# Patient Record
Sex: Female | Born: 1935 | Race: White | Hispanic: No | Smoking: Never smoker
Health system: Southern US, Community
[De-identification: ages and names within clinical notes are randomized; demographics above are authoritative.]

## PROBLEM LIST (undated history)

## (undated) DIAGNOSIS — N183 Chronic kidney disease, stage 3 unspecified: Secondary | ICD-10-CM

## (undated) DIAGNOSIS — I10 Essential (primary) hypertension: Secondary | ICD-10-CM

## (undated) DIAGNOSIS — F0390 Unspecified dementia without behavioral disturbance: Secondary | ICD-10-CM

## (undated) DIAGNOSIS — N3281 Overactive bladder: Secondary | ICD-10-CM

## (undated) DIAGNOSIS — I5022 Chronic systolic (congestive) heart failure: Secondary | ICD-10-CM

## (undated) DIAGNOSIS — M1990 Unspecified osteoarthritis, unspecified site: Secondary | ICD-10-CM

## (undated) DIAGNOSIS — E039 Hypothyroidism, unspecified: Secondary | ICD-10-CM

---

## 2006-01-24 ENCOUNTER — Ambulatory Visit: Payer: Self-pay | Admitting: Ophthalmology

## 2006-03-16 ENCOUNTER — Ambulatory Visit: Payer: Self-pay | Admitting: Unknown Physician Specialty

## 2006-08-11 ENCOUNTER — Ambulatory Visit: Payer: Self-pay | Admitting: Unknown Physician Specialty

## 2007-08-17 ENCOUNTER — Ambulatory Visit: Payer: Self-pay | Admitting: Unknown Physician Specialty

## 2007-11-24 ENCOUNTER — Ambulatory Visit: Payer: Self-pay | Admitting: Unknown Physician Specialty

## 2008-03-05 ENCOUNTER — Ambulatory Visit: Payer: Self-pay | Admitting: Psychology

## 2008-03-05 ENCOUNTER — Encounter: Admission: RE | Admit: 2008-03-05 | Discharge: 2008-03-05 | Payer: Self-pay | Admitting: Neurology

## 2008-11-26 ENCOUNTER — Ambulatory Visit: Payer: Self-pay | Admitting: Unknown Physician Specialty

## 2009-10-07 ENCOUNTER — Encounter: Payer: Self-pay | Admitting: Otolaryngology

## 2009-10-11 ENCOUNTER — Encounter: Payer: Self-pay | Admitting: Otolaryngology

## 2009-11-11 ENCOUNTER — Encounter: Payer: Self-pay | Admitting: Otolaryngology

## 2009-11-28 ENCOUNTER — Ambulatory Visit: Payer: Self-pay | Admitting: Unknown Physician Specialty

## 2009-12-09 ENCOUNTER — Encounter: Payer: Self-pay | Admitting: Otolaryngology

## 2010-01-09 ENCOUNTER — Encounter: Payer: Self-pay | Admitting: Otolaryngology

## 2010-08-06 ENCOUNTER — Ambulatory Visit: Payer: Self-pay | Admitting: Unknown Physician Specialty

## 2010-12-01 ENCOUNTER — Ambulatory Visit: Payer: Self-pay | Admitting: Unknown Physician Specialty

## 2011-12-07 ENCOUNTER — Ambulatory Visit: Payer: Self-pay | Admitting: Unknown Physician Specialty

## 2011-12-08 ENCOUNTER — Ambulatory Visit: Payer: Self-pay | Admitting: Unknown Physician Specialty

## 2012-01-30 ENCOUNTER — Observation Stay: Payer: Self-pay | Admitting: Internal Medicine

## 2012-01-30 LAB — BASIC METABOLIC PANEL
BUN: 12 mg/dL (ref 7–18)
Calcium, Total: 9 mg/dL (ref 8.5–10.1)
Co2: 30 mmol/L (ref 21–32)
EGFR (Non-African Amer.): 56 — ABNORMAL LOW
Osmolality: 289 (ref 275–301)
Potassium: 3.9 mmol/L (ref 3.5–5.1)
Sodium: 145 mmol/L (ref 136–145)

## 2012-01-30 LAB — CBC WITH DIFFERENTIAL/PLATELET
Basophil #: 0.1 10*3/uL (ref 0.0–0.1)
Basophil %: 0.4 %
Eosinophil #: 0.1 10*3/uL (ref 0.0–0.7)
Eosinophil %: 1 %
MCHC: 32.2 g/dL (ref 32.0–36.0)
Monocyte %: 2.4 %
Neutrophil %: 85.5 %
RDW: 13.8 % (ref 11.5–14.5)
WBC: 11.8 10*3/uL — ABNORMAL HIGH (ref 3.6–11.0)

## 2012-01-31 LAB — BASIC METABOLIC PANEL
BUN: 14 mg/dL (ref 7–18)
Calcium, Total: 8.5 mg/dL (ref 8.5–10.1)
Chloride: 106 mmol/L (ref 98–107)
Co2: 27 mmol/L (ref 21–32)
Creatinine: 1.09 mg/dL (ref 0.60–1.30)
EGFR (Non-African Amer.): 49 — ABNORMAL LOW
Potassium: 4.1 mmol/L (ref 3.5–5.1)
Sodium: 141 mmol/L (ref 136–145)

## 2012-01-31 LAB — URINALYSIS, COMPLETE
Bilirubin,UR: NEGATIVE
Nitrite: NEGATIVE
Ph: 5 (ref 4.5–8.0)
Protein: NEGATIVE
RBC,UR: 21 /HPF (ref 0–5)
Squamous Epithelial: 2

## 2012-01-31 LAB — CBC WITH DIFFERENTIAL/PLATELET
Basophil #: 0 10*3/uL (ref 0.0–0.1)
Basophil %: 0.3 %
HGB: 13.4 g/dL (ref 12.0–16.0)
Lymphocyte #: 1.3 10*3/uL (ref 1.0–3.6)
MCHC: 32.5 g/dL (ref 32.0–36.0)
MCV: 85 fL (ref 80–100)
Monocyte #: 0.5 x10 3/mm (ref 0.2–0.9)
Monocyte %: 3.9 %
Neutrophil #: 11.3 10*3/uL — ABNORMAL HIGH (ref 1.4–6.5)
Neutrophil %: 84.5 %
RBC: 4.87 10*6/uL (ref 3.80–5.20)
RDW: 14.1 % (ref 11.5–14.5)
WBC: 13.3 10*3/uL — ABNORMAL HIGH (ref 3.6–11.0)

## 2012-02-01 ENCOUNTER — Encounter: Payer: Self-pay | Admitting: Internal Medicine

## 2012-02-01 ENCOUNTER — Inpatient Hospital Stay: Payer: Self-pay | Admitting: *Deleted

## 2012-02-01 LAB — BASIC METABOLIC PANEL
Anion Gap: 9 (ref 7–16)
BUN: 9 mg/dL (ref 7–18)
Calcium, Total: 8.4 mg/dL — ABNORMAL LOW (ref 8.5–10.1)
Creatinine: 0.89 mg/dL (ref 0.60–1.30)
EGFR (African American): >60
EGFR (Non-African Amer.): >60
Glucose: 121 mg/dL — ABNORMAL HIGH (ref 65–99)
Osmolality: 277 (ref 275–301)

## 2012-02-01 LAB — PRO B NATRIURETIC PEPTIDE: B-Type Natriuretic Peptide: 609 pg/mL — ABNORMAL HIGH (ref 0–450)

## 2012-02-01 LAB — CBC
MCH: 27.7 pg (ref 26.0–34.0)
MCV: 86 fL (ref 80–100)
RBC: 4.68 10*6/uL (ref 3.80–5.20)
WBC: 11.2 10*3/uL — ABNORMAL HIGH (ref 3.6–11.0)

## 2012-02-02 DIAGNOSIS — I369 Nonrheumatic tricuspid valve disorder, unspecified: Secondary | ICD-10-CM

## 2012-02-02 LAB — BASIC METABOLIC PANEL
BUN: 9 mg/dL (ref 7–18)
Creatinine: 0.8 mg/dL (ref 0.60–1.30)
EGFR (African American): >60
EGFR (Non-African Amer.): >60
Osmolality: 277 (ref 275–301)
Sodium: 139 mmol/L (ref 136–145)

## 2012-02-02 LAB — CBC WITH DIFFERENTIAL/PLATELET
Basophil #: 0.1 10*3/uL (ref 0.0–0.1)
Basophil %: 0.6 %
Eosinophil #: 0.1 10*3/uL (ref 0.0–0.7)
HCT: 37.5 % (ref 35.0–47.0)
Lymphocyte #: 1 10*3/uL (ref 1.0–3.6)
MCH: 28 pg (ref 26.0–34.0)
MCHC: 33 g/dL (ref 32.0–36.0)
Monocyte #: 0.7 x10 3/mm (ref 0.2–0.9)
Neutrophil #: 8.6 10*3/uL — ABNORMAL HIGH (ref 1.4–6.5)
Platelet: 113 10*3/uL — ABNORMAL LOW (ref 150–440)
RBC: 4.42 10*6/uL (ref 3.80–5.20)
RDW: 14.1 % (ref 11.5–14.5)
WBC: 10.4 10*3/uL (ref 3.6–11.0)

## 2012-02-02 LAB — URINE CULTURE

## 2012-02-02 LAB — TROPONIN I: Troponin-I: <0.02 ng/mL

## 2012-02-06 LAB — BASIC METABOLIC PANEL
BUN: 16 mg/dL (ref 7–18)
Co2: 29 mmol/L (ref 21–32)
Creatinine: 0.95 mg/dL (ref 0.60–1.30)
EGFR (African American): >60
EGFR (Non-African Amer.): 57 — ABNORMAL LOW
Glucose: 103 mg/dL — ABNORMAL HIGH (ref 65–99)
Osmolality: 277 (ref 275–301)
Potassium: 3.7 mmol/L (ref 3.5–5.1)

## 2012-02-07 LAB — CULTURE, BLOOD (SINGLE)

## 2012-02-09 ENCOUNTER — Encounter: Payer: Self-pay | Admitting: Internal Medicine

## 2012-03-11 ENCOUNTER — Encounter: Payer: Self-pay | Admitting: Internal Medicine

## 2012-03-24 ENCOUNTER — Ambulatory Visit: Payer: Self-pay | Admitting: Internal Medicine

## 2012-06-09 ENCOUNTER — Telehealth: Payer: Self-pay | Admitting: Internal Medicine

## 2012-06-09 NOTE — Telephone Encounter (Signed)
Pt wants to know status of her sister Toney Reil at [facility]. Has called 4 times this morning. Would like a call from Dr. Alphonsus Sias at [phone].

## 2012-06-09 NOTE — Telephone Encounter (Signed)
Thanks

## 2012-06-09 NOTE — Telephone Encounter (Signed)
Reached her on cell number given by her husband [phone] Discussed sister Scarlette Calico' condition Improved but still confused Likely not safe for her to return home without supervision Asked that she check with social worker [employer]

## 2012-07-07 ENCOUNTER — Ambulatory Visit: Payer: Self-pay | Admitting: Internal Medicine

## 2012-08-10 ENCOUNTER — Ambulatory Visit: Payer: Self-pay | Admitting: Internal Medicine

## 2013-01-26 ENCOUNTER — Ambulatory Visit: Payer: Self-pay | Admitting: Internal Medicine

## 2013-11-20 ENCOUNTER — Emergency Department: Payer: Self-pay | Admitting: Emergency Medicine

## 2013-11-20 LAB — URINALYSIS, COMPLETE
BILIRUBIN, UR: NEGATIVE
Glucose,UR: NEGATIVE mg/dL (ref 0–75)
Hyaline Cast: 18
LEUKOCYTE ESTERASE: NEGATIVE
Nitrite: NEGATIVE
PH: 5 (ref 4.5–8.0)
RBC,UR: 20 /HPF (ref 0–5)
Specific Gravity: 1.019 (ref 1.003–1.030)
WBC UR: 6 /HPF (ref 0–5)

## 2013-11-20 LAB — COMPREHENSIVE METABOLIC PANEL
ALBUMIN: 3.9 g/dL (ref 3.4–5.0)
ALK PHOS: 84 U/L
Anion Gap: 8 (ref 7–16)
BILIRUBIN TOTAL: 0.5 mg/dL (ref 0.2–1.0)
BUN: 12 mg/dL (ref 7–18)
CHLORIDE: 103 mmol/L (ref 98–107)
CREATININE: 1.28 mg/dL (ref 0.60–1.30)
Calcium, Total: 9.3 mg/dL (ref 8.5–10.1)
Co2: 25 mmol/L (ref 21–32)
EGFR (African American): 46 — ABNORMAL LOW
GFR CALC NON AF AMER: 39 — AB
GLUCOSE: 140 mg/dL — AB (ref 65–99)
Osmolality: 274 (ref 275–301)
Potassium: 3.8 mmol/L (ref 3.5–5.1)
SGOT(AST): 34 U/L (ref 15–37)
SGPT (ALT): 27 U/L (ref 12–78)
SODIUM: 136 mmol/L (ref 136–145)
TOTAL PROTEIN: 7.9 g/dL (ref 6.4–8.2)

## 2013-11-20 LAB — CBC
HCT: 44.8 % (ref 35.0–47.0)
HGB: 15 g/dL (ref 12.0–16.0)
MCH: 28.9 pg (ref 26.0–34.0)
MCHC: 33.6 g/dL (ref 32.0–36.0)
MCV: 86 fL (ref 80–100)
PLATELETS: 183 10*3/uL (ref 150–440)
RBC: 5.21 10*6/uL — ABNORMAL HIGH (ref 3.80–5.20)
RDW: 13.5 % (ref 11.5–14.5)
WBC: 8.2 10*3/uL (ref 3.6–11.0)

## 2013-11-20 LAB — TROPONIN I: Troponin-I: <0.02 ng/mL

## 2013-11-25 ENCOUNTER — Emergency Department: Payer: Self-pay | Admitting: Emergency Medicine

## 2013-12-03 ENCOUNTER — Ambulatory Visit: Payer: Self-pay | Admitting: Internal Medicine

## 2014-01-03 ENCOUNTER — Ambulatory Visit: Payer: Self-pay | Admitting: Neurology

## 2014-01-07 ENCOUNTER — Other Ambulatory Visit (HOSPITAL_COMMUNITY): Payer: Self-pay | Admitting: Neurosurgery

## 2014-01-07 DIAGNOSIS — G912 (Idiopathic) normal pressure hydrocephalus: Secondary | ICD-10-CM

## 2014-01-08 ENCOUNTER — Other Ambulatory Visit: Payer: Self-pay | Admitting: Radiology

## 2014-01-09 ENCOUNTER — Ambulatory Visit (HOSPITAL_COMMUNITY)
Admission: RE | Admit: 2014-01-09 | Discharge: 2014-01-09 | Disposition: A | Discharge status: Home / Self Care | Payer: Medicare Other | Source: Ambulatory Visit | Attending: Neurosurgery | Admitting: Neurosurgery

## 2014-01-09 ENCOUNTER — Encounter (HOSPITAL_COMMUNITY): Payer: Self-pay

## 2014-01-09 DIAGNOSIS — G912 (Idiopathic) normal pressure hydrocephalus: Secondary | ICD-10-CM

## 2014-01-09 DIAGNOSIS — R262 Difficulty in walking, not elsewhere classified: Secondary | ICD-10-CM | POA: Insufficient documentation

## 2014-01-09 MED ORDER — ACETAMINOPHEN 325 MG PO TABS
650.0000 mg | ORAL_TABLET | ORAL | Status: DC | PRN
Start: 2014-01-09 — End: 2014-01-10

## 2014-01-09 NOTE — Discharge Instructions (Signed)

## 2014-01-09 NOTE — Procedures (Signed)
Lumbar puncture performed at L3-4 without complication. Please detailed description in Radiology reporting

## 2014-01-11 ENCOUNTER — Inpatient Hospital Stay: Payer: Self-pay | Admitting: Internal Medicine

## 2014-01-11 LAB — HEPATIC FUNCTION PANEL A (ARMC)
ALBUMIN: 2.8 g/dL — AB (ref 3.4–5.0)
ALK PHOS: 132 U/L — AB
BILIRUBIN TOTAL: 0.4 mg/dL (ref 0.2–1.0)
Bilirubin, Direct: <0.1 mg/dL (ref 0.00–0.20)
SGOT(AST): 19 U/L (ref 15–37)
SGPT (ALT): 13 U/L (ref 12–78)
Total Protein: 7.1 g/dL (ref 6.4–8.2)

## 2014-01-11 LAB — CBC WITH DIFFERENTIAL/PLATELET
BASOS ABS: 0 10*3/uL (ref 0.0–0.1)
BASOS PCT: 0.3 %
EOS PCT: 0.3 %
Eosinophil #: 0 10*3/uL (ref 0.0–0.7)
HCT: 35.6 % (ref 35.0–47.0)
HGB: 11.3 g/dL — ABNORMAL LOW (ref 12.0–16.0)
LYMPHS ABS: 0.3 10*3/uL — AB (ref 1.0–3.6)
Lymphocyte %: 2.4 %
MCH: 26.1 pg (ref 26.0–34.0)
MCHC: 31.7 g/dL — ABNORMAL LOW (ref 32.0–36.0)
MCV: 82 fL (ref 80–100)
MONO ABS: 0.2 x10 3/mm (ref 0.2–0.9)
MONOS PCT: 1.3 %
Neutrophil #: 12.8 10*3/uL — ABNORMAL HIGH (ref 1.4–6.5)
Neutrophil %: 95.7 %
Platelet: 389 10*3/uL (ref 150–440)
RBC: 4.32 10*6/uL (ref 3.80–5.20)
RDW: 14.3 % (ref 11.5–14.5)
WBC: 13.4 10*3/uL — ABNORMAL HIGH (ref 3.6–11.0)

## 2014-01-11 LAB — BASIC METABOLIC PANEL
ANION GAP: 10 (ref 7–16)
BUN: 13 mg/dL (ref 7–18)
CREATININE: 1.42 mg/dL — AB (ref 0.60–1.30)
Calcium, Total: 8.6 mg/dL (ref 8.5–10.1)
Chloride: 106 mmol/L (ref 98–107)
Co2: 24 mmol/L (ref 21–32)
EGFR (African American): 40 — ABNORMAL LOW
GFR CALC NON AF AMER: 35 — AB
Glucose: 116 mg/dL — ABNORMAL HIGH (ref 65–99)
Osmolality: 280 (ref 275–301)
Potassium: 3.8 mmol/L (ref 3.5–5.1)
Sodium: 140 mmol/L (ref 136–145)

## 2014-01-11 LAB — URINALYSIS, COMPLETE
BILIRUBIN, UR: NEGATIVE
Bacteria: NONE SEEN
Glucose,UR: NEGATIVE mg/dL (ref 0–75)
KETONE: NEGATIVE
Nitrite: POSITIVE
Ph: 5 (ref 4.5–8.0)
Protein: 30
RBC,UR: 29 /HPF (ref 0–5)
Specific Gravity: 1.012 (ref 1.003–1.030)
Squamous Epithelial: NONE SEEN

## 2014-01-11 LAB — LIPASE, BLOOD: LIPASE: 101 U/L (ref 73–393)

## 2014-01-11 LAB — TROPONIN I

## 2014-01-12 ENCOUNTER — Ambulatory Visit: Payer: Self-pay | Admitting: Urology

## 2014-01-12 LAB — CBC WITH DIFFERENTIAL/PLATELET
BASOS ABS: 0.1 10*3/uL (ref 0.0–0.1)
Basophil %: 0.4 %
EOS PCT: 0.8 %
Eosinophil #: 0.1 10*3/uL (ref 0.0–0.7)
HCT: 31.3 % — ABNORMAL LOW (ref 35.0–47.0)
HGB: 10 g/dL — ABNORMAL LOW (ref 12.0–16.0)
LYMPHS ABS: 1.3 10*3/uL (ref 1.0–3.6)
LYMPHS PCT: 6.8 %
MCH: 26.4 pg (ref 26.0–34.0)
MCHC: 31.9 g/dL — ABNORMAL LOW (ref 32.0–36.0)
MCV: 83 fL (ref 80–100)
MONO ABS: 0.7 x10 3/mm (ref 0.2–0.9)
Monocyte %: 3.9 %
Neutrophil #: 16.7 10*3/uL — ABNORMAL HIGH (ref 1.4–6.5)
Neutrophil %: 88.1 %
Platelet: 378 10*3/uL (ref 150–440)
RBC: 3.78 10*6/uL — AB (ref 3.80–5.20)
RDW: 14.5 % (ref 11.5–14.5)
WBC: 19 10*3/uL — ABNORMAL HIGH (ref 3.6–11.0)

## 2014-01-13 LAB — BASIC METABOLIC PANEL
Anion Gap: 7 (ref 7–16)
BUN: 14 mg/dL (ref 7–18)
CALCIUM: 7.6 mg/dL — AB (ref 8.5–10.1)
Chloride: 108 mmol/L — ABNORMAL HIGH (ref 98–107)
Co2: 26 mmol/L (ref 21–32)
Creatinine: 1.55 mg/dL — ABNORMAL HIGH (ref 0.60–1.30)
EGFR (African American): 36 — ABNORMAL LOW
GFR CALC NON AF AMER: 31 — AB
Glucose: 81 mg/dL (ref 65–99)
Osmolality: 281 (ref 275–301)
POTASSIUM: 3 mmol/L — AB (ref 3.5–5.1)
Sodium: 141 mmol/L (ref 136–145)

## 2014-01-13 LAB — CBC WITH DIFFERENTIAL/PLATELET
BASOS ABS: 0.1 10*3/uL (ref 0.0–0.1)
Basophil %: 0.3 %
EOS PCT: 2.1 %
Eosinophil #: 0.3 10*3/uL (ref 0.0–0.7)
HCT: 28.3 % — ABNORMAL LOW (ref 35.0–47.0)
HGB: 9.2 g/dL — AB (ref 12.0–16.0)
LYMPHS ABS: 1.6 10*3/uL (ref 1.0–3.6)
Lymphocyte %: 10.2 %
MCH: 26.4 pg (ref 26.0–34.0)
MCHC: 32.5 g/dL (ref 32.0–36.0)
MCV: 81 fL (ref 80–100)
MONO ABS: 0.8 x10 3/mm (ref 0.2–0.9)
Monocyte %: 5.3 %
NEUTROS ABS: 12.8 10*3/uL — AB (ref 1.4–6.5)
NEUTROS PCT: 82.1 %
PLATELETS: 317 10*3/uL (ref 150–440)
RBC: 3.48 10*6/uL — ABNORMAL LOW (ref 3.80–5.20)
RDW: 14.7 % — ABNORMAL HIGH (ref 11.5–14.5)
WBC: 15.6 10*3/uL — AB (ref 3.6–11.0)

## 2014-01-13 LAB — CLOSTRIDIUM DIFFICILE(ARMC)

## 2014-01-13 LAB — URINE CULTURE

## 2014-01-14 LAB — CBC WITH DIFFERENTIAL/PLATELET
BASOS PCT: 1.1 %
Basophil #: 0.1 10*3/uL (ref 0.0–0.1)
EOS PCT: 4.2 %
Eosinophil #: 0.5 10*3/uL (ref 0.0–0.7)
HCT: 31.1 % — ABNORMAL LOW (ref 35.0–47.0)
HGB: 10.1 g/dL — AB (ref 12.0–16.0)
LYMPHS PCT: 11.2 %
Lymphocyte #: 1.5 10*3/uL (ref 1.0–3.6)
MCH: 26.7 pg (ref 26.0–34.0)
MCHC: 32.6 g/dL (ref 32.0–36.0)
MCV: 82 fL (ref 80–100)
MONO ABS: 0.8 x10 3/mm (ref 0.2–0.9)
Monocyte %: 6.2 %
NEUTROS PCT: 77.3 %
Neutrophil #: 10.1 10*3/uL — ABNORMAL HIGH (ref 1.4–6.5)
PLATELETS: 349 10*3/uL (ref 150–440)
RBC: 3.8 10*6/uL (ref 3.80–5.20)
RDW: 14.8 % — AB (ref 11.5–14.5)
WBC: 13.1 10*3/uL — ABNORMAL HIGH (ref 3.6–11.0)

## 2014-01-14 LAB — BASIC METABOLIC PANEL
Anion Gap: 5 — ABNORMAL LOW (ref 7–16)
BUN: 7 mg/dL (ref 7–18)
CREATININE: 1.07 mg/dL (ref 0.60–1.30)
Calcium, Total: 7.6 mg/dL — ABNORMAL LOW (ref 8.5–10.1)
Chloride: 108 mmol/L — ABNORMAL HIGH (ref 98–107)
Co2: 26 mmol/L (ref 21–32)
EGFR (African American): 57 — ABNORMAL LOW
GFR CALC NON AF AMER: 49 — AB
GLUCOSE: 98 mg/dL (ref 65–99)
OSMOLALITY: 275 (ref 275–301)
POTASSIUM: 2.9 mmol/L — AB (ref 3.5–5.1)
SODIUM: 139 mmol/L (ref 136–145)

## 2014-01-15 LAB — BASIC METABOLIC PANEL
ANION GAP: 6 — AB (ref 7–16)
BUN: 4 mg/dL — ABNORMAL LOW (ref 7–18)
CHLORIDE: 108 mmol/L — AB (ref 98–107)
CREATININE: 0.96 mg/dL (ref 0.60–1.30)
Calcium, Total: 7.9 mg/dL — ABNORMAL LOW (ref 8.5–10.1)
Co2: 25 mmol/L (ref 21–32)
EGFR (African American): >60
GFR CALC NON AF AMER: 56 — AB
Glucose: 101 mg/dL — ABNORMAL HIGH (ref 65–99)
Osmolality: 275 (ref 275–301)
Potassium: 3.5 mmol/L (ref 3.5–5.1)
SODIUM: 139 mmol/L (ref 136–145)

## 2014-01-16 LAB — CULTURE, BLOOD (SINGLE)

## 2014-01-17 ENCOUNTER — Encounter: Payer: Self-pay | Admitting: Internal Medicine

## 2014-01-17 LAB — CBC WITH DIFFERENTIAL/PLATELET
BASOS ABS: 0.2 10*3/uL — AB (ref 0.0–0.1)
BASOS PCT: 0.8 %
EOS ABS: 0.5 10*3/uL (ref 0.0–0.7)
Eosinophil %: 2.7 %
HCT: 36.8 % (ref 35.0–47.0)
HGB: 11.8 g/dL — AB (ref 12.0–16.0)
LYMPHS ABS: 2.2 10*3/uL (ref 1.0–3.6)
Lymphocyte %: 11.6 %
MCH: 25.9 pg — AB (ref 26.0–34.0)
MCHC: 32.2 g/dL (ref 32.0–36.0)
MCV: 81 fL (ref 80–100)
Monocyte #: 1.1 x10 3/mm — ABNORMAL HIGH (ref 0.2–0.9)
Monocyte %: 5.7 %
Neutrophil #: 15.2 10*3/uL — ABNORMAL HIGH (ref 1.4–6.5)
Neutrophil %: 79.2 %
PLATELETS: 438 10*3/uL (ref 150–440)
RBC: 4.57 10*6/uL (ref 3.80–5.20)
RDW: 14.7 % — ABNORMAL HIGH (ref 11.5–14.5)
WBC: 19.2 10*3/uL — ABNORMAL HIGH (ref 3.6–11.0)

## 2014-01-17 LAB — BASIC METABOLIC PANEL
Anion Gap: 5 — ABNORMAL LOW (ref 7–16)
BUN: 7 mg/dL (ref 7–18)
Calcium, Total: 8.7 mg/dL (ref 8.5–10.1)
Chloride: 106 mmol/L (ref 98–107)
Co2: 26 mmol/L (ref 21–32)
Creatinine: 1.23 mg/dL (ref 0.60–1.30)
EGFR (African American): 48 — ABNORMAL LOW
GFR CALC NON AF AMER: 41 — AB
GLUCOSE: 132 mg/dL — AB (ref 65–99)
Osmolality: 274 (ref 275–301)
POTASSIUM: 3.5 mmol/L (ref 3.5–5.1)
Sodium: 137 mmol/L (ref 136–145)

## 2014-01-28 LAB — URINALYSIS, COMPLETE
Bacteria: NONE SEEN
Bilirubin,UR: NEGATIVE
Glucose,UR: NEGATIVE mg/dL (ref 0–75)
KETONE: NEGATIVE
Nitrite: NEGATIVE
Ph: 5 (ref 4.5–8.0)
Protein: 100
RBC,UR: 1215 /HPF (ref 0–5)
SPECIFIC GRAVITY: 1.018 (ref 1.003–1.030)
Squamous Epithelial: NONE SEEN

## 2014-01-30 LAB — URINE CULTURE

## 2014-03-14 ENCOUNTER — Encounter: Payer: Self-pay | Admitting: Internal Medicine

## 2014-04-10 ENCOUNTER — Encounter: Payer: Self-pay | Admitting: Internal Medicine

## 2014-05-11 ENCOUNTER — Encounter: Payer: Self-pay | Admitting: Internal Medicine

## 2014-05-16 ENCOUNTER — Inpatient Hospital Stay: Payer: Self-pay | Admitting: Internal Medicine

## 2014-05-16 LAB — COMPREHENSIVE METABOLIC PANEL
ALK PHOS: 101 U/L
Albumin: 3.4 g/dL (ref 3.4–5.0)
Anion Gap: 4 — ABNORMAL LOW (ref 7–16)
BUN: 16 mg/dL (ref 7–18)
Bilirubin,Total: 0.6 mg/dL (ref 0.2–1.0)
CALCIUM: 8.8 mg/dL (ref 8.5–10.1)
Chloride: 107 mmol/L (ref 98–107)
Co2: 29 mmol/L (ref 21–32)
Creatinine: 1.07 mg/dL (ref 0.60–1.30)
EGFR (African American): 57 — ABNORMAL LOW
EGFR (Non-African Amer.): 49 — ABNORMAL LOW
Glucose: 92 mg/dL (ref 65–99)
Osmolality: 280 (ref 275–301)
POTASSIUM: 3.9 mmol/L (ref 3.5–5.1)
SGOT(AST): 21 U/L (ref 15–37)
SGPT (ALT): 18 U/L
SODIUM: 140 mmol/L (ref 136–145)
Total Protein: 7.2 g/dL (ref 6.4–8.2)

## 2014-05-16 LAB — URINALYSIS, COMPLETE
Bilirubin,UR: NEGATIVE
GLUCOSE, UR: NEGATIVE mg/dL (ref 0–75)
Ketone: NEGATIVE
Nitrite: POSITIVE
PROTEIN: NEGATIVE
Ph: 6 (ref 4.5–8.0)
RBC,UR: 4 /HPF (ref 0–5)
Specific Gravity: 1.01 (ref 1.003–1.030)
Squamous Epithelial: <1
WBC UR: 21 /HPF (ref 0–5)

## 2014-05-16 LAB — CBC
HCT: 42.3 % (ref 35.0–47.0)
HGB: 13.7 g/dL (ref 12.0–16.0)
MCH: 26.6 pg (ref 26.0–34.0)
MCHC: 32.4 g/dL (ref 32.0–36.0)
MCV: 82 fL (ref 80–100)
Platelet: 215 10*3/uL (ref 150–440)
RBC: 5.14 10*6/uL (ref 3.80–5.20)
RDW: 14.7 % — ABNORMAL HIGH (ref 11.5–14.5)
WBC: 8.1 10*3/uL (ref 3.6–11.0)

## 2014-05-16 LAB — APTT: ACTIVATED PTT: 30.8 s (ref 23.6–35.9)

## 2014-05-16 LAB — PROTIME-INR
INR: 1.1
Prothrombin Time: 13.6 secs (ref 11.5–14.7)

## 2014-05-16 LAB — TROPONIN I: Troponin-I: <0.02 ng/mL

## 2014-05-17 LAB — CBC WITH DIFFERENTIAL/PLATELET
BASOS ABS: 0.1 10*3/uL (ref 0.0–0.1)
Basophil %: 0.7 %
Eosinophil #: 0.4 10*3/uL (ref 0.0–0.7)
Eosinophil %: 3.7 %
HCT: 40.8 % (ref 35.0–47.0)
HGB: 13.3 g/dL (ref 12.0–16.0)
Lymphocyte #: 2.7 10*3/uL (ref 1.0–3.6)
Lymphocyte %: 24.3 %
MCH: 26.7 pg (ref 26.0–34.0)
MCHC: 32.7 g/dL (ref 32.0–36.0)
MCV: 82 fL (ref 80–100)
Monocyte #: 0.7 x10 3/mm (ref 0.2–0.9)
Monocyte %: 6.4 %
Neutrophil #: 7.3 10*3/uL — ABNORMAL HIGH (ref 1.4–6.5)
Neutrophil %: 64.9 %
PLATELETS: 201 10*3/uL (ref 150–440)
RBC: 4.99 10*6/uL (ref 3.80–5.20)
RDW: 14.7 % — ABNORMAL HIGH (ref 11.5–14.5)
WBC: 11.3 10*3/uL — ABNORMAL HIGH (ref 3.6–11.0)

## 2014-05-17 LAB — BASIC METABOLIC PANEL
Anion Gap: 11 (ref 7–16)
BUN: 15 mg/dL (ref 7–18)
CHLORIDE: 105 mmol/L (ref 98–107)
CO2: 26 mmol/L (ref 21–32)
Calcium, Total: 8.6 mg/dL (ref 8.5–10.1)
Creatinine: 1.16 mg/dL (ref 0.60–1.30)
EGFR (African American): 51 — ABNORMAL LOW
EGFR (Non-African Amer.): 44 — ABNORMAL LOW
Glucose: 100 mg/dL — ABNORMAL HIGH (ref 65–99)
Osmolality: 284 (ref 275–301)
POTASSIUM: 3.5 mmol/L (ref 3.5–5.1)
Sodium: 142 mmol/L (ref 136–145)

## 2014-05-17 LAB — TROPONIN I

## 2014-05-18 LAB — COMPREHENSIVE METABOLIC PANEL
ALBUMIN: 3 g/dL — AB (ref 3.4–5.0)
ALK PHOS: 91 U/L
ALT: 16 U/L
ANION GAP: 7 (ref 7–16)
AST: 17 U/L (ref 15–37)
BUN: 16 mg/dL (ref 7–18)
Bilirubin,Total: 0.6 mg/dL (ref 0.2–1.0)
CHLORIDE: 106 mmol/L (ref 98–107)
CREATININE: 1.16 mg/dL (ref 0.60–1.30)
Calcium, Total: 8.2 mg/dL — ABNORMAL LOW (ref 8.5–10.1)
Co2: 26 mmol/L (ref 21–32)
EGFR (African American): 51 — ABNORMAL LOW
GFR CALC NON AF AMER: 44 — AB
Glucose: 112 mg/dL — ABNORMAL HIGH (ref 65–99)
OSMOLALITY: 279 (ref 275–301)
Potassium: 3.4 mmol/L — ABNORMAL LOW (ref 3.5–5.1)
Sodium: 139 mmol/L (ref 136–145)
Total Protein: 6.6 g/dL (ref 6.4–8.2)

## 2014-05-18 LAB — CBC WITH DIFFERENTIAL/PLATELET
BASOS ABS: 0.1 10*3/uL (ref 0.0–0.1)
Basophil %: 0.5 %
Eosinophil #: 0.2 10*3/uL (ref 0.0–0.7)
Eosinophil %: 2.4 %
HCT: 40.8 % (ref 35.0–47.0)
HGB: 13.4 g/dL (ref 12.0–16.0)
Lymphocyte #: 2.3 10*3/uL (ref 1.0–3.6)
Lymphocyte %: 23.4 %
MCH: 27.2 pg (ref 26.0–34.0)
MCHC: 32.9 g/dL (ref 32.0–36.0)
MCV: 83 fL (ref 80–100)
Monocyte #: 0.8 x10 3/mm (ref 0.2–0.9)
Monocyte %: 7.9 %
Neutrophil #: 6.5 10*3/uL (ref 1.4–6.5)
Neutrophil %: 65.8 %
Platelet: 185 10*3/uL (ref 150–440)
RBC: 4.93 10*6/uL (ref 3.80–5.20)
RDW: 14.5 % (ref 11.5–14.5)
WBC: 9.8 10*3/uL (ref 3.6–11.0)

## 2014-05-21 ENCOUNTER — Observation Stay: Payer: Self-pay | Admitting: Internal Medicine

## 2014-05-21 LAB — CBC WITH DIFFERENTIAL/PLATELET
Basophil #: 0.1 10*3/uL (ref 0.0–0.1)
Basophil %: 0.7 %
Eosinophil #: 0.4 10*3/uL (ref 0.0–0.7)
Eosinophil %: 3.6 %
HCT: 40.3 % (ref 35.0–47.0)
HGB: 12.8 g/dL (ref 12.0–16.0)
Lymphocyte #: 1.9 10*3/uL (ref 1.0–3.6)
Lymphocyte %: 18.6 %
MCH: 26.6 pg (ref 26.0–34.0)
MCHC: 31.8 g/dL — ABNORMAL LOW (ref 32.0–36.0)
MCV: 84 fL (ref 80–100)
Monocyte #: 0.9 x10 3/mm (ref 0.2–0.9)
Monocyte %: 8.5 %
Neutrophil #: 6.9 10*3/uL — ABNORMAL HIGH (ref 1.4–6.5)
Neutrophil %: 68.6 %
Platelet: 176 10*3/uL (ref 150–440)
RBC: 4.82 10*6/uL (ref 3.80–5.20)
RDW: 14.1 % (ref 11.5–14.5)
WBC: 10 10*3/uL (ref 3.6–11.0)

## 2014-05-21 LAB — BASIC METABOLIC PANEL
Anion Gap: 6 — ABNORMAL LOW (ref 7–16)
BUN: 23 mg/dL — ABNORMAL HIGH (ref 7–18)
CHLORIDE: 110 mmol/L — AB (ref 98–107)
Calcium, Total: 8.1 mg/dL — ABNORMAL LOW (ref 8.5–10.1)
Co2: 27 mmol/L (ref 21–32)
Creatinine: 1.17 mg/dL (ref 0.60–1.30)
EGFR (Non-African Amer.): 44 — ABNORMAL LOW
GFR CALC AF AMER: 51 — AB
Glucose: 82 mg/dL (ref 65–99)
Osmolality: 288 (ref 275–301)
Potassium: 4 mmol/L (ref 3.5–5.1)
SODIUM: 143 mmol/L (ref 136–145)

## 2014-05-21 LAB — TROPONIN I: Troponin-I: <0.02 ng/mL

## 2014-05-22 LAB — LIPID PANEL
Cholesterol: 123 mg/dL (ref 0–200)
HDL: 35 mg/dL — AB (ref 40–60)
LDL CHOLESTEROL, CALC: 65 mg/dL (ref 0–100)
Triglycerides: 116 mg/dL (ref 0–200)
VLDL Cholesterol, Calc: 23 mg/dL (ref 5–40)

## 2014-05-22 LAB — CBC WITH DIFFERENTIAL/PLATELET
BASOS ABS: 0.1 10*3/uL (ref 0.0–0.1)
BASOS PCT: 1.2 %
EOS PCT: 6.2 %
Eosinophil #: 0.5 10*3/uL (ref 0.0–0.7)
HCT: 36.6 % (ref 35.0–47.0)
HGB: 11.7 g/dL — AB (ref 12.0–16.0)
LYMPHS PCT: 31 %
Lymphocyte #: 2.5 10*3/uL (ref 1.0–3.6)
MCH: 26.6 pg (ref 26.0–34.0)
MCHC: 32 g/dL (ref 32.0–36.0)
MCV: 83 fL (ref 80–100)
Monocyte #: 0.8 x10 3/mm (ref 0.2–0.9)
Monocyte %: 9.6 %
Neutrophil #: 4.1 10*3/uL (ref 1.4–6.5)
Neutrophil %: 52 %
Platelet: 171 10*3/uL (ref 150–440)
RBC: 4.41 10*6/uL (ref 3.80–5.20)
RDW: 14.6 % — ABNORMAL HIGH (ref 11.5–14.5)
WBC: 8 10*3/uL (ref 3.6–11.0)

## 2014-05-22 LAB — BASIC METABOLIC PANEL
ANION GAP: 6 — AB (ref 7–16)
BUN: 21 mg/dL — AB (ref 7–18)
CALCIUM: 8 mg/dL — AB (ref 8.5–10.1)
CHLORIDE: 110 mmol/L — AB (ref 98–107)
CREATININE: 1.1 mg/dL (ref 0.60–1.30)
Co2: 28 mmol/L (ref 21–32)
EGFR (African American): 55 — ABNORMAL LOW
EGFR (Non-African Amer.): 47 — ABNORMAL LOW
Glucose: 94 mg/dL (ref 65–99)
OSMOLALITY: 290 (ref 275–301)
POTASSIUM: 3.9 mmol/L (ref 3.5–5.1)
Sodium: 144 mmol/L (ref 136–145)

## 2014-05-22 LAB — URINALYSIS, COMPLETE
BILIRUBIN, UR: NEGATIVE
Bacteria: NONE SEEN
Glucose,UR: NEGATIVE mg/dL (ref 0–75)
Hyaline Cast: 2
Ketone: NEGATIVE
Nitrite: NEGATIVE
PH: 5 (ref 4.5–8.0)
PROTEIN: NEGATIVE
Specific Gravity: 1.021 (ref 1.003–1.030)
WBC UR: NONE SEEN /HPF (ref 0–5)

## 2014-05-23 ENCOUNTER — Encounter: Payer: Self-pay | Admitting: Internal Medicine

## 2014-05-23 ENCOUNTER — Ambulatory Visit: Payer: Self-pay | Admitting: Neurology

## 2014-06-11 ENCOUNTER — Encounter: Payer: Self-pay | Admitting: Internal Medicine

## 2014-06-13 DIAGNOSIS — F0150 Vascular dementia without behavioral disturbance: Secondary | ICD-10-CM | POA: Insufficient documentation

## 2014-06-13 DIAGNOSIS — F0280 Dementia in other diseases classified elsewhere without behavioral disturbance: Secondary | ICD-10-CM | POA: Insufficient documentation

## 2015-02-01 NOTE — Discharge Summary (Signed)
PATIENT NAME:  Kerri [name], Kerri [name] MR#:  [id number] DATE OF BIRTH:  [birth year]  DATE OF ADMISSION:  05/21/2014 DATE OF DISCHARGE:  05/23/2014  DISCHARGE DIAGNOSES:  1.  Normal pressure hydrocephalus with recurrent syncope.  2.  Mild dementia.  3.  Urinary incontinence.  4.  Hyperlipidemia.   DISCHARGE MEDICATIONS: Pravastatin 20 mg at bedtime, citalopram 40 mg daily, calcium with vitamin D b.i.d., folate 1 mg daily, Aricept 5 mg at bedtime, Robaxin 500 mg t.i.d., Aleve 2 daily. Oxybutynin 5 mg daily, topical Voltaren daily.   REASON FOR ADMISSION: The patient is an 81 year old female who presents with recurrent syncope. Please see H and P for history of present illness, past medical history, and physical exam.   HOSPITAL COURSE: The patient was admitted. EEG was negative for epileptiform discharges and her pacemaker interrogation was normal. Neurology evaluation is pending. She will be transferred to skilled nursing with her instability with plans [facility] next [facility].    ____________________________ Danella PentonMark F. Apurva Reily, MD mfm:lt D: 05/23/2014 07:30:58 ET T: 05/23/2014 08:03:59 ET JOB#: [id number]  cc: Danella PentonMark F. Iolanda Folson, MD, <Dictator> Breigh Annett Sherlene ShamsF Demetry Bendickson MD ELECTRONICALLY SIGNED 05/23/2014 8:30

## 2015-02-01 NOTE — Consult Note (Signed)
PATIENT NAME:  Kerri [name], Kerri [name] MR#:  [id number] DATE OF BIRTH:  [birth year]  DATE OF CONSULTATION:  05/21/2014  REFERRING PHYSICIAN:   CONSULTING PHYSICIAN:  Lamar BlinksBruce J. Jonavan Vanhorn, MD  CONSULTING PHYSICIAN: Dr. Renae GlossWieting.   REASON FOR CONSULTATION: Syncope, hyperlipidemia, and normal pressure hydrocephalus.    CHIEF COMPLAINT: The patient passed out.   HISTORY OF PRESENT ILLNESS: This is an 81 year old female with normal pressure hydrocephalus with weakness and fatigue off and on over the last many years with classic symptoms. The patient recently has been admitted for episodes of syncopal episodes occurring with some clonic type movements. The patient has not hurt herself with these syncopal episodes, and there was some apparent bradycardia. The patient had a dual-chamber pacemaker placement which has worked fairly well and has not had these issues, although she had another syncopal episode for which she was walking into the bathroom and did go to the ground. She did not hurt herself at that time and she did have clonic type symptoms, but did retain her composure fairly quickly. The patient then was brought here with an EKG showing a pacemaker being normal. EKG with normal troponin and no evidence of hypotension. She is feeling well at this time.   REVIEW OF SYSTEMS: The remainder of the review of systems not easily obtainable due to patient's difficulty in conversation.   PAST MEDICAL HISTORY:  1.  Normal pressure hydrocephalus. 2.  Hyperlipidemia.  3.  Bradycardia.   FAMILY HISTORY: No family members with early onset of cardiovascular disease or hypertension.   SOCIAL HISTORY: The patient currently denies alcohol or tobacco use.   ALLERGIES: As listed.   MEDICATIONS: As listed.   PHYSICAL EXAMINATION: VITAL SIGNS: Blood pressure is 126/58 bilaterally. Heart rate is 70, upright, reclining, and regular.  GENERAL: She is a well-appearing female in no acute distress.  HEAD, EYES, EARS, NOSE,  AND THROAT: No icterus, thyromegaly, ulcers, hemorrhage, or xanthelasma.  CARDIOVASCULAR: Regular rate and rhythm. Normal S1 and S2 without murmur, gallop, or rub. PMI is normal size and placement. Carotid upstroke normal without bruit. Jugular venous pressure is normal.  LUNGS: Clear to auscultation with normal respirations.  ABDOMEN: Soft, nontender, without hepatosplenomegaly or masses. Abdominal aorta is normal size without bruit.  EXTREMITIES: Show 2+ bilateral pulses in dorsal, pedal, radial and femoral arteries without lower extremity edema, cyanosis, clubbing, or ulcers.  NEUROLOGIC: She is oriented at this time.   ASSESSMENT: An 81 year old female with normal pressure hydrocephalus, hyperlipidemia, bradycardia status post pacemaker placement with recurrent syncope of unknown etiology.   RECOMMENDATIONS: 1.  Telemetry. Watching for telemetry unit changes and/or bradycardia.  2.  Possible pacemaker interrogation.  3.  Neurologic consultation for possible normal pressure hydrocephalus symptoms.  4.  Orthostatic assessment for orthostatic hypotension.  5.  Ambulate and follow for other improvement.   ____________________________ Lamar BlinksBruce J. Yadier Bramhall, MD bjk:at D: 05/21/2014 17:48:12 ET T: 05/21/2014 19:04:03 ET JOB#: [id number]  cc: Lamar BlinksBruce J. Rowyn Spilde, MD, <Dictator> Lamar BlinksBRUCE J Rodolfo Gaster MD ELECTRONICALLY SIGNED 05/22/2014 10:19

## 2015-02-01 NOTE — H&P (Signed)
PATIENT NAME:  Kerri [name], Kerri [name] MR#:  [id number] DATE OF BIRTH:  1931/05/12  DATE OF ADMISSION:  01/11/2014  PRIMARY CARE PHYSICIAN: Dr. Leotis ShamesJasmine Singh.   CHIEF COMPLAINT: Nausea, vomiting, and chills.   HISTORY OF PRESENT ILLNESS: This is an 81 year old female who presents to the Emergency Room due to nausea, vomiting, and chills started overnight. The patient has had maybe 5 or 6 episodes of bilious, nonbloody vomiting. She has pretty much vomited up her food and pills she ate this morning. She started to have some chills, but no documented fever. Because her symptoms were not improving, she came to the ER for further evaluation. The patient did not have any documented fever. She denies any diarrhea. She does complain of some lower abdominal pain. On blood work, the patient was noted to have a mild leukocytosis. Also noted to have a urinary tract infection. Hospitalist services were contacted for further treatment and evaluation.   REVIEW OF SYSTEMS:    CONSTITUTIONAL: No documented fever. No weight gain. No weight loss. Positive weakness.  EYES: No blurred or double vision.  EARS, NOSE, THROAT: No tinnitus. No postnasal drip. No redness of the oropharynx.  RESPIRATORY: No cough, no wheeze, no hemoptysis, no dyspnea.  CARDIOVASCULAR: No chest pain, no orthopnea, no palpitations, no syncope.  GASTROINTESTINAL: Positive nausea. Positive vomiting. No diarrhea. No abdominal pain. No melena or hematochezia.  GENITOURINARY: No dysuria or hematuria.  ENDOCRINE: No polyuria, nocturia, heat or cold intolerance.  HEMATOLOGIC: No anemia, no bruising, no bleeding.  INTEGUMENTARY: No rashes. No lesions.  MUSCULOSKELETAL: No arthritis. No swelling. No gout.  NEUROLOGIC: No numbness or tingling. No ataxia. No seizure-type activity.  PSYCHIATRIC: No anxiety. No insomnia. No ADD.   PAST MEDICAL HISTORY: Consistent with hyperlipidemia, depression, a recent diagnosis of normal-pressure hydrocephalus, history  of pelvic fracture, osteoporosis, hypertension.   ALLERGIES: No known drug allergies.   SOCIAL HISTORY: No smoking. No alcohol abuse. No illicit drug abuse. Lives at home with her husband.   FAMILY HISTORY: Both mother and father are deceased. Mother died from bone cancer. Father died as he was killed in an accident.   CURRENT MEDICATIONS: As follows: Aleve 240 mg 2 tabs daily, calcium with vitamin D 1 tab b.i.d., Celexa 40 mg daily, Aricept 5 mg at bedtime, folic acid 1 mg daily, methocarbamol 500 mg t.i.d., Pravachol 20 mg daily, Tylenol with codeine 1 tab q.6 hours as needed, Voltaren gel to be applied to the affected area.   PHYSICAL EXAMINATION: Presently is as follows:  VITAL SIGNS: Temperature 98.3, pulse 82, respirations 18, blood pressure 107/62, sats 94% on room air.  GENERAL: She is a pleasant-appearing female in no apparent distress.  HEAD, EYES, EARS, NOSE, AND THROAT: Atraumatic, normocephalic. Extraocular muscles are intact. Pupils are equal and reactive to light. Sclerae anicteric. No conjunctival injection. No pharyngeal erythema.  NECK: Supple. There is jugular venous distention. No bruits, no lymphadenopathy, no thyromegaly.  HEART: Regular rate and rhythm. No murmurs, no rubs, no clicks.  LUNGS: Clear to auscultation bilaterally. No rales. No rhonchi. No wheezes.  ABDOMEN: Soft, flat, nontender, nondistended. Has good bowel sounds. No hepatosplenomegaly appreciated.  EXTREMITIES: No evidence of any cyanosis, clubbing, or peripheral edema. Has +2 pedal and radial pulses bilaterally.  NEUROLOGIC: The patient is alert, awake, oriented x 3 with no focal motor or sensory deficits appreciated bilaterally.  SKIN: Moist and warm with no rashes appreciated.  LYMPHATIC: There is no cervical or axillary lymphadenopathy.   LABORATORY AND RADIOLOGICAL DATA:  Showed a serum glucose of 116, BUN 13, creatinine 1.42, sodium 140, potassium 3.8, chloride 106, bicarbonate 24. The patient's LFTs  are within normal limits. Troponin less than 0.02. White cell count 13.4, hemoglobin 11.3, hematocrit 35.6, platelet count 389. Urinalysis shows positive nitrites, 3+ leukocyte esterase with 168 white cells.   The patient did have an abdomen 3-way done, which showed no acute cardiopulmonary disease and negative abdominal radiographs.   ASSESSMENT AND PLAN: This is an 82 year old female with a history of a normal-pressure hydrocephalus, hyperlipidemia, depression, history of pelvic fracture, osteoporosis, who presents to the hospital due to nausea, vomiting, and chills and noted to have abnormal urinalysis consistent with genitourinary/gastrointestinal. 1.  Urinary tract infection: This is likely the cause of the patient's nausea, vomiting, and chills. I will treat her with IV ceftriaxone, follow urine and blood cultures.  2.  Nausea, vomiting: Likely related to the underlying urinary tract infection. Treat her with IV antibiotics for the urinary tract infection and continue supportive care with IV fluids and antiemetics.  3.  Leukocytosis: Likely secondary to the urinary tract infection. Will treat with IV antibiotics and follow her white cell count.  4.  History of normal-pressure hydrocephalus: This was recently diagnosed. The patient had a lumbar tap just done only a few days ago. She follows with Dr. Cristopher Peru and has been referred to a neurosurgeon for possible drain placement. No acute issue related to this at this time.  5.  Depression: Continue Celexa.  6.  Hyperlipidemia: Continue Pravachol.  7.  Osteoporosis: Continue with calcium and vitamin D supplements.   CODE STATUS: The patient is a full code.   TIME SPENT: 45 minutes.    ____________________________ Rolly Pancake. Cherlynn Kaiser, MD vjs:jcm D: 01/11/2014 15:16:05 ET T: 01/11/2014 15:56:18 ET JOB#: [id number]  cc: Rolly Pancake. Cherlynn Kaiser, MD, <Dictator> Houston Siren MD ELECTRONICALLY SIGNED 01/20/2014 18:48

## 2015-02-01 NOTE — H&P (Signed)
PATIENT NAME:  Kerri [name], Kerri [name] MR#:  [id number] DATE OF BIRTH:  11-Aug-1931  DATE OF ADMISSION:  05/16/2014  PRIMARY CARE PHYSICIAN: Dr. Leotis ShamesJasmine Singh   EMERGENCY ROOM PHYSICIAN: Dr. Cyril LoosenKinner  CHIEF COMPLAINT: Dizziness.   HISTORY OF PRESENT ILLNESS: This is an 81 year old female patient who started to feel dizzy since yesterday associated with multiple episodes of syncope. The patient was getting physical therapy for recent history of shoulder pain and hip pain. Then at physical therapy she noticed she was feeling dizzy. The patient has multiple episodes of dizziness and syncope since yesterday. Denies any chest pain. The patient did not have any loss of consciousness, did not have any confusion or incontinence after that. The patient does not have any trouble breathing. No abdominal pain. No nausea. No vomiting. The patient's heart rate found to be as low as 27. The patient has 2nd degree type 2 AV block pattern. EKG rhythm strips are obtained in the ER. The patient is alert and oriented and blood pressure is within normal limits, but the problem is only with heart rate dropping as low as 27 and the patient is feeling dizzy with those episodes. The will be admitted to the ICU for symptomatic bradycardia with Mobitz type 2 AV block spoke. I spoke with Dr. Darrold JunkerParaschos regarding possible pacemaker.   PAST MEDICAL HISTORY: Significant for: 1.  Normal pressure hydrocephalus. 2.  Previous UTIs. 3.  Depression. 4.  Hydronephrosis and ureteral stent placement before. 5.  Recent left humeral fracture, getting physical therapy for that. 6.  Unsteady gait due to normal-pressure hydrocephalus.   ALLERGIES: No known drug allergies.  SOCIAL HISTORY: No smoking. No drinking. Lives with husband.  FAMILY HISTORY: Mother and father are deceased. Mother died of bone cancer. Father was killed in an accident.  PAST SURGICAL HISTORY: Significant for pelvic fracture repair.   MEDICATIONS: 1.  Pravastatin 20 mg  p.o. daily. 2.  Celexa 40 mg p.o. daily. 3.  Folic acid 1 mg p.o. daily. 4.  Aricept 5 mg p.o. at bedtime. 5.  Methocarbamol 500 p.o. t.i.d.  6.  Aleve 220 mg 2 tablets once a day.  7.  Voltaren 1% topical gel to effected area.  8.  Oxybutynin 5 mg 1 tablet daily.   REVIEW OF SYSTEMS:  CONSTITUTIONAL: Has no fever. Feels fatigue and dizzy.  EYES: No blurred vision.  ENT: No tinnitus. No epistaxis. No difficulty swallowing.  RESPIRATORY: No cough. No wheezing.  CARDIOVASCULAR: No chest pain. No orthopnea. No pedal edema. Does have syncopal episodes since yesterday.  GASTROINTESTINAL: No nausea. No vomiting. No abdominal pain.  GENITOURINARY: No dysuria.  ENDOCRINE: No polyuria. No polydipsia. INTEGUMENTARY: No skin rashes.  MUSCULOSKELETAL: Getting rehab for hip fracture and also unstable gait.  NEUROLOGIC: The patient was diagnosed with normal-pressure hydrocephalus recently. According to the husband, [facility] are going [facility]. The patient right now not on any medicines or any treatment for that. PSYCHIATRIC: The patient has history of depression and dementia.   PHYSICAL EXAMINATION: VITAL SIGNS: Temperature 98 f> blood pressure 137/70, sats 98% on room air. The patient's heart rate dropped as low as 27. The patient was feeling dizzy at that time.  GENERAL: Well-developed, well-nourished female not in distress.  HEENT: Head normocephalic, atraumatic.  EYES: Pupils equal and reacting to light. No conjunctival pallor. No scleral icterus.  NOSE: No nasal lesions. No drainage.  EARS: No drainage or external lesions.  MOUTH: No lesions. No exudates.  NECK:  Supple. No JVD. No carotid bruits.  LUNGS: Clear to auscultation. No wheeze. No rales. The patient not using accessory muscles. HEART: S1 and S2 regular. Episodes of bradycardia and heart rate at the time of my exam was in the 50s.  ABDOMEN: Soft, nontender, nondistended. Bowel sounds present.   EXTREMITIES: No extremity edema. No cyanosis. No clubbing.  LYMPHATICS: No lymphadenopathy in cervical or axillary region.  NEUROLOGIC: Cranial nerves II through XII intact. DTRs 2+ bilaterally. Sensation is intact. Power 5/5 in upper and lower extremities.  PSYCHIATRIC: Mood and affect are within normal limits.   DIAGNOSTIC DATA: EKG: Initial EKG showed sinus rhythm with sinus arrhythmia, 62 beats per minute, but the patient has been having episodes of bradycardia with heart rate rated around 30s. At that time, the patient was having missed QRS complexes and showing heart block with Mobitz type 2 pattern. The patient also was feeling dizzy at that time.   Other labs include UA with yellow-colored urine with 1+ bacteria, nitrites positive and trace leukocyte esterase.  Head CT shows normal with no acute fracture. Head CT did not show any hydrocephalus at this time.   The patient's cervical spine x-ray showed no fractures.   Sodium is 140, potassium 3.9, chloride 107, bicarb 29, BUN 16, creatinine 1.07, glucose 92. LFTs within normal limits. WBC 8.1, hemoglobin 13.7, hematocrit 42.3, platelets 215,000. Troponin less than 0.02.   Chest x-ray shows bilateral chronic bronchitis changes with superimposed interstitial edema, infection not excluded.   ASSESSMENT AND PLAN: 1.  The patient is an 81 year old female with symptomatic bradycardia with episodes of dizziness and syncopal spells. Admit her to ICU secondary to symptomatic bradycardia with 2nd degree Mobitz type 2 AV block. The patient needs to be closely monitored. Will have cardiology to see the patient. I spoke with Dr. Darrold Junker who will see the patient for assessing the patient for permanent pacemaker evaluation. Until then will have atropine at bedside. Check echo for underlying heart disease.  2.  History of dementia and depression. Continue home medications.  3.  History of incontinence of urine. Uses oxybutynin. Will continue that.    Will sign out to Dr. Leotis Shames.  TIME SPENT: About 60 minutes.   ____________________________ Katha Hamming, MD sk:sb D: 05/16/2014 13:20:46 ET T: 05/16/2014 13:46:48 ET JOB#: [id number]  cc: Katha Hamming, MD, <Dictator> Katha Hamming MD ELECTRONICALLY SIGNED 06/19/2014 10:45

## 2015-02-01 NOTE — Consult Note (Signed)
PATIENT NAME:  Kerri [name], Kerri W MR#:  [name] DATE OF BIRTH:  [birth year]  DATE OF CONSULTATION:  05/16/2014  REFERRING PHYSICIAN:   CONSULTING PHYSICIAN:  Marcina MillardAlexander Jaidyn Usery, MD  PRIMARY CARE PHYSICIAN: Thedore MinsSingh.    CHIEF COMPLAINT: Dizziness.   HISTORY OF PRESENT ILLNESS: The patient is an 81 year old female referred for evaluation of presyncope. The patient reports that she has had a 1-2 week history of intermittent episodes of dizziness. Yesterday, the patient was sitting on a stool and apparently had an episode of presyncope. She was at physical therapy today, and again felt somewhat dizzy and just had a very brief episode of syncope. [facility]. In the Emergency Room, the patient was predominantly in sinus rhythm with episodes of bradycardia with type 2 second degree AV block. The patient remained hemodynamically stable during heart block. The patient is now admitted to the CCU. She denies chest pain, shortness of breath.   PAST MEDICAL HISTORY: 1.  Normal pressure hydrocephalus.  2.  Depression.  3.  History of hydronephrosis, status post urethral stent.   MEDICATIONS: Pravastatin 20 mg daily, Celexa 40 mg daily, folic acid 1 daily, Aricept 5 mg at bedtime, methocarbamol 500 mg t.i.d., Aleve 220 mg b.i.d., Voltaren 1% topical gel, oxybutynin 5 mg daily.   SOCIAL HISTORY: The patient is married. She lives with her husband.   FAMILY HISTORY: No immediate family history of coronary artery disease or myocardial infarction.   REVIEW OF SYSTEMS: CONSTITUTIONAL: No fever or chills.  EYES: No blurry vision.  EARS: No hearing loss.  RESPIRATORY: No shortness of breath.  CARDIOVASCULAR: No chest pain. The patient has had some intermittent dizziness as described above with presyncope.  GASTROINTESTINAL: No nausea, vomiting, or diarrhea.  GENITOURINARY: No dysuria or hematuria.  ENDOCRINE: No polyuria or polydipsia.  MUSCULOSKELETAL: No arthralgias or myalgias.   NEUROLOGICAL: The patient has a history of normal pressure hydrocephalus, not requiring a shunt.  PSYCHIATRIC: No depression or anxiety.   PHYSICAL EXAMINATION: VITAL SIGNS: Blood pressure 136/98, pulse 33 intermittently with heart rates in the 60s and 70s, respirations 20, temperature 97.9, pulse oximetry 98%.  HEENT: Pupils equal, reactive to light and accommodation.  NECK: Supple without thyromegaly.  LUNGS: Clear.  CARDIOVASCULAR: Normal JVP. Normal PMI. Regular rate and rhythm. Normal S1, S2. No appreciable gallop, murmur, or rub.  ABDOMEN: Soft and nontender. Pulses were intact bilaterally.  MUSCULOSKELETAL: Normal muscle tone.  NEUROLOGIC: The patient is alert and oriented x 3. Motor and sensory both grossly intact.   IMPRESSION: An 81 year old female who presents with presyncope, intermittent episodes of type 2 second degree AV block, currently appears hemodynamically stable.   RECOMMENDATIONS: 1.  Admit to CCU.  2.  Apply pacer pads with Zoll pacemaker at bedside.  3.  Would defer temporary transvenous pacemaker at this time.  4.  Proceed with dual-chamber pacemaker implantation scheduled for 05/17/2014. The risks, benefits, and alternatives were explained and informed written consent obtained.    ____________________________ Marcina MillardAlexander Maleik Vanderzee, MD ap:at D: 05/16/2014 16:30:01 ET T: 05/16/2014 17:04:41 ET JOB#: [id number]  cc: Marcina MillardAlexander Mahlia Fernando, MD, <Dictator> Marcina MillardALEXANDER Camden Mazzaferro MD ELECTRONICALLY SIGNED 05/21/2014 13:57

## 2015-02-01 NOTE — Op Note (Signed)
PATIENT NAME:  Kerri [name], Nastacia W MR#:  [id number] DATE OF BIRTH:  [birth year]  DATE OF PROCEDURE:  01/12/2014  PREPROCEDURE DIAGNOSES:  1.  Right hydronephrosis.  2.  Possible mass at right ureteropelvic junction.  POSTOPERATIVE DIAGNOSES:  1.  Right hydronephrosis. 2.  Bladder stone.  PROCEDURES PERFORMED:  1.  Cystoscopy with right 6 French x 24 cm ureteral stent placement. 2.  Right retrograde pyelogram.  3.  Intraoperative fluoroscopy with total time less than one hour. 4.  Interpretation of urography.    COMPLICATIONS:  None.  ANESTHESIA:  General endotracheal anesthesia.  SPECIMENS:  Bladder stones sent to the lab for analysis.  ESTIMATED BLOOD LOSS:  None.  FINDINGS:  There was no mass noted at the right ureterovesical junction, at least intravesically.  There was a small stone in the bladder and I suspect she passed this small stone and that was the source of her hydronephrosis.  There was no filling defects noted on retrograde pyelogram other than a questionable filling defect at the distal ureter.  The ureteral stent was placed without difficulty.  INDICATIONS FOR SURGERY:  Kerri [name] is a pleasant 81 year old female with a history of leukocytosis, right hydronephrosis and right flank pain.  She also had a dirty urinalysis.  After discussing risks and benefits of the operation she elected to proceed to the OR for cystoscopy and right ureteral stent placement.    PROCEDURE:  After the patient was correctly identified in preoperative holding, she was brought to the operating room and placed supine on the operating room table.  A preprocedure timeout was called, she was receiving antibiotics on the floor so no extra were given, SCDs were applied and general endotracheal anesthesia was induced.  The patient was placed in the dorsal lithotomy position taking care to pad all pressure points.  She was then sterilely prepped and draped in the usual fashion followed by pre-incision  timeout.  With ample lubrication with saline running, a 21 French rigid cystoscope was advanced per urethra and into the bladder.  I meticulously inspected the bladder and noted no masses anywhere.  Interestingly, there was a small stone present in the bladder and this was removed and sent to the lab for analysis.  I wonder if this wasn't what caused her primary pathology which was now passed ureteral stone.  I shot a retrograde pyelogram on the right side after cannulating the right ureteral orifice with a wire and then an open-ended catheter.  There was a questionable filling defect in the distal right ureter, but I think this was just a bubble from the retrograde.  There was mild hydronephrosis noted on the right side.  I then placed a 6 French x 24 cm ureteral stent over the wire and up into the kidney.  A good curl was noted in the kidney on fluoroscopy under direct visualization of the bladder.  The bladder was then drained and the patient was woke up from anesthesia without complication, transferred to the recovery room for postoperative care.    ____________________________ Lisabeth PickJohn P. Jillianna Stanek, MD jps:ea D: 01/12/2014 19:50:31 ET T: 01/13/2014 00:12:39 ET JOB#: [id number]  cc: Lisabeth PickJohn P. Rucker Pridgeon, MD, <Dictator> Aloha GellJOHN P [facility] LPELPH MD ELECTRONICALLY SIGNED 01/13/2014 8:41

## 2015-02-01 NOTE — Consult Note (Signed)
Chief Complaint:  Subjective/Chief Complaint feeling well this am. tolerating clears. pain controlled.   VITAL SIGNS/ANCILLARY NOTES: **Vital Signs.:   05-Apr-15 04:05  Vital Signs Type Routine  Temperature Temperature (F) 99  Celsius 37.2  Temperature Source oral  Pulse Pulse 72  Respirations Respirations 20  Systolic BP Systolic BP 212  Diastolic BP (mmHg) Diastolic BP (mmHg) 68  Mean BP 83  Pulse Ox % Pulse Ox % 95  Pulse Ox Activity Level  At rest  Oxygen Delivery 2L  *Intake and Output.:   Daily 05-Apr-15 07:00  Grand Totals Intake:  1700 Output:      Net:  1700 24 Hr.:  1700  IV (Primary)      In:  1600  IV (Secondary)      In:  100  Length of Stay Totals Intake:  1700 Output:      Net:  1700   Brief Assessment:  GEN no acute distress, eating breakfast sitting up in chair   Cardiac Regular   Respiratory normal resp effort  no use of accessory muscles   Gastrointestinal details normal Soft  Nontender   Lab Results: Routine Chem:  05-Apr-15 04:37   Glucose, Serum 81  BUN 14  Creatinine (comp)  1.55  Sodium, Serum 141  Potassium, Serum  3.0  Chloride, Serum  108  CO2, Serum 26  Calcium (Total), Serum  7.6  Anion Gap 7  Osmolality (calc) 281  eGFR (African American)  36  eGFR (Non-African American)  31 (eGFR values <47m/min/1.73 m2 may be an indication of chronic kidney disease (CKD). Calculated eGFR is useful in patients with stable renal function. The eGFR calculation will not be reliable in acutely ill patients when serum creatinine is changing rapidly. It is not useful in  patients on dialysis. The eGFR calculation may not be applicable to patients at the low and high extremes of body sizes, pregnant women, and vegetarians.)  Routine Hem:  05-Apr-15 04:37   WBC (CBC)  15.6  RBC (CBC)  3.48  Hemoglobin (CBC)  9.2  Hematocrit (CBC)  28.3  Platelet Count (CBC) 317  MCV 81  MCH 26.4  MCHC 32.5  RDW  14.7  Neutrophil % 82.1  Lymphocyte %  10.2  Monocyte % 5.3  Eosinophil % 2.1  Basophil % 0.3  Neutrophil #  12.8  Lymphocyte # 1.6  Monocyte # 0.8  Eosinophil # 0.3  Basophil # 0.1 (Result(s) reported on 13 Jan 2014 at 05:26AM.)   Assessment/Plan:  Assessment/Plan:  Assessment 81y/o F with sepsis from UTI with right hydro s/p stent, likely from passed ureteral stone   Plan no new recs at this time. WBC improving, Cr slightly up today but not significantly. f/u culture and treat per sensitivities. patient has f/u tomorrow with Dr. SBernardo Heater If she is unable to make this appt, I would recommend rescheduling as she will need stent removal in the future.   Electronic Signatures: SCharna Archer(MD)  (Signed 05-Apr-15 09:14)  Authored: Chief Complaint, VITAL SIGNS/ANCILLARY NOTES, Brief Assessment, Lab Results, Assessment/Plan   Last Updated: 05-Apr-15 09:14 by SCharna Archer(MD)

## 2015-02-01 NOTE — Consult Note (Signed)
PATIENT NAME:  Kerri [name], Kerri [name] MR#:  [id number] DATE OF BIRTH:  10-16-1930  DATE OF CONSULTATION:  05/23/2014  REFERRING PHYSICIAN:   CONSULTING PHYSICIAN:  Pauletta BrownsYuriy Danyelle Brookover, MD  REASON FOR CONSULTATION: Normal pressure hydrocephalus, follow up on syncope.  HISTORY OF PRESENT ILLNESS: An 81 year old female status post previous admission for similar episodes. 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 The patient was found down. There was no tongue biting, no urinary incontinence and no seizure-like activity. Throughout the hospital course, the patient is status post EEG. EEG showed normal awake electroencephalogram, no epileptiform activity.   PAST MEDICAL HISTORY: Normal pressure hydrocephalus, previous history of urinary tract infection, depression, dementia, unsteady gait, urinary incontinence, hyperlipidemia.   PAST MEDICAL HISTORY: None.   ALLERGIES: No known drug allergies.   REVIEW OF SYSTEMS:  CONSTITUTIONAL: No fever. No chills.  EYES: No blurry vision.  CARDIOVASCULAR: Status post pacemaker.  RESPIRATORY: No shortness of breath. No burning on urination.  NEUROLOGICAL: Positive history of syncope.  HEMATOLOGIC: Position for easy bruising.   RADIOGRAPHIC IMAGING: CAT scan the head showed ventricular dilation with questionable history of normal pressure hydrocephalus. No acute intracranial abnormality was found.   PHYSICAL EXAMINATION:  VITAL SIGNS: Include a temperature of 98.3, pulse 68, respirations 18, blood pressure 159/54, pulse oximetry 93%.  NEUROLOGICAL: The patient was awake and alert to her name, had difficulty telling me the time or the date. Extraocular movements are intact. Facial sensation intact. Facial motor is intact. Generalized weakness throughout upper and lower extremities. Coordination: Finger-to-nose intact. Reflexes diminished. Sensation intact to light touch and temperature.  IMPRESSION: An  81 year old female with multiple syncopal episodes, status post PPM presents status post fall. The patient is close to baseline. EEG was performed, normal awake EEG. CAT scan showed consistent ventricular dilation, suspected normal pressure hydrocephalus.   PLAN: At this point, I do not think any further workup from a neurological standpoint. Appreciate cardiology workup. The patient's weakness has improved, but in general NPH does not usually cause weakness, it does cause mental confusion, gait abnormalities and urinary incontinence with a magnetic type of gait that is prone to falls. The patient is being discharged to skilled nursing facility. I agree with [facility] followup. No further input from a neurological standpoint at this point while in the hospital.   Thank you, it was a pleasure seeing this patient.    ____________________________ Pauletta BrownsYuriy Maryclare Nydam, MD yz:TT D: 05/23/2014 13:03:42 ET T: 05/23/2014 14:00:50 ET JOB#: [id number]  cc: Pauletta BrownsYuriy Amandalynn Pitz, MD, <Dictator> Pauletta BrownsYURIY Tierre Gerard MD ELECTRONICALLY SIGNED 06/14/2014 16:06

## 2015-02-01 NOTE — Discharge Summary (Signed)
Dates of Admission and Diagnosis:  Date of Admission 13-Jan-2014   Date of Discharge 17-Jan-2014   Admitting Diagnosis UTI   Final Diagnosis Right hydronephrosis, urinary calculus, UTI   Discharge Diagnosis 1 Right hydronephrosis   2 Urinary calculus   3 UTI   4 Hyperlipidemia   5 Normal pressure hydrocephalus    Chief Complaint/History of Present Illness Patient presented to the ED with c/o right flank pain, nausea and vomiting. Initial work up revealed leukocytosis, UTI, right hydronephrosis and mild right hydroureter.   Allergies:  No Known Allergies:     Routine Micro:  03-Apr-15 14:05   Micro Text Report URINE CULTURE   ORGANISM 1                >100,000 CFU/ML ESCHERICHIA COLI   ORGANISM 2                >100,000 CFU/ML CITROBACTER KOSERI   ANTIBIOTIC                    ORG#1    ORG#2     AMPICILLIN                    S                  CEFAZOLIN                     S        S         CEFOXITIN                     S        S         CEFTRIAXONE                   S        S         CIPROFLOXACIN                 S        S         GENTAMICIN                    S        S         IMIPENEM                      S        S         LEVOFLOXACIN                  S        S         NITROFURANTOIN                S        S         TRIMETHOPRIM/SULFAMETHOXAZOLE S        S  Culture Comment ID TO FOLLOW SENSITIVITIES TO FOLLOW  Result(s) reported on 12 Jan 2014 at 12:45PM.  Organism Name CITROBACTER KOSERI  Organism Name ESCHERICHIA COLI    16:14   Micro Text Report BLOOD CULTURE   COMMENT                   NO GROWTH IN 48 HOURS   ANTIBIOTIC  Micro Text Report BLOOD CULTURE   COMMENT                   NO GROWTH IN 48 HOURS   ANTIBIOTIC                       Culture Comment NO GROWTH IN 48 HOURS  Result(s) reported on 13 Jan 2014 at 04:00PM.  Culture Comment NO GROWTH IN 48 HOURS  Result(s) reported on 13 Jan 2014 at 04:00PM.  05-Apr-15  07:41   Micro Text Report CLOSTRIDIUM DIFFICILE   C.DIFFICILE ANTIGEN       C.DIFFICILE GDH ANTIGEN : NEGATIVE   C.DIFFICILE TOXIN A/B     C.DIFFICILE TOXINS A AND B : NEGATIVE   INTERPRETATION            Negative for C. difficile.    ANTIBIOTIC                        Routine Chem:  03-Apr-15 08:59   Creatinine (comp)  1.42  05-Apr-15 04:37   Creatinine (comp)  1.55  06-Apr-15 05:04   Creatinine (comp) 1.07  07-Apr-15 06:09   Creatinine (comp) 0.96  Routine Hem:  03-Apr-15 08:59   WBC (CBC) -    10:29   WBC (CBC)  13.4  04-Apr-15 06:02   WBC (CBC)  19.0  05-Apr-15 04:37   WBC (CBC)  15.6  06-Apr-15 05:04   WBC (CBC)  13.1   PERTINENT RADIOLOGY STUDIES: XRay:    15-Feb-15 09:24, Humerus Left  Humerus Left   REASON FOR EXAM:    fall injury  COMMENTS:       PROCEDURE: DXR - DXR HUMERUS LEFT  - Nov 25 2013  9:24AM     CLINICAL DATA:  Fall with left shoulder and upper arm pain.    EXAM:  LEFT HUMERUS - 2+ VIEW    COMPARISON:  None.    FINDINGS:  Acute fracture of the surgical neck of the proximal humerus  identified with impaction and displacement. No other injuries are  identified. Soft tissues show fullness and swelling surrounding the  fracture site.     IMPRESSION:  Impacted and displaced fracture involving the surgical neck of the  proximal left humerus.      Electronically Signed    By: Irish Lack M.D.    On: 11/25/2013 09:32         Verified By: Reola Calkins, M.D.,    03-Apr-15 10:02, Abdomen 3 Way Includes PA Chest  Abdomen 3 Way Includes PA Chest   REASON FOR EXAM:    abd pain, constipation  COMMENTS:       PROCEDURE: DXR - DXR ABDOMEN 3-WAY (INCL PA CXR)  - Jan 11 2014 10:02AM     CLINICAL DATA:  Constipation, pain    EXAM:  ABDOMEN SERIES    COMPARISON:  11/20/2013 and earlier studies    FINDINGS:  Heart size and mediastinal contours are within normal limits. Lungs  are clear. No effusion.  No free air. Normal bowel gas  pattern.    There are no abnormal calcifications. Stable mild thoracic  dextroscoliosis. Subacute fracture, surgical neck left humerus, with  stable displacement. .     IMPRESSION:  No acute cardiopulmonary disease.  Negative abdominal radiographs.    Subacute  fracture, surgical neck left humerus      Electronically Signed    By: Oley Balm  M.D.    On: 04/03/201510:05         Verified By: Philis Fendt, M.D.,  MRI:    26-Mar-15 13:01, MRI Brain Without Contrast  MRI Brain Without Contrast   REASON FOR EXAM:    gait impairment memory loss  eval stroke  COMMENTS:       PROCEDURE: MR  - MR BRAIN WO CONTRAST  - Jan 03 2014  1:01PM     CLINICAL DATA:  81 year old female with fall 6 weeks ago. Balance  issues. Gait impairment. Initial encounter.    EXAM:  MRI HEAD WITHOUT CONTRAST    TECHNIQUE:  Multiplanar, multiecho pulse sequences of the brain and surrounding  structures were obtained without intravenous contrast.  COMPARISON:  Head CT without contrast 12/03/2013. Brain MRI  08/17/2007.    FINDINGS:  Since 2008, mild further generalized cerebral volume loss has  occurred. Cerebellar volume loss appears chronically  disproportionate to that of the hemispheres. Chronic prominence of  the ventricles. No cortical encephalomalacia identified.    Major intracranial vascular flow voids are stable.    There is moderate Patchy and confluent T2 hyperintensity in the pons  which appears increased (series 9, image 8). Similar left greater  than right chronic globus pallidus T2 hyperintensity also appears  increased. Patchy mostly periventricular white matter T2 and FLAIR  hyperintensity also mildly increased. No chronic blood products  identified in the brain.    No restricted diffusion or evidence of acute infarction. Visualized  scalp soft tissues are within normal limits. No midline shift, mass  effect, or evidence of intracranial mass lesion. No  acute  intracranial hemorrhage identified. Negative pituitary,  cervicomedullary junction and visualized cervical spine. Normal bone  marrow signal. Stable orbits soft tissues. Trace left mastoid fluid.  Mild mucosal thickening in the paranasal sinuses. Negative  nasopharynx.     IMPRESSION:  1. Progressed volume loss in the brain since 2008, with chronic  disproportionate but nonspecific cerebellar volume loss.  2. Chronic ventricular prominence, favor ex vacuo in nature, but in  the appropriate clinical setting normal pressure hydrocephalus could  not be excluded.  3. Progressed and moderate for age nonspecific signal changes in the  brain, most commonly due to chronic small vessel disease.      Electronically Signed    By: Augusto Gamble M.D.    On: 01/03/2014 15:05         Verified By: Kevan Ny. HALL, M.D.,  CT:    04-Apr-15 14:30, CT Abdomen and Pelvis With Contrast  CT Abdomen and Pelvis With Contrast   REASON FOR EXAM:    (1) rlq pain; (2) rlq pain  COMMENTS:       PROCEDURE: CT  - CT ABDOMEN / PELVIS  W  - Jan 12 2014  2:30PM     CLINICAL DATA:  History right lower quadrant abdominal pain    EXAM:  CT ABDOMEN AND PELVIS WITH CONTRAST    TECHNIQUE:  Multidetector CT imaging of the abdomen and pelvis was performed  using the standard protocol following bolus administration of  intravenous contrast.  CONTRAST:  100 mL Isovue-300    COMPARISON:  DG ACUTE ABDOMEN SERIES dated 01/11/2014; CT ABDOMEN W/O  CM dated 07/07/2012    FINDINGS:  Poorly defined areas of mild increased density within the posterior  lung bases. There are areas of interstitial thickening. There is  diffuse thickening of the distal esophageal wall.    Stable cyst appreciated within the posterior  dome of the right lobe  of the liver, and stable areas of focal fatty infiltration adjacent  to the falciform ligament on the left. Liver is otherwise  unremarkable. Gallbladder and gallbladder fossa are  unremarkable.  The spleen, adrenals, and pancreas are unremarkable.  There is diffuse mild inflammatory change,mild to moderate  hydronephrosis, and mild hydroureter on the right. The mild ureteral  dilation extends to the urinary bladder. There is increased soft  tissue prominence at the level of the vesicoureteral junction on the  right. This finding is poorly visualized with differential  considerations of inflammatory change versus a mass. Further  evaluation with direct visualization is recommended. There is no  evidence of nephro or ureterolithiasis. No definite renal mass  appreciated on the right.    Mild pelviectasis with an extrarenal component appreciated in the  left kidney otherwise unremarkable.    There is no evidence of bowel obstruction, enteritis, colitis, or  diverticulitis. Diffuse diverticulosis is appreciated within the  distal descending and sigmoid colon. No secondary signs of  appendicitis.    No abdominal or pelvic free fluid, loculated fluid collections,  further evidence of masses, nor adenopathy.    Remote healed bilateral pubic rami fractures. No aggressive  appearing osseous lesions. Degenerative changes within the lower  lumbar spine. Are also degenerative changes within the right  sacroiliac joint.     IMPRESSION:  1. Mild to moderate obstructive uropathy involving the right kidney  with findings concerning for a mass at the level of the  vesicoureteral junction. There is no evidence of renal calculus  disease. Further evaluation with a urologic consultation  recommended.  2. Colonic diverticulosis  3. Diffuse thickening of the distal esophagus clinically warranted  further evaluation with direct visualization.  4. Chronic and degenerative changes within the skeleton  5. Atelectasis versus mild infiltrates within the lung bases.      Electronically Signed    By: Salome HolmesHector  Cooper M.D.    On: 01/12/2014 16:00         Verified By: Jani FilesHECTOR W.  COOPER, M.D., MD   Pertinent Past History:  Pertinent Past History Hyperlipidemia Normal pressure hydrocephalus Urinary incontinence Left humeral fracture   Hospital Course:  Hospital Course Patient was admitted and started on i.v Ceftriaxone for UTI. She underwent cystoscopy and right ureteral stent placement on 01/12/14. Calculus was found in the urinary bladder during the procedure. Leukocytosis improved gradually. Nausea and vomiting resolved. Creatinine improved from 1.55 on 01/13/14 to 0.96 on 01/15/14. Patient's condition improved gradually. She was started on Ditropan XL for urinary urgency and incontinence. Urine c/s revealed E.coli and Citerobacter Koseri sensitive to Ceftriaxone and Bactrim. Blood c/s was negative.Exam: Alert Oriented x3, NAD. HENT: No JVD, Chest: clear to auscultation. CVS: RRR, S1S2, Abdomen: soft, nontender, +BS. Ext: No edema. Discharge to SNF.  Patient will continue Bactrim for ten days after discharge for UTI.   Condition on Discharge Stable   DISCHARGE INSTRUCTIONS HOME MEDS:  Medication Reconciliation: Patient's Home Medications at Discharge:     Medication Instructions  pravastatin 20 mg oral tablet  1 tab(s) orally once a day (at bedtime)   citalopram 40 mg oral tablet  1 tab(s) orally once a day   calcium 600+d 600 mg-200 units oral tablet  1 tab(s) orally 2 times a day   folic acid 1 mg oral tablet  1 tab(s) orally once a day   donepezil 5 mg oral tablet  1 tab(s) orally once a day (at bedtime)  methocarbamol 500 mg oral tablet  1 tab(s) orally 3 times a day   aleve sodium 220 mg oral tablet  2 tab(s) orally once a day   voltaren topical 1% topical gel  Apply topically to affected area , As Needed - for Pain   oxybutynin 5 mg/24 hours oral tablet, extended release  1 tab(s) orally once a day   sulfamethoxazole-trimethoprim  1 tab(s) orally every 12 hours    PRESCRIPTIONS: ELECTRONICALLY SUBMITTED  STOP TAKING THE FOLLOWING MEDICATION(S):     tylenol with codeine #3 300 mg-30 mg oral tablet: 1 tab(s) orally every 6 hours, As Needed  Physician's Instructions:  Diet Low Fat, Low Cholesterol   Activity Limitations As tolerated   Return to Work Not Applicable   Time frame for Follow Up Appointment 1-2 weeks  Urology   Time frame for Follow Up Appointment 1-2 weeks  Dr. Leotis Shames   Time frame for Follow Up Appointment 2-4 weeks  [facility] neurology   Electronic Signatures: Leotis Shames (MD)  (Signed 09-Apr-15 15:37)  Authored: ADMISSION DATE AND DIAGNOSIS, CHIEF COMPLAINT/HPI, Allergies, PERTINENT LABS, PERTINENT RADIOLOGY STUDIES, PERTINENT PAST HISTORY, HOSPITAL COURSE, DISCHARGE INSTRUCTIONS HOME MEDS, PATIENT INSTRUCTIONS   Last Updated: 09-Apr-15 15:37 by Leotis Shames (MD)

## 2015-02-01 NOTE — Consult Note (Signed)
pt seen and examined. history taken, labs and CT reviewed. full dictated note to follow but given positive urinalysis and Ucx, leukocytosis, right hydro, and concern for mass on CT, will take to OR for cysto right RPG and stent placement. patient consented with her family present  Electronic Signatures: Lisabeth PickSelph, Dhanvi Boesen P (MD)  (Signed on 04-Apr-15 18:48)  Authored  Last Updated: 04-Apr-15 18:48 by Lisabeth PickSelph, Courtnay Petrilla P (MD)

## 2015-02-01 NOTE — H&P (Signed)
PATIENT NAME:  Kerri [name], Kerri [name] MR#:  [id number] DATE OF BIRTH:  07-18-1931  DATE OF ADMISSION:  05/21/2014  PRIMARY CARE PHYSICIAN: Dr. Leotis Shames.  CHIEF COMPLAINT: Syncope.   HISTORY OF PRESENT ILLNESS: This is an 81 year old female who was recently in the hospital for a similar complaint and had a pacemaker placed for a Mobitz type 2 AV block with a heart rate in the 20s. She was discharged on Sunday the 9th and had a pass out episode today at 10:15 a.m. It lasted around until 10:20 when she went from the bedroom to the bathroom and passed out there. She was holding a glass of water, which broke, lost consciousness for a period of time and then came through, was slightly confused afterwards. No loss of urine. No tongue biting. The physical therapist came, worked with her and she passed out while she was in the chair working with physical therapy. She shook for a few seconds less than 15 seconds. No loss of urine or no tongue biting at that time. For a few seconds she was confused and came through. She was brought to the ER for further evaluation.   PAST MEDICAL HISTORY: Normal pressure hydrocephalus, previous UTIs, depression, dementia, unsteady gait, urinary incontinence, hyperlipidemia.   PAST SURGICAL HISTORY: None.   ALLERGIES: No known drug allergies.   MEDICATIONS: Include Aleve 220 mg 2 tablets once a day, calcium and vitamin D 600 mg/200 international units 1 tablet twice a day, Celexa 40 mg daily, donepezil 5 mg at bedtime, folic acid 1 mg daily, Levaquin 250 mg every 24 hours for 3 days from previous discharge, methocarbamol 500 mg 1 tablet 3 times a day, oxybutynin 5 mg per 24 hours extended-release 1 tablet daily, pravastatin 20 mg at bedtime, Voltaren topical 1% apply to area affected once a day.   SOCIAL HISTORY: No smoking. No alcohol. No drug use. 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   FAMILY HISTORY: Mother died of bone cancer. Father killed in  an accident.   REVIEW OF SYSTEMS:   CONSTITUTIONAL: Positive for cold feeling. No fever or chills. Positive for weight loss.  HEENT: Eyes: She does wear glasses. Ears, nose, mouth and throat: Positive for runny nose. No sore throat. No difficulty swallowing.  CARDIOVASCULAR: Positive for soreness in the chest with pacemaker.  RESPIRATORY: No shortness of breath. No cough. No sputum. No hemoptysis.  GASTROINTESTINAL: No nausea. No vomiting. No abdominal pain. No diarrhea. No constipation. No bright red blood per rectum. No melena.  GENITOURINARY: No burning on urination. No hematuria.  MUSCULOSKELETAL: Positive for joint pain in the pelvis and arm.  INTEGUMENT: No rashes or eruptions.  NEUROLOGIC: Positive for syncope.  PSYCHIATRIC: On medication for depression and dementia,  ENDOCRINE: No thyroid problems.  HEMATOLOGIC AND LYMPHATIC: Positive for easy bruising.   PHYSICAL EXAMINATION:  VITAL SIGNS: On presentation to the ER included a pulse of 83, respiratory rate 18, blood pressure of 108/92, pulse oximetry 93% on room air.  GENERAL: No respiratory distress.  HEENT: Eyes: Conjunctivae and lids normal. Pupils equal, round, and reactive to light. Extraocular muscles intact. No nystagmus. Ears, nose, and throat: Tympanic membranes: No erythema. Nasal mucosa: No erythema. Throat: No erythema, no exudate seen. Lips and gums: No lesions.  NECK: No JVD. No bruits. No lymphadenopathy. No thyromegaly. No thyroid nodules palpated.  RESPIRATORY: Lungs clear to auscultation. No use of accessory muscles to breathe. No rhonchi, rales, or wheeze heard.  CARDIOVASCULAR:  S1, S2 normal. No gallops, rubs, or murmurs heard. Carotid upstroke 2+ bilaterally. No bruits. Dorsalis pedis pulses 2+ bilaterally. No edema of the lower extremity.  ABDOMEN: Soft, nontender. No organomegaly/splenomegaly. Normoactive bowel sounds. No masses felt.  LYMPHATIC: No lymph nodes in the neck.  MUSCULOSKELETAL: No clubbing, edema,  or cyanosis.  SKIN: No ulcers or lesions.  NEUROLOGIC: Cranial nerves II through 12 grossly intact. Deep tendon reflexes 1+ bilateral lower extremities.  PSYCHIATRIC: The patient is alert, oriented to person and place.   LABORATORY AND RADIOLOGICAL DATA: Chest x-ray: Mild left basilar atelectasis. White blood cell count 10.0, H and H 12.8 and 40.3, platelet count of 176,000. Glucose 82, BUN 23, creatinine 1.17. Sodium 143, potassium 4.0, chloride 110, CO2 of 27, calcium 8.1. Troponin negative.   ASSESSMENT AND PLAN:  1. Repeated syncope despite a pacemaker just placed for a heart block. Will get a cardiology consultation to interrogate the pacer. We will admit to telemetry as an observation. We will get physical therapy evaluation, will check orthostatic vital signs. I will get a neurology consultation and an EEG just in case other etiology for syncope. The patient does have a strange affect so I am wondering if there could be a psychiatric component to this but will rule out medical causes first.  2. Hyperlipidemia. Continue pravastatin.  3. Depression, dementia, on Celexa and Aricept.  4. History of normal pressure hydrocephalus. I will get physical therapy evaluation. Check out gait.  5. I will repeat a CT scan of the head.  TIME SPENT ON ADMISSION: 55 minutes.   CODE STATUS: The patient is a full code.    ____________________________ Herschell Dimesichard J. Renae GlossWieting, MD rjw:lt D: 05/21/2014 15:39:05 ET T: 05/21/2014 16:52:43 ET JOB#: [id number]  cc: Herschell Dimesichard J. Renae GlossWieting, MD, <Dictator> Leotis ShamesJasmine Singh, MD Salley ScarletICHARD J Finnbar Cedillos MD ELECTRONICALLY SIGNED 05/21/2014 20:00

## 2015-02-01 NOTE — Consult Note (Signed)
PATIENT NAME:  Kerri, [name] MR#:  [id number] DATE OF BIRTH:  [birth year]  DATE OF CONSULTATION:  01/12/2014  REFERRING PHYSICIAN:  Dr. Blase Mess PHYSICIAN:  Lisabeth Pick, MD  REASON FOR CONSULTATION:  Right hydronephrosis.  HISTORY OF PRESENT ILLNESS:  Kerri [name] is a pleasant 81 year old female with a history of right hydronephrosis who is seen in consultation at the request of Dr. Thedore Mins for evaluation and recommendations regarding this concern. Kerri [name] was admitted to the hospitalist service with acute onset nausea, vomiting, and chills. On Friday, the patient reported that she was having significant nausea and vomiting, so much so that she came to the Emergency Room. She also stated that she was having chills, but did not check her temperature. Over the last month, she states she has been having some urgency and urinary frequency. She denies any dysuria or hematuria. During her admission, she was noted to have a white blood cell count of 13 and a urinalysis concerning for a UTI. Today, she had acute worsening of her abdominal pain and concerns for a possible surgical abdomen, and she underwent a CT scan which revealed moderate right hydronephrosis and hydroureter with concerns for a possible mass at the ureterovesical junction. Kerri [name] reports no history of smoking. She has never had kidney stones before. She has never had any procedures done on her bladder or kidneys. She is feeling better after some morphine, but does continue to complain of right lower quadrant pain. She does not note any exacerbating factors. It has been relieved with her pain medicine.   PAST MEDICAL HISTORY:  1.  Normal-pressure hydrocephalus.  2.  Hyperlipidemia. 3.  Depression. 4.  Pelvic fracture. 5.  Osteoporosis.  6.  Hypertension. 7.  Shoulder fracture.  PAST SURGICAL HISTORY:  Hysterectomy.  SOCIAL HISTORY:  The patient does not smoke cigarettes and never has. She does not drink alcohol.  She does not use illegal drugs. She lives at home with her husband, but she is here with her son. She was not exposed to any dyes or tobacco smoke.  FAMILY HISTORY:  Her mother had bone cancer. She also had a relative with another unknown cancer, although they think it was breast cancer. No history of stones or GU malignancy in the family.  ALLERGIES:  No known drug allergies.    MEDICATIONS:  Home medications include: 1.  Aleve. 2.  Calcium with vitamin D. 3.  Celexa. 4.  Aricept. 5.  Folic acid. 6.  Methocarbamol. 7.  Pravachol. 8.  Tylenol with codeine. 9.  Voltaren.  Inpatient medications include Zosyn.  REVIEW OF SYSTEMS:  Ten-point review of systems is negative except for pertinent positives noted in HPI.  PHYSICAL EXAMINATION:  VITAL SIGNS:  Temperature 98.3, pulse 83 (down from 112), respirations 20, blood pressure 109/66, oxygen saturation 93% on room air. GENERAL:  Pleasant female, lying down in bed in no acute distress. HEENT:  Normocephalic, atraumatic. Extraocular movements intact. Oropharynx mucosa pink and moist. PSYCHIATRIC:  Appropriate mood and affect and cooperative. NECK:  Supple. Trachea midline. CHEST:  Breathing comfortably on room air with nonlabored respirations. HEART:  Regular rate and rhythm. ABDOMEN:  Mildly distended, soft. No masses. She has very mild right CVA tenderness, no left CVA tenderness. EXTREMITIES:  Warm and well-perfused.  NEUROLOGIC:  Cranial nerves II through XII grossly intact.   MUSCULOSKELETAL:  Normal range of motion in her lower extremities.  LABORATORY DATA:  Reveals creatinine of 1.4. Her white blood cell count is 19,  which has risen from 13 yesterday. Platelet count is 378 and her hemoglobin is 10, down from 11.3. Her urine culture is growing greater than 100,000 gram-negative rods, and she has negative blood cultures to date. Her urinalysis shows positive nitrites, 3+ leukocyte esterase, 29 red cells per high-power field, and 168  white cells per high-power field.   IMAGING:  Her CT scan was personally reviewed by me. It shows moderate right hydronephrosis and hydroureter. The radiology comment was that there is a possible mass at the UVJ. There is some hyperdensity here, but I do not see any obvious mass present at her UVJ. I do not see any pelvic lymphadenopathy.   ASSESSMENT:  An 81 year old female with a urinary tract infection and right-sided hydronephrosis and possible concern for a mass at her ureterovesical junction with a rising white blood cell count and tachycardia.  PLAN:  I discussed my concerns with Kerri [name] and her family, including the possibility that she has a mass at her UVJ that is obstructing her right kidney and leading to her current state. Based on her rising white count and the fact that she has clinically not gotten any better, I would like to take her back to the OR for cystoscope and right retrograde pyelogram and possible stent placement. I told them I would evaluate for any masses in the bladder. It is also possible she has passed a stone, but given no history of stones and none seen on CT that would be lower on the differential. If there is something obvious, I might do a small biopsy, but I would not be aggressive given concerns for making her septic. Her risk of TCC is low given her lack of exposure to tobacco and other bladder carcinogens. I discussed risks and benefits of the operation including infection, bleeding, bladder perforation, ureteral perforation, sepsis, and malposition of the stent. The patient and her family expressed agreement and understand and would like to proceed to the operating room. She has been n.p.o. essentially all day. We will plan to take her to the operating room this evening for said procedure.    ____________________________ Lisabeth PickJohn P. Kaylen Nghiem, MD jps:jcm D: 01/12/2014 18:17:32 ET T: 01/12/2014 20:39:46 ET JOB#: [id number]  cc: Lisabeth PickJohn P. Carolyne Whitsel, MD, <Dictator> Aloha GellJOHN P  [facility] HospitalELPH MD ELECTRONICALLY SIGNED 01/13/2014 8:41

## 2015-02-01 NOTE — Op Note (Signed)
PATIENT NAME:  Kerri [name], Kerri [name] MR#:  [id number] DATE OF BIRTH:  [birth year]  DATE OF PROCEDURE:  05/17/2014  ATTENDING OF RECORD:  Sharion Settler, MD   TITLE OF PROCEDURE NOTE:  Implantation of a dual-chamber pacemaker.   INDICATION:  1.  Syncope.  2.  Intermittent high degree atrioventricular block.   DETAILS OF PROCEDURE:  Written informed consent was obtained and placed in the patient's permanent medical record. The risks and benefits of the procedure were explained in detail at the time consent process. The patient was brought to the operating room in a fasting, nonsedated state. The appropriate resuscitative equipment was attached to the patient. Visual inspection of the patient's left infraclavicular fossa showed superficial dilated veins and thus a venogram was performed at the onset of the procedure which showed a patent axillary subclavian system on the left. The patient was then prepped and draped in the usual sterile manner. Using local anesthetic, the area of the left infraclavicular fossa was anesthetized.   Using a scalpel, a 2 to 3 cm incision was made in the left infraclavicular fossa. Using a combination of blunt dissection and electrocautery the pacemaker pocket was fashioned in the usual manner just above the pectoralis muscle. Access to the central circulation was performed x 2, using the modified Seldinger technique and fluoroscopic guidance. Two guidewires were passed into the area of the inferior vena cava. Over the first guidewire, a 7 French sheath was advanced the dilator and guidewire were subsequently removed. The right ventricular lead was then taken to the right ventricular outflow tract and then withdrawn into the low interventricular septum. Sensed R waves there were 10.0 mV with an impedance of 982 ohms and a threshold of 0.6 v at 0.5 ms; the lead was then secured in place. Adequate redundancy was confirmed and was sewn to the prepectoralis fascia using an 0 silk suture.    Attention was then turned to the second guidewire over which a sheath was advanced and the dilator and guidewire were subsequently removed. The right atrial lead was taken to the area of the right atrial appendage and secured in place. The sensed P waves there were 1.7 mV, the threshold was 1.1 v at 0.5 ms, and impedance of 561 ohms. Appropriate redundancy was confirmed and the lead was secured to the prepectoralis fascia using an 0 silk suture.   Adequate hemostasis was obtained. The pocket was then irrigated with copious amounts of gentamicin solution. The device then was attached to the right atrial and right ventricular leads and then placed in the pocket. The pocket was closed with running layers of 2-0 and 3-0 Vicryl and the subcuticular layer with 4-0 Vicryl.   Steri-Strips were applied over the incision site as well as sterile gauze and OpSite. There were no complications noted at the conclusion of the procedure.   ESTIMATED BLOOD LOSS:  Was 10 mL.   SUMMARY OF IMPLANTED HARDWARE:  The patient received a Medtronic dual-chamber pacemaker, model Advisa DR MRI SureScan serial number [id number]. The right atrial lead was a Medtronic [id number], serial number [id number], and the right ventricular lead was a Medtronic [id number], serial number [id number], all implanted on 05/17/2014.   FINAL PROGRAM PARAMETERS:  Mode DDDR with a lower rate limit of 60, upper tracking rate 130, upper sensory rate 130, paced AV delay 180, and sensed AV delay is 150.    ____________________________ Anna Genre. Maisie Fus, MD klt:nt D: 05/17/2014 13:43:01 ET T: 05/17/2014 19:49:54 ET JOB#: [id number]  cc: Caryn Bee  Bernadene BellL. Tandra Rosado, MD, <Dictator> Sharion SettlerKEVIN L Diala Waxman MD ELECTRONICALLY SIGNED 06/18/2014 14:27

## 2015-02-01 NOTE — Discharge Summary (Signed)
Dates of Admission and Diagnosis:  Date of Admission 16-May-2014   Date of Discharge 19-May-2014   Admitting Diagnosis Dizziness, Bradycardia, Mobitz type 2 AV block   Final Diagnosis Dizziness, Bradycardia, Mobitz type 2 AV block   Discharge Diagnosis 1 Dizziness, Bradycardia, Mobitz type 2 AV block   2 Hyperlipidemia    Chief Complaint/History of Present Illness Elderly lady admitted with symptomatic bradycardia, dizziness and heart rate of 27, noted to have AV block, Mobitz type 2, in the ED.   Allergies:  No Known Allergies:     Hepatic:  08-Aug-15 04:25   Bilirubin, Total 0.6  Alkaline Phosphatase 91 (46-116 NOTE: New Reference Range 04/30/14)  SGPT (ALT) 16 (14-63 NOTE: New Reference Range 04/30/14)  SGOT (AST) 17  Total Protein, Serum 6.6  Albumin, Serum  3.0  Cardiology:  06-Aug-15 17:06   Echo Doppler REASON FOR EXAM:     COMMENTS:     PROCEDURE: [facility] - ECHO DOPPLER COMPLETE(TRANSTHOR)  - May 16 2014  5:06PM   RESULT: Echocardiogram Report  Patient Name:   Kerri [name] Date of Exam: 05/16/2014 Medical Rec #:  [id number]           Custom1: Date of Birth:  08/13/1931        Height:       68.0 in Patient Age:    81 years         Weight:       160.0 lb Patient Gender: F                BSA:          1.86 m??  Indications: Syncope Sonographer:    Arville Go RDCS Referring Phys: Glendon Axe  Sonographer Comments: Suboptimal parasternal window and suboptimal  subcostal window.  Summary:  1. Left ventricular ejection fraction, by visual estimation, is 55 to  60%.  2. Normal global left ventricular systolic function.  3. Mild mitral valve regurgitation.  4. Mild tricuspid regurgitation. 2D AND M-MODE MEASUREMENTS (normal ranges within parentheses): Left Ventricle:          Normal IVSd (2D):      1.15 cm (0.7-1.1) LVPWd (2D):     1.09 cm (0.7-1.1) Aorta/LA:         Normal LVIDd (2D):     3.71 cm (3.4-5.7) Aortic Root (2D): 2.40 cm (2.4-3.7) LVIDs  (2D):     2.59 cm           Left Atrium (2D): 2.60 cm (1.9-4.0) LV FS (2D):     30.2 %   (>25%) LV EF (2D):     58.3 %   (>50%)         Right Ventricle:                                   RVd (2D): LV DIASTOLIC FUNCTION: MV Peak E: 0.32 m/s Decel Time: 280 msec MV Peak A: 0.46 m/s E/A Ratio: 0.68 SPECTRAL DOPPLER ANALYSIS (where applicable): Mitral Valve: MV P1/2 Time:81.20 msec MV Area, PHT: 2.71 cm?? Aortic Valve: AoV Max Vel: 1.33 m/s AoV Peak PG: 7.1 mmHg AoV Mean PG: LVOT Vmax: 1.25 m/s LVOT VTI:  LVOT Diameter: 1.90 cm AoV Area, Vmax: 2.66 cm?? AoV Area, VTI:  AoV Area, Vmn: Tricuspid Valve and PA/RV Systolic Pressure: TR Max Velocity: 2.29 m/s RA  Pressure: 5 mmHg RVSP/PASP: 26.0 mmHg Pulmonic Valve: PV Max Velocity: 0.94 m/s  PV Max PG: 3.5 mmHg PV Mean PG:  PHYSICIAN INTERPRETATION: Left Ventricle: The left ventricular internal cavity size wasnormal. LV  septal wall thickness was normal. LV posterior wall thickness was normal.  Global LV systolic function was normal. Left ventricular ejection  fraction, by visual estimation, is 55 to 60%. Right Ventricle: The right ventricular size isnormal. Global RV systolic  function is normal. Left Atrium: The left atrium is normal in size. Right Atrium: The right atrium is normal in size. Pericardium: There is no evidence of pericardial effusion. Mitral Valve: The mitral valve is normalin structure. Mild mitral valve  regurgitation is seen. Tricuspid Valve: The tricuspid valve is normal. Mild tricuspid  regurgitation is visualized. The tricuspid regurgitant velocity is 2.29  m/s, and with an assumed right atrial pressure of 5 mmHg, the estimated  right ventricular systolic pressure is normal at 26.0 mmHg. Aortic Valve: The aortic valve is normal. Pulmonic Valve: The pulmonic valve is normal.  [place] MD Electronically signed by [place] MD Signature Date/Time: 05/18/2014/9:26:51 AM *** Final  ***  IMPRESSION: .    Verified By: Yolonda Kida, M.D., MD  Routine Chem:  08-Aug-15 04:25   Glucose, Serum  112  BUN 16  Creatinine (comp) 1.16  Sodium, Serum 139  Potassium, Serum  3.4  Chloride, Serum 106  CO2, Serum 26  Calcium (Total), Serum  8.2  Osmolality (calc) 279  eGFR (African American)  51  eGFR (Non-African American)  44 (eGFR values <7m/min/1.73 m2 may be an indication of chronic kidney disease (CKD). Calculated eGFR is useful in patients with stable renal function. The eGFR calculation will not be reliable in acutely ill patients when serum creatinine is changing rapidly. It is not useful in  patients on dialysis. The eGFR calculation may not be applicable to patients at the low and high extremes of body sizes, pregnant women, and vegetarians.)  Anion Gap 7  Cardiac:  06-Aug-15 10:57   Troponin I < 0.02 (0.00-0.05 0.05 ng/mL or less: NEGATIVE  Repeat testing in 3-6 hrs  if clinically indicated. >0.05 ng/mL: POTENTIAL  MYOCARDIAL INJURY. Repeat  testing in 3-6 hrs if  clinically indicated. NOTE: An increase or decrease  of 30% or more on serial  testing suggests a  clinically important change)    21:30   Troponin I < 0.02 (0.00-0.05 0.05 ng/mL or less: NEGATIVE  Repeat testing in 3-6 hrs  if clinically indicated. >0.05 ng/mL: POTENTIAL  MYOCARDIAL INJURY. Repeat  testing in 3-6 hrs if  clinically indicated. NOTE: An increase or decrease  of 30% or more on serial  testing suggests a  clinically important change)    23:50   Troponin I < 0.02 (0.00-0.05 0.05 ng/mL or less: NEGATIVE  Repeat testing in 3-6 hrs  if clinically indicated. >0.05 ng/mL: POTENTIAL  MYOCARDIAL INJURY. Repeat  testing in 3-6 hrs if  clinically indicated. NOTE: An increase or decrease  of 30% or more on serial  testing suggests a  clinically important change)  Routine UA:  06-Aug-15 12:06   Color (UA) Yellow  Clarity (UA) Clear  Glucose (UA) Negative   Bilirubin (UA) Negative  Ketones (UA) Negative  Specific Gravity (UA) 1.010  Blood (UA) 1+  pH (UA) 6.0  Protein (UA) Negative  Nitrite (UA) Positive  Leukocyte Esterase (UA) Trace (Result(s) reported on 16 May 2014 at 12:26PM.)  RBC (UA) 4 /HPF  WBC (UA) 21 /HPF  Bacteria (UA) 1+  Epithelial Cells (UA) <1 /HPF (Result(s)  reported on 16 May 2014 at 12:26PM.)  Routine Hem:  08-Aug-15 04:25   WBC (CBC) 9.8  RBC (CBC) 4.93  Hemoglobin (CBC) 13.4  Hematocrit (CBC) 40.8  Platelet Count (CBC) 185  MCV 83  MCH 27.2  MCHC 32.9  RDW 14.5  Neutrophil % 65.8  Lymphocyte % 23.4  Monocyte % 7.9  Eosinophil % 2.4  Basophil % 0.5  Neutrophil # 6.5  Lymphocyte # 2.3  Monocyte # 0.8  Eosinophil # 0.2  Basophil # 0.1 (Result(s) reported on 18 May 2014 at 05:11AM.)   PERTINENT RADIOLOGY STUDIES: XRay:    06-Aug-15 11:16, Chest Portable Single View  Chest Portable Single View   REASON FOR EXAM:    Chest pain  COMMENTS:       PROCEDURE: DXR - DXR PORTABLE CHEST SINGLE VIEW  - May 16 2014 11:16AM     CLINICAL DATA:  Dizziness, chest pain    EXAM:  PORTABLE CHEST - 1 VIEW    COMPARISON:  11/20/2013    FINDINGS:  There are mildbilateral chronic bronchitic changes. There is no  focal parenchymal opacity, pleural effusion, or pneumothorax. The  heart and mediastinal contours are unremarkable.    The osseous structures are unremarkable.     IMPRESSION:  Mild bilateral chronicbronchitic changes. Mild superimposed  interstitial edema or infection not excluded.      Electronically Signed    By: Kathreen Devoid    On: 05/16/2014 11:24         Verified By: Jennette Banker, M.D., MD    08-Aug-15 09:55, Chest PA and Lateral  Chest PA and Lateral   REASON FOR EXAM:    post pacemaker  COMMENTS:       PROCEDURE: DXR - DXR CHEST PA (OR AP) AND LATERAL  - May 18 2014  9:55AM     CLINICAL DATA:  Pacemaker placement.    EXAM:  CHEST  2 VIEW    COMPARISON:  One-view chest  05/16/2014    FINDINGS:  The heart size is normal. Lungs are clear. Emphysematous changes are  noted.  A dual-chamber pacemaker has been placed via a left subclavian  approach. The leads are in the right atrium and right ventricle.  There is no pneumothorax. Scratch the there is no pneumothorax or  pneumomediastinum. Rightward curvature of the thoracic spine is  noted. There is some straightening of the normal cervical kyphosis.     IMPRESSION:  1. Interval placement of dual-chamber pacemaker without radiographic  evidence for complication.  2. Emphysema.      Electronically Signed    By: Lawrence Santiago M.D.    On: 05/18/2014 11:01     Verified By: Resa Miner. MATTERN, M.D.,  CT:    06-Aug-15 11:55, CT Cervical Spine Without Contrast  CT Cervical Spine Without Contrast   REASON FOR EXAM:    fall  COMMENTS:       PROCEDURE: CT  - CT CERVICAL SPINE WO  - May 16 2014 11:55AM     CLINICAL DATA:  Syncopal episodes for 2 days.    EXAM:  CT HEAD WITHOUT CONTRAST    CT CERVICAL SPINE WITHOUT CONTRAST    TECHNIQUE:  Multidetector CT imaging of the head and cervical spine was  performed following the standard protocol without intravenous  contrast. Multiplanar CT image reconstructions of the cervical spine  were also generated.    COMPARISON:  12/03/2013    FINDINGS:  CT HEAD FINDINGS  Ventricles are normal in overall configuration. There is ventricular  enlargement greater than sulcal enlargement, which is stable from  the prior exam this is consistent with moderate atrophy. No  convincing hydrocephalus.    No parenchymal masses or mass effect. No evidence of a recent  cortical infarct. Patchy white matter hypoattenuation is noted  consistent with moderate chronic microvascular ischemic change.    No extra-axial masses or abnormal fluid collections.    No intracranial hemorrhage.    Visualized sinuses and mastoid air cells are clear.    CT CERVICAL SPINE  FINDINGS    No fracture. No spondylolisthesis. There are degenerative changes  from C3-C4 through C6-C7 with loss of disc height and endplate  spurring. Bones are demineralized. Soft tissues are unremarkable.  Lung apices are clear.   IMPRESSION:  HEAD CT: No acute intracranial abnormalities. Atrophy and chronic  microvascular ischemic change, stable.    CERVICAL CT:  No fracture or acute finding.      Electronically Signed    By: Lajean Manes M.D.    On: 05/16/2014 12:16         Verified By: Lasandra Beech, M.D.,    06-Aug-15 11:55, CT Head Without Contrast  CT Head Without Contrast   REASON FOR EXAM:    fall, syncope  COMMENTS:       PROCEDURE: CT  - CT HEAD WITHOUT CONTRAST  - May 16 2014 11:55AM     CLINICAL DATA:  Syncopal episodes for 2 days.    EXAM:  CT HEAD WITHOUT CONTRAST    CT CERVICAL SPINE WITHOUT CONTRAST    TECHNIQUE:  Multidetector CT imaging of the head and cervical spine was  performed following the standard protocol without intravenous  contrast. Multiplanar CT image reconstructions of the cervical spine  were also generated.    COMPARISON:  12/03/2013    FINDINGS:  CT HEAD FINDINGS    Ventricles are normal in overall configuration. There is ventricular  enlargement greater than sulcal enlargement, which is stable from  the prior exam this is consistent with moderate atrophy. No  convincing hydrocephalus.    No parenchymal masses or mass effect. No evidence of a recent  cortical infarct. Patchy white matter hypoattenuation is noted  consistent with moderate chronic microvascular ischemic change.    No extra-axial masses or abnormal fluid collections.    No intracranial hemorrhage.    Visualized sinuses and mastoid air cells are clear.    CT CERVICAL SPINE FINDINGS    No fracture. No spondylolisthesis. There are degenerative changes  from C3-C4 through C6-C7 with loss of disc height and endplate  spurring. Bones are demineralized.  Soft tissues are unremarkable.  Lung apices are clear.   IMPRESSION:  HEAD CT: No acute intracranial abnormalities. Atrophy and chronic  microvascular ischemic change, stable.    CERVICAL CT:  No fracture or acute finding.      Electronically Signed    By: Lajean Manes M.D.    On: 05/16/2014 12:16         Verified By: Lasandra Beech, M.D.,   Pertinent Past History:  Pertinent Past History Hyperlipidemia   Hospital Course:  Hospital Course Patient underwent pacemaker placement. She was monitored in CCU overnight. Subsequently tranferred to the floor. Pravastatin was continued for hyperlipidemia. She received Pantoprazole for GI prophylaxis. Patient was able to walk to the bathroom and in her room without any complaints of dizziness today. She is eager to go home. Exam: Alert, oriented,  NAD, Chest: clear, CVS: RRR, Abdo: benign, Ext: no edema. Plan is to discharge home today.   Condition on Discharge Stable   DISCHARGE INSTRUCTIONS HOME MEDS:  Medication Reconciliation: Patient's Home Medications at Discharge:     Medication Instructions  pravastatin 20 mg oral tablet  1 tab(s) orally once a day (at bedtime)   citalopram 40 mg oral tablet  1 tab(s) orally once a day   calcium 600+d 600 mg-200 units oral tablet  1 tab(s) orally 2 times a day   folic acid 1 mg oral tablet  1 tab(s) orally once a day   donepezil 5 mg oral tablet  1 tab(s) orally once a day (at bedtime)   methocarbamol 500 mg oral tablet  1 tab(s) orally 3 times a day   aleve sodium 220 mg oral tablet  2 tab(s) orally once a day   voltaren topical 1% topical gel  Apply topically to affected area , As Needed - for Pain   oxybutynin 5 mg/24 hours oral tablet, extended release  1 tab(s) orally once a day   levaquin 250 mg oral tablet  1 tab(s) orally every 24 hours     Physician's Instructions:  Diet Low Fat, Low Cholesterol   Activity Limitations As tolerated   Return to Work Not Applicable   Time frame  for Follow Up Appointment 1-2 weeks  [facility] cardiology   Time frame for Follow Up Appointment 1-2 weeks  Dr. Glendon Axe   Other Comments Take Levoquin 250 mg daily for three days for UTI     Candiss Norse, Jaimya Feliciano(Attending Physician): [facility], [address], [place], [place] [address], [place] [phone]   Paraschos, Alexander(Consultant): [facility], [address] [place], [place] [address], [place] [phone]  Electronic Signatures: Glendon Axe (MD)  (Signed 09-Aug-15 09:39)  Authored: ADMISSION DATE AND DIAGNOSIS, CHIEF COMPLAINT/HPI, Allergies, PERTINENT LABS, PERTINENT RADIOLOGY STUDIES, PERTINENT PAST HISTORY, HOSPITAL COURSE, DISCHARGE INSTRUCTIONS HOME MEDS, PATIENT INSTRUCTIONS, Follow Up Physician   Last Updated: 09-Aug-15 09:39 by Glendon Axe (MD)

## 2015-02-01 NOTE — Consult Note (Signed)
Chief Complaint:  Subjective/Chief Complaint Patient is status post permanent pacemaker placement yesterday. she complains of mi soreness in her left shoulder but feels fine. she has ambulated  he had and is eating breakfast right now. she will walk with her door or the nurse soon. no fever no bleeding   VITAL SIGNS/ANCILLARY NOTES: **Vital Signs.:   08-Aug-15 06:00  Vital Signs Type Routine  Temperature Temperature (F) 98  Celsius 36.6  Temperature Source oral  Pulse Pulse 60  Respirations Respirations 16  Systolic BP Systolic BP 448  Diastolic BP (mmHg) Diastolic BP (mmHg) 63  Mean BP 83  Pulse Ox % Pulse Ox % 94  Pulse Ox Activity Level  At rest  Oxygen Delivery Room Air/ 21 %  *Intake and Output.:   Daily 08-Aug-15 07:00  Grand Totals Intake:  840 Output:      Net:  840 24 Hr.:  840  Oral Intake      In:  840  Length of Stay Totals Intake:  1880 Output:  275    Net:  1856   Brief Assessment:  GEN well developed, well nourished, no acute distress, thin   Cardiac Regular  -- LE edema  -- JVD   Respiratory normal resp effort  clear BS   Gastrointestinal Normal   Gastrointestinal details normal Soft  Nontender  Nondistended   EXTR negative cyanosis/clubbing, negative edema   Lab Results: LabObservation:  06-Aug-15 17:06   OBSERVATION Reason for Test  Hepatic:  06-Aug-15 10:57   Bilirubin, Total 0.6  Alkaline Phosphatase 101 (46-116 NOTE: New Reference Range 04/30/14)  SGPT (ALT) 18 (14-63 NOTE: New Reference Range 04/30/14)  SGOT (AST) 21  Total Protein, Serum 7.2  Albumin, Serum 3.4  08-Aug-15 04:25   Bilirubin, Total 0.6  Alkaline Phosphatase 91 (46-116 NOTE: New Reference Range 04/30/14)  SGPT (ALT) 16 (14-63 NOTE: New Reference Range 04/30/14)  SGOT (AST) 17  Total Protein, Serum 6.6  Albumin, Serum  3.0  Cardiology:  06-Aug-15 10:20   Ventricular Rate 62  Atrial Rate 62  P-R Interval 202  QRS Duration 140  QT 496  QTc 503  P Axis 42   R Axis 26  T Axis 52  ECG interpretation Normal sinus rhythm with sinus arrhythmia Right bundle branch block Abnormal ECG When compared with ECG of 11-Jan-2014 09:27, Vent. rate has decreased BY  37 BPM (unconfirmed) Confirmed by OVERREAD, NOT ([id number]), editor BAILEY, TAMMY ([id number]) on 05/17/2014 8:39:21 AM    17:06   Echo Doppler REASON FOR EXAM:     COMMENTS:     PROCEDURE: [place] - ECHO DOPPLER COMPLETE(TRANSTHOR)  - May 16 2014  5:06PM   RESULT: Echocardiogram Report  Patient Name:   Kerri [name] Date of Exam: 05/16/2014 Medical Rec #:  [id number]           Custom1: Date of Birth:  June 25, 1931        Height:       68.0 in Patient Age:    81 years         Weight:       160.0 lb Patient Gender: F                BSA:          1.86 m??  Indications: Syncope Sonographer:    Arville Go RDCS Referring Phys: Glendon Axe  Sonographer Comments: Suboptimal parasternal window and suboptimal  subcostal window.  Summary:  1. Left ventricular ejection fraction, by visual estimation,  is 55 to  60%.  2. Normal global left ventricular systolic function.  3. Mild mitral valve regurgitation.  4. Mild tricuspid regurgitation. 2D AND M-MODE MEASUREMENTS (normal ranges within parentheses): Left Ventricle:          Normal IVSd (2D):      1.15 cm (0.7-1.1) LVPWd (2D):     1.09 cm (0.7-1.1) Aorta/LA:         Normal LVIDd (2D):     3.71 cm (3.4-5.7) Aortic Root (2D): 2.40 cm (2.4-3.7) LVIDs (2D):     2.59 cm           Left Atrium (2D): 2.60 cm (1.9-4.0) LV FS (2D):     30.2 %   (>25%) LV EF (2D):     58.3 %   (>50%)         Right Ventricle:                                   RVd (2D): LV DIASTOLIC FUNCTION: MV Peak E: 0.32 m/s Decel Time: 280 msec MV Peak A: 0.46 m/s E/A Ratio: 0.68 SPECTRAL DOPPLER ANALYSIS (where applicable): Mitral Valve: MV P1/2 Time:81.20 msec MV Area, PHT: 2.71 cm?? Aortic Valve: AoV Max Vel: 1.33 m/s AoV Peak PG: 7.1 mmHg AoV Mean PG: LVOT Vmax: 1.25 m/s LVOT VTI:   LVOT Diameter: 1.90 cm AoV Area, Vmax: 2.66 cm?? AoV Area, VTI:  AoV Area, Vmn: Tricuspid Valve and PA/RV Systolic Pressure: TR Max Velocity: 2.29 m/s RA  Pressure: 5 mmHg RVSP/PASP: 26.0 mmHg Pulmonic Valve: PV Max Velocity: 0.94 m/s PV Max PG: 3.5 mmHg PV Mean PG:  PHYSICIAN INTERPRETATION: Left Ventricle: The left ventricular internal cavity size wasnormal. LV  septal wall thickness was normal. LV posterior wall thickness was normal.  Global LV systolic function was normal. Left ventricular ejection  fraction, by visual estimation, is 55 to 60%. Right Ventricle: The right ventricular size isnormal. Global RV systolic  function is normal. Left Atrium: The left atrium is normal in size. Right Atrium: The right atrium is normal in size. Pericardium: There is no evidence of pericardial effusion. Mitral Valve: The mitral valve is normalin structure. Mild mitral valve  regurgitation is seen. Tricuspid Valve: The tricuspid valve is normal. Mild tricuspid  regurgitation is visualized. The tricuspid regurgitant velocity is 2.29  m/s, and with an assumed right atrial pressure of 5 mmHg, the estimated  right ventricular systolic pressure is normal at 26.0 mmHg. Aortic Valve: The aortic valve is normal. Pulmonic Valve: The pulmonic valve is normal.  [place] MD Electronically signed by [place] MD Signature Date/Time: 05/18/2014/9:26:51 AM *** Final ***  IMPRESSION: .    Verified By: Yolonda Kida, M.D., MD  Routine Chem:  06-Aug-15 10:57   Glucose, Serum 92  BUN 16  Creatinine (comp) 1.07  Sodium, Serum 140  Potassium, Serum 3.9  Chloride, Serum 107  CO2, Serum 29  Calcium (Total), Serum 8.8  Osmolality (calc) 280  eGFR (African American)  57  eGFR (Non-African American)  49 (eGFR values <77m/min/1.73 m2 may be an indication of chronic kidney disease (CKD). Calculated eGFR is useful in patients with stable renal function. The eGFR calculation  will not be reliable in acutely ill patients when serum creatinine is changing rapidly. It is not useful in  patients on dialysis. The eGFR calculation may not be applicable to patients at the low and high extremes of  body sizes, pregnant women, and vegetarians.)  Anion Gap  4    23:50   Glucose, Serum  100  BUN 15  Creatinine (comp) 1.16  Sodium, Serum 142  Potassium, Serum 3.5  Chloride, Serum 105  CO2, Serum 26  Calcium (Total), Serum 8.6  Osmolality (calc) 284  eGFR (African American)  51  eGFR (Non-African American)  44 (eGFR values <59m/min/1.73 m2 may be an indication of chronic kidney disease (CKD). Calculated eGFR is useful in patients with stable renal function. The eGFR calculation will not be reliable in acutely ill patients when serum creatinine is changing rapidly. It is not useful in  patients on dialysis. The eGFR calculation may not be applicable to patients at the low and high extremes of body sizes, pregnant women, and vegetarians.)  Anion Gap 11  08-Aug-15 04:25   Glucose, Serum  112  BUN 16  Creatinine (comp) 1.16  Sodium, Serum 139  Potassium, Serum  3.4  Chloride, Serum 106  CO2, Serum 26  Calcium (Total), Serum  8.2  Osmolality (calc) 279  eGFR (African American)  51  eGFR (Non-African American)  44 (eGFR values <667mmin/1.73 m2 may be an indication of chronic kidney disease (CKD). Calculated eGFR is useful in patients with stable renal function. The eGFR calculation will not be reliable in acutely ill patients when serum creatinine is changing rapidly. It is not useful in  patients on dialysis. The eGFR calculation may not be applicable to patients at the low and high extremes of body sizes, pregnant women, and vegetarians.)  Anion Gap 7  Cardiac:  06-Aug-15 10:57   Troponin I < 0.02 (0.00-0.05 0.05 ng/mL or less: NEGATIVE  Repeat testing in 3-6 hrs  if clinically indicated. >0.05 ng/mL: POTENTIAL  MYOCARDIAL INJURY. Repeat  testing in  3-6 hrs if  clinically indicated. NOTE: An increase or decrease  of 30% or more on serial  testing suggests a  clinically important change)    21:30   Troponin I < 0.02 (0.00-0.05 0.05 ng/mL or less: NEGATIVE  Repeat testing in 3-6 hrs  if clinically indicated. >0.05 ng/mL: POTENTIAL  MYOCARDIAL INJURY. Repeat  testing in 3-6 hrs if  clinically indicated. NOTE: An increase or decrease  of 30% or more on serial  testing suggests a  clinically important change)    23:50   Troponin I < 0.02 (0.00-0.05 0.05 ng/mL or less: NEGATIVE  Repeat testing in 3-6 hrs  if clinically indicated. >0.05 ng/mL: POTENTIAL  MYOCARDIAL INJURY. Repeat  testing in 3-6 hrs if  clinically indicated. NOTE: An increase or decrease  of 30% or more on serial  testing suggests a  clinically important change)  Routine UA:  06-Aug-15 12:06   Color (UA) Yellow  Clarity (UA) Clear  Glucose (UA) Negative  Bilirubin (UA) Negative  Ketones (UA) Negative  Specific Gravity (UA) 1.010  Blood (UA) 1+  pH (UA) 6.0  Protein (UA) Negative  Nitrite (UA) Positive  Leukocyte Esterase (UA) Trace (Result(s) reported on 16 May 2014 at 12:26PM.)  RBC (UA) 4 /HPF  WBC (UA) 21 /HPF  Bacteria (UA) 1+  Epithelial Cells (UA) <1 /HPF (Result(s) reported on 16 May 2014 at 12:26PM.)  Routine Coag:  06-Aug-15 21:30   Prothrombin 13.6  INR 1.1 (INR reference interval applies to patients on anticoagulant therapy. A single INR therapeutic range for coumarins is not optimal for all indications; however, the suggested range for most indications is 2.0 - 3.0. Exceptions to the INR Reference Range may  include: Prosthetic heart valves, acutemyocardial infarction, prevention of myocardial infarction, and combinations of aspirin and anticoagulant. The need for a higher or lower target INR must be assessed individually. Reference: The Pharmacology and Management of the Vitamin K  antagonists: the seventh ACCP Conference on  Antithrombotic and Thrombolytic Therapy. XLKGM.0102 Sept:126 (3suppl): [id number]. A HCT value >55% may artifactually increase the PT.  In one study,  the increase was an average of 25%. Reference:  "Effect on Routine and Special Coagulation Testing Values of Citrate Anticoagulant Adjustment in Patients with High HCT Values." American Journal of Clinical Pathology 2006;126:400-405.)  Activated PTT (APTT) 30.8 (A HCT value >55% may artifactually increase the APTT. In one study, the increase was an average of 19%. Reference: "Effect on Routine and Special Coagulation Testing Values of Citrate Anticoagulant Adjustment in Patients with High HCT Values." American Journal of Clinical Pathology 2006;126:400-405.)  Routine Hem:  06-Aug-15 10:57   WBC (CBC) 8.1  RBC (CBC) 5.14  Hemoglobin (CBC) 13.7  Hematocrit (CBC) 42.3  Platelet Count (CBC) 215 (Result(s) reported on 16 May 2014 at 11:17AM.)  MCV 82  MCH 26.6  MCHC 32.4  RDW  14.7    23:50   WBC (CBC)  11.3  RBC (CBC) 4.99  Hemoglobin (CBC) 13.3  Hematocrit (CBC) 40.8  Platelet Count (CBC) 201  MCV 82  MCH 26.7  MCHC 32.7  RDW  14.7  Neutrophil % 64.9  Lymphocyte % 24.3  Monocyte % 6.4  Eosinophil % 3.7  Basophil % 0.7  Neutrophil #  7.3  Lymphocyte # 2.7  Monocyte # 0.7  Eosinophil # 0.4  Basophil # 0.1 (Result(s) reported on 17 May 2014 at 12:05AM.)  08-Aug-15 04:25   WBC (CBC) 9.8  RBC (CBC) 4.93  Hemoglobin (CBC) 13.4  Hematocrit (CBC) 40.8  Platelet Count (CBC) 185  MCV 83  MCH 27.2  MCHC 32.9  RDW 14.5  Neutrophil % 65.8  Lymphocyte % 23.4  Monocyte % 7.9  Eosinophil % 2.4  Basophil % 0.5  Neutrophil # 6.5  Lymphocyte # 2.3  Monocyte # 0.8  Eosinophil # 0.2  Basophil # 0.1 (Result(s) reported on 18 May 2014 at 05:11AM.)   Radiology Results: XRay:    06-Aug-15 11:16, Chest Portable Single View  Chest Portable Single View   REASON FOR EXAM:    Chest pain  COMMENTS:       PROCEDURE: DXR - DXR  PORTABLE CHEST SINGLE VIEW  - May 16 2014 11:16AM     CLINICAL DATA:  Dizziness, chest pain    EXAM:  PORTABLE CHEST - 1 VIEW    COMPARISON:  11/20/2013    FINDINGS:  There are mildbilateral chronic bronchitic changes. There is no  focal parenchymal opacity, pleural effusion, or pneumothorax. The  heart and mediastinal contours are unremarkable.    The osseous structures are unremarkable.     IMPRESSION:  Mild bilateral chronicbronchitic changes. Mild superimposed  interstitial edema or infection not excluded.      Electronically Signed    By: Kathreen Devoid    On: 05/16/2014 11:24         Verified By: Jennette Banker, M.D., MD  Cardiology:    06-Aug-15 10:20, ED ECG  Ventricular Rate 62  Atrial Rate 62  P-R Interval 202  QRS Duration 140  QT 496  QTc 503  P Axis 42  R Axis 26  T Axis 52  ECG interpretation   Normal sinus rhythm with sinus arrhythmia  Right  bundle branch block  Abnormal ECG  When compared with ECG of 11-Jan-2014 09:27,  Vent. rate has decreased BY  37 BPM  (unconfirmed)  Confirmed by OVERREAD, NOT ([id number]), editor BAILEY, TAMMY ([id number]) on 05/17/2014 8:39:21 AM  ED ECG     06-Aug-15 17:06, Echo Doppler  Echo Doppler   REASON FOR EXAM:      COMMENTS:       PROCEDURE: [facility] - ECHO DOPPLER COMPLETE(TRANSTHOR)  - May 16 2014  5:06PM     RESULT: Echocardiogram Report    Patient Name:   Kerri [name] Date of Exam: 05/16/2014  Medical Rec #:  [id number]           Custom1:  Date of Birth:  1931/04/28        Height:       68.0 in  Patient Age:    35 years         Weight:       160.0 lb  Patient Gender: F                BSA:          1.86 m??    Indications: Syncope  Sonographer:    Arville Go RDCS  Referring Phys: Glendon Axe    Sonographer Comments: Suboptimal parasternal window and suboptimal   subcostal window.    Summary:   1. Left ventricular ejection fraction, by visual estimation, is 55 to   60%.   2. Normal global left ventricular systolic  function.   3. Mild mitral valve regurgitation.   4. Mild tricuspid regurgitation.  2D AND M-MODE MEASUREMENTS (normal ranges within parentheses):  Left Ventricle:          Normal  IVSd (2D):      1.15 cm (0.7-1.1)  LVPWd (2D):     1.09 cm (0.7-1.1) Aorta/LA:         Normal  LVIDd (2D):     3.71 cm (3.4-5.7) Aortic Root (2D): 2.40 cm (2.4-3.7)  LVIDs (2D):     2.59 cm           Left Atrium (2D): 2.60 cm (1.9-4.0)  LV FS (2D):     30.2 %   (>25%)  LV EF (2D):     58.3 %   (>50%)          Right Ventricle:                                    RVd (2D):  LV DIASTOLIC FUNCTION:  MV Peak E: 0.32 m/s Decel Time: 280 msec  MV Peak A: 0.46 m/s  E/A Ratio: 0.68  SPECTRAL DOPPLER ANALYSIS (where applicable):  Mitral Valve:  MV P1/2 Time:81.20 msec  MV Area, PHT: 2.71 cm??  Aortic Valve: AoV Max Vel: 1.33 m/s AoV Peak PG: 7.1 mmHg AoV Mean PG:  LVOT Vmax: 1.25 m/s LVOT VTI:  LVOT Diameter: 1.90 cm  AoV Area, Vmax: 2.66 cm?? AoV Area, VTI:  AoV Area, Vmn:  Tricuspid Valve and PA/RV Systolic Pressure: TR Max Velocity: 2.29 m/s RA   Pressure: 5 mmHg RVSP/PASP: 26.0 mmHg  Pulmonic Valve:  PV Max Velocity: 0.94 m/s PV Max PG: 3.5 mmHg PV Mean PG:    PHYSICIAN INTERPRETATION:  Left Ventricle: The left ventricular internal cavity size wasnormal. LV   septal wall thickness was normal. LV posterior wall thickness was normal.   Global LV systolic function  was normal. Left ventricular ejection   fraction, by visual estimation, is 55 to 60%.  Right Ventricle: The right ventricular size isnormal. Global RV systolic   function is normal.  Left Atrium: The left atrium is normal in size.  Right Atrium: The right atrium is normal in size.  Pericardium: There is no evidence of pericardial effusion.  Mitral Valve: The mitral valve is normalin structure. Mild mitral valve   regurgitation is seen.  Tricuspid Valve: The tricuspid valve is normal. Mild tricuspid   regurgitation is visualized. The tricuspid  regurgitant velocity is 2.29   m/s, and with an assumed right atrial pressure of 5 mmHg, the estimated   right ventricular systolic pressure is normal at 26.0 mmHg.  Aortic Valve: The aortic valve is normal.  Pulmonic Valve: The pulmonic valve is normal.    [place] MD  Electronically signed by [place] MD  Signature Date/Time: 05/18/2014/9:26:51 AM  *** Final ***    IMPRESSION: .        Verified By: Yolonda Kida, M.D., MD  CT:    06-Aug-15 11:55, CT Cervical Spine Without Contrast  CT Cervical Spine Without Contrast   REASON FOR EXAM:    fall  COMMENTS:       PROCEDURE: CT  - CT CERVICAL SPINE WO  - May 16 2014 11:55AM     CLINICAL DATA:  Syncopal episodes for 2 days.    EXAM:  CT HEAD WITHOUT CONTRAST    CT CERVICAL SPINE WITHOUT CONTRAST    TECHNIQUE:  Multidetector CT imaging of the head and cervical spine was  performed following the standard protocol without intravenous  contrast. Multiplanar CT image reconstructions of the cervical spine  were also generated.    COMPARISON:  12/03/2013    FINDINGS:  CT HEAD FINDINGS    Ventricles are normal in overall configuration. There is ventricular  enlargement greater than sulcal enlargement, which is stable from  the prior exam this is consistent with moderate atrophy. No  convincing hydrocephalus.    No parenchymal masses or mass effect. No evidence of a recent  cortical infarct. Patchy white matter hypoattenuation is noted  consistent with moderate chronic microvascular ischemic change.    No extra-axial masses or abnormal fluid collections.    No intracranial hemorrhage.    Visualized sinuses and mastoid air cells are clear.    CT CERVICAL SPINE FINDINGS    No fracture. No spondylolisthesis. There are degenerative changes  from C3-C4 through C6-C7 with loss of disc height and endplate  spurring. Bones are demineralized. Soft tissues are unremarkable.  Lung apices are clear.    IMPRESSION:  HEAD CT: No acute intracranial abnormalities. Atrophy and chronic  microvascular ischemic change, stable.    CERVICAL CT:  No fracture or acute finding.      Electronically Signed    By: Lajean Manes M.D.    On: 05/16/2014 12:16         Verified By: Lasandra Beech, M.D.,    06-Aug-15 11:55, CT Head Without Contrast  CT Head Without Contrast   REASON FOR EXAM:    fall, syncope  COMMENTS:       PROCEDURE: CT  - CT HEAD WITHOUT CONTRAST  - May 16 2014 11:55AM     CLINICAL DATA:  Syncopal episodes for 2 days.    EXAM:  CT HEAD WITHOUT CONTRAST    CT CERVICAL SPINE WITHOUT CONTRAST    TECHNIQUE:  Multidetector CT  imaging of the head and cervical spine was  performed following the standard protocol without intravenous  contrast. Multiplanar CT image reconstructions of the cervical spine  were also generated.    COMPARISON:  12/03/2013    FINDINGS:  CT HEAD FINDINGS    Ventricles are normal in overall configuration. There is ventricular  enlargement greater than sulcal enlargement, which is stable from  the prior exam this is consistent with moderate atrophy. No  convincing hydrocephalus.    No parenchymal masses or mass effect. No evidence of a recent  cortical infarct. Patchy white matter hypoattenuation is noted  consistent with moderate chronic microvascular ischemic change.    No extra-axial masses or abnormal fluid collections.    No intracranial hemorrhage.    Visualized sinuses and mastoid air cells are clear.    CT CERVICAL SPINE FINDINGS    No fracture. No spondylolisthesis. There are degenerative changes  from C3-C4 through C6-C7 with loss of disc height and endplate  spurring. Bones are demineralized. Soft tissues are unremarkable.  Lung apices are clear.   IMPRESSION:  HEAD CT: No acute intracranial abnormalities. Atrophy and chronic  microvascular ischemic change, stable.    CERVICAL CT:  No fracture or acute  finding.      Electronically Signed    By: Lajean Manes M.D.    On: 05/16/2014 12:16         Verified By: Lasandra Beech, M.D.,   Assessment/Plan:  Assessment/Plan:  Assessment IMP  sick sinus syndrome  postop day 1 permanent pacemaker  hyperlipidemia  GERD  DJD .   Plan PLAN  chest x-ray today for  ambulate in hall today  if patient does well will discharge  today  follow-up with cardiologist in 1-2 weeks   Electronic Signatures: Yolonda Kida (MD)  (Signed 08-Aug-15 09:51)  Authored: Chief Complaint, VITAL SIGNS/ANCILLARY NOTES, Brief Assessment, Lab Results, Radiology Results, Assessment/Plan   Last Updated: 08-Aug-15 09:51 by Lujean Amel D (MD)

## 2015-02-02 NOTE — Discharge Summary (Signed)
PATIENT NAME:  Kerri [name], Kerri [name] MR#:  [id number] DATE OF BIRTH:  06/20/1931  DATE OF ADMCassie FreerSSION:  01/30/2012 DATE OF DISCHARGE:  02/01/2012   ADMITTING DIAGNOSIS: Pelvic fracture.   DISCHARGE DIAGNOSES:  1. Pelvic fracture right upper as well as pelvic rami fracture, inability to ambulate due to pain.  2. Acute respiratory failure due to atelectasis versus questionable bacterial pneumonia, improving.  3. Leukocytosis.  4. Hyperglycemia. 5. History of depression. 6. Hyperlipidemia. 7. Osteopenia.   DISCHARGE CONDITION: Stable.   DISCHARGE MEDICATIONS: The patient is to resume her outpatient medications which are:  1. Calcium carbonate 1 gram once daily. 2. Folic acid 1 mg p.o. daily. 3. Pravastatin 20 mg p.o. at bedtime. 4. Citalopram 40 mg p.o. daily.   ADDITIONAL MEDICATIONS:  1. Levaquin 500 mg p.o. daily for seven days. 2. Percocet 5/325 mg 1 to 2 tablets every four hours as needed. 3. Tylenol 650 mg p.o. every four hours as needed.  4. Calcium carbonate 500 mg with Vitamin D 2000 units 1 tablet p.o. twice daily. 5. Norflex 100 mg p.o. twice daily.   HOME OXYGEN: 1 liter of oxygen through nasal cannula. The patient is to have oxygen weaned down to room air as tolerated. She was also advised to use incentive spirometry every one hour.   DIET: Mechanical soft, low fat, low cholesterol.   ACTIVITY LIMITATIONS: As tolerated. The patient was advised to have partial weightbearing on the right with a walker by Dr. Martha ClanKrasinski.  FOLLOW-UP: The patient is to follow-up with Dr. Martha ClanKrasinski in two days after discharge as well as Dr. Alison MurrayAndreassi, primary care physician, in two days after discharge.  CONSULTANT: Dr. Martha ClanKrasinski    RADIOLOGIC STUDIES: Left hip complete 01/30/2012 showed right pubic ramus fracture. Correlate with images of the pubic bones and possibly right hip according to the radiologist.   Pelvic AP only 01/30/2012 superior and inferior pubic rami fracture on the  right.  Chest, portable, single view, 04/22/213, minimal left lung base atelectasis with chronic fibrotic changes. No definite acute cardiopulmonary disease otherwise.   Right hip complete 01/31/2012 inferior and superior pubic rami fractures on the right. These appear to be acute.   CT of chest for pulmonary embolism with IV contrast on 01/31/2012 no CT evidence of pulmonary arterial embolic disease. Mild atelectasis versus mild infiltrate within the lung bases. Findings which may represent a component of pulmonary vascular congestion. Indeterminate low attenuating nodule within the dome of the liver.  REASON FOR ADMISSION: The patient is a 81 year old Caucasian female with history of osteopenia, hyperlipidemia, as well as depression who presented to the hospital after tripping over laundry basket and landing on the left side of her body. She injured her hips as well as legs and was brought to the hospital for further evaluation. She was complaining of severe pain and inability to ambulate. In the Emergency Room she was noted to have pelvic fractures and was admitted for evaluation and treatment. She was seen by Dr. Martha ClanKrasinski on 01/31/2012 who looked at radiographs of hip as well as pelvis and revealed comminuted but minimally displaced fracture of superior ramus and nondisplaced fracture of inferior ramus on the right side. There was no evidence of hip fractures or left-sided pelvic fractures according to Dr. Martha ClanKrasinski. He recommended to continue patient evaluation by physical therapy and start partial weightbearing on the right with a walker and get placement to rehab. He also recommended to follow-up with him in one month for re-evaluation after discharge. The patient was  followed by physical therapy while in the hospital. She was continued on pain medications and she did satisfactory. She was also advised to continue Cardizem as well as Vitamin D supplements. She is to follow-up with her primary care  physician in the next few days after discharge for further recommendations. Her pain was satisfactorily controlled today on 02/01/2012. She didn't require too much pain medications. She is continue Percocet as well as Tylenol as needed as well as Norflex which seems to be working for her quite well.   In regards to respiratory issues, the patient does not have history of coronary artery disease or CHF or ever had problems with lungs or COPD or smoking. However, on arrival to the Emergency Room she was placed on 3 liters of oxygen through nasal cannula. Later on probably because of morphine use her oxygen requirement went up to 4 liters a minute. Chest x-ray failed to show any significant abnormalities. The patient proceeded to CT scan of her chest which was negative for pulmonary embolism, however, showed atelectasis versus pneumonia at the bases. Because her white blood cell count was slightly elevated, the patient was started on antibiotic therapy in the hospital. She is to continue antibiotics for seven days to complete course. She was also started on incentive spirometry and her oxygen requirement slowly decreased. By day of discharge she is just on 1 liter of oxygen through nasal cannula down from 4.5 liters just a day ago signifying that very likely the patient's hypoxia was related to poor inspiratory effort, possibly morphine use, as well as possibly atelectasis more likely than pneumonia.   VITAL SIGNS ON THE DAY OF DISCHARGE: Temperature 98.3, pulse 75, respiration rate 18, blood pressure 118/71, saturation 91% on 1 liter of oxygen through nasal cannula. The patient may need to be on higher oxygen flow whenever she is ambulating. She is recommended to be weaned down to room air as tolerated.   In regards to leukocytosis, the patient was noted to have mild leukocytosis initially on arrival to the hospital. White blood cell count was elevated to 11.8. The patient had urinalysis done which showed 21 red  blood cells with 9 white blood cell. Cultures were taken, however, the patient's urine culture was too small to read. As her white blood cell count increased to 13.3 even with no fevers and because of her chest x-ray showing some changes as well as hypoxia, decision was made to start her on antibiotic therapy. It is recommended to follow the patient's white blood cell count as outpatient and make sure that the patient's white blood cell count normalizes.    For history of depression, hyperlipidemia, as well as osteopenia, the patient is to continue her outpatient medications.  The patient was noted to be hyperglycemic in the hospital. On arrival to the hospital in the Emergency Room, she was noted to be hyperglycemic to 108. The patient's glucose level was high at 123 on 01/31/2012. It is recommended to get outpatient formal glucose tolerance testing to ensure that she does not have diabetes as outpatient. Unfortunately, it was not done here in the hospital.   As mentioned above, the patient is being discharged in stable condition with the above-mentioned medications and follow-up.   Her vital signs are stable. Temperature is 98.3, pulse 75, respiration rate 18, blood pressure 100/63, saturation 91% on room air as well as on 1 liter, however, goes up to 96 on four liter oxygen saturation oxygen through nasal cannula.   TIME SPENT:  40 minutes.   ____________________________ Katharina Caper, MD rv:drc D: 02/01/2012 15:11:18 ET T: 02/01/2012 15:49:40 ET JOB#: [id number]  cc: Katharina Caper, MD, <Dictator> Kathreen Devoid, MD Cecille Po. Alison Murray, MD Katharina Caper MD ELECTRONICALLY SIGNED 02/01/2012 20:07

## 2015-02-02 NOTE — H&P (Signed)
PATIENT NAME:  Kerri [name], Kerri [name] MR#:  [id number] DATE OF BIRTH:  Oct 20, 1930  DATE OF ADMISSION:  01/30/2012  REFERRING PHYSICIAN: Dr. Daphine Deutscher FAMILY PHYSICIAN: Dr. Alison Murray  REASON FOR ADMISSION: Pelvic fracture.   HISTORY OF PRESENT ILLNESS: The patient is a 81 year old female with a history of depression and hyperlipidemia followed but Dr. Alison Murray who was in her usual state of health until this morning when she tripped over her laundry basket injuring her hips and legs. She was brought to the Emergency Room because of severe pain and inability to ambulate. In the Emergency Room, the patient was noted to have a pelvic fracture and is now admitted for further evaluation.   PAST MEDICAL HISTORY:  1. Osteopenia.  2. Depression.  3. Hyperlipidemia.   MEDICATIONS:  1. Pravastatin, dose unknown.  2. Celexa, dose unknown.  3. Os-Cal D 1 p.o. b.i.d.   ALLERGIES: No known drug allergies.   SOCIAL HISTORY: Patient is married. No history of alcohol or tobacco abuse.   FAMILY HISTORY: Positive for hypertension and stroke.    REVIEW OF SYSTEMS: CONSTITUTIONAL: No fever or change in weight. EYES: No blurred or double vision. No glaucoma. ENT: No tinnitus or hearing loss. No nasal discharge or bleeding. No difficulty swallowing. RESPIRATORY: No cough or wheezing. Denies hemoptysis. No painful respiration. CARDIOVASCULAR: No chest pain or orthopnea. No palpitations or syncope. GASTROINTESTINAL: No nausea, vomiting, or diarrhea. No abdominal pain. No change in bowel habits. GENITOURINARY: No dysuria or hematuria. No incontinence. ENDOCRINE: No polyuria or polydipsia. No heat or cold intolerance. HEMATOLOGIC: Patient denies anemia, easy bruising, or bleeding. LYMPHATIC: No swollen glands. MUSCULOSKELETAL: Patient denies pain in her neck, shoulders, or knees. Does have back and hip pain. No gout. NEUROLOGIC: No numbness although she does feel weak. Denies migraines, stroke or seizures. PSYCH: Patient  denies anxiety, insomnia, or depression.   PHYSICAL EXAMINATION:  GENERAL: Patient is in no acute distress.   VITAL SIGNS: Vital signs remarkable for a blood pressure of 143/59 with a heart rate of 82 and a respiratory rate of 20. She is afebrile.   HEENT: Normocephalic, atraumatic. Pupils equally round and reactive to light and accommodation. Extraocular movements are intact. Sclerae are not icteric. Conjunctivae are clear. Oropharynx is clear.   NECK: Supple without jugular venous distention or bruits. No adenopathy or thyromegaly is noted.   LUNGS: Clear to auscultation and percussion without wheezes, rales, or rhonchi. No dullness.   CARDIAC: Regular rate and rhythm with normal S1 and S2. No significant rubs, murmurs, or gallops. PMI is nondisplaced. Chest wall is nontender.   ABDOMEN: Soft, nontender with normoactive bowel sounds. No organomegaly or masses were appreciated. No hernias or bruits were noted.   EXTREMITIES: Without clubbing, cyanosis, edema. Pulses were 2+ bilaterally.   SKIN: Warm and dry without rash or lesions.   NEUROLOGIC: Cranial nerves II through XII grossly intact. Deep tendon reflexes were symmetric. Motor and sensory examination is nonfocal.   PSYCH: Exam revealed a patient who was alert and oriented to person, place, and time. She was cooperative and used good judgment.   LABORATORY, DIAGNOSTIC AND RADIOLOGICAL DATA: CBC revealed a white count of 11.8 with a hemoglobin of 14.2. Glucose was 108 with a BUN of 12 and a creatinine of 0.97 with a sodium of 145 and a potassium of 3.9 with a GFR of 56. Pelvic films revealed superior and inferior pubic rami fracture on the right.   ASSESSMENT:  1. Pelvic fracture.  2. Inability to ambulate.  3. Severe pelvic pain.  4. Hyperlipidemia.  5. History of depression.   PLAN: Patient will be observed on orthopedics with morphine and Zofran as needed for pain and nausea. Will consult orthopedics and physical therapy.  Will also consult case manager for discharge planning. Continue her outpatient medications at this time. Follow up routine labs in the morning. Further treatment and evaluation will depend upon the patient's progress.   TOTAL TIME SPENT ON THIS PATIENT: 45 minutes.   ____________________________ Duane LopeJeffrey D. Judithann SheenSparks, MD jds:cms D: 01/30/2012 17:09:30 ET T: 01/31/2012 06:36:31 ET JOB#: [id number]  cc: Duane LopeJeffrey D. Judithann SheenSparks, MD, <Dictator> Maureen P. Alison MurrayAndreassi, MD JEFFREY Rodena Medin SPARKS MD ELECTRONICALLY SIGNED 01/31/2012 11:49

## 2015-02-02 NOTE — Discharge Summary (Signed)
PATIENT NAME:  Kerri [name], BALLESTER MR#:  [id number] DATE OF BIRTH:  12-26-1930  DATE OF ADMISSION:  02/01/2012 DATE OF DISCHARGE:  02/07/2012   DISCHARGE DIAGNOSES:  1. Acute respiratory failure most likely secondary to atelectasis, possible pneumonia, now improved.  2. Pelvic fracture. 3. Depression.  4. Osteopenia. 5. Hyperlipidemia. 6. Leukocytosis, resolved.   HOSPITAL COURSE: The patient is a 81 year old female who has history of osteopenia, hyperlipidemia, and depression. She was initially admitted on 01/30/2012 after a fall. She was found to have a pelvic fracture. Her x-ray of the pelvis from 01/30/2012 showed superior and inferior pubic rami fracture on the right. On April 22nd because of hypoxia, she had a CT of the chest done which showed the patient did not have any PE, mild atelectasis versus mild infiltrate at the lung bases may be a component of pulmonary vascular congestion. She was discharged on April 23rd but she came right back because of respiratory distress, hypoxia. She was admitted as acute respiratory failure maybe secondary to atelectasis versus infiltrate. Her chest x-ray when she was admitted again showed some mild pulmonary vascular congestion, maybe atelectasis in the region of the left lower lobe. She was started on Levaquin. She had completed about eight days of antibiotics already. Her chest is clear. Her FiO2 requirements have improved significantly. She is saturating 94% on room air. With exertion she drops to 88%. I am going to discharge her on 1 liter nasal cannula. This can be weaned as outpatient. She had an echocardiogram done which showed that the patient had EF of greater than 55%, some impaired LV relaxation, mild concentric left ventricular hypertrophy, RV function normal, trace MR, and right ventricular pressure is elevated in the range of 40 to 50 mmHg. She may have a component of mild pulmonary hypertension. Currently her chest is clear. She got one dose of IV  Lasix during the hospital stay but she does not appear to be in any congestive heart failure at this time. She ruled out for MI. When she presented she had a BNP minimally elevated at 609 which doesn't correspond with congestive heart failure. Her creatinine remained stable around 0.8. Her white count was minimally elevated at 11.2 when she came in but that normalized to 10.4 the next day. She was given Percocet for pain management and physical therapy was restarted on the patient. She is still having soreness but she is tolerating physical therapy. She does not have any shortness of breath. She is also complaining of some constipation because of narcotic medications and I am going to give her Senokot and MiraLAX at the nursing home.   MEDICATIONS AT DISCHARGE:  1. Pravastatin 20 mg at bedtime. 2. Citalopram 40 mg daily.  3. Tylenol Arthritis 650 mg q.4 to 6 hours as needed.  4. Percocet 5/325 1 to 2 tabs p.o. q.4 to 6 hours as needed for pain.  5. Calcium carbonate with Vitamin D 500 mg/200 units 1 tab p.o. b.i.d.  6. Folic acid 1 mg p.o. once daily. 7. Senokot 1 tab p.o. b.i.d.  8. MiraLAX 17 grams p.o. once a day as needed for constipation. 9. Incentive spirometry q.3 hours. 10. She already completed her Levaquin.   CONDITION AT DISCHARGE: She is comfortable. T-max 98.3, heart rate 79, blood pressure 115/62, saturating 95% on room air and 88% with exertion. She is 96% on one liter. We will discharge her on one liter nasal cannula and this can be weaned off as outpatient.   ACTIVITY: PT  at rehab.  DIET: Regular diet.   FOLLOW-UP:  1. The patient should follow-up with Dr. Alison MurrayAndreassi, MD at SNF, in one week.  2. Follow-up with Dr. Martha ClanKrasinski in three weeks. 3. Possibly because her echo showed some pulmonary hypertension, she can be referred to Pulmonology from Dr. Virl SonAndreassi's office if needed.   I updated the family at the time of discharge.  TIME SPENT WITH THE DISCHARGE: 50 minutes.    ____________________________ Fredia SorrowAbhinav Ronie Barnhart, MD ag:drc D: 02/07/2012 14:58:00 ET T: 02/07/2012 15:24:57 ET JOB#: [id number] cc: Fredia SorrowAbhinav Ailey Wessling, MD, <Dictator>, Cecille PoMaureen P. Alison MurrayAndreassi, MD, Kathreen DevoidKevin L. Krasinski, MD Fredia SorrowABHINAV Dajai Wahlert MD ELECTRONICALLY SIGNED 02/16/2012 11:51

## 2015-02-02 NOTE — H&P (Signed)
PATIENT NAME:  Kerri [name], Kerri [name] MR#:  [id number] DATE OF BIRTH:  [birth year]  DATE OF ADMISSION:  02/01/2012  PRIMARY CARE PHYSICIAN: Dr. Veneda MelterMaureen [name]   CHIEF COMPLAINT: Hypoxia and increased shortness of breath.   HISTORY OF PRESENT ILLNESS: Ms. Kerri [name] is a 81 year old Caucasian female who was just admitted 04/21, discharge today, 04/23, to [facility] for [facility] secondary to her right pelvic fracture. [facility] and they were trying to settle her in, however, she started having some trouble breathing. They did her vitals. Her temperature was 100.2, sats were down in the 80s, put on 5 liters and she was sent back immediately to the Emergency Room. In the Emergency Room patient currently during my evaluation has sats 96% on 2.5 liters. She is currently hemodynamically stable. Has temperature of 98.8, her pulse is 80 and her respirations are 19. She is being admitted for further evaluation on her hypoxemia. Patient underwent CAT scan yesterday which is 04/22 for shortness of breath which was negative for PE. There is a questionable left lower lobe atelectasis versus developing infiltrate. Since yesterday she has been on Levaquin. Patient currently denies any complaints other than pain over the right hip secondary to her pelvic fracture. She denies any productive cough. Denies any chest pain or trouble breathing. She is being admitted for further evaluation and management.   PAST MEDICAL HISTORY:  1. Right-sided pelvic fracture inferior pubic rami on 04/21.  2. Suspected atelectasis versus questionable pneumonia left lower lobe on Levaquin started 01/31/2012.  3. Leukocytosis.  4. History of depression.  5. Hyperlipidemia.  6. Osteopenia.   DISCHARGE MEDICATIONS:  1. Calcium carbonate with vitamin D 1 p.o. b.i.d.  2. Levaquin 500 mg p.o. daily, started on 04/22.  3. Percocet 5/325, 1 to 2 every four hours as needed.  4. Tylenol 650 mg p.o. q.4 as needed.  5. Citalopram 40 mg  p.o. daily.  6. Pravastatin 20 mg at bedtime.  7. Folic acid 1 mg daily.   SOCIAL HISTORY: Patient comes from [facility] today. Nonsmoker, nonalcoholic.   ALLERGIES: No known drug allergies.   FAMILY HISTORY: Positive for hypertension and stroke.   REVIEW OF SYSTEMS: CONSTITUTIONAL: Positive for fatigue, weakness, and right hip pain. EYES: No blurred or double vision. ENT: No tinnitus, ear pain, hearing loss. RESPIRATORY: No cough. Positive for shortness of breath. No wheezing. CARDIOVASCULAR: No chest pain, orthopnea, edema. GASTROINTESTINAL: No nausea, vomiting, diarrhea or abdominal pain. GENITOURINARY: No dysuria, hematuria. ENDOCRINE: No polyuria, nocturia, or thyroid problems. HEMATOLOGY: No anemia or easy bruising. SKIN: No acne, rash. MUSCULOSKELETAL: Positive for arthritis. NEUROLOGIC: No cerebrovascular accident, transient ischemic attack. PSYCH: No anxiety. Positive for depression. All other systems reviewed and negative.   PHYSICAL EXAMINATION:  GENERAL: Patient is awake, alert, oriented x3, not in acute distress.   VITAL SIGNS: Afebrile, pulse 80, blood pressure 144/70, sats 93% to 94% on 2.5 liters, respiratory rate 19.   HEENT: Atraumatic, normocephalic. Pupils are equal, round, and reactive to light and accommodation. Extraocular movements intact. Oral mucosa is moist.   NECK: Supple. No JVD. No carotid bruit.   RESPIRATORY: Decreased breath sounds in the bases. No wheezing or respiratory distress.   CARDIOVASCULAR: Both the heart sounds are normal. No murmur heard. PMI not lateralized.   EXTREMITIES: Good pedal pulses, good femoral pulses. No lower extremity edema.   ABDOMEN: Soft, benign, nontender. No organomegaly. Positive bowel sounds.  NEUROLOGICAL: Grossly intact cranial nerves II through XII. No motor or sensory deficits.  PSYCH: Patient is awake, alert, oriented x3.   LABORATORY, DIAGNOSTIC AND RADIOLOGICAL DATA: Chest x-ray shows left lower lobe atelectasis,  possible left lower lobe infiltrate. Official radiology reading pending. pH 7.41, pCO2 44, pO2 43 with 80% sats on room air. CBC: Hemoglobin and hematocrit 13 and 40.2, white count 11.2, platelet count 117, glucose 121. Rest of the chemistry is within normal limits. B-type natriuretic peptide 609. Urine culture from 04/22 colonies too small to be read.   ASSESSMENT: 81 year old Kerri [name] with:  1. Acute hypoxic respiratory failure, likely due to early infiltrate/pneumonia/left lower lobe versus atelectasis, precipitated likely from poor inspiratory effort due to pelvic pain from the fracture.  2. Right-sided pelvic fracture.  3. Systemic inflammatory response syndrome due to left lower lobe and atelectasis/infiltrate. CT chest 04/22 negative for PE.  4. Mild leukocytosis.  5. Osteopenia.   PLAN:  1. Admit patient to medical floor with off unit telemetry.  2. FULL CODE.  3. Will continue IV Levaquin, give incentive spirometry and DuoNebs around the clock. Continue oxygen, wean as tolerated.  4. Continue all home medications.  5. DVT prophylaxis with TEDs. Patient's platelet count is 117. I am going to hold off on any antiplatelet agent, that is heparin or Lovenox for now.  6. Physical therapy for gait ambulation.  7. Care management for discharge planning.  8. Activity as tolerated. Ambulate with assist.  9. Further work-up according to patient's clinical course. Hospital admission plan was discussed with patient and patient's family members. Patient is a FULL CODE.        TIME SPENT: 50 minutes.   ____________________________ Wylie Hail Allena Katz, MD sap:cms D: 02/01/2012 21:46:40 ET T: 02/02/2012 07:07:11 ET JOB#: [id number]  cc: Kerri [name] A. Allena Katz, MD, <Dictator> Kerri P. Alison Murray, MD Kerri Ora MD ELECTRONICALLY SIGNED 02/07/2012 23:14

## 2015-02-02 NOTE — Consult Note (Signed)
Brief Consult Note: Diagnosis: Right superior and inferior rami fractures.   Patient was seen by consultant.   Recommend further assessment or treatment.   Orders entered.   Comments: Patient seen and examined at the bedside tonight.  She states that she sustained a fall at home, but did not lose consciousness or have syncopal symptoms.  She states she landed on the left side but her pain is over the anterior pelvis, particularly the right side.  I reviewed her medical history from the admission H&P.  On exam, the patient has tenderness over the right pubis extending laterally across the pelvis on the right.  She had to pain with log-rolling of either hip.  She has intact muscle function in all muscle groups of the bilateral lower extremities, intact sensation to light touch and palpable pedal pulses.  The radiographs of the hip and pelvis were reviewed and reveal a comminuted, but minimally displaced, fracture of the superior ramus and a non-displaced fracture of the inferior ramus on the right side.  There is no evidence of hip fractures or left sided pelvic fractures.  I recommend the patient be evaluated by physical therapy in the morning.  She may be partial weight bearing on the right with a walker.  She would benefit from rehab placement.  She may follow-up with me in 4 weeks in the office for re-evaluation.  Electronic Signatures: Juanell FairlyKrasinski, Karla Pavone (MD)  (Signed 22-Apr-13 21:58)  Authored: Brief Consult Note   Last Updated: 22-Apr-13 21:58 by Juanell FairlyKrasinski, Allura Doepke (MD)

## 2016-05-11 ENCOUNTER — Other Ambulatory Visit: Payer: Self-pay | Admitting: Physician Assistant

## 2016-05-11 DIAGNOSIS — S32000A Wedge compression fracture of unspecified lumbar vertebra, initial encounter for closed fracture: Secondary | ICD-10-CM

## 2016-05-17 ENCOUNTER — Ambulatory Visit (HOSPITAL_COMMUNITY)
Admission: RE | Admit: 2016-05-17 | Discharge: 2016-05-17 | Disposition: A | Discharge status: Home / Self Care | Payer: Medicare Other | Source: Ambulatory Visit | Attending: Physician Assistant | Admitting: Physician Assistant

## 2016-05-17 DIAGNOSIS — M4856XA Collapsed vertebra, not elsewhere classified, lumbar region, initial encounter for fracture: Secondary | ICD-10-CM | POA: Diagnosis not present

## 2016-05-17 DIAGNOSIS — S32000A Wedge compression fracture of unspecified lumbar vertebra, initial encounter for closed fracture: Secondary | ICD-10-CM

## 2016-05-17 DIAGNOSIS — M47896 Other spondylosis, lumbar region: Secondary | ICD-10-CM | POA: Insufficient documentation

## 2016-05-25 ENCOUNTER — Ambulatory Visit (HOSPITAL_COMMUNITY)
Admission: RE | Admit: 2016-05-25 | Discharge: 2016-05-25 | Disposition: A | Discharge status: Home / Self Care | Payer: Self-pay | Source: Ambulatory Visit | Attending: Physician Assistant | Admitting: Physician Assistant

## 2016-08-26 DIAGNOSIS — M810 Age-related osteoporosis without current pathological fracture: Secondary | ICD-10-CM | POA: Insufficient documentation

## 2019-04-30 ENCOUNTER — Other Ambulatory Visit: Payer: Self-pay

## 2019-04-30 ENCOUNTER — Emergency Department: Payer: Medicare Other

## 2019-04-30 ENCOUNTER — Inpatient Hospital Stay
Admission: EM | Admit: 2019-04-30 | Discharge: 2019-05-03 | DRG: 872 | Disposition: A | Discharge status: Home Health | Payer: Medicare Other | Attending: Internal Medicine | Admitting: Internal Medicine

## 2019-04-30 DIAGNOSIS — Z20828 Contact with and (suspected) exposure to other viral communicable diseases: Secondary | ICD-10-CM | POA: Diagnosis present

## 2019-04-30 DIAGNOSIS — I129 Hypertensive chronic kidney disease with stage 1 through stage 4 chronic kidney disease, or unspecified chronic kidney disease: Secondary | ICD-10-CM | POA: Diagnosis present

## 2019-04-30 DIAGNOSIS — Z66 Do not resuscitate: Secondary | ICD-10-CM | POA: Diagnosis not present

## 2019-04-30 DIAGNOSIS — K5900 Constipation, unspecified: Secondary | ICD-10-CM | POA: Diagnosis not present

## 2019-04-30 DIAGNOSIS — N183 Chronic kidney disease, stage 3 (moderate): Secondary | ICD-10-CM | POA: Diagnosis present

## 2019-04-30 DIAGNOSIS — F0390 Unspecified dementia without behavioral disturbance: Secondary | ICD-10-CM | POA: Diagnosis present

## 2019-04-30 DIAGNOSIS — Z886 Allergy status to analgesic agent status: Secondary | ICD-10-CM

## 2019-04-30 DIAGNOSIS — Z7951 Long term (current) use of inhaled steroids: Secondary | ICD-10-CM

## 2019-04-30 DIAGNOSIS — Z79899 Other long term (current) drug therapy: Secondary | ICD-10-CM

## 2019-04-30 DIAGNOSIS — A419 Sepsis, unspecified organism: Principal | ICD-10-CM | POA: Diagnosis present

## 2019-04-30 DIAGNOSIS — E039 Hypothyroidism, unspecified: Secondary | ICD-10-CM | POA: Diagnosis present

## 2019-04-30 DIAGNOSIS — R4182 Altered mental status, unspecified: Secondary | ICD-10-CM

## 2019-04-30 DIAGNOSIS — N390 Urinary tract infection, site not specified: Secondary | ICD-10-CM | POA: Diagnosis present

## 2019-04-30 DIAGNOSIS — E785 Hyperlipidemia, unspecified: Secondary | ICD-10-CM | POA: Diagnosis present

## 2019-04-30 DIAGNOSIS — I447 Left bundle-branch block, unspecified: Secondary | ICD-10-CM | POA: Diagnosis present

## 2019-04-30 DIAGNOSIS — Z888 Allergy status to other drugs, medicaments and biological substances status: Secondary | ICD-10-CM

## 2019-04-30 DIAGNOSIS — Z79890 Hormone replacement therapy (postmenopausal): Secondary | ICD-10-CM

## 2019-04-30 DIAGNOSIS — F329 Major depressive disorder, single episode, unspecified: Secondary | ICD-10-CM | POA: Diagnosis present

## 2019-04-30 HISTORY — DX: Unspecified osteoarthritis, unspecified site: M19.90

## 2019-04-30 LAB — COMPREHENSIVE METABOLIC PANEL
ALT: 22 U/L (ref 0–44)
AST: 29 U/L (ref 15–41)
Albumin: 4 g/dL (ref 3.5–5.0)
Alkaline Phosphatase: 60 U/L (ref 38–126)
Anion gap: 11 (ref 5–15)
BUN: 16 mg/dL (ref 8–23)
CO2: 25 mmol/L (ref 22–32)
Calcium: 8.8 mg/dL — ABNORMAL LOW (ref 8.9–10.3)
Chloride: 108 mmol/L (ref 98–111)
Creatinine, Ser: 1.08 mg/dL — ABNORMAL HIGH (ref 0.44–1.00)
GFR calc Af Amer: 54 mL/min — ABNORMAL LOW (ref >60)
GFR calc non Af Amer: 47 mL/min — ABNORMAL LOW (ref >60)
Glucose, Bld: 124 mg/dL — ABNORMAL HIGH (ref 70–99)
Potassium: 3.7 mmol/L (ref 3.5–5.1)
Sodium: 144 mmol/L (ref 135–145)
Total Bilirubin: 1.2 mg/dL (ref 0.3–1.2)
Total Protein: 7.8 g/dL (ref 6.5–8.1)

## 2019-04-30 LAB — CBC WITH DIFFERENTIAL/PLATELET
Abs Immature Granulocytes: 0.06 10*3/uL (ref 0.00–0.07)
Basophils Absolute: 0.1 10*3/uL (ref 0.0–0.1)
Basophils Relative: 0 %
Eosinophils Absolute: 0 10*3/uL (ref 0.0–0.5)
Eosinophils Relative: 0 %
HCT: 49 % — ABNORMAL HIGH (ref 36.0–46.0)
Hemoglobin: 15.8 g/dL — ABNORMAL HIGH (ref 12.0–15.0)
Immature Granulocytes: 0 %
Lymphocytes Relative: 18 %
Lymphs Abs: 2.5 10*3/uL (ref 0.7–4.0)
MCH: 28.8 pg (ref 26.0–34.0)
MCHC: 32.2 g/dL (ref 30.0–36.0)
MCV: 89.4 fL (ref 80.0–100.0)
Monocytes Absolute: 1.1 10*3/uL — ABNORMAL HIGH (ref 0.1–1.0)
Monocytes Relative: 8 %
Neutro Abs: 10.5 10*3/uL — ABNORMAL HIGH (ref 1.7–7.7)
Neutrophils Relative %: 74 %
Platelets: 174 10*3/uL (ref 150–400)
RBC: 5.48 MIL/uL — ABNORMAL HIGH (ref 3.87–5.11)
RDW: 13.4 % (ref 11.5–15.5)
WBC: 14.1 10*3/uL — ABNORMAL HIGH (ref 4.0–10.5)
nRBC: 0 % (ref 0.0–0.2)

## 2019-04-30 LAB — PROTIME-INR
INR: 1.1 (ref 0.8–1.2)
Prothrombin Time: 13.8 seconds (ref 11.4–15.2)

## 2019-04-30 LAB — LACTIC ACID, PLASMA: Lactic Acid, Venous: 2.3 mmol/L (ref 0.5–1.9)

## 2019-04-30 LAB — APTT: aPTT: 31 seconds (ref 24–36)

## 2019-04-30 LAB — SARS CORONAVIRUS 2 BY RT PCR (HOSPITAL ORDER, PERFORMED IN ~~LOC~~ HOSPITAL LAB): SARS Coronavirus 2: NEGATIVE

## 2019-04-30 MED ORDER — VANCOMYCIN HCL 1.5 G IV SOLR
1500.0000 mg | Freq: Once | INTRAVENOUS | Status: AC
  Administered 2019-05-01: 01:00:00 1500 mg via INTRAVENOUS
  Filled 2019-04-30: qty 1500

## 2019-04-30 MED ORDER — SODIUM CHLORIDE 0.9 % IV SOLN
2.0000 g | Freq: Once | INTRAVENOUS | Status: AC
  Administered 2019-04-30: 2 g via INTRAVENOUS
  Filled 2019-04-30: qty 2

## 2019-04-30 MED ORDER — VANCOMYCIN HCL IN DEXTROSE 1-5 GM/200ML-% IV SOLN: 1000.0000 mg | Freq: Once | INTRAVENOUS | Status: DC

## 2019-04-30 MED ORDER — METRONIDAZOLE IN NACL 5-0.79 MG/ML-% IV SOLN
500.0000 mg | Freq: Once | INTRAVENOUS | Status: AC
  Administered 2019-04-30: 500 mg via INTRAVENOUS
  Filled 2019-04-30: qty 100

## 2019-04-30 MED ORDER — SODIUM CHLORIDE 0.9 % IV BOLUS
1000.0000 mL | Freq: Once | INTRAVENOUS | Status: AC
  Administered 2019-04-30: 1000 mL via INTRAVENOUS

## 2019-04-30 NOTE — Progress Notes (Signed)
CODE SEPSIS - PHARMACY COMMUNICATION  **Broad Spectrum Antibiotics should be administered within 1 hour of Sepsis diagnosis**  Time Code Sepsis Called/Page Received: @ 2139  Antibiotics Ordered: Cefepime and Vancomycin   Time of 1st antibiotic administration: @ 2234  Additional action taken by pharmacy:  Messaged MD @ 2200 about patient needing antibiotics orders.  Spoke with RN  @ 2214 about time left to start antibiotic.  If necessary, Name of Provider/Nurse Contacted:  Dr. Clancy Gourd, RN  Pernell Dupre, PharmD, BCPS Clinical Pharmacist 04/30/2019 10:39 PM

## 2019-04-30 NOTE — Consult Note (Signed)
PHARMACY -  BRIEF ANTIBIOTIC NOTE   Pharmacy has received consult(s) for Vancomycin and Cefepime from an ED provider.  The patient's profile has been reviewed for ht/wt/allergies/indication/available labs.    One time order(s) placed for Vancomycin 1500mg  and Cefepime 2g   Pharmacist spoke with RN @ 2213 about vancomycin dose and time left to start antibiotics for Code Sepsis,   Further antibiotics/pharmacy consults should be ordered by admitting physician if indicated.                       Thank you, Pernell Dupre, PharmD, BCPS Clinical Pharmacist 04/30/2019 10:15 PM

## 2019-04-30 NOTE — ED Triage Notes (Signed)
Patient to ER via [facility] from home for c/o AMS. Patient also reported to have fever of 101 at home.

## 2019-04-30 NOTE — ED Provider Notes (Signed)
[facility] Emergency Department Provider Note  ____________________________________________   First MD Initiated Contact with Patient 04/30/19 2113     (approximate)  I have reviewed the triage vital signs and the nursing notes.   HISTORY  Chief Complaint Altered Mental Status    HPI Kerri [name] is a 85 y.o. female otherwise healthy who presents with fevers and weakness.  Patient lives by herself.  She uses a walker at home.  Patient said that she has been feeling weak for the past few days, this is been constant, nothing makes it better, nothing makes it worse..  She been unable to get up.  Therefore she presented today.  There is reported having a fever 101 at home.  She does endorse increased urinary frequency.  Denies abdominal pain, chest pain, shortness of breath.  Patient denies any falls.       Medical: Hypothyroidism.  There are no active problems to display for this patient.    Prior to Admission medications   Medication Sig Start Date End Date Taking? Authorizing Provider  diclofenac sodium (VOLTAREN) 1 % GEL Apply 2 g topically 4 (four) times daily.   Yes [provider]  donepezil (ARICEPT) 5 MG tablet Take 5 mg by mouth at bedtime.   Yes [provider]  fluticasone (FLONASE) 50 MCG/ACT nasal spray Place 2 sprays into both nostrils daily.  02/06/13  Yes [provider]  folic acid (FOLVITE) 1 MG tablet Take 1 mg by mouth daily.   Yes [provider]  levothyroxine (SYNTHROID) 25 MCG tablet TAKE 1 TABLET EVERY DAY EXCEPT Wednesday AND Sunday TAKE 2 TABLETS  TAKE ON EMPTY STOMACH 11/17/18  Yes [provider]  memantine (NAMENDA) 10 MG tablet TAKE ONE TABLET TWICE DAILY 02/19/19  Yes [provider]  naproxen sodium (ANAPROX) 220 MG tablet Take 220 mg by mouth 2 (two) times daily with a meal.   Yes [provider]  omega-3 acid ethyl esters (LOVAZA) 1 g capsule Take 1 g by mouth  daily.  04/30/19  Yes [provider]  raloxifene (EVISTA) 60 MG tablet TAKE ONE TABLET EVERY DAY 06/28/18  Yes [provider]  tolterodine (DETROL LA) 4 MG 24 hr capsule Take 4 mg by mouth daily.  11/20/15 04/25/20 Yes [provider]  venlafaxine XR (EFFEXOR-XR) 75 MG 24 hr capsule Take 75 mg by mouth daily with breakfast.  03/09/19  Yes [provider]  acetaminophen (TYLENOL) 325 MG tablet Take 650 mg by mouth 3 (three) times daily as needed for moderate pain.    [provider]    Allergies Bupropion, Rofecoxib, and Alendronate  No family history on file.  Social History Denies alcohol or drug use.  Review of Systems Constitutional:  positive for fever and weakness. Eyes: No visual changes. ENT: No sore throat. Cardiovascular: Denies chest pain. Respiratory: Denies shortness of breath. Gastrointestinal: No abdominal pain.  No nausea, no vomiting.  No diarrhea.  No constipation. Genitourinary: Positive for increased urination Musculoskeletal: Negative for back pain. Skin: Negative for rash. Neurological: Negative for headaches, focal weakness or numbness. All other ROS negative ____________________________________________   PHYSICAL EXAM:  VITAL SIGNS: Blood pressure (!) 145/66, pulse 91, temperature 99.8 F (37.7 C), temperature source Oral, resp. rate (!) 21, height 5\' 8"  (1.727 m), weight 77 kg, SpO2 94 %.  Constitutional: Well appearing and in no acute distress. Eyes: Conjunctivae are normal. EOMI. Head: Atraumatic. Nose: No congestion/rhinnorhea. Mouth/Throat: Mucous membranes are moist.  Neck: No stridor. Trachea Midline. FROM Cardiovascular: Normal rate, regular rhythm. Grossly normal heart sounds.  Good peripheral circulation. Respiratory: Normal respiratory effort.  No retractions. Lungs CTAB. Gastrointestinal: Soft and nontender. No distention. No abdominal bruits.  Musculoskeletal: No lower extremity tenderness nor  edema.  No joint effusions. Neurologic:  Normal speech and language. No gross focal neurologic deficits are appreciated.  Does not know the year. Skin:  Skin is warm, dry and intact. No rash noted. Psychiatric: Mood and affect are normal. Speech and behavior are normal. GU: Deferred   ____________________________________________   LABS (all labs ordered are listed, but only abnormal results are displayed)  Labs Reviewed  COMPREHENSIVE METABOLIC PANEL - Abnormal; Notable for the following components:      Result Value   Glucose, Bld 124 (*)    Creatinine, Ser 1.08 (*)    Calcium 8.8 (*)    GFR calc non Af Amer 47 (*)    GFR calc Af Amer 54 (*)    All other components within normal limits  CBC WITH DIFFERENTIAL/PLATELET - Abnormal; Notable for the following components:   WBC 14.1 (*)    RBC 5.48 (*)    Hemoglobin 15.8 (*)    HCT 49.0 (*)    Neutro Abs 10.5 (*)    Monocytes Absolute 1.1 (*)    All other components within normal limits  LACTIC ACID, PLASMA - Abnormal; Notable for the following components:   Lactic Acid, Venous 2.3 (*)    All other components within normal limits  CULTURE, BLOOD (ROUTINE X 2)  CULTURE, BLOOD (ROUTINE X 2)  URINE CULTURE  SARS CORONAVIRUS 2 (HOSPITAL ORDER, PERFORMED IN [facility])  APTT  PROTIME-INR  LACTIC ACID, PLASMA  URINALYSIS, ROUTINE W REFLEX MICROSCOPIC   ____________________________________________   ED ECG REPORT I, Concha SeMary E Gladine Plude, the attending physician, personally viewed and interpreted this ECG.  EKG left bundle branch block, negative for scar Bosa criteria, sinus rate of 96 ____________________________________________  RADIOLOGY Vela ProseI, Retha Bither E Tadan Shill, personally viewed and evaluated these images (plain radiographs) as part of my medical decision making, as well as reviewing the written report by the radiologist.  ED MD interpretation: Chest x-ray with no evidence of pneumonia.  Official radiology report(s): Dg  Chest Port 1 View  Result Date: 04/30/2019 CLINICAL DATA:  Fever EXAM: PORTABLE CHEST 1 VIEW COMPARISON:  None. FINDINGS: There is a leftchest wall pacemakerwith leads projecting within the right atrium and right ventricle. The heart size and mediastinal contours are within normal limits. Both lungs are clear. The visualized skeletal structures are unremarkable. IMPRESSION: No active disease. Electronically Signed   By: Deatra RobinsonKevin  Herman M.D.   On: 04/30/2019 22:25    ____________________________________________   PROCEDURES  Procedure(s) performed (including Critical Care):  .Critical Care Performed by: Concha SeFunke, Dezyrae Kensinger E, MD Authorized by: Concha SeFunke, Shanequia Kendrick E, MD   Critical care provider statement:    Critical care time (minutes):  30   Critical care was necessary to treat or prevent imminent or life-threatening deterioration of the following conditions:  Sepsis   Critical care was time spent personally by me on the following activities:  Discussions with consultants, evaluation of patient's response to treatment, examination of patient, ordering and performing treatments and interventions, ordering and review of laboratory studies, ordering and review of radiographic studies, pulse oximetry, re-evaluation of patient's condition, obtaining history from patient or surrogate and review of old charts     ____________________________________________   INITIAL IMPRESSION / ASSESSMENT AND PLAN /  ED COURSE  Kerri [name] was evaluated in Emergency Department on 04/30/2019 for the symptoms described in the history of present illness. She was evaluated in the context of the global COVID-19 pandemic, which necessitated consideration that the patient might be at risk for infection with the SARS-CoV-2 virus that causes COVID-19. Institutional protocols and algorithms that pertain to the evaluation of patients at risk for COVID-19 are in a state of rapid change based on information released by regulatory bodies  including the CDC and federal and state organizations. These policies and algorithms were followed during the patient's care in the ED.     Given patient's tachycardia, fever and I suspect patient will have high white count sepsis alert called.  Patient started broad-spectrum antibiotics.  Will get chest x-ray to evaluate for pneumonia.  Urine evaluate for UTI and coronavirus testing.  Patient has no abdominal pain distress abdominal infection.  Lactate slightly low elevated.  White count elevated 4281 consistent with my concern for infection.  Patient handed off to oncoming team pending urinalysis.  I suspect patient will need to have been admitted for her weakness and infection.       ____________________________________________   FINAL CLINICAL IMPRESSION(S) / ED DIAGNOSES   Final diagnoses:  Sepsis, due to unspecified organism, unspecified whether acute organ dysfunction present ([place])      MEDICATIONS GIVEN DURING THIS VISIT:  Medications  ceFEPIme (MAXIPIME) 2 g in sodium chloride 0.9 % 100 mL IVPB (2 g Intravenous New Bag/Given 04/30/19 2234)  metroNIDAZOLE (FLAGYL) IVPB 500 mg (has no administration in time range)  vancomycin (VANCOCIN) 1,500 mg in sodium chloride 0.9 % 500 mL IVPB (has no administration in time range)  sodium chloride 0.9 % bolus 1,000 mL (1,000 mLs Intravenous New Bag/Given 04/30/19 2235)     ED Discharge Orders    None       Note:  This document was prepared using Dragon voice recognition software and may include unintentional dictation errors.   Vanessa Seward, MD 04/30/19 [phone]

## 2019-05-01 ENCOUNTER — Other Ambulatory Visit: Payer: Self-pay

## 2019-05-01 DIAGNOSIS — Z20828 Contact with and (suspected) exposure to other viral communicable diseases: Secondary | ICD-10-CM | POA: Diagnosis present

## 2019-05-01 DIAGNOSIS — K5900 Constipation, unspecified: Secondary | ICD-10-CM | POA: Diagnosis not present

## 2019-05-01 DIAGNOSIS — Z886 Allergy status to analgesic agent status: Secondary | ICD-10-CM | POA: Diagnosis not present

## 2019-05-01 DIAGNOSIS — I129 Hypertensive chronic kidney disease with stage 1 through stage 4 chronic kidney disease, or unspecified chronic kidney disease: Secondary | ICD-10-CM | POA: Diagnosis present

## 2019-05-01 DIAGNOSIS — F0390 Unspecified dementia without behavioral disturbance: Secondary | ICD-10-CM | POA: Diagnosis present

## 2019-05-01 DIAGNOSIS — Z888 Allergy status to other drugs, medicaments and biological substances status: Secondary | ICD-10-CM | POA: Diagnosis not present

## 2019-05-01 DIAGNOSIS — N390 Urinary tract infection, site not specified: Secondary | ICD-10-CM | POA: Diagnosis present

## 2019-05-01 DIAGNOSIS — E785 Hyperlipidemia, unspecified: Secondary | ICD-10-CM | POA: Diagnosis present

## 2019-05-01 DIAGNOSIS — Z66 Do not resuscitate: Secondary | ICD-10-CM | POA: Diagnosis not present

## 2019-05-01 DIAGNOSIS — Z79899 Other long term (current) drug therapy: Secondary | ICD-10-CM | POA: Diagnosis not present

## 2019-05-01 DIAGNOSIS — A419 Sepsis, unspecified organism: Secondary | ICD-10-CM | POA: Diagnosis present

## 2019-05-01 DIAGNOSIS — Z79890 Hormone replacement therapy (postmenopausal): Secondary | ICD-10-CM | POA: Diagnosis not present

## 2019-05-01 DIAGNOSIS — N183 Chronic kidney disease, stage 3 (moderate): Secondary | ICD-10-CM | POA: Diagnosis present

## 2019-05-01 DIAGNOSIS — R4182 Altered mental status, unspecified: Secondary | ICD-10-CM | POA: Diagnosis present

## 2019-05-01 DIAGNOSIS — F329 Major depressive disorder, single episode, unspecified: Secondary | ICD-10-CM | POA: Diagnosis present

## 2019-05-01 DIAGNOSIS — I447 Left bundle-branch block, unspecified: Secondary | ICD-10-CM | POA: Diagnosis present

## 2019-05-01 DIAGNOSIS — Z7951 Long term (current) use of inhaled steroids: Secondary | ICD-10-CM | POA: Diagnosis not present

## 2019-05-01 DIAGNOSIS — E039 Hypothyroidism, unspecified: Secondary | ICD-10-CM | POA: Diagnosis present

## 2019-05-01 LAB — CBC
HCT: 44.6 % (ref 36.0–46.0)
Hemoglobin: 14.4 g/dL (ref 12.0–15.0)
MCH: 28.9 pg (ref 26.0–34.0)
MCHC: 32.3 g/dL (ref 30.0–36.0)
MCV: 89.6 fL (ref 80.0–100.0)
Platelets: 163 10*3/uL (ref 150–400)
RBC: 4.98 MIL/uL (ref 3.87–5.11)
RDW: 13.4 % (ref 11.5–15.5)
WBC: 11.1 10*3/uL — ABNORMAL HIGH (ref 4.0–10.5)
nRBC: 0 % (ref 0.0–0.2)

## 2019-05-01 LAB — URINALYSIS, ROUTINE W REFLEX MICROSCOPIC
Bilirubin Urine: NEGATIVE
Glucose, UA: NEGATIVE mg/dL
Ketones, ur: 5 mg/dL — AB
Nitrite: NEGATIVE
Protein, ur: 30 mg/dL — AB
Specific Gravity, Urine: 1.015 (ref 1.005–1.030)
WBC, UA: >50 WBC/hpf — ABNORMAL HIGH (ref 0–5)
pH: 6 (ref 5.0–8.0)

## 2019-05-01 LAB — BASIC METABOLIC PANEL
Anion gap: 9 (ref 5–15)
BUN: 16 mg/dL (ref 8–23)
CO2: 24 mmol/L (ref 22–32)
Calcium: 7.8 mg/dL — ABNORMAL LOW (ref 8.9–10.3)
Chloride: 112 mmol/L — ABNORMAL HIGH (ref 98–111)
Creatinine, Ser: 1.11 mg/dL — ABNORMAL HIGH (ref 0.44–1.00)
GFR calc Af Amer: 52 mL/min — ABNORMAL LOW (ref >60)
GFR calc non Af Amer: 45 mL/min — ABNORMAL LOW (ref >60)
Glucose, Bld: 124 mg/dL — ABNORMAL HIGH (ref 70–99)
Potassium: 3.9 mmol/L (ref 3.5–5.1)
Sodium: 145 mmol/L (ref 135–145)

## 2019-05-01 LAB — LACTIC ACID, PLASMA
Lactic Acid, Venous: 2.1 mmol/L (ref 0.5–1.9)
Lactic Acid, Venous: 1.4 mmol/L (ref 0.5–1.9)

## 2019-05-01 LAB — PROTIME-INR
INR: 1.1 (ref 0.8–1.2)
Prothrombin Time: 14.4 seconds (ref 11.4–15.2)

## 2019-05-01 LAB — TSH: TSH: 2.163 u[IU]/mL (ref 0.350–4.500)

## 2019-05-01 LAB — CORTISOL-AM, BLOOD: Cortisol - AM: 19.7 ug/dL (ref 6.7–22.6)

## 2019-05-01 LAB — PROCALCITONIN: Procalcitonin: <0.1 ng/mL

## 2019-05-01 MED ORDER — FLUTICASONE PROPIONATE 50 MCG/ACT NA SUSP
2.0000 | Freq: Every day | NASAL | Status: DC
  Administered 2019-05-01 – 2019-05-03 (×3): 2 via NASAL
  Filled 2019-05-01: qty 16

## 2019-05-01 MED ORDER — ENOXAPARIN SODIUM 40 MG/0.4ML ~~LOC~~ SOLN
40.0000 mg | SUBCUTANEOUS | Status: DC
  Administered 2019-05-01 – 2019-05-03 (×3): 40 mg via SUBCUTANEOUS
  Filled 2019-05-01 (×3): qty 0.4

## 2019-05-01 MED ORDER — SODIUM CHLORIDE 0.9 % IV SOLN
INTRAVENOUS | Status: DC
  Administered 2019-05-01 – 2019-05-03 (×5): via INTRAVENOUS

## 2019-05-01 MED ORDER — FESOTERODINE FUMARATE ER 4 MG PO TB24
4.0000 mg | ORAL_TABLET | Freq: Every day | ORAL | Status: DC
  Administered 2019-05-01 – 2019-05-03 (×3): 4 mg via ORAL
  Filled 2019-05-01 (×3): qty 1

## 2019-05-01 MED ORDER — DONEPEZIL HCL 5 MG PO TABS
5.0000 mg | ORAL_TABLET | Freq: Every day | ORAL | Status: DC
  Administered 2019-05-01 – 2019-05-02 (×2): 5 mg via ORAL
  Filled 2019-05-01 (×2): qty 1

## 2019-05-01 MED ORDER — LEVOTHYROXINE SODIUM 50 MCG PO TABS
25.0000 ug | ORAL_TABLET | Freq: Every day | ORAL | Status: DC
  Administered 2019-05-01 – 2019-05-03 (×3): 25 ug via ORAL
  Filled 2019-05-01 (×3): qty 1

## 2019-05-01 MED ORDER — SODIUM CHLORIDE 0.9 % IV SOLN
INTRAVENOUS | Status: DC | PRN
  Administered 2019-05-01: 21:00:00 250 mL via INTRAVENOUS

## 2019-05-01 MED ORDER — ACETAMINOPHEN 650 MG RE SUPP: 650.0000 mg | Freq: Four times a day (QID) | RECTAL | Status: DC | PRN

## 2019-05-01 MED ORDER — SODIUM CHLORIDE 0.9 % IV SOLN
2.0000 g | Freq: Two times a day (BID) | INTRAVENOUS | Status: DC
  Administered 2019-05-01 – 2019-05-03 (×5): 2 g via INTRAVENOUS
  Filled 2019-05-01 (×7): qty 2

## 2019-05-01 MED ORDER — ONDANSETRON HCL 4 MG PO TABS: 4.0000 mg | ORAL_TABLET | Freq: Four times a day (QID) | ORAL | Status: DC | PRN

## 2019-05-01 MED ORDER — SODIUM CHLORIDE 0.9 % IV SOLN: 1.0000 g | INTRAVENOUS | Status: DC

## 2019-05-01 MED ORDER — ONDANSETRON HCL 4 MG/2ML IJ SOLN: 4.0000 mg | Freq: Four times a day (QID) | INTRAMUSCULAR | Status: DC | PRN

## 2019-05-01 MED ORDER — VANCOMYCIN HCL IN DEXTROSE 1-5 GM/200ML-% IV SOLN
1000.0000 mg | INTRAVENOUS | Status: DC
  Administered 2019-05-01: 21:00:00 1000 mg via INTRAVENOUS
  Filled 2019-05-01 (×2): qty 200

## 2019-05-01 MED ORDER — MEMANTINE HCL 5 MG PO TABS
10.0000 mg | ORAL_TABLET | Freq: Two times a day (BID) | ORAL | Status: DC
  Administered 2019-05-01 – 2019-05-03 (×5): 10 mg via ORAL
  Filled 2019-05-01 (×5): qty 2

## 2019-05-01 MED ORDER — VENLAFAXINE HCL ER 75 MG PO CP24
75.0000 mg | ORAL_CAPSULE | Freq: Every day | ORAL | Status: DC
  Administered 2019-05-01 – 2019-05-03 (×3): 75 mg via ORAL
  Filled 2019-05-01 (×3): qty 1

## 2019-05-01 MED ORDER — FOLIC ACID 1 MG PO TABS
1.0000 mg | ORAL_TABLET | Freq: Every day | ORAL | Status: DC
  Administered 2019-05-01 – 2019-05-03 (×3): 1 mg via ORAL
  Filled 2019-05-01 (×3): qty 1

## 2019-05-01 MED ORDER — ACETAMINOPHEN 325 MG PO TABS
650.0000 mg | ORAL_TABLET | Freq: Four times a day (QID) | ORAL | Status: DC | PRN
  Administered 2019-05-01: 20:00:00 650 mg via ORAL
  Filled 2019-05-01: qty 2

## 2019-05-01 MED ORDER — RALOXIFENE HCL 60 MG PO TABS
60.0000 mg | ORAL_TABLET | Freq: Every day | ORAL | Status: DC
  Administered 2019-05-01 – 2019-05-03 (×3): 60 mg via ORAL
  Filled 2019-05-01 (×3): qty 1

## 2019-05-01 NOTE — ED Notes (Signed)
Date and time results received: 05/01/19 0220   Test: Lactic Critical Value: 2.1  Name of Provider Notified:  Gardiner Barefoot  Orders Received? Or Actions Taken?: Acknowledged.

## 2019-05-01 NOTE — Progress Notes (Signed)
[employer] - [place] [facility]   PATIENT NAME: Kerri [name]    MR#:  [id number]  DATE OF BIRTH:  June 12, 1931  SUBJECTIVE:  CHIEF COMPLAINT:   Chief Complaint  Patient presents with  . Altered Mental Status   No new complaint this morning.  Patient awake and alert and oriented this morning.  No fevers. REVIEW OF SYSTEMS:  Review of Systems  Constitutional: Negative for chills and fever.  HENT: Negative for hearing loss and tinnitus.   Eyes: Negative for blurred vision and double vision.  Respiratory: Negative for cough and hemoptysis.   Cardiovascular: Negative for chest pain and palpitations.  Gastrointestinal: Negative for heartburn and nausea.  Genitourinary: Negative for dysuria and urgency.  Musculoskeletal: Negative for myalgias and neck pain.  Skin: Negative for itching and rash.  Neurological: Negative for dizziness and headaches.  Psychiatric/Behavioral: Negative for depression and hallucinations.    DRUG ALLERGIES:   Allergies  Allergen Reactions  . Bupropion Other (See Comments)    Tremors  Tremors    . Rofecoxib Diarrhea  . Alendronate Rash   VITALS:  Blood pressure (!) 141/69, pulse 75, temperature 98.3 F (36.8 C), temperature source Oral, resp. rate 20, height 5\' 8"  (1.727 m), weight 77 kg, SpO2 94 %. PHYSICAL EXAMINATION:  Physical Exam  GENERAL:  85 y.o.-year-old patient lying in the bed with no acute distress.  EYES: Pupils equal, round, reactive to light and accommodation. No scleral icterus. Extraocular muscles intact.  HEENT: Head atraumatic, normocephalic. Oropharynx and nasopharynx clear.  NECK:  Supple, no jugular venous distention. No thyroid enlargement, no tenderness.  LUNGS: Normal breath sounds bilaterally, no wheezing, rales,rhonchi or crepitation. No use of accessory muscles of respiration.  CARDIOVASCULAR: Regular rate and rhythm, S1, S2 normal. No murmurs, rubs, or gallops.  ABDOMEN: Soft, nondistended, Bowel  sounds present. No organomegaly or mass.  EXTREMITIES: No pedal edema, cyanosis, or clubbing.  NEUROLOGIC: Cranial nerves II through XII are intact. Muscle strength 5/5 in all extremities. Sensation intact. Gait not checked.  PSYCHIATRIC: The patient is alert and oriented x person and place only.  Normal affect and good eye contact. SKIN: No obvious rash, lesion, or ulcer.   LABORATORY PANEL:  Female CBC Recent Labs  Lab 05/01/19 0505  WBC 11.1*  HGB 14.4  HCT 44.6  PLT 163   ------------------------------------------------------------------------------------------------------------------ Chemistries  Recent Labs  Lab 04/30/19 2144 05/01/19 0505  NA 144 145  K 3.7 3.9  CL 108 112*  CO2 25 24  GLUCOSE 124* 124*  BUN 16 16  CREATININE 1.08* 1.11*  CALCIUM 8.8* 7.8*  AST 29  --   ALT 22  --   ALKPHOS 60  --   BILITOT 1.2  --    RADIOLOGY:  Dg Chest Port 1 View  Result Date: 04/30/2019 CLINICAL DATA:  Fever EXAM: PORTABLE CHEST 1 VIEW COMPARISON:  None. FINDINGS: There is a leftchest wall pacemakerwith leads projecting within the right atrium and right ventricle. The heart size and mediastinal contours are within normal limits. Both lungs are clear. The visualized skeletal structures are unremarkable. IMPRESSION: No active disease. Electronically Signed   By: Deatra RobinsonKevin  Herman M.D.   On: 04/30/2019 22:25   ASSESSMENT AND PLAN:    1.  Sepsis secondary to UTI - Patient received 2 L normal saline bolus currently with normal saline infusing to a peripheral IV at 100 cc/h - Broad-spectrum antibiotic therapy has been initiated in the emergency room and continued with cefepime and vancomycin.  Follow-up on culture and sensitivities.  Lactic acid level down to normal.  2.  Urinary tract infection -Urine culture pending - Cefepime and IV vancomycin initiated.  We will initiate de-escalation of antibiotics in a.m. if patient continues to improve clinically in a.m.  3. hypothyroidism  - Continue Synthroid  4.  Dementia -Social service consulted for evaluation of patient's living arrangement for safety.  Case manager is already following. -Patient awake and alert and oriented this morning. - Aricept continued  DVT prophylaxis ; Lovenox    All the records are reviewed and case discussed with Care Management/Social Worker. Management plans discussed with the patient, family and they are in agreement. I called and updated patient's son Mr. Juanda Crumble.  He is in agreement with the plans of care as outlined.  He agrees with patient's decision to be DNR/DNI going forward.  Also called patient's daughter with no response.  CODE STATUS: DNR/DNI  TOTAL TIME TAKING CARE OF THIS PATIENT: 36 minutes.   More than 50% of the time was spent in counseling/coordination of care: YES  POSSIBLE D/C IN 2 DAYS, DEPENDING ON CLINICAL CONDITION.   Caprisha Bridgett M.D on 05/01/2019 at 1:24 PM  Between 7am to 6pm - Pager - [phone]  After 6pm go to [email or web address] - [employer] [facility]  Office  [phone]  CC: Primary care physician; Whitaker, Rolanda Jay, PA-C  Note: This dictation was prepared with Dragon dictation along with smaller phrase technology. Any transcriptional errors that result from this process are unintentional.

## 2019-05-01 NOTE — H&P (Signed)
[facility] [place] [facility]   PATIENT NAME: Kerri KnudsenBetty Zeisler    MR#:  [id number]  DATE OF BIRTH:  24-Feb-1931  DATE OF ADMISSION:  04/30/2019  PRIMARY CARE PHYSICIAN: Wilford CornerWhitaker, Jason Hestle, PA-C   REQUESTING/REFERRING PHYSICIAN:Mary Fuller PlanFunke, MD  CHIEF COMPLAINT:   Chief Complaint  Patient presents with  . Altered Mental Status    HISTORY OF PRESENT ILLNESS:  Kerri [name]  is a 85 y.o. female with a known history of cardiac arrhythmia, dementia, depression, UTI, hyperlipidemia, hypertension, hypothyroidism, CKD stage III.  According to patient and her daughter, patient lives at home by herself.  She usually uses a walker for ambulatory assistance.  Patient reports she has been weak over the last 2 to 3 days unable to perform her ADLs as usual.  She has noted fevers and chills as well as mild dysuria and lower abdominal pain with a foul urine odor.  She also notes urinary frequency.  She denies recent falls.  She denies chest pain or shortness of breath.  She denies nausea, vomiting, or diarrhea.  Labs on arrival to the emergency room with elevated lactic acid of 2.1 and WBC of 14.1.  Rapid COVID-19 testing is negative.  Urinalysis demonstrates large amount of leukocytes, positive bacteria, and greater than 50 WBCs.  Urine and blood cultures are pending.  We have admitted her to the hospital service for further management.  PAST MEDICAL HISTORY:   Past Medical History:  Diagnosis Date  . Arthritis   Cardiac arrhythmia status post pacemaker insertion and removal Dementia Depression UTI Hyperlipidemia Hypertension Hypothyroidism CKD stage III  PAST SURGICAL HISTORY:  Pacemaker placement Appendectomy Cataract extraction Skin cancer removal Bladder tack Colonoscopy Pacemaker movement Hysterectomy Colonoscopy  SOCIAL HISTORY:   Social History   Tobacco Use  . Smoking status: Not on file  Substance Use Topics  . Alcohol use: Never   Frequency: Never    FAMILY HISTORY:  No family history on file.  DRUG ALLERGIES:   Allergies  Allergen Reactions  . Bupropion Other (See Comments)    Tremors  Tremors    . Rofecoxib Diarrhea  . Alendronate Rash    REVIEW OF SYSTEMS:   Review of Systems  Constitutional: Positive for chills, fever and malaise/fatigue.  HENT: Negative for congestion and sore throat.   Eyes: Negative for blurred vision and double vision.  Respiratory: Negative for cough, sputum production, shortness of breath and wheezing.   Cardiovascular: Negative for chest pain and palpitations.  Gastrointestinal: Positive for abdominal pain and nausea. Negative for constipation, diarrhea, heartburn and vomiting.  Genitourinary: Positive for dysuria and frequency. Negative for flank pain and hematuria.  Musculoskeletal: Negative for falls, joint pain and myalgias.  Skin: Negative for itching and rash.  Neurological: Positive for weakness. Negative for dizziness and headaches.  Psychiatric/Behavioral: Negative.  Negative for depression.     MEDICATIONS AT HOME:   Prior to Admission medications   Medication Sig Start Date End Date Taking? Authorizing Provider  diclofenac sodium (VOLTAREN) 1 % GEL Apply 2 g topically 4 (four) times daily.   Yes [provider]  donepezil (ARICEPT) 5 MG tablet Take 5 mg by mouth at bedtime.   Yes [provider]  fluticasone (FLONASE) 50 MCG/ACT nasal spray Place 2 sprays into both nostrils daily.  02/06/13  Yes [provider]  folic acid (FOLVITE) 1 MG tablet Take 1 mg by mouth daily.   Yes [provider]  levothyroxine (SYNTHROID) 25 MCG tablet TAKE  1 TABLET EVERY DAY EXCEPT Wednesday AND Sunday TAKE 2 TABLETS  TAKE ON EMPTY STOMACH 11/17/18  Yes [provider]  memantine (NAMENDA) 10 MG tablet TAKE ONE TABLET TWICE DAILY 02/19/19  Yes [provider]  naproxen sodium (ANAPROX) 220 MG tablet Take 220 mg by mouth 2 (two)  times daily with a meal.   Yes [provider]  omega-3 acid ethyl esters (LOVAZA) 1 g capsule Take 1 g by mouth daily.  04/30/19  Yes [provider]  raloxifene (EVISTA) 60 MG tablet TAKE ONE TABLET EVERY DAY 06/28/18  Yes [provider]  tolterodine (DETROL LA) 4 MG 24 hr capsule Take 4 mg by mouth daily.  11/20/15 04/25/20 Yes [provider]  venlafaxine XR (EFFEXOR-XR) 75 MG 24 hr capsule Take 75 mg by mouth daily with breakfast.  03/09/19  Yes [provider]  acetaminophen (TYLENOL) 325 MG tablet Take 650 mg by mouth 3 (three) times daily as needed for moderate pain.    [provider]      VITAL SIGNS:  Blood pressure (!) 141/69, pulse 75, temperature 98.3 F (36.8 C), temperature source Oral, resp. rate 20, height 5\' 8"  (1.727 m), weight 77 kg, SpO2 94 %.  PHYSICAL EXAMINATION:  Physical Exam  GENERAL:  85 y.o.-year-old patient lying in the bed with no acute distress.  EYES: Pupils equal, round, reactive to light and accommodation. No scleral icterus. Extraocular muscles intact.  HEENT: Head atraumatic, normocephalic. Oropharynx and nasopharynx clear.  NECK:  Supple, no jugular venous distention. No thyroid enlargement, no tenderness.  LUNGS: Normal breath sounds bilaterally, no wheezing, rales,rhonchi or crepitation. No use of accessory muscles of respiration.  CARDIOVASCULAR: Regular rate and rhythm, S1, S2 normal. No murmurs, rubs, or gallops.  ABDOMEN: Soft, nondistended, Low abdominal tenderness. Bowel sounds present. No organomegaly or mass.  EXTREMITIES: No pedal edema, cyanosis, or clubbing.  NEUROLOGIC: Cranial nerves II through XII are intact. Muscle strength 5/5 in all extremities. Sensation intact. Gait not checked.  PSYCHIATRIC: The patient is alert and oriented x person and place only.  Normal affect and good eye contact. SKIN: No obvious rash, lesion, or ulcer.   LABORATORY PANEL:   CBC Recent Labs  Lab 04/30/19  2144  WBC 14.1*  HGB 15.8*  HCT 49.0*  PLT 174   ------------------------------------------------------------------------------------------------------------------  Chemistries  Recent Labs  Lab 04/30/19 2144  NA 144  K 3.7  CL 108  CO2 25  GLUCOSE 124*  BUN 16  CREATININE 1.08*  CALCIUM 8.8*  AST 29  ALT 22  ALKPHOS 60  BILITOT 1.2   ------------------------------------------------------------------------------------------------------------------  Cardiac Enzymes No results for input(s): TROPONINI in the last 168 hours. ------------------------------------------------------------------------------------------------------------------  RADIOLOGY:  Dg Chest Port 1 View  Result Date: 04/30/2019 CLINICAL DATA:  Fever EXAM: PORTABLE CHEST 1 VIEW COMPARISON:  None. FINDINGS: There is a leftchest wall pacemakerwith leads projecting within the right atrium and right ventricle. The heart size and mediastinal contours are within normal limits. Both lungs are clear. The visualized skeletal structures are unremarkable. IMPRESSION: No active disease. Electronically Signed   By: Ulyses Jarred M.D.   On: 04/30/2019 22:25      IMPRESSION AND PLAN:   1.  Sepsis - Patient received 2 L normal saline bolus currently with normal saline infusing to a peripheral IV at 100 cc/h - Broad-spectrum antibiotic therapy has been initiated in the emergency room and continued with cefepime and vancomycin - Will repeat lactic acid per protocol - We will  treat fever with Tylenol -Blood and urine cultures are pending  2.  Urinary tract infection -Urine culture pending - Cefepime and IV vancomycin initiated -Will adjust treatment based on culture results  3. hypothyroidism - Continue Synthroid  4.  Dementia -Social service consulted for evaluation of patient's living arrangement for safety - Aricept continued  DVT and PPI prophylaxis initiated    All the records are reviewed and case  discussed with ED provider. The plan of care was discussed in details with the patient (and family). I answered all questions. The patient agreed to proceed with the above mentioned plan. Further management will depend upon hospital course.   CODE STATUS: Full code  TOTAL TIME TAKING CARE OF THIS PATIENT:45 minutes.    Milas Kocherngela H Mir Fullilove CRNP on 05/01/2019 at 4:44 AM  Pager - [phone]  After 6pm go to [email or web address] - Scientist, research (life sciences)password EPAS [email or web address]  [facility]  Office  [phone]  CC: Primary care physician; Whitaker, Jonnie FinnerJason Hestle, PA-C   Note: This dictation was prepared with Dragon dictation along with smaller phrase technology. Any transcriptional errors that result from this process are unintentional.

## 2019-05-01 NOTE — Progress Notes (Signed)
Code status changed to DNR by Dr. Stark Jock.  Purple DNR bracelet placed on patient.  Clarise Cruz, BSN

## 2019-05-01 NOTE — Progress Notes (Addendum)
   05/01/19 1000  Clinical Encounter Type  Visited With Patient  Visit Type Initial  Referral From Nurse   Chaplain received an OR to complete or update an AD. After talking with the patient's nurse it was confirmed that it is not appropriate to attempt complete the AD at this time due to the patient's status.   Upon arrival, the patient was sitting up in bed eating breakfast. She was alert and oriented. The patient stated that she is feeling better than when she was admitted. She inquired about her eyeglasses and asked if this writer would be willing to call her son to inquire about them. This Probation officer will follow-up with the patient's son, Kerri [name] as listed in her contacts. Kerri [name] will go to the patient's (his mother's) home to look for her eyeglasses and be in touch with his mother via phone.

## 2019-05-01 NOTE — Progress Notes (Addendum)
After talking with the patient's son, Kerri [name], the chaplain relayed the message the message about locating her eyeglasses. She expressed gratitude for the follow-up and remarked that, "I bet my children are tired of me." Chaplain provided compassionate presence and active and reflective listening as the patient talked about her emotional state and shared some of her familial history. The patient also shared that she feels she "is just old and needs to die." Chaplain provided encouraging words and empathy for the patient.   Follow-up: Continue listening and engaging the patient in sharing her feelings about life, what is next, and her desire to die.

## 2019-05-01 NOTE — Consult Note (Signed)
Pharmacy Antibiotic Note  Kerri [name] is a 85 y.o. female admitted on 04/30/2019 with sepsis.  Pharmacy has been consulted for Cefepime and vancomycin dosing.  Plan: Vancomycin 1500mg  IV x 1 dose. Start Vancomycin 1000 mg IV Q 24 hrs. Goal AUC 400-550. Expected AUC: 497 SCr used: 1.08  Start Cefepime 2g IV every 12 hours    Height: 5\' 8"  (172.7 cm) Weight: 169 lb 12.8 oz (77 kg) IBW/kg (Calculated) : 63.9  Temp (24hrs), Avg:99.3 F (37.4 C), Min:98.7 F (37.1 C), Max:99.8 F (37.7 C)  Recent Labs  Lab 04/30/19 2144 04/30/19 2219 05/01/19 0134  WBC 14.1*  --   --   CREATININE 1.08*  --   --   LATICACIDVEN  --  2.3* 2.1*    Estimated Creatinine Clearance: 41.5 mL/min (A) (by C-G formula based on SCr of 1.08 mg/dL (H)).    Allergies  Allergen Reactions  . Bupropion Other (See Comments)    Tremors  Tremors    . Rofecoxib Diarrhea  . Alendronate Rash    Antimicrobials this admission: 7/20 vancomycin  >>  7/20 Cefepime >>   Microbiology results: 7/20 BCx: pending 7/20 UCx: pending  Thank you for allowing pharmacy to be a part of this patient's care.  Pernell Dupre, PharmD, BCPS Clinical Pharmacist 05/01/2019 3:42 AM

## 2019-05-01 NOTE — ED Notes (Signed)
ED TO INPATIENT HANDOFF REPORT  ED Nurse Name and Phone #: Harolyn RutherfordJennifer I, [phone]  S Name/Age/Gender Kerri [name] 85 y.o. female Room/Bed: ED02A/ED02A  Code Status   Code Status: Full Code  Home/SNF/Other Home Patient oriented to: self, place and situation Is this baseline? Yes   Triage Complete: Triage complete  Chief Complaint alt mental status ems  Triage Note Patient to ER via [facility] from home for c/o AMS. Patient also reported to have fever of 101 at home.    Allergies Allergies  Allergen Reactions  . Bupropion Other (See Comments)    Tremors  Tremors    . Rofecoxib Diarrhea  . Alendronate Rash    Level of Care/Admitting Diagnosis ED Disposition    ED Disposition Condition Comment   Admit  [facility] [[id number]]  Level of Care: Med-Surg [16]  Covid Evaluation: Asymptomatic Screening Protocol (No Symptoms)  Diagnosis: Sepsis [facility](HCC) [[id number]]  Admitting Physician: Pearletha AlfredSEALS, ANGELA H [[id number]]  Attending Physician: Pearletha AlfredSEALS, ANGELA H [[id number]]  Estimated length of stay: past midnight tomorrow  Certification:: I certify this patient will need inpatient services for at least 2 midnights  PT Class (Do Not Modify): Inpatient [101]  PT Acc Code (Do Not Modify): Private [1]       B Medical/Surgery History [facility] Pacemaker  A IV Location/Drains/Wounds Patient Lines/Drains/Airways Status   Active Line/Drains/Airways    Name:   Placement date:   Placement time:   Site:   Days:   Peripheral IV 04/30/19 Right Forearm   04/30/19    2204    Forearm   1   Peripheral IV 04/30/19 Left Wrist   04/30/19    2200    Wrist   1          Intake/Output Last 24 hours  Intake/Output Summary (Last 24 hours) at 05/01/2019 0304 Last data filed at 05/01/2019 0246 Gross per 24 hour  Intake 1700 ml  Output -  Net 1700 ml    Labs/Imaging Results for orders placed or performed during the hospital encounter of 04/30/19 (from the past 48 hour(s))   Comprehensive metabolic panel     Status: Abnormal   Collection Time: 04/30/19  9:44 PM  Result Value Ref Range   Sodium 144 135 - 145 mmol/L   Potassium 3.7 3.5 - 5.1 mmol/L   Chloride 108 98 - 111 mmol/L   CO2 25 22 - 32 mmol/L   Glucose, Bld 124 (H) 70 - 99 mg/dL   BUN 16 8 - 23 mg/dL   Creatinine, Ser 3.661.08 (H) 0.44 - 1.00 mg/dL   Calcium 8.8 (L) 8.9 - 10.3 mg/dL   Total Protein 7.8 6.5 - 8.1 g/dL   Albumin 4.0 3.5 - 5.0 g/dL   AST 29 15 - 41 U/L   ALT 22 0 - 44 U/L   Alkaline Phosphatase 60 38 - 126 U/L   Total Bilirubin 1.2 0.3 - 1.2 mg/dL   GFR calc non Af Amer 47 (L) >60 mL/min   GFR calc Af Amer 54 (L) >60 mL/min   Anion gap 11 5 - 15    Comment: Performed at [facility], [address] [place], [place] [address]  CBC WITH DIFFERENTIAL     Status: Abnormal   Collection Time: 04/30/19  9:44 PM  Result Value Ref Range   WBC 14.1 (H) 4.0 - 10.5 K/uL   RBC 5.48 (H) 3.87 - 5.11 MIL/uL   Hemoglobin 15.8 (H) 12.0 - 15.0 g/dL  HCT 49.0 (H) 36.0 - 46.0 %   MCV 89.4 80.0 - 100.0 fL   MCH 28.8 26.0 - 34.0 pg   MCHC 32.2 30.0 - 36.0 g/dL   RDW 16.113.4 09.611.5 - 04.515.5 %   Platelets 174 150 - 400 K/uL   nRBC 0.0 0.0 - 0.2 %   Neutrophils Relative % 74 %   Neutro Abs 10.5 (H) 1.7 - 7.7 K/uL   Lymphocytes Relative 18 %   Lymphs Abs 2.5 0.7 - 4.0 K/uL   Monocytes Relative 8 %   Monocytes Absolute 1.1 (H) 0.1 - 1.0 K/uL   Eosinophils Relative 0 %   Eosinophils Absolute 0.0 0.0 - 0.5 K/uL   Basophils Relative 0 %   Basophils Absolute 0.1 0.0 - 0.1 K/uL   Immature Granulocytes 0 %   Abs Immature Granulocytes 0.06 0.00 - 0.07 K/uL    Comment: Performed at [facility], [address] [place], [place] [address]  APTT     Status: None   Collection Time: 04/30/19  9:44 PM  Result Value Ref Range   aPTT 31 24 - 36 seconds    Comment: Performed at [facility], [address] [place], [place] [address]  Protime-INR     Status: None   Collection Time:  04/30/19  9:44 PM  Result Value Ref Range   Prothrombin Time 13.8 11.4 - 15.2 seconds   INR 1.1 0.8 - 1.2    Comment: (NOTE) INR goal varies based on device and disease states. Performed at [facility], [address] [place], [place] [address]   Lactic acid, plasma     Status: Abnormal   Collection Time: 04/30/19 10:19 PM  Result Value Ref Range   Lactic Acid, Venous 2.3 (HH) 0.5 - 1.9 mmol/L    Comment: CRITICAL RESULT CALLED TO, READ BACK BY AND VERIFIED WITH Casy Brunetto Ramos @2248  04/30/19 MJU Performed at [facility], [address] [place], [place] [address]   SARS Coronavirus 2 (CEPHEID- Performed in [facility] hospital lab), Hosp Order     Status: None   Collection Time: 04/30/19 10:53 PM   Specimen: Nasopharyngeal Swab  Result Value Ref Range   SARS Coronavirus 2 NEGATIVE NEGATIVE    Comment: (NOTE) If result is NEGATIVE SARS-CoV-2 target nucleic acids are NOT DETECTED. The SARS-CoV-2 RNA is generally detectable in upper and lower  respiratory specimens during the acute phase of infection. The lowest  concentration of SARS-CoV-2 viral copies this assay can detect is 250  copies / mL. A negative result does not preclude SARS-CoV-2 infection  and should not be used as the sole basis for treatment or other  patient management decisions.  A negative result may occur with  improper specimen collection / handling, submission of specimen other  than nasopharyngeal swab, presence of viral mutation(s) within the  areas targeted by this assay, and inadequate number of viral copies  (<250 copies / mL). A negative result must be combined with clinical  observations, patient history, and epidemiological information. If result is POSITIVE SARS-CoV-2 target nucleic acids are DETECTED. The SARS-CoV-2 RNA is generally detectable in upper and lower  respiratory specimens dur ing the acute phase of infection.  Positive  results are indicative of active  infection with SARS-CoV-2.  Clinical  correlation with patient history and other diagnostic information is  necessary to determine patient infection status.  Positive results do  not rule out bacterial infection or co-infection with other viruses. If result is  PRESUMPTIVE POSTIVE SARS-CoV-2 nucleic acids MAY BE PRESENT.   A presumptive positive result was obtained on the submitted specimen  and confirmed on repeat testing.  While 2019 novel coronavirus  (SARS-CoV-2) nucleic acids may be present in the submitted sample  additional confirmatory testing may be necessary for epidemiological  and / or clinical management purposes  to differentiate between  SARS-CoV-2 and other Sarbecovirus currently known to infect humans.  If clinically indicated additional testing with an alternate test  methodology [phone]) is advised. The SARS-CoV-2 RNA is generally  detectable in upper and lower respiratory sp ecimens during the acute  phase of infection. The expected result is Negative. Fact Sheet for Patients:  StrictlyIdeas.no Fact Sheet for Healthcare Providers: BankingDealers.co.za This test is not yet approved or cleared by the [place] FDA and has been authorized for detection and/or diagnosis of SARS-CoV-2 by FDA under an Emergency Use Authorization (EUA).  This EUA will remain in effect (meaning this test can be used) for the duration of the COVID-19 declaration under Section 564(b)(1) of the Act, 21 U.S.C. section 360bbb-3(b)(1), unless the authorization is terminated or revoked sooner. Performed at [facility], [address] [place], [place] [address]   Urinalysis, Routine w reflex microscopic     Status: Abnormal   Collection Time: 04/30/19 11:37 PM  Result Value Ref Range   Color, Urine YELLOW (A) YELLOW   APPearance CLEAR (A) CLEAR   Specific Gravity, Urine 1.015 1.005 - 1.030   pH 6.0 5.0 - 8.0   Glucose, UA NEGATIVE  NEGATIVE mg/dL   Hgb urine dipstick MODERATE (A) NEGATIVE   Bilirubin Urine NEGATIVE NEGATIVE   Ketones, ur 5 (A) NEGATIVE mg/dL   Protein, ur 30 (A) NEGATIVE mg/dL   Nitrite NEGATIVE NEGATIVE   Leukocytes,Ua LARGE (A) NEGATIVE   RBC / HPF 0-5 0 - 5 RBC/hpf   WBC, UA >50 (H) 0 - 5 WBC/hpf   Bacteria, UA RARE (A) NONE SEEN   Squamous Epithelial / LPF 0-5 0 - 5   Mucus PRESENT     Comment: Performed at [facility], [address] [place], [place] [address]  Lactic acid, plasma     Status: Abnormal   Collection Time: 05/01/19  1:34 AM  Result Value Ref Range   Lactic Acid, Venous 2.1 (HH) 0.5 - 1.9 mmol/L    Comment: CRITICAL RESULT CALLED TO, READ BACK BY AND VERIFIED WITH Hester Mates RN AT 0155 ON 05/01/2019 [facility] Performed at [facility], [address] [place], [place] [address]    Dg Chest Port 1 View  Result Date: 04/30/2019 CLINICAL DATA:  Fever EXAM: PORTABLE CHEST 1 VIEW COMPARISON:  None. FINDINGS: There is a leftchest wall pacemakerwith leads projecting within the right atrium and right ventricle. The heart size and mediastinal contours are within normal limits. Both lungs are clear. The visualized skeletal structures are unremarkable. IMPRESSION: No active disease. Electronically Signed   By: Ulyses Jarred M.D.   On: 04/30/2019 22:25    Pending Labs Unresulted Labs (From admission, onward)    Start     Ordered   05/08/19 0500  Creatinine, serum  (enoxaparin (LOVENOX)    CrCl >/= 30 ml/min)  Weekly,   STAT    Comments: while on enoxaparin therapy    05/01/19 0230   05/01/19 0500  Protime-INR  Tomorrow morning,   STAT     05/01/19 0230   05/01/19 0500  Cortisol-am, blood  Tomorrow morning,   STAT  05/01/19 0230   05/01/19 0500  Procalcitonin  Tomorrow morning,   STAT     05/01/19 0230   05/01/19 0500  Basic metabolic panel  Tomorrow morning,   STAT     05/01/19 0230   05/01/19 0500  CBC  Tomorrow morning,   STAT     05/01/19 0230    05/01/19 0231  Urine culture  Once,   STAT    Question:  Patient immune status  Answer:  Normal   05/01/19 0230   05/01/19 0231  TSH  Once,   STAT     05/01/19 0230   05/01/19 0133  Lactic acid, plasma  ONCE - STAT,   STAT     05/01/19 0132   04/30/19 2133  Blood Culture (routine x 2)  BLOOD CULTURE X 2,   STAT     04/30/19 2132   04/30/19 2133  Urine culture  ONCE - STAT,   STAT     04/30/19 2132          Vitals/Pain Today's Vitals   05/01/19 0100 05/01/19 0131 05/01/19 0200 05/01/19 0230  BP: (!) 157/75 (!) 135/56 (!) 140/100 (!) 137/53  Pulse: 81 80 90 78  Resp: 20 (!) 24 20 (!) 21  Temp:      TempSrc:      SpO2: 95% 94% 96% 96%  Weight:      Height:      PainSc:        Isolation Precautions No active isolations  Medications Medications  donepezil (ARICEPT) tablet 5 mg (has no administration in time range)  fluticasone (FLONASE) 50 MCG/ACT nasal spray 2 spray (has no administration in time range)  folic acid (FOLVITE) tablet 1 mg (has no administration in time range)  levothyroxine (SYNTHROID) tablet 25 mcg (has no administration in time range)  memantine (NAMENDA) tablet 10 mg (has no administration in time range)  raloxifene (EVISTA) tablet 60 mg (has no administration in time range)  fesoterodine (TOVIAZ) tablet 4 mg (has no administration in time range)  venlafaxine XR (EFFEXOR-XR) 24 hr capsule 75 mg (has no administration in time range)  enoxaparin (LOVENOX) injection 40 mg (has no administration in time range)  0.9 %  sodium chloride infusion ( Intravenous New Bag/Given 05/01/19 0300)  acetaminophen (TYLENOL) tablet 650 mg (has no administration in time range)    Or  acetaminophen (TYLENOL) suppository 650 mg (has no administration in time range)  ondansetron (ZOFRAN) tablet 4 mg (has no administration in time range)    Or  ondansetron (ZOFRAN) injection 4 mg (has no administration in time range)  sodium chloride 0.9 % bolus 1,000 mL (0 mLs Intravenous  Stopped 04/30/19 2340)  ceFEPIme (MAXIPIME) 2 g in sodium chloride 0.9 % 100 mL IVPB (0 g Intravenous Stopped 04/30/19 2325)  metroNIDAZOLE (FLAGYL) IVPB 500 mg (0 mg Intravenous Stopped 05/01/19 0041)  vancomycin (VANCOCIN) 1,500 mg in sodium chloride 0.9 % 500 mL IVPB (0 mg Intravenous Stopped 05/01/19 0246)    Mobility walks with device Moderate fall risk   Focused Assessments   R Recommendations: See Admitting Provider Note  Report given to:   Additional Notes: Has MPH, causes some confusion at baseline, but daughter states patient is a little more confused than normal.

## 2019-05-01 NOTE — TOC Initial Note (Signed)
Transition of Care [facility]) - Initial/Assessment Note    Patient Details  Name: Kerri [name] MRN: [id number] Date of Birth: 1931-06-01  Transition [facility] Care [facility]) CM/SW Contact:    Shade Flood, LCSW Phone Number: 05/01/2019, 2:05 PM  Clinical Narrative:                  Pt admitted from home. Received CSW consult for pt with dementia and lives alone. Pt more alert and oriented today. She plans to return home at dc. Also spoke with pt's son by phone. He states that pt's daughter lives next door to patient and he lives about 5 minutes away. He states pt is independent in ADLs at home. She uses a cane for ambulation but she does also have a walker. Pt does not cook big meals. Son states he wishes she would eat better stating that she mostly eats sandwiches and "junk food". Pt's son takes pt to appointments (she does not drive) and he states that they do not have any difficulty obtaining her medications. Pt has private pay help once a week for cleaning, shopping, etc.   Discussed pt's level of dementia with pt's son. He states that she is forgetful but that in her own home, she functions well. He states that she does not wander and that she can use the stove to make eggs or something simple. He states that they have not been concerned about her safety.  Son states that pt has had SNF rehab in the past but that if she needs rehab after this stay, they would like her to go home with [facility] due to Covid. She has had [place] before and son thinks it was with [facility] but he is not sure. Son states that if pt needs someone to stay with her at dc, that family will make sure that happens.  Son also states that pt has normal pressure hydrocephalus and is scheduled to see Dr. Sunday Corn by televisit on Thursday 7/23. He asked if pt could be seen here for that. Explained that LCSW would relay that question to attending. Son aware that if neurology does not see pt here and pt remains hospitalized on 7/23, this virtual  visit can be re-scheduled.   Anticipating pt may have HH needs at dc. TOC will follow and assist as needed.  Expected Discharge Plan: [place] Barriers to Discharge: Continued Medical Work up   Patient Goals and CMS Choice Patient states their goals for this hospitalization and ongoing recovery are:: Return home      Expected Discharge Plan and Services Expected Discharge Plan: [place] In-house Referral: Clinical Social Work     Living arrangements for the past 2 months: [place]                                      Prior Living Arrangements/Services Living arrangements for the past 2 months: Single Family Home Lives with:: Self Patient language and need for interpreter reviewed:: Yes Do you feel safe going back to the place where you live?: Yes      Need for Family Participation in Patient Care: Yes (Comment) Care giver support system in place?: Yes (comment) Current home services: DME, Housekeeping Criminal Activity/Legal Involvement Pertinent to Current Situation/Hospitalization: No - Comment as needed  Activities of Daily Living Home Assistive Devices/Equipment: Cane (specify quad or straight), Environmental consultant (  specify type) ADL Screening (condition at time of admission) Patient's cognitive ability adequate to safely complete daily activities?: No Is the patient deaf or have difficulty hearing?: No Does the patient have difficulty seeing, even when wearing glasses/contacts?: No Does the patient have difficulty concentrating, remembering, or making decisions?: Yes Patient able to express need for assistance with ADLs?: No Does the patient have difficulty dressing or bathing?: No Independently performs ADLs?: Yes (appropriate for developmental age) Does the patient have difficulty walking or climbing stairs?: Yes Weakness of Legs: Both Weakness of Arms/Hands: Both  Permission Sought/Granted                   Emotional Assessment Appearance:: Appears stated age Attitude/Demeanor/Rapport: Engaged Affect (typically observed): Pleasant Orientation: : Oriented to Self, Oriented to Place, Oriented to Situation Alcohol / Substance Use: Not Applicable Psych Involvement: No (comment)  Admission diagnosis:  Altered mental status, unspecified altered mental status type [R41.82] Sepsis, due to unspecified organism, unspecified whether acute organ dysfunction present [facility](HCC) [A41.9] Patient Active Problem List   Diagnosis Date Noted  . Sepsis (HCC) 05/01/2019   PCP:  Wilford CornerWhitaker, Jason Hestle, PA-C Pharmacy:   [facility] [phone][facility][id number] [place]- [place], [place] - [address] [place] [address] Phone: [phone] Fax: [phone]  [facility] [place] [address]enee [address]er[address] [place] [address] Phone: [phone] Fax: [phone]     Social Determinants of Health (SDOH) Interventions    Readmission Risk Interventions Readmission Risk Prevention Plan 05/01/2019  Medication Screening Complete  Transportation Screening Complete  Some recent data might be hidden

## 2019-05-02 LAB — CBC
HCT: 39.3 % (ref 36.0–46.0)
Hemoglobin: 12.8 g/dL (ref 12.0–15.0)
MCH: 28.4 pg (ref 26.0–34.0)
MCHC: 32.6 g/dL (ref 30.0–36.0)
MCV: 87.3 fL (ref 80.0–100.0)
Platelets: 144 10*3/uL — ABNORMAL LOW (ref 150–400)
RBC: 4.5 MIL/uL (ref 3.87–5.11)
RDW: 13.5 % (ref 11.5–15.5)
WBC: 9.6 10*3/uL (ref 4.0–10.5)
nRBC: 0 % (ref 0.0–0.2)

## 2019-05-02 LAB — URINE CULTURE

## 2019-05-02 LAB — BASIC METABOLIC PANEL
Anion gap: 6 (ref 5–15)
BUN: 19 mg/dL (ref 8–23)
CO2: 21 mmol/L — ABNORMAL LOW (ref 22–32)
Calcium: 7.4 mg/dL — ABNORMAL LOW (ref 8.9–10.3)
Chloride: 113 mmol/L — ABNORMAL HIGH (ref 98–111)
Creatinine, Ser: 0.97 mg/dL (ref 0.44–1.00)
GFR calc Af Amer: >60 mL/min (ref >60)
GFR calc non Af Amer: 53 mL/min — ABNORMAL LOW (ref >60)
Glucose, Bld: 101 mg/dL — ABNORMAL HIGH (ref 70–99)
Potassium: 3.5 mmol/L (ref 3.5–5.1)
Sodium: 140 mmol/L (ref 135–145)

## 2019-05-02 LAB — MAGNESIUM: Magnesium: 2 mg/dL (ref 1.7–2.4)

## 2019-05-02 NOTE — Evaluation (Signed)
Physical Therapy Evaluation Patient Details Name: MATALYNN GRAFF MRN: [id number] DOB: 1931-04-10 Today's Date: 05/02/2019   History of Present Illness  85 y.o. female presents with fevers, weakness mild dysuria and lower abdominal pain. Admitted on 04/30/2019. PMH: cardiac arrhythmia, dementia, depression, UTI, hyperlipidemia, hypertension, hypothyroidism, CKD stage III. Dx: sepsis sencondary to UTI.  Clinical Impression  Prior to hospital admission, pt was independent and ambulate with cane and occasionally RW. Pt lives alone in a 2-story house and 3 steps to entry with B sides of railing. CGA throughout the session unless otherwise noted. Currently pt is ambulating with RW for 80 feet. Able transfer from supine to sitting on EOB. Light headed noted. Vital monitored and stable(see detailed below). Pt presents with right and posterior leaning when sitting and requires RUE support from bed to maintain balance. Pt relys on heavy verbal cueing to maintain ambulation inside of the RW and hand placements. Unable to maintain body positioning without cueing.  Pt would benefit from skilled PT to address noted impairments and functional limitations (see below for any additional details).  Upon hospital discharge, recommend pt discharge to [facility] for strengthening, muscle endurance, ambulation tolerance to improve functional mobility and ADLs. Also recommended 24 hour supervision/assistance for a safe functional mobility.    Follow Up Recommendations Home health PT;Supervision/Assistance - 24 hour    Equipment Recommendations  Rolling walker with 5" wheels;3in1 (PT)(Pt states has RW at home)    Recommendations for Other Services OT consult     Precautions / Restrictions Precautions Precautions: Fall Precaution Comments: Weaker on R LE Restrictions Weight Bearing Restrictions: No      Mobility  Bed Mobility Overal bed mobility: Needs Assistance Bed Mobility: Supine to Sit     Supine to sit: Min  guard     General bed mobility comments: pt able to initiate supine to sitting on OOB with help with bed railing. Pt presents with tendences to stand up without commands , and requires CGA to maintain sitting done on EOB. Pt reports light-headed and BP monitored with 128/69. Symptom resolved after sitting for 6 min.  Transfers Overall transfer level: Needs assistance Equipment used: Rolling walker (2 wheeled) Transfers: Sit to/from Stand Sit to Stand: Min guard         General transfer comment: CGA for transfer standing from sitting on EOB. Vc's requires for hand placements and RW management.  Ambulation/Gait Ambulation/Gait assistance: Min guard Gait Distance (Feet): 80 Feet Assistive device: Rolling walker (2 wheeled) Gait Pattern/deviations: Narrow base of support;Decreased step length - right;Decreased stance time - right Gait velocity: Decreased   General Gait Details: CGA ambulating with RW. Partial step through and heavy verbal and tacticle cues provided for RW management with hand placements and body positioning. Pt able to correct with cueing but unable to maintain staying inside of the RW. Significant forward posture presents and cannot maintain forward gazing.  Stairs            Wheelchair Mobility    Modified Rankin (Stroke Patients Only)       Balance Overall balance assessment: Needs assistance Sitting-balance support: Single extremity supported Sitting balance-Leahy Scale: Fair Sitting balance - Comments: Requires at least one UE to maintain sitting balance Postural control: Right lateral lean;Posterior lean Standing balance support: Bilateral upper extremity supported Standing balance-Leahy Scale: Poor Standing balance comment: Requires BUE support for standing balance.  Pertinent Vitals/Pain Pain Assessment: Faces Faces Pain Scale: Hurts a little bit Pain Descriptors / Indicators: Discomfort Pain  Intervention(s): Limited activity within patient's tolerance;Monitored during session;Repositioned    Home Living Family/patient expects to be discharged to:: Private residence Living Arrangements: Alone Available Help at Discharge: Family;Available 24 hours/day Type of Home: House Home Access: Stairs to enter Entrance Stairs-Rails: Can reach both Entrance Stairs-Number of Steps: 3 Home Layout: Two level;Able to live on main level with bedroom/bathroom Home Equipment: Dan HumphreysWalker - 2 wheels;Cane - single point      Prior Function Level of Independence: Independent with assistive device(s)         Comments: Pt reports baseline ambulating with SPC and occationally use RW.     Hand Dominance        Extremity/Trunk Assessment   Upper Extremity Assessment Upper Extremity Assessment: Overall WFL for tasks assessed(At least 3/5 with BUE shoulder flexion, elbow flexion and strong grip strength.)    Lower Extremity Assessment Lower Extremity Assessment: Overall WFL for tasks assessed(At least 4/5 with BLE hip flexion, knee extension/flexion, and 5/5 B feet dorsiflexion and plantar flexion.)    Cervical / Trunk Assessment Cervical / Trunk Assessment: Kyphotic  Communication   Communication: No difficulties  Cognition Arousal/Alertness: Awake/alert Behavior During Therapy: WFL for tasks assessed/performed;Impulsive Overall Cognitive Status: History of cognitive impairments - at baseline Area of Impairment: Orientation                 Orientation Level: Person;Place;Situation             General Comments: Able to follow simple command.      General Comments General comments (skin integrity, edema, etc.): Swelling noted on R UE.    Exercises General Exercises - Lower Extremity Straight Leg Raises: AROM;Strengthening;Both;10 reps;Seated Hip Flexion/Marching: AROM;Strengthening;Both;10 reps;Seated   Assessment/Plan    PT Assessment Patient needs continued PT  services  PT Problem List Decreased strength;Decreased range of motion;Decreased activity tolerance;Decreased balance;Decreased knowledge of use of DME;Decreased mobility;Decreased safety awareness;Decreased knowledge of precautions       PT Treatment Interventions DME instruction;Gait training;Functional mobility training;Stair training;Balance training;Therapeutic exercise;Therapeutic activities    PT Goals (Current goals can be found in the Care Plan section)  Acute Rehab PT Goals Patient Stated Goal: to go home PT Goal Formulation: With patient Time For Goal Achievement: 05/16/19 Potential to Achieve Goals: Good    Frequency Min 2X/week   Barriers to discharge        Co-evaluation               AM-PAC PT "6 Clicks" Mobility  Outcome Measure Help needed turning from your back to your side while in a flat bed without using bedrails?: A Little Help needed moving from lying on your back to sitting on the side of a flat bed without using bedrails?: A Little Help needed moving to and from a bed to a chair (including a wheelchair)?: A Little Help needed standing up from a chair using your arms (e.g., wheelchair or bedside chair)?: A Little Help needed to walk in hospital room?: A Little Help needed climbing 3-5 steps with a railing? : A Little 6 Click Score: 18    End of Session Equipment Utilized During Treatment: Gait belt Activity Tolerance: Patient tolerated treatment well Patient left: in chair;with call bell/phone within reach;with chair alarm set;with nursing/sitter in room(SCD was not applied d/t urine incontinence and BLE pressure cuff satuated by urine. Nurse notified and new pressure cuff  brought to the  room by nurse before PT session ends.) Nurse Communication: Mobility status;Weight bearing status;Precautions;Other (comment)(Purewick was removed by nurse at the beginning of the session and nurse presents room with new replacement purewick when PT session end.) PT  Visit Diagnosis: Unsteadiness on feet (R26.81);Other abnormalities of gait and mobility (R26.89);Muscle weakness (generalized) (M62.81);Difficulty in walking, not elsewhere classified (R26.2)    Time: [id number] PT Time Calculation (min) (ACUTE ONLY): 57 min   Charges:              Nelly RoutNan Kourtney Montesinos, SPT 05/02/2019, 3:54 PM

## 2019-05-02 NOTE — Progress Notes (Signed)
[place] [facility] [place] NAME: Kerri [name]    MR#:  [id number]  DATE OF BIRTH:  03/10/1931  SUBJECTIVE:  CHIEF COMPLAINT:   Chief Complaint  Patient presents with  . Altered Mental Status   No new complaint this morning.  Patient awake and alert and oriented this morning.  No fevers. REVIEW OF SYSTEMS:  Review of Systems  Constitutional: Negative for chills and fever.  HENT: Negative for hearing loss and tinnitus.   Eyes: Negative for blurred vision and double vision.  Respiratory: Negative for cough and hemoptysis.   Cardiovascular: Negative for chest pain and palpitations.  Gastrointestinal: Negative for heartburn and nausea.  Genitourinary: Negative for dysuria and urgency.  Musculoskeletal: Negative for myalgias and neck pain.  Skin: Negative for itching and rash.  Neurological: Negative for dizziness and headaches.  Psychiatric/Behavioral: Negative for depression and hallucinations.    DRUG ALLERGIES:   Allergies  Allergen Reactions  . Bupropion Other (See Comments)    Tremors  Tremors    . Rofecoxib Diarrhea  . Alendronate Rash   VITALS:  Blood pressure 112/72, pulse 72, temperature 98.5 F (36.9 C), temperature source Oral, resp. rate 16, height 5\' 8"  (1.727 m), weight 77 kg, SpO2 92 %. PHYSICAL EXAMINATION:  Physical Exam  GENERAL:  85 y.o.-year-old patient lying in the bed with no acute distress.  EYES: Pupils equal, round, reactive to light and accommodation. No scleral icterus. Extraocular muscles intact.  HEENT: Head atraumatic, normocephalic. Oropharynx and nasopharynx clear.  NECK:  Supple, no jugular venous distention. No thyroid enlargement, no tenderness.  LUNGS: Normal breath sounds bilaterally, no wheezing, rales,rhonchi or crepitation. No use of accessory muscles of respiration.  CARDIOVASCULAR: Regular rate and rhythm, S1, S2 normal. No murmurs, rubs, or gallops.  ABDOMEN: Soft, nondistended, Bowel  sounds present. No organomegaly or mass.  EXTREMITIES: No pedal edema, cyanosis, or clubbing.  NEUROLOGIC: Cranial nerves II through XII are intact. Muscle strength 5/5 in all extremities. Sensation intact. Gait not checked.  PSYCHIATRIC: The patient is alert and oriented x person and place only.  Normal affect and good eye contact. SKIN: No obvious rash, lesion, or ulcer.   LABORATORY PANEL:  Female CBC Recent Labs  Lab 05/02/19 0521  WBC 9.6  HGB 12.8  HCT 39.3  PLT 144*   ------------------------------------------------------------------------------------------------------------------ Chemistries  Recent Labs  Lab 04/30/19 2144  05/02/19 0521  NA 144   < > 140  K 3.7   < > 3.5  CL 108   < > 113*  CO2 25   < > 21*  GLUCOSE 124*   < > 101*  BUN 16   < > 19  CREATININE 1.08*   < > 0.97  CALCIUM 8.8*   < > 7.4*  MG  --   --  2.0  AST 29  --   --   ALT 22  --   --   ALKPHOS 60  --   --   BILITOT 1.2  --   --    < > = values in this interval not displayed.   RADIOLOGY:  No results found. ASSESSMENT AND PLAN:    1.  Sepsis secondary to UTI - Patient received 2 L normal saline bolus currently with normal saline infusing to a peripheral IV at 100 cc/h - Broad-spectrum antibiotic therapy has been initiated in the emergency room and continued with cefepime and vancomycin.  Urine culture concerning for contaminant.  Repeat urinalysis requested.  Lactic acid level down to normal. Initiated de-escalation of antibiotics by discontinuing vancomycin. Patient had fevers with temperature of 101.3 last night.  Patient needs to be febrile for at least 24 hours before discharge.  2.  Urinary tract infection -Follow-up on repeat urinalysis and urine culture. - Cefepime and IV vancomycin initiated.  Discontinued vancomycin  3. hypothyroidism - Continue Synthroid  4.  Dementia -Social service consulted for evaluation of patient's living arrangement for safety.  Case manager is  already following. -Patient awake and alert and oriented this morning. - Aricept continued  DVT prophylaxis ; Lovenox   All the records are reviewed and case discussed with Care Management/Social Worker. Management plans discussed with the patient, family and they are in agreement. I called and updated patient's son Mr. Leonette MostCharles.  He is in agreement with the plans of care as outlined.  He agrees with patient's decision to be DNR/DNI going forward.  CODE STATUS: DNR/DNI  TOTAL TIME TAKING CARE OF THIS PATIENT: 35 minutes.   More than 50% of the time was spent in counseling/coordination of care: YES  POSSIBLE D/C IN 2 DAYS, DEPENDING ON CLINICAL CONDITION.   Kianni Lheureux M.D on 05/02/2019 at 12:53 PM  Between 7am to 6pm - Pager - [phone]  After 6pm go to [email or web address] - Scientist, research (life sciences)password [email or web address] [email or web address]  [employer] [place] [facility]  Office  [phone]  CC: Primary care physician; Whitaker, Jonnie FinnerJason Hestle, PA-C  Note: This dictation was prepared with Dragon dictation along with smaller phrase technology. Any transcriptional errors that result from this process are unintentional.

## 2019-05-03 LAB — URINALYSIS, ROUTINE W REFLEX MICROSCOPIC
Bacteria, UA: NONE SEEN
Bilirubin Urine: NEGATIVE
Glucose, UA: NEGATIVE mg/dL
Hgb urine dipstick: NEGATIVE
Ketones, ur: NEGATIVE mg/dL
Nitrite: NEGATIVE
Protein, ur: NEGATIVE mg/dL
Specific Gravity, Urine: 1.006 (ref 1.005–1.030)
pH: 7 (ref 5.0–8.0)

## 2019-05-03 MED ORDER — BISACODYL 10 MG RE SUPP
10.0000 mg | Freq: Every day | RECTAL | Status: DC | PRN
  Administered 2019-05-03: 10:00:00 10 mg via RECTAL
  Filled 2019-05-03: qty 1

## 2019-05-03 MED ORDER — FLUTICASONE PROPIONATE 50 MCG/ACT NA SUSP: 2.0000 | Freq: Every day | NASAL | 0 refills | Status: DC

## 2019-05-03 MED ORDER — CEPHALEXIN 250 MG PO CAPS: 250.0000 mg | ORAL_CAPSULE | Freq: Two times a day (BID) | ORAL | 0 refills | Status: AC

## 2019-05-03 MED ORDER — DOCUSATE SODIUM 100 MG PO CAPS
100.0000 mg | ORAL_CAPSULE | Freq: Two times a day (BID) | ORAL | Status: DC
  Administered 2019-05-03: 10:00:00 100 mg via ORAL
  Filled 2019-05-03: qty 1

## 2019-05-03 NOTE — Discharge Summary (Signed)
[employer] [facility] [place] at [facility]   PATIENT NAME: Kerri [name]    MR#:  [id number]  DATE OF BIRTH:  July 29, 1931  DATE OF ADMISSION:  04/30/2019   ADMITTING PHYSICIAN: Hannah BeatJan A Mansy, MD  DATE OF DISCHARGE: 05/03/2019  PRIMARY CARE PHYSICIAN: Wilford CornerWhitaker, Jason Hestle, PA-C   ADMISSION DIAGNOSIS:  Altered mental status, unspecified altered mental status type [R41.82] Sepsis, due to unspecified organism, unspecified whether acute organ dysfunction present (HCC) [A41.9] DISCHARGE DIAGNOSIS:  Active Problems:   Sepsis (HCC)  SECONDARY DIAGNOSIS:   Past Medical History:  Diagnosis Date   Arthritis    HOSPITAL COURSE:  Chief complaint; altered mental status  History of presenting complaint; Kerri [name]  is a 85 y.o. female with a known history of cardiac arrhythmia, dementia, depression, UTI, hyperlipidemia, hypertension, hypothyroidism, CKD stage III.  According to patient and her daughter, patient lives at home by herself.  She usually uses a walker for ambulatory assistance.  Patient reports she has been weak over the last 2 to 3 days unable to perform her ADLs as usual.  Patient evaluated in the emergency room and diagnosed with urinary tract infection.  Admitted to medical service for further evaluation.   Hospital course; 1.Sepsis secondary to UTI Patient received 2 L normal saline bolus on admission and has been adequately hydrated with IV fluids.  Patient was initially treated with broad-spectrum IV antibiotics with IV vancomycin and cefepime.  Clinically improved significantly.  Vancomycin previously discontinued.  Initial urine culture concerning for contaminant.  Repeat urinalysis shows improvement in UTI.  Being discharged empirically on p.o. Keflex for a few more days to complete treatment duration.  Lactic acid level down to normal.  Patient remains afebrile for more than 24 hours.  Clinically and hemodynamically stable and wishes to be discharged  home.    2. Urinary tract infection Treated with IV antibiotics as mentioned above.  Being discharged on p.o. antibiotics  3.hypothyroidism -Continue Synthroid 4. Dementia -Social service consulted for evaluation of patient's living arrangement for safety.  Case manager evaluated patient.  Also seen by physical therapist.  Plan is for discharge home with home health services.  I called and updated son prior to discharge.  They have already made arrangements for patient to have 24-hour supervision at home as well. 5.  Constipation Patient had a bowel movement this morning with Dulcolax.   DISCHARGE CONDITIONS:  Stable CONSULTS OBTAINED:   DRUG ALLERGIES:   Allergies  Allergen Reactions   Bupropion Other (See Comments)    Tremors  Tremors     Rofecoxib Diarrhea   Alendronate Rash   DISCHARGE MEDICATIONS:   Allergies as of 05/03/2019      Reactions   Bupropion Other (See Comments)   Tremors  Tremors    Rofecoxib Diarrhea   Alendronate Rash      Medication List    TAKE these medications   acetaminophen 325 MG tablet Commonly known as: TYLENOL Take 650 mg by mouth 3 (three) times daily as needed for moderate pain.   cephALEXin 250 MG capsule Commonly known as: KEFLEX Take 1 capsule (250 mg total) by mouth 2 (two) times daily for 3 days.   diclofenac sodium 1 % Gel Commonly known as: VOLTAREN Apply 2 g topically 4 (four) times daily.   donepezil 5 MG tablet Commonly known as: ARICEPT Take 5 mg by mouth at bedtime.   fluticasone 50 MCG/ACT nasal spray Commonly known as: FLONASE Place 2 sprays into both nostrils daily.  folic acid 1 MG tablet Commonly known as: FOLVITE Take 1 mg by mouth daily.   levothyroxine 25 MCG tablet Commonly known as: SYNTHROID TAKE 1 TABLET EVERY DAY EXCEPT Wednesday AND Sunday TAKE 2 TABLETS  TAKE ON EMPTY STOMACH   memantine 10 MG tablet Commonly known as: NAMENDA TAKE ONE TABLET TWICE DAILY   naproxen sodium 220 MG  tablet Commonly known as: ALEVE Take 220 mg by mouth 2 (two) times daily with a meal.   omega-3 acid ethyl esters 1 g capsule Commonly known as: LOVAZA Take 1 g by mouth daily.   raloxifene 60 MG tablet Commonly known as: EVISTA TAKE ONE TABLET EVERY DAY   tolterodine 4 MG 24 hr capsule Commonly known as: DETROL LA Take 4 mg by mouth daily.   venlafaxine XR 75 MG 24 hr capsule Commonly known as: EFFEXOR-XR Take 75 mg by mouth daily with breakfast.            Durable Medical Equipment  (From admission, onward)         Start     Ordered   05/03/19 1128  For home use only DME Bedside commode  Once    Question:  Patient needs a bedside commode to treat with the following condition  Answer:  Unsteady gait   05/03/19 1127           DISCHARGE INSTRUCTIONS:   DIET:  Cardiac diet DISCHARGE CONDITION:  Stable ACTIVITY:  Activity as tolerated OXYGEN:  Home Oxygen: No.  Oxygen Delivery: room air DISCHARGE LOCATION:  home   If you experience worsening of your admission symptoms, develop shortness of breath, life threatening emergency, suicidal or homicidal thoughts you must seek medical attention immediately by calling 911 or calling your MD immediately  if symptoms less severe.  You Must read complete instructions/literature along with all the possible adverse reactions/side effects for all the Medicines you take and that have been prescribed to you. Take any new Medicines after you have completely understood and accpet all the possible adverse reactions/side effects.   Please note  You were cared for by a hospitalist during your hospital stay. If you have any questions about your discharge medications or the care you received while you were in the hospital after you are discharged, you can call the unit and asked to speak with the hospitalist on call if the hospitalist that took care of you is not available. Once you are discharged, your primary care physician will  handle any further medical issues. Please note that NO REFILLS for any discharge medications will be authorized once you are discharged, as it is imperative that you return to your primary care physician (or establish a relationship with a primary care physician if you do not have one) for your aftercare needs so that they can reassess your need for medications and monitor your lab values.    On the day of Discharge:  VITAL SIGNS:  Blood pressure 139/79, pulse 73, temperature 97.7 F (36.5 C), temperature source Oral, resp. rate 16, height 5\' 8"  (1.727 m), weight 77 kg, SpO2 93 %. PHYSICAL EXAMINATION:  GENERAL:  85 y.o.-year-old patient lying in the bed with no acute distress.  EYES: Pupils equal, round, reactive to light and accommodation. No scleral icterus. Extraocular muscles intact.  HEENT: Head atraumatic, normocephalic. Oropharynx and nasopharynx clear.  NECK:  Supple, no jugular venous distention. No thyroid enlargement, no tenderness.  LUNGS: Normal breath sounds bilaterally, no wheezing, rales,rhonchi or crepitation. No use of accessory  muscles of respiration.  CARDIOVASCULAR: S1, S2 normal. No murmurs, rubs, or gallops.  ABDOMEN: Soft, non-tender, non-distended. Bowel sounds present. No organomegaly or mass.  EXTREMITIES: No pedal edema, cyanosis, or clubbing.  NEUROLOGIC: Cranial nerves II through XII are intact. Muscle strength 5/5 in all extremities. Sensation intact. Gait not checked.  PSYCHIATRIC: The patient is alert and oriented x 3.  SKIN: No obvious rash, lesion, or ulcer.  DATA REVIEW:   CBC Recent Labs  Lab 05/02/19 0521  WBC 9.6  HGB 12.8  HCT 39.3  PLT 144*    Chemistries  Recent Labs  Lab 04/30/19 2144  05/02/19 0521  NA 144   < > 140  K 3.7   < > 3.5  CL 108   < > 113*  CO2 25   < > 21*  GLUCOSE 124*   < > 101*  BUN 16   < > 19  CREATININE 1.08*   < > 0.97  CALCIUM 8.8*   < > 7.4*  MG  --   --  2.0  AST 29  --   --   ALT 22  --   --   ALKPHOS  60  --   --   BILITOT 1.2  --   --    < > = values in this interval not displayed.     Microbiology Results  Results for orders placed or performed during the hospital encounter of 04/30/19  Blood Culture (routine x 2)     Status: None (Preliminary result)   Collection Time: 04/30/19  9:45 PM   Specimen: BLOOD  Result Value Ref Range Status   Specimen Description BLOOD LEFT WRIST  Final   Special Requests   Final    BOTTLES DRAWN AEROBIC AND ANAEROBIC Blood Culture results may not be optimal due to an excessive volume of blood received in culture bottles   Culture   Final    NO GROWTH 2 DAYS Performed at [facility], [address] [place], [place] [address]    Report Status PENDING  Incomplete  Blood Culture (routine x 2)     Status: None (Preliminary result)   Collection Time: 04/30/19  9:45 PM   Specimen: BLOOD  Result Value Ref Range Status   Specimen Description BLOOD RIGHT WRIST  Final   Special Requests   Final    BOTTLES DRAWN AEROBIC AND ANAEROBIC Blood Culture results may not be optimal due to an inadequate volume of blood received in culture bottles   Culture   Final    NO GROWTH 2 DAYS Performed at [facility], [address] [place], [place] [address]    Report Status PENDING  Incomplete  SARS Coronavirus 2 (CEPHEID- Performed in [place] hospital lab), Hosp Order     Status: None   Collection Time: 04/30/19 10:53 PM   Specimen: Nasopharyngeal Swab  Result Value Ref Range Status   SARS Coronavirus 2 NEGATIVE NEGATIVE Final    Comment: (NOTE) If result is NEGATIVE SARS-CoV-2 target nucleic acids are NOT DETECTED. The SARS-CoV-2 RNA is generally detectable in upper and lower  respiratory specimens during the acute phase of infection. The lowest  concentration of SARS-CoV-2 viral copies this assay can detect is 250  copies / mL. A negative result does not preclude SARS-CoV-2 infection  and should not be used as the sole basis for  treatment or other  patient management decisions.  A negative result may occur with  improper specimen collection /  handling, submission of specimen other  than nasopharyngeal swab, presence of viral mutation(s) within the  areas targeted by this assay, and inadequate number of viral copies  (<250 copies / mL). A negative result must be combined with clinical  observations, patient history, and epidemiological information. If result is POSITIVE SARS-CoV-2 target nucleic acids are DETECTED. The SARS-CoV-2 RNA is generally detectable in upper and lower  respiratory specimens dur ing the acute phase of infection.  Positive  results are indicative of active infection with SARS-CoV-2.  Clinical  correlation with patient history and other diagnostic information is  necessary to determine patient infection status.  Positive results do  not rule out bacterial infection or co-infection with other viruses. If result is PRESUMPTIVE POSTIVE SARS-CoV-2 nucleic acids MAY BE PRESENT.   A presumptive positive result was obtained on the submitted specimen  and confirmed on repeat testing.  While 2019 novel coronavirus  (SARS-CoV-2) nucleic acids may be present in the submitted sample  additional confirmatory testing may be necessary for epidemiological  and / or clinical management purposes  to differentiate between  SARS-CoV-2 and other Sarbecovirus currently known to infect humans.  If clinically indicated additional testing with an alternate test  methodology [phone]([id number]) is advised. The SARS-CoV-2 RNA is generally  detectable in upper and lower respiratory sp ecimens during the acute  phase of infection. The expected result is Negative. Fact Sheet for Patients:  BoilerBrush.com.cyhttps://www.fda.gov/media/136312/download Fact Sheet for Healthcare Providers: https://pope.com/https://www.fda.gov/media/136313/download This test is not yet approved or cleared by the [place] FDA and has been authorized for detection and/or  diagnosis of SARS-CoV-2 by FDA under an Emergency Use Authorization (EUA).  This EUA will remain in effect (meaning this test can be used) for the duration of the COVID-19 declaration under Section 564(b)(1) of the Act, 21 U.S.C. section 360bbb-3(b)(1), unless the authorization is terminated or revoked sooner. Performed at [facility], [address] [place], [place] [address]   Urine culture     Status: Abnormal   Collection Time: 04/30/19 11:37 PM   Specimen: Urine, Random  Result Value Ref Range Status   Specimen Description   Final    URINE, RANDOM Performed at [facility], [address] [place], [place] [address]    Special Requests   Final    NONE Performed at [facility], [address] [place], [place] [address]    Culture MULTIPLE SPECIES PRESENT, SUGGEST RECOLLECTION (A)  Final   Report Status 05/02/2019 FINAL  Final    RADIOLOGY:  No results found.   Management plans discussed with the patient, family and they are in agreement.  CODE STATUS: DNR   TOTAL TIME TAKING CARE OF THIS PATIENT: 39 minutes.    Tavon Corriher M.D on 05/03/2019 at 1:34 PM  Between 7am to 6pm - Pager - [phone]  After 6pm go to [email or web address] - Scientist, research (life sciences)password [email or web address]  [employer] [facility] [place] [facility]  Office  [phone]  CC: Primary care physician; Whitaker, Jonnie FinnerJason Hestle, PA-C   Note: This dictation was prepared with Dragon dictation along with smaller phrase technology. Any transcriptional errors that result from this process are unintentional.

## 2019-05-03 NOTE — TOC Initial Note (Addendum)
Transition of Care [facility]) - Initial/Assessment Note    Patient Details  Name: Kerri [name] MRN: [id number] Date of Birth: 02-Oct-1931  [facility] CM/SW Contact:    Candie Chroman, LCSW Phone Number: 05/03/2019, 10:03 AM  Clinical Narrative: CSW met with patient, introduced role, and explained that PT recommendations would be discussed. Patient agreeable to home health PT and she also thinks an Therapist, sports and aide would be beneficial. She worked with [facility] several months ago. Patient will review CMS scores for local home health agencies. CSW asked her to pick top 3 preferences. Patient has a rolling walker and shower chair at home. PT recommending a 3-in-1 so she is agreeable to a bedside commode. CSW will follow up later today for agency preferences. No further concerns. CSW encouraged patient to contact CSW as needed. CSW will continue to follow patient for support and facilitate return home once medically stable.       11:31 am: [facility] chose [facility] as first preference home health agency because they have 4 1/2 out of 5 stars. Referral made and accepted.            Expected Discharge Plan: [place] Barriers to Discharge: Continued Medical Work up   Patient Goals and CMS Choice Patient states their goals for this hospitalization and ongoing recovery are:: Return home CMS Medicare.gov Compare Post Acute Care list provided to:: Patient    Expected Discharge Plan and Services Expected Discharge Plan: [place] In-house Referral: Clinical Social Work   Post Acute Care Choice: Durable Medical Equipment, Home Health Living arrangements for the past 2 months: Single Family Home                 DME Arranged: Bedside commode DME Agency: [employer]       HH Arranged: RN, PT, Nurse's Aide          Prior Living Arrangements/Services Living arrangements for the past 2 months: Single Family Home Lives with:: Self Patient  language and need for interpreter reviewed:: Yes Do you feel safe going back to the place where you live?: Yes      Need for Family Participation in Patient Care: Yes (Comment) Care giver support system in place?: Yes (comment) Current home services: DME Criminal Activity/Legal Involvement Pertinent to Current Situation/Hospitalization: No - Comment as needed  Activities of Daily Living Home Assistive Devices/Equipment: Cane (specify quad or straight), Walker (specify type) ADL Screening (condition at time of admission) Patient's cognitive ability adequate to safely complete daily activities?: No Is the patient deaf or have difficulty hearing?: No Does the patient have difficulty seeing, even when wearing glasses/contacts?: No Does the patient have difficulty concentrating, remembering, or making decisions?: Yes Patient able to express need for assistance with ADLs?: No Does the patient have difficulty dressing or bathing?: No Independently performs ADLs?: Yes (appropriate for developmental age) Does the patient have difficulty walking or climbing stairs?: Yes Weakness of Legs: Both Weakness of Arms/Hands: Both  Permission Sought/Granted Permission sought to share information with : Facility Art therapist granted to share information with : Yes, Verbal Permission Granted     Permission granted to share info w AGENCY: Home Health Agencies        Emotional Assessment Appearance:: Appears stated age Attitude/Demeanor/Rapport: Engaged, Gracious Affect (typically observed): Accepting, Appropriate, Calm, Pleasant Orientation: : Oriented to Self, Oriented to Place, Oriented to  Time, Oriented to Situation Alcohol / Substance Use: Never Used Psych  Involvement: No (comment)  Admission diagnosis:  Altered mental status, unspecified altered mental status type [R41.82] Sepsis, due to unspecified organism, unspecified whether acute organ dysfunction present [facility])  [A41.9] Patient Active Problem List   Diagnosis Date Noted  . Sepsis ([place]) 05/01/2019   PCP:  Donnamarie Rossetti, PA-C Pharmacy:   [id number] [id number]: [phone]: [phone] [place] [address]: [phone]: [phone]    Social Determinants of Health (SDOH) Interventions    Readmission Risk Interventions Readmission Risk Prevention Plan 05/01/2019  Medication Screening Complete  Transportation Screening Complete  Some recent data might be hidden

## 2019-05-03 NOTE — TOC Transition Note (Signed)
Transition of Care [facility]) - CM/SW Discharge Note   Patient Details  Name: Kerri [name] MRN: [id number] Date of Birth: [birth year]  [facility]) CM/SW Contact:  Candie Chroman, LCSW Phone Number: 05/03/2019, 2:05 PM   Clinical Narrative:  Patient has orders to discharge home today. [facility] health is set up with [facility] for PT, RN, and aide. Bedside commode has been ordered. Son is aware and will pick her up today. No further concerns. CSW signing off.   Final next level of care: [place] Barriers to Discharge: Barriers Resolved   Patient Goals and CMS Choice Patient states their goals for this hospitalization and ongoing recovery are:: Return home CMS Medicare.gov Compare Post Acute Care list provided to:: Patient Choice offered to / list presented to : Patient, Adult Children  Discharge Placement                Patient to be transferred to facility by: Son will take her home. Name of family member notified: [name] Patient and family notified of of transfer: 05/03/19  Discharge Plan and Services In-house Referral: Clinical Social Work   Post Acute Care Choice: Museum/gallery conservator, Home Health          DME Arranged: Bedside commode DME Agency: [employer] Date DME Agency Contacted: 05/03/19   Representative spoke with at DME Agency: [facility] HH Arranged: RN, PT, Nurse's Aide [place] Agency: [facility] Date [place]: 05/03/19   Representative spoke with at [place]: Adela Lank  Social Determinants of Health ([place]) Interventions     Readmission Risk Interventions Readmission Risk Prevention Plan 05/01/2019  Medication Screening Complete  Transportation Screening Complete  Some recent data might be hidden

## 2019-05-05 LAB — CULTURE, BLOOD (ROUTINE X 2)
Culture: NO GROWTH
Culture: NO GROWTH

## 2020-12-17 DIAGNOSIS — I5022 Chronic systolic (congestive) heart failure: Secondary | ICD-10-CM | POA: Insufficient documentation

## 2021-01-05 ENCOUNTER — Other Ambulatory Visit
Admission: RE | Admit: 2021-01-05 | Discharge: 2021-01-05 | Disposition: A | Discharge status: Home / Self Care | Payer: Medicare Other | Source: Ambulatory Visit | Attending: Cardiology | Admitting: Cardiology

## 2021-01-05 ENCOUNTER — Other Ambulatory Visit: Payer: Self-pay

## 2021-01-05 DIAGNOSIS — Z20822 Contact with and (suspected) exposure to covid-19: Secondary | ICD-10-CM | POA: Diagnosis not present

## 2021-01-05 DIAGNOSIS — Z01812 Encounter for preprocedural laboratory examination: Secondary | ICD-10-CM | POA: Insufficient documentation

## 2021-01-06 LAB — SARS CORONAVIRUS 2 (TAT 6-24 HRS): SARS Coronavirus 2: NEGATIVE

## 2021-01-07 ENCOUNTER — Encounter: Payer: Self-pay | Admitting: Cardiology

## 2021-01-07 ENCOUNTER — Other Ambulatory Visit: Payer: Self-pay

## 2021-01-07 ENCOUNTER — Observation Stay
Admission: RE | Admit: 2021-01-07 | Discharge: 2021-01-08 | Disposition: A | Discharge status: Home / Self Care | Payer: Medicare Other | Source: Ambulatory Visit | Attending: Cardiology | Admitting: Cardiology

## 2021-01-07 ENCOUNTER — Encounter
Admission: RE | Disposition: A | Discharge status: Home / Self Care | Payer: Self-pay | Source: Ambulatory Visit | Attending: Cardiology

## 2021-01-07 DIAGNOSIS — Z006 Encounter for examination for normal comparison and control in clinical research program: Secondary | ICD-10-CM | POA: Insufficient documentation

## 2021-01-07 DIAGNOSIS — I442 Atrioventricular block, complete: Principal | ICD-10-CM | POA: Diagnosis present

## 2021-01-07 DIAGNOSIS — Z45018 Encounter for adjustment and management of other part of cardiac pacemaker: Secondary | ICD-10-CM | POA: Diagnosis not present

## 2021-01-07 HISTORY — PX: PACEMAKER LEADLESS INSERTION: EP1219

## 2021-01-07 HISTORY — PX: PPM GENERATOR REMOVAL: EP1234

## 2021-01-07 SURGERY — PACEMAKER LEADLESS INSERTION
Anesthesia: Moderate Sedation

## 2021-01-07 MED ORDER — METOPROLOL SUCCINATE ER 50 MG PO TB24
50.0000 mg | ORAL_TABLET | Freq: Every day | ORAL | Status: DC
  Administered 2021-01-07 – 2021-01-08 (×2): 50 mg via ORAL
  Filled 2021-01-07 (×2): qty 1

## 2021-01-07 MED ORDER — FENTANYL CITRATE (PF) 100 MCG/2ML IJ SOLN
INTRAMUSCULAR | Status: DC | PRN
  Administered 2021-01-07: 25 ug via INTRAVENOUS
  Administered 2021-01-07: 50 ug via INTRAVENOUS

## 2021-01-07 MED ORDER — FENTANYL CITRATE (PF) 100 MCG/2ML IJ SOLN
INTRAMUSCULAR | Status: AC
  Filled 2021-01-07: qty 2

## 2021-01-07 MED ORDER — HEPARIN (PORCINE) IN NACL 1000-0.9 UT/500ML-% IV SOLN
INTRAVENOUS | Status: AC
  Filled 2021-01-07: qty 1000

## 2021-01-07 MED ORDER — LIDOCAINE HCL (PF) 1 % IJ SOLN
INTRAMUSCULAR | Status: AC
  Filled 2021-01-07: qty 30

## 2021-01-07 MED ORDER — SODIUM CHLORIDE 0.9 % IV SOLN: INTRAVENOUS | Status: DC

## 2021-01-07 MED ORDER — SODIUM CHLORIDE 0.9% FLUSH: 3.0000 mL | INTRAVENOUS | Status: DC | PRN

## 2021-01-07 MED ORDER — VENLAFAXINE HCL ER 75 MG PO CP24
75.0000 mg | ORAL_CAPSULE | Freq: Every day | ORAL | Status: DC
  Administered 2021-01-08: 75 mg via ORAL
  Filled 2021-01-07: qty 1

## 2021-01-07 MED ORDER — HEPARIN (PORCINE) IN NACL 1000-0.9 UT/500ML-% IV SOLN
INTRAVENOUS | Status: DC | PRN
  Administered 2021-01-07: 500 mL

## 2021-01-07 MED ORDER — MEMANTINE HCL 10 MG PO TABS
10.0000 mg | ORAL_TABLET | Freq: Two times a day (BID) | ORAL | Status: DC
  Administered 2021-01-07 – 2021-01-08 (×2): 10 mg via ORAL
  Filled 2021-01-07 (×3): qty 1

## 2021-01-07 MED ORDER — MIDAZOLAM HCL 2 MG/2ML IJ SOLN
INTRAMUSCULAR | Status: AC
  Filled 2021-01-07: qty 2

## 2021-01-07 MED ORDER — CALCIUM CARBONATE-VITAMIN D 500-200 MG-UNIT PO TABS
1.0000 | ORAL_TABLET | Freq: Every day | ORAL | Status: DC
  Administered 2021-01-08: 1 via ORAL
  Filled 2021-01-07: qty 1

## 2021-01-07 MED ORDER — SODIUM CHLORIDE 0.9 % IV SOLN
80.0000 mg | INTRAVENOUS | Status: DC
  Filled 2021-01-07: qty 2

## 2021-01-07 MED ORDER — FOLIC ACID 1 MG PO TABS
1.0000 mg | ORAL_TABLET | Freq: Every day | ORAL | Status: DC
  Administered 2021-01-08: 1 mg via ORAL
  Filled 2021-01-07: qty 1

## 2021-01-07 MED ORDER — MIDAZOLAM HCL 2 MG/2ML IJ SOLN
INTRAMUSCULAR | Status: DC | PRN
  Administered 2021-01-07: 1 mg via INTRAVENOUS

## 2021-01-07 MED ORDER — CEFAZOLIN SODIUM-DEXTROSE 2-4 GM/100ML-% IV SOLN: 2.0000 g | INTRAVENOUS | Status: DC

## 2021-01-07 MED ORDER — METOPROLOL TARTRATE 5 MG/5ML IV SOLN: 2.5000 mg | Freq: Once | INTRAVENOUS | Status: DC

## 2021-01-07 MED ORDER — HEPARIN SODIUM (PORCINE) 1000 UNIT/ML IJ SOLN
INTRAMUSCULAR | Status: AC
  Filled 2021-01-07: qty 1

## 2021-01-07 MED ORDER — LEVOTHYROXINE SODIUM 50 MCG PO TABS
50.0000 ug | ORAL_TABLET | Freq: Every day | ORAL | Status: DC
  Administered 2021-01-08: 50 ug via ORAL
  Filled 2021-01-07: qty 1

## 2021-01-07 MED ORDER — SODIUM CHLORIDE 0.9 % IV SOLN: INTRAVENOUS | Status: DC | PRN

## 2021-01-07 MED ORDER — SODIUM CHLORIDE 0.9 % IV SOLN: 250.0000 mL | INTRAVENOUS | Status: DC | PRN

## 2021-01-07 MED ORDER — HEPARIN (PORCINE) IN NACL 1000-0.9 UT/500ML-% IV SOLN
INTRAVENOUS | Status: DC | PRN
  Administered 2021-01-07: 1000 mL

## 2021-01-07 MED ORDER — HEPARIN SODIUM (PORCINE) 1000 UNIT/ML IJ SOLN
INTRAMUSCULAR | Status: DC | PRN
  Administered 2021-01-07: 3500 [IU] via INTRAVENOUS

## 2021-01-07 MED ORDER — ACETAMINOPHEN 325 MG PO TABS: 650.0000 mg | ORAL_TABLET | ORAL | Status: DC | PRN

## 2021-01-07 MED ORDER — LIDOCAINE HCL (PF) 1 % IJ SOLN
INTRAMUSCULAR | Status: DC | PRN
  Administered 2021-01-07: 15 mL

## 2021-01-07 MED ORDER — IOHEXOL 300 MG/ML  SOLN
INTRAMUSCULAR | Status: DC | PRN
  Administered 2021-01-07: 30 mL

## 2021-01-07 MED ORDER — ONDANSETRON HCL 4 MG/2ML IJ SOLN: 4.0000 mg | Freq: Four times a day (QID) | INTRAMUSCULAR | Status: DC | PRN

## 2021-01-07 MED ORDER — CEFAZOLIN SODIUM-DEXTROSE 2-4 GM/100ML-% IV SOLN
INTRAVENOUS | Status: AC
  Filled 2021-01-07: qty 100

## 2021-01-07 MED ORDER — SODIUM CHLORIDE 0.9% FLUSH
3.0000 mL | Freq: Two times a day (BID) | INTRAVENOUS | Status: DC
  Administered 2021-01-07: 3 mL via INTRAVENOUS

## 2021-01-07 MED ORDER — DONEPEZIL HCL 5 MG PO TABS
10.0000 mg | ORAL_TABLET | Freq: Every day | ORAL | Status: DC
  Administered 2021-01-07: 10 mg via ORAL
  Filled 2021-01-07 (×2): qty 2

## 2021-01-07 MED ORDER — CEPHALEXIN 500 MG PO CAPS
500.0000 mg | ORAL_CAPSULE | Freq: Two times a day (BID) | ORAL | Status: DC
  Administered 2021-01-07 – 2021-01-08 (×3): 500 mg via ORAL
  Filled 2021-01-07 (×3): qty 1

## 2021-01-07 SURGICAL SUPPLY — 32 items
CATH INFINITI JR4 5F (CATHETERS) ×2 IMPLANT
COVER SURGICAL LIGHT HANDLE (MISCELLANEOUS) IMPLANT
DEVICE DSSCT PLSMBLD 3.0S LGHT (MISCELLANEOUS) ×1 IMPLANT
DILATOR VESSEL 38 20CM 12FR (INTRODUCER) ×2 IMPLANT
DILATOR VESSEL 38 20CM 14FR (INTRODUCER) ×2 IMPLANT
DILATOR VESSEL 38 20CM 18FR (INTRODUCER) ×2 IMPLANT
DILATOR VESSEL 38 20CM 8FR (INTRODUCER) ×2 IMPLANT
GUIDEWIRE .025 260CM (WIRE) ×2 IMPLANT
INTRO PACEMAKR LEAD 7FR 23CM (INTRODUCER)
INTRODUCER PACEMKR LD 7FR 23CM (INTRODUCER) IMPLANT
KIT LEAD END CAP (Cap) ×2 IMPLANT
KIT WRENCH (KITS) ×2 IMPLANT
LEAD END CAP (Cap) ×2 IMPLANT
MICRA AV TRANSCATH PACING SYS (Pacemaker) ×2 IMPLANT
MICRA INTRODUCER SHEATH (SHEATH) ×2
NEEDLE PERC 18GX7CM (NEEDLE) ×2 IMPLANT
PACK CARDIAC CATH (CUSTOM PROCEDURE TRAY) ×2 IMPLANT
PAD ELECT DEFIB RADIOL ZOLL (MISCELLANEOUS) ×2 IMPLANT
PLASMABLADE 3.0S W/LIGHT (MISCELLANEOUS) ×2
SHEATH AVANTI 7FRX11 (SHEATH) ×2 IMPLANT
SHEATH INTRODUCER MICRA (SHEATH) ×1 IMPLANT
SUT SILK 0 FSL (SUTURE) ×2 IMPLANT
SUT SILK 0 SH 30 (SUTURE) ×4 IMPLANT
SUT VIC AB 2-0 CT1 27 (SUTURE) ×1
SUT VIC AB 2-0 CT1 TAPERPNT 27 (SUTURE) ×1 IMPLANT
SUT VICRYL 4-0  27 PS-2 BARIAT (SUTURE) ×1
SUT VICRYL 4-0 27 PS-2 BARIAT (SUTURE) ×1
SUTURE VICRYL 4-0 27 PS-2 BART (SUTURE) ×1 IMPLANT
SYSTEM PACING TRNSCTH AV MICRA (Pacemaker) ×1 IMPLANT
TRAY PACEMAKER INSERTION (PACKS) ×2 IMPLANT
WIRE AMPLATZ SS-J .035X180CM (WIRE) ×4 IMPLANT
WIRE GUIDERIGHT .035X150 (WIRE) ×2 IMPLANT

## 2021-01-08 DIAGNOSIS — I442 Atrioventricular block, complete: Secondary | ICD-10-CM | POA: Diagnosis not present

## 2021-01-08 LAB — BASIC METABOLIC PANEL
Anion gap: 8 (ref 5–15)
BUN: 20 mg/dL (ref 8–23)
CO2: 26 mmol/L (ref 22–32)
Calcium: 8.8 mg/dL — ABNORMAL LOW (ref 8.9–10.3)
Chloride: 106 mmol/L (ref 98–111)
Creatinine, Ser: 1.28 mg/dL — ABNORMAL HIGH (ref 0.44–1.00)
GFR, Estimated: 41 mL/min — ABNORMAL LOW (ref >60)
Glucose, Bld: 144 mg/dL — ABNORMAL HIGH (ref 70–99)
Potassium: 4.1 mmol/L (ref 3.5–5.1)
Sodium: 140 mmol/L (ref 135–145)

## 2021-01-08 MED ORDER — CEPHALEXIN 500 MG PO CAPS: 500.0000 mg | ORAL_CAPSULE | Freq: Two times a day (BID) | ORAL | 0 refills | Status: DC

## 2021-01-08 MED ORDER — METOPROLOL SUCCINATE ER 50 MG PO TB24: 50.0000 mg | ORAL_TABLET | Freq: Every day | ORAL | 5 refills | Status: DC

## 2021-01-08 NOTE — Progress Notes (Signed)
Discharge instructions given to patient and son. Verbalized understanding. No acute distress at this time. Son to transport patient home.

## 2021-01-08 NOTE — Discharge Instructions (Signed)
Patient may shower 01/10/2021.  Leave Steri-Strips on.

## 2021-01-08 NOTE — Discharge Summary (Signed)
Physician Discharge Summary  Patient ID: Kerri [name] MRN: [id number] DOB/AGE: Dec 20, 1930 87 y.o.  Admit date: 01/07/2021 Discharge date: 01/08/2021  Primary Discharge Diagnosis complete heart block Secondary Discharge Diagnosis elective replacement indication  Significant Diagnostic Studies: yes  Consults: None  Hospital Course: Patient was brought to the cardiac catheterization laboratory on 01/07/2021 for elective Micra AV leadless pacemaker implantation and explant of existing permanent pacemaker.  Micra AV leadless pacemaker was implanted successfully via left femoral vein approach.  The existing dual-chamber pacemaker left pectoral region was explanted.  The patient was observed overnight on telemetry without incident.  On the morning of 01/08/2021 the patient was ambulating without difficulty and was discharged home.  Discharge Exam: Blood pressure (!) 111/54, pulse (!) 50, temperature 98.5 F (36.9 C), temperature source Oral, resp. rate 18, height 5\' 8"  (1.727 m), weight 77.4 kg, SpO2 96 %.   General appearance: alert Head: Normocephalic, without obvious abnormality, atraumatic Eyes: conjunctivae/corneas clear. PERRL, EOM's intact. Fundi benign. Ears: normal TM's and external ear canals both ears Nose: Nares normal. Septum midline. Mucosa normal. No drainage or sinus tenderness. Throat: lips, mucosa, and tongue normal; teeth and gums normal Neck: no adenopathy, no carotid bruit, no JVD, supple, symmetrical, trachea midline and thyroid not enlarged, symmetric, no tenderness/mass/nodules Back: symmetric, no curvature. ROM normal. No CVA tenderness. Resp: clear to auscultation bilaterally Chest wall: no tenderness Cardio: regular rate and rhythm, S1, S2 normal, no murmur, click, rub or gallop GI: soft, non-tender; bowel sounds normal; no masses,  no organomegaly Extremities: extremities normal, atraumatic, no cyanosis or edema Pulses: 2+ and symmetric Skin: Skin color,  texture, turgor normal. No rashes or lesions Lymph nodes: Cervical, supraclavicular, and axillary nodes normal. Neurologic: Grossly normal Incision/Wound: Well-healing Labs:   Lab Results  Component Value Date   WBC 9.6 05/02/2019   HGB 12.8 05/02/2019   HCT 39.3 05/02/2019   MCV 87.3 05/02/2019   PLT 144 (L) 05/02/2019    Recent Labs  Lab 01/08/21 0449  NA 140  K 4.1  CL 106  CO2 26  BUN 20  CREATININE 1.28*  CALCIUM 8.8*  GLUCOSE 144*      Radiology:  EKG: A sensed V pace  FOLLOW UP PLANS AND APPOINTMENTS  Allergies as of 01/08/2021      Reactions   Bupropion Other (See Comments)   Tremors    Rofecoxib Diarrhea   Alendronate Rash      Medication List    STOP taking these medications   acetaminophen 325 MG tablet Commonly known as: TYLENOL   diclofenac sodium 1 % Gel Commonly known as: VOLTAREN   naproxen sodium 220 MG tablet Commonly known as: ALEVE     TAKE these medications   cephALEXin 500 MG capsule Commonly known as: KEFLEX Take 1 capsule (500 mg total) by mouth 2 (two) times daily.   donepezil 10 MG tablet Commonly known as: ARICEPT Take 10 mg by mouth at bedtime.   fluticasone 50 MCG/ACT nasal spray Commonly known as: FLONASE Place 2 sprays into both nostrils daily.   folic acid 1 MG tablet Commonly known as: FOLVITE Take 1 mg by mouth daily.   levothyroxine 50 MCG tablet Commonly known as: SYNTHROID Take 50 mcg by mouth daily before breakfast.   memantine 10 MG tablet Commonly known as: NAMENDA Take 10 mg by mouth 2 (two) times daily.   metoprolol succinate 50 MG 24 hr tablet Commonly known as: TOPROL-XL Take 1 tablet (50 mg total) by mouth daily.  Take with or immediately following a meal.   omega-3 acid ethyl esters 1 g capsule Commonly known as: LOVAZA Take 1 g by mouth daily.   raloxifene 60 MG tablet Commonly known as: EVISTA TAKE ONE TABLET EVERY DAY   tolterodine 4 MG 24 hr capsule Commonly known as: DETROL  LA Take 4 mg by mouth daily.   venlafaxine XR 75 MG 24 hr capsule Commonly known as: EFFEXOR-XR Take 75 mg by mouth daily with breakfast.   Vitamin D (Ergocalciferol) 1.25 MG (50000 UNIT) Caps capsule Commonly known as: DRISDOL Take 50,000 Units by mouth every 7 (seven) days.       Follow-up Information    Lamar Blinks, MD Follow up in 1 week(s).   Specialty: Cardiology Contact information: [address] [facility] [place] [address] [phone]               BRING ALL MEDICATIONS WITH YOU TO FOLLOW UP APPOINTMENTS  Time spent with patient to include physician time: 25 minutes Signed:  Marcina Millard MD, PhD, [facility] 01/08/2021, 7:58 AM

## 2021-01-13 DIAGNOSIS — Z950 Presence of cardiac pacemaker: Secondary | ICD-10-CM | POA: Insufficient documentation

## 2021-01-13 DIAGNOSIS — I340 Nonrheumatic mitral (valve) insufficiency: Secondary | ICD-10-CM | POA: Insufficient documentation

## 2021-08-15 ENCOUNTER — Other Ambulatory Visit: Payer: Self-pay

## 2021-08-15 ENCOUNTER — Emergency Department: Payer: Medicare Other

## 2021-08-15 ENCOUNTER — Emergency Department
Admission: EM | Admit: 2021-08-15 | Discharge: 2021-08-16 | Disposition: A | Discharge status: Home / Self Care | Payer: Medicare Other | Attending: Emergency Medicine | Admitting: Emergency Medicine

## 2021-08-15 DIAGNOSIS — M6281 Muscle weakness (generalized): Secondary | ICD-10-CM | POA: Diagnosis not present

## 2021-08-15 DIAGNOSIS — S022XXA Fracture of nasal bones, initial encounter for closed fracture: Secondary | ICD-10-CM | POA: Diagnosis not present

## 2021-08-15 DIAGNOSIS — F0390 Unspecified dementia without behavioral disturbance: Secondary | ICD-10-CM | POA: Insufficient documentation

## 2021-08-15 DIAGNOSIS — Z79899 Other long term (current) drug therapy: Secondary | ICD-10-CM | POA: Diagnosis not present

## 2021-08-15 DIAGNOSIS — I5022 Chronic systolic (congestive) heart failure: Secondary | ICD-10-CM | POA: Diagnosis not present

## 2021-08-15 DIAGNOSIS — Z20822 Contact with and (suspected) exposure to covid-19: Secondary | ICD-10-CM | POA: Insufficient documentation

## 2021-08-15 DIAGNOSIS — I130 Hypertensive heart and chronic kidney disease with heart failure and stage 1 through stage 4 chronic kidney disease, or unspecified chronic kidney disease: Secondary | ICD-10-CM | POA: Insufficient documentation

## 2021-08-15 DIAGNOSIS — E039 Hypothyroidism, unspecified: Secondary | ICD-10-CM | POA: Diagnosis not present

## 2021-08-15 DIAGNOSIS — F39 Unspecified mood [affective] disorder: Secondary | ICD-10-CM | POA: Diagnosis not present

## 2021-08-15 DIAGNOSIS — R2681 Unsteadiness on feet: Secondary | ICD-10-CM | POA: Insufficient documentation

## 2021-08-15 DIAGNOSIS — W06XXXA Fall from bed, initial encounter: Secondary | ICD-10-CM | POA: Insufficient documentation

## 2021-08-15 DIAGNOSIS — Z950 Presence of cardiac pacemaker: Secondary | ICD-10-CM | POA: Diagnosis not present

## 2021-08-15 DIAGNOSIS — N189 Chronic kidney disease, unspecified: Secondary | ICD-10-CM | POA: Insufficient documentation

## 2021-08-15 DIAGNOSIS — W19XXXA Unspecified fall, initial encounter: Secondary | ICD-10-CM

## 2021-08-15 DIAGNOSIS — S0992XA Unspecified injury of nose, initial encounter: Secondary | ICD-10-CM | POA: Diagnosis present

## 2021-08-15 LAB — CBC WITH DIFFERENTIAL/PLATELET
Abs Immature Granulocytes: 0.04 10*3/uL (ref 0.00–0.07)
Basophils Absolute: 0.1 10*3/uL (ref 0.0–0.1)
Basophils Relative: 0 %
Eosinophils Absolute: 0.1 10*3/uL (ref 0.0–0.5)
Eosinophils Relative: 1 %
HCT: 48.4 % — ABNORMAL HIGH (ref 36.0–46.0)
Hemoglobin: 15.5 g/dL — ABNORMAL HIGH (ref 12.0–15.0)
Immature Granulocytes: 0 %
Lymphocytes Relative: 26 %
Lymphs Abs: 3 10*3/uL (ref 0.7–4.0)
MCH: 27.9 pg (ref 26.0–34.0)
MCHC: 32 g/dL (ref 30.0–36.0)
MCV: 87.2 fL (ref 80.0–100.0)
Monocytes Absolute: 0.7 10*3/uL (ref 0.1–1.0)
Monocytes Relative: 6 %
Neutro Abs: 7.8 10*3/uL — ABNORMAL HIGH (ref 1.7–7.7)
Neutrophils Relative %: 67 %
Platelets: 330 10*3/uL (ref 150–400)
RBC: 5.55 MIL/uL — ABNORMAL HIGH (ref 3.87–5.11)
RDW: 13.4 % (ref 11.5–15.5)
WBC: 11.7 10*3/uL — ABNORMAL HIGH (ref 4.0–10.5)
nRBC: 0 % (ref 0.0–0.2)

## 2021-08-15 LAB — BASIC METABOLIC PANEL
Anion gap: 8 (ref 5–15)
BUN: 13 mg/dL (ref 8–23)
CO2: 26 mmol/L (ref 22–32)
Calcium: 8.9 mg/dL (ref 8.9–10.3)
Chloride: 106 mmol/L (ref 98–111)
Creatinine, Ser: 1.15 mg/dL — ABNORMAL HIGH (ref 0.44–1.00)
GFR, Estimated: 46 mL/min — ABNORMAL LOW (ref >60)
Glucose, Bld: 101 mg/dL — ABNORMAL HIGH (ref 70–99)
Potassium: 4.8 mmol/L (ref 3.5–5.1)
Sodium: 140 mmol/L (ref 135–145)

## 2021-08-15 LAB — URINALYSIS, COMPLETE (UACMP) WITH MICROSCOPIC
Bacteria, UA: NONE SEEN
Bilirubin Urine: NEGATIVE
Glucose, UA: NEGATIVE mg/dL
Ketones, ur: NEGATIVE mg/dL
Leukocytes,Ua: NEGATIVE
Nitrite: NEGATIVE
Protein, ur: NEGATIVE mg/dL
Specific Gravity, Urine: 1.009 (ref 1.005–1.030)
pH: 6 (ref 5.0–8.0)

## 2021-08-15 LAB — RESP PANEL BY RT-PCR (FLU A&B, COVID) ARPGX2
Influenza A by PCR: NEGATIVE
Influenza B by PCR: NEGATIVE
SARS Coronavirus 2 by RT PCR: NEGATIVE

## 2021-08-15 LAB — TROPONIN I (HIGH SENSITIVITY)
Troponin I (High Sensitivity): 9 ng/L (ref <18)
Troponin I (High Sensitivity): 9 ng/L (ref <18)

## 2021-08-15 LAB — MAGNESIUM: Magnesium: 2.3 mg/dL (ref 1.7–2.4)

## 2021-08-15 MED ORDER — LEVOTHYROXINE SODIUM 50 MCG PO TABS: 50.0000 ug | ORAL_TABLET | Freq: Every day | ORAL | Status: DC

## 2021-08-15 MED ORDER — DONEPEZIL HCL 5 MG PO TABS
10.0000 mg | ORAL_TABLET | Freq: Every day | ORAL | Status: DC
  Administered 2021-08-15: 10 mg via ORAL
  Filled 2021-08-15: qty 2

## 2021-08-15 MED ORDER — FESOTERODINE FUMARATE ER 4 MG PO TB24
4.0000 mg | ORAL_TABLET | Freq: Every day | ORAL | Status: DC
  Administered 2021-08-15: 4 mg via ORAL
  Filled 2021-08-15 (×3): qty 1

## 2021-08-15 MED ORDER — VITAMIN D (ERGOCALCIFEROL) 1.25 MG (50000 UNIT) PO CAPS: 50000.0000 [IU] | ORAL_CAPSULE | ORAL | Status: DC

## 2021-08-15 MED ORDER — FOLIC ACID 1 MG PO TABS
1.0000 mg | ORAL_TABLET | Freq: Every day | ORAL | Status: DC
  Administered 2021-08-15: 1 mg via ORAL
  Filled 2021-08-15: qty 1

## 2021-08-15 MED ORDER — RALOXIFENE HCL 60 MG PO TABS
60.0000 mg | ORAL_TABLET | Freq: Every day | ORAL | Status: DC
  Filled 2021-08-15 (×3): qty 1

## 2021-08-15 MED ORDER — METOPROLOL SUCCINATE ER 50 MG PO TB24
50.0000 mg | ORAL_TABLET | Freq: Every day | ORAL | Status: DC
  Administered 2021-08-15: 50 mg via ORAL
  Filled 2021-08-15: qty 1

## 2021-08-15 MED ORDER — LEVOTHYROXINE SODIUM 50 MCG PO TABS
50.0000 ug | ORAL_TABLET | Freq: Every day | ORAL | Status: DC
Start: 2021-08-16 — End: 2021-08-17
  Administered 2021-08-16: 50 ug via ORAL
  Filled 2021-08-15: qty 1

## 2021-08-15 MED ORDER — MEMANTINE HCL 5 MG PO TABS: 10.0000 mg | ORAL_TABLET | Freq: Two times a day (BID) | ORAL | Status: DC

## 2021-08-15 MED ORDER — OMEGA-3-ACID ETHYL ESTERS 1 G PO CAPS
1.0000 g | ORAL_CAPSULE | Freq: Every day | ORAL | Status: DC
Start: 2021-08-15 — End: 2021-08-17
  Filled 2021-08-15 (×2): qty 1

## 2021-08-15 MED ORDER — VENLAFAXINE HCL ER 75 MG PO CP24
75.0000 mg | ORAL_CAPSULE | Freq: Every day | ORAL | Status: DC
  Filled 2021-08-15 (×2): qty 1

## 2021-08-15 NOTE — ED Triage Notes (Signed)
Pt in via EMS from home with c/o fall. Pt was getting out of bed and fell. Pt family found her this am with dried blood on her nose. 146/78, HR 78, 98% RA, hx of alzheimers and pacemaker  Pt son Bich Mchaney [phone], please call when she gets in a room. She has also not taken her meds. If information is needed please call

## 2021-08-15 NOTE — ED Notes (Signed)
Report given to Katie RN.

## 2021-08-15 NOTE — ED Notes (Signed)
Patient was given the phone to speak with daughter.

## 2021-08-15 NOTE — ED Triage Notes (Signed)
Bruising noted on bridge of nose and nose appears swollen. Dried blood noted on face around nose. No active bleeding at this time. Pt alert and oriented to self, hx of alzheimers. Pt denies taking any blood thinners

## 2021-08-15 NOTE — Evaluation (Signed)
Occupational Therapy Evaluation Patient Details Name: Kerri [name] MRN: [id number] DOB: 1931/05/14 Today's Date: 08/15/2021   History of Present Illness Pt is a 87 y/o F with PMH: HLD, HTN, hypothyroidism, CKD, dementia, and osteoporosis who presented via EMS from home with c/o fall. 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   Clinical Impression   Pt seen for OT evaluation this date in setting of acute hospitalization d/t fall from bed (pt is unsure of details, woke up next to her bed with bruises per her reports). Pt reports living alone at baseline and using cane for fxl mobility. She states she is able to do all basic self care, but sometimes has assist for bathing from her daughter or DIL. She is able to perform light meal prep and reports she is able to clean her home. Pt present this date with decreased balance and functional activity tolerance. She requires CGA/SBA for transfers and mobility with RW for balance (she benefits from bilateral support, her son states she refuses walker at home and only wants to use cane). Pt with slightly decreased dynamic sitting balance requiring MIN A to don socks, but is otherwise able to perform LB ADLs with SBA for balance/safety. She is able to walk ~40' in hallway to simulate [facility] distances with RW, but requires 2 standing rest breaks, urged by OT d/t appearing to fatigue (aeb flexing knees). Pt returned to stretcher end of session with all needs met and in reach. Will continue to follow acutely. Anticipate pt could benefit from [facility] f/u to improve safety and INDEP with self care and IADLs.      Recommendations for follow up therapy are one component of a multi-disciplinary discharge planning process, led by the attending physician.   Recommendations may be updated based on patient status, additional functional criteria and insurance authorization.   Follow Up Recommendations  Home health OT    Assistance Recommended at Discharge Intermittent Supervision/Assistance  Functional Status Assessment  Patient has had a recent decline in their functional status and demonstrates the ability to make significant improvements in function in a reasonable and predictable amount of time.  Equipment Recommendations  None recommended by OT (pt with all necearry equipment, would potentially benefit from some aide help on weekends)    Recommendations for Other Services       Precautions / Restrictions Precautions Precautions: Fall Restrictions Weight Bearing Restrictions: No      Mobility Bed Mobility Overal bed mobility: Modified Independent             General bed mobility comments: HOB slightly elevated, increased time    Transfers Overall transfer level: Needs assistance Equipment used: Rolling walker (2 wheels) Transfers: Sit to/from Stand Sit to Stand: Supervision;Min guard           General transfer comment: CGA with progress to SUPV.      Balance Overall balance assessment: Needs assistance   Sitting balance-Leahy Scale: Good       Standing balance-Leahy Scale: Fair Standing balance comment: requires B UE support for safety (only uses unilateral support with cane at home)                           ADL either performed or assessed with clinical judgement   ADL  General ADL Comments: SETUP for seated UB ADLs. SUPV for most LB ADLs, MIN A to put socks on. CGA/SBA for fxl mobility with RW.     Vision Patient Visual Report: No change from baseline       Perception     Praxis      Pertinent Vitals/Pain Pain Assessment: No/denies pain     Hand Dominance Right   Extremity/Trunk Assessment Upper Extremity  Assessment Upper Extremity Assessment: Overall WFL for tasks assessed;Generalized weakness (ROM WFL, MMT grossly 4-/5, unable to tolerate much hands on resistance.)   Lower Extremity Assessment Lower Extremity Assessment: Overall WFL for tasks assessed;Generalized weakness       Communication Communication Communication: No difficulties   Cognition Arousal/Alertness: Awake/alert Behavior During Therapy: WFL for tasks assessed/performed Overall Cognitive Status: Within Functional Limits for tasks assessed                                 General Comments: Pt oriented to self, place, situation, but not time, states the month is June/July. Pt is able to follow all simple commands.     General Comments       Exercises Other Exercises Other Exercises: OT engages pt in ed re: role of OT in acute setting, importance of OOB activity, safe sequence/use of RW, fall prevention considerations. Pt with moderate reception, will require ongoing education.   Shoulder Instructions      Home Living Family/patient expects to be discharged to:: Private residence Living Arrangements: Alone Available Help at Discharge: Family;Available PRN/intermittently (son/DIL) Type of Home: House Home Access: Stairs to enter [employer] of Steps: 3 to front, garage is flat entrance Entrance Stairs-Rails: Can reach both Home Layout: One level;Laundry or work area in basement;Able to live on main level with bedroom/bathroom Alternate Therapist, sports of Steps: 12 Alternate Level Stairs-Rails: Can reach both Bathroom Shower/Tub: Chief Strategy Officer: Shower seat - built Medical sales representative (2 wheels);Cane - single point;Grab bars - tub/shower;BSC   Additional Comments: son, via telephone, reports that pt's DIL is with her on and off from 9-7 during week days, Pt's daughter assisted on weekends but was just recently released from the hospital herself.      Prior  Functioning/Environment Prior Level of Function : Independent/Modified Independent             Mobility Comments: MOD I with cane for fxl mobility, denies fall history outside of this hospitalization but is poor historian. Her son reports they encourage her to use RW instead. ADLs Comments: perfoms basic self care mostly I'ly, DIL or daughter have been helping some with bathing. Pt does her own light meal prep and cleaning. Son/DIL/DTR alteranate driving/getting groceries for pt.        OT Problem List: Decreased strength;Decreased activity tolerance;Impaired balance (sitting and/or standing);Decreased knowledge of use of DME or AE      OT Treatment/Interventions: Self-care/ADL training;Therapeutic exercise;DME and/or AE instruction;Therapeutic activities;Balance training;Patient/family education    OT Goals(Current goals can be found in the care plan section) Acute Rehab OT Goals Patient Stated Goal: to go home OT Goal Formulation: With patient Time For Goal Achievement: 08/29/21 Potential to Achieve Goals: Good ADL Goals Pt Will Transfer to Toilet: with modified independence;ambulating Pt Will Perform Toileting - Clothing Manipulation and hygiene: with modified independence;sit to/from stand  OT Frequency: Min 1X/week   Barriers to D/C: Decreased caregiver support  pt's  daughter who was assisting, has recently been hospitalized, her son and DIL are still able to help, but this puts a strain on them       Co-evaluation              AM-PAC OT "6 Clicks" Daily Activity     Outcome Measure Help from another person eating meals?: None Help from another person taking care of personal grooming?: A Little Help from another person toileting, which includes using toliet, bedpan, or urinal?: A Little Help from another person bathing (including washing, rinsing, drying)?: A Little Help from another person to put on and taking off regular upper body clothing?: None Help from  another person to put on and taking off regular lower body clothing?: A Little 6 Click Score: 20   End of Session Equipment Utilized During Treatment: Gait belt;Rolling walker (2 wheels) Nurse Communication: Mobility status  Activity Tolerance: Patient tolerated treatment well Patient left: in bed;with call bell/phone within reach  OT Visit Diagnosis: Unsteadiness on feet (R26.81);Muscle weakness (generalized) (M62.81)                Time: 8280-0349 OT Time Calculation (min): 47 min Charges:  OT General Charges $OT Visit: 1 Visit OT Evaluation $OT Eval Moderate Complexity: 1 Mod OT Treatments $Self Care/Home Management : 8-22 mins $Therapeutic Activity: 8-22 mins  Gerrianne Scale, MS, OTR/L ascom [phone] 08/15/21, 5:43 PM

## 2021-08-15 NOTE — ED Provider Notes (Addendum)
[facility] Emergency Department Provider Note  ____________________________________________   Event Date/Time   First MD Initiated Contact with Patient 08/15/21 1304     (approximate)  I have reviewed the triage vital signs and the nursing notes.   HISTORY  Chief Complaint Fall   HPI Kerri [name] is a 87 y.o. female with a past medical history of HDL, HTN, osteoporosis, anxiety, dementia, CKD, hypothyroidism, complete heart block status post pacemaker placement, chronic systolic CHF and moderate mitral regurgitation who presents via EMS from home where she lives alone after a fall.  Patient states she does not remember exactly what happened other than waking up next to her bed on the floor.  She thinks she may have rolled out of bed although is not sure.  She is complaining of mild headache and notes she had some bleeding from her nose.  She is not oriented to year and unable provide any additional history secondary to some baseline dementia which I confirmed with patient's son over the phone and also shortly brought to bedside.  It seems that while patient does live alone and is not typically oriented she has family checking on her daily to help her with her medications.  No other recent falls although they note she recently finished a course of Bactrim for UTI.  No other sick symptoms per family.  No other additional history is available on patient presentation.           Past Medical History:  Diagnosis Date   Arthritis     Patient Active Problem List   Diagnosis Date Noted   CHB (complete heart block) (HCC) 01/07/2021   Sepsis (HCC) 05/01/2019    Past Surgical History:  Procedure Laterality Date   PACEMAKER LEADLESS INSERTION N/A 01/07/2021   Procedure: PACEMAKER LEADLESS INSERTION;  Surgeon: Marcina Millard, MD;  Location: [facility] INVASIVE CV LAB;  Service: Cardiovascular;  Laterality: N/A;   PPM GENERATOR REMOVAL N/A 01/07/2021   Procedure:  PPM GENERATOR REMOVAL;  Surgeon: Marcina Millard, MD;  Location: [facility] INVASIVE CV LAB;  Service: Cardiovascular;  Laterality: N/A;    Prior to Admission medications   Medication Sig Start Date End Date Taking? Authorizing Provider  donepezil (ARICEPT) 10 MG tablet Take 10 mg by mouth at bedtime.    [provider]  fluticasone (FLONASE) 50 MCG/ACT nasal spray Place 2 sprays into both nostrils daily. 05/03/19 06/02/19  Jama Flavors, MD  folic acid (FOLVITE) 1 MG tablet Take 1 mg by mouth daily.    [provider]  levothyroxine (SYNTHROID) 50 MCG tablet Take 50 mcg by mouth daily before breakfast. 11/17/18   [provider]  memantine (NAMENDA) 10 MG tablet Take 10 mg by mouth 2 (two) times daily. 02/19/19   [provider]  metoprolol succinate (TOPROL-XL) 50 MG 24 hr tablet Take 1 tablet (50 mg total) by mouth daily. Take with or immediately following a meal. 01/08/21   Paraschos, Alexander, MD  omega-3 acid ethyl esters (LOVAZA) 1 g capsule Take 1 g by mouth daily.  04/30/19   [provider]  raloxifene (EVISTA) 60 MG tablet TAKE ONE TABLET EVERY DAY 06/28/18   [provider]  tolterodine (DETROL LA) 4 MG 24 hr capsule Take 4 mg by mouth daily.    [provider]  venlafaxine XR (EFFEXOR-XR) 75 MG 24 hr capsule Take 75 mg by mouth daily with breakfast.  03/09/19   [provider]  Vitamin D, Ergocalciferol, (DRISDOL) 1.25 MG (  50000 UNIT) CAPS capsule Take 50,000 Units by mouth every 7 (seven) days.    [provider]    Allergies Bupropion, Rofecoxib, and Alendronate  History reviewed. No pertinent family history.  Social History Social History   Tobacco Use   Smoking status: Never   Smokeless tobacco: Never  Substance Use Topics   Alcohol use: Never   Drug use: Never    Review of Systems  Review of Systems  Unable to perform ROS: Dementia  Neurological:  Positive for headaches.      ____________________________________________   PHYSICAL EXAM:  VITAL SIGNS: ED Triage Vitals  Enc Vitals Group     BP 08/15/21 0958 135/70     Pulse Rate 08/15/21 0958 61     Resp 08/15/21 0958 18     Temp 08/15/21 0958 97.6 F (36.4 C)     Temp Source 08/15/21 0958 Oral     SpO2 08/15/21 0958 96 %     Weight 08/15/21 0958 150 lb (68 kg)     Height 08/15/21 0958 5\' 8"  (1.727 m)     Head Circumference --      Peak Flow --      Pain Score 08/15/21 1005 5     Pain Loc --      Pain Edu? --      Excl. in GC? --    Vitals:   08/15/21 0958  BP: 135/70  Pulse: 61  Resp: 18  Temp: 97.6 F (36.4 C)  SpO2: 96%   Physical Exam Vitals and nursing note reviewed.  Constitutional:      General: She is not in acute distress.    Appearance: She is well-developed.  HENT:     Head: Normocephalic.     Right Ear: External ear normal.     Left Ear: External ear normal.  Eyes:     Conjunctiva/sclera: Conjunctivae normal.  Cardiovascular:     Rate and Rhythm: Normal rate and regular rhythm.     Heart sounds: No murmur heard. Pulmonary:     Effort: Pulmonary effort is normal. No respiratory distress.     Breath sounds: Normal breath sounds.  Abdominal:     Palpations: Abdomen is soft.     Tenderness: There is no abdominal tenderness.  Musculoskeletal:     Cervical back: Neck supple.  Skin:    General: Skin is warm and dry.  Neurological:     Mental Status: She is alert. Mental status is at baseline. She is disoriented.  Psychiatric:        Mood and Affect: Mood normal.    No tenderness step-offs or deformities over the C/T/L-spine.  Patient has full strength and range of motion throughout her bilateral upper and lower extremities.  2+ radial pulses.  Sensation intact light touch all extremities.  There is no tenderness effusion deformity at the bilateral shoulders, elbows, wrists, hips, knees or ankles.  Abdomen is soft nontender throughout.  Chest is  unremarkable.  Patient has some bruising over her nose and dried blood in both nares.  Cranial nerves II through XII are otherwise grossly intact and there is no evidence of oropharyngeal trauma.  Remainder of scalp is unremarkable. ____________________________________________   LABS (all labs ordered are listed, but only abnormal results are displayed)  Labs Reviewed  CBC WITH DIFFERENTIAL/PLATELET - Abnormal; Notable for the following components:      Result Value   WBC 11.7 (*)    RBC 5.55 (*)    Hemoglobin 15.5 (*)  HCT 48.4 (*)    Neutro Abs 7.8 (*)    All other components within normal limits  BASIC METABOLIC PANEL - Abnormal; Notable for the following components:   Glucose, Bld 101 (*)    Creatinine, Ser 1.15 (*)    GFR, Estimated 46 (*)    All other components within normal limits  MAGNESIUM  URINALYSIS, COMPLETE (UACMP) WITH MICROSCOPIC  TROPONIN I (HIGH SENSITIVITY)   ____________________________________________  EKG  ECG remarkable for complete heart block with a paced rhythm at 60 bpm with otherwise QRS slightly wide at 162.  Some nonspecific ST changes in anterior leads without other clearance of acute ischemia or significant arrhythmia. ____________________________________________  RADIOLOGY  ED MD interpretation: CT head shows advanced atrophy with progressive small vessel ischemic disease and progressive hydrocephalus likely due to NPH.  There is a mildly displaced nasal bone fracture and some blood in the right maxillary sinus without other facial fractures noted.  No evidence of acute C-spine fracture or traumatic listhesis.  Chest x-ray has no evidence of effusion, edema, pneumothorax, rib fracture or any other acute thoracic process.  Official radiology report(s): DG Chest 2 View  Result Date: 08/15/2021 CLINICAL DATA:  Fall EXAM: CHEST - 2 VIEW COMPARISON:  04/30/2019 FINDINGS: Cardiac enlargement without heart failure. Interval removal of pacemaker  generator. Leads remain in the right atrium and right ventricle. Cardiac loop recorder has been placed since the prior study Negative for heart failure. Lungs are clear without infiltrate or effusion. IMPRESSION: No active cardiopulmonary disease. Electronically Signed   By: Marlan Palau M.D.   On: 08/15/2021 15:04   CT HEAD WO CONTRAST ( )  Result Date: 08/15/2021 CLINICAL DATA:  Fall EXAM: CT HEAD WITHOUT CONTRAST CT MAXILLOFACIAL WITHOUT CONTRAST CT CERVICAL SPINE WITHOUT CONTRAST TECHNIQUE: Multidetector CT imaging of the head, cervical spine, and maxillofacial structures were performed using the standard protocol without intravenous contrast. Multiplanar CT image reconstructions of the cervical spine and maxillofacial structures were also generated. COMPARISON:  05/21/2014 FINDINGS: CT HEAD FINDINGS Brain: Advanced atrophy which has progressed. Progressive ventricular enlargement. Moderate white matter hypodensity bilaterally has progressed. Negative for acute infarct, hemorrhage, mass Vascular: Negative for hyperdense vessel Skull: Negative Other: None CT MAXILLOFACIAL FINDINGS Osseous: Mildly displaced nasal bone fracture. No other facial fracture. Orbits: Negative for orbital fracture. Bilateral cataract extraction. Sinuses: Air-fluid level right maxillary sinus. High density blood is present in the sinus presumably from the nasal bone fracture. Soft tissues: Negative CT CERVICAL SPINE FINDINGS Alignment: Mild anterolisthesis C5-6.  Cervical kyphosis. Skull base and vertebrae: Negative for fracture Soft tissues and spinal canal: Negative for soft tissue injury or mass. Disc levels: Multilevel disc degeneration and spurring most notably C3 through C6. Upper chest: Lung apices clear bilaterally. Other: None IMPRESSION: 1. Advanced atrophy which has progressed since 2015. Progressive small vessel ischemia. 2. Progressive hydrocephalus likely due to atrophy however NPH is possible. 3. Mildly displaced  nasal bone fracture. Blood in the right maxillary sinus. No other facial fracture 4. Negative for cervical spine fracture Electronically Signed   By: Marlan Palau M.D.   On: 08/15/2021 15:14   CT Cervical Spine Wo Contrast  Result Date: 08/15/2021 CLINICAL DATA:  Fall EXAM: CT HEAD WITHOUT CONTRAST CT MAXILLOFACIAL WITHOUT CONTRAST CT CERVICAL SPINE WITHOUT CONTRAST TECHNIQUE: Multidetector CT imaging of the head, cervical spine, and maxillofacial structures were performed using the standard protocol without intravenous contrast. Multiplanar CT image reconstructions of the cervical spine and maxillofacial structures were also generated. COMPARISON:  05/21/2014 FINDINGS:  CT HEAD FINDINGS Brain: Advanced atrophy which has progressed. Progressive ventricular enlargement. Moderate white matter hypodensity bilaterally has progressed. Negative for acute infarct, hemorrhage, mass Vascular: Negative for hyperdense vessel Skull: Negative Other: None CT MAXILLOFACIAL FINDINGS Osseous: Mildly displaced nasal bone fracture. No other facial fracture. Orbits: Negative for orbital fracture. Bilateral cataract extraction. Sinuses: Air-fluid level right maxillary sinus. High density blood is present in the sinus presumably from the nasal bone fracture. Soft tissues: Negative CT CERVICAL SPINE FINDINGS Alignment: Mild anterolisthesis C5-6.  Cervical kyphosis. Skull base and vertebrae: Negative for fracture Soft tissues and spinal canal: Negative for soft tissue injury or mass. Disc levels: Multilevel disc degeneration and spurring most notably C3 through C6. Upper chest: Lung apices clear bilaterally. Other: None IMPRESSION: 1. Advanced atrophy which has progressed since 2015. Progressive small vessel ischemia. 2. Progressive hydrocephalus likely due to atrophy however NPH is possible. 3. Mildly displaced nasal bone fracture. Blood in the right maxillary sinus. No other facial fracture 4. Negative for cervical spine fracture  Electronically Signed   By: Marlan Palau M.D.   On: 08/15/2021 15:14   CT Maxillofacial Wo Contrast  Result Date: 08/15/2021 CLINICAL DATA:  Fall EXAM: CT HEAD WITHOUT CONTRAST CT MAXILLOFACIAL WITHOUT CONTRAST CT CERVICAL SPINE WITHOUT CONTRAST TECHNIQUE: Multidetector CT imaging of the head, cervical spine, and maxillofacial structures were performed using the standard protocol without intravenous contrast. Multiplanar CT image reconstructions of the cervical spine and maxillofacial structures were also generated. COMPARISON:  05/21/2014 FINDINGS: CT HEAD FINDINGS Brain: Advanced atrophy which has progressed. Progressive ventricular enlargement. Moderate white matter hypodensity bilaterally has progressed. Negative for acute infarct, hemorrhage, mass Vascular: Negative for hyperdense vessel Skull: Negative Other: None CT MAXILLOFACIAL FINDINGS Osseous: Mildly displaced nasal bone fracture. No other facial fracture. Orbits: Negative for orbital fracture. Bilateral cataract extraction. Sinuses: Air-fluid level right maxillary sinus. High density blood is present in the sinus presumably from the nasal bone fracture. Soft tissues: Negative CT CERVICAL SPINE FINDINGS Alignment: Mild anterolisthesis C5-6.  Cervical kyphosis. Skull base and vertebrae: Negative for fracture Soft tissues and spinal canal: Negative for soft tissue injury or mass. Disc levels: Multilevel disc degeneration and spurring most notably C3 through C6. Upper chest: Lung apices clear bilaterally. Other: None IMPRESSION: 1. Advanced atrophy which has progressed since 2015. Progressive small vessel ischemia. 2. Progressive hydrocephalus likely due to atrophy however NPH is possible. 3. Mildly displaced nasal bone fracture. Blood in the right maxillary sinus. No other facial fracture 4. Negative for cervical spine fracture Electronically Signed   By: Marlan Palau M.D.   On: 08/15/2021 15:14     ____________________________________________   PROCEDURES  Procedure(s) performed (including Critical Care):  Procedures   ____________________________________________   INITIAL IMPRESSION / ASSESSMENT AND PLAN / ED COURSE      Patient presents with above-stated history exam for assessment after a fall that occurred earlier today.  Patient is complaining of some very mild headache but is declining any analgesia on arrival.  She is afebrile and hemodynamically stable and is not oriented but otherwise with a nonfocal neurological exam.  She is some bruising and edema on her nose which is little tender but no other significant trauma to the face scalp head or neck and is otherwise neurovascularly intact in her extremities.  Unclear if patient had a mechanical fall about rolling out of bed or got a bed and syncopized that she does not recall the details only waking up out of bed with some blood on her face.  Given her age and concern for possible syncope or presyncope I did obtain a CT head and C-spine as well as a chest x-ray, EKG, troponin basic labs and a urinalysis.  CT head shows advanced atrophy with progressive small vessel ischemic disease and progressive hydrocephalus likely due to NPH.  There is a mildly displaced nasal bone fracture and some blood in the right maxillary sinus without other facial fractures noted.  No evidence of acute C-spine fracture or traumatic listhesis.  Chest x-ray has no evidence of effusion, edema, pneumothorax, rib fracture or any other acute thoracic process.  ECG shows paced rhythm without clear evidence of ischemia.   CBC shows mild leukocytosis with WBC count of 11.7 suspect reactive in setting of trauma with hemoglobin of 15.5 and normal platelets.  BMP shows no significant electrolyte or metabolic derangements and kidney function at baseline.  Magnesium is within normal limits.  Discussed patient's initial work-up with her son at bedside and  he notes the patient has known history of NPH.  He states he feels that patient would be much safer for further evaluation emergency room PT OT to see if she needs to be placed in SNF nursing facility versus home health is he again notes that she does not have 24-hour supervision and is fairly disoriented at baseline.  Care patient signed over to assuming father approximately 3:45 PM.  Plan is to follow-up troponin and UA.  If these are unremarkable plan is to place [facility] order and border order set as patient will need PT OT evaluations due to some progressive underlying dementia and cognitive issues likely exacerbated by NPH resulting in a fall.  If troponin and UA are unremarkable patient does not necessarily need to be admitted for this.     ____________________________________________   FINAL CLINICAL IMPRESSION(S) / ED DIAGNOSES  Final diagnoses:  Fall, initial encounter  Dementia, unspecified dementia severity, unspecified dementia type, unspecified whether behavioral, psychotic, or mood disturbance or anxiety (HCC)  Closed fracture of nasal bone, initial encounter    Medications  venlafaxine XR (EFFEXOR-XR) 24 hr capsule 75 mg (has no administration in time range)  donepezil (ARICEPT) tablet 10 mg (has no administration in time range)  folic acid (FOLVITE) tablet 1 mg (has no administration in time range)  levothyroxine (SYNTHROID) tablet 50 mcg (has no administration in time range)  memantine (NAMENDA) tablet 10 mg (has no administration in time range)  metoprolol succinate (TOPROL-XL) 24 hr tablet 50 mg (has no administration in time range)  raloxifene (EVISTA) tablet 60 mg (has no administration in time range)  omega-3 acid ethyl esters (LOVAZA) capsule 1 g (has no administration in time range)  Vitamin D (Ergocalciferol) (DRISDOL) capsule 50,000 Units (has no administration in time range)     ED Discharge Orders     None        Note:  This document was prepared using  Dragon voice recognition software and may include unintentional dictation errors.    Gilles Chiquito, MD 08/15/21 1545    Gilles Chiquito, MD 08/15/21 [phone]

## 2021-08-15 NOTE — ED Notes (Signed)
Patient finished supper tray. Room darkened per patient's request. Patient remains calm and pleasant.

## 2021-08-16 NOTE — Progress Notes (Signed)
Occupational Therapy Treatment Patient Details Name: Kerri [name] MRN: [id number] DOB: 03/18/1931 Today's Date: 08/16/2021   History of present illness Pt is a 87 y/o F with PMH: HLD, HTN, hypothyroidism, CKD, dementia, and osteoporosis who presented via EMS from home with c/o fall. 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   OT comments  Pt seen for OT tx this date to f/u re: safety with ADLs/ADL mobility. Pt requires CGA/SBA to stand with AD and close SBA for fxl mobility. She's still noted to have decreased standing tolerance and balance. OT engages pt in dynamic sitting balance tasks to simulate LB ADLs such as LB dressing. Pt left seated in chair to eat breakfast with all needs met and call light in reach. Will continue to follow.   Recommendations for follow up therapy are one component of a multi-disciplinary discharge planning process, led by the attending physician.  Recommendations may be updated based on patient status, additional functional criteria and insurance authorization.    Follow Up Recommendations  Home health OT    Assistance Recommended at Discharge Frequent or constant Supervision/Assistance  Equipment Recommendations  None recommended by OT (pt with all necearry equipment, would potentially benefit from some aide help on weekends)    Recommendations for Other Services      Precautions / Restrictions Precautions Precautions: Fall Restrictions Weight Bearing Restrictions: No       Mobility Bed Mobility Overal bed mobility: Needs Assistance Bed Mobility: Supine to Sit     Supine to sit: Min assist;HOB elevated     General bed mobility comments: up to chair when OT presents    Transfers Overall transfer level: Needs  assistance Equipment used: Rolling walker (2 wheels) Transfers: Sit to/from Stand Sit to Stand: Min guard;Supervision Stand pivot transfers: Min assist         General transfer comment: CGA/SUPV for safety     Balance Overall balance assessment: Needs assistance Sitting-balance support: Feet supported Sitting balance-Leahy Scale: Good Sitting balance - Comments: supervision static sitting   Standing balance support: Bilateral upper extremity supported;During functional activity Standing balance-Leahy Scale: Fair Standing balance comment: requires BUE support & min assist for static standing; only able to stand for ~1 minute before requiring seated rest break 2/2 fatigue                           ADL either performed or assessed with clinical judgement   ADL Overall ADL's : Needs assistance/impaired                     Lower Body Dressing: Min guard;Sitting/lateral leans Lower Body Dressing Details (indicate cue type and reason): CGA for stafety with dynamic balance to don socks             Functional mobility during ADLs: Supervision/safety;Rolling walker (2 wheels) (close SBA for safety)       Vision       Perception     Praxis      Cognition Arousal/Alertness: Awake/alert Behavior During Therapy: WFL for tasks assessed/performed Overall Cognitive Status: History of cognitive impairments - at baseline  General Comments: oriented x3 (not to time), follows commands, pleasant          Exercises Other Exercises Other Exercises: OT ed with pt re: d/c recommendations, need to increase family supervision, not to take steps at her front entrance by herself, other safety/fall prevention considerations   Shoulder Instructions       General Comments Pt found to be in bed with bed saturated with urine (of note, pt reports she wears pull ups at home). PT provides total assist with changing into clean  gown & brief & changing bed linens.    Pertinent Vitals/ Pain       Pain Assessment: No/denies pain  Home Living Family/patient expects to be discharged to:: Private residence Living Arrangements: Alone Available Help at Discharge: Family;Available PRN/intermittently (children) Type of Home: House Home Access: Stairs to enter [employer] of Steps: 3 to front, garage is flat entrance Entrance Stairs-Rails: Can reach both Home Layout: One level;Laundry or work area in basement;Able to live on main level with bedroom/bathroom Alternate Therapist, sports of Steps: 12 Alternate Level Stairs-Rails: Can reach both Bathroom Shower/Tub: Chief Strategy Officer: Shower seat - built Medical sales representative (2 wheels);Cane - single point;Grab bars - tub/shower;BSC   Additional Comments: son, via telephone, reports that pt's DIL is with her on and off from 9-7 during week days, Pt's daughter assisted on weekends but was just recently released from the hospital herself.      Prior Functioning/Environment              Frequency  Min 1X/week        Progress Toward Goals  OT Goals(current goals can now be found in the care plan section)  Progress towards OT goals: Progressing toward goals  Acute Rehab OT Goals Patient Stated Goal: to go home OT Goal Formulation: With patient Time For Goal Achievement: 08/29/21 Potential to Achieve Goals: Good  Plan Discharge plan remains appropriate    Co-evaluation                 AM-PAC OT "6 Clicks" Daily Activity     Outcome Measure   Help from another person eating meals?: None Help from another person taking care of personal grooming?: A Little Help from another person toileting, which includes using toliet, bedpan, or urinal?: A Little Help from another person bathing (including washing, rinsing, drying)?: A Little Help from another person to put on and taking off regular upper body clothing?: None Help  from another person to put on and taking off regular lower body clothing?: A Little 6 Click Score: 20    End of Session Equipment Utilized During Treatment: Gait belt;Rolling walker (2 wheels)  OT Visit Diagnosis: Unsteadiness on feet (R26.81);Muscle weakness (generalized) (M62.81)   Activity Tolerance Patient tolerated treatment well   Patient Left with call bell/phone within reach;in chair   Nurse Communication Mobility status        Time: 6387-5643 OT Time Calculation (min): 9 min  Charges: OT General Charges $OT Visit: 1 Visit OT Treatments $Self Care/Home Management : 8-22 mins  Gerrianne Scale, MS, OTR/L ascom [phone] 08/16/21, 12:41 PM

## 2021-08-16 NOTE — ED Provider Notes (Signed)
Today's Vitals   08/16/21 0244 08/16/21 0300 08/16/21 0400 08/16/21 0500  BP: 116/67 124/85 127/84 111/87  Pulse: 99 81 81 86  Resp: 16 18 16 16   Temp:      TempSrc:      SpO2: 94% 97% 93% 96%  Weight:      Height:      PainSc:       Body mass index is 22.81 kg/m.   Patient currently resting comfortably.  No acute events overnight.  Awaiting social work disposition.   Kamrie Fanton, , DO 08/16/21 [phone]

## 2021-08-16 NOTE — Progress Notes (Signed)
CSW spoke with patients son to see if they were interested in home health services. Family agreed and stated patient already has two wheelchairs and a walker at home. CSW will not need to order any more equipment. Family will be coming to pick up patient when discharged.

## 2021-08-16 NOTE — ED Notes (Signed)
This RN spoke to son who requests we do not give morning meds and they will administer tem at home

## 2021-08-16 NOTE — Progress Notes (Signed)
CSW notified the family that [facility] has accepted patient for PT/OT. Patients son stated he will be on the way to the hospital.

## 2021-08-16 NOTE — ED Provider Notes (Signed)
I think it is patient approximately 0 700.  Patient often criticized for full details regarding patient's initial evaluation assessment.  In brief patient is pending PT and OT evaluations after a fall yesterday initially being seen by this examiner.  PT and OT did evaluate patient and recommend discharge home with rolling walker and home health.  Home health will be arranged by Child psychotherapist.  Rolling walker ordered.  Patient discharged to family in stable condition.    Gilles Chiquito, MD 08/16/21 1032

## 2021-08-16 NOTE — Evaluation (Signed)
Physical Therapy Evaluation Patient Details Name: Kerri [name] MRN: [id number] DOB: 1931/02/07 Today's Date: 08/16/2021  History of Present Illness  Pt is a 87 y/o F with PMH: HLD, HTN, hypothyroidism, CKD, dementia, and osteoporosis who presented via EMS from home with c/o fall. 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  Clinical Impression  Pt seen for PT evaluation with pt received in bed & when asked why she is here she states "I guess I've come here to die" and later during session pt states "next time I'll just stay home & die" with PT providing encouragement. Pt is AxOx3 with poor recall as she is unable to recall current year despite PT educating her multiple times throughout session. On this date, pt requires min assist for bed mobility with HOB elevated, min assist for transfers, & min assist for gait with RW. Pt demonstrates a decline in functional mobility as prior to admission pt was using a cane for mobility. At this time, pt would benefit from HHPT services upon d/c but this PT recommends 24 hr supervision as pt is unsafe to be home alone & is a high fall risk.      Recommendations for follow up therapy are one component of a multi-disciplinary discharge planning process, led by the attending physician.  Recommendations may be updated based on patient status, additional functional criteria and insurance authorization.  Follow Up Recommendations Home health PT    Assistance Recommended at Discharge Frequent or constant Supervision/Assistance  Functional Status Assessment Patient has had a recent decline in their functional status and demonstrates the ability to make significant improvements in function in a reasonable and predictable amount of time.  Equipment  Recommendations  Rolling walker (2 wheels)    Recommendations for Other Services       Precautions / Restrictions Precautions Precautions: Fall Restrictions Weight Bearing Restrictions: No      Mobility  Bed Mobility Overal bed mobility: Needs Assistance Bed Mobility: Supine to Sit     Supine to sit: Min assist;HOB elevated     General bed mobility comments: assistance to upright trunk    Transfers Overall transfer level: Needs assistance Equipment used: Rolling walker (2 wheels) Transfers: Sit to/from [employer] Transfers Sit to Stand: Min assist Stand pivot transfers: Min assist         General transfer comment: extra time & assistance to power up to standing    Ambulation/Gait Ambulation/Gait assistance: Min assist;Min guard Gait Distance (Feet): 75 Feet Assistive device: Rolling walker (2 wheels) Gait Pattern/deviations: Decreased step length - right;Decreased step length - left;Decreased dorsiflexion - right;Decreased dorsiflexion - left;Decreased stride length Gait velocity: decreased   General Gait Details: decreased heel strike BLE, difficulty with turning & squaring up to sitting surface requiring max multimodal cuing, pt also has feet outside of base of RW when turning  Stairs            Wheelchair Mobility    Modified Rankin (Stroke Patients Only)       Balance Overall balance assessment: Needs assistance Sitting-balance support: Feet supported Sitting balance-Leahy Scale: Good Sitting balance - Comments: supervision static sitting   Standing balance support: Bilateral upper extremity supported;During functional activity Standing balance-Leahy Scale: Fair Standing balance comment: requires BUE support & min assist for static standing; only able to stand for ~  1 minute before requiring seated rest break 2/2 fatigue                             Pertinent Vitals/Pain Pain Assessment: No/denies pain    Home Living  Family/patient expects to be discharged to:: Private residence Living Arrangements: Alone Available Help at Discharge: Family;Available PRN/intermittently (children) Type of Home: House Home Access: Stairs to enter Entrance Stairs-Rails: Can reach both Entrance Stairs-Number of Steps: 3 to front, garage is flat entrance Alternate Level Stairs-Number of Steps: 12 Home Layout: One level;Laundry or work area in basement;Able to live on main level with bedroom/bathroom Home Equipment: Shower seat - built Charity fundraiser (2 wheels);Cane - single point;Grab bars - tub/shower;BSC Additional Comments: son, via telephone, reports that pt's DIL is with her on and off from 9-7 during week days, Pt's daughter assisted on weekends but was just recently released from the hospital herself.    Prior Function Prior Level of Function : Independent/Modified Independent             Mobility Comments: MOD I with cane for fxl mobility, denies fall history outside of this hospitalization but is poor historian. Her son reports they encourage her to use RW instead. ADLs Comments: perfoms basic self care mostly I'ly, DIL or daughter have been helping some with bathing. Pt does her own light meal prep and cleaning. Son/DIL/DTR alteranate driving/getting groceries for pt.     Hand Dominance        Extremity/Trunk Assessment   Upper Extremity Assessment Upper Extremity Assessment: Generalized weakness    Lower Extremity Assessment Lower Extremity Assessment: Generalized weakness    Cervical / Trunk Assessment Cervical / Trunk Assessment: Kyphotic  Communication   Communication: No difficulties  Cognition Arousal/Alertness: Awake/alert Behavior During Therapy: WFL for tasks assessed/performed Overall Cognitive Status: History of cognitive impairments - at baseline                                 General Comments: Pt oriented to self, place, situation, but not time (PT orients pt to  year but pt unable to recall at any point during session). Pt is able to follow all simple commands.        General Comments General comments (skin integrity, edema, etc.): Pt found to be in bed with bed saturated with urine (of note, pt reports she wears pull ups at home). PT provides total assist with changing into clean gown & brief & changing bed linens.    Exercises     Assessment/Plan    PT Assessment Patient needs continued PT services  PT Problem List Decreased strength;Decreased coordination;Decreased activity tolerance;Decreased balance;Decreased mobility;Decreased knowledge of use of DME       PT Treatment Interventions DME instruction;Therapeutic exercise;Gait training;Balance training;Stair training;Modalities;Neuromuscular re-education;Functional mobility training;Therapeutic activities;Patient/family education    PT Goals (Current goals can be found in the Care Plan section)  Acute Rehab PT Goals Patient Stated Goal: to eat breakfast PT Goal Formulation: With patient Time For Goal Achievement: 08/30/21 Potential to Achieve Goals: Good    Frequency Min 2X/week   Barriers to discharge Decreased caregiver support      Co-evaluation               AM-PAC PT "6 Clicks" Mobility  Outcome Measure Help needed turning from your back to your side while in a flat bed without using bedrails?: A  Little Help needed moving from lying on your back to sitting on the side of a flat bed without using bedrails?: A Little Help needed moving to and from a bed to a chair (including a wheelchair)?: A Little Help needed standing up from a chair using your arms (e.g., wheelchair or bedside chair)?: A Little Help needed to walk in hospital room?: A Little Help needed climbing 3-5 steps with a railing? : A Lot 6 Click Score: 17    End of Session Equipment Utilized During Treatment: Gait belt Activity Tolerance: Patient tolerated treatment well;Patient limited by fatigue Patient  left: with call bell/phone within reach (sitting in chair in handoff to OT (reviewed use of call bell with pt verbalizing back to therapist))   PT Visit Diagnosis: Unsteadiness on feet (R26.81);Muscle weakness (generalized) (M62.81)    Time: 1638-4665 PT Time Calculation (min) (ACUTE ONLY): 21 min   Charges:   PT Evaluation $PT Eval Low Complexity: 1 Low PT Treatments $Therapeutic Activity: 8-22 mins        Aleda Grana, PT, DPT 08/16/21, 9:44 AM   Sandi Mariscal 08/16/2021, 9:42 AM

## 2021-10-01 ENCOUNTER — Ambulatory Visit: Payer: Self-pay | Admitting: Urology

## 2021-10-01 ENCOUNTER — Encounter: Payer: Self-pay | Admitting: Urology

## 2021-10-12 ENCOUNTER — Emergency Department: Payer: Medicare Other

## 2021-10-12 ENCOUNTER — Emergency Department
Admission: EM | Admit: 2021-10-12 | Discharge: 2021-10-12 | Disposition: A | Discharge status: Home / Self Care | Payer: Medicare Other | Source: Home / Self Care | Attending: Emergency Medicine | Admitting: Emergency Medicine

## 2021-10-12 ENCOUNTER — Other Ambulatory Visit: Payer: Self-pay

## 2021-10-12 DIAGNOSIS — Z5321 Procedure and treatment not carried out due to patient leaving prior to being seen by health care provider: Secondary | ICD-10-CM | POA: Insufficient documentation

## 2021-10-12 DIAGNOSIS — R531 Weakness: Secondary | ICD-10-CM | POA: Insufficient documentation

## 2021-10-12 DIAGNOSIS — W19XXXA Unspecified fall, initial encounter: Secondary | ICD-10-CM | POA: Insufficient documentation

## 2021-10-12 DIAGNOSIS — N3001 Acute cystitis with hematuria: Secondary | ICD-10-CM | POA: Diagnosis not present

## 2021-10-12 DIAGNOSIS — R4182 Altered mental status, unspecified: Secondary | ICD-10-CM

## 2021-10-12 DIAGNOSIS — F0390 Unspecified dementia without behavioral disturbance: Secondary | ICD-10-CM | POA: Insufficient documentation

## 2021-10-12 DIAGNOSIS — R410 Disorientation, unspecified: Secondary | ICD-10-CM | POA: Diagnosis not present

## 2021-10-12 LAB — CBC
HCT: 41.7 % (ref 36.0–46.0)
Hemoglobin: 13.4 g/dL (ref 12.0–15.0)
MCH: 27.7 pg (ref 26.0–34.0)
MCHC: 32.1 g/dL (ref 30.0–36.0)
MCV: 86.3 fL (ref 80.0–100.0)
Platelets: 177 10*3/uL (ref 150–400)
RBC: 4.83 MIL/uL (ref 3.87–5.11)
RDW: 14.9 % (ref 11.5–15.5)
WBC: 10.2 10*3/uL (ref 4.0–10.5)
nRBC: 0 % (ref 0.0–0.2)

## 2021-10-12 LAB — BASIC METABOLIC PANEL
Anion gap: 11 (ref 5–15)
BUN: 17 mg/dL (ref 8–23)
CO2: 23 mmol/L (ref 22–32)
Calcium: 8.1 mg/dL — ABNORMAL LOW (ref 8.9–10.3)
Chloride: 106 mmol/L (ref 98–111)
Creatinine, Ser: 1.35 mg/dL — ABNORMAL HIGH (ref 0.44–1.00)
GFR, Estimated: 38 mL/min — ABNORMAL LOW (ref >60)
Glucose, Bld: 231 mg/dL — ABNORMAL HIGH (ref 70–99)
Potassium: 3.1 mmol/L — ABNORMAL LOW (ref 3.5–5.1)
Sodium: 140 mmol/L (ref 135–145)

## 2021-10-12 NOTE — ED Triage Notes (Signed)
See first nurse note, pt cannot states why she is here. Cannot answer orientation questions appropriately.  NAD noted

## 2021-10-12 NOTE — ED Provider Notes (Signed)
°  Emergency Medicine Provider Triage Evaluation Note  Kerri [name] , a 88 y.o.female,  was evaluated in triage.  Pt complains of weakness.  Brought in by EMS.  Unwitnessed fall yesterday.  She is not in any apparent distress.  She is unsure why she is here.  History of dementia   Review of Systems  Positive: Weakness Negative: Denies fever, chest pain, vomiting  Physical Exam   Vitals:   10/12/21 1623 10/12/21 1625  BP:  (!) 120/56  Pulse:  60  Resp: 20   Temp: 98.5 F (36.9 C)   SpO2:  92%   Gen:   Awake, no distress   Resp:  Normal effort  MSK:   Moves extremities without difficulty  Other:    Medical Decision Making  Given the patient's initial medical screening exam, the following diagnostic evaluation has been ordered. The patient will be placed in the appropriate treatment space, once one is available, to complete the evaluation and treatment. I have discussed the plan of care with the patient and I have advised the patient that an ED physician or mid-level practitioner will reevaluate their condition after the test results have been received, as the results may give them additional insight into the type of treatment they may need.    Diagnostics: Labs, UA, EKG, CXR, head/neck CT  Treatments: none immediately   Varney Daily, PA 10/12/21 1629    Shaune Pollack, MD 10/12/21 1718

## 2021-10-12 NOTE — ED Triage Notes (Signed)
Pt comes into the ED via EMS from home fell last night, new onset of  immobility, generalized weakness, hx of dementia  122/68 HR94 #20gLhand 96.9 axillary 95%RA [id number]

## 2021-10-14 ENCOUNTER — Ambulatory Visit: Payer: Medicare Other | Admitting: Urology

## 2021-10-14 ENCOUNTER — Other Ambulatory Visit: Payer: Self-pay

## 2021-10-14 DIAGNOSIS — E871 Hypo-osmolality and hyponatremia: Secondary | ICD-10-CM | POA: Diagnosis present

## 2021-10-14 DIAGNOSIS — K5900 Constipation, unspecified: Secondary | ICD-10-CM | POA: Diagnosis present

## 2021-10-14 DIAGNOSIS — E039 Hypothyroidism, unspecified: Secondary | ICD-10-CM | POA: Diagnosis present

## 2021-10-14 DIAGNOSIS — Z20822 Contact with and (suspected) exposure to covid-19: Secondary | ICD-10-CM | POA: Diagnosis present

## 2021-10-14 DIAGNOSIS — N1831 Chronic kidney disease, stage 3a: Secondary | ICD-10-CM | POA: Diagnosis present

## 2021-10-14 DIAGNOSIS — Z79890 Hormone replacement therapy (postmenopausal): Secondary | ICD-10-CM

## 2021-10-14 DIAGNOSIS — I130 Hypertensive heart and chronic kidney disease with heart failure and stage 1 through stage 4 chronic kidney disease, or unspecified chronic kidney disease: Secondary | ICD-10-CM | POA: Diagnosis present

## 2021-10-14 DIAGNOSIS — E876 Hypokalemia: Secondary | ICD-10-CM | POA: Diagnosis present

## 2021-10-14 DIAGNOSIS — M1990 Unspecified osteoarthritis, unspecified site: Secondary | ICD-10-CM | POA: Diagnosis present

## 2021-10-14 DIAGNOSIS — F0283 Dementia in other diseases classified elsewhere, unspecified severity, with mood disturbance: Secondary | ICD-10-CM | POA: Diagnosis present

## 2021-10-14 DIAGNOSIS — I442 Atrioventricular block, complete: Secondary | ICD-10-CM | POA: Diagnosis present

## 2021-10-14 DIAGNOSIS — I5022 Chronic systolic (congestive) heart failure: Secondary | ICD-10-CM | POA: Diagnosis present

## 2021-10-14 DIAGNOSIS — Z79899 Other long term (current) drug therapy: Secondary | ICD-10-CM

## 2021-10-14 DIAGNOSIS — G309 Alzheimer's disease, unspecified: Secondary | ICD-10-CM | POA: Diagnosis present

## 2021-10-14 DIAGNOSIS — R296 Repeated falls: Secondary | ICD-10-CM | POA: Diagnosis present

## 2021-10-14 DIAGNOSIS — N179 Acute kidney failure, unspecified: Secondary | ICD-10-CM | POA: Diagnosis present

## 2021-10-14 DIAGNOSIS — G912 (Idiopathic) normal pressure hydrocephalus: Secondary | ICD-10-CM | POA: Diagnosis present

## 2021-10-14 DIAGNOSIS — N3001 Acute cystitis with hematuria: Principal | ICD-10-CM | POA: Diagnosis present

## 2021-10-14 DIAGNOSIS — E860 Dehydration: Secondary | ICD-10-CM | POA: Diagnosis present

## 2021-10-14 DIAGNOSIS — Z950 Presence of cardiac pacemaker: Secondary | ICD-10-CM

## 2021-10-14 DIAGNOSIS — B379 Candidiasis, unspecified: Secondary | ICD-10-CM | POA: Diagnosis present

## 2021-10-14 DIAGNOSIS — N3281 Overactive bladder: Secondary | ICD-10-CM | POA: Diagnosis present

## 2021-10-14 LAB — BASIC METABOLIC PANEL
Anion gap: 9 (ref 5–15)
BUN: 23 mg/dL (ref 8–23)
CO2: 25 mmol/L (ref 22–32)
Calcium: 8 mg/dL — ABNORMAL LOW (ref 8.9–10.3)
Chloride: 99 mmol/L (ref 98–111)
Creatinine, Ser: 1.62 mg/dL — ABNORMAL HIGH (ref 0.44–1.00)
GFR, Estimated: 30 mL/min — ABNORMAL LOW (ref >60)
Glucose, Bld: 129 mg/dL — ABNORMAL HIGH (ref 70–99)
Potassium: 3 mmol/L — ABNORMAL LOW (ref 3.5–5.1)
Sodium: 133 mmol/L — ABNORMAL LOW (ref 135–145)

## 2021-10-14 LAB — CBC
HCT: 44.4 % (ref 36.0–46.0)
Hemoglobin: 14.3 g/dL (ref 12.0–15.0)
MCH: 27.5 pg (ref 26.0–34.0)
MCHC: 32.2 g/dL (ref 30.0–36.0)
MCV: 85.4 fL (ref 80.0–100.0)
Platelets: 178 10*3/uL (ref 150–400)
RBC: 5.2 MIL/uL — ABNORMAL HIGH (ref 3.87–5.11)
RDW: 14.8 % (ref 11.5–15.5)
WBC: 12.2 10*3/uL — ABNORMAL HIGH (ref 4.0–10.5)
nRBC: 0 % (ref 0.0–0.2)

## 2021-10-14 LAB — CBG MONITORING, ED: Glucose-Capillary: 132 mg/dL — ABNORMAL HIGH (ref 70–99)

## 2021-10-14 NOTE — ED Triage Notes (Signed)
Pt comes via EMS from home with weakness. Pt fell on Sunday night and was found by family on floor. Change in behavior per EMs. Pt was taken to ED and left before seeing MD due to wait.  Pt has increased weakness, falling asleep and no arm strength.   Prior to fall pt was normal and able to walk and communicate with family.  Little bruising under left eye. No on any blood thinners and unknown of loc. Pt does answer questions slowly. Foul odor with urine and hx of UTI. Pt alert to year.

## 2021-10-14 NOTE — ED Provider Triage Note (Signed)
Emergency Medicine Provider Triage Evaluation Note  Kerri [name], a 88 y.o. female  was evaluated in triage.  Pt complains of weakness, foul urine and decreased activity.  Patient presents from home with her adult son, for evaluation of decreased activity over the last several days.  Patient with a history of Alzheimer's at baseline, typically ambulates with a walker.  He did have a fall a few days ago and was evaluated in the ED with a negative head and neck CT at that time.  The son denies any interim falls.  Review of Systems  Positive: Weakness, decreased mobility Negative: FCS  Physical Exam  BP (!) 141/43 (BP Location: Left Arm)    Pulse (!) 59    Temp 98.4 F (36.9 C) (Oral)    Resp 18    SpO2 93%  Gen:   Awake, no distress  dementia at baseline Resp:  Normal effort  MSK:   Moves extremities without difficulty  Other:  CVS: RRR  Medical Decision Making  Medically screening exam initiated at 3:51 PM.  Appropriate orders placed.  Kerri [name] was informed that the remainder of the evaluation will be completed by another provider, this initial triage assessment does not replace that evaluation, and the importance of remaining in the ED until their evaluation is complete.  Geriatric patient with history of Alzheimer presents to the ED after decrease in activity and concern over possible infection.   Kerri Needles, PA-C 10/14/21 1615

## 2021-10-14 NOTE — ED Notes (Signed)
Pt placed on 3L [place] and O2 improved to 93%

## 2021-10-15 ENCOUNTER — Emergency Department: Payer: Medicare Other

## 2021-10-15 ENCOUNTER — Encounter: Payer: Self-pay | Admitting: Emergency Medicine

## 2021-10-15 ENCOUNTER — Inpatient Hospital Stay
Admission: EM | Admit: 2021-10-15 | Discharge: 2021-10-23 | DRG: 690 | Disposition: A | Discharge status: Skilled Nursing Facility | Payer: Medicare Other | Attending: Internal Medicine | Admitting: Internal Medicine

## 2021-10-15 DIAGNOSIS — N1832 Chronic kidney disease, stage 3b: Secondary | ICD-10-CM | POA: Diagnosis present

## 2021-10-15 DIAGNOSIS — R531 Weakness: Secondary | ICD-10-CM

## 2021-10-15 DIAGNOSIS — E039 Hypothyroidism, unspecified: Secondary | ICD-10-CM | POA: Diagnosis present

## 2021-10-15 DIAGNOSIS — N183 Chronic kidney disease, stage 3 unspecified: Secondary | ICD-10-CM | POA: Diagnosis not present

## 2021-10-15 DIAGNOSIS — B370 Candidal stomatitis: Secondary | ICD-10-CM | POA: Diagnosis present

## 2021-10-15 DIAGNOSIS — G309 Alzheimer's disease, unspecified: Secondary | ICD-10-CM | POA: Diagnosis present

## 2021-10-15 DIAGNOSIS — R7989 Other specified abnormal findings of blood chemistry: Secondary | ICD-10-CM | POA: Diagnosis present

## 2021-10-15 DIAGNOSIS — Z20822 Contact with and (suspected) exposure to covid-19: Secondary | ICD-10-CM | POA: Diagnosis present

## 2021-10-15 DIAGNOSIS — I10 Essential (primary) hypertension: Secondary | ICD-10-CM | POA: Diagnosis present

## 2021-10-15 DIAGNOSIS — B379 Candidiasis, unspecified: Secondary | ICD-10-CM | POA: Diagnosis present

## 2021-10-15 DIAGNOSIS — I5022 Chronic systolic (congestive) heart failure: Secondary | ICD-10-CM | POA: Diagnosis present

## 2021-10-15 DIAGNOSIS — N3000 Acute cystitis without hematuria: Secondary | ICD-10-CM

## 2021-10-15 DIAGNOSIS — E876 Hypokalemia: Secondary | ICD-10-CM | POA: Diagnosis present

## 2021-10-15 DIAGNOSIS — N179 Acute kidney failure, unspecified: Secondary | ICD-10-CM | POA: Diagnosis present

## 2021-10-15 DIAGNOSIS — N3001 Acute cystitis with hematuria: Secondary | ICD-10-CM | POA: Diagnosis present

## 2021-10-15 DIAGNOSIS — G912 (Idiopathic) normal pressure hydrocephalus: Secondary | ICD-10-CM | POA: Diagnosis present

## 2021-10-15 DIAGNOSIS — Z79899 Other long term (current) drug therapy: Secondary | ICD-10-CM | POA: Diagnosis not present

## 2021-10-15 DIAGNOSIS — I442 Atrioventricular block, complete: Secondary | ICD-10-CM | POA: Diagnosis present

## 2021-10-15 DIAGNOSIS — E871 Hypo-osmolality and hyponatremia: Secondary | ICD-10-CM | POA: Diagnosis present

## 2021-10-15 DIAGNOSIS — F0390 Unspecified dementia without behavioral disturbance: Secondary | ICD-10-CM | POA: Diagnosis present

## 2021-10-15 DIAGNOSIS — R410 Disorientation, unspecified: Secondary | ICD-10-CM

## 2021-10-15 DIAGNOSIS — N3090 Cystitis, unspecified without hematuria: Secondary | ICD-10-CM

## 2021-10-15 DIAGNOSIS — N3281 Overactive bladder: Secondary | ICD-10-CM | POA: Diagnosis present

## 2021-10-15 DIAGNOSIS — Z950 Presence of cardiac pacemaker: Secondary | ICD-10-CM | POA: Diagnosis not present

## 2021-10-15 DIAGNOSIS — N189 Chronic kidney disease, unspecified: Secondary | ICD-10-CM | POA: Diagnosis present

## 2021-10-15 DIAGNOSIS — I130 Hypertensive heart and chronic kidney disease with heart failure and stage 1 through stage 4 chronic kidney disease, or unspecified chronic kidney disease: Secondary | ICD-10-CM | POA: Diagnosis present

## 2021-10-15 DIAGNOSIS — K5900 Constipation, unspecified: Secondary | ICD-10-CM | POA: Diagnosis present

## 2021-10-15 DIAGNOSIS — E860 Dehydration: Secondary | ICD-10-CM | POA: Diagnosis present

## 2021-10-15 DIAGNOSIS — R296 Repeated falls: Secondary | ICD-10-CM | POA: Diagnosis present

## 2021-10-15 DIAGNOSIS — D72829 Elevated white blood cell count, unspecified: Secondary | ICD-10-CM | POA: Diagnosis not present

## 2021-10-15 DIAGNOSIS — N390 Urinary tract infection, site not specified: Secondary | ICD-10-CM | POA: Diagnosis present

## 2021-10-15 DIAGNOSIS — I5021 Acute systolic (congestive) heart failure: Secondary | ICD-10-CM | POA: Diagnosis not present

## 2021-10-15 DIAGNOSIS — Z79890 Hormone replacement therapy (postmenopausal): Secondary | ICD-10-CM | POA: Diagnosis not present

## 2021-10-15 DIAGNOSIS — M1990 Unspecified osteoarthritis, unspecified site: Secondary | ICD-10-CM | POA: Diagnosis present

## 2021-10-15 DIAGNOSIS — F0283 Dementia in other diseases classified elsewhere, unspecified severity, with mood disturbance: Secondary | ICD-10-CM | POA: Diagnosis present

## 2021-10-15 DIAGNOSIS — N1831 Chronic kidney disease, stage 3a: Secondary | ICD-10-CM | POA: Diagnosis present

## 2021-10-15 LAB — CREATININE, SERUM
Creatinine, Ser: 1.27 mg/dL — ABNORMAL HIGH (ref 0.44–1.00)
GFR, Estimated: 41 mL/min — ABNORMAL LOW (ref >60)

## 2021-10-15 LAB — CBC
HCT: 47.1 % — ABNORMAL HIGH (ref 36.0–46.0)
Hemoglobin: 15 g/dL (ref 12.0–15.0)
MCH: 27.1 pg (ref 26.0–34.0)
MCHC: 31.8 g/dL (ref 30.0–36.0)
MCV: 85.2 fL (ref 80.0–100.0)
Platelets: 205 10*3/uL (ref 150–400)
RBC: 5.53 MIL/uL — ABNORMAL HIGH (ref 3.87–5.11)
RDW: 14.6 % (ref 11.5–15.5)
WBC: 11.6 10*3/uL — ABNORMAL HIGH (ref 4.0–10.5)
nRBC: 0 % (ref 0.0–0.2)

## 2021-10-15 LAB — RESP PANEL BY RT-PCR (FLU A&B, COVID) ARPGX2
Influenza A by PCR: NEGATIVE
Influenza B by PCR: NEGATIVE
SARS Coronavirus 2 by RT PCR: NEGATIVE

## 2021-10-15 LAB — URINALYSIS, MICROSCOPIC (REFLEX)
Squamous Epithelial / HPF: NONE SEEN (ref 0–5)
WBC, UA: >50 WBC/hpf (ref 0–5)

## 2021-10-15 LAB — BRAIN NATRIURETIC PEPTIDE: B Natriuretic Peptide: 398.2 pg/mL — ABNORMAL HIGH (ref 0.0–100.0)

## 2021-10-15 LAB — CK: Total CK: 73 U/L (ref 38–234)

## 2021-10-15 MED ORDER — CEFDINIR 300 MG PO CAPS
300.0000 mg | ORAL_CAPSULE | Freq: Two times a day (BID) | ORAL | Status: DC
Start: 2021-10-16 — End: 2021-10-15

## 2021-10-15 MED ORDER — IOHEXOL 350 MG/ML SOLN
60.0000 mL | Freq: Once | INTRAVENOUS | Status: AC | PRN
  Administered 2021-10-15: 60 mL via INTRAVENOUS
  Filled 2021-10-15: qty 60

## 2021-10-15 MED ORDER — SODIUM CHLORIDE 0.9 % IV SOLN
1.0000 g | INTRAVENOUS | Status: DC
  Administered 2021-10-16 – 2021-10-18 (×3): 1 g via INTRAVENOUS
  Filled 2021-10-15 (×2): qty 1
  Filled 2021-10-15: qty 10
  Filled 2021-10-15: qty 1

## 2021-10-15 MED ORDER — LEVOTHYROXINE SODIUM 50 MCG PO TABS
50.0000 ug | ORAL_TABLET | Freq: Every day | ORAL | Status: DC
  Administered 2021-10-16 – 2021-10-23 (×8): 50 ug via ORAL
  Filled 2021-10-15 (×8): qty 1

## 2021-10-15 MED ORDER — MEMANTINE HCL 5 MG PO TABS
10.0000 mg | ORAL_TABLET | Freq: Every day | ORAL | Status: DC
  Administered 2021-10-15 – 2021-10-22 (×8): 10 mg via ORAL
  Filled 2021-10-15 (×8): qty 2

## 2021-10-15 MED ORDER — POTASSIUM CHLORIDE 20 MEQ PO PACK
40.0000 meq | PACK | ORAL | Status: AC
  Administered 2021-10-15: 40 meq via ORAL

## 2021-10-15 MED ORDER — METOPROLOL SUCCINATE ER 25 MG PO TB24
25.0000 mg | ORAL_TABLET | Freq: Every day | ORAL | Status: DC
  Administered 2021-10-16 – 2021-10-23 (×8): 25 mg via ORAL
  Filled 2021-10-15 (×8): qty 1

## 2021-10-15 MED ORDER — FESOTERODINE FUMARATE ER 4 MG PO TB24
4.0000 mg | ORAL_TABLET | Freq: Every day | ORAL | Status: DC
  Administered 2021-10-16 – 2021-10-23 (×8): 4 mg via ORAL
  Filled 2021-10-15 (×8): qty 1

## 2021-10-15 MED ORDER — ACETAMINOPHEN 325 MG PO TABS
650.0000 mg | ORAL_TABLET | Freq: Four times a day (QID) | ORAL | Status: DC | PRN
  Administered 2021-10-19: 21:00:00 650 mg via ORAL
  Filled 2021-10-15: qty 2

## 2021-10-15 MED ORDER — CEFTRIAXONE SODIUM 1 G IJ SOLR
1.0000 g | Freq: Once | INTRAMUSCULAR | Status: AC
  Administered 2021-10-15: 1 g via INTRAVENOUS
  Filled 2021-10-15: qty 10

## 2021-10-15 MED ORDER — DONEPEZIL HCL 5 MG PO TABS
10.0000 mg | ORAL_TABLET | Freq: Every day | ORAL | Status: DC
  Administered 2021-10-15 – 2021-10-22 (×8): 10 mg via ORAL
  Filled 2021-10-15 (×8): qty 2

## 2021-10-15 MED ORDER — CEFDINIR 300 MG PO CAPS: 300.0000 mg | ORAL_CAPSULE | Freq: Two times a day (BID) | ORAL | 0 refills | Status: DC

## 2021-10-15 MED ORDER — ENOXAPARIN SODIUM 30 MG/0.3ML IJ SOSY
30.0000 mg | PREFILLED_SYRINGE | INTRAMUSCULAR | Status: DC
  Administered 2021-10-15: 30 mg via SUBCUTANEOUS
  Filled 2021-10-15: qty 0.3

## 2021-10-15 NOTE — Evaluation (Signed)
Physical Therapy Evaluation Patient Details Name: Kerri [name] MRN: [id number] DOB: May 12, 1931 Today's Date: 10/15/2021  History of Present Illness  Pt is an 88 y.o. female presenting to ED 1/4 with generalized weakness.  Pt with unwitnessed fall at home Monday morning and has become more confused and weaker than normal.  Pt noted to be hypoxic in ED.  PMH includes CHF, complete heart block s/p pacemaker placement, NPH, dementia, htn, CKD.  Clinical Impression  Prior to ED visit, pt was ambulatory with RW; lives alone; pt's daughter in law stays with pt M-F 9am-7pm and pt's daughter brings pt meals (breakfast, lunch, and dinner) on weekends.  Currently pt is min to mod assist with bed mobility; CGA to min assist with transfers using RW; and pt unable to ambulate with 1 assist, RW use, and max cueing (pt able to advance L LE a little but unable to take any steps with R LE).  Pt oriented to person, general situation (here d/t fall), and general place (not time) but noted with difficulty initiating movement, sequencing, and following cues during session (pt's daughter in law reports pt's cognition is different than baseline).  O2 sats WFL on room air during sessions activities.  Pt would benefit from skilled PT to address noted impairments and functional limitations (see below for any additional details).  Per discussion with pt's daughter in law, pt currently does not appear to have the required assist to safely discharge home.  D/t this, upon hospital discharge, pt would benefit from SNF.    Recommendations for follow up therapy are one component of a multi-disciplinary discharge planning process, led by the attending physician.  Recommendations may be updated based on patient status, additional functional criteria and insurance authorization.  Follow Up Recommendations Skilled nursing-short term rehab (<3 hours/day)    Assistance Recommended at Discharge Frequent or constant Supervision/Assistance   Patient can return home with the following  Two people to help with walking and/or transfers;A lot of help with bathing/dressing/bathroom;Assistance with cooking/housework;Direct supervision/assist for medications management;Assist for transportation;Direct supervision/assist for financial management;Help with stairs or ramp for entrance    Equipment Recommendations Rolling walker (2 wheels);BSC/3in1;Wheelchair (measurements PT);Wheelchair cushion (measurements PT)  Recommendations for Other Services  OT consult    Functional Status Assessment Patient has had a recent decline in their functional status and demonstrates the ability to make significant improvements in function in a reasonable and predictable amount of time.     Precautions / Restrictions Precautions Precautions: Fall Restrictions Weight Bearing Restrictions: No      Mobility  Bed Mobility Overal bed mobility: Needs Assistance Bed Mobility: Supine to Sit;Sit to Supine     Supine to sit: Min assist;HOB elevated Sit to supine: Min assist;Mod assist   General bed mobility comments: assist for trunk semi-supine to sitting edge of stretcher bed; assist for trunk and LE's sit to semi-supine in stretcher bed    Transfers Overall transfer level: Needs assistance Equipment used: Rolling walker (2 wheels) Transfers: Sit to/from Stand Sit to Stand: Min guard;Min assist           General transfer comment: x2 trials standing from stretcher bed with UE support pushing off of stretcher bed CGA; x2 trials standing with B UE support on RW requiring min assist for balance; squat pivot to R along bed with min to mod assist x1 (x4 repetitions) to scoot higher up in bed prior to laying down    Ambulation/Gait Ambulation/Gait assistance: Mod assist Gait Distance (Feet): 0 Feet Assistive  device: Rolling walker (2 wheels)         General Gait Details: pt able to take some small steps in place and slightly forward with L LE  but unable to advance R LE with max cueing  Stairs            Wheelchair Mobility    Modified Rankin (Stroke Patients Only)       Balance Overall balance assessment: Needs assistance Sitting-balance support: No upper extremity supported;Feet supported Sitting balance-Leahy Scale: Good Sitting balance - Comments: steady sitting reaching within BOS   Standing balance support: Single extremity supported Standing balance-Leahy Scale: Poor Standing balance comment: pt requiring at least single UE support on RW for static standing balance                             Pertinent Vitals/Pain Pain Assessment: Faces Faces Pain Scale: Hurts a little bit Pain Location: chronic R hip pain Pain Descriptors / Indicators: Discomfort Pain Intervention(s): Limited activity within patient's tolerance;Monitored during session;Repositioned Vitals (HR and O2 on room air) stable and WFL throughout treatment session.    Home Living Family/patient expects to be discharged to:: Private residence Living Arrangements: Alone Available Help at Discharge: Family;Available PRN/intermittently Type of Home: House Home Access: Stairs to enter Entrance Stairs-Rails: Can reach both Entrance Stairs-Number of Steps: 3 to front, garage is flat entrance   Home Layout: One level;Laundry or work area in basement;Able to live on main level with bedroom/bathroom Home Equipment: Agricultural consultant (2 wheels);BSC/3in1;Cane - single point;Grab bars - tub/shower;Shower seat - built in Additional Comments: Pt's daughter in law is with pt M-F 9am-7pm (assists pt as needed; walks behind pt); daughter comes to bring meals to pt on weekends (breakfast, lunch, and dinner) but does not stay rest of the day    Prior Function Prior Level of Function : Needs assist             Mobility Comments: Ambulatory with RW; 1 fall earlier this week but no other recent "falls" (pt has "slid" off of couch)       Hand  Dominance        Extremity/Trunk Assessment   Upper Extremity Assessment Upper Extremity Assessment: Generalized weakness    Lower Extremity Assessment Lower Extremity Assessment: Generalized weakness (at least 3/5 AROM hip flexion, knee flexion/extension, and DF/PF)    Cervical / Trunk Assessment Cervical / Trunk Assessment: Normal  Communication   Communication: No difficulties  Cognition Arousal/Alertness: Awake/alert Behavior During Therapy: WFL for tasks assessed/performed Overall Cognitive Status: Impaired/Different from baseline Area of Impairment: Following commands;Awareness;Problem solving                       Following Commands: Follows one step commands inconsistently     Problem Solving: Slow processing;Decreased initiation;Difficulty sequencing;Requires verbal cues;Requires tactile cues General Comments: Oriented to person, general place, general situation (here d/t fall), but not date (reported it was December 2005)        General Comments  Nursing cleared pt for participation in physical therapy.  Pt agreeable to PT session.    Exercises  Transfer and ambulation training   Assessment/Plan    PT Assessment Patient needs continued PT services  PT Problem List Decreased strength;Decreased activity tolerance;Decreased balance;Decreased mobility;Decreased cognition;Decreased knowledge of use of DME;Decreased safety awareness;Decreased knowledge of precautions;Pain       PT Treatment Interventions DME instruction;Gait training;Functional mobility training;Stair training;Therapeutic activities;Therapeutic exercise;Balance  training;Patient/family education;Cognitive remediation    PT Goals (Current goals can be found in the Care Plan section)  Acute Rehab PT Goals Patient Stated Goal: to improve walking PT Goal Formulation: With patient/family Time For Goal Achievement: 10/29/21 Potential to Achieve Goals: Good    Frequency Min 2X/week      Co-evaluation               AM-PAC PT "6 Clicks" Mobility  Outcome Measure Help needed turning from your back to your side while in a flat bed without using bedrails?: A Little Help needed moving from lying on your back to sitting on the side of a flat bed without using bedrails?: A Little Help needed moving to and from a bed to a chair (including a wheelchair)?: A Little Help needed standing up from a chair using your arms (e.g., wheelchair or bedside chair)?: A Little Help needed to walk in hospital room?: Total Help needed climbing 3-5 steps with a railing? : Total 6 Click Score: 14    End of Session Equipment Utilized During Treatment: Gait belt Activity Tolerance: Patient tolerated treatment well Patient left: in bed;with call bell/phone within reach;with family/visitor present;with nursing/sitter in room Nurse Communication: Mobility status;Precautions PT Visit Diagnosis: Muscle weakness (generalized) (M62.81);History of falling (Z91.81);Difficulty in walking, not elsewhere classified (R26.2)    Time: 2297-9892 PT Time Calculation (min) (ACUTE ONLY): 39 min   Charges:   PT Evaluation $PT Eval Low Complexity: 1 Low PT Treatments $Therapeutic Activity: 23-37 mins       Hendricks Limes, PT 10/15/21, 12:51 PM

## 2021-10-15 NOTE — ED Notes (Signed)
Patient transported to CT 

## 2021-10-15 NOTE — ED Provider Notes (Addendum)
Procedures     ----------------------------------------- 10:19 AM on 10/15/2021 ----------------------------------------- CT angiogram negative for PE, pneumonia, or other acute findings.  On reassessment, patient is maintaining oxygen saturation of 95% and above on room air.  She denies any shortness of breath or other respiratory symptoms.  Denies any history of lung disease, and has never smoked.  Given all this I think that her episode of hypoxia documented earlier is due to some transient events such as congestion or erroneous from blood pressure cuff, and not requiring further treatment or work-up at this time.  She can follow-up with primary care to continue monitoring this.   ----------------------------------------- 2:04 PM on 10/15/2021 ----------------------------------------- After receiving IV Rocephin, family notes that patient has also had a pronounced decline in functional status over the last few days, now being unable to walk at home.  Family is not able to provide adequate assistance given her current needs.  Patient was evaluated by physical therapy who also notes that patient is able to stand with assistance but not able to safely walk any distance.    Given her acute functional decline in the setting of acute illness from UTI, risk of injury at home, I will contact the hospitalist for further management.  Final diagnoses:  Weakness  Confusion  Cystitis      Sharman Cheek, MD 10/15/21 1406

## 2021-10-15 NOTE — Progress Notes (Signed)
Attempted to call pts daughters x2 to complete pt profile, no answer, will notify day shift to try again

## 2021-10-15 NOTE — ED Provider Notes (Signed)
[facility] Provider Note    Event Date/Time   First MD Initiated Contact with Patient 10/15/21 [phone]     (approximate)   History   Weakness   HPI  QUYNN VILCHIS is a 88 y.o. female with a history of CHF the EF of 40%, complete heart block status post pacemaker placement, normal pressure hydrocephalus, dementia, hypertension, chronic kidney disease who presents for evaluation of generalized weakness.  History is gathered from patient and her son who was at bedside.  According to them patient had a great weekend and spent a lot of time with family.  3 days ago on Monday morning she had a unwitnessed fall at home and was found by her daughter on the floor.  Since then she has become more confused and weaker than normal.  No cough or congestion, no chest pain or shortness of breath, no headache, no nausea or vomiting, no diarrhea.  She is complaining of some burning with urination for the last few days but denies abdominal pain or flank pain, denies fever or chills.     Past Medical History:  Diagnosis Date   Arthritis     Past Surgical History:  Procedure Laterality Date   PACEMAKER LEADLESS INSERTION N/A 01/07/2021   Procedure: PACEMAKER LEADLESS INSERTION;  Surgeon: Marcina Millard, MD;  Location: [facility] INVASIVE CV LAB;  Service: Cardiovascular;  Laterality: N/A;   PPM GENERATOR REMOVAL N/A 01/07/2021   Procedure: PPM GENERATOR REMOVAL;  Surgeon: Marcina Millard, MD;  Location: [facility] INVASIVE CV LAB;  Service: Cardiovascular;  Laterality: N/A;     Physical Exam   Triage Vital Signs: ED Triage Vitals [10/14/21 1435]  Enc Vitals Group     BP (!) 141/43     Pulse Rate (!) 59     Resp 18     Temp 98.4 F (36.9 C)     Temp Source Oral     SpO2 93 %     Weight      Height      Head Circumference      Peak Flow      Pain Score      Pain Loc      Pain Edu?      Excl. in GC?     Most recent vital signs: Vitals:   10/15/21 0142 10/15/21  0637  BP: (!) 113/96 132/73  Pulse: 60 72  Resp: 18 18  Temp: 98.2 F (36.8 C) 98.5 F (36.9 C)  SpO2: 93% 95%     Constitutional: Alert and oriented. Well appearing and in no apparent distress. HEENT:      Head: Normocephalic and atraumatic.         Eyes: Conjunctivae are normal. Sclera is non-icteric.       Mouth/Throat: Mucous membranes are moist.       Neck: Supple with no signs of meningismus. Cardiovascular: Regular rate and rhythm. No murmurs, gallops, or rubs. 2+ symmetrical distal pulses are present in all extremities.  Respiratory: Normal respiratory effort. Hypoxic with sats between 88-93% on RA. Lungs are clear to auscultation bilaterally.  Gastrointestinal: Soft, non tender, and non distended with positive bowel sounds. No rebound or guarding. Genitourinary: No CVA tenderness. Musculoskeletal:  No edema, cyanosis, or erythema of extremities. Neurologic: Normal speech and language. Face is symmetric. Moving all extremities. No gross focal neurologic deficits are appreciated. Skin: Skin is warm, dry and intact. No rash noted. Psychiatric: Mood and affect are normal. Speech and behavior are normal.  ED Results / Procedures / Treatments   Labs (all labs ordered are listed, but only abnormal results are displayed) Labs Reviewed  BASIC METABOLIC PANEL - Abnormal; Notable for the following components:      Result Value   Sodium 133 (*)    Potassium 3.0 (*)    Glucose, Bld 129 (*)    Creatinine, Ser 1.62 (*)    Calcium 8.0 (*)    GFR, Estimated 30 (*)    All other components within normal limits  CBC - Abnormal; Notable for the following components:   WBC 12.2 (*)    RBC 5.20 (*)    All other components within normal limits  CBG MONITORING, ED - Abnormal; Notable for the following components:   Glucose-Capillary 132 (*)    All other components within normal limits  RESP PANEL BY RT-PCR (FLU A&B, COVID) ARPGX2  CK  URINALYSIS, ROUTINE W REFLEX MICROSCOPIC  BRAIN  NATRIURETIC PEPTIDE     EKG  ED ECG REPORT I, Nita Sicklearolina Mariah Gerstenberger, the attending physician, personally viewed and interpreted this ECG.  Ventricular paced rhythm with no concordant ST elevations or depressions   RADIOLOGY I have personally reviewed the images performed during this visit and I agree with the Radiologist's read.   Interpretation by Radiologist:  DG Chest 2 View  Result Date: 10/15/2021 CLINICAL DATA:  Hypoxia EXAM: CHEST - 2 VIEW COMPARISON:  10/12/2021 FINDINGS: Lungs are clear. No pneumothorax or pleural effusion. Cardiac size within normal limits. Lead less pacemaker in place. Retained left subclavian pacemaker leads again noted within the right atrium and right ventricle. Pulmonary vascularity is normal. No acute bone abnormality. IMPRESSION: No active cardiopulmonary disease. Electronically Signed   By: Helyn NumbersAshesh  Parikh M.D.   On: 10/15/2021 03:51   CT Head Wo Contrast  Result Date: 10/15/2021 CLINICAL DATA:  Multiple falls, head injury, altered mental status EXAM: CT HEAD WITHOUT CONTRAST TECHNIQUE: Contiguous axial images were obtained from the base of the skull through the vertex without intravenous contrast. COMPARISON:  10/12/2021 FINDINGS: Brain: Normal anatomic configuration. Moderate parenchymal volume loss is commensurate with the patient's age. Moderate ventriculomegaly is again identified stable since prior examination. The degree of ventricular dilation appears asymmetric with the degree of parenchymal volume loss and while this may simply represent the sequela of central atrophy, changes related to normal pressure hydrocephalus should be considered. Advanced subcortical and periventricular periventricular white matter changes are present, similar to prior examination, likely reflecting the sequela of small vessel ischemia. No abnormal intra or extra-axial mass lesion or fluid collection. No abnormal mass effect or midline shift. No evidence of acute intracranial  hemorrhage or infarct. Advanced atrophic changes noted within the cerebellum. Vascular: No asymmetric hyperdense vasculature at the skull base. Skull: Intact Sinuses/Orbits: Paranasal sinuses are clear. Ocular lenses have been removed. Orbits are otherwise unremarkable. Other: Mastoid air cells and middle ear cavities are clear. IMPRESSION: No acute intracranial hemorrhage or infarct.  No calvarial fracture. Advanced parenchymal atrophy, similar to prior examination. Moderate ventriculomegaly, unchanged. While this may simply represent the sequela of central atrophy, correlation for signs and symptoms of normal pressure hydrocephalus may be helpful. Electronically Signed   By: Helyn NumbersAshesh  Parikh M.D.   On: 10/15/2021 04:07      PROCEDURES:  Critical Care performed: Yes, see critical care procedure note(s)  .Critical Care Performed by: Nita SickleVeronese, [place], MD Authorized by: Nita SickleVeronese, [place], MD   Critical care provider statement:    Critical care time (minutes):  30   Critical care  time was exclusive of:  Separately billable procedures and treating other patients   Critical care was necessary to treat or prevent imminent or life-threatening deterioration of the following conditions:  Respiratory failure, sepsis, shock, dehydration, circulatory failure and metabolic crisis   Critical care was time spent personally by me on the following activities:  Development of treatment plan with patient or surrogate, discussions with consultants, evaluation of patient's response to treatment, examination of patient, ordering and review of laboratory studies, ordering and review of radiographic studies, ordering and performing treatments and interventions, pulse oximetry, re-evaluation of patient's condition and review of old charts   I assumed direction of critical care for this patient from another provider in my specialty: no     Care discussed with: admitting provider      IMPRESSION / MDM / ASSESSMENT AND  PLAN / ED COURSE  I reviewed the triage vital signs and the nursing notes.  88 y.o. female with a history of CHF the EF of 40%, complete heart block status post pacemaker placement, normal pressure hydrocephalus, dementia, hypertension, chronic kidney disease who presents for evaluation of generalized weakness and confusion since having a fall 3 days ago.  Patient is well-appearing, neurologically intact but she is hypoxic.  She denies shortness of breath.  Her sats on room air have been between 88 and 93%.  She is moving good air and her lungs are clear to auscultation with no wheezing or crackles.  She looks euvolemic and has no pitting edema or elevated JVD, no asymmetric leg swelling.  History is gathered from her son and herself.  Ddx: With hypoxia and concern for pneumonia versus COVID versus flu versus pulmonary edema versus pneumothorax since she had a fall versus PE versus anemia.  For the weakness and the confusion that could be because of the hypoxia.  She is also complaining of dysuria so it could be from a UTI, also possible dehydration, electrolyte derangements, AKI, intracranial traumatic injury, malfunctioning pacemaker.   Plan: Head CT, chest x-ray, COVID and flu, urinalysis, pacemaker interrogation, CBC, chemistry panel, troponin, BNP, CK.  Patient placed on telemetry for close monitoring.  Patient on 3 L nasal cannula with sats in the mid 90s   MEDICATIONS GIVEN IN ED: Medications  iohexol (OMNIPAQUE) 350 MG/ML injection 60 mL (60 mLs Intravenous Contrast Given 10/15/21 0659)     ED COURSE: Imaging reviewed by me including head CT that is negative for intracranial injury.  Does show stable ventriculomegaly expected in the setting of normal pressure hydrocephalus, chest x-ray negative for any infiltrate.  All imaging was read by radiology.  COVID and flu negative.  Labs with slightly increased creatinine of 1.62 but stable GFR, mild hypokalemia and mild hypernatremia with no signs of  DKA.  Patient does have mild white count elevation of 12.2.  UA is pending.  CK showing no signs of rhabdo.  Pacemaker interrogation is pending.  With a negative x-ray and no other cause for patient's hypoxia we will pursue a CT angio to rule out a PE.  _________________________ 7:19 AM on 10/15/2021 ----------------------------------------- UA, pacer interrogation, CT angio of the chest are pending.  Care transferred to Dr. Scotty Court   Consults: none   EMR reviewed review patient's last visit to her primary care doctor on December 2022 where she was seen for management of her hypertension and constipation.  Based on her PCPs note, she has a history of recurrent UTIs.          FINAL CLINICAL  IMPRESSION(S) / ED DIAGNOSES   Final diagnoses:  Weakness  Confusion  Hypoxia     Rx / DC Orders   ED Discharge Orders     None        Note:  This document was prepared using Dragon voice recognition software and may include unintentional dictation errors.    Don PerkingVeronese, [place], MD 10/15/21 [phone]

## 2021-10-15 NOTE — Progress Notes (Signed)
Lab called to collect due labs.

## 2021-10-15 NOTE — ED Notes (Signed)
Daughter concerned about pt being discharged, Joni Fears MD notified.

## 2021-10-15 NOTE — ED Notes (Signed)
Pt is now up for admission  family states she is not able to walk at home

## 2021-10-15 NOTE — Discharge Instructions (Addendum)
Your tests today show a urinary tract infection.  Your CT scan of the chest was unremarkable.  Please follow-up with your primary care doctor in a week to ensure that your urinary tract infection has resolved and that you are feeling back to normal.  Your oxygen levels were a little bit lower than expected but still in the normal range.  Since you having no lung disease or respiratory symptoms, this is likely to be your normal chronic level.  Your doctor can recheck this when you follow up.

## 2021-10-15 NOTE — NC FL2 (Signed)
[place] LEVEL OF CARE SCREENING TOOL     IDENTIFICATION  Patient Name: Kerri [name] Birthdate: Feb 15, 1931 Sex: female Admission Date (Current Location): 10/15/2021  [facility] and [place] Number:  Engineering geologist and Address:  [facility], [address] [place], [place] [address]      Provider Number: [id number]  Attending Physician Name and Address:  Bernadette Hoit, DO  Relative Name and Phone Number:  Lekeisha Hochstrasser- daughter [phone]    Current Level of Care: Hospital Recommended Level of Care: [place] Prior Approval Number:    Date Approved/Denied:   PASRR Number: [id number]  Discharge Plan: SNF    Current Diagnoses: Patient Active Problem List   Diagnosis Date Noted   UTI (urinary tract infection) 10/15/2021   Leukocytosis 10/15/2021   Hypokalemia 10/15/2021   Hyponatremia 10/15/2021   Elevated brain natriuretic peptide (BNP) level 10/15/2021   Generalized weakness 10/15/2021   CKD (chronic kidney disease), stage III ([place]) 10/15/2021   Normal pressure hydrocephalus ([place]) 10/15/2021   Dementia ([place]) 10/15/2021   Hypothyroidism (acquired) 10/15/2021   Essential hypertension 10/15/2021   Overactive bladder 10/15/2021   CHB (complete heart block) ([place]) 01/07/2021   Sepsis ([place]) 05/01/2019    Orientation RESPIRATION BLADDER Height & Weight     Self, Place  O2 ([place] 2L) Incontinent Weight: 83.9 kg Height:  5\' 8"  (172.7 cm)  BEHAVIORAL SYMPTOMS/MOOD NEUROLOGICAL BOWEL NUTRITION STATUS      Continent Diet (see discharge summary)  AMBULATORY STATUS COMMUNICATION OF NEEDS Skin   Extensive Assist Verbally Normal                       Personal Care Assistance Level of Assistance  Bathing, Feeding, Dressing Bathing Assistance: Limited assistance Feeding assistance: Limited assistance Dressing Assistance: Limited assistance     Functional Limitations Info  Sight, Hearing, Speech Sight  Info: Adequate Hearing Info: Adequate Speech Info: Adequate    SPECIAL CARE FACTORS FREQUENCY  PT (By licensed PT), OT (By licensed OT)     PT Frequency: 5 times per week OT Frequency: 5 times per week            Contractures Contractures Info: Not present    Additional Factors Info  Code Status, Allergies Code Status Info: Full Allergies Info: Bupropion, rofecoxib,alendronate           Current Medications (10/15/2021):  This is the current hospital active medication list Current Facility-Administered Medications  Medication Dose Route Frequency Provider Last Rate Last Admin   acetaminophen (TYLENOL) tablet 650 mg  650 mg Oral Q6H PRN Adefeso, [place], DO       [START ON 10/16/2021] cefTRIAXone (ROCEPHIN) 1 g in sodium chloride 0.9 % 100 mL IVPB  1 g Intravenous Q24H Adefeso, Oladapo, DO       donepezil (ARICEPT) tablet 10 mg  10 mg Oral QHS Adefeso, Oladapo, DO       enoxaparin (LOVENOX) injection 30 mg  30 mg Subcutaneous Q24H Adefeso, Oladapo, DO       [START ON 10/16/2021] fesoterodine (TOVIAZ) tablet 4 mg  4 mg Oral Daily Adefeso, Oladapo, DO       [START ON 10/16/2021] levothyroxine (SYNTHROID) tablet 50 mcg  50 mcg Oral QAC breakfast Adefeso, Oladapo, DO       memantine (NAMENDA) tablet 10 mg  10 mg Oral QHS Adefeso, Oladapo, DO       metoprolol succinate (TOPROL-XL) 24 hr tablet 25 mg  25 mg  Oral Daily Bernadette Hoit, DO       Current Outpatient Medications  Medication Sig Dispense Refill   Calcium Carbonate-Vitamin D (CALCIUM-CARB 600 + D PO) Take 1 tablet by mouth daily.     cefdinir (OMNICEF) 300 MG capsule Take 1 capsule (300 mg total) by mouth 2 (two) times daily. 14 capsule 0   donepezil (ARICEPT) 10 MG tablet Take 10 mg by mouth at bedtime.     folic acid (FOLVITE) 1 MG tablet Take 1 mg by mouth daily.     levothyroxine (SYNTHROID) 50 MCG tablet Take 50 mcg by mouth daily before breakfast.     memantine (NAMENDA) 10 MG tablet Take 10 mg by mouth 2 (two) times  daily.     metoprolol succinate (TOPROL-XL) 50 MG 24 hr tablet Take 1 tablet (50 mg total) by mouth daily. Take with or immediately following a meal. (Patient taking differently: Take 25 mg by mouth daily. Take with or immediately following a meal.) 30 tablet 5   tolterodine (DETROL LA) 4 MG 24 hr capsule Take 4 mg by mouth daily.     venlafaxine XR (EFFEXOR-XR) 75 MG 24 hr capsule Take 75 mg by mouth daily with breakfast.      fluticasone (FLONASE) 50 MCG/ACT nasal spray Place 2 sprays into both nostrils daily. 10 mL 0   omega-3 acid ethyl esters (LOVAZA) 1 g capsule Take 1 g by mouth daily.  (Patient not taking: Reported on 10/15/2021)     raloxifene (EVISTA) 60 MG tablet Take 60 mg by mouth daily. (Patient not taking: Reported on 10/15/2021)     Vitamin D, Ergocalciferol, (DRISDOL) 1.25 MG (50000 UNIT) CAPS capsule Take 50,000 Units by mouth every 7 (seven) days.       Discharge Medications: Please see discharge summary for a list of discharge medications.  Relevant Imaging Results:  Relevant Lab Results:   Additional Information SS# [id number]  Shelbie Hutching, RN

## 2021-10-15 NOTE — H&P (Signed)
History and Physical  Kerri [name] [id number] DOB: [birth year] DOA: 10/15/2021  Referring physician:Stafford, Arnette Norris, MD PCP: Donnamarie Rossetti, PA-C  Patient coming from: Home  Chief Complaint: Generalized weakness  HPI: Kerri [name] is a 88 y.o. female with medical history significant for systolic CHF, hypertension, hypothyroidism, complete heart block s/p pacemaker, NPH, CKD stage 3B, dementia presents to the emergency department via EMS due to generalized weakness.  Patient sustained an unwitnessed fall on Monday (1/2) and was found by daughter on the floor, she has been more confused and weaker than normal since the fall, she complained of several days of burning sensation on urination, but denies fever, chills, cough, congestion, chest pain, shortness of breath, flank pain or abdominal pain.  ED Course:  In the emergency department, she was intermittently bradycardic, temperature 97.94F, temperature 145/70, O2 sat 92-98% on room air.  Work-up in the ED showed normal CBC except for leukocytosis, hyponatremia, hypokalemia, BUN/creatinine 23/1.62 (baseline creatinine at 1.2-1.4), BNP 398.2, urinalysis was positive for UTI.  Influenza A, B, SARS coronavirus was negative. Abdominal x-ray showed nonobstructive bowel gas pattern with mild to moderate volume of retained stool CT angiography chest with contrast was negative for acute pulmonary embolus but showed mild atelectasis. CT head without contrast showed no acute intracranial hemorrhage or infarct.  No calvarial fracture Chest x-ray showed no active cardiopulmonary disease Patient was treated with IV ceftriaxone, potassium was replenished.  She was going to be discharged from the ED, however, patient was noted to be unable to walk and needs an assistance to be able to stand, family confirmed that patient was unable to walk at home and that he would not be able to provide adequate assistance for her.  PT was consulted for  evaluation and it was confirmed that patient required assistance to stand and unable to safely walk any distance.  It was then decided for patient to be admitted for further evaluation and management.  Review of Systems: A full 10 point Review of Systems was done, except as stated above, all other Review of systems were negative.  Past Medical History:  Diagnosis Date   Arthritis    Past Surgical History:  Procedure Laterality Date   PACEMAKER LEADLESS INSERTION N/A 01/07/2021   Procedure: PACEMAKER LEADLESS INSERTION;  Surgeon: Isaias Cowman, MD;  Location: [facility];  Service: Cardiovascular;  Laterality: N/A;   PPM GENERATOR REMOVAL N/A 01/07/2021   Procedure: PPM GENERATOR REMOVAL;  Surgeon: Isaias Cowman, MD;  Location: [facility];  Service: Cardiovascular;  Laterality: N/A;    Social History:  reports that she has never smoked. She has never used smokeless tobacco. She reports that she does not drink alcohol and does not use drugs.   Allergies  Allergen Reactions   Bupropion Other (See Comments)    Tremors      Rofecoxib Diarrhea   Alendronate Rash    History reviewed. No pertinent family history.   Prior to Admission medications   Medication Sig Start Date End Date Taking? Authorizing Provider  cefdinir (OMNICEF) 300 MG capsule Take 1 capsule (300 mg total) by mouth 2 (two) times daily. 10/15/21  Yes Carrie Mew, MD  Calcium Carbonate-Vitamin D (CALCIUM-CARB 600 + D PO) Take 1 tablet by mouth daily.    [provider]  donepezil (ARICEPT) 10 MG tablet Take 10 mg by mouth at bedtime.    [provider]  fluticasone (FLONASE) 50 MCG/ACT nasal spray Place 2 sprays into both nostrils daily.  05/03/19 06/02/19  Otila Back, MD  folic acid (FOLVITE) 1 MG tablet Take 1 mg by mouth daily.    [provider]  levothyroxine (SYNTHROID) 50 MCG tablet Take 50 mcg by mouth daily before breakfast. 11/17/18   [provider]  memantine (NAMENDA) 10 MG tablet Take 10 mg by mouth 2 (two) times daily. 02/19/19   [provider]  metoprolol succinate (TOPROL-XL) 50 MG 24 hr tablet Take 1 tablet (50 mg total) by mouth daily. Take with or immediately following a meal. Patient taking differently: Take 25 mg by mouth daily. Take with or immediately following a meal. 01/08/21   Paraschos, Alexander, MD  omega-3 acid ethyl esters (LOVAZA) 1 g capsule Take 1 g by mouth daily.  04/30/19   [provider]  raloxifene (EVISTA) 60 MG tablet TAKE ONE TABLET EVERY DAY 06/28/18   [provider]  tolterodine (DETROL LA) 4 MG 24 hr capsule Take 4 mg by mouth daily.    [provider]  venlafaxine XR (EFFEXOR-XR) 75 MG 24 hr capsule Take 75 mg by mouth daily with breakfast.  03/09/19   [provider]  Vitamin D, Ergocalciferol, (DRISDOL) 1.25 MG (50000 UNIT) CAPS capsule Take 50,000 Units by mouth every 7 (seven) days.    [provider]    Physical Exam: BP (!) 149/74 (BP Location: Right Arm)    Pulse 96    Temp (!) 97.5 F (36.4 C) (Oral)    Resp 16    Ht 5\' 8"  (1.727 m)    Wt 83.9 kg    SpO2 94%    BMI 28.12 kg/m   General: 88 y.o. year-old female well developed well nourished in no acute distress.  Alert and oriented x3. HEENT: NCAT, EOMI Neck: Supple, trachea medial Cardiovascular: Regular rate and rhythm with no rubs or gallops.  No thyromegaly or JVD noted.  No lower extremity edema. 2/4 pulses in all 4 extremities. Respiratory: Clear to auscultation with no wheezes or rales. Good inspiratory effort. Abdomen: Soft, nontender nondistended with normal bowel sounds x4 quadrants. Muskuloskeletal: No cyanosis, clubbing or edema noted bilaterally Neuro: CN II-XII intact, sensation, reflexes intact Skin: No ulcerative lesions noted or rashes Psychiatry: Mood is appropriate for condition and setting          Labs on Admission:  Basic Metabolic Panel: Recent Labs   Lab 10/12/21 1628 10/14/21 1437  NA 140 133*  K 3.1* 3.0*  CL 106 99  CO2 23 25  GLUCOSE 231* 129*  BUN 17 23  CREATININE 1.35* 1.62*  CALCIUM 8.1* 8.0*   Liver Function Tests: No results for input(s): AST, ALT, ALKPHOS, BILITOT, PROT, ALBUMIN in the last 168 hours. No results for input(s): LIPASE, AMYLASE in the last 168 hours. No results for input(s): AMMONIA in the last 168 hours. CBC: Recent Labs  Lab 10/12/21 1628 10/14/21 1437  WBC 10.2 12.2*  HGB 13.4 14.3  HCT 41.7 44.4  MCV 86.3 85.4  PLT 177 178   Cardiac Enzymes: Recent Labs  Lab 10/14/21 1437  CKTOTAL 73    BNP (last 3 results) Recent Labs    10/15/21 0655  BNP 398.2*    ProBNP (last 3 results) No results for input(s): PROBNP in the last 8760 hours.  CBG: Recent Labs  Lab 10/14/21 1447  GLUCAP 132*    Radiological Exams on Admission: DG Chest 2 View  Result Date: 10/15/2021 CLINICAL DATA:  Hypoxia EXAM: CHEST - 2 VIEW COMPARISON:  10/12/2021 FINDINGS:  Lungs are clear. No pneumothorax or pleural effusion. Cardiac size within normal limits. Lead less pacemaker in place. Retained left subclavian pacemaker leads again noted within the right atrium and right ventricle. Pulmonary vascularity is normal. No acute bone abnormality. IMPRESSION: No active cardiopulmonary disease. Electronically Signed   By: Fidela Salisbury M.D.   On: 10/15/2021 03:51   DG Abdomen 1 View  Result Date: 10/15/2021 CLINICAL DATA:  88 year old female with constipation. EXAM: ABDOMEN - 1 VIEW COMPARISON:  CT Abdomen and Pelvis 01/12/2014. CTA chest 0705 hours today. FINDINGS: Portable AP supine view at 0712 hours. Excreted IV contrast in bilateral renal collecting systems. Non obstructed bowel gas pattern. Mild to moderate volume of retained stool in the colon, primarily the rectosigmoid. Degeneration in the lower lumbar spine. Chronic appearing pelvic pubic rami fractures appear stable since 2015. No acute osseous abnormality  identified. IMPRESSION: 1. Non obstructed bowel gas pattern with mild to moderate volume of retained stool. 2. Excreted IV contrast in the renal collecting systems. Electronically Signed   By: Genevie Ann M.D.   On: 10/15/2021 07:43   CT Head Wo Contrast  Result Date: 10/15/2021 CLINICAL DATA:  Multiple falls, head injury, altered mental status EXAM: CT HEAD WITHOUT CONTRAST TECHNIQUE: Contiguous axial images were obtained from the base of the skull through the vertex without intravenous contrast. COMPARISON:  10/12/2021 FINDINGS: Brain: Normal anatomic configuration. Moderate parenchymal volume loss is commensurate with the patient's age. Moderate ventriculomegaly is again identified stable since prior examination. The degree of ventricular dilation appears asymmetric with the degree of parenchymal volume loss and while this may simply represent the sequela of central atrophy, changes related to normal pressure hydrocephalus should be considered. Advanced subcortical and periventricular periventricular white matter changes are present, similar to prior examination, likely reflecting the sequela of small vessel ischemia. No abnormal intra or extra-axial mass lesion or fluid collection. No abnormal mass effect or midline shift. No evidence of acute intracranial hemorrhage or infarct. Advanced atrophic changes noted within the cerebellum. Vascular: No asymmetric hyperdense vasculature at the skull base. Skull: Intact Sinuses/Orbits: Paranasal sinuses are clear. Ocular lenses have been removed. Orbits are otherwise unremarkable. Other: Mastoid air cells and middle ear cavities are clear. IMPRESSION: No acute intracranial hemorrhage or infarct.  No calvarial fracture. Advanced parenchymal atrophy, similar to prior examination. Moderate ventriculomegaly, unchanged. While this may simply represent the sequela of central atrophy, correlation for signs and symptoms of normal pressure hydrocephalus may be helpful.  Electronically Signed   By: Fidela Salisbury M.D.   On: 10/15/2021 04:07   CT Angio Chest PE W and/or Wo Contrast  Result Date: 10/15/2021 CLINICAL DATA:  88 year old female with hypoxia and weakness. Recent falls. EXAM: CT ANGIOGRAPHY CHEST WITH CONTRAST TECHNIQUE: Multidetector CT imaging of the chest was performed using the standard protocol during bolus administration of intravenous contrast. Multiplanar CT image reconstructions and MIPs were obtained to evaluate the vascular anatomy. CONTRAST:  50mL OMNIPAQUE IOHEXOL 350 MG/ML SOLN COMPARISON:  Chest radiographs 0334 hours today. CT chest 01/31/2012. FINDINGS: Cardiovascular: Excellent contrast bolus timing in the pulmonary arterial tree. Minor respiratory motion. No focal filling defect identified in the pulmonary arteries to suggest acute pulmonary embolism. No contrast in the aorta. Calcified aortic atherosclerosis. Calcified coronary artery atherosclerosis. There are external cardiac pacemaker leads. No cardiomegaly or pericardial effusion. Mediastinum/Nodes: Negative. No mediastinal mass or lymphadenopathy. Lungs/Pleura: Major airways are patent. Lung volumes are similar to 2013. In the right costophrenic angle there is confluent but enhancing opacity most  suggestive of atelectasis. Mild dependent atelectasis in the left lower lobe. No pleural effusion or pulmonary inflammation identified. Upper Abdomen: A small hypodense area at the posterior right liver dome on series 4, image 81 is stable since 2013 and benign. Otherwise negative visible liver, spleen, right adrenal gland and stomach visible in the upper abdomen. Musculoskeletal: Dextroconvex thoracic scoliosis. No acute osseous abnormality identified. Review of the MIP images confirms the above findings. IMPRESSION: 1. Negative for acute pulmonary embolus. 2. Mild atelectasis.  No other acute pulmonary finding. 3. Calcified coronary artery and Aortic Atherosclerosis (ICD10-I70.0). Electronically  Signed   By: Genevie Ann M.D.   On: 10/15/2021 07:30    EKG: I independently viewed the EKG done and my findings are as followed: Sinus rhythm with complete heart block and ventricular paced rhythm with occasional PVCs  Assessment/Plan Present on Admission:  UTI (urinary tract infection)  CHB (complete heart block) (HCC)  Principal Problem:   UTI (urinary tract infection) Active Problems:   CHB (complete heart block) (HCC)   Leukocytosis   Hypokalemia   Hyponatremia   Elevated brain natriuretic peptide (BNP) level   Generalized weakness   CKD (chronic kidney disease), stage III (HCC)   Normal pressure hydrocephalus (HCC)   Dementia (HCC)   Hypothyroidism (acquired)   Generalized weakness secondary to UTI POA Patient was started on IV ceftriaxone, we shall continue same at this time Continue Tylenol as needed for fever Urinalysis was positive for UTI Urine culture pending Continue fall precaution and neurochecks Continue PT/OT eval and treat  Leukocytosis possibly secondary to above WBC 12.2, continue management as described above  Hypokalemia K+ is 3.0 K+ will be replenished Please monitor for AM K+ for further replenishmemnt  Hyponatremia Na 133, continue to monitor sodium levels  Elevated BNP rule out acute on chronic systolic CHF BNP [id number], this may not be reliable considering patient's chronic kidney status Continue total input/output, daily weights and fluid restriction Continue Cardiac diet  Echocardiogram in the morning   Essential hypertension Patient has no antihypertensive medication on med rec.  We shall await updated med rec  History of NPH Stable  Dementia Continue Aricept and Namenda  Hypothyroidism Continue Synthroid  Essential hypertension Continue Toprol XL  Overactive bladder Continue Toviaz  DVT prophylaxis: Lovenox  Code Status: Full code  Family Communication: None at bedside  Disposition Plan:  Patient is from:                         home Anticipated DC to:                   SNF or family members home Anticipated DC date:               2-3 days Anticipated DC barriers:          Patient requires inpatient management due to UTI requiring IV antibiotics   Consults called: None  Admission status: Inpatient   Bernadette Hoit MD [facility] 10/15/2021, 3:17 PM

## 2021-10-15 NOTE — TOC Initial Note (Signed)
Transition of Care [facility]) - Initial/Assessment Note    Patient Details  Name: Kerri [name] MRN: [id number] Date of Birth: 07/17/1931  Transition of Care [facility]) CM/SW Contact:    Shelbie Hutching, RN Phone Number: 10/15/2021, 3:23 PM  Clinical Narrative:                 Patient being admitted to the hospital for UTI.  Patient's daughter in law reports that all week patient has been getting weaker and weaker and just not acting like herself.  RNCM met with patient at the bedside, daughter in law and primary caregiver also at the bedside.  Daughter in law is with patient during the day but patient is at home alone at night.  She walks with a walker.  Patient is current with PCP.  Daughter in law also provides all transportation to medical appointments.  [facility] is [facility] with [facility]. PT consulted and is recommending SNF for short term rehab, family agrees with recommendation.  Patient has been to [facility] in the past but they no longer accept admissions.  Daughter would prefer [facility].   Bed search started.    Expected Discharge Plan: Skilled Nursing Facility Barriers to Discharge: Continued Medical Work up   Patient Goals and CMS Choice Patient states their goals for this hospitalization and ongoing recovery are:: FAmily would like to see patient go to short term rehab CMS Medicare.gov Compare Post Acute Care list provided to:: Patient Represenative (must comment) Choice offered to / list presented to : Adult Children  Expected Discharge Plan and Services Expected Discharge Plan: [place]   Discharge Planning Services: CM Consult Post Acute Care Choice: [place] Living arrangements for the past 2 months: Single Family Home                 DME Arranged: N/A DME Agency: NA       HH Arranged: NA HH Agency: NA        Prior Living Arrangements/Services Living arrangements for the past 2 months: Single Family Home Lives with::  Self Patient language and need for interpreter reviewed:: Yes Do you feel safe going back to the place where you live?: Yes      Need for Family Participation in Patient Care: Yes (Comment) (UTI) Care giver support system in place?: Yes (comment) (daughter and daughter in law) Current home services: DME Criminal Activity/Legal Involvement Pertinent to Current Situation/Hospitalization: No - Comment as needed  Activities of Daily Living      Permission Sought/Granted Permission sought to share information with : Case Manager, Family Supports, Customer service manager Permission granted to share information with : Yes, Verbal Permission Granted  Share Information with NAME: Christana Angelica  Permission granted to share info w AGENCY: SNF's  Permission granted to share info w Relationship: daughter  Permission granted to share info w Contact Information: [phone]  Emotional Assessment Appearance:: Appears stated age Attitude/Demeanor/Rapport: Engaged Affect (typically observed): Accepting Orientation: : Oriented to Self, Oriented to Place Alcohol / Substance Use: Not Applicable Psych Involvement: No (comment)  Admission diagnosis:  UTI (urinary tract infection) [N39.0] Patient Active Problem List   Diagnosis Date Noted   UTI (urinary tract infection) 10/15/2021   Leukocytosis 10/15/2021   Hypokalemia 10/15/2021   Hyponatremia 10/15/2021   Elevated brain natriuretic peptide (BNP) level 10/15/2021   Generalized weakness 10/15/2021   CKD (chronic kidney disease), stage III ([place]) 10/15/2021   Normal pressure hydrocephalus ([place]) 10/15/2021   Dementia (  [place]) 10/15/2021   Hypothyroidism (acquired) 10/15/2021   CHB (complete heart block) ([place]) 01/07/2021   Sepsis ([place]) 05/01/2019   PCP:  Donnamarie Rossetti, PA-C Pharmacy:   [id number] [id number]: [phone]: [phone] [id number]: [phone]: [phone]    Social Determinants of Health (SDOH) Interventions    Readmission Risk Interventions Readmission Risk Prevention Plan 05/01/2019  Medication Screening Complete  Transportation Screening Complete  Some recent data might be hidden

## 2021-10-15 NOTE — ED Notes (Signed)
Family requesting SW consult

## 2021-10-15 NOTE — ED Notes (Signed)
ED Provider at bedside. 

## 2021-10-15 NOTE — Evaluation (Signed)
Occupational Therapy Evaluation Patient Details Name: Kerri [name] MRN: [id number] DOB: [birth year] Today's Date: 10/15/2021   History of Present Illness Pt is an 88 y.o. female presenting to ED 1/4 with generalized weakness.  Pt with unwitnessed fall at home Monday morning and has become more confused and weaker than normal.  Pt noted to be hypoxic in ED.  PMH includes CHF, complete heart block s/p pacemaker placement, NPH, dementia, htn, CKD.   Clinical Impression   Pt was seen for OT evaluation this date. Pt alert and endorses feeling fatigue, oriented to self and demo's difficulty with recall of place, situation, and date. Pt reports desire to rest and initially appears mildly anxious. OT provided active listening and emotional support. Prior to hospital admission, pt reports being fairly independent with bathing, dressing, and toileting. She reports living with her son and DIL. No family present to verify PLOF/home set up. Most info pulled from chart review and PT evaluation earlier today when DIL was present. Currently pt demonstrates impairments in strength, activity tolerance, cognition, safety awareness, problem solving, and anticipate impaired balance as described below (See OT problem list) which functionally limit her ability to perform ADL/self-care tasks and increase her falls risk. Pt currently requires MOD A for bed level LB ADL, MIN A for UB ADL, and anticipate MIN-MOD A for ADL transfers per performance with PT earlier today. Pt would benefit from skilled OT services to address noted impairments and functional limitations (see below for any additional details) in order to maximize safety and independence while minimizing falls risk and caregiver burden. Upon hospital discharge, recommend STR to maximize pt safety and return to PLOF.     Recommendations for follow up therapy are one component of a multi-disciplinary discharge planning process, led by the attending physician.   Recommendations may be updated based on patient status, additional functional criteria and insurance authorization.   Follow Up Recommendations  Skilled nursing-short term rehab (<3 hours/day)    Assistance Recommended at Discharge Frequent or constant Supervision/Assistance  Patient can return home with the following A lot of help with walking and/or transfers;A lot of help with bathing/dressing/bathroom;Assistance with cooking/housework;Direct supervision/assist for medications management;Assist for transportation;Help with stairs or ramp for entrance    Functional Status Assessment  Patient has had a recent decline in their functional status and demonstrates the ability to make significant improvements in function in a reasonable and predictable amount of time.  Equipment Recommendations  None recommended by OT    Recommendations for Other Services       Precautions / Restrictions Precautions Precautions: Fall Restrictions Weight Bearing Restrictions: No      Mobility Bed Mobility Overal bed mobility: Needs Assistance    General bed mobility comments: Min A for rolling in bed, declines EOB 2/2 fatigue    Transfers          General transfer comment: declines 2/2 fatigue      Balance                            ADL either performed or assessed with clinical judgement   ADL Overall ADL's : Needs assistance/impaired                                       General ADL Comments: Pt currently requires MOD A for bed level LB ADL, MIN A for UB  ADL, and anticipate MIN-MOD A for ADL transfers per performance with PT earlier today     Vision         Perception     Praxis      Pertinent Vitals/Pain Pain Assessment: No/denies pain Faces Pain Scale: Hurts a little bit Pain Location: chronic R hip pain Pain Descriptors / Indicators: Discomfort Pain Intervention(s): Limited activity within patient's tolerance;Monitored during  session;Repositioned     Hand Dominance Right   Extremity/Trunk Assessment Upper Extremity Assessment Upper Extremity Assessment: Generalized weakness   Lower Extremity Assessment Lower Extremity Assessment: Generalized weakness   Cervical / Trunk Assessment Cervical / Trunk Assessment: Normal   Communication Communication Communication: No difficulties   Cognition Arousal/Alertness: Awake/alert Behavior During Therapy: WFL for tasks assessed/performed Overall Cognitive Status: No family/caregiver present to determine baseline cognitive functioning Area of Impairment: Following commands;Problem solving                       Following Commands: Follows one step commands inconsistently     Problem Solving: Slow processing;Decreased initiation;Difficulty sequencing;Requires verbal cues;Requires tactile cues General Comments: Oriented to person and eager to rest, difficulty with following simple commands     General Comments       Exercises     Shoulder Instructions      Home Living Family/patient expects to be discharged to:: Private residence Living Arrangements: Alone Available Help at Discharge: Family;Available PRN/intermittently Type of Home: House Home Access: Stairs to enter [employer] of Steps: 3 to front, garage is flat entrance Entrance Stairs-Rails: Can reach both Home Layout: One level;Laundry or work area in basement;Able to live on main level with bedroom/bathroom     Bathroom Shower/Tub: Walk-in shower         Home Equipment: Agricultural consultant (2 wheels);BSC/3in1;Cane - single point;Grab bars - tub/shower;Shower seat - built in   Additional Comments: Pt's daughter in law is with pt M-F 9am-7pm (assists pt as needed; walks behind pt); daughter comes to bring meals to pt on weekends (breakfast, lunch, and dinner) but does not stay rest of the day      Prior Functioning/Environment Prior Level of Function : Needs assist              Mobility Comments: Ambulatory with RW; 1 fall earlier this week but no other recent "falls" (pt has "slid" off of couch) ADLs Comments: no family present, pt reports indep with basic ADL, per chart frmo previous admission on 11/6, pt was performing basic self care mostly independently, DIL or dtr assisting with some bathing, pt does her own light meal prep and cleaning. Son/DIL/Dtr alternate driving/groceries        OT Problem List: Decreased knowledge of use of DME or AE;Decreased safety awareness;Decreased cognition;Decreased strength;Decreased activity tolerance;Impaired balance (sitting and/or standing)      OT Treatment/Interventions: Self-care/ADL training;Therapeutic exercise;Therapeutic activities;Energy conservation;DME and/or AE instruction;Patient/family education;Cognitive remediation/compensation;Balance training    OT Goals(Current goals can be found in the care plan section) Acute Rehab OT Goals Patient Stated Goal: get some rest and go home OT Goal Formulation: With patient Time For Goal Achievement: 10/29/21 Potential to Achieve Goals: Good ADL Goals Pt Will Perform Upper Body Dressing: with set-up;with supervision;sitting Pt Will Perform Lower Body Dressing: with min assist;sit to/from stand Pt Will Transfer to Toilet: with min assist;ambulating;bedside commode (LRAD PRN) Pt Will Perform Toileting - Clothing Manipulation and hygiene: with supervision;with set-up;sitting/lateral leans;sit to/from stand  OT Frequency: Min 2X/week    Co-evaluation  AM-PAC OT "6 Clicks" Daily Activity     Outcome Measure Help from another person eating meals?: None Help from another person taking care of personal grooming?: A Little Help from another person toileting, which includes using toliet, bedpan, or urinal?: A Lot Help from another person bathing (including washing, rinsing, drying)?: A Lot Help from another person to put on and taking off regular upper  body clothing?: A Little Help from another person to put on and taking off regular lower body clothing?: A Lot 6 Click Score: 16   End of Session Equipment Utilized During Treatment: Oxygen  Activity Tolerance: Patient limited by fatigue Patient left: in bed;with call bell/phone within reach  OT Visit Diagnosis: Other abnormalities of gait and mobility (R26.89);Muscle weakness (generalized) (M62.81)                Time: 4540-98111409-1420 OT Time Calculation (min): 11 min Charges:  OT General Charges $OT Visit: 1 Visit OT Evaluation $OT Eval Moderate Complexity: 1 Mod  Arman FilterJamie R., MPH, MS, OTR/L ascom [phone] 10/15/21, 3:12 PM

## 2021-10-16 DIAGNOSIS — N179 Acute kidney failure, unspecified: Secondary | ICD-10-CM | POA: Diagnosis present

## 2021-10-16 DIAGNOSIS — N3001 Acute cystitis with hematuria: Principal | ICD-10-CM

## 2021-10-16 DIAGNOSIS — N189 Chronic kidney disease, unspecified: Secondary | ICD-10-CM

## 2021-10-16 DIAGNOSIS — F0390 Unspecified dementia without behavioral disturbance: Secondary | ICD-10-CM

## 2021-10-16 LAB — CBC
HCT: 43.6 % (ref 36.0–46.0)
Hemoglobin: 14.1 g/dL (ref 12.0–15.0)
MCH: 27.4 pg (ref 26.0–34.0)
MCHC: 32.3 g/dL (ref 30.0–36.0)
MCV: 84.7 fL (ref 80.0–100.0)
Platelets: 209 10*3/uL (ref 150–400)
RBC: 5.15 MIL/uL — ABNORMAL HIGH (ref 3.87–5.11)
RDW: 14.6 % (ref 11.5–15.5)
WBC: 10.5 10*3/uL (ref 4.0–10.5)
nRBC: 0 % (ref 0.0–0.2)

## 2021-10-16 LAB — COMPREHENSIVE METABOLIC PANEL
ALT: 16 U/L (ref 0–44)
AST: 25 U/L (ref 15–41)
Albumin: 2.7 g/dL — ABNORMAL LOW (ref 3.5–5.0)
Alkaline Phosphatase: 70 U/L (ref 38–126)
Anion gap: 7 (ref 5–15)
BUN: 22 mg/dL (ref 8–23)
CO2: 30 mmol/L (ref 22–32)
Calcium: 8.3 mg/dL — ABNORMAL LOW (ref 8.9–10.3)
Chloride: 102 mmol/L (ref 98–111)
Creatinine, Ser: 1.14 mg/dL — ABNORMAL HIGH (ref 0.44–1.00)
GFR, Estimated: 46 mL/min — ABNORMAL LOW (ref >60)
Glucose, Bld: 123 mg/dL — ABNORMAL HIGH (ref 70–99)
Potassium: 3 mmol/L — ABNORMAL LOW (ref 3.5–5.1)
Sodium: 139 mmol/L (ref 135–145)
Total Bilirubin: 1.2 mg/dL (ref 0.3–1.2)
Total Protein: 6.8 g/dL (ref 6.5–8.1)

## 2021-10-16 LAB — MAGNESIUM: Magnesium: 2.2 mg/dL (ref 1.7–2.4)

## 2021-10-16 LAB — URINE CULTURE

## 2021-10-16 LAB — PHOSPHORUS: Phosphorus: 2.7 mg/dL (ref 2.5–4.6)

## 2021-10-16 LAB — APTT: aPTT: 38 seconds — ABNORMAL HIGH (ref 24–36)

## 2021-10-16 MED ORDER — POTASSIUM CHLORIDE CRYS ER 20 MEQ PO TBCR
40.0000 meq | EXTENDED_RELEASE_TABLET | Freq: Once | ORAL | Status: AC
  Administered 2021-10-16: 10:00:00 40 meq via ORAL
  Filled 2021-10-16: qty 2

## 2021-10-16 MED ORDER — FOLIC ACID 1 MG PO TABS
1.0000 mg | ORAL_TABLET | Freq: Every day | ORAL | Status: DC
  Administered 2021-10-16 – 2021-10-23 (×8): 1 mg via ORAL
  Filled 2021-10-16 (×8): qty 1

## 2021-10-16 MED ORDER — ENOXAPARIN SODIUM 40 MG/0.4ML IJ SOSY
40.0000 mg | PREFILLED_SYRINGE | INTRAMUSCULAR | Status: DC
  Administered 2021-10-16 – 2021-10-22 (×7): 40 mg via SUBCUTANEOUS
  Filled 2021-10-16 (×7): qty 0.4

## 2021-10-16 MED ORDER — VENLAFAXINE HCL ER 75 MG PO CP24
75.0000 mg | ORAL_CAPSULE | Freq: Every day | ORAL | Status: DC
  Administered 2021-10-16 – 2021-10-23 (×8): 75 mg via ORAL
  Filled 2021-10-16 (×8): qty 1

## 2021-10-16 MED ORDER — POLYETHYLENE GLYCOL 3350 17 G PO PACK
17.0000 g | PACK | Freq: Two times a day (BID) | ORAL | Status: DC
  Administered 2021-10-16 – 2021-10-17 (×3): 17 g via ORAL
  Filled 2021-10-16 (×3): qty 1

## 2021-10-16 MED ORDER — POTASSIUM CHLORIDE CRYS ER 20 MEQ PO TBCR
40.0000 meq | EXTENDED_RELEASE_TABLET | Freq: Once | ORAL | Status: AC
  Administered 2021-10-16: 15:00:00 40 meq via ORAL
  Filled 2021-10-16: qty 2

## 2021-10-16 NOTE — Progress Notes (Signed)
Occupational Therapy Treatment Patient Details Name: Kerri [name] MRN: [id number] DOB: 1931/02/02 Today's Date: 10/16/2021   History of present illness Pt is an 88 y.o. female presenting to ED 1/4 with generalized weakness.  Pt with unwitnessed fall at home Monday morning and has become more confused and weaker than normal.  Pt noted to be hypoxic in ED.  PMH includes CHF, complete heart block s/p pacemaker placement, NPH, dementia, htn, CKD.   OT comments  Ms. [place] presents with generalized weakness and impaired cognition today, and appears to have declined both physically and cognitively since yesterday's OT evaluation. Today she is not oriented to time, place, or situation; is unable to initiate or sequence tasks; and is unable to follow simple, one-step directions. She requires Max A for supine<>sit transfers, demonstrates poor sitting balance, is unable to participate in re-positioning in bed. She denies pain and does not demonstrate any signs of discomfort; in fact, although fatigued, she is cheerful and talkative and reports that she is eager to get back to feeding squirrels, which she says she has been doing this AM. Recommend continuing OT while patient is hospitalized, with DC to SNF.   Recommendations for follow up therapy are one component of a multi-disciplinary discharge planning process, led by the attending physician.  Recommendations may be updated based on patient status, additional functional criteria and insurance authorization.    Follow Up Recommendations  Skilled nursing-short term rehab (<3 hours/day)    Assistance Recommended at Discharge Frequent or constant Supervision/Assistance  Patient can return home with the following  A lot of help with walking and/or transfers;A lot of help with bathing/dressing/bathroom;Assistance with cooking/housework;Direct supervision/assist for medications management;Assist for transportation;Help with stairs or ramp for entrance    Equipment Recommendations  None recommended by OT    Recommendations for Other Services      Precautions / Restrictions Precautions Precautions: Fall Restrictions Weight Bearing Restrictions: No       Mobility Bed Mobility Overal bed mobility: Needs Assistance Bed Mobility: Supine to Sit;Sit to Supine     Supine to sit: Max assist Sit to supine: Max assist   General bed mobility comments: Requires Max A for supine<>sit, unable to sequence tasks or follow simple directions    Transfers                   General transfer comment: unable to come into standing     Balance Overall balance assessment: Needs assistance Sitting-balance support: Bilateral upper extremity supported;Feet supported Sitting balance-Leahy Scale: Poor Sitting balance - Comments: Requires Max A from therapist to maintain sitting balance Postural control: Posterior lean   Standing balance-Leahy Scale: Zero Standing balance comment: unable                           ADL either performed or assessed with clinical judgement   ADL Overall ADL's : Needs assistance/impaired   Eating/Feeding Details (indicate cue type and reason): Pt unable to open containers, unable to sequence feeding tasks.                                   General ADL Comments: Pt currently requires Mod/Max A for bed-level ADL    Extremity/Trunk Assessment Upper Extremity Assessment Upper Extremity Assessment: Generalized weakness   Lower Extremity Assessment Lower Extremity Assessment: Generalized weakness        Vision  Perception     Praxis      Cognition Arousal/Alertness: Awake/alert Behavior During Therapy: WFL for tasks assessed/performed Overall Cognitive Status: No family/caregiver present to determine baseline cognitive functioning Area of Impairment: Orientation;Awareness;Problem solving;Memory;Following commands;Safety/judgement                        Following Commands: Follows one step commands inconsistently     Problem Solving: Slow processing;Decreased initiation;Difficulty sequencing;Requires tactile cues;Requires verbal cues General Comments: oriented to self only. Pt states she is at home, has just been out feeding the squirrels and birds. Provides year as 64, month as "20."          Exercises Other Exercises Other Exercises: Educ: task sequencing, cognitive reorientation   Shoulder Instructions       General Comments small open sore on R ear    Pertinent Vitals/ Pain       Pain Assessment: No/denies pain  Home Living                                          Prior Functioning/Environment              Frequency  Min 2X/week        Progress Toward Goals  OT Goals(current goals can now be found in the care plan section)  Progress towards OT goals: Not progressing toward goals - comment  Acute Rehab OT Goals OT Goal Formulation: With patient Time For Goal Achievement: 10/29/21 Potential to Achieve Goals: Good  Plan Discharge plan remains appropriate;Frequency remains appropriate    Co-evaluation                 AM-PAC OT "6 Clicks" Daily Activity     Outcome Measure   Help from another person eating meals?: A Lot Help from another person taking care of personal grooming?: A Lot Help from another person toileting, which includes using toliet, bedpan, or urinal?: A Lot Help from another person bathing (including washing, rinsing, drying)?: A Lot Help from another person to put on and taking off regular upper body clothing?: A Lot Help from another person to put on and taking off regular lower body clothing?: A Lot 6 Click Score: 12    End of Session    OT Visit Diagnosis: Unsteadiness on feet (R26.81);Other abnormalities of gait and mobility (R26.89);Muscle weakness (generalized) (M62.81);Other symptoms and signs involving cognitive function   Activity Tolerance  Patient limited by fatigue   Patient Left in bed;with call bell/phone within reach;with chair alarm set   Nurse Communication          Time: 6962-9528 OT Time Calculation (min): 13 min  Charges: OT General Charges $OT Visit: 1 Visit OT Treatments $Self Care/Home Management : 8-22 mins  Latina Craver, PhD, MS, OTR/L 10/16/21, 1:40 PM

## 2021-10-16 NOTE — Progress Notes (Signed)
Patient ID: Kerri [name], female   DOB: 06-May-1931, 88 y.o.   MRN: [id number] [facility] PROGRESS NOTE  IKESHIA GUSSLER [id number] DOB: April 24, 1931 DOA: 10/15/2021 PCP: Donnamarie Rossetti, PA-C  HPI/Subjective: Patient states that she came in not feeling well that she is sick.  She states that her head was hurting and her stomach hurt.  Patient was admitted with weakness and urinary tract infection.  As per the patient's daughter, the patient has been getting weaker and weaker and can hardly even get up yesterday.  Normally she is able to walk with a walker and able to dress herself and feed herself.  Objective: Vitals:   10/16/21 0734 10/16/21 1201  BP: (!) 141/79 140/61  Pulse: (!) 59 60  Resp: 16 16  Temp: 98 F (36.7 C) 97.9 F (36.6 C)  SpO2: 92% 96%    Intake/Output Summary (Last 24 hours) at 10/16/2021 1325 Last data filed at 10/16/2021 0900 Gross per 24 hour  Intake 124.28 ml  Output --  Net 124.28 ml   Filed Weights   10/15/21 0811  Weight: 83.9 kg    ROS: Review of Systems  Respiratory:  Negative for shortness of breath.   Cardiovascular:  Negative for chest pain.  Gastrointestinal:  Negative for abdominal pain, nausea and vomiting.  Exam: Physical Exam HENT:     Head: Normocephalic.     Mouth/Throat:     Pharynx: No oropharyngeal exudate.  Eyes:     General: Lids are normal.     Conjunctiva/sclera: Conjunctivae normal.  Cardiovascular:     Rate and Rhythm: Normal rate and regular rhythm.     Heart sounds: Normal heart sounds, S1 normal and S2 normal.  Pulmonary:     Breath sounds: Normal breath sounds. No decreased breath sounds, wheezing, rhonchi or rales.  Abdominal:     Palpations: Abdomen is soft.     Tenderness: There is no abdominal tenderness.  Musculoskeletal:     Right lower leg: No swelling.     Left lower leg: No swelling.  Skin:    General: Skin is warm.     Findings: No rash.  Neurological:     Mental Status: She is  alert.     Comments: Able to straight leg raise to command.  Patient answers most questions appropriately.  Does not remember what she ate for breakfast.      Scheduled Meds:  donepezil  10 mg Oral QHS   enoxaparin (LOVENOX) injection  40 mg Subcutaneous Q24H   fesoterodine  4 mg Oral Daily   folic acid  1 mg Oral Daily   levothyroxine  50 mcg Oral QAC breakfast   memantine  10 mg Oral QHS   metoprolol succinate  25 mg Oral Daily   venlafaxine XR  75 mg Oral Q breakfast   Continuous Infusions:  cefTRIAXone (ROCEPHIN)  IV 60 mL/hr at 10/16/21 0602    Assessment/Plan:  Generalized weakness secondary to urinary tract infection.  Continue working with physical therapy.  Physical therapy recommending rehab at this point.  Transitional care team to look into options. Acute cystitis with hematuria, leukocytosis.  Urine culture shows multiple species and suggest repeat collection.  Empirically on Rocephin.  Will order another urine culture.  White blood cell count has come down from 12.2 down to 10.5. Acute kidney injury on chronic kidney disease stage IIIa.  Creatinine improved from 1.62 down to 1.14 with IV fluid hydration. Hypokalemia.  Replace potassium this morning and will give  another dose this afternoon and recheck in the morning Essential hypertension started on metoprolol Elevated BNP.  No signs of heart failure.  Admitting physician ordered an echocardiogram.  History of heart block and pacer. Constipation we will give MiraLAX twice daily until bowel movement and then cut back to as needed. Dementia with normal pressure hydrocephalus. Depression on Effexor Hypothyroidism unspecified on Synthroid Initial hyponatremia with sodium of 133.  Today's sodium 139.      Code Status:     Code Status Orders  (From admission, onward)           Start     Ordered   10/15/21 1458  Full code  Continuous        10/15/21 1457           Code Status History     Date Active  Date Inactive Code Status Order ID Comments User Context   01/07/2021 1220 01/08/2021 1601 Full Code [id number]  Isaias Cowman, MD Inpatient   05/01/2019 1332 05/03/2019 1822 DNR [id number]  Otila Back, MD Inpatient   05/01/2019 0230 05/01/2019 1332 Full Code [id number]  Mayer Camel, NP ED      Family Communication: Spoke with daughter at the bedside Disposition Plan: Status is: Inpatient  Antibiotics: Rocephin  Yasira Engelson [employer]  [facility]

## 2021-10-16 NOTE — Progress Notes (Deleted)
Patient transported to CT 

## 2021-10-17 ENCOUNTER — Inpatient Hospital Stay (HOSPITAL_COMMUNITY)
Admit: 2021-10-17 | Discharge: 2021-10-17 | Disposition: A | Discharge status: Home / Self Care | Payer: Medicare Other | Attending: Internal Medicine | Admitting: Internal Medicine

## 2021-10-17 DIAGNOSIS — I5021 Acute systolic (congestive) heart failure: Secondary | ICD-10-CM

## 2021-10-17 LAB — ECHOCARDIOGRAM COMPLETE
AV Peak grad: 4.9 mmHg
Ao pk vel: 1.11 m/s
Area-P 1/2: 2.24 cm2
Height: 68 in
S' Lateral: 3.3 cm
Single Plane A4C EF: 52.4 %
Weight: 2747.81 oz

## 2021-10-17 LAB — BASIC METABOLIC PANEL
Anion gap: 7 (ref 5–15)
BUN: 19 mg/dL (ref 8–23)
CO2: 25 mmol/L (ref 22–32)
Calcium: 8.3 mg/dL — ABNORMAL LOW (ref 8.9–10.3)
Chloride: 107 mmol/L (ref 98–111)
Creatinine, Ser: 0.92 mg/dL (ref 0.44–1.00)
GFR, Estimated: 60 mL/min — ABNORMAL LOW (ref >60)
Glucose, Bld: 124 mg/dL — ABNORMAL HIGH (ref 70–99)
Potassium: 3.5 mmol/L (ref 3.5–5.1)
Sodium: 139 mmol/L (ref 135–145)

## 2021-10-17 MED ORDER — POLYETHYLENE GLYCOL 3350 17 G PO PACK
17.0000 g | PACK | Freq: Every day | ORAL | Status: DC
  Administered 2021-10-18 – 2021-10-23 (×6): 17 g via ORAL
  Filled 2021-10-17 (×6): qty 1

## 2021-10-17 MED ORDER — FLUCONAZOLE 100 MG PO TABS
100.0000 mg | ORAL_TABLET | Freq: Every day | ORAL | Status: DC
  Administered 2021-10-17 – 2021-10-23 (×7): 100 mg via ORAL
  Filled 2021-10-17 (×7): qty 1

## 2021-10-17 NOTE — Progress Notes (Addendum)
Patient ID: RONNISHA TATGE, female   DOB: [birth year], 88 y.o.   MRN: [id number] [facility] PROGRESS NOTE  ZAMONI BIEDENBACH [id number] DOB: [birth year] DOA: 10/15/2021 PCP: Donnamarie Rossetti, PA-C  HPI/Subjective: Patient feels okay.  Offers no complaints.  Slept well.  Able to answer questions and follow simple commands.  Admitted with weakness and acute cystitis.  Objective: Vitals:   10/17/21 1033 10/17/21 1205  BP:  125/67  Pulse: 62 (!) 59  Resp:  16  Temp:  (!) 97.5 F (36.4 C)  SpO2:  97%    Intake/Output Summary (Last 24 hours) at 10/17/2021 1357 Last data filed at 10/17/2021 0453 Gross per 24 hour  Intake 600 ml  Output 500 ml  Net 100 ml   Filed Weights   10/15/21 0811 10/16/21 0500 10/17/21 0500  Weight: 83.9 kg 83.9 kg 77.9 kg    ROS: Review of Systems  Respiratory:  Negative for shortness of breath.   Cardiovascular:  Negative for chest pain.  Gastrointestinal:  Negative for abdominal pain, nausea and vomiting.  Exam: Physical Exam HENT:     Head: Normocephalic.     Mouth/Throat:     Pharynx: No oropharyngeal exudate.  Eyes:     General: Lids are normal.     Conjunctiva/sclera: Conjunctivae normal.  Cardiovascular:     Rate and Rhythm: Normal rate and regular rhythm.     Heart sounds: Normal heart sounds, S1 normal and S2 normal.  Pulmonary:     Breath sounds: No decreased breath sounds, wheezing, rhonchi or rales.  Abdominal:     Palpations: Abdomen is soft.     Tenderness: There is no abdominal tenderness.  Musculoskeletal:     Right ankle: No swelling.     Left ankle: No swelling.  Neurological:     Mental Status: She is alert.     Comments: Answers question simple questions appropriately.  Able to straight leg raise to command      Scheduled Meds:  donepezil  10 mg Oral QHS   enoxaparin (LOVENOX) injection  40 mg Subcutaneous Q24H   fesoterodine  4 mg Oral Daily   fluconazole  100 mg Oral Daily   folic acid  1 mg Oral Daily    levothyroxine  50 mcg Oral QAC breakfast   memantine  10 mg Oral QHS   metoprolol succinate  25 mg Oral Daily   polyethylene glycol  17 g Oral BID   venlafaxine XR  75 mg Oral Q breakfast   Continuous Infusions:  cefTRIAXone (ROCEPHIN)  IV 1 g (10/17/21 0543)    Assessment/Plan:  Generalized weakness secondary to acute cystitis with hematuria.  Physical therapy currently recommending rehab. Acute cystitis with hematuria and leukocytosis.  First urine culture shows multiple species.  Second urine culture still pending.  Continue Rocephin.  White blood cell count normalized yesterday. Acute kidney injury on chronic kidney disease stage IIIa.  Creatinine improved from 1.62 down to 0.92 today. Hypokalemia replaced into normal range Essential hypertension on metoprolol Elevated BNP.  No signs of heart failure.  Echocardiogram done but not read yet Constipation.  Had 2 bowel movements.  Will change to daily MiraLAX. Dementia with normal pressure hydrocephalus Depression on Effexor Hypothyroidism unspecified on Synthroid Hyponatremia on presentation has resolved        Code Status:     Code Status Orders  (From admission, onward)           Start     Ordered   10/15/21  1458  Full code  Continuous        10/15/21 1457           Code Status History     Date Active Date Inactive Code Status Order ID Comments User Context   01/07/2021 1220 01/08/2021 1601 Full Code [id number]  Isaias Cowman, MD Inpatient   05/01/2019 1332 05/03/2019 1822 DNR [id number]  Otila Back, MD Inpatient   05/01/2019 0230 05/01/2019 1332 Full Code [id number]  Mayer Camel, NP ED      Family Communication: Updated patient's daughter on the phone Disposition Plan: Status is: [place]  [facility]

## 2021-10-17 NOTE — Progress Notes (Signed)
*  PRELIMINARY RESULTS* Echocardiogram 2D Echocardiogram has been performed.  Kerri [name] 10/17/2021, 2:58 PM

## 2021-10-17 NOTE — TOC Progression Note (Incomplete)
Transition of Care [facility]) - Progression Note    Patient Details  Name: Kerri [name] MRN: [id number] Date of Birth: Aug 07, 1931  [facility] CM/SW Contact  Izola Price, RN Phone Number: 10/17/2021, 10:27 AM  Clinical Narrative:  1/7:     Expected Discharge Plan: [place] Barriers to Discharge: Continued Medical Work up  Expected Discharge Plan and Services Expected Discharge Plan: [place]   Discharge Planning Services: CM Consult Post Acute Care Choice: [place] Living arrangements for the past 2 months: Single Family Home                 DME Arranged: N/A DME Agency: NA       HH Arranged: NA HH Agency: NA         Social Determinants of Health (SDOH) Interventions    Readmission Risk Interventions Readmission Risk Prevention Plan 05/01/2019  Medication Screening Complete  Transportation Screening Complete  Some recent data might be hidden

## 2021-10-18 DIAGNOSIS — K5900 Constipation, unspecified: Secondary | ICD-10-CM

## 2021-10-18 LAB — URINE CULTURE
Culture: NO GROWTH
Special Requests: NORMAL

## 2021-10-18 MED ORDER — CEPHALEXIN 500 MG PO CAPS
500.0000 mg | ORAL_CAPSULE | Freq: Three times a day (TID) | ORAL | Status: DC
  Administered 2021-10-19 – 2021-10-20 (×5): 500 mg via ORAL
  Filled 2021-10-18 (×5): qty 1

## 2021-10-18 MED ORDER — LOSARTAN POTASSIUM 25 MG PO TABS
12.5000 mg | ORAL_TABLET | Freq: Every day | ORAL | Status: DC
  Administered 2021-10-18 – 2021-10-19 (×2): 12.5 mg via ORAL
  Filled 2021-10-18 (×2): qty 1

## 2021-10-18 NOTE — Progress Notes (Signed)
Patient ID: Kerri [name], female   DOB: [birth year], 88 y.o.   MRN: [id number] [facility] PROGRESS NOTE  TIJERA ALLAMAN [id number] DOB: 10-31-1930 DOA: 10/15/2021 PCP: Grayland Ormond Hestle, PA-C  HPI/Subjective: Patient feels well and offers no complaints.  Still feeling weak.  Objective: Vitals:   10/18/21 0822 10/18/21 1137  BP: (!) 151/75 (!) 144/73  Pulse: 63 63  Resp: 16 17  Temp: 98.1 F (36.7 C) 97.9 F (36.6 C)  SpO2: 96% 96%    Intake/Output Summary (Last 24 hours) at 10/18/2021 1523 Last data filed at 10/18/2021 [id number] Gross per 24 hour  Intake 193.09 ml  Output 600 ml  Net -406.91 ml   Filed Weights   10/15/21 0811 10/16/21 0500 10/17/21 0500  Weight: 83.9 kg 83.9 kg 77.9 kg    ROS: Review of Systems  Respiratory:  Negative for shortness of breath.   Cardiovascular:  Negative for chest pain.  Gastrointestinal:  Negative for abdominal pain, nausea and vomiting.  Exam: Physical Exam HENT:     Head: Normocephalic.     Mouth/Throat:     Pharynx: No oropharyngeal exudate.  Eyes:     General: Lids are normal.     Conjunctiva/sclera: Conjunctivae normal.  Cardiovascular:     Rate and Rhythm: Normal rate and regular rhythm.     Heart sounds: Normal heart sounds, S1 normal and S2 normal.  Pulmonary:     Breath sounds: Examination of the right-lower field reveals decreased breath sounds. Examination of the left-lower field reveals decreased breath sounds. Decreased breath sounds present. No wheezing, rhonchi or rales.  Abdominal:     Palpations: Abdomen is soft.     Tenderness: There is no abdominal tenderness.  Musculoskeletal:     Right lower leg: No swelling.     Left lower leg: No swelling.  Neurological:     Mental Status: She is alert.     Comments: Answer some simple questions and follow some simple commands.      Scheduled Meds:  donepezil  10 mg Oral QHS   enoxaparin (LOVENOX) injection  40 mg Subcutaneous Q24H   fesoterodine  4 mg  Oral Daily   fluconazole  100 mg Oral Daily   folic acid  1 mg Oral Daily   levothyroxine  50 mcg Oral QAC breakfast   losartan  12.5 mg Oral QHS   memantine  10 mg Oral QHS   metoprolol succinate  25 mg Oral Daily   polyethylene glycol  17 g Oral Daily   venlafaxine XR  75 mg Oral Q breakfast   Continuous Infusions:  cefTRIAXone (ROCEPHIN)  IV Stopped (10/18/21 0529)    Assessment/Plan:  Generalized weakness secondary to acute cystitis with hematuria.  Physical therapy currently recommending rehab, awaiting reevaluation.  No bed offers as of Sunday. Acute cystitis with hematuria and leukocytosis.  First urine culture shows multiple species.  Second urine culture negative.  We will switch Rocephin over to Keflex.  White blood cell count normalized yesterday. Acute kidney injury on chronic kidney disease stage IIIa.  Creatinine improved from 1.62 down to 0.92. Elevated BNP.  Echocardiogram shows an EF of 45 to 50%.  Toprol-XL started the other day.  Add losartan today.  Still currently no signs of heart failure. Constipation improved with MiraLAX Dementia with normal pressure hydrocephalus Depression on Effexor Hypothyroidism unspecified on Synthroid Hyponatremia on presentation.  This is resolved.        Code Status:     Code Status Orders  (  From admission, onward)           Start     Ordered   10/15/21 1458  Full code  Continuous        10/15/21 1457           Code Status History     Date Active Date Inactive Code Status Order ID Comments User Context   01/07/2021 1220 01/08/2021 1601 Full Code [id number]  Isaias Cowman, MD Inpatient   05/01/2019 1332 05/03/2019 1822 DNR [id number]  Otila Back, MD Inpatient   05/01/2019 0230 05/01/2019 1332 Full Code [id number]  Mayer Camel, NP ED      Family Communication: Updated patient's daughter on the Disposition Plan: Status is: Inpatient  Antibiotics: Switch Rocephin over to [place]

## 2021-10-19 NOTE — Progress Notes (Signed)
Physical Therapy Treatment Patient Details Name: Kerri [name] MRN: [id number] DOB: Oct 26, 1930 Today's Date: 10/19/2021   History of Present Illness Pt is an 88 y.o. female presenting to ED 1/4 with generalized weakness.  Pt with unwitnessed fall at home Monday morning and has become more confused and weaker than normal.  Pt noted to be hypoxic in ED.  PMH includes CHF, complete heart block s/p pacemaker placement, NPH, dementia, htn, CKD.    PT Comments    Pt resting in bed upon PT arrival; agreeable to PT session and eager to get OOB.  Semi-supine to sitting edge of bed min assist (increased effort/time for pt to perform as much as possible on own); min assist to stand from bed up to RW; min assist initially to take steps bed to recliner with RW but pt suddenly sat early (even though therapist specifically told pt NOT to sit yet) requiring mod assist to safely sit down in recliner (pt reporting she heard therapist but was too fatigued to take any more steps so she sat down).  After sitting rest break, performed x5 sit to stand from recliner up to RW (min assist x4 trials and mod assist last trial d/t pt fatiguing).  No c/o pain during session.  Will continue to focus on strengthening, endurance, and progressive functional mobility per pt tolerance.    Recommendations for follow up therapy are one component of a multi-disciplinary discharge planning process, led by the attending physician.  Recommendations may be updated based on patient status, additional functional criteria and insurance authorization.  Follow Up Recommendations  Skilled nursing-short term rehab (<3 hours/day)     Assistance Recommended at Discharge Frequent or constant Supervision/Assistance  Patient can return home with the following Two people to help with walking and/or transfers;A lot of help with bathing/dressing/bathroom;Assistance with cooking/housework;Direct supervision/assist for medications management;Assist for  transportation;Direct supervision/assist for financial management;Help with stairs or ramp for entrance   Equipment Recommendations  Rolling walker (2 wheels);BSC/3in1;Wheelchair (measurements PT);Wheelchair cushion (measurements PT)    Recommendations for Other Services OT consult     Precautions / Restrictions Precautions Precautions: Fall Restrictions Weight Bearing Restrictions: No     Mobility  Bed Mobility Overal bed mobility: Needs Assistance Bed Mobility: Supine to Sit     Supine to sit: Min assist;HOB elevated     General bed mobility comments: assist for trunk; increased effort and time for pt to perform as much as possible on own; heavy use of bed rails    Transfers Overall transfer level: Needs assistance Equipment used: Rolling walker (2 wheels) Transfers: Sit to/from Stand Sit to Stand: Min assist;Mod assist           General transfer comment: x1 trial standing from bed; x5 trials standing from recliner (4 trials with min assist and last trial with mod assist); vc's for UE/LE placement    Ambulation/Gait Ambulation/Gait assistance: Min assist;Mod assist Gait Distance (Feet): 3 Feet (bed to recliner) Assistive device: Rolling walker (2 wheels)   Gait velocity: decreased     General Gait Details: increased effort/time to take steps; vc's to take bigger steps (decreased B LE step length/foot clearance)   Stairs             Wheelchair Mobility    Modified Rankin (Stroke Patients Only)       Balance Overall balance assessment: Needs assistance Sitting-balance support: No upper extremity supported;Feet supported Sitting balance-Leahy Scale: Good Sitting balance - Comments: steady sitting reaching within BOS   Standing  balance support: Single extremity supported Standing balance-Leahy Scale: Fair Standing balance comment: vc's for upright posture; steady standing with single UE support                            Cognition  Arousal/Alertness: Awake/alert Behavior During Therapy: WFL for tasks assessed/performed Overall Cognitive Status: No family/caregiver present to determine baseline cognitive functioning Area of Impairment: Orientation;Awareness;Problem solving;Memory;Following commands;Safety/judgement                 Orientation Level: Disoriented to;Time   Memory: Decreased recall of precautions Following Commands: Follows one step commands inconsistently Safety/Judgement: Decreased awareness of safety;Decreased awareness of deficits Awareness: Anticipatory Problem Solving: Slow processing;Decreased initiation;Difficulty sequencing;Requires verbal cues;Requires tactile cues General Comments: Oriented to person, general place, and general situation.  Unable to provide any part of date.        Exercises      General Comments General comments (skin integrity, edema, etc.): small sore noted on R ear.  Nursing cleared pt for participation in physical therapy.  Pt agreeable to PT session.      Pertinent Vitals/Pain Pain Assessment: No/denies pain Pain Intervention(s): Limited activity within patient's tolerance;Monitored during session;Repositioned Vitals (HR and O2 on room air) stable and WFL throughout treatment session.    Home Living                          Prior Function            PT Goals (current goals can now be found in the care plan section) Acute Rehab PT Goals Patient Stated Goal: to improve walking PT Goal Formulation: With patient Time For Goal Achievement: 10/29/21 Potential to Achieve Goals: Good Progress towards PT goals: Progressing toward goals    Frequency    Min 2X/week      PT Plan Current plan remains appropriate    Co-evaluation              AM-PAC PT "6 Clicks" Mobility   Outcome Measure  Help needed turning from your back to your side while in a flat bed without using bedrails?: A Little Help needed moving from lying on your  back to sitting on the side of a flat bed without using bedrails?: A Little Help needed moving to and from a bed to a chair (including a wheelchair)?: A Little Help needed standing up from a chair using your arms (e.g., wheelchair or bedside chair)?: A Little Help needed to walk in hospital room?: Total Help needed climbing 3-5 steps with a railing? : Total 6 Click Score: 14    End of Session Equipment Utilized During Treatment: Gait belt Activity Tolerance: Patient limited by fatigue Patient left: in chair;with call bell/phone within reach;with chair alarm set Nurse Communication: Mobility status;Precautions PT Visit Diagnosis: Muscle weakness (generalized) (M62.81);History of falling (Z91.81);Difficulty in walking, not elsewhere classified (R26.2)     Time: 3557-3220 PT Time Calculation (min) (ACUTE ONLY): 28 min  Charges:  $Therapeutic Activity: 23-37 mins                    Hendricks Limes, PT 10/19/21, 10:13 AM

## 2021-10-19 NOTE — Progress Notes (Signed)
Patient ID: Kerri [name], female   DOB: Mar 02, 1931, 88 y.o.   MRN: [id number] [facility] PROGRESS NOTE  Kerri [name] [id number] DOB: 11-May-1931 DOA: 10/15/2021 PCP: Grayland Ormond Hestle, PA-C  HPI/Subjective: Patient only able to walk about 5 steps with physical therapy.  Physical therapy still recommending rehab.  Patient offers no complaints.  I did set up her breakfast this morning.  Objective: Vitals:   10/19/21 0748 10/19/21 1128  BP: 137/63 127/68  Pulse: (!) 59 (!) 59  Resp: 18 16  Temp: 97.8 F (36.6 C) (!) 96.7 F (35.9 C)  SpO2: 95% 96%    Intake/Output Summary (Last 24 hours) at 10/19/2021 1546 Last data filed at 10/19/2021 1300 Gross per 24 hour  Intake 600 ml  Output 1150 ml  Net -550 ml   Filed Weights   10/16/21 0500 10/17/21 0500 10/19/21 0500  Weight: 83.9 kg 77.9 kg 78.6 kg    ROS: Review of Systems  Respiratory:  Negative for shortness of breath.   Cardiovascular:  Negative for chest pain.  Gastrointestinal:  Negative for abdominal pain, nausea and vomiting.  Exam: Physical Exam HENT:     Head: Normocephalic.     Mouth/Throat:     Pharynx: No oropharyngeal exudate.  Eyes:     General: Lids are normal.     Conjunctiva/sclera: Conjunctivae normal.  Cardiovascular:     Rate and Rhythm: Normal rate and regular rhythm.     Heart sounds: Normal heart sounds, S1 normal and S2 normal.  Pulmonary:     Breath sounds: Normal breath sounds. No decreased breath sounds, wheezing, rhonchi or rales.  Abdominal:     Palpations: Abdomen is soft.     Tenderness: There is no abdominal tenderness.  Musculoskeletal:     Right lower leg: No swelling.     Left lower leg: No swelling.  Neurological:     Mental Status: She is alert.     Comments: Answer some simple questions appropriately.  Able to straight leg raise.      Scheduled Meds:  cephALEXin  500 mg Oral Q8H   donepezil  10 mg Oral QHS   enoxaparin (LOVENOX) injection  40 mg  Subcutaneous Q24H   fesoterodine  4 mg Oral Daily   fluconazole  100 mg Oral Daily   folic acid  1 mg Oral Daily   levothyroxine  50 mcg Oral QAC breakfast   losartan  12.5 mg Oral QHS   memantine  10 mg Oral QHS   metoprolol succinate  25 mg Oral Daily   polyethylene glycol  17 g Oral Daily   venlafaxine XR  75 mg Oral Q breakfast     Assessment/Plan:  Generalized weakness secondary to acute cystitis with hematuria.  Physical therapy still recommending rehab.  No bed offers as of when we did rounds this morning. Acute cystitis with hematuria and leukocytosis.  First urine culture showed multiple species.  Second urine culture negative.  Rocephin switched over to Keflex. Acute kidney injury on chronic kidney disease stage IIIa.  Creatinine improved from 1.62 down to 0.92. Elevated BNP and echocardiogram showing an EF of 45 to 50%.  On Toprol-XL and low-dose losartan.  No signs of heart failure. Constipation improved with MiraLAX Dementia with normal pressure hydrocephalus Depression on Effexor Hypothyroidism unspecified.  Continue Synthroid Hyponatremia on presentation.  This has resolved.     Code Status:     Code Status Orders  (From admission, onward)  Start     Ordered   10/15/21 1458  Full code  Continuous        10/15/21 1457           Code Status History     Date Active Date Inactive Code Status Order ID Comments User Context   01/07/2021 1220 01/08/2021 1601 Full Code [id number]  Isaias Cowman, MD Inpatient   05/01/2019 1332 05/03/2019 1822 DNR [id number]  Otila Back, MD Inpatient   05/01/2019 0230 05/01/2019 1332 Full Code [id number]  Mayer Camel, NP ED      Family Communication: Updated patient's daughter on the phone Disposition Plan: Status is: Inpatient.  Out to rehab once bed obtained, case discussed with transitional care team.  Loletha Grayer  [facility]

## 2021-10-19 NOTE — Progress Notes (Signed)
Occupational Therapy Treatment Patient Details Name: Kerri [name] MRN: [id number] DOB: February 04, 1931 Today's Date: 10/19/2021   History of present illness Pt is an 88 y.o. female presenting to ED 1/4 with generalized weakness.  Pt with unwitnessed fall at home Monday morning and has become more confused and weaker than normal.  Pt noted to be hypoxic in ED.  PMH includes CHF, complete heart block s/p pacemaker placement, NPH, dementia, htn, CKD.   OT comments  Ms. Harnois continues to present with generalized weakness, impaired balance, and cognitive deficits that impact her ability to safely and independently complete ADLs.  OT provided mod assist for sit to stand transfer x4 during session using rolling walker.  Pt also required mod verbal cues for technique and sequencing to support transfers required for ADL engagement.  OT provided min verbal cues and setup assist for initiation and sequencing for pt to complete grooming tasks while seated.  Pt required mod assist for balance while taking 3 steps forward with rolling walker, but returned to seat due to pt c/o dizziness and headache.  Symptoms quickly recovered with rest.  Pt was oriented to self and family, though was disoriented to date and situation.  She will continue to benefit from skilled OT services in acute setting to support functional strengthening, balance, cognition, and safety and independence in ADLs.  STR remains most appropriate discharge recommendation.   Recommendations for follow up therapy are one component of a multi-disciplinary discharge planning process, led by the attending physician.  Recommendations may be updated based on patient status, additional functional criteria and insurance authorization.    Follow Up Recommendations  Skilled nursing-short term rehab (<3 hours/day)    Assistance Recommended at Discharge Frequent or constant Supervision/Assistance  Patient can return home with the following  A lot of help  with walking and/or transfers;A lot of help with bathing/dressing/bathroom;Assistance with cooking/housework;Direct supervision/assist for medications management;Assist for transportation;Help with stairs or ramp for entrance   Equipment Recommendations  None recommended by OT    Recommendations for Other Services      Precautions / Restrictions Precautions Precautions: Fall Restrictions Weight Bearing Restrictions: No       Mobility Bed Mobility Overal bed mobility: Needs Assistance Bed Mobility: Supine to Sit     Supine to sit: Min assist;HOB elevated     General bed mobility comments: assist for trunk; increased effort and time for pt to perform as much as possible on own; heavy use of bed rails    Transfers Overall transfer level: Needs assistance Equipment used: Rolling walker (2 wheels) Transfers: Sit to/from Stand Sit to Stand: Mod assist           General transfer comment: x4 trials, requiring mod physical assist and verbal cues for technique and sequencing     Balance Overall balance assessment: Needs assistance Sitting-balance support: No upper extremity supported;Feet supported Sitting balance-Leahy Scale: Good Sitting balance - Comments: steady sitting reaching within BOS Postural control: Posterior lean Standing balance support: Single extremity supported Standing balance-Leahy Scale: Fair Standing balance comment: vc's for upright posture; steady standing with single UE support                           ADL either performed or assessed with clinical judgement   ADL Overall ADL's : Needs assistance/impaired     Grooming: Wash/dry face;Oral care Grooming Details (indicate cue type and reason): required min verbal cues for initiation and sequencing, no physical assist  provided                               General ADL Comments: Requires grossly mod-max assist for ADLs due to generalized weakness and impaired balance, as  well as cognitive deficits in sequencing, initiation, and problem solving    Extremity/Trunk Assessment Upper Extremity Assessment Upper Extremity Assessment: Generalized weakness   Lower Extremity Assessment Lower Extremity Assessment: Generalized weakness        Vision Patient Visual Report: No change from baseline     Perception     Praxis      Cognition Arousal/Alertness: Awake/alert Behavior During Therapy: WFL for tasks assessed/performed Overall Cognitive Status: History of cognitive impairments - at baseline Area of Impairment: Orientation;Problem solving;Memory;Following commands;Safety/judgement                 Orientation Level: Disoriented to;Time;Place (year: 1920, place: rehab)   Memory: Decreased recall of precautions Following Commands: Follows one step commands inconsistently Safety/Judgement: Decreased awareness of safety;Decreased awareness of deficits Awareness: Anticipatory Problem Solving: Slow processing;Decreased initiation;Difficulty sequencing;Requires verbal cues;Requires tactile cues General Comments: Oriented to person, general place, and general situation.  Unable to provide any part of date.          Exercises Other Exercises Other Exercises: Provided education re: fall and safety precautions, self-care, transfer technique.  Provided mod assist for sit to stand transfers, min verbal cues for seated grooming   Shoulder Instructions       General Comments small sore noted on R ear    Pertinent Vitals/ Pain       Pain Assessment: Faces Faces Pain Scale: Hurts a little bit Breathing: normal Negative Vocalization: none Facial Expression: sad, frightened, frown Body Language: relaxed Consolability: no need to console PAINAD Score: 1 Pain Location: clutching head after sit to stand practice, appeared to have pain in head but did not vocalize Pain Descriptors / Indicators: Grimacing Pain Intervention(s): Limited activity within  patient's tolerance;Monitored during session  Home Living                                          Prior Functioning/Environment              Frequency  Min 2X/week        Progress Toward Goals  OT Goals(current goals can now be found in the care plan section)  Progress towards OT goals: Progressing toward goals  Acute Rehab OT Goals Patient Stated Goal: pt did not state a goal OT Goal Formulation: With patient Time For Goal Achievement: 10/29/21 Potential to Achieve Goals: Good  Plan Discharge plan remains appropriate;Frequency remains appropriate    Co-evaluation                 AM-PAC OT "6 Clicks" Daily Activity     Outcome Measure   Help from another person eating meals?: A Lot Help from another person taking care of personal grooming?: A Little Help from another person toileting, which includes using toliet, bedpan, or urinal?: A Lot Help from another person bathing (including washing, rinsing, drying)?: A Lot Help from another person to put on and taking off regular upper body clothing?: A Lot Help from another person to put on and taking off regular lower body clothing?: A Lot 6 Click Score: 13    End of Session Equipment  Utilized During Treatment: Gait belt;Rolling walker (2 wheels)  OT Visit Diagnosis: Unsteadiness on feet (R26.81);Other abnormalities of gait and mobility (R26.89);Muscle weakness (generalized) (M62.81);Other symptoms and signs involving cognitive function   Activity Tolerance     Patient Left in bed;with call bell/phone within reach;with chair alarm set;with family/visitor present   Nurse Communication          Time: [id number] OT Time Calculation (min): 20 min  Charges: OT General Charges $OT Visit: 1 Visit OT Treatments $Self Care/Home Management : 8-22 mins  Jeneen Montgomery, OTR/L 10/19/21, 12:45 PM

## 2021-10-20 DIAGNOSIS — B370 Candidal stomatitis: Secondary | ICD-10-CM | POA: Diagnosis present

## 2021-10-20 LAB — CBC
HCT: 43.5 % (ref 36.0–46.0)
Hemoglobin: 14 g/dL (ref 12.0–15.0)
MCH: 26.9 pg (ref 26.0–34.0)
MCHC: 32.2 g/dL (ref 30.0–36.0)
MCV: 83.7 fL (ref 80.0–100.0)
Platelets: 299 10*3/uL (ref 150–400)
RBC: 5.2 MIL/uL — ABNORMAL HIGH (ref 3.87–5.11)
RDW: 14.5 % (ref 11.5–15.5)
WBC: 9.6 10*3/uL (ref 4.0–10.5)
nRBC: 0 % (ref 0.0–0.2)

## 2021-10-20 LAB — BASIC METABOLIC PANEL
Anion gap: 10 (ref 5–15)
BUN: 19 mg/dL (ref 8–23)
CO2: 28 mmol/L (ref 22–32)
Calcium: 8.6 mg/dL — ABNORMAL LOW (ref 8.9–10.3)
Chloride: 105 mmol/L (ref 98–111)
Creatinine, Ser: 1.33 mg/dL — ABNORMAL HIGH (ref 0.44–1.00)
GFR, Estimated: 38 mL/min — ABNORMAL LOW (ref >60)
Glucose, Bld: 112 mg/dL — ABNORMAL HIGH (ref 70–99)
Potassium: 3.8 mmol/L (ref 3.5–5.1)
Sodium: 143 mmol/L (ref 135–145)

## 2021-10-20 MED ORDER — SODIUM CHLORIDE 0.9 % IV BOLUS
500.0000 mL | Freq: Once | INTRAVENOUS | Status: AC
  Administered 2021-10-20: 16:00:00 500 mL via INTRAVENOUS

## 2021-10-20 NOTE — Plan of Care (Signed)
°  Problem: Education: Goal: Knowledge of General Education information will improve Description: Including pain rating scale, medication(s)/side effects and non-pharmacologic comfort measures Outcome: Progressing   Problem: Safety: Goal: Ability to remain free from injury will improve Outcome: Progressing   Problem: Urinary Elimination: Goal: Signs and symptoms of infection will decrease Outcome: Progressing

## 2021-10-20 NOTE — TOC Progression Note (Signed)
Transition of Care [facility]) - Progression Note    Patient Details  Name: Kerri [name] MRN: [id number] Date of Birth: April 06, 1931  [facility] Care [facility] CM/SW [place], RN Phone Number: 10/20/2021, 3:40 PM  Clinical Narrative:   [place] offered SNF bed, discussed with daughter.  Daughter [name] would like to discuss with her brother and will call TOC back.  TOC contact information given, TOC to follow to discharge.    Expected Discharge Plan: [place] Barriers to Discharge: Continued Medical Work up  Expected Discharge Plan and Services Expected Discharge Plan: [place]   Discharge Planning Services: CM Consult Post Acute Care Choice: [place] Living arrangements for the past 2 months: Single Family Home                 DME Arranged: N/A DME Agency: NA       HH Arranged: NA HH Agency: NA         Social Determinants of Health (SDOH) Interventions    Readmission Risk Interventions Readmission Risk Prevention Plan 05/01/2019  Medication Screening Complete  Transportation Screening Complete  Some recent data might be hidden

## 2021-10-20 NOTE — TOC Progression Note (Signed)
Transition of Care [facility]) - Progression Note    Patient Details  Name: Kerri [name] MRN: [id number] Date of Birth: 05-03-1931  Transition of Care [facility]) CM/SW Contact  Caryn Section, RN Phone Number: 10/20/2021, 3:39 PM  Clinical Narrative:   Delayed entry:  No bed offers on 10/19/2021, Gavin Pound from [facility] will call back.    Expected Discharge Plan: Skilled Nursing Facility Barriers to Discharge: Continued Medical Work up  Expected Discharge Plan and Services Expected Discharge Plan: Skilled Nursing Facility   Discharge Planning Services: CM Consult Post Acute Care Choice: Skilled Nursing Facility Living arrangements for the past 2 months: Single Family Home                 DME Arranged: N/A DME Agency: NA       HH Arranged: NA HH Agency: NA         Social Determinants of Health (SDOH) Interventions    Readmission Risk Interventions Readmission Risk Prevention Plan 05/01/2019  Medication Screening Complete  Transportation Screening Complete  Some recent data might be hidden

## 2021-10-20 NOTE — Progress Notes (Signed)
Physical Therapy Treatment Patient Details Name: Kerri [name] MRN: [id number] DOB: [birth year] Today's Date: 10/20/2021   History of Present Illness Pt is an 88 y.o. female presenting to ED 1/4 with generalized weakness.  Pt with unwitnessed fall at home Monday morning and has become more confused and weaker than normal.  Pt noted to be hypoxic in ED.  PMH includes CHF, complete heart block s/p pacemaker placement, NPH, dementia, htn, CKD.    PT Comments    Pt resting in bed upon PT arrival and agreeable to PT session; pt's son present during session.  Semi-supine to sitting edge of bed min assist; mod assist to stand from bed up to RW (pt noted to be incontinent of bowel so clean pad placed under pt prior to sitting back down and NT called to assist with pt care); mod assist to stand up to RW (NT present assisting pt with clean up); and then min to mod assist x1 (plus 2nd assist for safety) stand step turn bed to recliner with RW use (increased effort, time, and cueing required--including cueing not to sit early).  Then pt stood 2x's from recliner with mod assist and ambulated 6 feet with RW min to mod assist x1 with close chair follow (limited distance d/t pt fatigue and pt grabbing for furniture in room instead of holding onto walker).  Will continue to focus on strengthening and progressive functional mobility during hospitalization.   Recommendations for follow up therapy are one component of a multi-disciplinary discharge planning process, led by the attending physician.  Recommendations may be updated based on patient status, additional functional criteria and insurance authorization.  Follow Up Recommendations  Skilled nursing-short term rehab (<3 hours/day)     Assistance Recommended at Discharge Frequent or constant Supervision/Assistance  Patient can return home with the following Two people to help with walking and/or transfers;A lot of help with bathing/dressing/bathroom;Assistance  with cooking/housework;Direct supervision/assist for medications management;Assist for transportation;Direct supervision/assist for financial management;Help with stairs or ramp for entrance   Equipment Recommendations  Rolling walker (2 wheels);BSC/3in1;Wheelchair (measurements PT);Wheelchair cushion (measurements PT)    Recommendations for Other Services OT consult     Precautions / Restrictions Precautions Precautions: Fall Restrictions Weight Bearing Restrictions: No     Mobility  Bed Mobility Overal bed mobility: Needs Assistance Bed Mobility: Supine to Sit     Supine to sit: Min assist;HOB elevated     General bed mobility comments: assist to scoot L hip forward (to square up sitting edge of bed); heavy use of bed rails; increased time to perform    Transfers Overall transfer level: Needs assistance Equipment used: Rolling walker (2 wheels) Transfers: Sit to/from Stand;Bed to chair/wheelchair/BSC Sit to Stand: Mod assist     Step pivot transfers: Min assist;Mod assist;+2 safety/equipment (stand step turn bed to recliner with RW use)     General transfer comment: x2 trials standing from bed and x2 trials standing from recliner; vc's and tactile cues for UE placement; assist to initiate stand up to RW and to control descent sitting    Ambulation/Gait Ambulation/Gait assistance: Min assist;Mod assist Gait Distance (Feet): 6 Feet Assistive device: Rolling walker (2 wheels)   Gait velocity: decreased     General Gait Details: vc's to take bigger steps (decreased B LE step length/foot clearance); pt with increased difficulty advancing R LE and mostly sliding R foot forward on floor to advance; assist to steady   Stairs  Wheelchair Mobility    Modified Rankin (Stroke Patients Only)       Balance Overall balance assessment: Needs assistance Sitting-balance support: No upper extremity supported;Feet supported Sitting balance-Leahy Scale:  Good Sitting balance - Comments: steady sitting reaching within BOS Postural control: Posterior lean Standing balance support: Single extremity supported Standing balance-Leahy Scale: Fair Standing balance comment: vc's for upright posture; steady standing with single UE support                            Cognition Arousal/Alertness: Awake/alert Behavior During Therapy: Impulsive Overall Cognitive Status: History of cognitive impairments - at baseline Area of Impairment: Orientation;Problem solving;Memory;Following commands;Safety/judgement                     Memory: Decreased recall of precautions Following Commands: Follows one step commands inconsistently Safety/Judgement: Decreased awareness of safety;Decreased awareness of deficits Awareness: Anticipatory Problem Solving: Slow processing;Decreased initiation;Difficulty sequencing;Requires verbal cues;Requires tactile cues General Comments: Oriented to person, place, general situation, and month; pt reported it was 2021        Exercises      General Comments General comments (skin integrity, edema, etc.): small sore noted on R ear.  Nursing cleared pt for participation in physical therapy.  Pt agreeable to PT session.      Pertinent Vitals/Pain Pain Assessment: Faces Faces Pain Scale: Hurts little more Pain Location: generalized body pain Pain Descriptors / Indicators: Sore Pain Intervention(s): Limited activity within patient's tolerance;Monitored during session;Repositioned;Other (comment) (pt declining pain meds) Vitals (HR and O2 on room air) stable and WFL throughout treatment session.    Home Living                          Prior Function            PT Goals (current goals can now be found in the care plan section) Acute Rehab PT Goals Patient Stated Goal: to improve walking PT Goal Formulation: With patient Time For Goal Achievement: 10/29/21 Potential to Achieve Goals:  Good Progress towards PT goals: Progressing toward goals    Frequency    Min 2X/week      PT Plan Current plan remains appropriate    Co-evaluation              AM-PAC PT "6 Clicks" Mobility   Outcome Measure  Help needed turning from your back to your side while in a flat bed without using bedrails?: A Little Help needed moving from lying on your back to sitting on the side of a flat bed without using bedrails?: A Little Help needed moving to and from a bed to a chair (including a wheelchair)?: A Lot Help needed standing up from a chair using your arms (e.g., wheelchair or bedside chair)?: A Lot Help needed to walk in hospital room?: A Lot Help needed climbing 3-5 steps with a railing? : Total 6 Click Score: 13    End of Session Equipment Utilized During Treatment: Gait belt Activity Tolerance: Patient limited by fatigue Patient left: in chair;with call bell/phone within reach;with chair alarm set;with family/visitor present Nurse Communication: Mobility status;Precautions PT Visit Diagnosis: Muscle weakness (generalized) (M62.81);History of falling (Z91.81);Difficulty in walking, not elsewhere classified (R26.2)     Time: 4239-5320 PT Time Calculation (min) (ACUTE ONLY): 39 min  Charges:  $Gait Training: 8-22 mins $Therapeutic Activity: 23-37 mins  Hendricks LimesEmily Jiraiya Mcewan, PT 10/20/21, 12:36 PM

## 2021-10-20 NOTE — Progress Notes (Signed)
Patient ID: Kerri [name], female   DOB: [birth year], 88 y.o.   MRN: [id number] [facility] PROGRESS NOTE  Kerri [name] [id number] DOB: 1931/02/20 DOA: 10/15/2021 PCP: Donnamarie Rossetti, PA-C  HPI/Subjective: Patient asking if she can get out of the hospital.  Offers no physical complaints.  Able to straight leg raise.  Objective: Vitals:   10/20/21 0808 10/20/21 1210  BP: 125/87 128/69  Pulse: 60 60  Resp: 17 18  Temp: 97.8 F (36.6 C) 97.6 F (36.4 C)  SpO2: 93% 90%    Intake/Output Summary (Last 24 hours) at 10/20/2021 1517 Last data filed at 10/20/2021 1500 Gross per 24 hour  Intake 960 ml  Output 1400 ml  Net -440 ml   Filed Weights   10/17/21 0500 10/19/21 0500 10/20/21 0500  Weight: 77.9 kg 78.6 kg 77.7 kg    ROS: Review of Systems  Respiratory:  Negative for shortness of breath.   Cardiovascular:  Negative for chest pain.  Gastrointestinal:  Negative for abdominal pain.  Exam: Physical Exam HENT:     Head: Normocephalic.     Mouth/Throat:     Pharynx: No oropharyngeal exudate.  Eyes:     General: Lids are normal.     Conjunctiva/sclera: Conjunctivae normal.  Cardiovascular:     Rate and Rhythm: Normal rate and regular rhythm.     Heart sounds: Normal heart sounds, S1 normal and S2 normal.  Pulmonary:     Breath sounds: No decreased breath sounds, wheezing, rhonchi or rales.  Abdominal:     Palpations: Abdomen is soft.     Tenderness: There is no abdominal tenderness.  Musculoskeletal:     Right lower leg: No swelling.     Left lower leg: No swelling.  Skin:    General: Skin is warm.     Findings: No rash.  Neurological:     Mental Status: She is alert.     Comments: Answer some simple questions and follow some simple commands.      Scheduled Meds:  cephALEXin  500 mg Oral Q8H   donepezil  10 mg Oral QHS   enoxaparin (LOVENOX) injection  40 mg Subcutaneous Q24H   fesoterodine  4 mg Oral Daily   fluconazole  100 mg Oral  Daily   folic acid  1 mg Oral Daily   levothyroxine  50 mcg Oral QAC breakfast   losartan  12.5 mg Oral QHS   memantine  10 mg Oral QHS   metoprolol succinate  25 mg Oral Daily   polyethylene glycol  17 g Oral Daily   venlafaxine XR  75 mg Oral Q breakfast   Brief history: 88 year old female with past medical history of arthritis, dementia, depression, hypothyroidism presents to the hospital with generalized weakness and found to have acute cystitis with hematuria.  Physical therapy recommending rehab  Assessment/Plan:  Generalized weakness secondary to acute cystitis with hematuria.  Physical therapy still recommending rehab.  Currently just got 1 bed offer this afternoon.  Family to research to place and then decide tomorrow. Acute cystitis with hematuria and leukocytosis.  First urine culture showed multiple species.  Second culture is negative.  Rocephin switched over to Keflex and will complete therapy today. Acute kidney injury on chronic kidney disease stage IIIa.  Creatinine went from 1.62 down to 0.92.  Today's creatinine up at 1.33.  Hold losartan.  Give a fluid bolus and recheck creatinine tomorrow. Elevated BNP and echocardiogram showing an EF of 45 to 50%.  On  low-dose Toprol-XL.  Holding losartan with elevation in creatinine today.  No signs of heart failure. Constipation improved with MiraLAX. Dementia with normal pressure hydrocephalus Depression on Effexor Hypothyroidism unspecified on Synthroid Hyponatremia on presentation.  Last sodium normal range 143. Thrush improved with Diflucan        Code Status:     Code Status Orders  (From admission, onward)           Start     Ordered   10/15/21 1458  Full code  Continuous        10/15/21 1457           Code Status History     Date Active Date Inactive Code Status Order ID Comments User Context   01/07/2021 1220 01/08/2021 1601 Full Code [id number]  Isaias Cowman, MD Inpatient   05/01/2019 1332  05/03/2019 1822 DNR [id number]  Otila Back, MD Inpatient   05/01/2019 0230 05/01/2019 1332 Full Code [id number]  Mayer Camel, NP ED      Family Communication: Updated patient's daughter on the phone Disposition Plan: Status is: Inpatient.  Family to research the rehab facility that offered a bed.  [place]  [facility] [employer]

## 2021-10-20 NOTE — TOC Progression Note (Signed)
Transition of Care [facility]) - Progression Note    Patient Details  Name: Kerri [name] MRN: [id number] Date of Birth: Aug 17, 1931  Transition of Care [facility]) CM/SW Contact  Caryn Section, RN Phone Number: 10/20/2021, 3:58 PM  Clinical Narrative: Daughter Annabelle Harman called and asked to extend bed search to [facility]  Bed search extended.  TOC to follow.     Expected Discharge Plan: Skilled Nursing Facility Barriers to Discharge: Continued Medical Work up  Expected Discharge Plan and Services Expected Discharge Plan: Skilled Nursing Facility   Discharge Planning Services: CM Consult Post Acute Care Choice: Skilled Nursing Facility Living arrangements for the past 2 months: Single Family Home                 DME Arranged: N/A DME Agency: NA       HH Arranged: NA HH Agency: NA         Social Determinants of Health (SDOH) Interventions    Readmission Risk Interventions Readmission Risk Prevention Plan 05/01/2019  Medication Screening Complete  Transportation Screening Complete  Some recent data might be hidden

## 2021-10-21 LAB — BASIC METABOLIC PANEL
Anion gap: 7 (ref 5–15)
BUN: 19 mg/dL (ref 8–23)
CO2: 28 mmol/L (ref 22–32)
Calcium: 8.9 mg/dL (ref 8.9–10.3)
Chloride: 106 mmol/L (ref 98–111)
Creatinine, Ser: 1.2 mg/dL — ABNORMAL HIGH (ref 0.44–1.00)
GFR, Estimated: 44 mL/min — ABNORMAL LOW (ref >60)
Glucose, Bld: 119 mg/dL — ABNORMAL HIGH (ref 70–99)
Potassium: 4.3 mmol/L (ref 3.5–5.1)
Sodium: 141 mmol/L (ref 135–145)

## 2021-10-21 NOTE — Assessment & Plan Note (Signed)
Due to UTI.  WBC is normalized.

## 2021-10-21 NOTE — Assessment & Plan Note (Addendum)
Resolved.  Presented with mild hyponatremia with sodium 133, now normalized.

## 2021-10-21 NOTE — Assessment & Plan Note (Addendum)
Improved with MiraLAX.   Monitor and treat as needed.

## 2021-10-21 NOTE — Assessment & Plan Note (Addendum)
BP overall stable. --Continue Toprol -- Hold losartan on discharge to prevent hypotension (BP's controlled without it at this time; losartan was held due to AKI which has resolved)

## 2021-10-21 NOTE — Assessment & Plan Note (Signed)
Present on admission.  Treated with Rocephin and transitioned to Keflex based on urine culture.  Antibiotics completed.

## 2021-10-21 NOTE — Assessment & Plan Note (Addendum)
Improved. Diflucan given, completed 7 days.

## 2021-10-21 NOTE — Assessment & Plan Note (Addendum)
AKI present on admission with creatinine 1.62.  Improved with IV fluids.  Creatinine had improved to 0.92, mildly increased to 1.33>> 1.20>> 1.31 today. Given fluids on 1/10 and losartan held. Further IV hydration on 1/12 and Cr improved to 1.06.  AKI consistent with mild dehydration. --Hold losartan on discharge to prevent hypotension (BP's controlled without it at this time)

## 2021-10-21 NOTE — Progress Notes (Signed)
Occupational Therapy Treatment Patient Details Name: Kerri [name] MRN: [id number] DOB: May 05, 1931 Today's Date: 10/21/2021   History of present illness Pt is an 88 y.o. female presenting to ED 1/4 with generalized weakness.  Pt with unwitnessed fall at home Monday morning and has become more confused and weaker than normal.  Pt noted to be hypoxic in ED.  PMH includes CHF, complete heart block s/p pacemaker placement, NPH, dementia, htn, CKD.   OT comments  Upon entering the room, pt seated in recliner chair and requesting a bath when therapist entered the room. Pt with limited insight to deficits as she asks to get into the bottom of a tub to wash up. OT placing basin with warm water in front of her on table and pt needing cuing to sequence and initiate washing parts. Pt perseverating on washing under arms and needing redirection to wash each part. Pt standing with mod A and washes peri area with assist to wash buttocks. Max A to don mesh underwear and min A to thread UEs through hospital gown. Pt remained seated in recliner chair per her request with chair alarm activated for safety. All needs within reach. Pt continues to benefit from OT intervention.    Recommendations for follow up therapy are one component of a multi-disciplinary discharge planning process, led by the attending physician.  Recommendations may be updated based on patient status, additional functional criteria and insurance authorization.    Follow Up Recommendations  Skilled nursing-short term rehab (<3 hours/day)    Assistance Recommended at Discharge Frequent or constant Supervision/Assistance  Patient can return home with the following  A lot of help with walking and/or transfers;A lot of help with bathing/dressing/bathroom;Assistance with cooking/housework;Direct supervision/assist for medications management;Assist for transportation;Help with stairs or ramp for entrance   Equipment Recommendations  Other (comment)  (defer to next venue of care)       Precautions / Restrictions Precautions Precautions: Fall       Mobility Bed Mobility               General bed mobility comments: seated in recliener chair    Transfers Overall transfer level: Needs assistance Equipment used: 1 person hand held assist Transfers: Sit to/from Stand Sit to Stand: Mod assist                 Balance Overall balance assessment: Needs assistance Sitting-balance support: No upper extremity supported;Feet supported Sitting balance-Leahy Scale: Good     Standing balance support: During functional activity Standing balance-Leahy Scale: Poor                             ADL either performed or assessed with clinical judgement   ADL Overall ADL's : Needs assistance/impaired         Upper Body Bathing: Minimal assistance;Sitting Upper Body Bathing Details (indicate cue type and reason): perseverating on washing armpits and needing cuing for redirection Lower Body Bathing: Moderate assistance;Sit to/from stand   Upper Body Dressing : Minimal assistance;Sitting Upper Body Dressing Details (indicate cue type and reason): hospital gown                        Extremity/Trunk Assessment Upper Extremity Assessment Upper Extremity Assessment: Generalized weakness   Lower Extremity Assessment Lower Extremity Assessment: Generalized weakness        Vision Patient Visual Report: No change from baseline  Cognition Arousal/Alertness: Awake/alert Behavior During Therapy: Impulsive Overall Cognitive Status: History of cognitive impairments - at baseline                                 General Comments: Pt is oriented to self only. She is pleasant but very confused and showing limited insight to deficits.                     Pertinent Vitals/ Pain       Pain Assessment: No/denies pain         Frequency  Min 2X/week        Progress  Toward Goals  OT Goals(current goals can now be found in the care plan section)  Progress towards OT goals: Progressing toward goals  Acute Rehab OT Goals Patient Stated Goal: I want to take a bath OT Goal Formulation: With patient Time For Goal Achievement: 10/29/21 Potential to Achieve Goals: Good  Plan Discharge plan remains appropriate;Frequency remains appropriate       AM-PAC OT "6 Clicks" Daily Activity     Outcome Measure   Help from another person eating meals?: A Little Help from another person taking care of personal grooming?: A Little Help from another person toileting, which includes using toliet, bedpan, or urinal?: A Lot Help from another person bathing (including washing, rinsing, drying)?: A Lot Help from another person to put on and taking off regular upper body clothing?: A Little Help from another person to put on and taking off regular lower body clothing?: A Lot 6 Click Score: 15    End of Session    OT Visit Diagnosis: Unsteadiness on feet (R26.81);Other abnormalities of gait and mobility (R26.89);Muscle weakness (generalized) (M62.81);Other symptoms and signs involving cognitive function   Activity Tolerance Patient tolerated treatment well   Patient Left with call bell/phone within reach;in chair;with chair alarm set   Nurse Communication Mobility status        Time: [id number] OT Time Calculation (min): 23 min  Charges: OT General Charges $OT Visit: 1 Visit OT Treatments $Self Care/Home Management : 23-37 mins  Darleen Crocker, MS, OTR/L , CBIS ascom [phone]  10/21/21, 3:58 PM

## 2021-10-21 NOTE — Assessment & Plan Note (Addendum)
PT and OT recommending SNF for rehab at discharge.   Stable for d/c to rehab today.

## 2021-10-21 NOTE — Assessment & Plan Note (Signed)
Continue levothyroxine 

## 2021-10-21 NOTE — Assessment & Plan Note (Signed)
Continue Aricept and Namenda Delirium precautions 

## 2021-10-21 NOTE — Assessment & Plan Note (Signed)
Contributes to dementia likely.  No current acute issues.  Monitor.

## 2021-10-21 NOTE — Assessment & Plan Note (Signed)
Baseline CKD stage IIIa.

## 2021-10-21 NOTE — Assessment & Plan Note (Signed)
Continue Toviaz.

## 2021-10-21 NOTE — Assessment & Plan Note (Signed)
Status post pacemaker placement. ?

## 2021-10-21 NOTE — TOC Progression Note (Addendum)
Transition of Care [facility]) - Progression Note    Patient Details  Name: Kerri [name] MRN: [id number] Date of Birth: [birth year]  Transition [facility]) CM/SW [place], RN Phone Number: 10/21/2021, 1:07 PM  Clinical Narrative:   Patient's family [facility], [facility] Mack[place] aware TOC to follow Addendum:  auth in progress by facility   Expected Discharge Plan: [place] Barriers to Discharge: Continued Medical Work up  Expected Discharge Plan and Services Expected Discharge Plan: [place]   Discharge Planning Services: CM Consult Post Acute Care Choice: [place] Living arrangements for the past 2 months: Single Family Home                 DME Arranged: N/A DME Agency: NA       HH Arranged: NA HH Agency: NA         Social Determinants of Health (SDOH) Interventions    Readmission Risk Interventions Readmission Risk Prevention Plan 05/01/2019  Medication Screening Complete  Transportation Screening Complete  Some recent data might be hidden

## 2021-10-21 NOTE — Assessment & Plan Note (Addendum)
POA with K3.0.  Resolved with replacement.  Monitor and replace K as needed.

## 2021-10-21 NOTE — Progress Notes (Signed)
°  Progress Note   Patient: Kerri [name] [id number] DOB: 27-Sep-1931 DOA: 10/15/2021     6 DOS: the patient was seen and examined on 10/21/2021   Brief hospital course: 88 year old female with past medical history of arthritis, dementia, depression, hypothyroidism presents to the hospital with generalized weakness and found to have acute cystitis with hematuria.    Physical therapy recommending rehab.  TOC following for placement.    Assessment and Plan * UTI (urinary tract infection)- (present on admission) Present on admission.  Treated with Rocephin and transitioned to Keflex based on urine culture.  Antibiotics completed.  Generalized weakness PT and OT recommending SNF for rehab at discharge.  TOC following for placement.  Acute kidney injury superimposed on CKD ([place])- (present on admission) AKI present on admission with creatinine 1.62.  Improved with IV fluids.  Creatinine had improved to 0.92, mildly increased to 1.33>> 1.20 today.  Given fluids yesterday. -- Continue holding losartan -- Recheck BMP tomorrow  CKD (chronic kidney disease), stage III ([place])- (present on admission) Baseline CKD stage IIIa.  Thrush- (present on admission) Improved. Continue Diflucan  Elevated brain natriuretic peptide (BNP) level- (present on admission) Without overt signs of CHF decompensation.  Echo showed EF 45 to 50%. -- Continue Toprol-XL -- Losartan on hold due to CR increase -- Monitor volume status  Hyponatremia- (present on admission) Resolved.  Presented with mild hyponatremia sodium 133.  Hypokalemia- (present on admission) POA with K3.0.  Resolved with replacement.  Monitor and replace K as needed.  Leukocytosis- (present on admission) Due to UTI.  WBC is normalized.  Constipation- (present on admission) Improved with MiraLAX.  Monitor and treat as needed.  CHB (complete heart block) ([place])- (present on admission) Status post pacemaker placement  Dementia without  behavioral disturbance ([place])- (present on admission) Continue Aricept and Namenda. Delirium precautions  Normal pressure hydrocephalus ([facility])- (present on admission) Contributes to dementia likely.  No current acute issues.  Monitor.  Essential hypertension- (present on admission) BP overall stable. --Continue Toprol -- Losartan on hold resume when renal function improves  Overactive bladder- (present on admission) Continue Toviaz  Hypothyroidism- (present on admission) Continue levothyroxine     Subjective: Patient up in chair when seen this morning.  She reports being tired but offers no other acute complaints.  Hopes to get out of the hospital soon asks about going home.  Denies any mouth or throat pain, fevers chills, dysuria or abdominal pain nausea vomiting.  Objective Vitals reviewed and notable for mild bradycardia with HR in the upper 50s at times.  No fevers.  General exam: awake, alert, no acute distress Respiratory system: CTAB, no wheezes, rales or rhonchi, normal respiratory effort. Cardiovascular system: normal S1/S2, RRR, no pedal edema.   Gastrointestinal system: soft, NT, ND, no HSM felt, +bowel sounds. Central nervous system: Alert, no gross focal neurologic deficits, normal speech Extremities: moves all, no edema, normal tone Skin: dry, intact, normal temperature Psychiatry: normal mood, congruent affect   Data Reviewed:  Labs notable for glucose 119, creatinine 1.20 (down from 1.33),  Family Communication: None at bedside, will attempt to call  Disposition: Status is: Inpatient  Remains inpatient appropriate because: Creatinine remains elevated.  SNF placement pending, requires short-term rehab at discharge.         Time spent: 35 minutes  Author: Ezekiel Slocumb, DO 10/21/2021 3:31 PM  For on call review [email or web address].

## 2021-10-21 NOTE — Hospital Course (Addendum)
88 year old female with past medical history of arthritis, dementia, depression, hypothyroidism presents to the hospital with generalized weakness and found to have acute cystitis with hematuria.    Physical therapy recommending rehab at discharge.  10/23/21:  patient's renal function improved.  Doing well and medically stable for discharge to rehab today.

## 2021-10-21 NOTE — Assessment & Plan Note (Addendum)
Without overt signs of CHF decompensation.  Echo showed EF 45 to 50%. -- Continue Toprol-XL -- Losartan on hold as outlined  -- Monitor volume status closely with IVF running

## 2021-10-22 LAB — BASIC METABOLIC PANEL
Anion gap: 8 (ref 5–15)
BUN: 20 mg/dL (ref 8–23)
CO2: 29 mmol/L (ref 22–32)
Calcium: 8.8 mg/dL — ABNORMAL LOW (ref 8.9–10.3)
Chloride: 107 mmol/L (ref 98–111)
Creatinine, Ser: 1.32 mg/dL — ABNORMAL HIGH (ref 0.44–1.00)
GFR, Estimated: 39 mL/min — ABNORMAL LOW (ref >60)
Glucose, Bld: 110 mg/dL — ABNORMAL HIGH (ref 70–99)
Potassium: 4 mmol/L (ref 3.5–5.1)
Sodium: 144 mmol/L (ref 135–145)

## 2021-10-22 MED ORDER — SODIUM CHLORIDE 0.9 % IV SOLN: INTRAVENOUS | Status: DC

## 2021-10-22 NOTE — Progress Notes (Signed)
Physical Therapy Treatment Patient Details Name: Kerri [name] MRN: [id number] DOB: 28-Jun-1931 Today's Date: 10/22/2021   History of Present Illness Pt is an 88 y.o. female presenting to ED 1/4 with generalized weakness.  Pt with unwitnessed fall at home Monday morning and has become more confused and weaker than normal.  Pt noted to be hypoxic in ED.  PMH includes CHF, complete heart block s/p pacemaker placement, NPH, dementia, htn, CKD.    PT Comments    Pt resting in bed upon PT arrival; agreeable to PT session.  SBA semi-supine to sitting edge of bed (increased effort and time for pt to perform on own); min to mod assist with transfers using RW; and min to mod assist to ambulate 20 feet with RW (close chair follow required).  Limited distance ambulating d/t pt fatigue causing altered gait mechanics.  Pt incontinent of urine requiring clean up end of session (NT present assisting).  Will continue to focus on strengthening and progressive functional mobility during hospitalization.   Recommendations for follow up therapy are one component of a multi-disciplinary discharge planning process, led by the attending physician.  Recommendations may be updated based on patient status, additional functional criteria and insurance authorization.  Follow Up Recommendations  Skilled nursing-short term rehab (<3 hours/day)     Assistance Recommended at Discharge Frequent or constant Supervision/Assistance  Patient can return home with the following Two people to help with walking and/or transfers;A lot of help with bathing/dressing/bathroom;Assistance with cooking/housework;Direct supervision/assist for medications management;Assist for transportation;Direct supervision/assist for financial management;Help with stairs or ramp for entrance   Equipment Recommendations  Rolling walker (2 wheels);BSC/3in1;Wheelchair (measurements PT);Wheelchair cushion (measurements PT)    Recommendations for Other  Services OT consult     Precautions / Restrictions Precautions Precautions: Fall Restrictions Weight Bearing Restrictions: No     Mobility  Bed Mobility Overal bed mobility: Needs Assistance Bed Mobility: Supine to Sit     Supine to sit: Supervision;HOB elevated     General bed mobility comments: increased effort and time for pt to perform on own; use of bed rails    Transfers Overall transfer level: Needs assistance Equipment used: Rolling walker (2 wheels) Transfers: Sit to/from Stand Sit to Stand: Min assist;Mod assist     Step pivot transfers: Min assist;Mod assist (stand step turn bed to recliner with RW)     General transfer comment: x3 trials standing from recliner; vc's and tactile cues for UE placement; assist to initiate stand up to RW and to control descent sitting    Ambulation/Gait Ambulation/Gait assistance: Min assist;Mod assist Gait Distance (Feet): 20 Feet Assistive device: Rolling walker (2 wheels)   Gait velocity: decreased     General Gait Details: decreased stance time R LE; decreased R LE foot clearance and step length; pt with more flexed posture and had more difficulty following commands with fatigue limiting distance able to ambulate (chair follow required for safety)   Stairs             Wheelchair Mobility    Modified Rankin (Stroke Patients Only)       Balance Overall balance assessment: Needs assistance Sitting-balance support: No upper extremity supported;Feet supported Sitting balance-Leahy Scale: Good Sitting balance - Comments: steady sitting reaching within BOS   Standing balance support: Single extremity supported Standing balance-Leahy Scale: Poor Standing balance comment: pt requiring at least single UE support for static standing balance  Cognition Arousal/Alertness: Awake/alert Behavior During Therapy: Impulsive Overall Cognitive Status: History of cognitive  impairments - at baseline Area of Impairment: Orientation;Problem solving;Memory;Following commands;Safety/judgement                 Orientation Level: Disoriented to;Time (pt reports it is 2005)   Memory: Decreased recall of precautions Following Commands: Follows one step commands inconsistently Safety/Judgement: Decreased awareness of safety;Decreased awareness of deficits Awareness: Anticipatory Problem Solving: Slow processing;Decreased initiation;Difficulty sequencing;Requires verbal cues;Requires tactile cues          Exercises      General Comments General comments (skin integrity, edema, etc.): small sore noted on R ear.  Nursing cleared pt for participation in physical therapy.  Pt agreeable to PT session.      Pertinent Vitals/Pain  Vitals (HR and O2 on room air) stable and WFL throughout treatment session.    Home Living                          Prior Function            PT Goals (current goals can now be found in the care plan section) Acute Rehab PT Goals Patient Stated Goal: to improve walking PT Goal Formulation: With patient Time For Goal Achievement: 10/29/21 Potential to Achieve Goals: Good Progress towards PT goals: Progressing toward goals    Frequency    Min 2X/week      PT Plan Current plan remains appropriate    Co-evaluation              AM-PAC PT "6 Clicks" Mobility   Outcome Measure  Help needed turning from your back to your side while in a flat bed without using bedrails?: A Little Help needed moving from lying on your back to sitting on the side of a flat bed without using bedrails?: A Little Help needed moving to and from a bed to a chair (including a wheelchair)?: A Little Help needed standing up from a chair using your arms (e.g., wheelchair or bedside chair)?: A Lot Help needed to walk in hospital room?: A Lot Help needed climbing 3-5 steps with a railing? : Total 6 Click Score: 14    End of  Session Equipment Utilized During Treatment: Gait belt Activity Tolerance: Patient limited by fatigue Patient left: in chair;with call bell/phone within reach;with chair alarm set;with SCD's reapplied Nurse Communication: Mobility status;Precautions PT Visit Diagnosis: Muscle weakness (generalized) (M62.81);History of falling (Z91.81);Difficulty in walking, not elsewhere classified (R26.2)     Time: [id number] PT Time Calculation (min) (ACUTE ONLY): 38 min  Charges:  $Gait Training: 8-22 mins $Therapeutic Activity: 23-37 mins                     Leitha Bleak, PT 10/22/21, 5:36 PM

## 2021-10-22 NOTE — Care Management Important Message (Signed)
Important Message  Patient Details  Name: ANACAREN KOHAN MRN: [id number] Date of Birth: [birth year]   Medicare Important Message Given:  Yes     Olegario Messier A Yasir Kitner 10/22/2021, 2:20 PM

## 2021-10-22 NOTE — Progress Notes (Signed)
Progress Note   Patient: Kerri [name] [id number] DOB: [birth year] DOA: 10/15/2021     7 DOS: the patient was seen and examined on 10/22/2021   Brief hospital course: 88 year old female with past medical history of arthritis, dementia, depression, hypothyroidism presents to the hospital with generalized weakness and found to have acute cystitis with hematuria.    Physical therapy recommending rehab.  TOC following for placement.    Assessment and Plan * UTI (urinary tract infection)- (present on admission) Present on admission.  Treated with Rocephin and transitioned to Keflex based on urine culture.  Antibiotics completed.  Generalized weakness PT and OT recommending SNF for rehab at discharge.  TOC following for placement.  Acute kidney injury superimposed on CKD ([place])- (present on admission) AKI present on admission with creatinine 1.62.  Improved with IV fluids.  Creatinine had improved to 0.92, mildly increased to 1.33>> 1.20>> 1.31 today. Given fluids on 1/10 and losartan held. --IV hydration today: NS@100 /hr x 1 day -- Continue holding losartan -- Recheck BMP tomorrow  CKD (chronic kidney disease), stage III ([place])- (present on admission) Baseline CKD stage IIIa.  Thrush- (present on admission) Improved. Continue Diflucan, stop 1/13 to complete 7 days.  Elevated brain natriuretic peptide (BNP) level- (present on admission) Without overt signs of CHF decompensation.  Echo showed EF 45 to 50%. -- Continue Toprol-XL -- Losartan on hold due to CR increase -- Monitor volume status closely with IVF running  Hyponatremia- (present on admission) Resolved.  Presented with mild hyponatremia with sodium 133, now normalized.  Hypokalemia- (present on admission) POA with K3.0.  Resolved with replacement.  Monitor and replace K as needed.  Leukocytosis- (present on admission) Due to UTI.  WBC is normalized.  Constipation- (present on admission) Improved with MiraLAX.    Monitor and treat as needed.  CHB (complete heart block) ([place])- (present on admission) Status post pacemaker placement  Dementia without behavioral disturbance ([place])- (present on admission) Continue Aricept and Namenda. Delirium precautions  Normal pressure hydrocephalus ([facility])- (present on admission) Contributes to dementia likely.  No current acute issues.  Monitor.  Essential hypertension- (present on admission) BP overall stable. --Continue Toprol -- Losartan on hold resume when renal function improves  Overactive bladder- (present on admission) Continue Toviaz  Hypothyroidism- (present on admission) Continue levothyroxine     Subjective: Patient awake sitting up in bed with daughter at bedside when seen today.  She reports having some loose stool with stool softeners.  Has history of constipation at baseline.  Patient offers no other acute complaints at this time including fevers chills, headache nausea vomiting, chest pain or shortness of breath.  Objective Vitals reviewed and notable for BP mildly elevated at times with systolic in 0000000 but overall normal.  No fevers.  General exam: awake, alert, no acute distress HEENT: moist mucus membranes, hearing grossly normal  Respiratory system: CTAB, no wheezes, rales or rhonchi, normal respiratory effort. Cardiovascular system: normal S1/S2, RRR, no pedal edema.   Gastrointestinal system: soft, NT, ND. Central nervous system: no gross focal neurologic deficits, normal speech Extremities: moves all, no cyanosis, normal tone Skin: dry, intact, normal temperature Psychiatry: normal mood, congruent affect   Data Reviewed:  Labs reviewed and notable for creatinine up slightly from 1.20-1.32, glucose 110  Family Communication: Daughter at bedside on rounds today  Disposition: Status is: Inpatient  Remains inpatient appropriate because: Requires SNF placement for rehab at discharge, placement is pending.  Creatinine  elevated, on IV fluids.  Time spent: 35 minutes  Author: Ezekiel Slocumb, DO 10/22/2021 1:07 PM  For on call review [email or web address].

## 2021-10-23 LAB — BASIC METABOLIC PANEL
Anion gap: 9 (ref 5–15)
BUN: 14 mg/dL (ref 8–23)
CO2: 23 mmol/L (ref 22–32)
Calcium: 8.3 mg/dL — ABNORMAL LOW (ref 8.9–10.3)
Chloride: 110 mmol/L (ref 98–111)
Creatinine, Ser: 1.06 mg/dL — ABNORMAL HIGH (ref 0.44–1.00)
GFR, Estimated: 51 mL/min — ABNORMAL LOW (ref >60)
Glucose, Bld: 103 mg/dL — ABNORMAL HIGH (ref 70–99)
Potassium: 4 mmol/L (ref 3.5–5.1)
Sodium: 142 mmol/L (ref 135–145)

## 2021-10-23 LAB — RESP PANEL BY RT-PCR (FLU A&B, COVID) ARPGX2
Influenza A by PCR: NEGATIVE
Influenza B by PCR: NEGATIVE
SARS Coronavirus 2 by RT PCR: NEGATIVE

## 2021-10-23 MED ORDER — METOPROLOL SUCCINATE ER 25 MG PO TB24: 25.0000 mg | ORAL_TABLET | Freq: Every day | ORAL | Status: DC

## 2021-10-23 MED ORDER — POLYETHYLENE GLYCOL 3350 17 G PO PACK: 17.0000 g | PACK | Freq: Every day | ORAL | 0 refills | Status: DC | PRN

## 2021-10-23 MED ORDER — ACETAMINOPHEN 325 MG PO TABS: 650.0000 mg | ORAL_TABLET | Freq: Four times a day (QID) | ORAL | Status: AC | PRN

## 2021-10-23 NOTE — TOC Progression Note (Signed)
Transition of Care [facility]) - Progression Note    Patient Details  Name: CLARISSA LAIRD MRN: [id number] Date of Birth: 08-02-1931  [facility] CM/SW Contact  Caryn Section, RN Phone Number: 10/23/2021, 1:17 PM  Clinical Narrative:   Patient has SNF authorization as per [place] from [place], and can discharge to room 201 today.  EMS will transport patient.  TOC contacted son, who will contact daughter to advise her.      Expected Discharge Plan: Skilled Nursing Facility Barriers to Discharge: Continued Medical Work up  Expected Discharge Plan and Services Expected Discharge Plan: Skilled Nursing Facility   Discharge Planning Services: CM Consult Post Acute Care Choice: Skilled Nursing Facility Living arrangements for the past 2 months: Single Family Home Expected Discharge Date: 10/23/21               DME Arranged: N/A DME Agency: NA       HH Arranged: NA HH Agency: NA         Social Determinants of Health (SDOH) Interventions    Readmission Risk Interventions Readmission Risk Prevention Plan 05/01/2019  Medication Screening Complete  Transportation Screening Complete  Some recent data might be hidden

## 2021-10-23 NOTE — Discharge Summary (Signed)
Physician Discharge Summary   Patient: Kerri [name] MRN: [id number] DOB: 1931/04/10  Admit date:     10/15/2021  Discharge date: 10/23/21  Discharge Physician: Pennie Banter   PCP: Wilford Corner, PA-C   Recommendations at discharge:    Follow up with PCP in 1-2 weeks Repeat CBC/BMP in 1-2 weeks Follow up on blood pressure.  Losartan was stopped at time of discharge to prevent hypotension (it was held due to AKI and BP's were well-controlled)   Discharge Diagnoses Principal Problem:   UTI (urinary tract infection) Active Problems:   Generalized weakness   CKD (chronic kidney disease), stage III (HCC)   Acute kidney injury superimposed on CKD (HCC)   Leukocytosis   Hypokalemia   Hyponatremia   Elevated brain natriuretic peptide (BNP) level   Thrush   Constipation   CHB (complete heart block) (HCC)   Normal pressure hydrocephalus (HCC)   Dementia without behavioral disturbance (HCC)   Essential hypertension   Hypothyroidism   Overactive bladder  Resolved Problems:   * No resolved hospital problems. [facility] Course   88 year old female with past medical history of arthritis, dementia, depression, hypothyroidism presents to the hospital with generalized weakness and found to have acute cystitis with hematuria.    Physical therapy recommending rehab at discharge.  10/23/21:  patient's renal function improved.  Doing well and medically stable for discharge to rehab today.      * UTI (urinary tract infection)- (present on admission) Present on admission.  Treated with Rocephin and transitioned to Keflex based on urine culture.  Antibiotics completed.  Generalized weakness PT and OT recommending SNF for rehab at discharge.   Stable for d/c to rehab today.  Acute kidney injury superimposed on CKD (HCC)- (present on admission) AKI present on admission with creatinine 1.62.  Improved with IV fluids.  Creatinine had improved to 0.92, mildly increased to  1.33>> 1.20>> 1.31 today. Given fluids on 1/10 and losartan held. Further IV hydration on 1/12 and Cr improved to 1.06.  AKI consistent with mild dehydration. --Hold losartan on discharge to prevent hypotension (BP's controlled without it at this time)   CKD (chronic kidney disease), stage III (HCC)- (present on admission) Baseline CKD stage IIIa.  Thrush- (present on admission) Improved. Diflucan given, completed 7 days.  Elevated brain natriuretic peptide (BNP) level- (present on admission) Without overt signs of CHF decompensation.  Echo showed EF 45 to 50%. -- Continue Toprol-XL -- Losartan on hold as outlined  -- Monitor volume status closely with IVF running  Hyponatremia- (present on admission) Resolved.  Presented with mild hyponatremia with sodium 133, now normalized.  Hypokalemia- (present on admission) POA with K3.0.  Resolved with replacement.  Monitor and replace K as needed.  Leukocytosis- (present on admission) Due to UTI.  WBC is normalized.  Constipation- (present on admission) Improved with MiraLAX.   Monitor and treat as needed.  CHB (complete heart block) (HCC)- (present on admission) Status post pacemaker placement  Dementia without behavioral disturbance (HCC)- (present on admission) Continue Aricept and Namenda. Delirium precautions  Normal pressure hydrocephalus (HCC)- (present on admission) Contributes to dementia likely.  No current acute issues.  Monitor.  Essential hypertension- (present on admission) BP overall stable. --Continue Toprol -- Hold losartan on discharge to prevent hypotension (BP's controlled without it at this time; losartan was held due to AKI which has resolved)  Overactive bladder- (present on admission) Continue Toviaz  Hypothyroidism- (present on admission) Continue levothyroxine  Consultants: None Procedures performed: None  Disposition: Skilled nursing facility Diet recommendation: Cardiac  diet  DISCHARGE MEDICATION: Allergies as of 10/23/2021       Reactions   Bupropion Other (See Comments)   Tremors    Rofecoxib Diarrhea   Alendronate Rash        Medication List     TAKE these medications    acetaminophen 325 MG tablet Commonly known as: TYLENOL Take 2 tablets (650 mg total) by mouth every 6 (six) hours as needed for mild pain, moderate pain or fever.   CALCIUM-CARB 600 + D PO Take 1 tablet by mouth daily.   donepezil 10 MG tablet Commonly known as: ARICEPT Take 10 mg by mouth at bedtime.   fluticasone 50 MCG/ACT nasal spray Commonly known as: FLONASE Place 2 sprays into both nostrils daily.   folic acid 1 MG tablet Commonly known as: FOLVITE Take 1 mg by mouth daily.   levothyroxine 50 MCG tablet Commonly known as: SYNTHROID Take 50 mcg by mouth daily before breakfast.   memantine 10 MG tablet Commonly known as: NAMENDA Take 10 mg by mouth 2 (two) times daily.   metoprolol succinate 25 MG 24 hr tablet Commonly known as: TOPROL-XL Take 1 tablet (25 mg total) by mouth daily. Start taking on: October 24, 2021 What changed:  medication strength how much to take additional instructions   polyethylene glycol 17 g packet Commonly known as: MIRALAX / GLYCOLAX Take 17 g by mouth daily as needed.   tolterodine 4 MG 24 hr capsule Commonly known as: DETROL LA Take 4 mg by mouth daily.   venlafaxine XR 75 MG 24 hr capsule Commonly known as: EFFEXOR-XR Take 75 mg by mouth daily with breakfast.   Vitamin D (Ergocalciferol) 1.25 MG (50000 UNIT) Caps capsule Commonly known as: DRISDOL Take 50,000 Units by mouth every 7 (seven) days.        Contact information for follow-up providers     [place], [employer], PA-C In 1 week.   Specialty: Family Medicine Contact information: [place] [address] [phone]         [facility].   Specialty: Emergency Medicine Why:  If symptoms worsen Contact information: [place] [id number] [address] [place] [phone]             Contact information for after-discharge care     Destination     [facility] Preferred SNF .   Service: Skilled Nursing Contact information: [place] [phone]                     Discharge Exam: Filed Weights   10/20/21 0500 10/22/21 0500 10/23/21 0500  Weight: 77.7 kg 78.3 kg 77.9 kg   General exam: awake, alert, no acute distress HEENT: atraumatic, clear conjunctiva, anicteric sclera, moist mucus membranes, hearing grossly normal  Respiratory system: CTAB, no wheezes, rales or rhonchi, normal respiratory effort. Cardiovascular system: normal S1/S2,  RRR, no JVD, murmurs, rubs, gallops,  no pedal edema.   Gastrointestinal system: soft, NT, ND, no HSM felt, +bowel sounds. Central nervous system: no gross focal neurologic deficits, normal speech Extremities: moves all , no edema, normal tone Skin: dry, intact, normal temperature Psychiatry: normal mood, congruent affect   Condition at discharge: stable  The results of significant diagnostics from this hospitalization (including imaging, microbiology, ancillary and laboratory) are listed below for reference.   Imaging  Studies: DG Chest 1 View  Result Date: 10/12/2021 CLINICAL DATA:  Fall last night.  Altered mental status. EXAM: CHEST  1 VIEW COMPARISON:  08/15/2021. FINDINGS: Cardiac silhouette is top-normal in size. Stable pacer leads. No mediastinal or hilar masses. There are prominent markings most evident in the lung bases, stable. No evidence of pneumonia or pulmonary edema. No convincing pleural effusion and no pneumothorax. Skeletal structures are demineralized, but grossly intact. IMPRESSION: No acute cardiopulmonary disease. Electronically Signed   By: Lajean Manes M.D.   On: 10/12/2021 16:56   DG  Chest 2 View  Result Date: 10/15/2021 CLINICAL DATA:  Hypoxia EXAM: CHEST - 2 VIEW COMPARISON:  10/12/2021 FINDINGS: Lungs are clear. No pneumothorax or pleural effusion. Cardiac size within normal limits. Lead less pacemaker in place. Retained left subclavian pacemaker leads again noted within the right atrium and right ventricle. Pulmonary vascularity is normal. No acute bone abnormality. IMPRESSION: No active cardiopulmonary disease. Electronically Signed   By: Fidela Salisbury M.D.   On: 10/15/2021 03:51   DG Abdomen 1 View  Result Date: 10/15/2021 CLINICAL DATA:  88 year old female with constipation. EXAM: ABDOMEN - 1 VIEW COMPARISON:  CT Abdomen and Pelvis 01/12/2014. CTA chest 0705 hours today. FINDINGS: Portable AP supine view at 0712 hours. Excreted IV contrast in bilateral renal collecting systems. Non obstructed bowel gas pattern. Mild to moderate volume of retained stool in the colon, primarily the rectosigmoid. Degeneration in the lower lumbar spine. Chronic appearing pelvic pubic rami fractures appear stable since 2015. No acute osseous abnormality identified. IMPRESSION: 1. Non obstructed bowel gas pattern with mild to moderate volume of retained stool. 2. Excreted IV contrast in the renal collecting systems. Electronically Signed   By: Genevie Ann M.D.   On: 10/15/2021 07:43   CT Head Wo Contrast  Result Date: 10/15/2021 CLINICAL DATA:  Multiple falls, head injury, altered mental status EXAM: CT HEAD WITHOUT CONTRAST TECHNIQUE: Contiguous axial images were obtained from the base of the skull through the vertex without intravenous contrast. COMPARISON:  10/12/2021 FINDINGS: Brain: Normal anatomic configuration. Moderate parenchymal volume loss is commensurate with the patient's age. Moderate ventriculomegaly is again identified stable since prior examination. The degree of ventricular dilation appears asymmetric with the degree of parenchymal volume loss and while this may simply represent the  sequela of central atrophy, changes related to normal pressure hydrocephalus should be considered. Advanced subcortical and periventricular periventricular white matter changes are present, similar to prior examination, likely reflecting the sequela of small vessel ischemia. No abnormal intra or extra-axial mass lesion or fluid collection. No abnormal mass effect or midline shift. No evidence of acute intracranial hemorrhage or infarct. Advanced atrophic changes noted within the cerebellum. Vascular: No asymmetric hyperdense vasculature at the skull base. Skull: Intact Sinuses/Orbits: Paranasal sinuses are clear. Ocular lenses have been removed. Orbits are otherwise unremarkable. Other: Mastoid air cells and middle ear cavities are clear. IMPRESSION: No acute intracranial hemorrhage or infarct.  No calvarial fracture. Advanced parenchymal atrophy, similar to prior examination. Moderate ventriculomegaly, unchanged. While this may simply represent the sequela of central atrophy, correlation for signs and symptoms of normal pressure hydrocephalus may be helpful. Electronically Signed   By: Fidela Salisbury M.D.   On: 10/15/2021 04:07   CT HEAD WO CONTRAST (5MM)  Result Date: 10/12/2021 CLINICAL DATA:  88 year old female with history of neck pain. EXAM: CT HEAD WITHOUT CONTRAST CT CERVICAL SPINE WITHOUT CONTRAST TECHNIQUE: Multidetector CT imaging of the head and cervical spine was performed following the standard  protocol without intravenous contrast. Multiplanar CT image reconstructions of the cervical spine were also generated. COMPARISON:  Head and cervical spine CT 08/15/2021. FINDINGS: CT HEAD FINDINGS Brain: Severe cerebral and cerebellar atrophy with ex vacuo dilatation of the ventricular system. Patchy and confluent areas of decreased attenuation are noted throughout the deep and periventricular white matter of the cerebral hemispheres bilaterally, compatible with chronic microvascular ischemic disease.  Physiologic calcifications in the left basal ganglia. No evidence of acute infarction, hemorrhage, hydrocephalus, extra-axial collection or mass lesion/mass effect. Vascular: No hyperdense vessel or unexpected calcification. Skull: Normal. Negative for fracture or focal lesion. Sinuses/Orbits: No acute finding. Other: None. CT CERVICAL SPINE FINDINGS Alignment: Reversal of normal cervical lordosis, centered at the level of C4-C5, likely chronic. Skull base and vertebrae: No acute fracture. No primary bone lesion or focal pathologic process. Soft tissues and spinal canal: No prevertebral fluid or swelling. No visible canal hematoma. Disc levels: Multilevel degenerative disc disease, most pronounced at C3-C4, C4-C5, C5-C6 and C6-C7. Mild to moderate multilevel facet arthropathy. Upper chest: Unremarkable. Other: There are no aggressive appearing lytic or blastic lesions noted in the visualized portions of the skeleton. IMPRESSION: 1. No acute abnormality of the cervical spine to account for the patient's symptoms. 2. Severe multilevel degenerative disc disease and cervical spondylosis, similar to the prior study. 3. No acute intracranial abnormalities. 4. Severe cerebral and cerebellar atrophy with ex vacuo dilatation of the ventricular system and extensive chronic microvascular ischemic changes in the cerebral white matter, as above. Electronically Signed   By: Vinnie Langton M.D.   On: 10/12/2021 17:18   CT Angio Chest PE W and/or Wo Contrast  Result Date: 10/15/2021 CLINICAL DATA:  88 year old female with hypoxia and weakness. Recent falls. EXAM: CT ANGIOGRAPHY CHEST WITH CONTRAST TECHNIQUE: Multidetector CT imaging of the chest was performed using the standard protocol during bolus administration of intravenous contrast. Multiplanar CT image reconstructions and MIPs were obtained to evaluate the vascular anatomy. CONTRAST:  9mL OMNIPAQUE IOHEXOL 350 MG/ML SOLN COMPARISON:  Chest radiographs 0334 hours today.  CT chest 01/31/2012. FINDINGS: Cardiovascular: Excellent contrast bolus timing in the pulmonary arterial tree. Minor respiratory motion. No focal filling defect identified in the pulmonary arteries to suggest acute pulmonary embolism. No contrast in the aorta. Calcified aortic atherosclerosis. Calcified coronary artery atherosclerosis. There are external cardiac pacemaker leads. No cardiomegaly or pericardial effusion. Mediastinum/Nodes: Negative. No mediastinal mass or lymphadenopathy. Lungs/Pleura: Major airways are patent. Lung volumes are similar to 2013. In the right costophrenic angle there is confluent but enhancing opacity most suggestive of atelectasis. Mild dependent atelectasis in the left lower lobe. No pleural effusion or pulmonary inflammation identified. Upper Abdomen: A small hypodense area at the posterior right liver dome on series 4, image 81 is stable since 2013 and benign. Otherwise negative visible liver, spleen, right adrenal gland and stomach visible in the upper abdomen. Musculoskeletal: Dextroconvex thoracic scoliosis. No acute osseous abnormality identified. Review of the MIP images confirms the above findings. IMPRESSION: 1. Negative for acute pulmonary embolus. 2. Mild atelectasis.  No other acute pulmonary finding. 3. Calcified coronary artery and Aortic Atherosclerosis (ICD10-I70.0). Electronically Signed   By: Genevie Ann M.D.   On: 10/15/2021 07:30   CT Cervical Spine Wo Contrast  Result Date: 10/12/2021 CLINICAL DATA:  88 year old female with history of neck pain. EXAM: CT HEAD WITHOUT CONTRAST CT CERVICAL SPINE WITHOUT CONTRAST TECHNIQUE: Multidetector CT imaging of the head and cervical spine was performed following the standard protocol without intravenous contrast. Multiplanar CT  image reconstructions of the cervical spine were also generated. COMPARISON:  Head and cervical spine CT 08/15/2021. FINDINGS: CT HEAD FINDINGS Brain: Severe cerebral and cerebellar atrophy with ex vacuo  dilatation of the ventricular system. Patchy and confluent areas of decreased attenuation are noted throughout the deep and periventricular white matter of the cerebral hemispheres bilaterally, compatible with chronic microvascular ischemic disease. Physiologic calcifications in the left basal ganglia. No evidence of acute infarction, hemorrhage, hydrocephalus, extra-axial collection or mass lesion/mass effect. Vascular: No hyperdense vessel or unexpected calcification. Skull: Normal. Negative for fracture or focal lesion. Sinuses/Orbits: No acute finding. Other: None. CT CERVICAL SPINE FINDINGS Alignment: Reversal of normal cervical lordosis, centered at the level of C4-C5, likely chronic. Skull base and vertebrae: No acute fracture. No primary bone lesion or focal pathologic process. Soft tissues and spinal canal: No prevertebral fluid or swelling. No visible canal hematoma. Disc levels: Multilevel degenerative disc disease, most pronounced at C3-C4, C4-C5, C5-C6 and C6-C7. Mild to moderate multilevel facet arthropathy. Upper chest: Unremarkable. Other: There are no aggressive appearing lytic or blastic lesions noted in the visualized portions of the skeleton. IMPRESSION: 1. No acute abnormality of the cervical spine to account for the patient's symptoms. 2. Severe multilevel degenerative disc disease and cervical spondylosis, similar to the prior study. 3. No acute intracranial abnormalities. 4. Severe cerebral and cerebellar atrophy with ex vacuo dilatation of the ventricular system and extensive chronic microvascular ischemic changes in the cerebral white matter, as above. Electronically Signed   By: Trudie Reed M.D.   On: 10/12/2021 17:18   ECHOCARDIOGRAM COMPLETE  Result Date: 10/17/2021    ECHOCARDIOGRAM REPORT   Patient Name:   ROGENA GOLLEHON Date of Exam: 10/17/2021 Medical Rec #:  [id number]        Height:       68.0 in Accession #:    [id number]       Weight:       171.7 lb Date of Birth:   March 29, 1931        BSA:          1.916 m Patient Age:    88 years         BP:           125/67 mmHg Patient Gender: F                HR:           60 bpm. Exam Location:  [facility] Procedure: 2D Echo Indications:     CHF I50.21  History:         Patient has no prior history of Echocardiogram examinations.  Sonographer:     Overton Mam RDCS Referring Phys:  [id number] ADEFESO Diagnosing Phys: Julien Nordmann MD IMPRESSIONS  1. Left ventricular ejection fraction, by estimation, is 45 to 50%. The left ventricle has mildly decreased function. The left ventricle demonstrates regional wall motion abnormalities (septal wall motion abnormality consistent with conduction abnormality). There is moderate left ventricular hypertrophy. Left ventricular diastolic parameters are consistent with Grade I diastolic dysfunction (impaired relaxation).  2. Right ventricular systolic function is normal. The right ventricular size is normal.  3. The mitral valve is normal in structure. Mild mitral valve regurgitation. No evidence of mitral stenosis.  4. The aortic valve is normal in structure. Aortic valve regurgitation is not visualized. Aortic valve sclerosis is present, with no evidence of aortic valve stenosis.  5. The inferior vena cava is normal in size with greater than 50% respiratory  variability, suggesting right atrial pressure of 3 mmHg.  6. Rhythm appears to be NSR with 2nd degree AV block type I FINDINGS  Left Ventricle: Left ventricular ejection fraction, by estimation, is 45 to 50%. The left ventricle has mildly decreased function. The left ventricle demonstrates regional wall motion abnormalities. The left ventricular internal cavity size was normal in size. There is moderate left ventricular hypertrophy. Left ventricular diastolic parameters are consistent with Grade I diastolic dysfunction (impaired relaxation). Right Ventricle: The right ventricular size is normal. No increase in right ventricular wall thickness.  Right ventricular systolic function is normal. Left Atrium: Left atrial size was normal in size. Right Atrium: Right atrial size was normal in size. Pericardium: There is no evidence of pericardial effusion. Mitral Valve: The mitral valve is normal in structure. There is mild thickening of the mitral valve leaflet(s). Mild mitral valve regurgitation. No evidence of mitral valve stenosis. Tricuspid Valve: The tricuspid valve is normal in structure. Tricuspid valve regurgitation is mild . No evidence of tricuspid stenosis. Aortic Valve: The aortic valve is normal in structure. Aortic valve regurgitation is not visualized. Aortic valve sclerosis is present, with no evidence of aortic valve stenosis. Aortic valve peak gradient measures 4.9 mmHg. Pulmonic Valve: The pulmonic valve was normal in structure. Pulmonic valve regurgitation is not visualized. No evidence of pulmonic stenosis. Aorta: The aortic root is normal in size and structure. Venous: The inferior vena cava is normal in size with greater than 50% respiratory variability, suggesting right atrial pressure of 3 mmHg. IAS/Shunts: No atrial level shunt detected by color flow Doppler.  LEFT VENTRICLE PLAX 2D LVIDd:         4.80 cm     Diastology LVIDs:         3.30 cm     LV e' medial:    9.90 cm/s LV PW:         1.10 cm     LV E/e' medial:  5.7 LV IVS:        1.40 cm     LV e' lateral:   5.22 cm/s                            LV E/e' lateral: 10.7  LV Volumes (MOD) LV vol d, MOD A4C: 82.5 ml LV vol s, MOD A4C: 39.3 ml LV SV MOD A4C:     82.5 ml RIGHT VENTRICLE RV Basal diam:  2.60 cm LEFT ATRIUM           Index        RIGHT ATRIUM           Index LA diam:      2.60 cm 1.36 cm/m   RA Area:     11.50 cm LA Vol (A4C): 28.7 ml 14.98 ml/m  RA Volume:   21.10 ml  11.01 ml/m  AORTIC VALVE AV Vmax:      111.00 cm/s AV Peak Grad: 4.9 mmHg LVOT Vmax:    67.20 cm/s LVOT Vmean:   46.600 cm/s LVOT VTI:     0.112 m MITRAL VALVE               TRICUSPID VALVE MV Area (PHT):  2.24 cm    TV Peak grad:   18.9 mmHg MV Decel Time: 338 msec    TV Vmax:        2.17 m/s MV E velocity: 56.10 cm/s MV A velocity: 66.80 cm/s  SHUNTS  MV E/A ratio:  0.84        Systemic VTI: 0.11 m Ida Rogue MD Electronically signed by Ida Rogue MD Signature Date/Time: 10/17/2021/3:35:12 PM    Final     Microbiology: Results for orders placed or performed during the hospital encounter of 10/15/21  Resp Panel by RT-PCR (Flu A&B, Covid) Nasopharyngeal Swab     Status: None   Collection Time: 10/15/21  2:33 AM   Specimen: Nasopharyngeal Swab; Nasopharyngeal(NP) swabs in vial transport medium  Result Value Ref Range Status   SARS Coronavirus 2 by RT PCR NEGATIVE NEGATIVE Final    Comment: (NOTE) SARS-CoV-2 target nucleic acids are NOT DETECTED.  The SARS-CoV-2 RNA is generally detectable in upper respiratory specimens during the acute phase of infection. The lowest concentration of SARS-CoV-2 viral copies this assay can detect is 138 copies/mL. A negative result does not preclude SARS-Cov-2 infection and should not be used as the sole basis for treatment or other patient management decisions. A negative result may occur with  improper specimen collection/handling, submission of specimen other than nasopharyngeal swab, presence of viral mutation(s) within the areas targeted by this assay, and inadequate number of viral copies(<138 copies/mL). A negative result must be combined with clinical observations, patient history, and epidemiological information. The expected result is Negative.  Fact Sheet for Patients:  EntrepreneurPulse.com.au  Fact Sheet for Healthcare Providers:  IncredibleEmployment.be  This test is no t yet approved or cleared by the [place] FDA and  has been authorized for detection and/or diagnosis of SARS-CoV-2 by FDA under an Emergency Use Authorization (EUA). This EUA will remain  in effect (meaning this test can be  used) for the duration of the COVID-19 declaration under Section 564(b)(1) of the Act, 21 U.S.C.section 360bbb-3(b)(1), unless the authorization is terminated  or revoked sooner.       Influenza A by PCR NEGATIVE NEGATIVE Final   Influenza B by PCR NEGATIVE NEGATIVE Final    Comment: (NOTE) The Xpert Xpress SARS-CoV-2/FLU/RSV plus assay is intended as an aid in the diagnosis of influenza from Nasopharyngeal swab specimens and should not be used as a sole basis for treatment. Nasal washings and aspirates are unacceptable for Xpert Xpress SARS-CoV-2/FLU/RSV testing.  Fact Sheet for Patients: EntrepreneurPulse.com.au  Fact Sheet for Healthcare Providers: IncredibleEmployment.be  This test is not yet approved or cleared by the [place] FDA and has been authorized for detection and/or diagnosis of SARS-CoV-2 by FDA under an Emergency Use Authorization (EUA). This EUA will remain in effect (meaning this test can be used) for the duration of the COVID-19 declaration under Section 564(b)(1) of the Act, 21 U.S.C. section 360bbb-3(b)(1), unless the authorization is terminated or revoked.  Performed at [facility], [address], [place], [place] [address]   Urine Culture     Status: Abnormal   Collection Time: 10/15/21  6:32 AM   Specimen: Urine, Clean Catch  Result Value Ref Range Status   Specimen Description   Final    URINE, CLEAN CATCH Performed at [facility], [address], [place], [place] [address]    Special Requests   Final    NONE Performed at [facility], [address] [place], [place] [address]    Culture MULTIPLE SPECIES PRESENT, SUGGEST RECOLLECTION (A)  Final   Report Status 10/16/2021 FINAL  Final  Urine Culture     Status: None   Collection Time: 10/16/21  5:30 PM   Specimen: Urine, Random  Result Value Ref Range  Status   Specimen Description   Final    URINE, RANDOM Performed at  [facility], [address], [place], [place] [address]    Special Requests   Final    Normal Performed at [facility], [address] [place], [place] [address]    Culture   Final    NO GROWTH Performed at [facility], [address], [place], [place] [address]    Report Status 10/18/2021 FINAL  Final    Labs: CBC: Recent Labs  Lab 10/20/21 0801  WBC 9.6  HGB 14.0  HCT 43.5  MCV 83.7  PLT 123XX123   Basic Metabolic Panel: Recent Labs  Lab 10/17/21 0457 10/20/21 0801 10/21/21 0451 10/22/21 0555 10/23/21 0734  NA 139 143 141 144 142  K 3.5 3.8 4.3 4.0 4.0  CL 107 105 106 107 110  CO2 25 28 28 29 23   GLUCOSE 124* 112* 119* 110* 103*  BUN 19 19 19 20 14   CREATININE 0.92 1.33* 1.20* 1.32* 1.06*  CALCIUM 8.3* 8.6* 8.9 8.8* 8.3*   Liver Function Tests: No results for input(s): AST, ALT, ALKPHOS, BILITOT, PROT, ALBUMIN in the last 168 hours. CBG: No results for input(s): GLUCAP in the last 168 hours.  Discharge time spent: greater than 30 minutes.  Signed: Ezekiel Slocumb, DO [facility] 10/23/2021

## 2021-10-24 LAB — URINALYSIS, ROUTINE W REFLEX MICROSCOPIC
Bilirubin Urine: NEGATIVE
Glucose, UA: NEGATIVE mg/dL
Ketones, ur: NEGATIVE mg/dL
Nitrite: POSITIVE — AB
Protein, ur: 30 mg/dL — AB
Specific Gravity, Urine: 1.02 (ref 1.005–1.030)
pH: 5.5 (ref 5.0–8.0)

## 2021-10-27 ENCOUNTER — Other Ambulatory Visit: Payer: Self-pay | Admitting: Family Medicine

## 2021-10-27 DIAGNOSIS — R1310 Dysphagia, unspecified: Secondary | ICD-10-CM

## 2021-10-27 DIAGNOSIS — F0280 Dementia in other diseases classified elsewhere without behavioral disturbance: Secondary | ICD-10-CM

## 2022-01-29 ENCOUNTER — Emergency Department: Payer: Medicare Other

## 2022-01-29 ENCOUNTER — Encounter: Payer: Self-pay | Admitting: Family Medicine

## 2022-01-29 ENCOUNTER — Inpatient Hospital Stay
Admission: EM | Admit: 2022-01-29 | Discharge: 2022-01-31 | DRG: 689 | Disposition: A | Discharge status: Home Health | Payer: Medicare Other | Attending: Internal Medicine | Admitting: Internal Medicine

## 2022-01-29 ENCOUNTER — Other Ambulatory Visit: Payer: Self-pay

## 2022-01-29 DIAGNOSIS — F0390 Unspecified dementia without behavioral disturbance: Secondary | ICD-10-CM | POA: Diagnosis not present

## 2022-01-29 DIAGNOSIS — N1832 Chronic kidney disease, stage 3b: Secondary | ICD-10-CM | POA: Diagnosis not present

## 2022-01-29 DIAGNOSIS — I130 Hypertensive heart and chronic kidney disease with heart failure and stage 1 through stage 4 chronic kidney disease, or unspecified chronic kidney disease: Secondary | ICD-10-CM | POA: Diagnosis not present

## 2022-01-29 DIAGNOSIS — N390 Urinary tract infection, site not specified: Secondary | ICD-10-CM | POA: Diagnosis not present

## 2022-01-29 DIAGNOSIS — G9341 Metabolic encephalopathy: Secondary | ICD-10-CM | POA: Diagnosis present

## 2022-01-29 DIAGNOSIS — Z9181 History of falling: Secondary | ICD-10-CM | POA: Diagnosis not present

## 2022-01-29 DIAGNOSIS — R531 Weakness: Secondary | ICD-10-CM | POA: Diagnosis present

## 2022-01-29 DIAGNOSIS — Z79899 Other long term (current) drug therapy: Secondary | ICD-10-CM | POA: Diagnosis not present

## 2022-01-29 DIAGNOSIS — N179 Acute kidney failure, unspecified: Secondary | ICD-10-CM

## 2022-01-29 DIAGNOSIS — N189 Chronic kidney disease, unspecified: Secondary | ICD-10-CM | POA: Diagnosis not present

## 2022-01-29 DIAGNOSIS — E039 Hypothyroidism, unspecified: Secondary | ICD-10-CM | POA: Diagnosis not present

## 2022-01-29 DIAGNOSIS — Z888 Allergy status to other drugs, medicaments and biological substances status: Secondary | ICD-10-CM

## 2022-01-29 DIAGNOSIS — Z950 Presence of cardiac pacemaker: Secondary | ICD-10-CM | POA: Diagnosis not present

## 2022-01-29 DIAGNOSIS — Z20822 Contact with and (suspected) exposure to covid-19: Secondary | ICD-10-CM | POA: Diagnosis present

## 2022-01-29 DIAGNOSIS — G309 Alzheimer's disease, unspecified: Secondary | ICD-10-CM | POA: Diagnosis not present

## 2022-01-29 DIAGNOSIS — I5022 Chronic systolic (congestive) heart failure: Secondary | ICD-10-CM | POA: Diagnosis not present

## 2022-01-29 DIAGNOSIS — F0280 Dementia in other diseases classified elsewhere without behavioral disturbance: Secondary | ICD-10-CM | POA: Diagnosis not present

## 2022-01-29 DIAGNOSIS — M1990 Unspecified osteoarthritis, unspecified site: Secondary | ICD-10-CM | POA: Diagnosis not present

## 2022-01-29 DIAGNOSIS — Z79890 Hormone replacement therapy (postmenopausal): Secondary | ICD-10-CM | POA: Diagnosis not present

## 2022-01-29 DIAGNOSIS — Z87440 Personal history of urinary (tract) infections: Secondary | ICD-10-CM

## 2022-01-29 DIAGNOSIS — I10 Essential (primary) hypertension: Secondary | ICD-10-CM | POA: Diagnosis not present

## 2022-01-29 DIAGNOSIS — I442 Atrioventricular block, complete: Secondary | ICD-10-CM | POA: Diagnosis not present

## 2022-01-29 DIAGNOSIS — I340 Nonrheumatic mitral (valve) insufficiency: Secondary | ICD-10-CM | POA: Diagnosis not present

## 2022-01-29 DIAGNOSIS — N3090 Cystitis, unspecified without hematuria: Principal | ICD-10-CM

## 2022-01-29 LAB — URINALYSIS, ROUTINE W REFLEX MICROSCOPIC
Bilirubin Urine: NEGATIVE
Glucose, UA: NEGATIVE mg/dL
Ketones, ur: NEGATIVE mg/dL
Nitrite: POSITIVE — AB
Protein, ur: NEGATIVE mg/dL
Specific Gravity, Urine: 1.01 (ref 1.005–1.030)
pH: 5 (ref 5.0–8.0)

## 2022-01-29 LAB — TROPONIN I (HIGH SENSITIVITY)
Troponin I (High Sensitivity): 12 ng/L (ref <18)
Troponin I (High Sensitivity): 13 ng/L (ref <18)

## 2022-01-29 LAB — COMPREHENSIVE METABOLIC PANEL
ALT: 20 U/L (ref 0–44)
AST: 27 U/L (ref 15–41)
Albumin: 3.6 g/dL (ref 3.5–5.0)
Alkaline Phosphatase: 74 U/L (ref 38–126)
Anion gap: 10 (ref 5–15)
BUN: 18 mg/dL (ref 8–23)
CO2: 21 mmol/L — ABNORMAL LOW (ref 22–32)
Calcium: 8.9 mg/dL (ref 8.9–10.3)
Chloride: 107 mmol/L (ref 98–111)
Creatinine, Ser: 1.41 mg/dL — ABNORMAL HIGH (ref 0.44–1.00)
GFR, Estimated: 36 mL/min — ABNORMAL LOW (ref >60)
Glucose, Bld: 170 mg/dL — ABNORMAL HIGH (ref 70–99)
Potassium: 3.9 mmol/L (ref 3.5–5.1)
Sodium: 138 mmol/L (ref 135–145)
Total Bilirubin: 0.6 mg/dL (ref 0.3–1.2)
Total Protein: 7.3 g/dL (ref 6.5–8.1)

## 2022-01-29 LAB — CBC WITH DIFFERENTIAL/PLATELET
Abs Immature Granulocytes: 0.04 10*3/uL (ref 0.00–0.07)
Basophils Absolute: 0.1 10*3/uL (ref 0.0–0.1)
Basophils Relative: 1 %
Eosinophils Absolute: 0.2 10*3/uL (ref 0.0–0.5)
Eosinophils Relative: 1 %
HCT: 45.7 % (ref 36.0–46.0)
Hemoglobin: 14.7 g/dL (ref 12.0–15.0)
Immature Granulocytes: 0 %
Lymphocytes Relative: 26 %
Lymphs Abs: 2.8 10*3/uL (ref 0.7–4.0)
MCH: 27.8 pg (ref 26.0–34.0)
MCHC: 32.2 g/dL (ref 30.0–36.0)
MCV: 86.4 fL (ref 80.0–100.0)
Monocytes Absolute: 0.8 10*3/uL (ref 0.1–1.0)
Monocytes Relative: 8 %
Neutro Abs: 7 10*3/uL (ref 1.7–7.7)
Neutrophils Relative %: 64 %
Platelets: 241 10*3/uL (ref 150–400)
RBC: 5.29 MIL/uL — ABNORMAL HIGH (ref 3.87–5.11)
RDW: 14.6 % (ref 11.5–15.5)
WBC: 10.8 10*3/uL — ABNORMAL HIGH (ref 4.0–10.5)
nRBC: 0 % (ref 0.0–0.2)

## 2022-01-29 LAB — LACTIC ACID, PLASMA
Lactic Acid, Venous: 2.2 mmol/L (ref 0.5–1.9)
Lactic Acid, Venous: 0.9 mmol/L (ref 0.5–1.9)

## 2022-01-29 MED ORDER — ACETAMINOPHEN 325 MG PO TABS: 650.0000 mg | ORAL_TABLET | Freq: Four times a day (QID) | ORAL | Status: DC | PRN

## 2022-01-29 MED ORDER — DONEPEZIL HCL 5 MG PO TABS
10.0000 mg | ORAL_TABLET | Freq: Every day | ORAL | Status: DC
  Administered 2022-01-30 (×2): 10 mg via ORAL
  Filled 2022-01-29 (×2): qty 2

## 2022-01-29 MED ORDER — SODIUM CHLORIDE 0.9 % IV SOLN
1.0000 g | Freq: Once | INTRAVENOUS | Status: AC
  Administered 2022-01-29: 1 g via INTRAVENOUS
  Filled 2022-01-29: qty 10

## 2022-01-29 MED ORDER — VENLAFAXINE HCL ER 75 MG PO CP24
75.0000 mg | ORAL_CAPSULE | Freq: Every day | ORAL | Status: DC
  Administered 2022-01-30 – 2022-01-31 (×2): 75 mg via ORAL
  Filled 2022-01-29 (×2): qty 1

## 2022-01-29 MED ORDER — LEVOTHYROXINE SODIUM 50 MCG PO TABS
50.0000 ug | ORAL_TABLET | Freq: Every day | ORAL | Status: DC
  Administered 2022-01-30 – 2022-01-31 (×2): 50 ug via ORAL
  Filled 2022-01-29 (×2): qty 1

## 2022-01-29 MED ORDER — MEMANTINE HCL 5 MG PO TABS
10.0000 mg | ORAL_TABLET | Freq: Two times a day (BID) | ORAL | Status: DC
  Administered 2022-01-30 – 2022-01-31 (×4): 10 mg via ORAL
  Filled 2022-01-29 (×4): qty 2

## 2022-01-29 MED ORDER — MAGNESIUM HYDROXIDE 400 MG/5ML PO SUSP: 30.0000 mL | Freq: Every day | ORAL | Status: DC | PRN

## 2022-01-29 MED ORDER — POLYETHYLENE GLYCOL 3350 17 G PO PACK: 17.0000 g | PACK | Freq: Every day | ORAL | Status: DC | PRN

## 2022-01-29 MED ORDER — METOPROLOL SUCCINATE ER 25 MG PO TB24
25.0000 mg | ORAL_TABLET | Freq: Every day | ORAL | Status: DC
  Administered 2022-01-30 – 2022-01-31 (×2): 25 mg via ORAL
  Filled 2022-01-29 (×2): qty 1

## 2022-01-29 MED ORDER — SODIUM CHLORIDE 0.9 % IV SOLN: INTRAVENOUS | Status: DC

## 2022-01-29 MED ORDER — ACETAMINOPHEN 650 MG RE SUPP: 650.0000 mg | Freq: Four times a day (QID) | RECTAL | Status: DC | PRN

## 2022-01-29 MED ORDER — ENOXAPARIN SODIUM 30 MG/0.3ML IJ SOSY
30.0000 mg | PREFILLED_SYRINGE | INTRAMUSCULAR | Status: DC
  Administered 2022-01-30 – 2022-01-31 (×2): 30 mg via SUBCUTANEOUS
  Filled 2022-01-29 (×2): qty 0.3

## 2022-01-29 MED ORDER — TRAZODONE HCL 50 MG PO TABS: 25.0000 mg | ORAL_TABLET | Freq: Every evening | ORAL | Status: DC | PRN

## 2022-01-29 MED ORDER — FESOTERODINE FUMARATE ER 4 MG PO TB24
4.0000 mg | ORAL_TABLET | Freq: Every day | ORAL | Status: DC
  Administered 2022-01-30 – 2022-01-31 (×2): 4 mg via ORAL
  Filled 2022-01-29 (×2): qty 1

## 2022-01-29 MED ORDER — FOLIC ACID 1 MG PO TABS
1.0000 mg | ORAL_TABLET | Freq: Every day | ORAL | Status: DC
  Administered 2022-01-30 – 2022-01-31 (×2): 1 mg via ORAL
  Filled 2022-01-29 (×2): qty 1

## 2022-01-29 MED ORDER — VITAMIN D (ERGOCALCIFEROL) 1.25 MG (50000 UNIT) PO CAPS: 50000.0000 [IU] | ORAL_CAPSULE | ORAL | Status: DC

## 2022-01-29 MED ORDER — SODIUM CHLORIDE 0.9 % IV BOLUS
1000.0000 mL | Freq: Once | INTRAVENOUS | Status: AC
  Administered 2022-01-29: 1000 mL via INTRAVENOUS

## 2022-01-29 MED ORDER — CYANOCOBALAMIN 1000 MCG/ML IJ SOLN
1000.0000 ug | INTRAMUSCULAR | Status: DC
  Administered 2022-01-30: 1000 ug via INTRAMUSCULAR
  Filled 2022-01-29: qty 1

## 2022-01-29 MED ORDER — FLUTICASONE PROPIONATE 50 MCG/ACT NA SUSP: 2.0000 | Freq: Every day | NASAL | Status: DC

## 2022-01-29 NOTE — H&P (Signed)
?  ?  ?[place] ? ? ?PATIENT NAME: Kerri [name]   ? ?MR#:  [id number] ? ?DATE OF BIRTH:  [birth year] ? ?DATE OF ADMISSION:  01/29/2022 ? ?PRIMARY CARE PHYSICIAN: Wilford Corner, PA-C  ? ?Patient is coming from: Home ? ?REQUESTING/REFERRING PHYSICIAN: Alfonse Flavors, MD ? ?CHIEF COMPLAINT:  ? ?Chief Complaint  ?Patient presents with  ?? Altered Mental Status  ? ? ?HISTORY OF PRESENT ILLNESS:  ?Kerri [name] is a 88 y.o. Caucasian female with medical history significant for essential hypertension, mixed Alzheimer's and vascular dementia, chronic systolic CHF, moderate mitral regurgitation and complete AV block s/p pacemaker placement, who presented to the emergency room with acute onset of altered mental status with confusion, generalized weakness and diminished p.o. intake.  She noted urinary frequency, urgency and admitted to dysuria without hematuria or flank pain.  She has also noticed foul-smelling urine.  No fever or chills.  No nausea or vomiting or abdominal pain.  No cough or wheezing.  No dyspnea or chest pain or palpitations. ? ?ED Course: When the patient presented to the ER BP was 134/105 with otherwise normal vital signs.  Labs revealed a blood glucose of 170 with a CO2 of 21 with a creatinine of 1.41.  Lactic acid was 2.2 and later 0.9.  CBC showed WBC of 10.8.  UA was positive for UTI. ?EKG as reviewed by me : Showed paced rhythm with a rate of 63 ?Imaging: Noncontrast head CT scan revealed stable moderate ventriculomegaly and chronic ischemic vessel changes with no acute intracranial abnormality. ? ?The patient was given 1 g of IV Rocephin and 1 L bolus of IV normal saline.  She will be admitted to a medical telemetry bed for further evaluation and management. ?PAST MEDICAL HISTORY:  ? ?Past Medical History:  ?Diagnosis Date  ?? Arthritis   ?Essential hypertension, mixed Alzheimer's and vascular dementia, chronic systolic CHF, moderate mitral regurgitation and complete AV block s/p  pacemaker placement. ?PAST SURGICAL HISTORY:  ? ?Past Surgical History:  ?Procedure Laterality Date  ?? PACEMAKER LEADLESS INSERTION N/A 01/07/2021  ? Procedure: PACEMAKER LEADLESS INSERTION;  Surgeon: Marcina Millard, MD;  Location: [facility] INVASIVE CV LAB;  Service: Cardiovascular;  Laterality: N/A;  ?? PPM GENERATOR REMOVAL N/A 01/07/2021  ? Procedure: PPM GENERATOR REMOVAL;  Surgeon: Marcina Millard, MD;  Location: [facility] INVASIVE CV LAB;  Service: Cardiovascular;  Laterality: N/A;  ? ? ?SOCIAL HISTORY:  ? ?Social History  ? ?Tobacco Use  ?? Smoking status: Never  ?? Smokeless tobacco: Never  ?Substance Use Topics  ?? Alcohol use: Never  ? ? ?FAMILY HISTORY:  ?History reviewed. No pertinent family history.  She denies any familial diseases. ? ?DRUG ALLERGIES:  ? ?Allergies  ?Allergen Reactions  ?? Alendronate Sodium Other (See Comments)  ?? Bupropion Other (See Comments)  ?  Tremors  ? ?  ?? Rofecoxib Diarrhea  ?? Alendronate Rash  ? ? ?REVIEW OF SYSTEMS:  ? ?ROS ?As per history of present illness. All pertinent systems were reviewed above. Constitutional, HEENT, cardiovascular, respiratory, GI, GU, musculoskeletal, neuro, psychiatric, endocrine, integumentary and hematologic systems were reviewed and are otherwise negative/unremarkable except for positive findings mentioned above in the HPI. ? ? ?MEDICATIONS AT HOME:  ? ?Prior to Admission medications   ?Medication Sig Start Date End Date Taking? Authorizing Provider  ?Calcium Carbonate-Vitamin D (CALCIUM-CARB 600 + D PO) Take 1 tablet by mouth daily.   Yes [provider]  ?cyanocobalamin (,VITAMIN B-12,) 1000 MCG/ML injection Inject 1,000 mcg into  the muscle every 30 (thirty) days. 11/11/21  Yes [provider]  ?donepezil (ARICEPT) 10 MG tablet Take 10 mg by mouth at bedtime.   Yes [provider]  ?folic acid (FOLVITE) 1 MG tablet Take 1 mg by mouth daily.   Yes [provider]  ?levothyroxine (SYNTHROID) 50 MCG tablet  Take 50 mcg by mouth daily before breakfast. 11/17/18  Yes [provider]  ?memantine (NAMENDA) 10 MG tablet Take 10 mg by mouth 2 (two) times daily. 02/19/19  Yes [provider]  ?metoprolol succinate (TOPROL-XL) 25 MG 24 hr tablet Take 1 tablet (25 mg total) by mouth daily. 10/24/21  Yes Esaw GrandchildGriffith, Kelly A, DO  ?tolterodine (DETROL LA) 4 MG 24 hr capsule Take 4 mg by mouth daily.   Yes [provider]  ?trimethoprim (TRIMPEX) 100 MG tablet Take 100 mg by mouth daily. 01/11/22  Yes [provider]  ?venlafaxine XR (EFFEXOR-XR) 75 MG 24 hr capsule Take 75 mg by mouth daily with breakfast.  03/09/19  Yes [provider]  ?Vitamin D, Ergocalciferol, (DRISDOL) 1.25 MG (50000 UNIT) CAPS capsule Take 50,000 Units by mouth every 7 (seven) days.   Yes [provider]  ?acetaminophen (TYLENOL) 325 MG tablet Take 2 tablets (650 mg total) by mouth every 6 (six) hours as needed for mild pain, moderate pain or fever. 10/23/21   Pennie BanterGriffith, Kelly A, DO  ?fluticasone (FLONASE) 50 MCG/ACT nasal spray Place 2 sprays into both nostrils daily. 05/03/19 06/02/19  Enid Baasjie, Jude, MD  ?polyethylene glycol (MIRALAX / GLYCOLAX) 17 g packet Take 17 g by mouth daily as needed. 10/23/21   Pennie BanterGriffith, Kelly A, DO  ? ?  ? ?VITAL SIGNS:  ?Blood pressure (!) 152/95, pulse 74, temperature (!) 97.3 ?F (36.3 ?C), resp. rate 19, height 5\' 7"  (1.702 m), weight 73.1 kg, SpO2 96 %. ? ?PHYSICAL EXAMINATION:  ?Physical Exam ? ?GENERAL:  88 y.o.-year-old Caucasian female patient lying in the bed with no acute distress.  ?EYES: Pupils equal, round, reactive to light and accommodation. No scleral icterus. Extraocular muscles intact.  ?HEENT: Head atraumatic, normocephalic. Oropharynx and nasopharynx clear.  ?NECK:  Supple, no jugular venous distention. No thyroid enlargement, no tenderness.  ?LUNGS: Normal breath sounds bilaterally, no wheezing, rales,rhonchi or crepitation. No use of accessory muscles of respiration.   ?CARDIOVASCULAR: Regular rate and rhythm, S1, S2 normal. No murmurs, rubs, or gallops.  ?ABDOMEN: Soft, nondistended, nontender. Bowel sounds present. No organomegaly or mass.  ?EXTREMITIES: No pedal edema, cyanosis, or clubbing.  ?NEUROLOGIC: Cranial nerves II through XII are intact. Muscle strength 5/5 in all extremities. Sensation intact. Gait not checked.  ?PSYCHIATRIC: The patient is alert and cooperative.  Normal affect and good eye contact. ?SKIN: No obvious rash, lesion, or ulcer.  ? ?LABORATORY PANEL:  ? ?CBC ?Recent Labs  ?Lab 01/29/22 ?1921  ?WBC 10.8*  ?HGB 14.7  ?HCT 45.7  ?PLT 241  ? ?------------------------------------------------------------------------------------------------------------------ ? ?Chemistries  ?Recent Labs  ?Lab 01/29/22 ?1921  ?NA 138  ?K 3.9  ?CL 107  ?CO2 21*  ?GLUCOSE 170*  ?BUN 18  ?CREATININE 1.41*  ?CALCIUM 8.9  ?AST 27  ?ALT 20  ?ALKPHOS 74  ?BILITOT 0.6  ? ?------------------------------------------------------------------------------------------------------------------ ? ?Cardiac Enzymes ?No results for input(s): TROPONINI in the last 168 hours. ?------------------------------------------------------------------------------------------------------------------ ? ?RADIOLOGY:  ?DG Chest 2 View ? ?Result Date: 01/29/2022 ?CLINICAL DATA:  Suspected sepsis. EXAM: CHEST - 2 VIEW COMPARISON:  October 15, 2021 FINDINGS: AICD wires are again seen and unchanged in position. The  heart size and mediastinal contours are within normal limits. A radiopaque loop recorder device is noted. Low lung volumes are seen with mild, chronic appearing increased lung markings. Mild atelectasis is present within the bilateral lung bases. There is no evidence of a pleural effusion or pneumothorax. There is mild dextroscoliosis of the thoracic spine with multilevel degenerative changes. IMPRESSION: Low lung volumes with mild bibasilar atelectasis. Electronically Signed   By: Aram Candela M.D.   On:  01/29/2022 19:56  ? ?CT Head Wo Contrast ? ?Result Date: 01/29/2022 ?CLINICAL DATA:  Falls, difficulty walking EXAM: CT HEAD WITHOUT CONTRAST TECHNIQUE: Contiguous axial images were obtained from the base of t

## 2022-01-29 NOTE — ED Triage Notes (Signed)
Daughter providing history. Per daughter pt with confusion, falls, difficulty walking, strong odor of urine. Per daughter has been septic before. Pt alert to self only.  ?

## 2022-01-29 NOTE — ED Provider Notes (Signed)
? ?[facility] ?Provider Note ? ? ? Event Date/Time  ? First MD Initiated Contact with Patient 01/29/22 2209   ?  (approximate) ? ? ?History  ? ?Altered Mental Status ? ? ?HPI ? ?Kerri [name] is a 88 y.o. female with a history of dementia hypertension CKD who is brought to the ED due to confusion, foul urine, decreased oral intake, generalized weakness, and fall last night at home.  Patient lives alone but has assistance during the daytime.  When daughter arrived at the patient's home this morning, she found that the patient had fallen and was on the floor.  Patient denies any pain but does report chills. ?  ? ? ?Physical Exam  ? ?Triage Vital Signs: ?ED Triage Vitals  ?Enc Vitals Group  ?   BP 01/29/22 1900 (!) 134/105  ?   Pulse Rate 01/29/22 1900 80  ?   Resp 01/29/22 1900 14  ?   Temp 01/29/22 1900 97.8 ?F (36.6 ?C)  ?   Temp Source 01/29/22 1900 Oral  ?   SpO2 01/29/22 1900 93 %  ?   Weight 01/29/22 1901 168 lb (76.2 kg)  ?   Height 01/29/22 1901 5\' 7"  (1.702 m)  ?   Head Circumference --   ?   Peak Flow --   ?   Pain Score --   ?   Pain Loc --   ?   Pain Edu? --   ?   Excl. in GC? --   ? ? ?Most recent vital signs: ?Vitals:  ? 01/29/22 2100 01/29/22 2130  ?BP: (!) 142/74 124/69  ?Pulse: 65 72  ?Resp: 17 17  ?Temp:    ?SpO2: 96% 98%  ? ? ? ?General: Awake, no distress.  Oriented to self ?CV:  Good peripheral perfusion.  Regular rate and rhythm ?Resp:  Normal effort.  Clear to auscultation bilaterally ?Abd:  No distention.  Soft with suprapubic tenderness ?Other:  Full range of motion, no wounds ? ? ?ED Results / Procedures / Treatments  ? ?Labs ?(all labs ordered are listed, but only abnormal results are displayed) ?Labs Reviewed  ?COMPREHENSIVE METABOLIC PANEL - Abnormal; Notable for the following components:  ?    Result Value  ? CO2 21 (*)   ? Glucose, Bld 170 (*)   ? Creatinine, Ser 1.41 (*)   ? GFR, Estimated 36 (*)   ? All other components within normal limits  ?LACTIC ACID,  PLASMA - Abnormal; Notable for the following components:  ? Lactic Acid, Venous 2.2 (*)   ? All other components within normal limits  ?CBC WITH DIFFERENTIAL/PLATELET - Abnormal; Notable for the following components:  ? WBC 10.8 (*)   ? RBC 5.29 (*)   ? All other components within normal limits  ?URINALYSIS, ROUTINE W REFLEX MICROSCOPIC - Abnormal; Notable for the following components:  ? Color, Urine YELLOW (*)   ? APPearance HAZY (*)   ? Hgb urine dipstick MODERATE (*)   ? Nitrite POSITIVE (*)   ? Leukocytes,Ua MODERATE (*)   ? Bacteria, UA MANY (*)   ? All other components within normal limits  ?URINE CULTURE  ?LACTIC ACID, PLASMA  ?TROPONIN I (HIGH SENSITIVITY)  ?TROPONIN I (HIGH SENSITIVITY)  ? ? ? ?EKG ? ?Interpreted by me ?Ventricular paced rhythm, rate of 63.  Normal axis, left bundle branch block, no acute ischemic changes. ? ? ?RADIOLOGY ?Chest x-ray viewed and interpreted by me, unremarkable.  Radiology report reviewed. ? ?  CT head unremarkable ? ? ? ?PROCEDURES: ? ?Critical Care performed: No ? ?Procedures ? ? ?MEDICATIONS ORDERED IN ED: ?Medications  ?cefTRIAXone (ROCEPHIN) 1 g in sodium chloride 0.9 % 100 mL IVPB (1 g Intravenous New Bag/Given 01/29/22 2210)  ?sodium chloride 0.9 % bolus 1,000 mL (1,000 mLs Intravenous New Bag/Given 01/29/22 2105)  ? ? ? ?IMPRESSION / MDM / ASSESSMENT AND PLAN / ED COURSE  ?I reviewed the triage vital signs and the nursing notes. ?             ?               ? ?Differential diagnosis includes, but is not limited to, UTI, dehydration, anemia, electrolyte abnormality, pneumonia, dementia ? ? ? ?Patient presents with increased confusion, falls and weakness, not safe at home due to current acute decompensation.  This is a new acute issue.  She also has signs and symptoms suggestive of UTI. ? ?Patient given IV fluid bolus for hydration.  Urinalysis does show a clear UTI, nitrite positive.  Lactate is negative, white blood cell count is normal, creatinine is at baseline CKD.   Vital signs are unremarkable.  She is not septic. ? ?Due to her acute decompensation and decline in functional status in the setting of this illness, living alone and at high risk for further falls and serious injury, she will need to be hospitalized until improving.  Patient and her son at bedside agree.  Case discussed with the hospitalist.  IV Rocephin given. ?  ? ? ?FINAL CLINICAL IMPRESSION(S) / ED DIAGNOSES  ? ?Final diagnoses:  ?Cystitis  ?Dementia, unspecified dementia severity, unspecified dementia type, unspecified whether behavioral, psychotic, or mood disturbance or anxiety (HCC)  ?Generalized weakness  ? ? ? ?Rx / DC Orders  ? ?ED Discharge Orders   ? ? None  ? ?  ? ? ? ?Note:  This document was prepared using Dragon voice recognition software and may include unintentional dictation errors. ?  ?Sharman Cheek, MD ?01/29/22 2356 ? ?

## 2022-01-29 NOTE — ED Notes (Signed)
Elevated Lactic 2.2 ?

## 2022-01-30 DIAGNOSIS — G9341 Metabolic encephalopathy: Secondary | ICD-10-CM | POA: Diagnosis not present

## 2022-01-30 DIAGNOSIS — Z87440 Personal history of urinary (tract) infections: Secondary | ICD-10-CM

## 2022-01-30 DIAGNOSIS — N1832 Chronic kidney disease, stage 3b: Secondary | ICD-10-CM

## 2022-01-30 DIAGNOSIS — N390 Urinary tract infection, site not specified: Secondary | ICD-10-CM | POA: Diagnosis not present

## 2022-01-30 DIAGNOSIS — R531 Weakness: Secondary | ICD-10-CM | POA: Diagnosis not present

## 2022-01-30 LAB — BASIC METABOLIC PANEL
Anion gap: 5 (ref 5–15)
BUN: 18 mg/dL (ref 8–23)
CO2: 25 mmol/L (ref 22–32)
Calcium: 8.7 mg/dL — ABNORMAL LOW (ref 8.9–10.3)
Chloride: 113 mmol/L — ABNORMAL HIGH (ref 98–111)
Creatinine, Ser: 1.23 mg/dL — ABNORMAL HIGH (ref 0.44–1.00)
GFR, Estimated: 42 mL/min — ABNORMAL LOW (ref >60)
Glucose, Bld: 122 mg/dL — ABNORMAL HIGH (ref 70–99)
Potassium: 3.8 mmol/L (ref 3.5–5.1)
Sodium: 143 mmol/L (ref 135–145)

## 2022-01-30 LAB — CBC
HCT: 40.3 % (ref 36.0–46.0)
Hemoglobin: 13 g/dL (ref 12.0–15.0)
MCH: 28.3 pg (ref 26.0–34.0)
MCHC: 32.3 g/dL (ref 30.0–36.0)
MCV: 87.6 fL (ref 80.0–100.0)
Platelets: 186 10*3/uL (ref 150–400)
RBC: 4.6 MIL/uL (ref 3.87–5.11)
RDW: 14.5 % (ref 11.5–15.5)
WBC: 8.2 10*3/uL (ref 4.0–10.5)
nRBC: 0 % (ref 0.0–0.2)

## 2022-01-30 MED ORDER — SODIUM CHLORIDE 0.9 % IV SOLN
1.0000 g | INTRAVENOUS | Status: DC
  Administered 2022-01-30: 1 g via INTRAVENOUS
  Filled 2022-01-30: qty 1

## 2022-01-30 NOTE — Assessment & Plan Note (Addendum)
Secondary to UTI.  Mental status has improved.  Patient has some generalized weakness, will obtain PT eval, patient refused any possibility of SNF placement.  We will set up home PT and home RN. ?

## 2022-01-30 NOTE — Progress Notes (Signed)
?  Progress Note ? ? ?Patient: Kerri [name] [id number] DOB: 09-21-1931 DOA: 01/29/2022     1 ?DOS: the patient was seen and examined on 01/30/2022 ?  ?Brief hospital course: ?Kerri [name] is a 88 y.o. Caucasian female with medical history significant for essential hypertension, mixed Alzheimer's and vascular dementia, chronic systolic CHF, moderate mitral regurgitation and complete AV block s/p pacemaker placement, who presented to the emergency room with acute onset of altered mental status with confusion, generalized weakness and diminished p.o. intake.  She noted urinary frequency, urgency and admitted to dysuria without hematuria or flank pain.   ?She is diagnosed with UTI, started on Rocephin. ? ?Assessment and Plan: ?* Acute metabolic encephalopathy ?Secondary to UTI.  Condition seem to be improving today. ? ?Acute lower UTI ?Continue Rocephin, pending urine and blood cultures. ? ?Acute kidney injury superimposed on CKD (HCC)-resolved as of 01/30/2022 ?- We will hydrate with IV normal saline and follow BMP. ?- We will avoid nephrotoxins. ? ?Essential hypertension ?Continue home medicines ? ?Hypothyroidism ?Continue Synthroid. ? ?Dementia without behavioral disturbance ([place]) ?Stable, continue home medicines ? ?Chronic kidney disease, stage 3b ([place]) ?Acute kidney injury ruled out. ?Reviewed lab tests prior to hospitalization, patient had chronic kidney disease stage IIIb.  Patient does not meet criteria for acute kidney injury. ? ? ? ? ?  ? ?Subjective:  ?Patient doing much better today, she no longer has any confusion. ? ?Physical Exam: ?Vitals:  ? 01/29/22 2309 01/30/22 0549 01/30/22 0740 01/30/22 1134  ?BP: (!) 152/95 135/77 122/72 113/72  ?Pulse: 74 73 70 (!) 59  ?Resp: 19 19 16 16   ?Temp: (!) 97.3 ?F (36.3 ?C) (!) 97.5 ?F (36.4 ?C) 98.1 ?F (36.7 ?C) 98.5 ?F (36.9 ?C)  ?TempSrc:   Oral Oral  ?SpO2: 96% 93% 95% 94%  ?Weight: 73.1 kg     ?Height:      ? ?General exam: Appears calm and comfortable   ?Respiratory system: Clear to auscultation. Respiratory effort normal. ?Cardiovascular system: S1 & S2 heard, RRR. No JVD, murmurs, rubs, gallops or clicks. No pedal edema. ?Gastrointestinal system: Abdomen is nondistended, soft and nontender. No organomegaly or masses felt. Normal bowel sounds heard. ?Central nervous system: Alert and oriented. No focal neurological deficits. ?Extremities: Symmetric 5 x 5 power. ?Skin: No rashes, lesions or ulcers ?Psychiatry: Judgement and insight appear normal. Mood & affect appropriate.  ? ?Data Reviewed: ? ?Lab results reviewed. ? ?Family Communication: Son updated ? ?Disposition: ?Status is: Inpatient ?Remains inpatient appropriate because: Severity of disease, IV antibiotics ? Planned Discharge Destination: Home ? ? ? ?Time spent: 28 minutes ? ?Author: ?Sharen Hones, MD ?01/30/2022 12:52 PM ? ?For on call review [email or web address].  ?

## 2022-01-30 NOTE — Assessment & Plan Note (Signed)
-   We will hydrate with IV normal saline and follow BMP. - We will avoid nephrotoxins. 

## 2022-01-30 NOTE — Progress Notes (Signed)
PHARMACIST - PHYSICIAN COMMUNICATION ? ?CONCERNING:  Enoxaparin (Lovenox) for DVT Prophylaxis  ? ? ?RECOMMENDATION: ?Patient was prescribed enoxaprin 40mg  q24 hours for VTE prophylaxis.  ? ?Filed Weights  ? 01/29/22 1901  ?Weight: 76.2 kg (168 lb)  ? ? ?Body mass index is 26.31 kg/m?. ? ?Estimated Creatinine Clearance: 29.3 mL/min (A) (by C-G formula based on SCr of 1.41 mg/dL (H)). ? ?Patient is candidate for enoxaparin 30mg  every 24 hours based on CrCl <59ml/min or Weight <45kg ? ?DESCRIPTION: ?Pharmacy has adjusted enoxaparin dose per [facility] policy. ? ?Patient is now receiving enoxaparin 30 mg every 24 hours  ? ?Renda Rolls, PharmD, MBA ?01/30/2022 ?12:06 AM ? ?

## 2022-01-30 NOTE — Assessment & Plan Note (Addendum)
Continue home medicines. °

## 2022-01-30 NOTE — Assessment & Plan Note (Addendum)
Stable, continue home medicines ?

## 2022-01-30 NOTE — Assessment & Plan Note (Addendum)
Continue Synthroid °

## 2022-01-30 NOTE — Assessment & Plan Note (Addendum)
Acute kidney injury ruled out. ?Reviewed lab tests prior to hospitalization, patient had chronic kidney disease stage IIIb.  Patient does not meet criteria for acute kidney injury. ?Condition is stable. ?

## 2022-01-30 NOTE — Hospital Course (Signed)
Kerri [name] is a 88 y.o. Caucasian female with medical history significant for essential hypertension, mixed Alzheimer's and vascular dementia, chronic systolic CHF, moderate mitral regurgitation and complete AV block s/p pacemaker placement, who presented to the emergency room with acute onset of altered mental status with confusion, generalized weakness and diminished p.o. intake.  She noted urinary frequency, urgency and admitted to dysuria without hematuria or flank pain.   ?She is diagnosed with UTI, started on Rocephin. ?

## 2022-01-30 NOTE — Assessment & Plan Note (Addendum)
Urine culture still pending, patient condition had improved.  She received Rocephin since admission.  At this point, she does not have sepsis, will give a dose of fosfomycin before discharge and complete antibiotics. ?

## 2022-01-31 DIAGNOSIS — R531 Weakness: Secondary | ICD-10-CM | POA: Diagnosis not present

## 2022-01-31 DIAGNOSIS — G9341 Metabolic encephalopathy: Secondary | ICD-10-CM | POA: Diagnosis not present

## 2022-01-31 DIAGNOSIS — N390 Urinary tract infection, site not specified: Secondary | ICD-10-CM | POA: Diagnosis not present

## 2022-01-31 DIAGNOSIS — N1832 Chronic kidney disease, stage 3b: Secondary | ICD-10-CM | POA: Diagnosis not present

## 2022-01-31 MED ORDER — FOSFOMYCIN TROMETHAMINE 3 G PO PACK
3.0000 g | PACK | Freq: Once | ORAL | Status: AC
Start: 2022-01-31 — End: 2022-01-31
  Administered 2022-01-31: 3 g via ORAL
  Filled 2022-01-31: qty 3

## 2022-01-31 NOTE — TOC Initial Note (Signed)
Transition of Care (TOC) - Initial/Assessment Note  ? ? ?Patient Details  ?Name: Kerri [name] ?MRN: [id number] ?Date of Birth: [birth year] ? ?Transition of Care (TOC) CM/SW Contact:    ?Izola Price, RN ?Phone Number: ?01/31/2022, 9:54 AM ? ?Clinical Narrative:  4/23: Spoke with patient and then with son, Kerri [name]. Patient has transportation to appointments and family does shopping for her. PCP is  Dr. Grayland Ormond, confirmed, and RX is [facility] [address]. NO issues obtaining medications. Patient has had [place] services in the past with [facility] per daughter in law (wife of Kerri [name]). Daughter in law is primary care taker at patient's home from 8-9 am and 4-5 pm with private care services in place prior to this admission. Son also indicated they had all needed DME . Kerri Davies RN Kerri [name] [phone].          ? ? ?  ?  ? ? ?Patient Goals and CMS Choice ?  ?  ?  ? ?Expected Discharge Plan and Services ?  ?  ?  ?  ?  ?Expected Discharge Date: 01/31/22               ?  ?  ?  ?  ?  ?  ?  ?  ?  ?  ? ?Prior Living Arrangements/Services ?  ?  ?  ?       ?  ?  ?  ?  ? ?Activities of Daily Living ?Home Assistive Devices/Equipment: Shower chair with back ?ADL Screening (condition at time of admission) ?Patient's cognitive ability adequate to safely complete daily activities?: No ?Is the patient deaf or have difficulty hearing?: No ?Does the patient have difficulty seeing, even when wearing glasses/contacts?: No ?Does the patient have difficulty concentrating, remembering, or making decisions?: Yes ?Patient able to express need for assistance with ADLs?: No ?Does the patient have difficulty dressing or bathing?: Yes ?Independently performs ADLs?: No ?Communication: Independent ?Dressing (OT): Needs assistance ?Is this a change from baseline?: Pre-admission baseline ?Grooming: Independent ?Feeding: Independent ?Bathing: Needs assistance ?Is this a change from baseline?: Pre-admission baseline ?Toileting:  Independent ?In/Out Bed: Independent ?Walks in Home: Independent ?Does the patient have difficulty walking or climbing stairs?: Yes ?Weakness of Legs: Both (no worsening) ?Weakness of Arms/Hands: Both (no worsening) ? ?Permission Sought/Granted ?  ?  ?   ?   ?   ?   ? ?Emotional Assessment ?  ?  ?  ?  ?  ?  ? ?Admission diagnosis:  Generalized weakness [R53.1] ?Cystitis [N30.90] ?Acute metabolic encephalopathy 99991111 ?Dementia, unspecified dementia severity, unspecified dementia type, unspecified whether behavioral, psychotic, or mood disturbance or anxiety ([place]) [F03.90] ?Patient Active Problem List  ? Diagnosis Date Noted  ? Acute lower UTI 01/30/2022  ? Acute metabolic encephalopathy [id number]  ? Thrush   ? Constipation   ? UTI (urinary tract infection) 10/15/2021  ? Leukocytosis 10/15/2021  ? Hypokalemia 10/15/2021  ? Hyponatremia 10/15/2021  ? Elevated brain natriuretic peptide (BNP) level 10/15/2021  ? Generalized weakness 10/15/2021  ? Chronic kidney disease, stage 3b ([place]) 10/15/2021  ? 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 Dementia without behavioral disturbance ([place]) 10/15/2021  ? Hypothyroidism 10/15/2021  ? Essential hypertension 10/15/2021  ? Overactive bladder 10/15/2021  ? CHB (complete heart block) ([place]) 01/07/2021  ? Sepsis ([place]) 05/01/2019  ? ?PCP:  Donnamarie Rossetti, PA-C ?Pharmacy:   ?[id number] [address], [place] ?  [place] [address] ?Phone: [phone] Fax: [phone] ? ?[place], [place] [address] ?[address] ?[place] [address] ?Phone: [phone] Fax: [phone] ? ? ? ? ?Social Determinants of Health (SDOH) Interventions ?  ? ?Readmission Risk Interventions ?   ? View : No data to display.  ?  ?  ?  ? ? ? ?

## 2022-01-31 NOTE — Progress Notes (Signed)
Patient has been discharged home. Discharge papers given and explained to patient's daughter, Annabelle Harman. She verbalized understanding. Meds and f/u appointment reviewed.  ?

## 2022-01-31 NOTE — Discharge Summary (Addendum)
?Physician Discharge Summary ?  ?Patient: Kerri [name] MRN: [id number] DOB: 1931/06/04  ?Admit date:     01/29/2022  ?Discharge date: 01/31/22  ?Discharge Physician: Sharen Hones  ? ?PCP: Donnamarie Rossetti, PA-C  ? ?Recommendations at discharge:  ? ?Follow-up with PCP in 1 week ? ?Discharge Diagnoses: ?Principal Problem: ?  Acute metabolic encephalopathy ?Active Problems: ?  Acute lower UTI ?  Chronic kidney disease, stage 3b ([place]) ?  Dementia without behavioral disturbance ([place]) ?  Hypothyroidism ?  Essential hypertension ? ?Resolved Problems: ?  * No resolved hospital problems. * ? ?Hospital Course: ?Kerri [name] is a 88 y.o. Caucasian female with medical history significant for essential hypertension, mixed Alzheimer's and vascular dementia, chronic systolic CHF, moderate mitral regurgitation and complete AV block s/p pacemaker placement, who presented to the emergency room with acute onset of altered mental status with confusion, generalized weakness and diminished p.o. intake.  She noted urinary frequency, urgency and admitted to dysuria without hematuria or flank pain.   ?She is diagnosed with UTI, started on Rocephin. ? ?Assessment and Plan: ?* Acute metabolic encephalopathy ?Secondary to UTI.  Mental status has improved.  Patient has some generalized weakness, will obtain PT eval, patient refused any possibility of SNF placement.  We will set up home PT and home RN. ? ?Acute lower UTI ?Urine culture still pending, patient condition had improved.  She received Rocephin since admission.  At this point, she does not have sepsis, will give a dose of fosfomycin before discharge and complete antibiotics. ? ?Essential hypertension ?Continue home medicines ? ?Hypothyroidism ?Continue Synthroid. ? ?Dementia without behavioral disturbance ([place]) ?Stable, continue home medicines ? ?Chronic kidney disease, stage 3b ([place]) ?Acute kidney injury ruled out. ?Reviewed lab tests prior to hospitalization, patient had  chronic kidney disease stage IIIb.  Patient does not meet criteria for acute kidney injury. ?Condition is stable. ? ? ? ? ?  ? ? ?Consultants: None ?Procedures performed: None  ?Disposition: Home health ?Diet recommendation:  ?Discharge Diet Orders (From admission, onward)  ? ?  Start     Ordered  ? 01/31/22 0000  Diet - low sodium heart healthy       ? 01/31/22 [id number]  ? ?  ?  ? ?  ? ?Cardiac diet ?DISCHARGE MEDICATION: ?Allergies as of 01/31/2022   ? ?   Reactions  ? Alendronate Sodium Other (See Comments)  ? Bupropion Other (See Comments)  ? Tremors   ? Rofecoxib Diarrhea  ? Alendronate Rash  ? ?  ? ?  ?Medication List  ?  ? ?TAKE these medications   ? ?acetaminophen 325 MG tablet ?Commonly known as: TYLENOL ?Take 2 tablets (650 mg total) by mouth every 6 (six) hours as needed for mild pain, moderate pain or fever. ?  ?CALCIUM-CARB 600 + D PO ?Take 1 tablet by mouth daily. ?  ?cyanocobalamin 1000 MCG/ML injection ?Commonly known as: (VITAMIN B-12) ?Inject 1,000 mcg into the muscle every 30 (thirty) days. ?  ?donepezil 10 MG tablet ?Commonly known as: ARICEPT ?Take 10 mg by mouth at bedtime. ?  ?fluticasone 50 MCG/ACT nasal spray ?Commonly known as: FLONASE ?Place 2 sprays into both nostrils daily. ?  ?folic acid 1 MG tablet ?Commonly known as: FOLVITE ?Take 1 mg by mouth daily. ?  ?levothyroxine 50 MCG tablet ?Commonly known as: SYNTHROID ?Take 50 mcg by mouth daily before breakfast. ?  ?memantine 10 MG tablet ?Commonly known as: NAMENDA ?Take 10 mg by mouth 2 (two) times daily. ?  ?  metoprolol succinate 25 MG 24 hr tablet ?Commonly known as: TOPROL-XL ?Take 1 tablet (25 mg total) by mouth daily. ?  ?polyethylene glycol 17 g packet ?Commonly known as: MIRALAX / GLYCOLAX ?Take 17 g by mouth daily as needed. ?  ?tolterodine 4 MG 24 hr capsule ?Commonly known as: DETROL LA ?Take 4 mg by mouth daily. ?  ?trimethoprim 100 MG tablet ?Commonly known as: TRIMPEX ?Take 100 mg by mouth daily. ?  ?venlafaxine XR 75 MG 24 hr  capsule ?Commonly known as: EFFEXOR-XR ?Take 75 mg by mouth daily with breakfast. ?  ?Vitamin D (Ergocalciferol) 1.25 MG (50000 UNIT) Caps capsule ?Commonly known as: DRISDOL ?Take 50,000 Units by mouth every 7 (seven) days. ?  ? ?  ? ? Follow-up Information   ? ? [place], [employer], PA-C Follow up in 1 week(s).   ?Specialty: Family Medicine ?Contact information: ?[address] ?[place] [address] ?[phone] ? ? ?  ?  ? ?  ?  ? ?  ? ?Discharge Exam: ?Filed Weights  ? 01/29/22 1901 01/29/22 2309  ?Weight: 76.2 kg 73.1 kg  ? ?General exam: Appears calm and comfortable  ?Respiratory system: Clear to auscultation. Respiratory effort normal. ?Cardiovascular system: S1 & S2 heard, RRR. No JVD, murmurs, rubs, gallops or clicks. No pedal edema. ?Gastrointestinal system: Abdomen is nondistended, soft and nontender. No organomegaly or masses felt. Normal bowel sounds heard. ?Central nervous system: Alert and oriented x2. No focal neurological deficits. ?Extremities: Symmetric 5 x 5 power. ?Skin: No rashes, lesions or ulcers ?Psychiatry: Judgement and insight appear normal. Mood & affect appropriate.  ? ? ?Condition at discharge: good ? ?The results of significant diagnostics from this hospitalization (including imaging, microbiology, ancillary and laboratory) are listed below for reference.  ? ?Imaging Studies: ?DG Chest 2 View ? ?Result Date: 01/29/2022 ?CLINICAL DATA:  Suspected sepsis. EXAM: CHEST - 2 VIEW COMPARISON:  October 15, 2021 FINDINGS: AICD wires are again seen and unchanged in position. The heart size and mediastinal contours are within normal limits. A radiopaque loop recorder device is noted. Low lung volumes are seen with mild, chronic appearing increased lung markings. Mild atelectasis is present within the bilateral lung bases. There is no evidence of a pleural effusion or pneumothorax. There is mild dextroscoliosis of the thoracic spine with multilevel degenerative changes. IMPRESSION: Low  lung volumes with mild bibasilar atelectasis. Electronically Signed   By: Virgina Norfolk M.D.   On: 01/29/2022 19:56  ? ?CT Head Wo Contrast ? ?Result Date: 01/29/2022 ?CLINICAL DATA:  Falls, difficulty walking EXAM: CT HEAD WITHOUT CONTRAST TECHNIQUE: Contiguous axial images were obtained from the base of the skull through the vertex without intravenous contrast. RADIATION DOSE REDUCTION: This exam was performed according to the departmental dose-optimization program which includes automated exposure control, adjustment of the mA and/or kV according to patient size and/or use of iterative reconstruction technique. COMPARISON:  10/15/2021 FINDINGS: Brain: No evidence of acute infarction, hemorrhage, extra-axial collection or mass lesion/mass effect. Stable cortical and central atrophy. Stable moderate ventriculomegaly. Extensive small vessel ischemic changes. Vascular: Mild intracranial atherosclerosis. Skull: Normal. Negative for fracture or focal lesion. Sinuses/Orbits: The visualized paranasal sinuses are essentially clear. The mastoid air cells are unopacified. Other: None. IMPRESSION: No evidence of acute intracranial abnormality. Extensive small vessel ischemic changes. Stable moderate ventriculomegaly.  Correlate for NPH. Electronically Signed   By: Julian Hy M.D.   On: 01/29/2022 22:10   ? ?Microbiology: ?Results for orders placed or performed during the hospital encounter of  10/15/21  ?Resp Panel by RT-PCR (Flu A&B, Covid) Nasopharyngeal Swab     Status: None  ? Collection Time: 10/15/21  2:33 AM  ? Specimen: Nasopharyngeal Swab; Nasopharyngeal(NP) swabs in vial transport medium  ?Result Value Ref Range Status  ? SARS Coronavirus 2 by RT PCR NEGATIVE NEGATIVE Final  ?  Comment: (NOTE) ?SARS-CoV-2 target nucleic acids are NOT DETECTED. ? ?The SARS-CoV-2 RNA is generally detectable in upper respiratory ?specimens during the acute phase of infection. The lowest ?concentration of SARS-CoV-2 viral  copies this assay can detect is ?138 copies/mL. A negative result does not preclude SARS-Cov-2 ?infection and should not be used as the sole basis for treatment or ?other patient management decisions. A negati

## 2022-01-31 NOTE — TOC Progression Note (Signed)
Transition of Care (TOC) - Progression Note  ? ? ?Patient Details  ?Name: Kerri [name] ?MRN: [id number] ?Date of Birth: 1931/01/31 ? ?Transition of Care (TOC) CM/SW Contact  ?Bing Quarry, RN ?Phone Number: ?01/31/2022, 10:12 AM ? ?Clinical Narrative:  4/23: After PT evaluation/concerns and recommendations for 24 hour supervision as specific safety need for DC to home with [facility], CM spoke again to son, Dorann Davidson, at [phone], regarding the 24 hour supervision needs for patient safety. Son confirmed the family had been discussing this and would provider the necessary 24 hour supervision and were made aware of specific concerns such as gait issues, patient safety awareness, memory issues related to safety issues, and need for someone to be available 24 hours, and especially during the night time hours.  ? ?Daughter-in-law also confirmed this in background on speaker phone conversation.  Pt recommends OT consult prior to discharge and OT added to [facility] order by provider.  ? ?[facility] was the prior agency and preferred choice. [facility] via [facility] has accepted for [facility] services. Again, son stated they had all needed DME from walker, BSC, and shower chair at home to assist in patient care as well as aforementioned private care services in the home (in the initial TOC note).  ? ?A future need for a H[place] lift was identified and CM referred them to PCP post discharge if need arises. Today's RN CM phone number given to son if further questions or concerns arise today prior to discharge. Family/Son will provide transportation to home on discharge.  ? ?Care team updated via secure chat of this follow up conversation related to need for 24 hour supervision for safe discharge plan.  ? Gabriel Cirri RN Caryl Ada [phone].  ? ? ? ?  ?  ? ?Expected Discharge Plan and Services ?  ?  ?  ?  ?  ?Expected Discharge Date: 01/31/22               ?  ?  ?  ?  ?  ?  ?  ?  ?  ?  ? ? ?Social Determinants of Health (SDOH)  Interventions ?  ? ?Readmission Risk Interventions ?   ? View : No data to display.  ?  ?  ?  ? ? ?

## 2022-01-31 NOTE — TOC Transition Note (Addendum)
Transition of Care (TOC) - CM/SW Discharge Note ? ? ?Patient Details  ?Name: Kerri [name] ?MRN: [id number] ?Date of Birth: [birth year] ? ?Transition of Care (TOC) CM/SW Contact:  ?Bing Quarry, RN ?Phone Number: ?01/31/2022, 10:35 AM ? ? ?Clinical Narrative:   4/23: Patient is to be discharged today with home health services requiring 24 hour supervision per PT evaluation/safe discharge pan, which family has confirmed they will provide in addition to family care and private care services already in place. Discussed specific safety needs for this 24 hour supervision and son and daughter in law voiced understanding and that they had been discussing this as well and again confirmed they will provide/arrange for this supervision. Patient has all DME needed at home per son. Son will provide transportation home at discharge.  [facility] will accepted patient per [facility]. Gabriel Cirri RN CM [phone] ? ? ? ? ?Final next level of care: Home w Home Health Services ?Barriers to Discharge: Barriers Resolved ? ? ?Patient Goals and CMS Choice ?  ?  ?Choice offered to / list presented to : Patient, Adult Children ? ?Discharge Placement ?  ?           ?  ?  ?  ?Patient and family notified of of transfer: 01/31/22 (Son notified to discharge today. He will provide transportation for patient to home.) ? ?Discharge Plan and Services ?  ?  ?           ?DME Arranged:  (Son indicated they had all needed equipment at home.) ?DME Agency: NA ?  ?  ?  ?HH Arranged: RN, OT, PT ?HH Agency: [facility] ?Date HH Agency Contacted: 01/31/22 ?Time HH Agency Contacted: 1029 ?Representative spoke with at [facility] Agency: [facility] ? ?Social Determinants of Health (SDOH) Interventions ?  ? ? ?Readmission Risk Interventions ?   ? View : No data to display.  ?  ?  ?  ? ? ? ? ? ?

## 2022-01-31 NOTE — Evaluation (Signed)
Physical Therapy Evaluation ?Patient Details ?Name: Kerri [name] ?MRN: [id number] ?DOB: November 22, 1930 ?Today's Date: 01/31/2022 ? ?History of Present Illness ? Pt is an 88 y/o F admitted on 01/29/22 after presenting to the ED with c/o acute onset of AMS with confusion, generalized weakness & diminised PO intake, as well as urinary frequency, urgency & dysuria. Pt is being treated for acute metabolic encephalopathy & lower UTI. PMH: HTN, mixed Alzheimer's & vascular dementia, chronic systolic CHF, moderate mitral regurgitation & complete AV block s/p pacemaker placement  ?Clinical Impression ? Pt seen for PT evaluation with pt demonstrating confusion but unsure if this is baseline as no family/caregivers present for session. Pt reports she lives alone, ambulates with RW, and endorses multiple falls. Pt reports her family provides meals but unable to describe if she has to microwave meals or still prep meals, only stating "I have food. I eat". Case manager later reports family states they provide 1 hr assistance in AM & PM & private care is provided during those hours. On this date pt requires supervision for bed mobility but demonstrates reliance on HOB elevated & bed rails. Pt requires min assist for STS from stable surfaces as she posteriorly leans on bed/recliner; anticipate pt would require additional assistance for STS from standard chair. Pt ambulates 20 ft in room with RW & min assist but significantly impaired gait pattern as noted below, and poor awareness/proper use of AD despite multi modal cuing.  At this time pt is absolutely unsafe to d/c home alone, even in night time hours, because if pt were to need to toilet at night she is a very high fall risk.   ? ?Recommendations for follow up therapy are one component of a multi-disciplinary discharge planning process, led by the attending physician.  Recommendations may be updated based on patient status, additional functional criteria and insurance  authorization. ? ?Follow Up Recommendations Home health PT ? ?  ?Assistance Recommended at Discharge Frequent or constant Supervision/Assistance  ?Patient can return home with the following ? A little help with walking and/or transfers;A little help with bathing/dressing/bathroom;Assistance with cooking/housework;Direct supervision/assist for financial management;Assist for transportation;Direct supervision/assist for medications management;Help with stairs or ramp for entrance ? ?  ?Equipment Recommendations None recommended by PT  ?Recommendations for Other Services ? OT consult  ?  ?Functional Status Assessment Patient has had a recent decline in their functional status and demonstrates the ability to make significant improvements in function in a reasonable and predictable amount of time.  ? ?  ?Precautions / Restrictions Precautions ?Precautions: Fall ?Restrictions ?Weight Bearing Restrictions: No  ? ?  ? ?Mobility ? Bed Mobility ?Overal bed mobility: Needs Assistance ?Bed Mobility: Supine to Sit ?  ?  ?Supine to sit: Supervision, HOB elevated ?  ?  ?General bed mobility comments: extra time to upright self & correct balance to sit EOB, uses bed rails ?  ? ?Transfers ?Overall transfer level: Needs assistance ?Equipment used: Rolling walker (2 wheels) ?Transfers: Sit to/from Stand ?Sit to Stand: Min assist ?  ?  ?  ?  ?  ?General transfer comment: Cuing for safe hand placement, strong posterior lean on sitting surface & difficulty pushing to stand. ?  ? ?Ambulation/Gait ?Ambulation/Gait assistance: Min assist ?Gait Distance (Feet): 20 Feet ?Assistive device: Rolling walker (2 wheels) ?Gait Pattern/deviations: Step-to pattern, Decreased step length - right, Decreased step length - left, Decreased stride length, Decreased dorsiflexion - right, Decreased dorsiflexion - left, Decreased weight shift to right ?Gait velocity: decreased ?  ?  ?  General Gait Details: Pt pushes RW out in front of her with poor awareness of  safe use of AD. Pt drags R foot behind her with PT providing tactille/verbal cuing for increased step length BLE. Pt maintains BLE knee flexed throughout mobility with poor ability to extend knees & upright posture. ? ?Stairs ?  ?  ?  ?  ?  ? ?Wheelchair Mobility ?  ? ?Modified Rankin (Stroke Patients Only) ?  ? ?  ? ?Balance Overall balance assessment: Needs assistance, History of Falls ?Sitting-balance support: Feet supported, Bilateral upper extremity supported ?Sitting balance-Leahy Scale: Fair ?Sitting balance - Comments: supervision static sitting ?  ?Standing balance support: Bilateral upper extremity supported, During functional activity ?Standing balance-Leahy Scale: Poor ?  ?  ?  ?  ?  ?  ?  ?  ?  ?  ?  ?  ?   ? ? ? ?Pertinent Vitals/Pain Pain Assessment ?Pain Assessment: No/denies pain  ? ? ?Home Living Family/patient expects to be discharged to:: Private residence ?Living Arrangements: Alone ?Available Help at Discharge: Family;Available PRN/intermittently ?Type of Home: House ?Home Access: Stairs to enter ?Entrance Stairs-Rails: Can reach both ?Entrance Stairs-Number of Steps: 3 to front, garage is flat entrance ?  ?Home Layout: One level;Laundry or work area in basement;Able to live on main level with bedroom/bathroom ?Home Equipment: Rolling Walker (2 wheels);BSC/3in1;Cane - single point;Grab bars - tub/shower;Shower seat - built in ?   ?  ?Prior Function Prior Level of Function : Needs assist ?  ?  ?  ?  ?  ?  ?Mobility Comments: Pt reports she's ambulatory with RW but endorses "I fall a lot". ?ADLs Comments: Pt reports her kids assist with transportation, bring her her medications, do laundry in basement of home, provide meals. ?  ? ? ?Hand Dominance  ?   ? ?  ?Extremity/Trunk Assessment  ? Upper Extremity Assessment ?Upper Extremity Assessment: Generalized weakness ?  ? ?Lower Extremity Assessment ?Lower Extremity Assessment: Generalized weakness ?  ? ?Cervical / Trunk Assessment ?Cervical / Trunk  Assessment: Kyphotic  ?Communication  ?    ?Cognition Arousal/Alertness: Awake/alert ?Behavior During Therapy: [facility] for tasks assessed/performed ?Overall Cognitive Status: No family/caregiver present to determine baseline cognitive functioning ?  ?  ?  ?  ?  ?  ?  ?  ?  ?  ?  ?  ?  ?  ?  ?  ?General Comments: Pt oriented to name & birthday & location but unbale to recall age, nor month or year, nor reason for hospitalization. Pt follows simple commands throughout session with extra time. Pt with poor safety awareness & recall during session. ?  ?  ? ?  ?General Comments General comments (skin integrity, edema, etc.): Pt requires extra time to open tray containers on meal tray. ? ?  ?Exercises Other Exercises ?Other Exercises: Pt performs 5x STS from recliner with cuing for hand placement with focus on BLE strengthening but pt continuing to lean posteriorly on recliner & demonstrating poor eccentric control during stand>sit.  ? ?Assessment/Plan  ?  ?PT Assessment Patient needs continued PT services  ?PT Problem List Decreased strength;Decreased mobility;Decreased safety awareness;Decreased coordination;Decreased activity tolerance;Decreased cognition;Decreased knowledge of use of DME;Decreased balance ? ?   ?  ?PT Treatment Interventions DME instruction;Modalities;Cognitive remediation;Therapeutic activities;Gait training;Therapeutic exercise;Patient/family education;Stair training;Balance training;Functional mobility training;Manual techniques;Neuromuscular re-education   ? ?PT Goals (Current goals can be found in the Care Plan section)  ?Acute Rehab PT Goals ?Patient Stated Goal: go home ?PT  Goal Formulation: With patient ?Time For Goal Achievement: 02/14/22 ?Potential to Achieve Goals: Fair ? ?  ?Frequency Min 2X/week ?  ? ? ?Co-evaluation   ?  ?  ?  ?  ? ? ?  ?AM-PAC PT "6 Clicks" Mobility  ?Outcome Measure Help needed turning from your back to your side while in a flat bed without using bedrails?: None ?Help  needed moving from lying on your back to sitting on the side of a flat bed without using bedrails?: A Little ?Help needed moving to and from a bed to a chair (including a wheelchair)?: A Little ?Help needed standing

## 2022-02-01 LAB — URINE CULTURE: Culture: >=100000 — AB

## 2022-03-17 ENCOUNTER — Ambulatory Visit: Payer: Medicare Other | Admitting: Urology

## 2022-03-17 VITALS — BP 138/74 | HR 62 | Wt 160.0 lb

## 2022-03-17 DIAGNOSIS — Z87440 Personal history of urinary (tract) infections: Secondary | ICD-10-CM

## 2022-03-17 DIAGNOSIS — N3281 Overactive bladder: Secondary | ICD-10-CM

## 2022-03-17 DIAGNOSIS — N390 Urinary tract infection, site not specified: Secondary | ICD-10-CM

## 2022-03-17 LAB — BLADDER SCAN AMB NON-IMAGING

## 2022-03-17 MED ORDER — ESTRADIOL 0.1 MG/GM VA CREA: TOPICAL_CREAM | VAGINAL | 12 refills | Status: DC

## 2022-03-17 MED ORDER — MIRABEGRON ER 25 MG PO TB24: 25.0000 mg | ORAL_TABLET | Freq: Every day | ORAL | 0 refills | Status: DC

## 2022-03-17 NOTE — Progress Notes (Signed)
   03/17/22 1:23 PM   [name] 12-09-1930 [id number]  CC: Recurrent UTI, OAB  HPI: 88 year old female with history of dementia and recurrent UTIs over the last 9 months, these are culture documented in care everywhere and include Morganella in September 2022, E. coli in December 2022, Enterococcus in April and May 2023.  She was hospitalized in April with confusion felt to be secondary to UTI.  Her primary symptoms at that time are urgency and frequency.  She does think her symptoms improved with antibiotics.  History is obtained primarily from her son-in-law who is here with her today, and limited secondary to her dementia.  She was recently started on trimethoprim daily prophylaxis.  She denies UTI symptoms today.  She has been on Detrol long-term for baseline OAB symptoms.   PMH: Past Medical History:  Diagnosis Date   Arthritis     Surgical History: Past Surgical History:  Procedure Laterality Date   PACEMAKER LEADLESS INSERTION N/A 01/07/2021   Procedure: PACEMAKER LEADLESS INSERTION;  Surgeon: Marcina Millard, MD;  Location: [facility] IN[facility]ASIVE [facility];  Service: Cardiovascular;  Laterality: N/A;   PPM GENERATOR REMOVAL N/A 01/07/2021   Procedure: PPM GENERATOR REMOVAL;  Surgeon: Marcina Millard, MD;  Location: [facility];  Service: Cardiovascular;  Laterality: N/A;     Family History: No family history on file.  Social History:  reports that she has never smoked. She has never used smokeless tobacco. She reports that she does not drink alcohol and does not use drugs.   Laboratory Data: Culture data reviewed, see HPI  Pertinent Imaging: I have personally viewed and interpreted the KUB from January 2023 that shows excreted IV contrast in the collecting systems from prior CT chest with contrast, but no urologic abnormalities.  Assessment & Plan:   88 year old female with dementia and recurrent UTIs as well as some baseline overactive bladder.  I  recommended discontinuing the Detrol with her baseline confusion and age, secondary to increased risk of confusion and fall risk.  Samples of Myrbetriq were given, and can consider using this long-term if improvement in the OAB symptoms.  We discussed the evaluation and treatment of patients with recurrent UTIs at length.  We specifically discussed the differences between asymptomatic bacteriuria and true urinary tract infection.  We discussed the AUA definition of recurrent UTI of at least 2 culture proven symptomatic acute cystitis episodes in a 77-month period, or 3 within a 1 year period.  We discussed the importance of culture directed antibiotic treatment, and antibiotic stewardship.  First-line therapy includes nitrofurantoin(5 days), Bactrim(3 days), or fosfomycin(3 g single dose).  Possible etiologies of recurrent infection include periurethral tissue atrophy in postmenopausal woman, constipation, sexual activity, incomplete emptying, anatomic abnormalities, and even genetic predisposition.  Finally, we discussed the role of perineal hygiene, timed voiding, adequate hydration, topical vaginal estrogen, cranberry prophylaxis, and low-dose antibiotic prophylaxis.  -Continue trimethoprim prophylaxis, consider changing to nitrofurantoin prophylaxis based on recent Enterococcus if recurrent infections -Start topical estrogen cream and cranberry tablets -RTC 3 months symptom check   Legrand Rams, MD 03/17/2022  [facility] [address] [place], [place] [address] [phone]

## 2022-03-25 MED ORDER — NITROFURANTOIN MONOHYD MACRO 100 MG PO CAPS: 100.0000 mg | ORAL_CAPSULE | Freq: Two times a day (BID) | ORAL | 0 refills | Status: AC

## 2022-03-25 MED ORDER — NITROFURANTOIN MACROCRYSTAL 50 MG PO CAPS: 50.0000 mg | ORAL_CAPSULE | Freq: Every day | ORAL | 0 refills | Status: DC

## 2022-03-25 NOTE — Telephone Encounter (Signed)
Sent mychart message rx sent to pharmacy by e-script

## 2022-04-09 ENCOUNTER — Telehealth: Payer: Self-pay | Admitting: Urology

## 2022-04-09 DIAGNOSIS — N3281 Overactive bladder: Secondary | ICD-10-CM

## 2022-04-09 MED ORDER — MIRABEGRON ER 25 MG PO TB24: 25.0000 mg | ORAL_TABLET | Freq: Every day | ORAL | 11 refills | Status: DC

## 2022-04-09 NOTE — Telephone Encounter (Signed)
Pt's daughter, Synetta Fail, called office and said pt is on last week of Myrbetriq 25 mg samples and wants to know if they can either get more samples or get an RX sent in.

## 2022-04-09 NOTE — Telephone Encounter (Signed)
RX sent

## 2022-04-16 MED ORDER — MIRABEGRON ER 25 MG PO TB24: 25.0000 mg | ORAL_TABLET | Freq: Every day | ORAL | 11 refills | Status: DC

## 2022-04-16 NOTE — Addendum Note (Signed)
Addended by: Frankey Shown on: 04/16/2022 11:51 AM   Modules accepted: Orders

## 2022-04-16 NOTE — Telephone Encounter (Signed)
Pharmacy changed. RCX sent.

## 2022-04-16 NOTE — Telephone Encounter (Signed)
Patient's daughter called the office.  They are requesting Rx to to be sent to [facility] pharmacy.   Samples left at front desk for them to pick up.

## 2022-05-13 ENCOUNTER — Telehealth: Payer: Self-pay | Admitting: Urology

## 2022-05-13 NOTE — Telephone Encounter (Signed)
Called pt's daughter advised her that 1 month of samples would be left at the front desk for pick up. Advised that samples are not a feasible long term solution.

## 2022-05-13 NOTE — Telephone Encounter (Signed)
Pt would like to get more samples of Myrbetriq 25 mg.  She was told she could get more samples since RX was so expensive.

## 2022-05-26 ENCOUNTER — Other Ambulatory Visit: Payer: Self-pay | Admitting: Urology

## 2022-06-11 ENCOUNTER — Telehealth: Payer: Self-pay | Admitting: Urology

## 2022-06-11 NOTE — Telephone Encounter (Signed)
Daughter in law, Synetta Fail called, will be out of Myrbetriq  25mg  and is requesting some more samples. can stop by today 9/1 to pick the samples up.

## 2022-06-11 NOTE — Telephone Encounter (Signed)
1 month supply of Myrbetriq 25mg  placed at the front desk for the patient's daughter to pick up if she chooses to continue.

## 2022-06-11 NOTE — Telephone Encounter (Signed)
Pt's daughter is asking for a different medication instead of Myrbetriq, daughter states pt was taking Detrol LA. Myrbetriq is in the " hundreds" she also wanted to know if Myrbetriq made pt's dementia worse? Please advise

## 2022-06-16 NOTE — Progress Notes (Signed)
06/17/2022 11:49 PM   Kerri [name] 08/03/1931 [id number]  Referring provider: Donnamarie Rossetti, PA-C [address] [place],  [place] [address]  Urological history: 1. rUTI's -Contributing factors of age, vaginal atrophy, dementia, constipation, prediabetes,  -Documented positive urine cultures over the last year  Feb 17, 2022 Enterococcus faecalis  January 29, 2022 Enterococcus faecalis  October 15, 2021 multiple species  -Vaginal estrogen cream, cranberry tablets and trimethoprim 100 mg daily  2. OAB -Contributing factors of age, vaginal atrophy, dementia, prediabetes, anxiety, depression, CHF and HTN -PVR 85 mL    Chief Complaint  Patient presents with   Recurrent UTI    HPI: Kerri [name] is a 88 y.o. female who presents today for three month recheck w/ son and daughter-in-law Kerri [name].    History obtained through son and daughter-in-law.  They have been noticing a malodorous urine with issues with memory, very sleepy and being lethargic.  This has been going on for 1 week.  They state that she had some chills yesterday and today, but denied fevers, nausea and vomiting.  They did state that she saw blood in her pull-ups.  Patient denies any modifying or aggravating factors.  Patient denies any gross hematuria, dysuria or suprapubic/flank pain.  Patient denies any fevers, chills, nausea or vomiting.    They did use the vaginal estrogen cream, but it caused bleeding.  She is taking d-mannose, nitrofurantoin 50 mg daily and cranberry tablets.    CATH UA 11-30 RBCs and moderate bacteria  PVR 85 mL  She is also experiencing incontinence of both urine and stool.  PMH: Past Medical History:  Diagnosis Date   Arthritis     Surgical History: Past Surgical History:  Procedure Laterality Date   PACEMAKER LEADLESS INSERTION N/A 01/07/2021   Procedure: PACEMAKER LEADLESS INSERTION;  Surgeon: Isaias Cowman, MD;  Location: [facility];   Service: Cardiovascular;  Laterality: N/A;   PPM GENERATOR REMOVAL N/A 01/07/2021   Procedure: PPM GENERATOR REMOVAL;  Surgeon: Isaias Cowman, MD;  Location: [facility];  Service: Cardiovascular;  Laterality: N/A;    Home Medications:  Allergies as of 06/17/2022       Reactions   Alendronate Sodium Other (See Comments)   Bupropion Other (See Comments)   Tremors    Rofecoxib Diarrhea   Alendronate Rash        Medication List        Accurate as of June 17, 2022 11:49 PM. If you have any questions, ask your nurse or doctor.          acetaminophen 325 MG tablet Commonly known as: TYLENOL Take 2 tablets (650 mg total) by mouth every 6 (six) hours as needed for mild pain, moderate pain or fever.   amoxicillin-clavulanate 875-125 MG tablet Commonly known as: AUGMENTIN Take 1 tablet by mouth every 12 (twelve) hours.   CALCIUM-CARB 600 + D PO Take 1 tablet by mouth daily.   chlorhexidine 4 % external liquid Commonly known as: Hibiclens Apply topically daily as needed.   cyanocobalamin 1000 MCG/ML injection Commonly known as: VITAMIN B12 Inject 1,000 mcg into the muscle every 30 (thirty) days.   donepezil 10 MG tablet Commonly known as: ARICEPT Take 10 mg by mouth at bedtime.   estradiol 0.1 MG/GM vaginal cream Commonly known as: ESTRACE Estrogen Cream Instruction Discard applicator Apply pea sized amount to tip of finger to urethra before bed. Wash hands well after application. Use Monday, Wednesday and Friday  fluticasone 50 MCG/ACT nasal spray Commonly known as: FLONASE 2 sprays in both nostrils   folic acid 1 MG tablet Commonly known as: FOLVITE Take 1 mg by mouth daily.   levothyroxine 50 MCG tablet Commonly known as: SYNTHROID Take 50 mcg by mouth daily before breakfast.   memantine 10 MG tablet Commonly known as: NAMENDA Take 10 mg by mouth 2 (two) times daily.   metoprolol succinate 25 MG 24 hr tablet Commonly known as:  TOPROL-XL Take 1 tablet (25 mg total) by mouth daily.   mirabegron ER 25 MG Tb24 tablet Commonly known as: MYRBETRIQ Take 1 tablet (25 mg total) by mouth daily.   nitrofurantoin 50 MG capsule Commonly known as: MACRODANTIN TAKE 1 CAPSULE BY MOUTH ONCE DAILY   polyethylene glycol powder 17 GM/SCOOP powder Commonly known as: GLYCOLAX/MIRALAX Take by mouth.   venlafaxine XR 75 MG 24 hr capsule Commonly known as: EFFEXOR-XR Take 75 mg by mouth daily with breakfast.   Vitamin D (Ergocalciferol) 1.25 MG (50000 UNIT) Caps capsule Commonly known as: DRISDOL Take 50,000 Units by mouth every 7 (seven) days.        Allergies:  Allergies  Allergen Reactions   Alendronate Sodium Other (See Comments)   Bupropion Other (See Comments)    Tremors      Rofecoxib Diarrhea   Alendronate Rash    Family History: No family history on file.  Social History:  reports that she has never smoked. She has never used smokeless tobacco. She reports that she does not drink alcohol and does not use drugs.  ROS: Pertinent ROS in HPI  Physical Exam: BP 124/74   Pulse 60   Wt 160 lb (72.6 kg)   BMI 25.06 kg/m   Constitutional:  Well nourished. Alert and oriented, No acute distress. HEENT: [place] AT, moist mucus membranes.  Trachea midline Cardiovascular: No clubbing, cyanosis, or edema. Respiratory: Normal respiratory effort, no increased work of breathing. Neurologic: Grossly intact, no focal deficits, moving all 4 extremities. Psychiatric: Normal mood and affect.    Laboratory Data: Urinalysis Results for orders placed or performed in visit on 06/17/22  Microscopic Examination   Urine  Result Value Ref Range   WBC, UA 0-5 0 - 5 /hpf   RBC, Urine 11-30 (A) 0 - 2 /hpf   Epithelial Cells (non renal) 0-10 0 - 10 /hpf   Casts Present (A) None seen /lpf   Cast Type Granular casts (A) N/A   Bacteria, UA Moderate (A) None seen/Few  Urinalysis, Complete  Result Value Ref Range   Specific  Gravity, UA 1.015 1.005 - 1.030   pH, UA 5.0 5.0 - 7.5   Color, UA Yellow Yellow   Appearance Ur Clear Clear   Leukocytes,UA Negative Negative   Protein,UA Negative Negative/Trace   Glucose, UA Negative Negative   Ketones, UA Negative Negative   RBC, UA 1+ (A) Negative   Bilirubin, UA Negative Negative   Urobilinogen, Ur 0.2 0.2 - 1.0 mg/dL   Nitrite, UA Negative Negative   Microscopic Examination See below:      Hemoglobin A1C 4.2 - 5.6 % 6.1 High    Average Blood Glucose (Calc) mg/dL 128   Resulting Agency  [facility]  Narrative Performed by [facility] Normal Range:    4.2 - 5.6%  Increased Risk:  5.7 - 6.4%  Diabetes:        >= 6.5%  Glycemic Control for adults with diabetes:  <7%  Specimen Collected: 06/08/22 11:38   Performed by: [place]: 06/08/22 15:43  Received From: [facility]  Result Received: 06/11/22 10:03   Glucose 70 - 110 mg/dL 114 High    Sodium 136 - 145 mmol/L 143   Potassium 3.6 - 5.1 mmol/L 4.2   Chloride 97 - 109 mmol/L 106   Carbon Dioxide (CO2) 22.0 - 32.0 mmol/L 26.7   Urea Nitrogen (BUN) 7 - 25 mg/dL 19   Creatinine 0.6 - 1.1 mg/dL 1.2 High    Glomerular Filtration Rate (eGFR), MDRD Estimate >60 mL/min/1.73sq m 42 Low    Calcium 8.7 - 10.3 mg/dL 9.7   AST  8 - 39 U/L 18   ALT  5 - 38 U/L 18   Alk Phos (alkaline Phosphatase) 34 - 104 U/L 87   Albumin 3.5 - 4.8 g/dL 4.0   Bilirubin, Total 0.3 - 1.2 mg/dL 0.5   Protein, Total 6.1 - 7.9 g/dL 6.9   A/G Ratio 1.0 - 5.0 gm/dL 1.4   Resulting Agency  [facility]   Specimen Collected: 06/08/22 11:35   Performed by: [place]: 06/08/22 14:52  Received From: [facility]  Result Received: 06/11/22 10:03   WBC (White Blood Cell Count) 4.1 - 10.2 10^3/uL 7.8   RBC (Red Blood Cell Count) 4.04 - 5.48 10^6/uL 5.28   Hemoglobin 12.0 - 15.0 gm/dL 15.2 High     Hematocrit 35.0 - 47.0 % 47.2 High    MCV (Mean Corpuscular Volume) 80.0 - 100.0 fl 89.4   MCH (Mean Corpuscular Hemoglobin) 27.0 - 31.2 pg 28.8   MCHC (Mean Corpuscular Hemoglobin Concentration) 32.0 - 36.0 gm/dL 32.2   Platelet Count 150 - 450 10^3/uL 230   RDW-CV (Red Cell Distribution Width) 11.6 - 14.8 % 13.2   MPV (Mean Platelet Volume) 9.4 - 12.4 fl 11.1   Neutrophils 1.50 - 7.80 10^3/uL 4.65   Lymphocytes 1.00 - 3.60 10^3/uL 2.41   Monocytes 0.00 - 1.50 10^3/uL 0.49   Eosinophils 0.00 - 0.55 10^3/uL 0.22   Basophils 0.00 - 0.09 10^3/uL 0.06   Neutrophil % 32.0 - 70.0 % 59.3   Lymphocyte % 10.0 - 50.0 % 30.7   Monocyte % 4.0 - 13.0 % 6.3   Eosinophil % 1.0 - 5.0 % 2.8   Basophil% 0.0 - 2.0 % 0.8   Immature Granulocyte % <=0.7 % 0.1   Immature Granulocyte Count <=0.06 10^3/L 0.01   Resulting Agency  [facility]   Specimen Collected: 06/08/22 11:35   Performed by: [place]: 06/08/22 11:56  Received From: [facility]  Result Received: 06/11/22 10:03  I have reviewed the labs.   Pertinent Imaging: N/A  Assessment & Plan:    1. rUTI's -Cath UA with micro heme and moderate bacteria -Sent for culture -Augmentin 875/125 is started -Advised to use Hibiclens with a squeegee bottle as a mini bidet  2. Incontinence -Currently taking Myrbetriq 25 mg daily -We will evaluate this further at next visit  Return in about 1 month (around 07/17/2022) for CATH UA and symptom recheck.  These notes generated with voice recognition software. I apologize for typographical errors.  [place], [place] [address]  [place], [place] [address] [phone]

## 2022-06-17 ENCOUNTER — Ambulatory Visit: Payer: Medicare Other | Admitting: Urology

## 2022-06-17 ENCOUNTER — Encounter: Payer: Self-pay | Admitting: Urology

## 2022-06-17 VITALS — BP 124/74 | HR 60 | Wt 160.0 lb

## 2022-06-17 DIAGNOSIS — R5383 Other fatigue: Secondary | ICD-10-CM

## 2022-06-17 DIAGNOSIS — R82998 Other abnormal findings in urine: Secondary | ICD-10-CM

## 2022-06-17 DIAGNOSIS — N3281 Overactive bladder: Secondary | ICD-10-CM

## 2022-06-17 DIAGNOSIS — R32 Unspecified urinary incontinence: Secondary | ICD-10-CM | POA: Diagnosis not present

## 2022-06-17 DIAGNOSIS — R159 Full incontinence of feces: Secondary | ICD-10-CM | POA: Diagnosis not present

## 2022-06-17 DIAGNOSIS — R413 Other amnesia: Secondary | ICD-10-CM

## 2022-06-17 DIAGNOSIS — Z87440 Personal history of urinary (tract) infections: Secondary | ICD-10-CM

## 2022-06-17 DIAGNOSIS — N390 Urinary tract infection, site not specified: Secondary | ICD-10-CM

## 2022-06-17 LAB — URINALYSIS, COMPLETE
Bilirubin, UA: NEGATIVE
Glucose, UA: NEGATIVE
Ketones, UA: NEGATIVE
Leukocytes,UA: NEGATIVE
Nitrite, UA: NEGATIVE
Protein,UA: NEGATIVE
Specific Gravity, UA: 1.015 (ref 1.005–1.030)
Urobilinogen, Ur: 0.2 mg/dL (ref 0.2–1.0)
pH, UA: 5 (ref 5.0–7.5)

## 2022-06-17 LAB — MICROSCOPIC EXAMINATION

## 2022-06-17 MED ORDER — AMOXICILLIN-POT CLAVULANATE 875-125 MG PO TABS: 1.0000 | ORAL_TABLET | Freq: Two times a day (BID) | ORAL | 0 refills | Status: DC

## 2022-06-17 MED ORDER — CHLORHEXIDINE GLUCONATE 4 % EX LIQD: Freq: Every day | CUTANEOUS | 0 refills | Status: AC | PRN

## 2022-06-17 NOTE — Progress Notes (Signed)
In and Out Catheterization  Patient is present today for a I & O catheterization due to rUTI. Patient was cleaned and prepped in a sterile fashion with betadine . A 14FR cath was inserted no complications were noted , 28ml of urine return was noted, urine was golden yellow in color. A clean urine sample was collected for UA/UCX. Bladder was drained  And catheter was removed with out difficulty.    Performed by: Teressa Lower CMA and Franchot Erichsen CMA

## 2022-06-21 ENCOUNTER — Telehealth: Payer: Self-pay | Admitting: *Deleted

## 2022-06-21 NOTE — Telephone Encounter (Signed)
Patient called in and states she is having a lot of diarrhea . Advised to stop taking the amoxicillin-clavulanate . She would like to know if something else needs to be called in .

## 2022-06-21 NOTE — Telephone Encounter (Addendum)
Notified patient daughter in law as instructed, we call back if she keeps having diarrhea

## 2022-06-22 ENCOUNTER — Telehealth: Payer: Self-pay

## 2022-06-22 ENCOUNTER — Other Ambulatory Visit: Payer: Self-pay | Admitting: Urology

## 2022-06-22 LAB — CULTURE, URINE COMPREHENSIVE

## 2022-06-22 NOTE — Telephone Encounter (Signed)
-----   Message from Harle Battiest, PA-C sent at 06/22/2022  7:56 AM EDT ----- The final report is available for the urine culture, but I do not want to start another antibiotic at this time unless the diarrhea has stopped.

## 2022-06-22 NOTE — Telephone Encounter (Signed)
LMOM for daughter to return call. 

## 2022-06-23 MED ORDER — NITROFURANTOIN MONOHYD MACRO 100 MG PO CAPS: 100.0000 mg | ORAL_CAPSULE | Freq: Two times a day (BID) | ORAL | 0 refills | Status: AC

## 2022-06-23 NOTE — Telephone Encounter (Addendum)
Patient's daughter called back and states she has not had any diarrhea in the last 2 days. She is wanting another ABX sent to pharmacy. [facility] pharmacy.

## 2022-06-23 NOTE — Addendum Note (Signed)
Addended by: Lizbeth Bark on: 06/23/2022 02:22 PM   Modules accepted: Orders

## 2022-06-23 NOTE — Telephone Encounter (Signed)
Notified pt's daughter of Abx Rx and to take a daily probiotic. Also advised her pt should stop taking Macrodantiin 50 mg while completing this course of Abx. She expressed understanding.

## 2022-07-07 ENCOUNTER — Inpatient Hospital Stay
Admission: EM | Admit: 2022-07-07 | Discharge: 2022-07-21 | DRG: 178 | Disposition: A | Discharge status: Skilled Nursing Facility | Payer: Medicare Other | Attending: Internal Medicine | Admitting: Internal Medicine

## 2022-07-07 ENCOUNTER — Encounter: Payer: Self-pay | Admitting: Emergency Medicine

## 2022-07-07 ENCOUNTER — Other Ambulatory Visit: Payer: Self-pay

## 2022-07-07 ENCOUNTER — Emergency Department: Payer: Medicare Other

## 2022-07-07 DIAGNOSIS — I951 Orthostatic hypotension: Secondary | ICD-10-CM

## 2022-07-07 DIAGNOSIS — E440 Moderate protein-calorie malnutrition: Secondary | ICD-10-CM | POA: Insufficient documentation

## 2022-07-07 DIAGNOSIS — N390 Urinary tract infection, site not specified: Secondary | ICD-10-CM | POA: Diagnosis not present

## 2022-07-07 DIAGNOSIS — A0839 Other viral enteritis: Secondary | ICD-10-CM | POA: Diagnosis present

## 2022-07-07 DIAGNOSIS — R4182 Altered mental status, unspecified: Principal | ICD-10-CM

## 2022-07-07 DIAGNOSIS — M1990 Unspecified osteoarthritis, unspecified site: Secondary | ICD-10-CM | POA: Diagnosis present

## 2022-07-07 DIAGNOSIS — Z6826 Body mass index (BMI) 26.0-26.9, adult: Secondary | ICD-10-CM

## 2022-07-07 DIAGNOSIS — N1832 Chronic kidney disease, stage 3b: Secondary | ICD-10-CM | POA: Diagnosis present

## 2022-07-07 DIAGNOSIS — Z79890 Hormone replacement therapy (postmenopausal): Secondary | ICD-10-CM

## 2022-07-07 DIAGNOSIS — B961 Klebsiella pneumoniae [K. pneumoniae] as the cause of diseases classified elsewhere: Secondary | ICD-10-CM | POA: Diagnosis present

## 2022-07-07 DIAGNOSIS — R531 Weakness: Secondary | ICD-10-CM

## 2022-07-07 DIAGNOSIS — K5900 Constipation, unspecified: Secondary | ICD-10-CM | POA: Diagnosis not present

## 2022-07-07 DIAGNOSIS — I10 Essential (primary) hypertension: Secondary | ICD-10-CM | POA: Diagnosis present

## 2022-07-07 DIAGNOSIS — G912 (Idiopathic) normal pressure hydrocephalus: Secondary | ICD-10-CM | POA: Diagnosis present

## 2022-07-07 DIAGNOSIS — R410 Disorientation, unspecified: Secondary | ICD-10-CM

## 2022-07-07 DIAGNOSIS — Z888 Allergy status to other drugs, medicaments and biological substances status: Secondary | ICD-10-CM

## 2022-07-07 DIAGNOSIS — R42 Dizziness and giddiness: Secondary | ICD-10-CM

## 2022-07-07 DIAGNOSIS — U071 COVID-19: Secondary | ICD-10-CM | POA: Diagnosis not present

## 2022-07-07 DIAGNOSIS — I129 Hypertensive chronic kidney disease with stage 1 through stage 4 chronic kidney disease, or unspecified chronic kidney disease: Secondary | ICD-10-CM | POA: Diagnosis present

## 2022-07-07 DIAGNOSIS — E039 Hypothyroidism, unspecified: Secondary | ICD-10-CM | POA: Diagnosis present

## 2022-07-07 DIAGNOSIS — E878 Other disorders of electrolyte and fluid balance, not elsewhere classified: Secondary | ICD-10-CM | POA: Diagnosis present

## 2022-07-07 DIAGNOSIS — F0390 Unspecified dementia without behavioral disturbance: Secondary | ICD-10-CM | POA: Diagnosis present

## 2022-07-07 DIAGNOSIS — E876 Hypokalemia: Secondary | ICD-10-CM | POA: Diagnosis present

## 2022-07-07 DIAGNOSIS — R197 Diarrhea, unspecified: Secondary | ICD-10-CM

## 2022-07-07 LAB — CBC
HCT: 46.5 % — ABNORMAL HIGH (ref 36.0–46.0)
Hemoglobin: 14.8 g/dL (ref 12.0–15.0)
MCH: 28.4 pg (ref 26.0–34.0)
MCHC: 31.8 g/dL (ref 30.0–36.0)
MCV: 89.1 fL (ref 80.0–100.0)
Platelets: 241 10*3/uL (ref 150–400)
RBC: 5.22 MIL/uL — ABNORMAL HIGH (ref 3.87–5.11)
RDW: 13.2 % (ref 11.5–15.5)
WBC: 7.7 10*3/uL (ref 4.0–10.5)
nRBC: 0 % (ref 0.0–0.2)

## 2022-07-07 LAB — URINALYSIS, ROUTINE W REFLEX MICROSCOPIC
Bilirubin Urine: NEGATIVE
Glucose, UA: NEGATIVE mg/dL
Hgb urine dipstick: NEGATIVE
Ketones, ur: NEGATIVE mg/dL
Nitrite: POSITIVE — AB
Protein, ur: NEGATIVE mg/dL
Specific Gravity, Urine: 1.017 (ref 1.005–1.030)
Squamous Epithelial / HPF: NONE SEEN (ref 0–5)
pH: 5 (ref 5.0–8.0)

## 2022-07-07 LAB — COMPREHENSIVE METABOLIC PANEL
ALT: 27 U/L (ref 0–44)
AST: 33 U/L (ref 15–41)
Albumin: 3.7 g/dL (ref 3.5–5.0)
Alkaline Phosphatase: 74 U/L (ref 38–126)
Anion gap: 10 (ref 5–15)
BUN: 19 mg/dL (ref 8–23)
CO2: 24 mmol/L (ref 22–32)
Calcium: 8.9 mg/dL (ref 8.9–10.3)
Chloride: 108 mmol/L (ref 98–111)
Creatinine, Ser: 1.25 mg/dL — ABNORMAL HIGH (ref 0.44–1.00)
GFR, Estimated: 41 mL/min — ABNORMAL LOW (ref >60)
Glucose, Bld: 137 mg/dL — ABNORMAL HIGH (ref 70–99)
Potassium: 3.4 mmol/L — ABNORMAL LOW (ref 3.5–5.1)
Sodium: 142 mmol/L (ref 135–145)
Total Bilirubin: 0.8 mg/dL (ref 0.3–1.2)
Total Protein: 7.5 g/dL (ref 6.5–8.1)

## 2022-07-07 LAB — TROPONIN I (HIGH SENSITIVITY)
Troponin I (High Sensitivity): 13 ng/L (ref <18)
Troponin I (High Sensitivity): 14 ng/L (ref <18)

## 2022-07-07 LAB — RESP PANEL BY RT-PCR (FLU A&B, COVID) ARPGX2
Influenza A by PCR: NEGATIVE
Influenza B by PCR: NEGATIVE
SARS Coronavirus 2 by RT PCR: POSITIVE — AB

## 2022-07-07 LAB — SARS CORONAVIRUS 2 BY RT PCR: SARS Coronavirus 2 by RT PCR: POSITIVE — AB

## 2022-07-07 MED ORDER — ALBUTEROL SULFATE HFA 108 (90 BASE) MCG/ACT IN AERS
2.0000 | INHALATION_SPRAY | Freq: Four times a day (QID) | RESPIRATORY_TRACT | Status: DC
  Administered 2022-07-07 – 2022-07-21 (×48): 2 via RESPIRATORY_TRACT
  Filled 2022-07-07: qty 6.7

## 2022-07-07 MED ORDER — HYDROCODONE-ACETAMINOPHEN 5-325 MG PO TABS: 1.0000 | ORAL_TABLET | ORAL | Status: DC | PRN

## 2022-07-07 MED ORDER — SODIUM CHLORIDE 0.9 % IV SOLN: INTRAVENOUS | Status: AC

## 2022-07-07 MED ORDER — METOPROLOL SUCCINATE ER 25 MG PO TB24
25.0000 mg | ORAL_TABLET | Freq: Every day | ORAL | Status: DC
  Administered 2022-07-08 – 2022-07-10 (×3): 25 mg via ORAL
  Filled 2022-07-07 (×3): qty 1

## 2022-07-07 MED ORDER — NIRMATRELVIR/RITONAVIR (PAXLOVID) TABLET (RENAL DOSING)
2.0000 | ORAL_TABLET | Freq: Two times a day (BID) | ORAL | Status: AC
  Administered 2022-07-07 – 2022-07-12 (×10): 2 via ORAL
  Filled 2022-07-07: qty 20

## 2022-07-07 MED ORDER — MORPHINE SULFATE (PF) 2 MG/ML IV SOLN: 2.0000 mg | INTRAVENOUS | Status: DC | PRN

## 2022-07-07 MED ORDER — HEPARIN SODIUM (PORCINE) 5000 UNIT/ML IJ SOLN
5000.0000 [IU] | Freq: Three times a day (TID) | INTRAMUSCULAR | Status: DC
  Administered 2022-07-07 – 2022-07-21 (×41): 5000 [IU] via SUBCUTANEOUS
  Filled 2022-07-07 (×41): qty 1

## 2022-07-07 MED ORDER — SODIUM CHLORIDE 0.9 % IV SOLN
1.0000 g | Freq: Once | INTRAVENOUS | Status: AC
  Administered 2022-07-07: 1 g via INTRAVENOUS
  Filled 2022-07-07: qty 10

## 2022-07-07 MED ORDER — ACETAMINOPHEN 325 MG PO TABS
650.0000 mg | ORAL_TABLET | Freq: Four times a day (QID) | ORAL | Status: DC | PRN
  Administered 2022-07-07 – 2022-07-13 (×3): 650 mg via ORAL
  Filled 2022-07-07 (×4): qty 2

## 2022-07-07 MED ORDER — DONEPEZIL HCL 5 MG PO TABS
10.0000 mg | ORAL_TABLET | Freq: Every day | ORAL | Status: DC
  Administered 2022-07-07 – 2022-07-20 (×14): 10 mg via ORAL
  Filled 2022-07-07 (×14): qty 2

## 2022-07-07 MED ORDER — HYDROCOD POLI-CHLORPHE POLI ER 10-8 MG/5ML PO SUER
5.0000 mL | Freq: Two times a day (BID) | ORAL | Status: DC | PRN
  Administered 2022-07-13: 5 mL via ORAL
  Filled 2022-07-07: qty 5

## 2022-07-07 MED ORDER — MIRABEGRON ER 25 MG PO TB24
25.0000 mg | ORAL_TABLET | Freq: Every day | ORAL | Status: DC
  Administered 2022-07-08 – 2022-07-21 (×14): 25 mg via ORAL
  Filled 2022-07-07 (×14): qty 1

## 2022-07-07 MED ORDER — LEVOTHYROXINE SODIUM 50 MCG PO TABS
50.0000 ug | ORAL_TABLET | Freq: Every day | ORAL | Status: DC
  Administered 2022-07-08 – 2022-07-21 (×14): 50 ug via ORAL
  Filled 2022-07-07 (×14): qty 1

## 2022-07-07 MED ORDER — VENLAFAXINE HCL ER 75 MG PO CP24
75.0000 mg | ORAL_CAPSULE | Freq: Every day | ORAL | Status: DC
  Administered 2022-07-08 – 2022-07-21 (×14): 75 mg via ORAL
  Filled 2022-07-07 (×14): qty 1

## 2022-07-07 MED ORDER — SODIUM CHLORIDE 0.9% FLUSH
3.0000 mL | Freq: Two times a day (BID) | INTRAVENOUS | Status: DC
  Administered 2022-07-07 – 2022-07-16 (×19): 3 mL via INTRAVENOUS

## 2022-07-07 MED ORDER — GUAIFENESIN-DM 100-10 MG/5ML PO SYRP
10.0000 mL | ORAL_SOLUTION | ORAL | Status: DC | PRN
  Administered 2022-07-07: 10 mL via ORAL
  Filled 2022-07-07: qty 10

## 2022-07-07 MED ORDER — FLUTICASONE PROPIONATE 50 MCG/ACT NA SUSP: 1.0000 | Freq: Every day | NASAL | Status: DC | PRN

## 2022-07-07 MED ORDER — MEMANTINE HCL 5 MG PO TABS
10.0000 mg | ORAL_TABLET | Freq: Two times a day (BID) | ORAL | Status: DC
  Administered 2022-07-07 – 2022-07-21 (×28): 10 mg via ORAL
  Filled 2022-07-07 (×28): qty 2

## 2022-07-07 NOTE — ED Triage Notes (Signed)
Patient to ED via POV with granddaughter for possible UTI. Patient has been more confused than normal and not walking around like normal. Granddaughter states she has a "puffy" breathing- respirations even and unlabored at this time.

## 2022-07-07 NOTE — ED Provider Triage Note (Signed)
Emergency Medicine Provider Triage Evaluation Note  Kerri [name] , a 88 y.o. female  was evaluated in triage.  Pt complains of altered mental status.  Granddaughter states that she has had some difficulty breathing.  Some cough and congestion.  Is concerned she has UTI.  Review of Systems  Positive: To mental status Negative:   Physical Exam  BP 112/63 (BP Location: Right Arm)   Pulse 63   Temp 98.8 F (37.1 C) (Oral)   Resp 16   Ht 5\' 9"  (1.753 m)   Wt 81.6 kg   SpO2 98%   BMI 26.58 kg/m  Gen:   Awake, no distress   Resp:  Normal effort  MSK:   Moves extremities without difficulty  Other:    Medical Decision Making  Medically screening exam initiated at 1:08 PM.  Appropriate orders placed.  Kerri [name] was informed that the remainder of the evaluation will be completed by another provider, this initial triage assessment does not replace that evaluation, and the importance of remaining in the ED until their evaluation is complete.     Versie Starks, PA-C 07/07/22 1309

## 2022-07-07 NOTE — ED Provider Notes (Signed)
[facility] Provider Note    Event Date/Time   First MD Initiated Contact with Patient 07/07/22 1547     (approximate)   History   Altered Mental Status   HPI  Kerri [name] is a 88 y.o. female with a past medical history of dementia.  Adopted grand daughter reports patient gets more confused and unable really to walk almost when she gets a UTI.  She is that way now and the grand daughter think she has a UTI.  Symptoms started this morning.  Patient has a mild cough no fever not really short of breath at all      Physical Exam   Triage Vital Signs: ED Triage Vitals  Enc Vitals Group     BP 07/07/22 1303 112/63     Pulse Rate 07/07/22 1303 63     Resp 07/07/22 1303 16     Temp 07/07/22 1303 98.8 F (37.1 C)     Temp Source 07/07/22 1303 Oral     SpO2 07/07/22 1303 98 %     Weight 07/07/22 1303 180 lb (81.6 kg)     Height 07/07/22 1303 5\' 9"  (1.753 m)     Head Circumference --      Peak Flow --      Pain Score 07/07/22 1308 5     Pain Loc --      Pain Edu? --      Excl. in GC? --     Most recent vital signs: Vitals:   07/07/22 2059 07/07/22 2100  BP:  131/81  Pulse:  61  Resp:  (!) 25  Temp: 98.6 F (37 C)   SpO2:  94%     General: Awake, alert, no distress.  CV:  Good peripheral perfusion.  Regular rate and rhythm no audible murmurs Resp:  Normal effort.  Lungs are clear Abd:  No distention.  Soft and nontender Extremities: No edema  ED Results / Procedures / Treatments   Labs (all labs ordered are listed, but only abnormal results are displayed) Labs Reviewed  RESP PANEL BY RT-PCR (FLU A&B, COVID) ARPGX2 - Abnormal; Notable for the following components:      Result Value   SARS Coronavirus 2 by RT PCR POSITIVE (*)    All other components within normal limits  SARS CORONAVIRUS 2 BY RT PCR - Abnormal; Notable for the following components:   SARS Coronavirus 2 by RT PCR POSITIVE (*)    All other components within normal  limits  COMPREHENSIVE METABOLIC PANEL - Abnormal; Notable for the following components:   Potassium 3.4 (*)    Glucose, Bld 137 (*)    Creatinine, Ser 1.25 (*)    GFR, Estimated 41 (*)    All other components within normal limits  CBC - Abnormal; Notable for the following components:   RBC 5.22 (*)    HCT 46.5 (*)    All other components within normal limits  URINALYSIS, ROUTINE W REFLEX MICROSCOPIC - Abnormal; Notable for the following components:   Color, Urine YELLOW (*)    APPearance HAZY (*)    Nitrite POSITIVE (*)    Leukocytes,Ua SMALL (*)    Bacteria, UA RARE (*)    All other components within normal limits  COMPREHENSIVE METABOLIC PANEL  CBC  CBG MONITORING, ED  TROPONIN I (HIGH SENSITIVITY)  TROPONIN I (HIGH SENSITIVITY)     EKG     RADIOLOGY Chest x-ray read by radiology reviewed and interpreted by  me is negative   PROCEDURES:  Critical Care performed:   Procedures   MEDICATIONS ORDERED IN ED: Medications  mirabegron ER (MYRBETRIQ) tablet 25 mg (has no administration in time range)  metoprolol succinate (TOPROL-XL) 24 hr tablet 25 mg (has no administration in time range)  memantine (NAMENDA) tablet 10 mg (has no administration in time range)  levothyroxine (SYNTHROID) tablet 50 mcg (has no administration in time range)  fluticasone (FLONASE) 50 MCG/ACT nasal spray 1 spray (has no administration in time range)  venlafaxine XR (EFFEXOR-XR) 24 hr capsule 75 mg (has no administration in time range)  donepezil (ARICEPT) tablet 10 mg (has no administration in time range)  acetaminophen (TYLENOL) tablet 650 mg (has no administration in time range)  heparin injection 5,000 Units (has no administration in time range)  sodium chloride flush (NS) 0.9 % injection 3 mL (has no administration in time range)  0.9 %  sodium chloride infusion (has no administration in time range)  HYDROcodone-acetaminophen (NORCO/VICODIN) 5-325 MG per tablet 1 tablet (has no  administration in time range)  morphine (PF) 2 MG/ML injection 2 mg (has no administration in time range)  nirmatrelvir/ritonavir EUA (renal dosing) (PAXLOVID) 2 tablet (has no administration in time range)  albuterol (VENTOLIN HFA) 108 (90 Base) MCG/ACT inhaler 2 puff (has no administration in time range)  guaiFENesin-dextromethorphan (ROBITUSSIN DM) 100-10 MG/5ML syrup 10 mL (has no administration in time range)  chlorpheniramine-HYDROcodone (TUSSIONEX) 10-8 MG/5ML suspension 5 mL (has no administration in time range)  cefTRIAXone (ROCEPHIN) 1 g in sodium chloride 0.9 % 100 mL IVPB (0 g Intravenous Stopped 07/07/22 2030)     IMPRESSION / MDM / ASSESSMENT AND PLAN / ED COURSE  I reviewed the triage vital signs and the nursing notes. Discussed patient with granddaughter and daughter-in-law.  They both report that the patient will not be able to get any home care now that she is COVID-positive and it was difficult to take care of her with her dementia being worsened by the apparent UTI in the first place.  They do not think they can manage her at home currently although once the UTI and COVID are better probably they will be able to manage her.  Patient is not hypoxic.  She has been doing fairly well here with the granddaughter in the room.  Differential diagnosis includes, but is not limited to, dementia worsened by COVID, dementia worsened by UTI, dementia worsening due to the progression of time, effects of COVID.  Patient's presentation is most consistent with acute complicated illness / injury requiring diagnostic workup.  The patient is on the cardiac monitor to evaluate for evidence of arrhythmia and/or significant heart rate changes.  None have been seen   FINAL CLINICAL IMPRESSION(S) / ED DIAGNOSES   Final diagnoses:  Altered mental status, unspecified altered mental status type  Lower urinary tract infectious disease  COVID     Rx / DC Orders   ED Discharge Orders     None         Note:  This document was prepared using Dragon voice recognition software and may include unintentional dictation errors.   Nena Polio, MD 07/07/22 2236

## 2022-07-08 DIAGNOSIS — Z888 Allergy status to other drugs, medicaments and biological substances status: Secondary | ICD-10-CM | POA: Diagnosis not present

## 2022-07-08 DIAGNOSIS — N3000 Acute cystitis without hematuria: Secondary | ICD-10-CM | POA: Diagnosis not present

## 2022-07-08 DIAGNOSIS — E878 Other disorders of electrolyte and fluid balance, not elsewhere classified: Secondary | ICD-10-CM | POA: Diagnosis present

## 2022-07-08 DIAGNOSIS — E876 Hypokalemia: Secondary | ICD-10-CM | POA: Diagnosis present

## 2022-07-08 DIAGNOSIS — F0390 Unspecified dementia without behavioral disturbance: Secondary | ICD-10-CM

## 2022-07-08 DIAGNOSIS — U071 COVID-19: Secondary | ICD-10-CM

## 2022-07-08 DIAGNOSIS — I951 Orthostatic hypotension: Secondary | ICD-10-CM | POA: Diagnosis not present

## 2022-07-08 DIAGNOSIS — E440 Moderate protein-calorie malnutrition: Secondary | ICD-10-CM

## 2022-07-08 DIAGNOSIS — E039 Hypothyroidism, unspecified: Secondary | ICD-10-CM | POA: Diagnosis present

## 2022-07-08 DIAGNOSIS — K5900 Constipation, unspecified: Secondary | ICD-10-CM | POA: Diagnosis not present

## 2022-07-08 DIAGNOSIS — R42 Dizziness and giddiness: Secondary | ICD-10-CM | POA: Diagnosis not present

## 2022-07-08 DIAGNOSIS — A0839 Other viral enteritis: Secondary | ICD-10-CM | POA: Diagnosis present

## 2022-07-08 DIAGNOSIS — Z6826 Body mass index (BMI) 26.0-26.9, adult: Secondary | ICD-10-CM | POA: Diagnosis not present

## 2022-07-08 DIAGNOSIS — Z79890 Hormone replacement therapy (postmenopausal): Secondary | ICD-10-CM | POA: Diagnosis not present

## 2022-07-08 DIAGNOSIS — N1832 Chronic kidney disease, stage 3b: Secondary | ICD-10-CM

## 2022-07-08 DIAGNOSIS — R410 Disorientation, unspecified: Secondary | ICD-10-CM | POA: Diagnosis present

## 2022-07-08 DIAGNOSIS — R531 Weakness: Secondary | ICD-10-CM

## 2022-07-08 DIAGNOSIS — N390 Urinary tract infection, site not specified: Secondary | ICD-10-CM | POA: Diagnosis present

## 2022-07-08 DIAGNOSIS — I129 Hypertensive chronic kidney disease with stage 1 through stage 4 chronic kidney disease, or unspecified chronic kidney disease: Secondary | ICD-10-CM | POA: Diagnosis present

## 2022-07-08 DIAGNOSIS — B961 Klebsiella pneumoniae [K. pneumoniae] as the cause of diseases classified elsewhere: Secondary | ICD-10-CM | POA: Diagnosis present

## 2022-07-08 DIAGNOSIS — M1990 Unspecified osteoarthritis, unspecified site: Secondary | ICD-10-CM | POA: Diagnosis present

## 2022-07-08 DIAGNOSIS — I10 Essential (primary) hypertension: Secondary | ICD-10-CM

## 2022-07-08 LAB — COMPREHENSIVE METABOLIC PANEL
ALT: 29 U/L (ref 0–44)
AST: 37 U/L (ref 15–41)
Albumin: 3.4 g/dL — ABNORMAL LOW (ref 3.5–5.0)
Alkaline Phosphatase: 68 U/L (ref 38–126)
Anion gap: 9 (ref 5–15)
BUN: 19 mg/dL (ref 8–23)
CO2: 25 mmol/L (ref 22–32)
Calcium: 8.5 mg/dL — ABNORMAL LOW (ref 8.9–10.3)
Chloride: 110 mmol/L (ref 98–111)
Creatinine, Ser: 1.4 mg/dL — ABNORMAL HIGH (ref 0.44–1.00)
GFR, Estimated: 36 mL/min — ABNORMAL LOW (ref >60)
Glucose, Bld: 96 mg/dL (ref 70–99)
Potassium: 3.8 mmol/L (ref 3.5–5.1)
Sodium: 144 mmol/L (ref 135–145)
Total Bilirubin: 0.6 mg/dL (ref 0.3–1.2)
Total Protein: 7.3 g/dL (ref 6.5–8.1)

## 2022-07-08 LAB — CBC
HCT: 46.2 % — ABNORMAL HIGH (ref 36.0–46.0)
Hemoglobin: 14.6 g/dL (ref 12.0–15.0)
MCH: 27.8 pg (ref 26.0–34.0)
MCHC: 31.6 g/dL (ref 30.0–36.0)
MCV: 87.8 fL (ref 80.0–100.0)
Platelets: 211 10*3/uL (ref 150–400)
RBC: 5.26 MIL/uL — ABNORMAL HIGH (ref 3.87–5.11)
RDW: 13.5 % (ref 11.5–15.5)
WBC: 6.6 10*3/uL (ref 4.0–10.5)
nRBC: 0 % (ref 0.0–0.2)

## 2022-07-08 MED ORDER — SODIUM CHLORIDE 0.9 % IV SOLN
1.0000 g | INTRAVENOUS | Status: DC
  Administered 2022-07-08 – 2022-07-10 (×3): 1 g via INTRAVENOUS
  Filled 2022-07-08 (×3): qty 10

## 2022-07-08 NOTE — Progress Notes (Signed)
Initial Nutrition Assessment  DOCUMENTATION CODES:   Non-severe (moderate) malnutrition in context of chronic illness  INTERVENTION:   -Ensure Enlive po TID, each supplement provides 350 kcal and 20 grams of protein -MVI with minerals daily -Liberalize diet for wider variety of meal selections  NUTRITION DIAGNOSIS:   Moderate Malnutrition related to chronic illness (dementia) as evidenced by mild fat depletion, moderate fat depletion, mild muscle depletion, moderate muscle depletion.  GOAL:   Patient will meet greater than or equal to 90% of their needs  MONITOR:   PO intake, Supplement acceptance  REASON FOR ASSESSMENT:   Malnutrition Screening Tool    ASSESSMENT:   Pt brought by family for confusion.  Patient per report gets confused when she has UTIs and granddaughter states that she thinks she has a UTI because patient has been confused no reports of fever, patient does have a mild cough.  Pt admitted with AMS and COVID-19.   Reviewed I/O's: +168 ml x 24 hours   Case discussed with nurse tech, who reports pt is very "out of it".   Pt awake, but needed a lot of redirection. Pt reports she feels sleepy. She shares that she has had a poor appetite "for a long time", but unsure how long. Noted pt consumed a few bites of breakfast this morning.   Reviewed wt hx; pt has experienced a 13% wt loss over the past 9 months. While this is not significant for time frame, it is concerning given pt's advanced age.  Discussed importance of good meal and supplement intake to promote healing. Pt amenable to Ensure.   Medications reviewed and include 0.9% sodium chloride infusion @ 20 ml/hr.   Labs reviewed: CBGS: 132 (inpatient orders for glycemic control are none).    NUTRITION - FOCUSED PHYSICAL EXAM:  Flowsheet Row Most Recent Value  Orbital Region Moderate depletion  Upper Arm Region Moderate depletion  Thoracic and Lumbar Region Mild depletion  Buccal Region Moderate  depletion  Temple Region Moderate depletion  Clavicle Bone Region Moderate depletion  Clavicle and Acromion Bone Region Moderate depletion  Scapular Bone Region Moderate depletion  Dorsal Hand Moderate depletion  Patellar Region Moderate depletion  Anterior Thigh Region Moderate depletion  Posterior Calf Region Moderate depletion  Edema (RD Assessment) None  Hair Reviewed  Eyes Reviewed  Mouth Reviewed  Skin Reviewed  Nails Reviewed       Diet Order:   Diet Order             Diet Heart Room service appropriate? Yes; Fluid consistency: Thin  Diet effective now                   EDUCATION NEEDS:   Education needs have been addressed  Skin:  Skin Assessment: Reviewed RN Assessment  Last BM:  Unknown  Height:   Ht Readings from Last 1 Encounters:  07/07/22 5\' 8"  (1.727 m)    Weight:   Wt Readings from Last 1 Encounters:  07/08/22 73 kg    Ideal Body Weight:  63.6 kg  BMI:  Body mass index is 24.47 kg/m.  Estimated Nutritional Needs:   Kcal:  1800-2000  Protein:  105-120 grams  Fluid:  > 1.8 L    Loistine Chance, RD, LDN, [place] Registered Dietitian II Certified Diabetes Care and Education Specialist Please refer to [facility] for RD and/or RD on-call/weekend/after hours pager

## 2022-07-08 NOTE — Assessment & Plan Note (Addendum)
Completed 5-day course of Paxlovid.  Completed isolation course here in the hospital.

## 2022-07-08 NOTE — Assessment & Plan Note (Signed)
Continue supplements

## 2022-07-08 NOTE — Assessment & Plan Note (Addendum)
Discontinued beta-blocker on 07/11/2022 with near syncopal episode with working with physical therapy.

## 2022-07-08 NOTE — H&P (Signed)
History and Physical    Chief Complaint: AMS   HISTORY OF PRESENT ILLNESS: Kerri [name] is an 88 y.o. female  brought by family for confusion.  Patient per report gets confused when she has UTIs and granddaughter states that she thinks she has a UTI because patient has been confused no reports of fever, patient does have a mild cough.  Reports symptoms started today morning. Course of any abdominal pain nausea vomiting. Per daughter at bedside pt has had cough from today. No gi symptoms.    Pt has PMH as below: Past Medical History:  Diagnosis Date   Arthritis     Review Of Systems: Review of Systems  Unable to perform ROS: Age  Respiratory:  Positive for cough.      ALLERGIES: Allergies  Allergen Reactions   Alendronate Sodium Other (See Comments)   Bupropion Other (See Comments)    Tremors      Rofecoxib Diarrhea   Alendronate Rash    PAST SURGICAL HISTORY: Past Surgical History:  Procedure Laterality Date   PACEMAKER LEADLESS INSERTION N/A 01/07/2021   Procedure: PACEMAKER LEADLESS INSERTION;  Surgeon: Isaias Cowman, MD;  Location: [facility];  Service: Cardiovascular;  Laterality: N/A;   PPM GENERATOR REMOVAL N/A 01/07/2021   Procedure: PPM GENERATOR REMOVAL;  Surgeon: Isaias Cowman, MD;  Location: [facility];  Service: Cardiovascular;  Laterality: N/A;     SOCIAL HISTORY: Social History   Socioeconomic History   Marital status: Widowed    Spouse name: Not on file   Number of children: Not on file   Years of education: Not on file   Highest education level: Not on file  Occupational History   Not on file  Tobacco Use   Smoking status: Never   Smokeless tobacco: Never  Substance and Sexual Activity   Alcohol use: Never   Drug use: Never   Sexual activity: Not on file  Other Topics Concern   Not on file  Social History Narrative   Not on file   Social Determinants of Health   Financial Resource Strain: Low  Risk  (05/01/2019)   Overall Financial Resource Strain (CARDIA)    Difficulty of Paying Living Expenses: Not hard at all  Food Insecurity: No Food Insecurity (05/01/2019)   Hunger Vital Sign    Worried About Running Out of Food in the Last Year: Never true    [place] in the Last Year: Never true  Transportation Needs: No Transportation Needs (05/01/2019)   PRAPARE - Hydrologist (Medical): No    Lack of Transportation (Non-Medical): No  Physical Activity: Insufficiently Active (05/01/2019)   Exercise Vital Sign    Days of Exercise per Week: 7 days    Minutes of Exercise per Session: 20 min  Stress: No Stress Concern Present (05/01/2019)   [place]    Feeling of Stress : Not at all  Social Connections: Socially Isolated (05/01/2019)   Social Connection and Isolation Panel [NHANES]    Frequency of Communication with Friends and Family: More than three times a week    Frequency of Social Gatherings with Friends and Family: More than three times a week    Attends Religious Services: Never    Marine scientist or Organizations: No    Attends Archivist Meetings: Never    Marital Status: Widowed      CURRENT MEDS:  Current  Facility-Administered Medications (Endocrine & Metabolic):    levothyroxine (SYNTHROID) tablet 50 mcg   Current Facility-Administered Medications (Cardiovascular):    metoprolol succinate (TOPROL-XL) 24 hr tablet 25 mg   Current Facility-Administered Medications (Respiratory):    albuterol (VENTOLIN HFA) 108 (90 Base) MCG/ACT inhaler 2 puff   chlorpheniramine-HYDROcodone (TUSSIONEX) 10-8 MG/5ML suspension 5 mL   fluticasone (FLONASE) 50 MCG/ACT nasal spray 1 spray   guaiFENesin-dextromethorphan (ROBITUSSIN DM) 100-10 MG/5ML syrup 10 mL   Current Facility-Administered Medications (Analgesics):    acetaminophen (TYLENOL) tablet 650 mg    HYDROcodone-acetaminophen (NORCO/VICODIN) 5-325 MG per tablet 1 tablet   morphine (PF) 2 MG/ML injection 2 mg   Current Facility-Administered Medications (Hematological):    heparin injection 5,000 Units   Current Facility-Administered Medications (Other):    0.9 %  sodium chloride infusion   donepezil (ARICEPT) tablet 10 mg   memantine (NAMENDA) tablet 10 mg   mirabegron ER (MYRBETRIQ) tablet 25 mg   nirmatrelvir/ritonavir EUA (renal dosing) (PAXLOVID) 2 tablet   sodium chloride flush (NS) 0.9 % injection 3 mL   venlafaxine XR (EFFEXOR-XR) 24 hr capsule 75 mg  No current outpatient medications on file.    ED Course: Pt in Ed is alert and awake.  Vitals:   07/07/22 2000 07/07/22 2059 07/07/22 2100 07/07/22 2236  BP: 108/87  131/81 (!) 119/97  Pulse: 66  61 87  Resp: (!) 22  (!) 25 18  Temp:  98.6 F (37 C)  (!) 100.9 F (38.3 C)  TempSrc:  Oral  Oral  SpO2: 96%  94% 93%  Weight:    73 kg  Height:    5\' 8"  (1.727 m)   Total I/O In: 100 [IV Piggyback:100] Out: -  SpO2: 93 % Blood work in ed shows  Results for orders placed or performed during the hospital encounter of 07/07/22 (from the past 24 hour(s))  Resp Panel by RT-PCR (Flu A&B, Covid) Anterior Nasal Swab     Status: Abnormal   Collection Time: 07/07/22  1:19 PM   Specimen: Anterior Nasal Swab  Result Value Ref Range   SARS Coronavirus 2 by RT PCR POSITIVE (A) NEGATIVE   Influenza A by PCR NEGATIVE NEGATIVE   Influenza B by PCR NEGATIVE NEGATIVE  Comprehensive metabolic panel     Status: Abnormal   Collection Time: 07/07/22  1:19 PM  Result Value Ref Range   Sodium 142 135 - 145 mmol/L   Potassium 3.4 (L) 3.5 - 5.1 mmol/L   Chloride 108 98 - 111 mmol/L   CO2 24 22 - 32 mmol/L   Glucose, Bld 137 (H) 70 - 99 mg/dL   BUN 19 8 - 23 mg/dL   Creatinine, Ser 1.25 (H) 0.44 - 1.00 mg/dL   Calcium 8.9 8.9 - 10.3 mg/dL   Total Protein 7.5 6.5 - 8.1 g/dL   Albumin 3.7 3.5 - 5.0 g/dL   AST 33 15 - 41 U/L   ALT 27  0 - 44 U/L   Alkaline Phosphatase 74 38 - 126 U/L   Total Bilirubin 0.8 0.3 - 1.2 mg/dL   GFR, Estimated 41 (L) >60 mL/min   Anion gap 10 5 - 15  CBC     Status: Abnormal   Collection Time: 07/07/22  1:19 PM  Result Value Ref Range   WBC 7.7 4.0 - 10.5 K/uL   RBC 5.22 (H) 3.87 - 5.11 MIL/uL   Hemoglobin 14.8 12.0 - 15.0 g/dL   HCT 46.5 (H) 36.0 - 46.0 %  MCV 89.1 80.0 - 100.0 fL   MCH 28.4 26.0 - 34.0 pg   MCHC 31.8 30.0 - 36.0 g/dL   RDW 13.2 11.5 - 15.5 %   Platelets 241 150 - 400 K/uL   nRBC 0.0 0.0 - 0.2 %  Troponin I (High Sensitivity)     Status: None   Collection Time: 07/07/22  1:19 PM  Result Value Ref Range   Troponin I (High Sensitivity) 13 <18 ng/L  Urinalysis, Routine w reflex microscopic Urine, In & Out Cath     Status: Abnormal   Collection Time: 07/07/22  5:05 PM  Result Value Ref Range   Color, Urine YELLOW (A) YELLOW   APPearance HAZY (A) CLEAR   Specific Gravity, Urine 1.017 1.005 - 1.030   pH 5.0 5.0 - 8.0   Glucose, UA NEGATIVE NEGATIVE mg/dL   Hgb urine dipstick NEGATIVE NEGATIVE   Bilirubin Urine NEGATIVE NEGATIVE   Ketones, ur NEGATIVE NEGATIVE mg/dL   Protein, ur NEGATIVE NEGATIVE mg/dL   Nitrite POSITIVE (A) NEGATIVE   Leukocytes,Ua SMALL (A) NEGATIVE   RBC / HPF 0-5 0 - 5 RBC/hpf   WBC, UA 21-50 0 - 5 WBC/hpf   Bacteria, UA RARE (A) NONE SEEN   Squamous Epithelial / LPF NONE SEEN 0 - 5   Mucus PRESENT    Hyaline Casts, UA PRESENT   Troponin I (High Sensitivity)     Status: None   Collection Time: 07/07/22  5:05 PM  Result Value Ref Range   Troponin I (High Sensitivity) 14 <18 ng/L  SARS Coronavirus 2 by RT PCR (hospital order, performed in [facility] hospital lab) *cepheid single result test* Anterior Nasal Swab     Status: Abnormal   Collection Time: 07/07/22  5:30 PM   Specimen: Anterior Nasal Swab  Result Value Ref Range   SARS Coronavirus 2 by RT PCR POSITIVE (A) NEGATIVE   In Ed pt received  Meds ordered this encounter   Medications   cefTRIAXone (ROCEPHIN) 1 g in sodium chloride 0.9 % 100 mL IVPB    Order Specific Question:   Antibiotic Indication:    Answer:   UTI   mirabegron ER (MYRBETRIQ) tablet 25 mg   metoprolol succinate (TOPROL-XL) 24 hr tablet 25 mg   memantine (NAMENDA) tablet 10 mg   levothyroxine (SYNTHROID) tablet 50 mcg   fluticasone (FLONASE) 50 MCG/ACT nasal spray 1 spray   venlafaxine XR (EFFEXOR-XR) 24 hr capsule 75 mg   donepezil (ARICEPT) tablet 10 mg   acetaminophen (TYLENOL) tablet 650 mg   heparin injection 5,000 Units   sodium chloride flush (NS) 0.9 % injection 3 mL   0.9 %  sodium chloride infusion   HYDROcodone-acetaminophen (NORCO/VICODIN) 5-325 MG per tablet 1 tablet   morphine (PF) 2 MG/ML injection 2 mg   nirmatrelvir/ritonavir EUA (renal dosing) (PAXLOVID) 2 tablet   albuterol (VENTOLIN HFA) 108 (90 Base) MCG/ACT inhaler 2 puff   guaiFENesin-dextromethorphan (ROBITUSSIN DM) 100-10 MG/5ML syrup 10 mL   chlorpheniramine-HYDROcodone (TUSSIONEX) 10-8 MG/5ML suspension 5 mL    Unresulted Labs (From admission, onward)     Start     Ordered   07/08/22 0500  Comprehensive metabolic panel  Tomorrow morning,   STAT        07/07/22 2023   07/08/22 0500  CBC  Tomorrow morning,   STAT        07/07/22 2023             Admission Imaging : CT Head Wo  Contrast  Result Date: 07/07/2022 CLINICAL DATA:  Mental status change; history of normal pressure hydrocephalus EXAM: CT HEAD WITHOUT CONTRAST TECHNIQUE: Contiguous axial images were obtained from the base of the skull through the vertex without intravenous contrast. RADIATION DOSE REDUCTION: This exam was performed according to the departmental dose-optimization program which includes automated exposure control, adjustment of the mA and/or kV according to patient size and/or use of iterative reconstruction technique. COMPARISON:  CT head 01/29/2022 FINDINGS: Brain: No intracranial hemorrhage, mass effect, or evidence of acute  infarct. No hydrocephalus. No extra-axial fluid collection. Generalized cerebral atrophy with marked ex vacuo dilatation of the ventricles. Ill-defined hypoattenuation within the cerebral white matter is nonspecific and could be due to chronic small vessel ischemic disease and/or transependymal flow of CSF in the setting of normal pressure hydrocephalus. Chronic bilateral cerebellar infarcts. Vascular: No hyperdense vessel. Intracranial arterial calcification. Skull: No fracture or focal lesion. Sinuses/Orbits: No acute finding. Paranasal sinuses and mastoid air cells are well aerated. Other: None. IMPRESSION: No acute intracranial abnormality. Extensive small-vessel ischemic change and/or transependymal flow of CSF from pressure hydrocephalus. The appearance is unchanged from 01/29/2022. Electronically Signed   By: Placido Sou M.D.   On: 07/07/2022 20:32   DG Chest 2 View  Result Date: 07/07/2022 CLINICAL DATA:  Shortness of breath EXAM: CHEST - 2 VIEW COMPARISON:  Chest x-ray 01/29/2022 FINDINGS: Abandoned pacemaker leads are unchanged in position. The heart size and mediastinal contours are within normal limits. Both lungs are clear. The visualized skeletal structures are unremarkable. IMPRESSION: No active cardiopulmonary disease. Electronically Signed   By: Ronney Asters M.D.   On: 07/07/2022 16:51      Physical Examination: Vitals:   07/07/22 2000 07/07/22 2059 07/07/22 2100 07/07/22 2236  BP: 108/87  131/81 (!) 119/97  Pulse: 66  61 87  Temp:  98.6 F (37 C)  (!) 100.9 F (38.3 C)  Resp: (!) 22  (!) 25 18  Height:    5\' 8"  (1.727 m)  Weight:    73 kg  SpO2: 96%  94% 93%  TempSrc:  Oral  Oral  BMI (Calculated):    24.48   Physical Exam Vitals reviewed.  Constitutional:      Appearance: She is not ill-appearing.  HENT:     Right Ear: External ear normal.     Left Ear: External ear normal.     Nose: Nose normal.     Mouth/Throat:     Mouth: Mucous membranes are moist.  Eyes:      Extraocular Movements: Extraocular movements intact.  Cardiovascular:     Rate and Rhythm: Normal rate and regular rhythm.     Pulses: Normal pulses.     Heart sounds: Normal heart sounds.  Pulmonary:     Effort: Pulmonary effort is normal.     Breath sounds: Normal breath sounds.  Abdominal:     General: Bowel sounds are normal.     Palpations: Abdomen is soft.  Musculoskeletal:     Right lower leg: No edema.     Left lower leg: No edema.  Skin:    General: Skin is warm.  Neurological:     General: No focal deficit present.     Mental Status: She is alert. She is disoriented.     Assessment and Plan: * AMS (altered mental status) Patient brought in for altered mental status and confusion. Patient has a history of dementia. Attribute to underlying infection with COVID and urinary tract infection which we will  treat.   Chronic kidney disease, stage 3b ([place]) Lab Results  Component Value Date   CREATININE 1.25 (H) 07/07/2022   CREATININE 1.23 (H) 01/30/2022   CREATININE 1.41 (H) 01/29/2022  Stable. We will renally dose all needed meds.  Avoid contrast.   Electrolyte abnormality Monitor and replace.  COVID-19 virus infection Covid isolation.  Paxlovid. Antitussive PRN.    Essential hypertension Vitals:   07/07/22 1303 07/07/22 1749 07/07/22 1900 07/07/22 2000  BP: 112/63 (!) 143/48 102/87 108/87   07/07/22 2100 07/07/22 2236  BP: 131/81 (!) 119/97  Continue metoprolol.    Hypothyroidism Continue levothyroxine 50 mcg.    Dementia without behavioral disturbance (HCC) Cont Namenda/ Aricept / Effexor.    Generalized weakness Attribute to her covid-19 PT consult prior to d/c for safety eval.  UTI (urinary tract infection) Continue rocephin.    DVT prophylaxis:  Heparin    Code Status:  Full Code    Family Communication:  Jonika, Engdahl (Daughter)  [phone] (Mobile)   Disposition Plan:  Home    Consults called:  None   Admission  status: Observation.    Unit/ Expected LOS: Med surg/ Med tele.    Para Skeans MD [facility]  6 PM- 2 AM. Please contact me via secure Chat 6 PM-2 AM. [phone] ( Pager ) To contact the [facility] Attending or Consulting provider [place] or covering provider during after hours [place], for this patient.   Check the care team in [facility] and look for a) attending/consulting [facility] provider listed and b) the [facility] team listed Log into [email or web address] and use [place]'s universal password to access. If you do not have the password, please contact the hospital operator. Locate the [facility] provider you are looking for under [facility] and page to a number that you can be directly reached. If you still have difficulty reaching the provider, please page the [facility] (Director on Call) for the Hospitalists listed on amion for assistance. [email or web address] 07/08/2022, 12:14 AM

## 2022-07-08 NOTE — Assessment & Plan Note (Addendum)
Patient will go out to rehab today.

## 2022-07-08 NOTE — Assessment & Plan Note (Addendum)
Completed antibiotic course while here.

## 2022-07-08 NOTE — TOC Initial Note (Signed)
Transition of Care [facility]) - Initial/Assessment Note    Patient Details  Name: Kerri [name] MRN: [id number] Date of Birth: [birth year]  Transition of Care [facility]) CM/SW Contact:    Laurena Slimmer, RN Phone Number: 07/08/2022, 10:43 AM  Clinical Narrative:                  Transition of Care [facility]) Screening Note   Patient Details  Name: Kerri [name] Date of Birth: 10-Feb-1931   Transition of Care [facility]) CM/SW Contact:    Laurena Slimmer, RN Phone Number: 07/08/2022, 10:43 AM    Transition of Care Department [facility]) has reviewed patient and no TOC needs have been identified at this time. We will continue to monitor patient advancement through interdisciplinary progression rounds. If new patient transition needs arise, please place a TOC consult.          Patient Goals and CMS Choice        Expected Discharge Plan and Services                                                Prior Living Arrangements/Services                       Activities of Daily Living Home Assistive Devices/Equipment: Cane (specify quad or straight), Walker (specify type) ADL Screening (condition at time of admission) Patient's cognitive ability adequate to safely complete daily activities?: No Is the patient deaf or have difficulty hearing?: No Does the patient have difficulty seeing, even when wearing glasses/contacts?: No Does the patient have difficulty concentrating, remembering, or making decisions?: Yes Patient able to express need for assistance with ADLs?: No Does the patient have difficulty dressing or bathing?: Yes Independently performs ADLs?: No Does the patient have difficulty walking or climbing stairs?: Yes Weakness of Legs: Both Weakness of Arms/Hands: None  Permission Sought/Granted                  Emotional Assessment              Admission diagnosis:  Lower urinary tract infectious disease [N39.0] Altered mental status, unspecified  altered mental status type [R41.82] AMS (altered mental status) [R41.82] COVID [U07.1] Patient Active Problem List   Diagnosis Date Noted   COVID-19 virus infection 07/08/2022   Electrolyte abnormality 07/08/2022   AMS (altered mental status) 07/07/2022   Acute lower UTI 53/29/9242   Acute metabolic encephalopathy 68/34/1962   Thrush    Constipation    UTI (urinary tract infection) 10/15/2021   Leukocytosis 10/15/2021   Hypokalemia 10/15/2021   Hyponatremia 10/15/2021   Elevated brain natriuretic peptide (BNP) level 10/15/2021   Generalized weakness 10/15/2021   Chronic kidney disease, stage 3b ([place]) 10/15/2021   Normal pressure hydrocephalus ([place]) 10/15/2021   Dementia without behavioral disturbance ([place]) 10/15/2021   Hypothyroidism 10/15/2021   Essential hypertension 10/15/2021   Overactive bladder 10/15/2021   CHB (complete heart block) ([place]) 01/07/2021   Sepsis ([place]) 05/01/2019   PCP:  Donnamarie Rossetti, PA-C Pharmacy:   [place], [place] [address] [place] [address] Phone: [phone] Fax: [phone]     Social Determinants of Health (SDOH) Interventions    Readmission Risk Interventions     No data to display

## 2022-07-08 NOTE — Progress Notes (Signed)
  Progress Note   Patient: Kerri [name]:[id number] DOB: 26-May-1931 DOA: 07/07/2022     0 DOS: the patient was seen and examined on 07/08/2022     Assessment and Plan: * Acute delirium Likely secondary to combination of urinary tract infection and COVID-19 infection.  UTI (urinary tract infection) Continue rocephin.   COVID-19 virus infection Covid isolation.  Paxlovid ordered by admitting physician   Chronic kidney disease, stage 3b ([place]) Creatinine 1.4 with a GFR of 36  Malnutrition of moderate degree Continue supplements  Electrolyte abnormality Monitor and replace.  Essential hypertension Continue metoprolol   Hypothyroidism Continue levothyroxine 50 mcg.    Dementia without behavioral disturbance (HCC) Cont Namenda/ Aricept / Effexor.    Generalized weakness PT and OT evaluation        Subjective: Patient unable to elaborate on answering questions.  States she does not feel well.  Still with following commands.  Family at bedside states she gets this way when she has a urinary tract infection.  Also found to have COVID infection  Physical Exam: Vitals:   07/08/22 0500 07/08/22 0621 07/08/22 0855 07/08/22 1534  BP:  129/66 137/82 (!) 137/98  Pulse:  63 89 91  Resp:  16 16 20   Temp:  98.4 F (36.9 C) 100 F (37.8 C) (!) 100.4 F (38 C)  TempSrc:      SpO2:  96% 96% 91%  Weight: 73 kg     Height:       Physical Exam HENT:     Head: Normocephalic.     Mouth/Throat:     Pharynx: No oropharyngeal exudate.  Eyes:     General: Lids are normal.  Cardiovascular:     Rate and Rhythm: Normal rate and regular rhythm.     Heart sounds: Normal heart sounds, S1 normal and S2 normal.  Pulmonary:     Breath sounds: No decreased breath sounds, wheezing, rhonchi or rales.  Abdominal:     Palpations: Abdomen is soft.     Tenderness: There is no abdominal tenderness.  Musculoskeletal:     Right lower leg: No swelling.     Left lower leg: No  swelling.  Skin:    General: Skin is warm.     Findings: No rash.  Neurological:     Mental Status: She is alert.     Comments: Able to straight leg raise bilaterally.  Difficulty with following commands with eye movements and arm movements.     Data Reviewed: Creatinine 1.4, hemoglobin 14.6, white blood cell count 6.6, urine analysis positive, COVID test positive  Family Communication: Spoke with son at the bedside  Disposition: Status is: Inpatient Remains inpatient appropriate because: Still with confusion with urinary tract infection and COVID-19 infection  Planned Discharge Destination: Home with Home Health    Time spent:28 minutes  Author: Loletha Grayer, MD 07/08/2022 3:49 PM  For on call review [email or web address].

## 2022-07-08 NOTE — Assessment & Plan Note (Addendum)
Hypokalemia replaced

## 2022-07-08 NOTE — Assessment & Plan Note (Signed)
Continue levothyroxine 50 mcg  

## 2022-07-08 NOTE — Assessment & Plan Note (Addendum)
Continue Namenda, Aricept, Effexor.

## 2022-07-08 NOTE — Assessment & Plan Note (Deleted)
Patient brought in for altered mental status and confusion. Patient has a history of dementia. Attribute to underlying infection with COVID and urinary tract infection which we will treat.

## 2022-07-08 NOTE — Assessment & Plan Note (Addendum)
Resolved after 2 days.  This was secondary to COVID-19 infection and UTI.

## 2022-07-08 NOTE — Assessment & Plan Note (Addendum)
Last creatinine 1.11 with a GFR 48.

## 2022-07-09 ENCOUNTER — Ambulatory Visit: Payer: Medicare Other | Admitting: Urology

## 2022-07-09 DIAGNOSIS — N1832 Chronic kidney disease, stage 3b: Secondary | ICD-10-CM | POA: Diagnosis not present

## 2022-07-09 DIAGNOSIS — U071 COVID-19: Secondary | ICD-10-CM | POA: Diagnosis not present

## 2022-07-09 DIAGNOSIS — E878 Other disorders of electrolyte and fluid balance, not elsewhere classified: Secondary | ICD-10-CM

## 2022-07-09 DIAGNOSIS — N3000 Acute cystitis without hematuria: Secondary | ICD-10-CM | POA: Diagnosis not present

## 2022-07-09 DIAGNOSIS — R410 Disorientation, unspecified: Secondary | ICD-10-CM | POA: Diagnosis not present

## 2022-07-09 NOTE — Evaluation (Signed)
Physical Therapy Evaluation Patient Details Name: Kerri [name] MRN: [id number] DOB: 10-25-1930 Today's Date: 07/09/2022  History of Present Illness  Kerri [name] is an 89yoF who comes to [facility] on 07/08/22 2/2 confusion. PMH: leadless PPM, arthritis, UTI, dementia, normal pressure hydrocephalus. Workup reveals (+) COVID test and UTI.  Clinical Impression  Pt admitted with above Dx. Pt has functional limitations due to deficits below (see "PT Problem List"). Pt able to provide details on baseline functional status. Pt in bed on arrival recently finished what lunch she feels like eating. She has dropped her peaches in syrup into the bed which has created a sticky situation later addressed. Pt reports still feeling confused, also limited by a HA in session, and some fluctuating queasiness when upright at EOB. Short sitting tolerance is limited to <3-4 minutes at a time before postural melting. Pt very weak but willing, requires modA for going to EOD and modA for several standing attempts, attempts to take steps unsuccessful. Today pt requires considerable physical assistance to perform bed mobility, transfers, and short distance walking. Patient's performance this date reveals decreased ability, independence, and tolerance in performing all basic mobility required for performance of activities of daily living. Pt requires additional DME, close physical assistance, and cues for safe participate in mobility. Pt will benefit from skilled PT intervention to increase independence and safety with basic mobility in preparation for discharge to the venue listed below.         Recommendations for follow up therapy are one component of a multi-disciplinary discharge planning process, led by the attending physician.  Recommendations may be updated based on patient status, additional functional criteria and insurance authorization.  Follow Up Recommendations Skilled nursing-short term rehab (<3 hours/day) Can  patient physically be transported by private vehicle: No    Assistance Recommended at Discharge Intermittent Supervision/Assistance  Patient can return home with the following  Two people to help with walking and/or transfers    Equipment Recommendations None recommended by PT  Recommendations for Other Services       Functional Status Assessment Patient has had a recent decline in their functional status and demonstrates the ability to make significant improvements in function in a reasonable and predictable amount of time.     Precautions / Restrictions Precautions Precautions: Fall Restrictions Weight Bearing Restrictions: No      Mobility  Bed Mobility Overal bed mobility: Needs Assistance Bed Mobility: Supine to Sit, Sit to Supine     Supine to sit: Mod assist Sit to supine: Mod assist   General bed mobility comments: upon first attempt becomes fatigued and nauseated halfway up then bails    Transfers Overall transfer level: Needs assistance Equipment used: None Transfers: Sit to/from Stand Sit to Stand: Mod assist                Ambulation/Gait Ambulation/Gait assistance:  (attempts made to sides step at EOB prior to return to supine, unable to take any)                Stairs            Wheelchair Mobility    Modified Rankin (Stroke Patients Only)       Balance Overall balance assessment: History of Falls, Needs assistance Sitting-balance support: Single extremity supported, Feet supported Sitting balance-Leahy Scale: Poor Sitting balance - Comments: 2 episodes of commenced slow posterior sway without control  Pertinent Vitals/Pain Pain Assessment Pain Assessment: Faces Faces Pain Scale: Hurts little more Pain Location: HA Pain Intervention(s): Limited activity within patient's tolerance, Monitored during session, Repositioned    Home Living Family/patient expects to be  discharged to:: Private residence Living Arrangements: Alone Available Help at Discharge: Family;Available PRN/intermittently;Personal care attendant Type of Home: House Home Access: Stairs to enter Entrance Stairs-Rails: Can reach both Entrance Stairs-Number of Steps: 3 to front, garage is flat entrance   Home Layout: One level;Laundry or work area in basement;Able to live on main level with bedroom/bathroom Home Equipment: Conservation officer, nature (2 wheels);BSC/3in1;Cane - single point;Grab bars - tub/shower;Shower seat - built in Additional Comments: At prior admission 5 months ago, pt's DIL sits M-F 9am-7pm (assists pt as needed; walks behind pt); DTR comes to bring meals to pt on weekends (breakfast, lunch, and dinner) but does not stay rest of the day    Prior Function Prior Level of Function : Needs assist             Mobility Comments: Prior admission pt reports she's ambulatory with RW but endorses "I fall a lot".       Hand Dominance   Dominant Hand: Right    Extremity/Trunk Assessment   Upper Extremity Assessment Upper Extremity Assessment: Generalized weakness    Lower Extremity Assessment Lower Extremity Assessment: Generalized weakness       Communication      Cognition Arousal/Alertness: Awake/alert Behavior During Therapy: WFL for tasks assessed/performed Overall Cognitive Status: History of cognitive impairments - at baseline (reports to still feel altered)                                          General Comments      Exercises     Assessment/Plan    PT Assessment Patient needs continued PT services  PT Problem List Decreased strength;Decreased range of motion;Decreased activity tolerance;Decreased balance;Decreased mobility;Decreased safety awareness;Decreased knowledge of precautions       PT Treatment Interventions DME instruction;Balance training;Gait training;Stair training;Functional mobility training;Therapeutic  activities;Therapeutic exercise;Patient/family education    PT Goals (Current goals can be found in the Care Plan section)  Acute Rehab PT Goals PT Goal Formulation: Patient unable to participate in goal setting    Frequency Min 2X/week     Co-evaluation               AM-PAC PT "6 Clicks" Mobility  Outcome Measure Help needed turning from your back to your side while in a flat bed without using bedrails?: A Lot Help needed moving from lying on your back to sitting on the side of a flat bed without using bedrails?: A Lot Help needed moving to and from a bed to a chair (including a wheelchair)?: Total Help needed standing up from a chair using your arms (e.g., wheelchair or bedside chair)?: Total Help needed to walk in hospital room?: Total Help needed climbing 3-5 steps with a railing? : Total 6 Click Score: 8    End of Session   Activity Tolerance: Patient tolerated treatment well;Patient limited by fatigue;Treatment limited secondary to medical complications (Comment) (nausea) Patient left: in bed;with call bell/phone within reach;with bed alarm set Nurse Communication: Other (comment) (linen and gown are wet, pt needs assist with cleanup) PT Visit Diagnosis: Unsteadiness on feet (R26.81);Muscle weakness (generalized) (M62.81)    Time: 8657-8469 PT Time Calculation (min) (ACUTE ONLY): 19 min  Charges:   PT Evaluation $PT Eval Moderate Complexity: 1 Mod PT Treatments $Therapeutic Activity: 8-22 mins       2:20 PM, 07/09/22 Rosamaria Lints, PT, DPT Physical Therapist - [facility]  [phone] (ASCOM)    Howell Groesbeck C 07/09/2022, 2:12 PM

## 2022-07-09 NOTE — Progress Notes (Signed)
  Progress Note   Patient: Kerri [name] ENI:[id number] DOB: 1931-04-18 DOA: 07/07/2022     1 DOS: the patient was seen and examined on 07/09/2022     Assessment and Plan: * Acute delirium Likely secondary to combination of urinary tract infection and COVID-19 infection.  Patient's mental status much better today than yesterday.  Able to follow commands and focus better today and hold a better conversation.  UTI (urinary tract infection) Continue rocephin.  Klebsiella pneumoniae growing out of urine culture.  Await sensitivities.  COVID-19 virus infection Covid isolation.  Paxlovid ordered by admitting physician   Chronic kidney disease, stage 3b ([place]) Creatinine 1.4 with a GFR of 36  Malnutrition of moderate degree Continue supplements  Electrolyte abnormality Hypokalemia replaced  Essential hypertension Continue metoprolol   Hypothyroidism Continue levothyroxine 50 mcg.    Dementia without behavioral disturbance (HCC) Cont Namenda/ Aricept / Effexor.    Generalized weakness PT and OT evaluation        Subjective: Patient able to communicate better today and follow commands.  She stated she was weak with the physical therapy team.  Eating okay.  Offers no complaints.  Admitted with COVID-19 infection and UTI and acute metabolic encephalopathy.  Physical Exam: Vitals:   07/08/22 2107 07/09/22 0500 07/09/22 0528 07/09/22 0724  BP: (!) 145/76  (!) 153/73 (!) 143/72  Pulse: 62  (!) 57 (!) 59  Resp: 20  20 16   Temp: (!) 100.6 F (38.1 C)  99 F (37.2 C) 98.1 F (36.7 C)  TempSrc:      SpO2: 91%  91% 94%  Weight:  73.1 kg    Height:       Physical Exam HENT:     Head: Normocephalic.     Mouth/Throat:     Pharynx: No oropharyngeal exudate.  Eyes:     General: Lids are normal.  Cardiovascular:     Rate and Rhythm: Normal rate and regular rhythm.     Heart sounds: Normal heart sounds, S1 normal and S2 normal.  Pulmonary:     Breath sounds: No  decreased breath sounds, wheezing, rhonchi or rales.  Abdominal:     Palpations: Abdomen is soft.     Tenderness: There is no abdominal tenderness.  Musculoskeletal:     Right lower leg: No swelling.     Left lower leg: No swelling.  Skin:    General: Skin is warm.     Findings: No rash.     Comments: And present bilateral lower extremity skin tear on the left lower extremity.  Neurological:     Mental Status: She is alert.     Comments: Able to follow commands better today than yesterday.  Mental status improved from yesterday.     Data Reviewed: Urine culture growing Klebsiella pneumoniae  Family Communication: Left message for son  Disposition: Status is: Inpatient Remains inpatient appropriate because: Awaiting urine culture results.  Planned Discharge Destination: Home with Home Health    Time spent: 28 minutes  Author: Loletha Grayer, MD 07/09/2022 1:42 PM  For on call review [email or web address].

## 2022-07-09 NOTE — Evaluation (Signed)
Occupational Therapy Evaluation Patient Details Name: Kerri [name] MRN: [id number] DOB: 12/09/1930 Today's Date: 07/09/2022   History of Present Illness Kerri [name] is an 76yoF who comes to [facility] on 07/08/22 2/2 confusion. PMH: leadless PPM, arthritis, UTI, dementia, normal pressure hydrocephalus. Workup reveals (+) COVID test and UTI.   Clinical Impression   Patient presenting with decreased independence in self-care, functional mobility, safety, cognition, and endurance. Pt reports she lives alone, but her daughter-in-law assists her during the day and brings food. She also reports she has a walk-in shower, regular toilet, RW, and SPC. Patient currently functioning at mod A for bed mobility. While sitting EOB patient observed with L lateral lean, requiring assistance to sit upright. Mod A for transfers sit <> stand x2 using RW. Requiring frequent VC for hand placement on RW. Patient left in bed with bed alarm set, call bell in reach, and all needs met. Patient will benefit from acute OT to increase overall independence in the areas of ADLs, functional mobility, in order to safely discharge to the next venue of care.       Recommendations for follow up therapy are one component of a multi-disciplinary discharge planning process, led by the attending physician.  Recommendations may be updated based on patient status, additional functional criteria and insurance authorization.   Follow Up Recommendations  Skilled nursing-short term rehab (<3 hours/day)    Assistance Recommended at Discharge Frequent or constant Supervision/Assistance  Patient can return home with the following A lot of help with bathing/dressing/bathroom;Direct supervision/assist for medications management;Assistance with cooking/housework;Help with stairs or ramp for entrance;Direct supervision/assist for financial management;A lot of help with walking and/or transfers    Functional Status Assessment  Patient has had a  recent decline in their functional status and/or demonstrates limited ability to make significant improvements in function in a reasonable and predictable amount of time  Equipment Recommendations  Other (comment) (Defer to next venue of care.)    Recommendations for Other Services       Precautions / Restrictions Precautions Precautions: Fall Restrictions Weight Bearing Restrictions: No      Mobility Bed Mobility Overal bed mobility: Needs Assistance Bed Mobility: Rolling Rolling: Supervision   Supine to sit: Mod assist Sit to supine: Mod assist        Transfers Overall transfer level: Needs assistance Equipment used: Rolling walker (2 wheels) Transfers: Sit to/from Stand Sit to Stand: Mod assist                  Balance Overall balance assessment: History of Falls, Needs assistance Sitting-balance support: Single extremity supported, Feet supported Sitting balance-Leahy Scale: Poor   Postural control: Left lateral lean Standing balance support: Reliant on assistive device for balance, Bilateral upper extremity supported Standing balance-Leahy Scale: Poor                             ADL either performed or assessed with clinical judgement   ADL                                               Vision Baseline Vision/History: 1 Wears glasses              Pertinent Vitals/Pain Pain Assessment Pain Assessment: No/denies pain     Hand Dominance Right   Extremity/Trunk Assessment Upper Extremity  Assessment Upper Extremity Assessment: Generalized weakness   Lower Extremity Assessment Lower Extremity Assessment: Generalized weakness       Communication Communication Communication: No difficulties   Cognition Arousal/Alertness: Awake/alert Behavior During Therapy: WFL for tasks assessed/performed Overall Cognitive Status: History of cognitive impairments - at baseline                                                   Home Living Family/patient expects to be discharged to:: Private residence Living Arrangements: Alone Available Help at Discharge: Family;Available PRN/intermittently;Personal care attendant Type of Home: House Home Access: Stairs to enter [employer] of Steps: 3 to front, garage is flat entrance Entrance Stairs-Rails: Can reach both Home Layout: One level;Laundry or work area in basement;Able to live on main level with bedroom/bathroom     Bathroom Shower/Tub: Walk-in shower         Home Equipment: Conservation officer, nature (2 wheels);BSC/3in1;Cane - single point;Grab bars - tub/shower;Shower seat - built in   Additional Comments: At prior admission 5 months ago, pt's DIL sits M-F 9am-7pm (assists pt as needed; walks behind pt); DTR comes to bring meals to pt on weekends (breakfast, lunch, and dinner) but does not stay rest of the day      Prior Functioning/Environment Prior Level of Function : Needs assist             Mobility Comments: Prior admission pt reports she's ambulatory with RW but endorses "I fall a lot".          OT Problem List: Decreased strength;Decreased cognition;Decreased safety awareness;Decreased activity tolerance;Decreased knowledge of use of DME or AE      OT Treatment/Interventions: Self-care/ADL training;Therapeutic exercise;Energy conservation;Therapeutic activities;DME and/or AE instruction;Cognitive remediation/compensation    OT Goals(Current goals can be found in the care plan section) Acute Rehab OT Goals Patient Stated Goal: none stated OT Goal Formulation: With patient Time For Goal Achievement: 07/23/22 Potential to Achieve Goals: Fair ADL Goals Pt Will Perform Lower Body Bathing: with min assist Pt Will Perform Lower Body Dressing: with min assist Pt Will Transfer to Toilet: with min assist Pt Will Perform Toileting - Clothing Manipulation and hygiene: with min assist  OT Frequency: Min 2X/week        AM-PAC OT "6 Clicks" Daily Activity     Outcome Measure Help from another person eating meals?: A Lot Help from another person taking care of personal grooming?: A Little Help from another person toileting, which includes using toliet, bedpan, or urinal?: A Lot Help from another person bathing (including washing, rinsing, drying)?: A Lot Help from another person to put on and taking off regular upper body clothing?: A Little Help from another person to put on and taking off regular lower body clothing?: A Lot 6 Click Score: 14   End of Session Equipment Utilized During Treatment: Rolling walker (2 wheels) Nurse Communication: Mobility status  Activity Tolerance: Patient tolerated treatment well Patient left: in chair;with call bell/phone within reach;with chair alarm set  OT Visit Diagnosis: Unsteadiness on feet (R26.81);Muscle weakness (generalized) (M62.81);History of falling (Z91.81);Other symptoms and signs involving cognitive function                Time: 2111-7356 OT Time Calculation (min): 22 min Charges:       Tomasa Blase, OTS 07/09/2022, 3:25 PM

## 2022-07-09 NOTE — TOC Progression Note (Signed)
Transition of Care [facility]) - Progression Note    Patient Details  Name: Kerri [name] MRN: [id number] Date of Birth: 06-05-1931  Transition of Care [facility]) CM/SW Contact  Laurena Slimmer, RN Phone Number: 07/09/2022, 4:32 PM  Clinical Narrative:    Per Corene Cornea at [facility] patient is active.         Expected Discharge Plan and Services                                                 Social Determinants of Health (SDOH) Interventions    Readmission Risk Interventions     No data to display

## 2022-07-10 DIAGNOSIS — U071 COVID-19: Secondary | ICD-10-CM | POA: Diagnosis not present

## 2022-07-10 DIAGNOSIS — R197 Diarrhea, unspecified: Secondary | ICD-10-CM

## 2022-07-10 DIAGNOSIS — N1832 Chronic kidney disease, stage 3b: Secondary | ICD-10-CM | POA: Diagnosis not present

## 2022-07-10 DIAGNOSIS — R410 Disorientation, unspecified: Secondary | ICD-10-CM | POA: Diagnosis not present

## 2022-07-10 DIAGNOSIS — N3000 Acute cystitis without hematuria: Secondary | ICD-10-CM | POA: Diagnosis not present

## 2022-07-10 LAB — CBC
HCT: 46.8 % — ABNORMAL HIGH (ref 36.0–46.0)
Hemoglobin: 15.4 g/dL — ABNORMAL HIGH (ref 12.0–15.0)
MCH: 28.2 pg (ref 26.0–34.0)
MCHC: 32.9 g/dL (ref 30.0–36.0)
MCV: 85.7 fL (ref 80.0–100.0)
Platelets: 193 10*3/uL (ref 150–400)
RBC: 5.46 MIL/uL — ABNORMAL HIGH (ref 3.87–5.11)
RDW: 13.5 % (ref 11.5–15.5)
WBC: 5 10*3/uL (ref 4.0–10.5)
nRBC: 0 % (ref 0.0–0.2)

## 2022-07-10 LAB — BASIC METABOLIC PANEL
Anion gap: 9 (ref 5–15)
BUN: 24 mg/dL — ABNORMAL HIGH (ref 8–23)
CO2: 22 mmol/L (ref 22–32)
Calcium: 8.1 mg/dL — ABNORMAL LOW (ref 8.9–10.3)
Chloride: 106 mmol/L (ref 98–111)
Creatinine, Ser: 1.35 mg/dL — ABNORMAL HIGH (ref 0.44–1.00)
GFR, Estimated: 38 mL/min — ABNORMAL LOW (ref >60)
Glucose, Bld: 93 mg/dL (ref 70–99)
Potassium: 3.5 mmol/L (ref 3.5–5.1)
Sodium: 137 mmol/L (ref 135–145)

## 2022-07-10 LAB — URINE CULTURE
Culture: >=100000 — AB
Special Requests: NORMAL

## 2022-07-10 MED ORDER — OYSTER SHELL CALCIUM/D3 500-5 MG-MCG PO TABS
1.0000 | ORAL_TABLET | Freq: Two times a day (BID) | ORAL | Status: DC
  Administered 2022-07-10 – 2022-07-21 (×22): 1 via ORAL
  Filled 2022-07-10 (×22): qty 1

## 2022-07-10 MED ORDER — METOPROLOL SUCCINATE ER 25 MG PO TB24
12.5000 mg | ORAL_TABLET | Freq: Every day | ORAL | Status: DC
  Administered 2022-07-11: 12.5 mg via ORAL
  Filled 2022-07-10: qty 1

## 2022-07-10 NOTE — Assessment & Plan Note (Addendum)
In the beginning of the hospital course secondary to COVID-19 infection.

## 2022-07-10 NOTE — TOC Initial Note (Addendum)
Transition of Care [facility]) - Initial/Assessment Note    Patient Details  Name: Kerri [name] MRN: [id number] Date of Birth: 06/10/1931  [facility] CM/SW Contact:    Magnus Ivan, LCSW Phone Number: 07/10/2022, 10:13 AM  Clinical Narrative:                 CSW spoke with daughter [name] via phone. [name] stated patient lives alone, but has family and private pay aides providing care. PCP is Dr. Venetia Maxon. Pharmacy is [place]. Patient has [facility] recently. Family provides transportation. Patient went to [facility] in the past.  [name] is agreeable to SNF and understands patient would have to wait until 10 days from her positive COVID test on 9/27. [name] stated the family has already reached out to [facility] who stated they can take patient after her quarantine. CSW sent out SNF referral, TOC to follow up with [employer] and get insurance auth closer to 10/7.   Expected Discharge Plan: Skilled Nursing Facility Barriers to Discharge: Continued Medical Work up   Patient Goals and CMS Choice Patient states their goals for this hospitalization and ongoing recovery are:: SNF CMS Medicare.gov Compare Post Acute Care list provided to:: Patient Represenative (must comment) Choice offered to / list presented to : Adult Children  Expected Discharge Plan and Services Expected Discharge Plan: [place] arrangements for the past 2 months: Single Family Home                                      Prior Living Arrangements/Services Living arrangements for the past 2 months: Single Family Home Lives with:: Self Patient language and need for interpreter reviewed:: Yes Do you feel safe going back to the place where you live?: Yes      Need for Family Participation in Patient Care: Yes (Comment) Care giver support system in place?: Yes (comment) Current home services: DME, Sitter Criminal Activity/Legal Involvement  Pertinent to Current Situation/Hospitalization: No - Comment as needed  Activities of Daily Living Home Assistive Devices/Equipment: Cane (specify quad or straight), Walker (specify type) ADL Screening (condition at time of admission) Patient's cognitive ability adequate to safely complete daily activities?: No Is the patient deaf or have difficulty hearing?: No Does the patient have difficulty seeing, even when wearing glasses/contacts?: No Does the patient have difficulty concentrating, remembering, or making decisions?: Yes Patient able to express need for assistance with ADLs?: No Does the patient have difficulty dressing or bathing?: Yes Independently performs ADLs?: No Does the patient have difficulty walking or climbing stairs?: Yes Weakness of Legs: Both Weakness of Arms/Hands: None  Permission Sought/Granted Permission sought to share information with : Chartered certified accountant granted to share information with : Yes, Verbal Permission Granted (by [name])     Permission granted to share info w AGENCY: SNFs        Emotional Assessment       Orientation: : Oriented to Self Alcohol / Substance Use: Not Applicable Psych Involvement: No (comment)  Admission diagnosis:  Lower urinary tract infectious disease [N39.0] Altered mental status, unspecified altered mental status type [R41.82] AMS (altered mental status) [R41.82] COVID [O70.9] Acute metabolic encephalopathy [G28.36] Patient Active Problem List   Diagnosis Date Noted   COVID-19 virus infection 07/08/2022   Electrolyte abnormality 07/08/2022   Malnutrition of moderate degree 07/08/2022  Acute delirium 07/08/2022   Acute lower UTI 01/30/2022   Thrush    Constipation    UTI (urinary tract infection) 10/15/2021   Leukocytosis 10/15/2021   Hypokalemia 10/15/2021   Hyponatremia 10/15/2021   Elevated brain natriuretic peptide (BNP) level 10/15/2021   Generalized weakness 10/15/2021   Chronic  kidney disease, stage 3b ([employer]) 10/15/2021   Normal pressure hydrocephalus ([place]) 10/15/2021   Dementia without behavioral disturbance ([place]) 10/15/2021   Hypothyroidism 10/15/2021   Essential hypertension 10/15/2021   Overactive bladder 10/15/2021   CHB (complete heart block) ([place]) 01/07/2021   Sepsis ([place]) 05/01/2019   PCP:  Donnamarie Rossetti, PA-C Pharmacy:   [place], [place] - [address] [place] [address] Phone: [phone] Fax: [phone]     Social Determinants of Health (SDOH) Interventions    Readmission Risk Interventions     No data to display

## 2022-07-10 NOTE — Progress Notes (Addendum)
  Progress Note   Patient: Kerri [name] [id number]:[id number] DOB: 1931/03/25 DOA: 07/07/2022     2 DOS: the patient was seen and examined on 07/10/2022     Assessment and Plan: * Acute delirium Likely secondary to combination of urinary tract infection and COVID-19 infection.  Patient's mental status much better than on admission.  UTI (urinary tract infection) Continue rocephin.  Klebsiella pneumoniae growing out of urine culture.   COVID-19 virus infection Covid isolation.  Paxlovid.    Chronic kidney disease, stage 3b (HCC) Creatinine 1.35 with a GFR of 38  Diarrhea Likely secondary to COVID-19 infection.  Malnutrition of moderate degree Continue supplements  Electrolyte abnormality Hypokalemia replaced  Essential hypertension Continue metoprolol succinate at decreased dose 12.5 mg daily.   Hypothyroidism Continue levothyroxine 50 mcg.    Dementia without behavioral disturbance (HCC) Continue Namenda, Aricept, Effexor.    Generalized weakness Continue PT and OT evaluations.  Currently recommending rehab        Subjective: Patient states that she does not feel well but cannot elaborate.  As per patient's daughter, she says that she does not feel well a lot.  Had some diarrhea.  Admitted with acute metabolic encephalopathy UTI and COVID-19 infection.  Physical Exam: Vitals:   07/09/22 2127 07/10/22 0341 07/10/22 0350 07/10/22 0735  BP: 119/73 122/77  124/76  Pulse: (!) 58 (!) 58  (!) 59  Resp: 19 18  20   Temp: 99.4 F (37.4 C) 97.8 F (36.6 C)  97.9 F (36.6 C)  TempSrc:      SpO2: 93% 93%  90%  Weight:   72.4 kg   Height:       Physical Exam HENT:     Head: Normocephalic.     Mouth/Throat:     Pharynx: No oropharyngeal exudate.  Eyes:     General: Lids are normal.  Cardiovascular:     Rate and Rhythm: Normal rate and regular rhythm.     Heart sounds: Normal heart sounds, S1 normal and S2 normal.  Pulmonary:     Breath sounds: No decreased  breath sounds, wheezing, rhonchi or rales.  Abdominal:     Palpations: Abdomen is soft.     Tenderness: There is no abdominal tenderness.  Musculoskeletal:     Right lower leg: No swelling.     Left lower leg: No swelling.  Skin:    General: Skin is warm.     Findings: No rash.     Comments: And present bilateral lower extremity skin tear on the left lower extremity.  Neurological:     Mental Status: She is alert.     Comments: Able to straight leg raise.     Data Reviewed: Urine culture growing Klebsiella Plan blood cell count 5.0, hemoglobin 15.4, platelet count 193, creatinine 1.35  Family Communication: Spoke with patient's daughter on the phone  Disposition: Status is: Inpatient Remains inpatient appropriate because: Will need To be here for 10 days of isolation prior to going out to rehab.  Planned Discharge Destination: Rehab    Time spent: 28 minutes  Author: Loletha Grayer, MD 07/10/2022 5:16 PM  For on call review [email or web address].

## 2022-07-10 NOTE — NC FL2 (Signed)
[place] LEVEL OF CARE SCREENING TOOL     IDENTIFICATION  Patient Name: Kerri [name] Birthdate: 06/17/1931 Sex: female Admission Date (Current Location): 07/07/2022  [facility] and [place] Number:  Engineering geologist and Address:  [facility], [address], [place], [place] [address]      Provider Number: [id number]  Attending Physician Name and Address:  Loletha Grayer, MD  Relative Name and Phone Number:  Reagann, Dolce (Daughter)   [phone] (Mobile)    Current Level of Care: Hospital Recommended Level of Care: [place] Prior Approval Number:    Date Approved/Denied:   PASRR Number: [id number]  Discharge Plan:      Current Diagnoses: Patient Active Problem List   Diagnosis Date Noted   COVID-19 virus infection 07/08/2022   Electrolyte abnormality 07/08/2022   Malnutrition of moderate degree 07/08/2022   Acute delirium 07/08/2022   Acute lower UTI 01/30/2022   Thrush    Constipation    UTI (urinary tract infection) 10/15/2021   Leukocytosis 10/15/2021   Hypokalemia 10/15/2021   Hyponatremia 10/15/2021   Elevated brain natriuretic peptide (BNP) level 10/15/2021   Generalized weakness 10/15/2021   Chronic kidney disease, stage 3b ([place]) 10/15/2021   Normal pressure hydrocephalus ([place]) 10/15/2021   Dementia without behavioral disturbance ([place]) 10/15/2021   Hypothyroidism 10/15/2021   Essential hypertension 10/15/2021   Overactive bladder 10/15/2021   CHB (complete heart block) ([place]) 01/07/2021   Sepsis ([place]) 05/01/2019    Orientation RESPIRATION BLADDER Height & Weight     Self  Normal Incontinent, External catheter Weight: 159 lb 9.8 oz (72.4 kg) Height:  5\' 8"  (172.7 cm)  BEHAVIORAL SYMPTOMS/MOOD NEUROLOGICAL BOWEL NUTRITION STATUS      Incontinent Diet (heart)  AMBULATORY STATUS COMMUNICATION OF NEEDS Skin   Extensive Assist Verbally Normal                       Personal Care  Assistance Level of Assistance  Bathing, Feeding, Dressing Bathing Assistance: Maximum assistance Feeding assistance: Maximum assistance Dressing Assistance: Maximum assistance     Functional Limitations Info             SPECIAL CARE FACTORS FREQUENCY  PT (By licensed PT), OT (By licensed OT)     PT Frequency: 5 times per week OT Frequency: 5 times per week            Contractures      Additional Factors Info  Code Status, Allergies Code Status Info: full Allergies Info: Alendronate Sodium, Bupropion, Rofecoxib, Alendronate           Current Medications (07/10/2022):  This is the current hospital active medication list Current Facility-Administered Medications  Medication Dose Route Frequency Provider Last Rate Last Admin   acetaminophen (TYLENOL) tablet 650 mg  650 mg Oral Q6H PRN Para Skeans, MD   650 mg at 07/07/22 2330   albuterol (VENTOLIN HFA) 108 (90 Base) MCG/ACT inhaler 2 puff  2 puff Inhalation Q6H Florina Ou V, MD   2 puff at 07/10/22 0910   cefTRIAXone (ROCEPHIN) 1 g in sodium chloride 0.9 % 100 mL IVPB  1 g Intravenous Q24H Florina Ou V, MD 200 mL/hr at 07/09/22 2237 1 g at 07/09/22 2237   chlorpheniramine-HYDROcodone (TUSSIONEX) 10-8 MG/5ML suspension 5 mL  5 mL Oral Q12H PRN Para Skeans, MD       donepezil (ARICEPT) tablet 10 mg  10 mg Oral QHS Para Skeans, MD  10 mg at 07/09/22 2232   fluticasone (FLONASE) 50 MCG/ACT nasal spray 1 spray  1 spray Each Nare Daily PRN Para Skeans, MD       guaiFENesin-dextromethorphan (ROBITUSSIN DM) 100-10 MG/5ML syrup 10 mL  10 mL Oral Q4H PRN Para Skeans, MD   10 mL at 07/07/22 2330   heparin injection 5,000 Units  5,000 Units Subcutaneous Q8H Para Skeans, MD   5,000 Units at 07/10/22 0502   HYDROcodone-acetaminophen (NORCO/VICODIN) 5-325 MG per tablet 1 tablet  1 tablet Oral Q4H PRN Para Skeans, MD       levothyroxine (SYNTHROID) tablet 50 mcg  50 mcg Oral QAC breakfast Para Skeans, MD   50 mcg at  07/10/22 0502   memantine (NAMENDA) tablet 10 mg  10 mg Oral BID Para Skeans, MD   10 mg at 07/10/22 0915   metoprolol succinate (TOPROL-XL) 24 hr tablet 25 mg  25 mg Oral Daily Florina Ou V, MD   25 mg at 07/10/22 0915   mirabegron ER (MYRBETRIQ) tablet 25 mg  25 mg Oral Daily Para Skeans, MD   25 mg at 07/09/22 0926   morphine (PF) 2 MG/ML injection 2 mg  2 mg Intravenous Q4H PRN Para Skeans, MD       nirmatrelvir/ritonavir EUA (renal dosing) (PAXLOVID) 2 tablet  2 tablet Oral BID Para Skeans, MD   2 tablet at 07/10/22 0914   sodium chloride flush (NS) 0.9 % injection 3 mL  3 mL Intravenous Q12H Florina Ou V, MD   3 mL at 07/10/22 0918   venlafaxine XR (EFFEXOR-XR) 24 hr capsule 75 mg  75 mg Oral Q breakfast Para Skeans, MD   75 mg at 07/10/22 0915     Discharge Medications: Please see discharge summary for a list of discharge medications.  Relevant Imaging Results:  Relevant Lab Results:   Additional Information SS #: [id number]  [place], LCSW

## 2022-07-11 DIAGNOSIS — R42 Dizziness and giddiness: Secondary | ICD-10-CM

## 2022-07-11 DIAGNOSIS — R55 Syncope and collapse: Secondary | ICD-10-CM

## 2022-07-11 DIAGNOSIS — R197 Diarrhea, unspecified: Secondary | ICD-10-CM

## 2022-07-11 DIAGNOSIS — R410 Disorientation, unspecified: Secondary | ICD-10-CM | POA: Diagnosis not present

## 2022-07-11 DIAGNOSIS — U071 COVID-19: Secondary | ICD-10-CM | POA: Diagnosis not present

## 2022-07-11 DIAGNOSIS — N3000 Acute cystitis without hematuria: Secondary | ICD-10-CM | POA: Diagnosis not present

## 2022-07-11 MED ORDER — LOPERAMIDE HCL 2 MG PO CAPS: 2.0000 mg | ORAL_CAPSULE | Freq: Three times a day (TID) | ORAL | Status: DC | PRN

## 2022-07-11 MED ORDER — BARRIER CREAM NON-SPECIFIED: 1.0000 | TOPICAL_CREAM | Freq: Two times a day (BID) | TOPICAL | Status: DC | PRN

## 2022-07-11 MED ORDER — NYSTATIN 100000 UNIT/GM EX POWD
Freq: Two times a day (BID) | CUTANEOUS | Status: DC
  Filled 2022-07-11: qty 15

## 2022-07-11 MED ORDER — ONDANSETRON HCL 4 MG/2ML IJ SOLN: 4.0000 mg | Freq: Four times a day (QID) | INTRAMUSCULAR | Status: DC | PRN

## 2022-07-11 MED ORDER — CEPHALEXIN 500 MG PO CAPS
500.0000 mg | ORAL_CAPSULE | Freq: Two times a day (BID) | ORAL | Status: AC
  Administered 2022-07-11 – 2022-07-13 (×6): 500 mg via ORAL
  Filled 2022-07-11 (×6): qty 1

## 2022-07-11 MED ORDER — SODIUM CHLORIDE 0.9 % IV BOLUS
250.0000 mL | Freq: Once | INTRAVENOUS | Status: AC
  Administered 2022-07-11: 250 mL via INTRAVENOUS

## 2022-07-11 NOTE — Progress Notes (Signed)
  Progress Note   Patient: Kerri [name] [id number]:[id number] DOB: 02-03-1931 DOA: 07/07/2022     3 DOS: the patient was seen and examined on 07/11/2022     Assessment and Plan: * Acute delirium Much improved from admission.  Likely secondary to combination of urinary tract infection and COVID-19 infection.    Postural dizziness with near syncope With working with PT, Patient became lightheaded and blood pressure dropped.  He was lied back down the bed.  Fluid bolus given.  We will hold beta-blocker.  Keep working with PT.  COVID-19 virus infection Covid isolation.  Paxlovid.    UTI (urinary tract infection) Klebsiella pneumoniae growing out of urine culture.  Rocephin switched over to Keflex.  Chronic kidney disease, stage 3b (HCC) Creatinine 1.35 with a GFR of 38  Diarrhea Likely secondary to COVID-19 infection.  As needed Imodium if further diarrhea.  Malnutrition of moderate degree Continue supplements  Electrolyte abnormality Hypokalemia replaced  Essential hypertension Discontinue beta-blocker when episode today.   Hypothyroidism Continue levothyroxine 50 mcg.    Dementia without behavioral disturbance (HCC) Continue Namenda, Aricept, Effexor.    Generalized weakness Continue PT and OT evaluations.  Currently recommending rehab        Subjective: Patient does not feel to good.  States she feels weak.  No shortness of breath or cough.  No burning on urination.  Admitted with acute delirium and found to have COVID-19 infection and urinary tract infection.  Physical Exam: Vitals:   07/10/22 2211 07/11/22 0816 07/11/22 1236 07/11/22 1322  BP: 110/89 (!) 142/64 90/75 125/81  Pulse: 62 62 61 60  Resp: 18 16  18   Temp: 97.9 F (36.6 C) 97.6 F (36.4 C)  97.8 F (36.6 C)  TempSrc:  Oral    SpO2: 99% 96%  96%  Weight:      Height:       Physical Exam HENT:     Head: Normocephalic.     Mouth/Throat:     Pharynx: No oropharyngeal exudate.  Eyes:      General: Lids are normal.  Cardiovascular:     Rate and Rhythm: Normal rate and regular rhythm.     Heart sounds: Normal heart sounds, S1 normal and S2 normal.  Pulmonary:     Breath sounds: No decreased breath sounds, wheezing, rhonchi or rales.  Abdominal:     Palpations: Abdomen is soft.     Tenderness: There is no abdominal tenderness.  Musculoskeletal:     Right lower leg: No swelling.     Left lower leg: No swelling.  Skin:    General: Skin is warm.     Findings: No rash.     Comments: And present bilateral lower extremity skin tear on the left lower extremity.  Neurological:     Mental Status: She is alert.     Comments: Able to straight leg raise.     Data Reviewed: Last creatinine 1.35  Family Communication: Spoke with the patient ate at the bedside this morning and patient's daughter on the phone this afternoon.  Disposition: Status is: Inpatient Remains inpatient appropriate because: Still very weak and physical therapy recommending rehab.  Will remain in [place] isolation here in the hospital prior to going out to rehab.  Planned Discharge Destination: Rehab    Time spent: 27 minutes  Author: Loletha Grayer, MD 07/11/2022 3:50 PM  For on call review [email or web address].

## 2022-07-11 NOTE — Assessment & Plan Note (Signed)
With working with PT, Patient became lightheaded and blood pressure dropped.  He was lied back down the bed.  Fluid bolus given.  We will hold beta-blocker.  Keep working with PT.

## 2022-07-11 NOTE — Progress Notes (Signed)
Physical Therapy Treatment Patient Details Name: Kerri [name] MRN: [id number] DOB: 11-Jun-1931 Today's Date: 07/11/2022   History of Present Illness Demaria Deeney is an 89yoF who comes to [facility] on 07/08/22 2/2 confusion. PMH: leadless PPM, arthritis, UTI, dementia, normal pressure hydrocephalus. Workup reveals (+) COVID test and UTI.    PT Comments    Pt willing and ready to try session.  Feeling better and engaged initially in session. To EOB with increased time and min a x 1 to get fully to EOB.  Generally steady in sitting with distant supervision.  She is able to stand with mod a x 1 after several attempts.  Right post lean with mod a to remain upright.  She is able to take one small sidestep to left and 2 small forward and back with 1 seated rest in between.  While BSC was being placed next to bed, pt lists to right and becomes briefly unresponsive.  She is assisted with total assist to supine and generally "floppy" feel.  She lists right even when in bed and slowly comes around and is verbal.  Diaphoretic.  Reports nausea and need to have BM.  RN to room when called.  She has small loose BM during process and bedpan is placed.  RN remains in room and communication with MD follows via secure chat.  Will plan to check orthostatics next session.   Recommendations for follow up therapy are one component of a multi-disciplinary discharge planning process, led by the attending physician.  Recommendations may be updated based on patient status, additional functional criteria and insurance authorization.  Follow Up Recommendations  Skilled nursing-short term rehab (<3 hours/day)     Assistance Recommended at Discharge Intermittent Supervision/Assistance  Patient can return home with the following Assistance with cooking/housework;Help with stairs or ramp for entrance;A lot of help with walking and/or transfers;A little help with bathing/dressing/bathroom;Assist for transportation   Equipment  Recommendations       Recommendations for Other Services       Precautions / Restrictions Precautions Precautions: Fall Precaution Comments: ? orthostatc Restrictions Weight Bearing Restrictions: No     Mobility  Bed Mobility Overal bed mobility: Needs Assistance Bed Mobility: Supine to Sit Rolling: Min assist   Supine to sit: Total assist     General bed mobility comments: total assist to lay down due to decreased consiousness    Transfers   Equipment used: Rolling walker (2 wheels) Transfers: Sit to/from Stand Sit to Stand: Mod assist                Ambulation/Gait Ambulation/Gait assistance: Mod assist Gait Distance (Feet): 3 Feet Assistive device: Rolling walker (2 wheels) Gait Pattern/deviations: Step-to pattern Gait velocity: decreased     General Gait Details: 2 steps forward 1 back   Stairs             Wheelchair Mobility    Modified Rankin (Stroke Patients Only)       Balance Overall balance assessment: History of Falls, Needs assistance Sitting-balance support: Single extremity supported, Feet supported Sitting balance-Leahy Scale: Fair   Postural control: Left lateral lean Standing balance support: Reliant on assistive device for balance, Bilateral upper extremity supported Standing balance-Leahy Scale: Poor                              Cognition Arousal/Alertness: Awake/alert Behavior During Therapy: WFL for tasks assessed/performed Overall Cognitive Status: History of cognitive impairments - at  baseline                                          Exercises      General Comments        Pertinent Vitals/Pain Pain Assessment Pain Assessment: No/denies pain    Home Living                          Prior Function            PT Goals (current goals can now be found in the care plan section) Progress towards PT goals: Progressing toward goals    Frequency    Min  2X/week      PT Plan Current plan remains appropriate    Co-evaluation              AM-PAC PT "6 Clicks" Mobility   Outcome Measure  Help needed turning from your back to your side while in a flat bed without using bedrails?: A Little Help needed moving from lying on your back to sitting on the side of a flat bed without using bedrails?: A Lot Help needed moving to and from a bed to a chair (including a wheelchair)?: A Lot Help needed standing up from a chair using your arms (e.g., wheelchair or bedside chair)?: A Lot Help needed to walk in hospital room?: A Lot Help needed climbing 3-5 steps with a railing? : Total 6 Click Score: 12    End of Session Equipment Utilized During Treatment: Gait belt Activity Tolerance: Patient tolerated treatment well;Patient limited by fatigue;Treatment limited secondary to medical complications (Comment) Patient left: in bed;with call bell/phone within reach;with bed alarm set Nurse Communication: Other (comment) PT Visit Diagnosis: Unsteadiness on feet (R26.81);Muscle weakness (generalized) (M62.81)     Time: 8841-6606 PT Time Calculation (min) (ACUTE ONLY): 20 min  Charges:  $Therapeutic Activity: 8-22 mins                   Chesley Noon, PTA 07/11/22, 12:45 PM

## 2022-07-12 DIAGNOSIS — R410 Disorientation, unspecified: Secondary | ICD-10-CM | POA: Diagnosis not present

## 2022-07-12 DIAGNOSIS — U071 COVID-19: Secondary | ICD-10-CM | POA: Diagnosis not present

## 2022-07-12 DIAGNOSIS — I951 Orthostatic hypotension: Secondary | ICD-10-CM | POA: Diagnosis not present

## 2022-07-12 DIAGNOSIS — N3000 Acute cystitis without hematuria: Secondary | ICD-10-CM | POA: Diagnosis not present

## 2022-07-12 LAB — CBC
HCT: 44.3 % (ref 36.0–46.0)
Hemoglobin: 14.4 g/dL (ref 12.0–15.0)
MCH: 27.4 pg (ref 26.0–34.0)
MCHC: 32.5 g/dL (ref 30.0–36.0)
MCV: 84.4 fL (ref 80.0–100.0)
Platelets: 198 10*3/uL (ref 150–400)
RBC: 5.25 MIL/uL — ABNORMAL HIGH (ref 3.87–5.11)
RDW: 13.2 % (ref 11.5–15.5)
WBC: 5.8 10*3/uL (ref 4.0–10.5)
nRBC: 0 % (ref 0.0–0.2)

## 2022-07-12 LAB — BASIC METABOLIC PANEL
Anion gap: 8 (ref 5–15)
BUN: 24 mg/dL — ABNORMAL HIGH (ref 8–23)
CO2: 24 mmol/L (ref 22–32)
Calcium: 8.8 mg/dL — ABNORMAL LOW (ref 8.9–10.3)
Chloride: 106 mmol/L (ref 98–111)
Creatinine, Ser: 1.38 mg/dL — ABNORMAL HIGH (ref 0.44–1.00)
GFR, Estimated: 37 mL/min — ABNORMAL LOW (ref >60)
Glucose, Bld: 95 mg/dL (ref 70–99)
Potassium: 3.8 mmol/L (ref 3.5–5.1)
Sodium: 138 mmol/L (ref 135–145)

## 2022-07-12 MED ORDER — SODIUM CHLORIDE 0.9 % IV BOLUS
500.0000 mL | Freq: Once | INTRAVENOUS | Status: AC
  Administered 2022-07-12: 500 mL via INTRAVENOUS

## 2022-07-12 NOTE — Assessment & Plan Note (Addendum)
Beta-blocker discontinued on 07/11/2022.  Fluid bolus given on 07/11/2022, 07/12/2022 and 07/13/2022.

## 2022-07-12 NOTE — Progress Notes (Signed)
  Progress Note   Patient: Kerri [name] [id number]:[id number] DOB: Apr 11, 1931 DOA: 07/07/2022     4 DOS: the patient was seen and examined on 07/12/2022    Assessment and Plan: * Orthostatic hypotension Family stated that she was orthostatic with working with occupational therapy today.  We will give a fluid bolus.  Beta-blocker discontinued yesterday.  Acute delirium Resolved.  Likely secondary to combination of urinary tract infection and COVID-19 infection.    COVID-19 virus infection Covid isolation.  Paxlovid.    UTI (urinary tract infection) Klebsiella pneumoniae growing out of urine culture.  Was given Rocephin initially and now on p.o. Keflex.  Chronic kidney disease, stage 3b (HCC) Creatinine 1.38 with a GFR of 37  Diarrhea Likely secondary to COVID-19 infection.  As needed Imodium if further diarrhea.  Malnutrition of moderate degree Continue supplements  Electrolyte abnormality Hypokalemia replaced  Essential hypertension Discontinued beta-blocker on 07/11/2022 with near syncopal episode with working with physical therapy.  Patient orthostatic today.   Hypothyroidism Continue levothyroxine 50 mcg.    Dementia without behavioral disturbance (HCC) Continue Namenda, Aricept, Effexor.    Generalized weakness Continue PT and OT evaluations.  Currently recommending rehab.        Subjective: Patient seen this morning and she was sitting in the chair.  Patient states that she does not feel too well.  Feels weak.  Family told me that the patient was orthostatic with occupational therapy today.  Physical Exam: Vitals:   07/11/22 1322 07/11/22 1619 07/11/22 2226 07/12/22 0757  BP: 125/81 101/68 119/76 129/80  Pulse: 60 61 (!) 59 62  Resp: 18 16 16    Temp: 97.8 F (36.6 C) 98 F (36.7 C) 97.7 F (36.5 C) (!) 97.4 F (36.3 C)  TempSrc:      SpO2: 96% 95% 96% 93%  Weight:      Height:       Physical Exam HENT:     Head: Normocephalic.     Mouth/Throat:      Pharynx: No oropharyngeal exudate.  Eyes:     General: Lids are normal.  Cardiovascular:     Rate and Rhythm: Normal rate and regular rhythm.     Heart sounds: Normal heart sounds, S1 normal and S2 normal.  Pulmonary:     Breath sounds: No decreased breath sounds, wheezing, rhonchi or rales.  Abdominal:     Palpations: Abdomen is soft.     Tenderness: There is no abdominal tenderness.  Musculoskeletal:     Right lower leg: No swelling.     Left lower leg: No swelling.  Skin:    General: Skin is warm.     Findings: No rash.     Comments: And present bilateral lower extremity skin tear on the left lower extremity.  Neurological:     Mental Status: She is alert.     Comments: Answers some simple questions appropriately.  Not the best historian.     Data Reviewed: Creatinine 1.38, CBC within normal range  Family Communication: Spoke with patient's daughter on the phone  Disposition: Status is: Inpatient Remains inpatient appropriate because: Patient is orthostatic today.  We will give a fluid bolus. Planned Discharge Destination: Skilled nursing facility    Time spent: 27 minutes  Author: Loletha Grayer, MD 07/12/2022 4:10 PM  For on call review [email or web address].

## 2022-07-12 NOTE — TOC Progression Note (Signed)
Transition of Care [facility]) - Progression Note    Patient Details  Name: Kerri [name] MRN: [id number] Date of Birth: Oct 21, 1930  [facility] CM/SW [place], LCSW Phone Number: 07/12/2022, 10:40 AM  Clinical Narrative:  [facility] declined bed offer. Called admissions coordinator and she confirmed they cannot meet her medical needs. Daughter is aware. Expanding SNF search.  Expected Discharge Plan: [place] Barriers to Discharge: Continued Medical Work up  Expected Discharge Plan and Services Expected Discharge Plan: [place] arrangements for the past 2 months: Single Family Home                                       Social Determinants of Health (SDOH) Interventions    Readmission Risk Interventions     No data to display

## 2022-07-12 NOTE — Progress Notes (Signed)
Occupational Therapy Treatment Patient Details Name: Kerri [name] MRN: [id number] DOB: 1931/09/08 Today's Date: 07/12/2022   History of present illness Kerri [name] is an 72yoF who comes to [facility] on 07/08/22 2/2 confusion. PMH: leadless PPM, arthritis, UTI, dementia, normal pressure hydrocephalus. Workup reveals (+) COVID test and UTI.   OT comments  Patient in bed asleep upon arrival, easily aroused for therapy. Caregiver present in room. Patient required mod A for bed mobility and transfer sit >stand x2. Orthostatics monitored throughout session. Supine: 124/64 (82) , EOB:136/65 (77), Standing: 77/64( 69) (patient placed back in bed; reporting slight dizziness; trendelenburg), and Supine: 111/82 (91). Patient was also able to perform oral care while sitting, with set-up. Patient left in bed with bed alarm set, call bell in reach, caregiver in room, and all needs met.    Recommendations for follow up therapy are one component of a multi-disciplinary discharge planning process, led by the attending physician.  Recommendations may be updated based on patient status, additional functional criteria and insurance authorization.    Follow Up Recommendations  Skilled nursing-short term rehab (<3 hours/day)    Assistance Recommended at Discharge Frequent or constant Supervision/Assistance  Patient can return home with the following  A lot of help with bathing/dressing/bathroom;Direct supervision/assist for medications management;Assistance with cooking/housework;Help with stairs or ramp for entrance;Direct supervision/assist for financial management;A lot of help with walking and/or transfers   Equipment Recommendations  Other (comment) (Defer to next venue of care.)    Recommendations for Other Services      Precautions / Restrictions Precautions Precautions: Fall Precaution Comments: orthostatic Restrictions Weight Bearing Restrictions: No       Mobility Bed Mobility Overal bed  mobility: Needs Assistance Bed Mobility: Supine to Sit, Sit to Supine     Supine to sit: Mod assist Sit to supine: Mod assist        Transfers Overall transfer level: Needs assistance Equipment used: Rolling walker (2 wheels) Transfers: Sit to/from Stand Sit to Stand: Mod assist                 Balance Overall balance assessment: Needs assistance, History of Falls Sitting-balance support: Single extremity supported, Feet supported Sitting balance-Leahy Scale: Fair     Standing balance support: Reliant on assistive device for balance, Bilateral upper extremity supported Standing balance-Leahy Scale: Poor                             ADL either performed or assessed with clinical judgement   ADL       Grooming: Oral care;Set up                                      Extremity/Trunk Assessment Upper Extremity Assessment Upper Extremity Assessment: Generalized weakness   Lower Extremity Assessment Lower Extremity Assessment: Generalized weakness        Vision Baseline Vision/History: 1 Wears glasses     Perception     Praxis      Cognition Arousal/Alertness: Awake/alert Behavior During Therapy: WFL for tasks assessed/performed Overall Cognitive Status: History of cognitive impairments - at baseline  Pertinent Vitals/ Pain       Pain Assessment Pain Assessment: No/denies pain   Frequency  Min 2X/week        Progress Toward Goals  OT Goals(current goals can now be found in the care plan section)  Progress towards OT goals: Progressing toward goals  Acute Rehab OT Goals Patient Stated Goal: none stated OT Goal Formulation: With patient Time For Goal Achievement: 07/23/22 Potential to Achieve Goals: [place] Discharge plan remains appropriate       AM-PAC OT "6 Clicks" Daily Activity     Outcome Measure     Help from another person  taking care of personal grooming?: A Little Help from another person toileting, which includes using toliet, bedpan, or urinal?: A Lot Help from another person bathing (including washing, rinsing, drying)?: A Lot Help from another person to put on and taking off regular upper body clothing?: A Little Help from another person to put on and taking off regular lower body clothing?: A Lot 6 Click Score: 12    End of Session Equipment Utilized During Treatment: Rolling walker (2 wheels)  OT Visit Diagnosis: Unsteadiness on feet (R26.81);Muscle weakness (generalized) (M62.81);History of falling (Z91.81);Other symptoms and signs involving cognitive function   Activity Tolerance Treatment limited secondary to medical complications (Comment)   Patient Left in bed;with call bell/phone within reach;with bed alarm set   Nurse Communication Mobility status        Time: 2035-5974 OT Time Calculation (min): 26 min  Charges: OT General Charges $OT Visit: 1 Visit OT Treatments $Self Care/Home Management : 8-22 mins $Therapeutic Activity: 8-22 mins    Tzippy Testerman, OTS 07/12/2022, 4:29 PM

## 2022-07-13 DIAGNOSIS — U071 COVID-19: Secondary | ICD-10-CM | POA: Diagnosis not present

## 2022-07-13 DIAGNOSIS — R410 Disorientation, unspecified: Secondary | ICD-10-CM | POA: Diagnosis not present

## 2022-07-13 DIAGNOSIS — I951 Orthostatic hypotension: Secondary | ICD-10-CM | POA: Diagnosis not present

## 2022-07-13 DIAGNOSIS — N3000 Acute cystitis without hematuria: Secondary | ICD-10-CM | POA: Diagnosis not present

## 2022-07-13 MED ORDER — ADULT MULTIVITAMIN W/MINERALS CH
1.0000 | ORAL_TABLET | Freq: Every day | ORAL | Status: DC
  Administered 2022-07-13 – 2022-07-21 (×9): 1 via ORAL
  Filled 2022-07-13 (×8): qty 1

## 2022-07-13 MED ORDER — SODIUM CHLORIDE 0.9 % IV BOLUS
250.0000 mL | Freq: Once | INTRAVENOUS | Status: AC
  Administered 2022-07-13: 250 mL via INTRAVENOUS

## 2022-07-13 NOTE — Hospital Course (Addendum)
88 year old female with past medical history of arthritis, hypertension, hypothyroidism, dementia, CKD stage IIIb.  She presented to the hospital with altered mental status and delirium.  She was found to have COVID-19 virus infection and Klebsiella pneumoniae UTI.  She was prescribed a 5-day course of Paxlovid and initially on IV Rocephin and then switched over to p.o. Keflex.  The patient became orthostatic with working with physical therapy.  Fluid boluses have been given.  Her beta-blocker was discontinued. Patient is still waiting for nursing placement, quarantine is over.  Patient accepted to nursing home tomorrow.

## 2022-07-13 NOTE — TOC Progression Note (Signed)
Transition of Care [facility]) - Progression Note    Patient Details  Name: Kerri [name] MRN: [id number] Date of Birth: 1931-02-02  [facility] of [facility]) CM/SW Contact  Laurena Slimmer, RN Phone Number: 07/13/2022, 2:51 PM  Clinical Narrative:    Call placed to patient's daughter to advised of bed offers. Bed offers.Family would prefer [facility] and would like for patient to be transferred by EMS. Advised of COVID quarantine period. Family stated they were already aware.    Expected Discharge Plan: [place] Barriers to Discharge: Continued Medical Work up  Expected Discharge Plan and Services Expected Discharge Plan: [place] arrangements for the past 2 months: Single Family Home                                       Social Determinants of Health (SDOH) Interventions    Readmission Risk Interventions     No data to display

## 2022-07-13 NOTE — Progress Notes (Signed)
Physical Therapy Treatment Patient Details Name: Kerri [name] MRN: [id number] DOB: January 31, 1931 Today's Date: 07/13/2022   History of Present Illness Kerri [name] is an 69yoF who comes to [facility] on 07/08/22 2/2 confusion. PMH: leadless PPM, arthritis, UTI, dementia, normal pressure hydrocephalus. Workup reveals (+) COVID test and UTI.    PT Comments    Pt in bed ready to get up for lunch.  BP supine 136/70 P 62. To EOB with mod a x 1 BPT 120/90  66.  Some dizziness noted.  She stood at bedside with mod a x 1 with post lean on bed supporting her to remain upright today.  Unable to take any steps forward or sideways.  Attempted to take BP in standing but she is unable to remain upright for full reading and self initiates sitting during reading.  BP eventually read after 2 tries 143/71 P 62.  She does endorse dizziness limiting standing but unable to complete full orthostatic vitals today.  She does transfer to chair at bedside with stand pivot +1 mod assist after unable to transfer with walker today.  Overall she does seem generally weaker than Sunday with more lean and more assist.  Remained in chair with needs met and eating lunch.  Tech aware of recommendation for +2 assist back to bed.   Recommendations for follow up therapy are one component of a multi-disciplinary discharge planning process, led by the attending physician.  Recommendations may be updated based on patient status, additional functional criteria and insurance authorization.  Follow Up Recommendations  Skilled nursing-short term rehab (<3 hours/day)     Assistance Recommended at Discharge Intermittent Supervision/Assistance  Patient can return home with the following Assistance with cooking/housework;Help with stairs or ramp for entrance;A lot of help with walking and/or transfers;A little help with bathing/dressing/bathroom;Assist for transportation   Equipment Recommendations  None recommended by PT    Recommendations for  Other Services       Precautions / Restrictions Precautions Precautions: Fall Precaution Comments: orthostatic Restrictions Weight Bearing Restrictions: No     Mobility  Bed Mobility Overal bed mobility: Needs Assistance Bed Mobility: Supine to Sit     Supine to sit: Mod assist          Transfers Overall transfer level: Needs assistance Equipment used: Rolling walker (2 wheels), None Transfers: Sit to/from Stand, Bed to chair/wheelchair/BSC Sit to Stand: Mod assist Stand pivot transfers: Mod assist         General transfer comment: seemed generally weaker today    Ambulation/Gait               General Gait Details: unable to take any sidesteps today,   Stairs             Wheelchair Mobility    Modified Rankin (Stroke Patients Only)       Balance Overall balance assessment: Needs assistance, History of Falls Sitting-balance support: Single extremity supported, Feet supported Sitting balance-Leahy Scale: Fair   Postural control: Right lateral lean, Posterior lean Standing balance support: Reliant on assistive device for balance, Bilateral upper extremity supported Standing balance-Leahy Scale: Poor Standing balance comment: heavier assist today to remain standing                            Cognition Arousal/Alertness: Awake/alert Behavior During Therapy: WFL for tasks assessed/performed Overall Cognitive Status: History of cognitive impairments - at baseline  Exercises      General Comments        Pertinent Vitals/Pain Pain Assessment Pain Assessment: No/denies pain    Home Living                          Prior Function            PT Goals (current goals can now be found in the care plan section) Progress towards PT goals: Progressing toward goals    Frequency    Min 2X/week      PT Plan Current plan remains appropriate     Co-evaluation              AM-PAC PT "6 Clicks" Mobility   Outcome Measure  Help needed turning from your back to your side while in a flat bed without using bedrails?: A Little Help needed moving from lying on your back to sitting on the side of a flat bed without using bedrails?: A Lot Help needed moving to and from a bed to a chair (including a wheelchair)?: A Lot Help needed standing up from a chair using your arms (e.g., wheelchair or bedside chair)?: A Lot Help needed to walk in hospital room?: A Lot Help needed climbing 3-5 steps with a railing? : Total 6 Click Score: 12    End of Session Equipment Utilized During Treatment: Gait belt Activity Tolerance: Patient tolerated treatment well;Patient limited by fatigue Patient left: in chair;with call bell/phone within reach;with chair alarm set Nurse Communication: Other (comment) PT Visit Diagnosis: Unsteadiness on feet (R26.81);Muscle weakness (generalized) (M62.81)     Time: 1610-9604 PT Time Calculation (min) (ACUTE ONLY): 24 min  Charges:  $Therapeutic Activity: 23-37 mins                   Chesley Noon, PTA 07/13/22, 12:29 PM

## 2022-07-13 NOTE — Plan of Care (Signed)

## 2022-07-13 NOTE — Progress Notes (Signed)
Progress Note   Patient: Kerri [name] [id number]:[id number] DOB: November 11, 1930 DOA: 07/07/2022     5 DOS: the patient was seen and examined on 07/13/2022   Brief hospital course: 88 year old female with past medical history of arthritis, hypertension, hypothyroidism, dementia, CKD stage IIIb.  She presented to the hospital with altered mental status and delirium.  She was found to have COVID-19 virus infection and Klebsiella pneumoniae UTI.  She was prescribed a 5-day course of Paxlovid and initially on IV Rocephin and then switched over to p.o. Keflex.  The patient became orthostatic with working with physical therapy.  Fluid boluses have been given.  Her beta-blocker was discontinued.  Assessment and Plan: * Orthostatic hypotension Beta-blocker discontinued on 07/11/2022.  Fluid bolus given on 07/11/2022, 07/12/2022 and 07/13/2022. Continue to monitor.   Acute delirium Resolved after 2 days.  This was secondary to COVID-19 infection and UTI.  COVID-19 virus infection Covid isolation.  Paxlovid (5-day course).   UTI (urinary tract infection) Klebsiella pneumoniae growing out of urine culture.  Was given Rocephin initially and now on p.o. Keflex.  Chronic kidney disease, stage 3b (HCC) Last creatinine 1.38 with a GFR of 37  Diarrhea Likely secondary to COVID-19 infection.  As needed Imodium if further diarrhea.  Malnutrition of moderate degree Continue supplements  Electrolyte abnormality Hypokalemia replaced  Essential hypertension Discontinued beta-blocker on 07/11/2022 with near syncopal episode with working with physical therapy.     Hypothyroidism Continue levothyroxine 50 mcg.    Dementia without behavioral disturbance (HCC) Continue Namenda, Aricept, Effexor.    Generalized weakness Continue PT and OT evaluations.  Currently recommending rehab.        Subjective: Patient states that she does not feel well but states that every day.  Still feeling weak.  No abdominal  pain or burning on urination.  No cough or shortness of breath.  Physical Exam: Vitals:   07/12/22 2104 07/13/22 0406 07/13/22 0434 07/13/22 0833  BP: (!) 141/55  109/63 (!) 140/75  Pulse: (!) 59  65 65  Resp: 18  18 18   Temp: 97.8 F (36.6 C)  98 F (36.7 C) 98.4 F (36.9 C)  TempSrc:      SpO2: 95%  93% 96%  Weight:  72.6 kg    Height:       Physical Exam HENT:     Head: Normocephalic.     Mouth/Throat:     Pharynx: No oropharyngeal exudate.  Eyes:     General: Lids are normal.  Cardiovascular:     Rate and Rhythm: Normal rate and regular rhythm.     Heart sounds: Normal heart sounds, S1 normal and S2 normal.  Pulmonary:     Breath sounds: No decreased breath sounds, wheezing, rhonchi or rales.  Abdominal:     Palpations: Abdomen is soft.     Tenderness: There is no abdominal tenderness.  Musculoskeletal:     Right lower leg: No swelling.     Left lower leg: No swelling.  Skin:    General: Skin is warm.     Findings: No rash.     Comments: And present bilateral lower extremity skin tear on the left lower extremity.  Neurological:     Mental Status: She is alert.     Comments: Answers some simple questions appropriately.  Not the best historian.  Able to straight leg raise.     Data Reviewed: Last creatinine 1.38  Family Communication: Spoke with patient's daughter on the phone  Disposition: Status is:  Inpatient Remains inpatient appropriate because: Still very weak.  Will have to be in the hospital with her COVID isolation prior to going out to rehab.  Planned Discharge Destination: Rehab    Time spent: 27 minutes  Author: Loletha Grayer, MD 07/13/2022 12:57 PM  For on call review [email or web address].

## 2022-07-14 DIAGNOSIS — R410 Disorientation, unspecified: Secondary | ICD-10-CM | POA: Diagnosis not present

## 2022-07-14 DIAGNOSIS — I951 Orthostatic hypotension: Secondary | ICD-10-CM | POA: Diagnosis not present

## 2022-07-14 DIAGNOSIS — U071 COVID-19: Secondary | ICD-10-CM | POA: Diagnosis not present

## 2022-07-14 DIAGNOSIS — N3000 Acute cystitis without hematuria: Secondary | ICD-10-CM | POA: Diagnosis not present

## 2022-07-14 LAB — CBC
HCT: 40.9 % (ref 36.0–46.0)
Hemoglobin: 13.3 g/dL (ref 12.0–15.0)
MCH: 28.3 pg (ref 26.0–34.0)
MCHC: 32.5 g/dL (ref 30.0–36.0)
MCV: 87 fL (ref 80.0–100.0)
Platelets: 203 10*3/uL (ref 150–400)
RBC: 4.7 MIL/uL (ref 3.87–5.11)
RDW: 13.1 % (ref 11.5–15.5)
WBC: 6.1 10*3/uL (ref 4.0–10.5)
nRBC: 0 % (ref 0.0–0.2)

## 2022-07-14 LAB — BASIC METABOLIC PANEL
Anion gap: 6 (ref 5–15)
BUN: 20 mg/dL (ref 8–23)
CO2: 26 mmol/L (ref 22–32)
Calcium: 9.4 mg/dL (ref 8.9–10.3)
Chloride: 109 mmol/L (ref 98–111)
Creatinine, Ser: 1.11 mg/dL — ABNORMAL HIGH (ref 0.44–1.00)
GFR, Estimated: 48 mL/min — ABNORMAL LOW (ref >60)
Glucose, Bld: 97 mg/dL (ref 70–99)
Potassium: 4 mmol/L (ref 3.5–5.1)
Sodium: 141 mmol/L (ref 135–145)

## 2022-07-14 MED ORDER — ENSURE ENLIVE PO LIQD
237.0000 mL | Freq: Three times a day (TID) | ORAL | Status: DC
  Administered 2022-07-14 – 2022-07-21 (×11): 237 mL via ORAL

## 2022-07-14 NOTE — Progress Notes (Signed)
  Progress Note   Patient: Kerri [name] IDP:[id number] DOB: 1930-11-21 DOA: 07/07/2022     6 DOS: the patient was seen and examined on 07/14/2022   Brief hospital course: 88 year old female with past medical history of arthritis, hypertension, hypothyroidism, dementia, CKD stage IIIb.  She presented to the hospital with altered mental status and delirium.  She was found to have COVID-19 virus infection and Klebsiella pneumoniae UTI.  She was prescribed a 5-day course of Paxlovid and initially on IV Rocephin and then switched over to p.o. Keflex.  The patient became orthostatic with working with physical therapy.  Fluid boluses have been given.  Her beta-blocker was discontinued.  Assessment and Plan: * Orthostatic hypotension Patient received a fluid bolus, beta-blocker was discontinued.  Condition has improved.  Acute delirium COVID-19 virus infection Diarrhea secondary to his COVID. Patient has completed Paxlovid, mental status improved.  Diarrhea improved.  Klebsiella UTI (urinary tract infection) Antibiotics completed.  Chronic kidney disease, stage 3b (HCC) Stable.  Malnutrition of moderate degree Continue supplements  Electrolyte abnormality Hypokalemia. Improved.  Essential hypertension Discontinued beta-blocker on 07/11/2022 with near syncopal episode with working with physical therapy.     Hypothyroidism Continue levothyroxine 50 mcg.    Dementia without behavioral disturbance (HCC) Continue Namenda, Aricept, Effexor.    Generalized weakness Continue PT and OT evaluations.  Pending SNF placement.       Subjective:  Patient doing well today, slept well last night.  No short of breath. No diarrhea today.  Physical Exam: Vitals:   07/13/22 1628 07/13/22 1937 07/14/22 0419 07/14/22 0741  BP: 122/68 (!) 157/75 118/65 118/66  Pulse: (!) 58 69 64 62  Resp: 18 18 17 18   Temp: 97.9 F (36.6 C) 98.2 F (36.8 C) 97.8 F (36.6 C) 98.1 F (36.7 C)  TempSrc:       SpO2: 96% 97% 95% 96%  Weight:      Height:       General exam: Appears calm and comfortable  Respiratory system: Clear to auscultation. Respiratory effort normal. Cardiovascular system: S1 & S2 heard, RRR. No JVD, murmurs, rubs, gallops or clicks. No pedal edema. Gastrointestinal system: Abdomen is nondistended, soft and nontender. No organomegaly or masses felt. Normal bowel sounds heard. Central nervous system: Alert and oriented x2. No focal neurological deficits. Extremities: Symmetric 5 x 5 power. Skin: No rashes, lesions or ulcers Psychiatry: Mood & affect appropriate.   Data Reviewed:  Lab results reviewed.  Family Communication:   Disposition: Status is: Inpatient Remains inpatient appropriate because: Unsafe discharge, pending nursing home placement.  Planned Discharge Destination: Skilled nursing facility    Time spent: 35 minutes  Author: Sharen Hones, MD 07/14/2022 12:12 PM  For on call review [email or web address].

## 2022-07-14 NOTE — Progress Notes (Signed)
Nutrition Follow-up  DOCUMENTATION CODES:   Non-severe (moderate) malnutrition in context of chronic illness  INTERVENTION:   -Ensure Enlive po TID, each supplement provides 350 kcal and 20 grams of protein -MVI with minerals daily -Liberalize diet to regular for wider variety of meal selections  NUTRITION DIAGNOSIS:   Moderate Malnutrition related to chronic illness (dementia) as evidenced by mild fat depletion, moderate fat depletion, mild muscle depletion, moderate muscle depletion.  Ongoing  GOAL:   Patient will meet greater than or equal to 90% of their needs  Progressing   MONITOR:   PO intake, Supplement acceptance  REASON FOR ASSESSMENT:   Malnutrition Screening Tool    ASSESSMENT:   Pt brought by family for confusion.  Patient per report gets confused when she has UTIs and granddaughter states that she thinks she has a UTI because patient has been confused no reports of fever, patient does have a mild cough.  Reviewed I/O's: -550 ml x 24 hours and -3.3 L since admission  UOP: 550 ml x 24 hours  Pt unavailable at time of visit. Attempted to speak with pt via call to hospital room phone, however, unable to reach.  Pt currently on a heart healthy diet. No meal completion data available to assess at this time.    Per chart review, pt's delirium has improved.   Per TOC notes, pt is agreeable to SNF, however, must stay in hospital until 07/17/22 secondary to COVID isolation.   Medications reviewed and include oscal with vitamin D.   Labs reviewed: CBGS: 132.   Diet Order:   Diet Order             Diet Heart Room service appropriate? Yes; Fluid consistency: Thin  Diet effective now                   EDUCATION NEEDS:   Education needs have been addressed  Skin:  Skin Assessment: Reviewed RN Assessment  Last BM:  Unknown  Height:   Ht Readings from Last 1 Encounters:  07/07/22 5\' 8"  (1.727 m)    Weight:   Wt Readings from Last 1  Encounters:  07/13/22 72.6 kg    Ideal Body Weight:  63.6 kg  BMI:  Body mass index is 24.34 kg/m.  Estimated Nutritional Needs:   Kcal:  1800-2000  Protein:  105-120 grams  Fluid:  > 1.8 L    Loistine Chance, RD, LDN, [place] Registered Dietitian II Certified Diabetes Care and Education Specialist Please refer to [facility] for RD and/or RD on-call/weekend/after hours pager

## 2022-07-14 NOTE — Plan of Care (Signed)

## 2022-07-15 ENCOUNTER — Ambulatory Visit: Payer: Medicare Other | Admitting: Urology

## 2022-07-15 ENCOUNTER — Ambulatory Visit: Payer: Medicare Other | Admitting: Physician Assistant

## 2022-07-15 DIAGNOSIS — N1832 Chronic kidney disease, stage 3b: Secondary | ICD-10-CM | POA: Diagnosis not present

## 2022-07-15 DIAGNOSIS — U071 COVID-19: Secondary | ICD-10-CM | POA: Diagnosis not present

## 2022-07-15 DIAGNOSIS — I951 Orthostatic hypotension: Secondary | ICD-10-CM | POA: Diagnosis not present

## 2022-07-15 MED ORDER — LACTULOSE 10 GM/15ML PO SOLN
20.0000 g | Freq: Once | ORAL | Status: AC
  Administered 2022-07-15: 20 g via ORAL
  Filled 2022-07-15: qty 30

## 2022-07-15 NOTE — Progress Notes (Addendum)
  Progress Note   Patient: Kerri [name] [id number]:[id number] DOB: 1931/05/28 DOA: 07/07/2022     7 DOS: the patient was seen and examined on 07/15/2022   Brief hospital course: 88 year old female with past medical history of arthritis, hypertension, hypothyroidism, dementia, CKD stage IIIb.  She presented to the hospital with altered mental status and delirium.  She was found to have COVID-19 virus infection and Klebsiella pneumoniae UTI.  She was prescribed a 5-day course of Paxlovid and initially on IV Rocephin and then switched over to p.o. Keflex.  The patient became orthostatic with working with physical therapy.  Fluid boluses have been given.  Her beta-blocker was discontinued.  Assessment and Plan: * Orthostatic hypotension Condition resolved.   Acute delirium COVID-19 virus infection Diarrhea secondary to his COVID. Patient has completed Paxlovid, mental status improved.  Diarrhea improved.   Klebsiella UTI (urinary tract infection) Antibiotics completed.   Chronic kidney disease, stage 3b (HCC) Stable.   Malnutrition of moderate degree Continue supplements   Electrolyte abnormality Hypokalemia. Improved.   Essential hypertension Near syncope. Discontinued beta-blocker on 07/11/2022 with near syncopal episode with working with physical therapy.       Hypothyroidism Continue levothyroxine 50 mcg.      Dementia without behavioral disturbance (HCC) Continue Namenda, Aricept, Effexor.      Generalized weakness Continue PT and OT evaluations.  Pending SNF placement.            Subjective:  Feel constipated, last bowel movement 3 days ago, lactulose given.  Physical Exam: Vitals:   07/14/22 2039 07/15/22 0447 07/15/22 0500 07/15/22 0841  BP: 124/66 126/64  114/71  Pulse: 61 64  64  Resp: 16 18  20   Temp: 98.9 F (37.2 C) 98.8 F (37.1 C)  98.6 F (37 C)  TempSrc:      SpO2: 93% 94%  93%  Weight:   75.5 kg   Height:       General exam: Appears calm  and comfortable  Respiratory system: Clear to auscultation. Respiratory effort normal. Cardiovascular system: S1 & S2 heard, RRR. No JVD, murmurs, rubs, gallops or clicks. No pedal edema. Gastrointestinal system: Abdomen is nondistended, soft and nontender. No organomegaly or masses felt. Normal bowel sounds heard. Central nervous system: Alert and oriented. No focal neurological deficits. Extremities: Symmetric 5 x 5 power. Skin: No rashes, lesions or ulcers Psychiatry: Judgement and insight appear normal. Mood & affect appropriate.   Data Reviewed:  There are no new results to review at this time.  Family Communication:   Disposition: Status is: Inpatient Remains inpatient appropriate because: Unsafe discharge.  Planned Discharge Destination: Skilled nursing facility    Time spent: 25 minutes  Author: Sharen Hones, MD 07/15/2022 2:04 PM  For on call review [email or web address].

## 2022-07-15 NOTE — Progress Notes (Signed)
Physical Therapy Treatment Patient Details Name: Kerri [name] MRN: [id number] DOB: 01/10/1931 Today's Date: 07/15/2022   History of Present Illness Kerri [name] is an 9yoF who comes to [facility] on 07/08/22 2/2 confusion. PMH: leadless PPM, arthritis, UTI, dementia, normal pressure hydrocephalus. Workup reveals (+) COVID test and UTI.    PT Comments    Patient received in recliner. She is agreeable to PT session. Patient requires mod A and max cues for sit to stand from recliner x 2 reps. Patient with limited standing tolerance due to weakness in LEs and poor balance. Cues needed to keep both hands on walker. She performed B LE strengthening with max cues and poor carry over. Patient will continue to benefit from skilled PT to improve strength and functional independence.     Recommendations for follow up therapy are one component of a multi-disciplinary discharge planning process, led by the attending physician.  Recommendations may be updated based on patient status, additional functional criteria and insurance authorization.  Follow Up Recommendations  Skilled nursing-short term rehab (<3 hours/day) Can patient physically be transported by private vehicle: No   Assistance Recommended at Discharge Frequent or constant Supervision/Assistance  Patient can return home with the following Assistance with cooking/housework;Help with stairs or ramp for entrance;A lot of help with walking and/or transfers;A little help with bathing/dressing/bathroom;Assist for transportation   Equipment Recommendations  None recommended by PT    Recommendations for Other Services       Precautions / Restrictions Precautions Precautions: Fall Restrictions Weight Bearing Restrictions: No     Mobility  Bed Mobility               General bed mobility comments: NT-patient received in recliner    Transfers Overall transfer level: Needs assistance Equipment used: Rolling walker (2  wheels) Transfers: Sit to/from Stand Sit to Stand: Mod assist           General transfer comment: patient requires max cues to perform task effectively. Cues for hand placement on RW. Fatigues quickly in standing and demos poor balance in standing    Ambulation/Gait               General Gait Details: able to march in place, but poor tolerance and balance to attempt more gait.   Stairs             Wheelchair Mobility    Modified Rankin (Stroke Patients Only)       Balance Overall balance assessment: Needs assistance, History of Falls Sitting-balance support: Feet supported Sitting balance-Leahy Scale: Good     Standing balance support: Bilateral upper extremity supported, During functional activity, Reliant on assistive device for balance Standing balance-Leahy Scale: Poor Standing balance comment: posterior leaning in standing                            Cognition Arousal/Alertness: Awake/alert Behavior During Therapy: WFL for tasks assessed/performed Overall Cognitive Status: History of cognitive impairments - at baseline Area of Impairment: Memory, Safety/judgement, Awareness, Problem solving, Orientation                 Orientation Level: Disoriented to, Time, Situation, Place   Memory: Decreased short-term memory   Safety/Judgement: Decreased awareness of safety Awareness: Intellectual Problem Solving: Slow processing, Difficulty sequencing, Requires verbal cues, Requires tactile cues          Exercises Other Exercises Other Exercises: LAQ, AP, seated marching. x 10-15 reps each  General Comments        Pertinent Vitals/Pain Pain Assessment Pain Assessment: No/denies pain    Home Living                          Prior Function            PT Goals (current goals can now be found in the care plan section) Acute Rehab PT Goals PT Goal Formulation: Patient unable to participate in goal setting Progress  towards PT goals: Progressing toward goals    Frequency    Min 2X/week      PT Plan Current plan remains appropriate    Co-evaluation              AM-PAC PT "6 Clicks" Mobility   Outcome Measure  Help needed turning from your back to your side while in a flat bed without using bedrails?: A Little Help needed moving from lying on your back to sitting on the side of a flat bed without using bedrails?: A Lot Help needed moving to and from a bed to a chair (including a wheelchair)?: A Lot Help needed standing up from a chair using your arms (e.g., wheelchair or bedside chair)?: A Lot Help needed to walk in hospital room?: Total Help needed climbing 3-5 steps with a railing? : Total 6 Click Score: 11    End of Session Equipment Utilized During Treatment: Gait belt Activity Tolerance: Patient limited by fatigue Patient left: in chair;with call bell/phone within reach Nurse Communication: Mobility status PT Visit Diagnosis: Unsteadiness on feet (R26.81);Muscle weakness (generalized) (M62.81)     Time: [id number] PT Time Calculation (min) (ACUTE ONLY): 13 min  Charges:  $Therapeutic Exercise: 8-22 mins                     Jerrid Forgette, PT, GCS 07/15/22,11:20 AM

## 2022-07-15 NOTE — Progress Notes (Signed)
Occupational Therapy Treatment Patient Details Name: Kerri [name] MRN: [id number] DOB: 10-04-1931 Today's Date: 07/15/2022   History of present illness Kerri [name] is an 61yoF who comes to [facility] on 07/08/22 2/2 confusion. PMH: leadless PPM, arthritis, UTI, dementia, normal pressure hydrocephalus. Workup reveals (+) COVID test and UTI.   OT comments  Patient in recliner upon arrival, agreeable to OT services; caregiver present.  Patient's recliner blankets soiled in urine. Patient transferred to [facility] requiring mod A +2 for safety and manual facilitation. Barrier cream applied due to redness on buttocks; successful BM, RN notified. Patient had difficulty following commands and motor planning to take steps. Max A for peri-care and LB dressing. Patient transferred back to recliner with mod A +2. Patient left in recliner with chair alarm set, call bell in reach, and caregiver in room.    Recommendations for follow up therapy are one component of a multi-disciplinary discharge planning process, led by the attending physician.  Recommendations may be updated based on patient status, additional functional criteria and insurance authorization.    Follow Up Recommendations  Skilled nursing-short term rehab (<3 hours/day)    Assistance Recommended at Discharge Frequent or constant Supervision/Assistance  Patient can return home with the following  A lot of help with bathing/dressing/bathroom;Direct supervision/assist for medications management;Assistance with cooking/housework;Help with stairs or ramp for entrance;Direct supervision/assist for financial management;A lot of help with walking and/or transfers   Equipment Recommendations  Other (comment) (Defer to next venue of care.)    Recommendations for Other Services      Precautions / Restrictions Precautions Precautions: Fall Precaution Comments: orthostatic Restrictions Weight Bearing Restrictions: No       Mobility Bed Mobility                General bed mobility comments: Patient in recliner upon arrival.    Transfers Overall transfer level: Needs assistance Equipment used: Rolling walker (2 wheels) Transfers: Sit to/from Stand Sit to Stand: Mod assist, +2 safety/equipment Stand pivot transfers: Mod assist, +2 safety/equipment         General transfer comment: patient requires max cues to perform task effectively. Cues for hand placement on RW. Fatigues quickly in standing and demos poor balance in standing     Balance Overall balance assessment: Needs assistance, History of Falls Sitting-balance support: Feet supported Sitting balance-Leahy Scale: Good     Standing balance support: Bilateral upper extremity supported, During functional activity, Reliant on assistive device for balance Standing balance-Leahy Scale: Poor Standing balance comment: posterior leaning in standing                           ADL either performed or assessed with clinical judgement   ADL                           Toilet Transfer: +2 for safety/equipment;Moderate assistance   Toileting- Clothing Manipulation and Hygiene: Maximal assistance       Functional mobility during ADLs: Moderate assistance;+2 for safety/equipment;Rolling walker (2 wheels)      Extremity/Trunk Assessment Upper Extremity Assessment Upper Extremity Assessment: Generalized weakness   Lower Extremity Assessment Lower Extremity Assessment: Generalized weakness        Vision Baseline Vision/History: 1 Wears glasses            Cognition Arousal/Alertness: Awake/alert Behavior During Therapy: WFL for tasks assessed/performed Overall Cognitive Status: History of cognitive impairments - at baseline  Pertinent Vitals/ Pain       Pain Assessment Pain Assessment: No/denies pain   Frequency  Min 2X/week        Progress Toward Goals  OT  Goals(current goals can now be found in the care plan section)  Progress towards OT goals: Progressing toward goals  Acute Rehab OT Goals Patient Stated Goal: none stated OT Goal Formulation: With patient Time For Goal Achievement: 07/23/22 Potential to Achieve Goals: Fair  Plan Discharge plan remains appropriate       AM-PAC OT "6 Clicks" Daily Activity     Outcome Measure   Help from another person eating meals?: A Little Help from another person taking care of personal grooming?: A Lot Help from another person toileting, which includes using toliet, bedpan, or urinal?: A Lot Help from another person bathing (including washing, rinsing, drying)?: A Lot Help from another person to put on and taking off regular upper body clothing?: A Little Help from another person to put on and taking off regular lower body clothing?: A Lot 6 Click Score: 14    End of Session Equipment Utilized During Treatment: Rolling walker (2 wheels)  OT Visit Diagnosis: Unsteadiness on feet (R26.81);Muscle weakness (generalized) (M62.81);History of falling (Z91.81);Other symptoms and signs involving cognitive function   Activity Tolerance Patient tolerated treatment well   Patient Left in chair;with call bell/phone within reach;with chair alarm set;with family/visitor present   Nurse Communication Mobility status        Time: 6834-1962 OT Time Calculation (min): 32 min  Charges: OT General Charges $OT Visit: 1 Visit OT Treatments $Self Care/Home Management : 23-37 mins    Kerri [name], OTS 07/15/2022, 4:32 PM

## 2022-07-16 DIAGNOSIS — U071 COVID-19: Secondary | ICD-10-CM | POA: Diagnosis not present

## 2022-07-16 DIAGNOSIS — R410 Disorientation, unspecified: Secondary | ICD-10-CM | POA: Diagnosis not present

## 2022-07-16 DIAGNOSIS — I951 Orthostatic hypotension: Secondary | ICD-10-CM | POA: Diagnosis not present

## 2022-07-16 NOTE — Progress Notes (Signed)
Physical Therapy Treatment Patient Details Name: Kerri [name] MRN: [id number] DOB: [birth year] Today's Date: 07/16/2022   History of Present Illness Kerri [name] is an 75yoF who comes to [facility] on 07/08/22 2/2 confusion. PMH: leadless PPM, arthritis, UTI, dementia, normal pressure hydrocephalus. Workup reveals (+) COVID test and UTI.    PT Comments    Pt received up in chair, pleasantly confused yet able to follow simple commands. Pt required ModA to stand from chair and gain balance at RW. Gait training in room x 46ft with RW, MinA for balance and management of RW, vc's for safety awareness and directional cues. Pt O2 sats97% on RA during exertion, no c/o SOB or dizziness affecting mobility. Overall progressing well. Continue PT per POC.    Recommendations for follow up therapy are one component of a multi-disciplinary discharge planning process, led by the attending physician.  Recommendations may be updated based on patient status, additional functional criteria and insurance authorization.  Follow Up Recommendations  Skilled nursing-short term rehab (<3 hours/day) Can patient physically be transported by private vehicle: No   Assistance Recommended at Discharge Frequent or constant Supervision/Assistance  Patient can return home with the following Assistance with cooking/housework;Help with stairs or ramp for entrance;A lot of help with walking and/or transfers;A little help with bathing/dressing/bathroom;Assist for transportation   Equipment Recommendations  None recommended by PT    Recommendations for Other Services       Precautions / Restrictions Precautions Precautions: Fall Precaution Comments: orthostatic Restrictions Weight Bearing Restrictions: No     Mobility  Bed Mobility Overal bed mobility: Needs Assistance Bed Mobility: Supine to Sit Rolling: Min assist   Supine to sit: Mod assist Sit to supine: Mod assist   General bed mobility comments: Patient in  recliner upon arrival.    Transfers Overall transfer level: Needs assistance Equipment used: Rolling walker (2 wheels) Transfers: Sit to/from Stand Sit to Stand: Mod assist, +2 safety/equipment           General transfer comment: patient requires max cues to perform task effectively. Cues for hand placement on RW. Fatigues quickly in standing and demos poor balance in standing    Ambulation/Gait Ambulation/Gait assistance: Min assist Gait Distance (Feet): 20 Feet Assistive device: Rolling walker (2 wheels) Gait Pattern/deviations: Step-to pattern, Wide base of support, Trunk flexed, Drifts right/left Gait velocity: decreased     General Gait Details: Repeated vc's to correct upright posture and stay inside assistive device. No c/o dizziness   Stairs             Wheelchair Mobility    Modified Rankin (Stroke Patients Only)       Balance Overall balance assessment: Needs assistance, History of Falls Sitting-balance support: Feet supported Sitting balance-Leahy Scale: Good     Standing balance support: Bilateral upper extremity supported, During functional activity, Reliant on assistive device for balance Standing balance-Leahy Scale: Poor                              Cognition Arousal/Alertness: Awake/alert Behavior During Therapy: WFL for tasks assessed/performed Overall Cognitive Status: History of cognitive impairments - at baseline Area of Impairment: Memory, Safety/judgement, Awareness, Problem solving, Orientation                 Orientation Level: Disoriented to, Time, Situation, Place   Memory: Decreased short-term memory   Safety/Judgement: Decreased awareness of safety Awareness: Intellectual Problem Solving: Slow processing, Difficulty sequencing, Requires verbal cues,  Requires tactile cues          Exercises General Exercises - Lower Extremity Ankle Circles/Pumps: AROM, Both, 10 reps, Seated Long Arc Quad: AROM,  Both, 10 reps, Seated Hip Flexion/Marching: AROM, Both, 10 reps, Seated    General Comments        Pertinent Vitals/Pain Pain Assessment Pain Assessment: No/denies pain    Home Living                          Prior Function            PT Goals (current goals can now be found in the care plan section) Acute Rehab PT Goals Patient Stated Goal:  (go home)    Frequency    Min 2X/week      PT Plan Current plan remains appropriate    Co-evaluation              AM-PAC PT "6 Clicks" Mobility   Outcome Measure  Help needed turning from your back to your side while in a flat bed without using bedrails?: A Little Help needed moving from lying on your back to sitting on the side of a flat bed without using bedrails?: A Lot Help needed moving to and from a bed to a chair (including a wheelchair)?: A Lot Help needed standing up from a chair using your arms (e.g., wheelchair or bedside chair)?: A Lot Help needed to walk in hospital room?: Total Help needed climbing 3-5 steps with a railing? : Total 6 Click Score: 11    End of Session Equipment Utilized During Treatment: Gait belt Activity Tolerance: Patient limited by fatigue Patient left: in bed;with call bell/phone within reach;with bed alarm set Nurse Communication: Mobility status PT Visit Diagnosis: Unsteadiness on feet (R26.81);Muscle weakness (generalized) (M62.81)     Time: 2706-2376 PT Time Calculation (min) (ACUTE ONLY): 27 min  Charges:  $Gait Training: 8-22 mins $Therapeutic Exercise: 8-22 mins                    Kerri [name], PTA    Kerri [name] 07/16/2022, 4:34 PM

## 2022-07-16 NOTE — Progress Notes (Signed)
  Progress Note   Patient: Kerri [name] [id number]:[id number] DOB: [birth year] DOA: 07/07/2022     8 DOS: the patient was seen and examined on 07/16/2022   Brief hospital course: 88 year old female with past medical history of arthritis, hypertension, hypothyroidism, dementia, CKD stage IIIb.  She presented to the hospital with altered mental status and delirium.  She was found to have COVID-19 virus infection and Klebsiella pneumoniae UTI.  She was prescribed a 5-day course of Paxlovid and initially on IV Rocephin and then switched over to p.o. Keflex.  The patient became orthostatic with working with physical therapy.  Fluid boluses have been given.  Her beta-blocker was discontinued.  Assessment and Plan: * Orthostatic hypotension Blood pressure has been better.   Acute delirium COVID-19 virus infection Diarrhea secondary to his COVID. Patient has completed Paxlovid, mental status improved.  Diarrhea improved. Patient still asymptomatic.  No additional agitation or confusion.  Klebsiella UTI (urinary tract infection) Antibiotics completed.   Chronic kidney disease, stage 3b (HCC) Stable.   Malnutrition of moderate degree Continue supplements   Electrolyte abnormality Hypokalemia. Improved.   Essential hypertension Near syncope. Discontinued beta-blocker on 07/11/2022 with near syncopal episode with working with physical therapy.       Hypothyroidism Continue levothyroxine 50 mcg.       Subjective:  Patient has been sleeping well, eating well.  Physical Exam: Vitals:   07/15/22 1954 07/16/22 0439 07/16/22 0449 07/16/22 0829  BP: 118/85 134/69  131/66  Pulse: 78 61  65  Resp: 18 18  16   Temp: 98 F (36.7 C) 97.8 F (36.6 C)  97.6 F (36.4 C)  TempSrc: Oral     SpO2: 93% 93%  96%  Weight:   76.1 kg   Height:       General exam: Appears calm and comfortable  Respiratory system: Clear to auscultation. Respiratory effort normal. Cardiovascular system: S1 & S2 heard,  RRR. No JVD, murmurs, rubs, gallops or clicks. No pedal edema. Gastrointestinal system: Abdomen is nondistended, soft and nontender. No organomegaly or masses felt. Normal bowel sounds heard. Central nervous system: Alert and oriented. No focal neurological deficits. Extremities: Symmetric 5 x 5 power. Skin: No rashes, lesions or ulcers Psychiatry:  Mood & affect appropriate.   Data Reviewed:  There are no new results to review at this time.  Family Communication: Caretaker at the bedside.  Disposition: Status is: Inpatient Remains inpatient appropriate because: Unsafe discharge.  Planned Discharge Destination: Skilled nursing facility    Time spent: 29 minutes  Author: Sharen Hones, MD 07/16/2022 1:44 PM  For on call review [email or web address].

## 2022-07-17 DIAGNOSIS — R410 Disorientation, unspecified: Secondary | ICD-10-CM | POA: Diagnosis not present

## 2022-07-17 DIAGNOSIS — I951 Orthostatic hypotension: Secondary | ICD-10-CM | POA: Diagnosis not present

## 2022-07-17 DIAGNOSIS — U071 COVID-19: Secondary | ICD-10-CM | POA: Diagnosis not present

## 2022-07-17 NOTE — Progress Notes (Signed)
  Progress Note   Patient: Kerri [name] LXB:[id number] DOB: 1931/05/09 DOA: 07/07/2022     9 DOS: the patient was seen and examined on 07/17/2022   Brief hospital course: 88 year old female with past medical history of arthritis, hypertension, hypothyroidism, dementia, CKD stage IIIb.  She presented to the hospital with altered mental status and delirium.  She was found to have COVID-19 virus infection and Klebsiella pneumoniae UTI.  She was prescribed a 5-day course of Paxlovid and initially on IV Rocephin and then switched over to p.o. Keflex.  The patient became orthostatic with working with physical therapy.  Fluid boluses have been given.  Her beta-blocker was discontinued.  Assessment and Plan: * Orthostatic hypotension Resolved.   Acute delirium COVID-19 virus infection Diarrhea secondary to his COVID. Patient has completed Paxlovid, mental status improved.  Diarrhea improved. Patient still asymptomatic.  No additional agitation or confusion.   Klebsiella UTI (urinary tract infection) Antibiotics completed.   Chronic kidney disease, stage 3b (HCC) Stable.   Malnutrition of moderate degree Continue supplements   Electrolyte abnormality Hypokalemia. Improved.   Essential hypertension Near syncope. Discontinued beta-blocker on 07/11/2022 with near syncopal episode with working with physical therapy.       Hypothyroidism Continue levothyroxine 50 mcg.       Subjective:  Patient doing much better, good appetite with no nausea vomiting.  Physical Exam: Vitals:   07/16/22 0449 07/16/22 0829 07/16/22 1940 07/17/22 0856  BP:  131/66 (!) 129/59 118/78  Pulse:  65 66 62  Resp:  16 18 16   Temp:  97.6 F (36.4 C) 98 F (36.7 C) 97.6 F (36.4 C)  TempSrc:      SpO2:  96% 93% 94%  Weight: 76.1 kg     Height:       General exam: Appears calm and comfortable  Respiratory system: Clear to auscultation. Respiratory effort normal. Cardiovascular system: S1 & S2 heard,  RRR. No JVD, murmurs, rubs, gallops or clicks. No pedal edema. Gastrointestinal system: Abdomen is nondistended, soft and nontender. No organomegaly or masses felt. Normal bowel sounds heard. Central nervous system: Alert and oriented x2. No focal neurological deficits. Extremities: Symmetric 5 x 5 power. Skin: No rashes, lesions or ulcers Psychiatry: Mood & affect appropriate.   Data Reviewed:  There are no new results to review at this time.  Family Communication: Caretaker at bedside.  Disposition: Status is: Inpatient Remains inpatient appropriate because: Unsafe discharge.  Planned Discharge Destination: Skilled nursing facility    Time spent: 19 minutes  Author: Sharen Hones, MD 07/17/2022 11:30 AM  For on call review [email or web address].

## 2022-07-18 DIAGNOSIS — R410 Disorientation, unspecified: Secondary | ICD-10-CM | POA: Diagnosis not present

## 2022-07-18 DIAGNOSIS — U071 COVID-19: Secondary | ICD-10-CM | POA: Diagnosis not present

## 2022-07-18 DIAGNOSIS — I951 Orthostatic hypotension: Secondary | ICD-10-CM | POA: Diagnosis not present

## 2022-07-18 MED ORDER — LACTULOSE 10 GM/15ML PO SOLN
20.0000 g | Freq: Once | ORAL | Status: AC
  Administered 2022-07-18: 20 g via ORAL
  Filled 2022-07-18: qty 30

## 2022-07-18 NOTE — TOC Progression Note (Signed)
Transition of Care [facility]) - Progression Note    Patient Details  Name: Kerri [name] MRN: [id number] Date of Birth: June 07, 1931  Transition of Care [facility]) CM/SW Contact  Izola Price, RN Phone Number: 07/18/2022, 2:39 PM  Clinical Narrative: 10/8: Authorization started in [employer] portal.  Auth ID# [id number]. AC at [employer] of pending auth/discharge. Simmie Davies RN CM       Expected Discharge Plan: Skilled Nursing Facility Barriers to Discharge: Continued Medical Work up  Expected Discharge Plan and Services Expected Discharge Plan: [place] arrangements for the past 2 months: Single Family Home                                       Social Determinants of Health (SDOH) Interventions    Readmission Risk Interventions     No data to display

## 2022-07-18 NOTE — Progress Notes (Signed)
  Progress Note   Patient: Kerri [name]:[id number] DOB: 10-28-1930 DOA: 07/07/2022     10 DOS: the patient was seen and examined on 07/18/2022   Brief hospital course: 88 year old female with past medical history of arthritis, hypertension, hypothyroidism, dementia, CKD stage IIIb.  She presented to the hospital with altered mental status and delirium.  She was found to have COVID-19 virus infection and Klebsiella pneumoniae UTI.  She was prescribed a 5-day course of Paxlovid and initially on IV Rocephin and then switched over to p.o. Keflex.  The patient became orthostatic with working with physical therapy.  Fluid boluses have been given.  Her beta-blocker was discontinued.  Assessment and Plan: * Orthostatic hypotension Acute delirium COVID-19 virus infection Diarrhea secondary to his COVID. Patient has completed Paxlovid, mental status improved.  Diarrhea improved. No new issues, completed 10 days of quarantine.  Likely transfer to nursing home tomorrow.   Klebsiella UTI (urinary tract infection) Antibiotics completed.   Chronic kidney disease, stage 3b (HCC) Stable.   Malnutrition of moderate degree Continue supplements   Electrolyte abnormality Hypokalemia. Improved.   Essential hypertension Near syncope. Discontinued beta-blocker on 07/11/2022 with near syncopal episode with working with physical therapy.       Hypothyroidism Continue levothyroxine 50 mcg.         Subjective:  Patient doing well, slept well, good appetite.  Last documented bowel movement was 10/4, will give a dose of lactulose.  Physical Exam: Vitals:   07/17/22 1656 07/17/22 2021 07/18/22 0500 07/18/22 0820  BP: (!) 137/55 (!) 115/58  123/66  Pulse: 61 90  (!) 59  Resp: 16 18  16   Temp: 97.8 F (36.6 C) 99.1 F (37.3 C)  98 F (36.7 C)  TempSrc:    Oral  SpO2: 97% 95%  96%  Weight:   75.6 kg   Height:       General exam: Appears calm and comfortable  Respiratory system: Clear to  auscultation. Respiratory effort normal. Cardiovascular system: S1 & S2 heard, RRR. No JVD, murmurs, rubs, gallops or clicks. No pedal edema. Gastrointestinal system: Abdomen is nondistended, soft and nontender. No organomegaly or masses felt. Normal bowel sounds heard. Central nervous system: Alert and oriented x2. No focal neurological deficits. Extremities: Symmetric 5 x 5 power. Skin: No rashes, lesions or ulcers Psychiatry: Mood & affect appropriate.   Data Reviewed:  There are no new results to review at this time.  Family Communication: None  Disposition: Status is: Inpatient Remains inpatient appropriate because: Unsafe discharge.  Planned Discharge Destination: Skilled nursing facility    Time spent: 29 minutes  Author: Sharen Hones, MD 07/18/2022 9:23 AM  For on call review [email or web address].

## 2022-07-19 DIAGNOSIS — N3000 Acute cystitis without hematuria: Secondary | ICD-10-CM | POA: Diagnosis not present

## 2022-07-19 DIAGNOSIS — U071 COVID-19: Secondary | ICD-10-CM | POA: Diagnosis not present

## 2022-07-19 DIAGNOSIS — I951 Orthostatic hypotension: Secondary | ICD-10-CM | POA: Diagnosis not present

## 2022-07-19 NOTE — Discharge Summary (Signed)
Physician Discharge Summary   Patient: Kerri [name] MRN: [id number] DOB: [birth year]  Admit date:     07/07/2022  Discharge date: 07/19/22  Discharge Physician: Sharen Hones   PCP: Donnamarie Rossetti, PA-C   Recommendations at discharge:   Follow-up with PCP in 1 week.  Discharge Diagnoses: Principal Problem:   Orthostatic hypotension Active Problems:   Acute delirium   UTI (urinary tract infection)   COVID-19 virus infection   Chronic kidney disease, stage 3b (HCC)   Generalized weakness   Dementia without behavioral disturbance (HCC)   Hypothyroidism   Essential hypertension   Electrolyte abnormality   Malnutrition of moderate degree   Diarrhea  Resolved Problems:   * No resolved hospital problems. *  Hospital Course: 88 year old female with past medical history of arthritis, hypertension, hypothyroidism, dementia, CKD stage IIIb.  She presented to the hospital with altered mental status and delirium.  She was found to have COVID-19 virus infection and Klebsiella pneumoniae UTI.  She was prescribed a 5-day course of Paxlovid and initially on IV Rocephin and then switched over to p.o. Keflex.  The patient became orthostatic with working with physical therapy.  Fluid boluses have been given.  Her beta-blocker was discontinued.  Assessment and Plan: * Orthostatic hypotension Acute delirium COVID-19 virus infection Diarrhea secondary to his COVID. Patient has completed Paxlovid, mental status improved.  Diarrhea improved. No new issues, completed 10 days of quarantine.   Discharged to nursing home today.   Klebsiella UTI (urinary tract infection) Antibiotics completed.   Chronic kidney disease, stage 3b (HCC) Stable.   Malnutrition of moderate degree Continue supplements   Electrolyte abnormality Hypokalemia. Improved.   Essential hypertension Near syncope. Discontinued beta-blocker on 07/11/2022 with near syncopal episode with working with physical  therapy.       Hypothyroidism Continue levothyroxine 50 mcg.        Consultants: None Procedures performed: None  Disposition: Skilled nursing facility Diet recommendation:  Discharge Diet Orders (From admission, onward)     Start     Ordered   07/19/22 0000  Diet - low sodium heart healthy        07/19/22 0920           Cardiac diet DISCHARGE MEDICATION: Allergies as of 07/19/2022       Reactions   Alendronate Sodium Other (See Comments)   Bupropion Other (See Comments)   Tremors    Rofecoxib Diarrhea   Alendronate Rash        Medication List     STOP taking these medications    metoprolol succinate 25 MG 24 hr tablet Commonly known as: TOPROL-XL   nitrofurantoin (macrocrystal-monohydrate) 100 MG capsule Commonly known as: MACROBID   nitrofurantoin 50 MG capsule Commonly known as: MACRODANTIN       TAKE these medications    acetaminophen 325 MG tablet Commonly known as: TYLENOL Take 2 tablets (650 mg total) by mouth every 6 (six) hours as needed for mild pain, moderate pain or fever.   CALCIUM-CARB 600 + D PO Take 1 tablet by mouth daily.   chlorhexidine 4 % external liquid Commonly known as: Hibiclens Apply topically daily as needed.   cyanocobalamin 1000 MCG/ML injection Commonly known as: VITAMIN B12 Inject 1,000 mcg into the muscle every 30 (thirty) days.   donepezil 10 MG tablet Commonly known as: ARICEPT Take 10 mg by mouth at bedtime.   estradiol 0.1 MG/GM vaginal cream Commonly known as: ESTRACE Estrogen Cream Instruction Discard applicator Apply pea sized  amount to tip of finger to urethra before bed. Wash hands well after application. Use Monday, Wednesday and Friday   fluticasone 50 MCG/ACT nasal spray Commonly known as: FLONASE 2 sprays in both nostrils   folic acid 1 MG tablet Commonly known as: FOLVITE Take 1 mg by mouth daily.   levothyroxine 50 MCG tablet Commonly known as: SYNTHROID Take 50 mcg by mouth daily  before breakfast.   memantine 10 MG tablet Commonly known as: NAMENDA Take 10 mg by mouth 2 (two) times daily.   mirabegron ER 25 MG Tb24 tablet Commonly known as: MYRBETRIQ Take 1 tablet (25 mg total) by mouth daily.   polyethylene glycol powder 17 GM/SCOOP powder Commonly known as: GLYCOLAX/MIRALAX Take 17 g by mouth daily.   tolterodine 4 MG 24 hr capsule Commonly known as: DETROL LA Take 4 mg by mouth daily.   venlafaxine XR 75 MG 24 hr capsule Commonly known as: EFFEXOR-XR Take 75 mg by mouth daily with breakfast.   Vitamin D (Ergocalciferol) 1.25 MG (50000 UNIT) Caps capsule Commonly known as: DRISDOL Take 50,000 Units by mouth every 7 (seven) days.        Contact information for after-discharge care     [facility] Preferred [facility]F .   Service: Skilled Nursing Contact information: [place] [phone]                    Discharge Exam: Filed Weights   07/16/22 0449 07/18/22 0500 07/19/22 0500  Weight: 76.1 kg 75.6 kg 75.9 kg   General exam: Appears calm and comfortable  Respiratory system: Clear to auscultation. Respiratory effort normal. Cardiovascular system: S1 & S2 heard, RRR. No JVD, murmurs, rubs, gallops or clicks. No pedal edema. Gastrointestinal system: Abdomen is nondistended, soft and nontender. No organomegaly or masses felt. Normal bowel sounds heard. Central nervous system: Alert and oriented x3. No focal neurological deficits. Extremities: Symmetric 5 x 5 power. Skin: No rashes, lesions or ulcers Psychiatry: Judgement and insight appear normal. Mood & affect appropriate.    Condition at discharge: good  The results of significant diagnostics from this hospitalization (including imaging, microbiology, ancillary and laboratory) are listed below for reference.   Imaging Studies: CT Head Wo  Contrast  Result Date: 07/07/2022 CLINICAL DATA:  Mental status change; history of normal pressure hydrocephalus EXAM: CT HEAD WITHOUT CONTRAST TECHNIQUE: Contiguous axial images were obtained from the base of the skull through the vertex without intravenous contrast. RADIATION DOSE REDUCTION: This exam was performed according to the departmental dose-optimization program which includes automated exposure control, adjustment of the mA and/or kV according to patient size and/or use of iterative reconstruction technique. COMPARISON:  CT head 01/29/2022 FINDINGS: Brain: No intracranial hemorrhage, mass effect, or evidence of acute infarct. No hydrocephalus. No extra-axial fluid collection. Generalized cerebral atrophy with marked ex vacuo dilatation of the ventricles. Ill-defined hypoattenuation within the cerebral white matter is nonspecific and could be due to chronic small vessel ischemic disease and/or transependymal flow of CSF in the setting of normal pressure hydrocephalus. Chronic bilateral cerebellar infarcts. Vascular: No hyperdense vessel. Intracranial arterial calcification. Skull: No fracture or focal lesion. Sinuses/Orbits: No acute finding. Paranasal sinuses and mastoid air cells are well aerated. Other: None. IMPRESSION: No acute intracranial abnormality. Extensive small-vessel ischemic change and/or transependymal flow of CSF from pressure hydrocephalus. The appearance is unchanged from 01/29/2022. Electronically Signed   By:  Placido Sou M.D.   On: 07/07/2022 20:32   DG Chest 2 View  Result Date: 07/07/2022 CLINICAL DATA:  Shortness of breath EXAM: CHEST - 2 VIEW COMPARISON:  Chest x-ray 01/29/2022 FINDINGS: Abandoned pacemaker leads are unchanged in position. The heart size and mediastinal contours are within normal limits. Both lungs are clear. The visualized skeletal structures are unremarkable. IMPRESSION: No active cardiopulmonary disease. Electronically Signed   By: Ronney Asters M.D.    On: 07/07/2022 16:51    Microbiology: Results for orders placed or performed during the hospital encounter of 07/07/22  Resp Panel by RT-PCR (Flu A&B, Covid) Anterior Nasal Swab     Status: Abnormal   Collection Time: 07/07/22  1:19 PM   Specimen: Anterior Nasal Swab  Result Value Ref Range Status   SARS Coronavirus 2 by RT PCR POSITIVE (A) NEGATIVE Final    Comment: (NOTE) SARS-CoV-2 target nucleic acids are DETECTED.  The SARS-CoV-2 RNA is generally detectable in upper respiratory specimens during the acute phase of infection. Positive results are indicative of the presence of the identified virus, but do not rule out bacterial infection or co-infection with other pathogens not detected by the test. Clinical correlation with patient history and other diagnostic information is necessary to determine patient infection status. The expected result is Negative.  Fact Sheet for Patients: EntrepreneurPulse.com.au  Fact Sheet for Healthcare Providers: IncredibleEmployment.be  This test is not yet approved or cleared by the [place] FDA and  has been authorized for detection and/or diagnosis of SARS-CoV-2 by FDA under an Emergency Use Authorization (EUA).  This EUA will remain in effect (meaning this test can be used) for the duration of  the COVID-19 declaration under Section 564(b)(1) of the A ct, 21 U.S.C. section 360bbb-3(b)(1), unless the authorization is terminated or revoked sooner.     Influenza A by PCR NEGATIVE NEGATIVE Final   Influenza B by PCR NEGATIVE NEGATIVE Final    Comment: (NOTE) The Xpert Xpress SARS-CoV-2/FLU/RSV plus assay is intended as an aid in the diagnosis of influenza from Nasopharyngeal swab specimens and should not be used as a sole basis for treatment. Nasal washings and aspirates are unacceptable for Xpert Xpress SARS-CoV-2/FLU/RSV testing.  Fact Sheet for  Patients: EntrepreneurPulse.com.au  Fact Sheet for Healthcare Providers: IncredibleEmployment.be  This test is not yet approved or cleared by the [place] FDA and has been authorized for detection and/or diagnosis of SARS-CoV-2 by FDA under an Emergency Use Authorization (EUA). This EUA will remain in effect (meaning this test can be used) for the duration of the COVID-19 declaration under Section 564(b)(1) of the Act, 21 U.S.C. section 360bbb-3(b)(1), unless the authorization is terminated or revoked.  Performed at [facility], [address], [place], [place] [address]   Urine Culture     Status: Abnormal   Collection Time: 07/07/22  5:05 PM   Specimen: Urine, Clean Catch  Result Value Ref Range Status   Specimen Description   Final    URINE, CLEAN CATCH Performed at [facility], [address], [place], [place] [address]    Special Requests   Final    Normal Performed at [facility], [address] [place], [place] [address]    Culture >=100,000 COLONIES/mL KLEBSIELLA PNEUMONIAE (A)  Final   Report Status 07/10/2022 FINAL  Final   Organism ID, Bacteria KLEBSIELLA PNEUMONIAE (A)  Final      Susceptibility   Klebsiella pneumoniae - MIC*    AMPICILLIN >=32 RESISTANT Resistant  CEFAZOLIN <=4 SENSITIVE Sensitive     CEFEPIME <=0.12 SENSITIVE Sensitive     CEFTRIAXONE <=0.25 SENSITIVE Sensitive     CIPROFLOXACIN 0.5 INTERMEDIATE Intermediate     GENTAMICIN <=1 SENSITIVE Sensitive     IMIPENEM <=0.25 SENSITIVE Sensitive     NITROFURANTOIN 256 RESISTANT Resistant     TRIMETH/SULFA <=20 SENSITIVE Sensitive     AMPICILLIN/SULBACTAM 16 INTERMEDIATE Intermediate     PIP/TAZO 16 SENSITIVE Sensitive     * >=100,000 COLONIES/mL KLEBSIELLA PNEUMONIAE  SARS Coronavirus 2 by RT PCR (hospital order, performed in [facility] hospital lab) *cepheid single result test* Anterior Nasal Swab     Status: Abnormal    Collection Time: 07/07/22  5:30 PM   Specimen: Anterior Nasal Swab  Result Value Ref Range Status   SARS Coronavirus 2 by RT PCR POSITIVE (A) NEGATIVE Final    Comment: (NOTE) SARS-CoV-2 target nucleic acids are DETECTED  SARS-CoV-2 RNA is generally detectable in upper respiratory specimens  during the acute phase of infection.  Positive results are indicative  of the presence of the identified virus, but do not rule out bacterial infection or co-infection with other pathogens not detected by the test.  Clinical correlation with patient history and  other diagnostic information is necessary to determine patient infection status.  The expected result is negative.  Fact Sheet for Patients:   https://www.patel.info/   Fact Sheet for Healthcare Providers:   https://hall.com/    This test is not yet approved or cleared by the [place] FDA and  has been authorized for detection and/or diagnosis of SARS-CoV-2 by FDA under an Emergency Use Authorization (EUA).  This EUA will remain in effect (meaning this test can be used) for the duration of  the COVID-19 declaration under Section 564(b)(1)  of the Act, 21 U.S.C. section 360-bbb-3(b)(1), unless the authorization is terminated or revoked sooner.   Performed at [facility], [address] [place], [place] [address]     Labs: CBC: Recent Labs  Lab 07/14/22 0433  WBC 6.1  HGB 13.3  HCT 40.9  MCV 87.0  PLT 123456   Basic Metabolic Panel: Recent Labs  Lab 07/14/22 0433  NA 141  K 4.0  CL 109  CO2 26  GLUCOSE 97  BUN 20  CREATININE 1.11*  CALCIUM 9.4   Liver Function Tests: No results for input(s): "AST", "ALT", "ALKPHOS", "BILITOT", "PROT", "ALBUMIN" in the last 168 hours. CBG: No results for input(s): "GLUCAP" in the last 168 hours.  Discharge time spent: greater than 30 minutes.  Signed: Sharen Hones, MD [facility] 07/19/2022

## 2022-07-19 NOTE — Plan of Care (Signed)
  Problem: Respiratory: Goal: Will maintain a patent airway Outcome: Progressing Goal: Complications related to the disease process, condition or treatment will be avoided or minimized Outcome: Progressing   Problem: Respiratory: Goal: Complications related to the disease process, condition or treatment will be avoided or minimized Outcome: Progressing   Problem: Education: Goal: Knowledge of General Education information will improve Description: Including pain rating scale, medication(s)/side effects and non-pharmacologic comfort measures Outcome: Progressing

## 2022-07-19 NOTE — Progress Notes (Signed)
Spoke with Kerri [name] in admissions at [facility]. [facility] portal checked. Kerri [name] is still pending. Kerri [name] stated discharge summary and auth must be received by 3pm for admission to facility today.  2:25pm [facility] portal checked. Kerri [name] is pending. Spoke with [name], patient's daughter. She was notified patient's admission not likely today due to needed auth by 3pm.

## 2022-07-19 NOTE — Care Management Important Message (Signed)
Important Message  Patient Details  Name: Kerri [name] MRN: [id number] Date of Birth: 01/29/1931   Medicare Important Message Given:  Yes     Juliann Pulse A Sicilia Killough 07/19/2022, 12:08 PM

## 2022-07-19 NOTE — Plan of Care (Signed)

## 2022-07-20 NOTE — Progress Notes (Signed)
  Progress Note   Patient: Kerri [name] VQM:[id number] DOB: 22-Apr-1931 DOA: 07/07/2022     12 DOS: the patient was seen and examined on 07/20/2022   Brief hospital course: 88 year old female with past medical history of arthritis, hypertension, hypothyroidism, dementia, CKD stage IIIb.  She presented to the hospital with altered mental status and delirium.  She was found to have COVID-19 virus infection and Klebsiella pneumoniae UTI.  She was prescribed a 5-day course of Paxlovid and initially on IV Rocephin and then switched over to p.o. Keflex.  The patient became orthostatic with working with physical therapy.  Fluid boluses have been given.  Her beta-blocker was discontinued. Patient is still waiting for nursing placement, quarantine is over.  Patient accepted to nursing home tomorrow.  Assessment and Plan: * Orthostatic hypotension Acute delirium COVID-19 virus infection Diarrhea secondary to his COVID. Patient has completed Paxlovid, mental status improved.  Diarrhea improved. No new issues, completed 10 days of quarantine.   Insurance finally approved for nursing placement, accepted tomorrow.   Klebsiella UTI (urinary tract infection) Antibiotics completed.   Chronic kidney disease, stage 3b (HCC) Stable.   Malnutrition of moderate degree Continue supplements   Electrolyte abnormality Hypokalemia. Improved.   Essential hypertension Near syncope. Discontinued beta-blocker on 07/11/2022 with near syncopal episode with working with physical therapy.       Hypothyroidism Continue levothyroxine 50 mcg.          Subjective:  Patient doing well, no complaints.  Physical Exam: Vitals:   07/20/22 0415 07/20/22 0425 07/20/22 0828 07/20/22 1528  BP:  134/77 (!) 141/78 104/67  Pulse:  65 61 75  Resp:  18 18 18   Temp:  98.3 F (36.8 C) (!) 97.4 F (36.3 C) 98.7 F (37.1 C)  TempSrc:      SpO2:  92% 93% 94%  Weight: 76.7 kg     Height:       General exam:  Appears calm and comfortable  Respiratory system: Clear to auscultation. Respiratory effort normal. Cardiovascular system: S1 & S2 heard, RRR. No JVD, murmurs, rubs, gallops or clicks. No pedal edema. Gastrointestinal system: Abdomen is nondistended, soft and nontender. No organomegaly or masses felt. Normal bowel sounds heard. Central nervous system: Alert and oriented. No focal neurological deficits. Extremities: Symmetric 5 x 5 power. Skin: No rashes, lesions or ulcers Psychiatry: Judgement and insight appear normal. Mood & affect appropriate.   Data Reviewed:  There are no new results to review at this time.  Family Communication:   Disposition: Status is: Inpatient Remains inpatient appropriate because: Unsafe discharge.  Planned Discharge Destination: Skilled nursing facility    Time spent: 18 minutes  Author: Sharen Hones, MD 07/20/2022 3:34 PM  For on call review [email or web address].

## 2022-07-20 NOTE — Progress Notes (Addendum)
Retrieved call from [facility] portal. Representative is requesting updated therapy notes, H&P and MD progress note.    MD and assigned PT notified.   1:15pm  Requested H&P, and updated therapy notes to reflect last 48hours upload to [facility] portal.

## 2022-07-20 NOTE — Progress Notes (Signed)
Physical Therapy Treatment Patient Details Name: Kerri [name] MRN: [id number] DOB: [birth year] Today's Date: 07/20/2022   History of Present Illness Kerri [name] is an 89yoF who comes to [facility] on 07/08/22 2/2 confusion. PMH: leadless PPM, arthritis, UTI, dementia, normal pressure hydrocephalus. Workup reveals (+) COVID test and UTI.    PT Comments    Pt seen this am for PT services, received in bedside recliner, agreed to mobility. Pt completed B LE AROM prior to standing from chair with ModA to initially to rise. Pt completed 27ft of gait training with RW, CGA, and repeated vc's for upright posture, staying within AD, and maintaining focus on task. Initial c/o dizziness when asked which ended up resolving. Pt has made physical progress since last session and will benefit from short term stay at SNF prior to returning home.    Recommendations for follow up therapy are one component of a multi-disciplinary discharge planning process, led by the attending physician.  Recommendations may be updated based on patient status, additional functional criteria and insurance authorization.  Follow Up Recommendations  Skilled nursing-short term rehab (<3 hours/day) Can patient physically be transported by private vehicle: No   Assistance Recommended at Discharge Frequent or constant Supervision/Assistance  Patient can return home with the following Assistance with cooking/housework;Help with stairs or ramp for entrance;A lot of help with walking and/or transfers;A little help with bathing/dressing/bathroom;Assist for transportation   Equipment Recommendations  None recommended by PT    Recommendations for Other Services       Precautions / Restrictions Precautions Precautions: Fall Precaution Comments: orthostatic Restrictions Weight Bearing Restrictions: No     Mobility  Bed Mobility Overal bed mobility: Needs Assistance Bed Mobility: Supine to Sit Rolling: Min assist   Supine to  sit: Mod assist Sit to supine: Mod assist   General bed mobility comments: Patient in recliner upon arrival.    Transfers Overall transfer level: Needs assistance Equipment used: Rolling walker (2 wheels) Transfers: Sit to/from Stand Sit to Stand: Mod assist           General transfer comment: Pt needs assist for initial rise from chair    Ambulation/Gait Ambulation/Gait assistance: Min guard Gait Distance (Feet): 75 Feet Assistive device: Rolling walker (2 wheels) Gait Pattern/deviations: Step-to pattern, Wide base of support, Trunk flexed, Drifts right/left Gait velocity: decreased     General Gait Details: Repeated vc's to correct upright posture and stay inside assistive device. Pt tends to reach out to grab rail/furniture   Stairs             Wheelchair Mobility    Modified Rankin (Stroke Patients Only)       Balance Overall balance assessment: Needs assistance, History of Falls Sitting-balance support: Feet supported Sitting balance-Leahy Scale: Good     Standing balance support: Bilateral upper extremity supported, During functional activity, Reliant on assistive device for balance Standing balance-Leahy Scale: Fair                              Cognition Arousal/Alertness: Awake/alert Behavior During Therapy: WFL for tasks assessed/performed Overall Cognitive Status: History of cognitive impairments - at baseline Area of Impairment: Memory, Safety/judgement, Awareness, Problem solving, Orientation                 Orientation Level: Disoriented to, Time, Situation, Place   Memory: Decreased short-term memory   Safety/Judgement: Decreased awareness of safety Awareness: Intellectual Problem Solving: Slow processing, Difficulty sequencing, Requires  verbal cues, Requires tactile cues General Comments: Very pleasant and sweet!        Exercises General Exercises - Lower Extremity Ankle Circles/Pumps: AROM, Both, 10 reps,  Seated Long Arc Quad: AROM, Both, 10 reps, Seated Hip Flexion/Marching: AROM, Both, 10 reps, Seated    General Comments General comments (skin integrity, edema, etc.):  (Purewick removed for gait training)      Pertinent Vitals/Pain Pain Assessment Pain Assessment: No/denies pain    Home Living                          Prior Function            PT Goals (current goals can now be found in the care plan section) Acute Rehab PT Goals Patient Stated Goal: To get back home Progress towards PT goals: Progressing toward goals    Frequency    Min 2X/week      PT Plan Current plan remains appropriate    Co-evaluation              AM-PAC PT "6 Clicks" Mobility   Outcome Measure  Help needed turning from your back to your side while in a flat bed without using bedrails?: A Little Help needed moving from lying on your back to sitting on the side of a flat bed without using bedrails?: A Lot Help needed moving to and from a bed to a chair (including a wheelchair)?: A Lot Help needed standing up from a chair using your arms (e.g., wheelchair or bedside chair)?: A Lot Help needed to walk in hospital room?: A Little Help needed climbing 3-5 steps with a railing? : Total 6 Click Score: 13    End of Session Equipment Utilized During Treatment: Gait belt Activity Tolerance: Patient tolerated treatment well Patient left: in chair;with call bell/phone within reach;with chair alarm set Nurse Communication: Mobility status PT Visit Diagnosis: Unsteadiness on feet (R26.81);Muscle weakness (generalized) (M62.81)     Time: 6283-1517 PT Time Calculation (min) (ACUTE ONLY): 31 min  Charges:  $Gait Training: 8-22 mins $Therapeutic Exercise: 8-22 mins                    Kerri [name], PTA    Kerri [name] 07/20/2022, 11:30 AM

## 2022-07-20 NOTE — TOC Progression Note (Signed)
Transition of Care [facility]) - Progression Note    Patient Details  Name: Kerri [name] MRN: [id number] Date of Birth: November 07, 1930  [facility]) CM/SW Contact  Laurena Slimmer, RN Phone Number: 07/20/2022, 10:06 AM  Clinical Narrative:    [employer] portal check. Authorization still pending.    Expected Discharge Plan: [place] Barriers to Discharge: Continued Medical Work up  Expected Discharge Plan and Services Expected Discharge Plan: [place] arrangements for the past 2 months: Single Family Home Expected Discharge Date: 07/19/22                                     Social Determinants of Health (SDOH) Interventions    Readmission Risk Interventions     No data to display

## 2022-07-20 NOTE — Plan of Care (Signed)
  Problem: Education: Goal: Knowledge of risk factors and measures for prevention of condition will improve Outcome: Progressing   Problem: Coping: Goal: Psychosocial and spiritual needs will be supported Outcome: Progressing   Problem: Respiratory: Goal: Will maintain a patent airway Outcome: Progressing   

## 2022-07-21 MED ORDER — GUAIFENESIN ER 600 MG PO TB12
600.0000 mg | ORAL_TABLET | Freq: Two times a day (BID) | ORAL | Status: DC
  Administered 2022-07-21: 600 mg via ORAL
  Filled 2022-07-21: qty 1

## 2022-07-21 MED ORDER — NYSTATIN 100000 UNIT/GM EX POWD: CUTANEOUS | 0 refills | Status: DC

## 2022-07-21 MED ORDER — ALBUTEROL SULFATE HFA 108 (90 BASE) MCG/ACT IN AERS: 2.0000 | INHALATION_SPRAY | Freq: Four times a day (QID) | RESPIRATORY_TRACT | 0 refills | Status: DC

## 2022-07-21 MED ORDER — GUAIFENESIN ER 600 MG PO TB12: 600.0000 mg | ORAL_TABLET | Freq: Two times a day (BID) | ORAL | 0 refills | Status: AC

## 2022-07-21 NOTE — TOC Progression Note (Signed)
Transition of Care [facility]) - Progression Note    Patient Details  Name: Kerri [name] MRN: [id number] Date of Birth: 1931/02/03  Transition of Care [facility]) CM/SW Contact  Laurena Slimmer, RN Phone Number: 07/21/2022, 9:48 AM  Clinical Narrative:    Patient approved for SNF at [facility]. Facility notified. Patient can be accepted today. She has been assigned room #506. MD, and Nurse notified.    Expected Discharge Plan: [place] Barriers to Discharge: Continued Medical Work up  Expected Discharge Plan and Services Expected Discharge Plan: [place] arrangements for the past 2 months: Single Family Home Expected Discharge Date: 07/19/22                                     Social Determinants of Health (SDOH) Interventions    Readmission Risk Interventions     No data to display

## 2022-07-21 NOTE — Discharge Summary (Signed)
Physician Discharge Summary   Patient: Kerri [name] MRN: [id number] DOB: [birth year]  Admit date:     07/07/2022  Discharge date: 07/21/22  Discharge Physician: Loletha Grayer   PCP: Donnamarie Rossetti, PA-C   Recommendations at discharge:   Follow-up team at rehab 1 day  Discharge Diagnoses: Principal Problem:   Orthostatic hypotension Active Problems:   Acute delirium   UTI (urinary tract infection)   COVID-19 virus infection   Chronic kidney disease, stage 3b (HCC)   Generalized weakness   Dementia without behavioral disturbance (HCC)   Hypothyroidism   Essential hypertension   Electrolyte abnormality   Malnutrition of moderate degree   Diarrhea    Hospital Course: 88 year old female with past medical history of arthritis, hypertension, hypothyroidism, dementia, CKD stage IIIb.  She presented to the hospital with altered mental status and delirium.  She was found to have COVID-19 virus infection and Klebsiella pneumoniae UTI.  She was prescribed a 5-day course of Paxlovid and initially on IV Rocephin and then switched over to p.o. Keflex.  The patient became orthostatic with working with physical therapy.  Fluid boluses have been given.  Her beta-blocker was discontinued. Patient completed quarantine for COVID-19 infection while here in the hospital.  Patient will go out to rehab.  Assessment and Plan: * Orthostatic hypotension Beta-blocker discontinued on 07/11/2022.  Fluid bolus given on 07/11/2022, 07/12/2022 and 07/13/2022.   Acute delirium Resolved after 2 days.  This was secondary to COVID-19 infection and UTI.  COVID-19 virus infection Completed 5-day course of Paxlovid.  Completed isolation course here in the hospital.   UTI (urinary tract infection) Completed antibiotic course while here.  Chronic kidney disease, stage 3b (HCC) Last creatinine 1.11 with a GFR 48.  Diarrhea In the beginning of the hospital course secondary to COVID-19  infection.  Malnutrition of moderate degree Continue supplements  Electrolyte abnormality Hypokalemia replaced  Essential hypertension Discontinued beta-blocker on 07/11/2022 with near syncopal episode with working with physical therapy.     Hypothyroidism Continue levothyroxine 50 mcg.    Dementia without behavioral disturbance (HCC) Continue Namenda, Aricept, Effexor.    Generalized weakness Patient will go out to rehab today.         Consultants: None Procedures performed: None Disposition: Rehabilitation facility Diet recommendation:  Discharge Diet Orders (From admission, onward)     Start     Ordered   07/19/22 0000  Diet - low sodium heart healthy        07/19/22 0920           Regular diet DISCHARGE MEDICATION: Allergies as of 07/21/2022       Reactions   Alendronate Sodium Other (See Comments)   Bupropion Other (See Comments)   Tremors    Rofecoxib Diarrhea   Alendronate Rash        Medication List     STOP taking these medications    metoprolol succinate 25 MG 24 hr tablet Commonly known as: TOPROL-XL   nitrofurantoin (macrocrystal-monohydrate) 100 MG capsule Commonly known as: MACROBID   tolterodine 4 MG 24 hr capsule Commonly known as: DETROL LA       TAKE these medications    acetaminophen 325 MG tablet Commonly known as: TYLENOL Take 2 tablets (650 mg total) by mouth every 6 (six) hours as needed for mild pain, moderate pain or fever.   albuterol 108 (90 Base) MCG/ACT inhaler Commonly known as: VENTOLIN HFA Inhale 2 puffs into the lungs every 6 (six) hours.   CALCIUM-CARB  600 + D PO Take 1 tablet by mouth daily.   chlorhexidine 4 % external liquid Commonly known as: Hibiclens Apply topically daily as needed.   cyanocobalamin 1000 MCG/ML injection Commonly known as: VITAMIN B12 Inject 1,000 mcg into the muscle every 30 (thirty) days.   donepezil 10 MG tablet Commonly known as: ARICEPT Take 10 mg by mouth at  bedtime.   estradiol 0.1 MG/GM vaginal cream Commonly known as: ESTRACE Estrogen Cream Instruction Discard applicator Apply pea sized amount to tip of finger to urethra before bed. Wash hands well after application. Use Monday, Wednesday and Friday   fluticasone 50 MCG/ACT nasal spray Commonly known as: FLONASE 2 sprays in both nostrils   folic acid 1 MG tablet Commonly known as: FOLVITE Take 1 mg by mouth daily.   guaiFENesin 600 MG 12 hr tablet Commonly known as: MUCINEX Take 1 tablet (600 mg total) by mouth 2 (two) times daily for 5 days.   levothyroxine 50 MCG tablet Commonly known as: SYNTHROID Take 50 mcg by mouth daily before breakfast.   memantine 10 MG tablet Commonly known as: NAMENDA Take 10 mg by mouth 2 (two) times daily.   mirabegron ER 25 MG Tb24 tablet Commonly known as: MYRBETRIQ Take 1 tablet (25 mg total) by mouth daily.   nitrofurantoin 50 MG capsule Commonly known as: MACRODANTIN TAKE 1 CAPSULE BY MOUTH ONCE DAILY   nystatin powder Commonly known as: MYCOSTATIN/NYSTOP Twice a day under skin folds that a red   polyethylene glycol powder 17 GM/SCOOP powder Commonly known as: GLYCOLAX/MIRALAX Take 17 g by mouth daily.   venlafaxine XR 75 MG 24 hr capsule Commonly known as: EFFEXOR-XR Take 75 mg by mouth daily with breakfast.   Vitamin D (Ergocalciferol) 1.25 MG (50000 UNIT) Caps capsule Commonly known as: DRISDOL Take 50,000 Units by mouth every 7 (seven) days.        Contact information for after-discharge care     [facility] Preferred SNF .   Service: Skilled Nursing Contact information: [place] [phone]                    Discharge Exam: Danley Danker Weights   07/19/22 0500 07/20/22 0415 07/21/22 0110  Weight: 75.9 kg 76.7 kg 77.1 kg   Physical Exam HENT:     Head: Normocephalic.      Mouth/Throat:     Pharynx: No oropharyngeal exudate.  Eyes:     General: Lids are normal.  Cardiovascular:     Rate and Rhythm: Normal rate and regular rhythm.     Heart sounds: Normal heart sounds, S1 normal and S2 normal.  Pulmonary:     Breath sounds: Examination of the right-lower field reveals decreased breath sounds. Examination of the left-lower field reveals decreased breath sounds. Decreased breath sounds present. No wheezing, rhonchi or rales.  Abdominal:     Palpations: Abdomen is soft.     Tenderness: There is no abdominal tenderness.  Musculoskeletal:     Right lower leg: No swelling.     Left lower leg: No swelling.  Skin:    General: Skin is warm.     Findings: No rash.     Comments: And present bilateral lower extremity skin tear on the left lower extremity.  Neurological:     Mental Status: She is alert.     Comments: Answers some simple questions.  Condition at discharge: stable  The results of significant diagnostics from this hospitalization (including imaging, microbiology, ancillary and laboratory) are listed below for reference.   Imaging Studies: CT Head Wo Contrast  Result Date: 07/07/2022 CLINICAL DATA:  Mental status change; history of normal pressure hydrocephalus EXAM: CT HEAD WITHOUT CONTRAST TECHNIQUE: Contiguous axial images were obtained from the base of the skull through the vertex without intravenous contrast. RADIATION DOSE REDUCTION: This exam was performed according to the departmental dose-optimization program which includes automated exposure control, adjustment of the mA and/or kV according to patient size and/or use of iterative reconstruction technique. COMPARISON:  CT head 01/29/2022 FINDINGS: Brain: No intracranial hemorrhage, mass effect, or evidence of acute infarct. No hydrocephalus. No extra-axial fluid collection. Generalized cerebral atrophy with marked ex vacuo dilatation of the ventricles. Ill-defined hypoattenuation within the  cerebral white matter is nonspecific and could be due to chronic small vessel ischemic disease and/or transependymal flow of CSF in the setting of normal pressure hydrocephalus. Chronic bilateral cerebellar infarcts. Vascular: No hyperdense vessel. Intracranial arterial calcification. Skull: No fracture or focal lesion. Sinuses/Orbits: No acute finding. Paranasal sinuses and mastoid air cells are well aerated. Other: None. IMPRESSION: No acute intracranial abnormality. Extensive small-vessel ischemic change and/or transependymal flow of CSF from pressure hydrocephalus. The appearance is unchanged from 01/29/2022. Electronically Signed   By: Placido Sou M.D.   On: 07/07/2022 20:32   DG Chest 2 View  Result Date: 07/07/2022 CLINICAL DATA:  Shortness of breath EXAM: CHEST - 2 VIEW COMPARISON:  Chest x-ray 01/29/2022 FINDINGS: Abandoned pacemaker leads are unchanged in position. The heart size and mediastinal contours are within normal limits. Both lungs are clear. The visualized skeletal structures are unremarkable. IMPRESSION: No active cardiopulmonary disease. Electronically Signed   By: Ronney Asters M.D.   On: 07/07/2022 16:51    Microbiology: Results for orders placed or performed during the hospital encounter of 07/07/22  Resp Panel by RT-PCR (Flu A&B, Covid) Anterior Nasal Swab     Status: Abnormal   Collection Time: 07/07/22  1:19 PM   Specimen: Anterior Nasal Swab  Result Value Ref Range Status   SARS Coronavirus 2 by RT PCR POSITIVE (A) NEGATIVE Final    Comment: (NOTE) SARS-CoV-2 target nucleic acids are DETECTED.  The SARS-CoV-2 RNA is generally detectable in upper respiratory specimens during the acute phase of infection. Positive results are indicative of the presence of the identified virus, but do not rule out bacterial infection or co-infection with other pathogens not detected by the test. Clinical correlation with patient history and other diagnostic information is necessary  to determine patient infection status. The expected result is Negative.  Fact Sheet for Patients: EntrepreneurPulse.com.au  Fact Sheet for Healthcare Providers: IncredibleEmployment.be  This test is not yet approved or cleared by the [place] FDA and  has been authorized for detection and/or diagnosis of SARS-CoV-2 by FDA under an Emergency Use Authorization (EUA).  This EUA will remain in effect (meaning this test can be used) for the duration of  the COVID-19 declaration under Section 564(b)(1) of the A ct, 21 U.S.C. section 360bbb-3(b)(1), unless the authorization is terminated or revoked sooner.     Influenza A by PCR NEGATIVE NEGATIVE Final   Influenza B by PCR NEGATIVE NEGATIVE Final    Comment: (NOTE) The Xpert Xpress SARS-CoV-2/FLU/RSV plus assay is intended as an aid in the diagnosis of influenza from Nasopharyngeal swab specimens and should not be used as a sole basis for treatment. Nasal washings and  aspirates are unacceptable for Xpert Xpress SARS-CoV-2/FLU/RSV testing.  Fact Sheet for Patients: EntrepreneurPulse.com.au  Fact Sheet for Healthcare Providers: IncredibleEmployment.be  This test is not yet approved or cleared by the [place] FDA and has been authorized for detection and/or diagnosis of SARS-CoV-2 by FDA under an Emergency Use Authorization (EUA). This EUA will remain in effect (meaning this test can be used) for the duration of the COVID-19 declaration under Section 564(b)(1) of the Act, 21 U.S.C. section 360bbb-3(b)(1), unless the authorization is terminated or revoked.  Performed at [facility], [address] [place], [place] [address]   Urine Culture     Status: Abnormal   Collection Time: 07/07/22  5:05 PM   Specimen: Urine, Clean Catch  Result Value Ref Range Status   Specimen Description   Final    URINE, CLEAN CATCH Performed at [facility], [address] [place], [place] [address]    Special Requests   Final    Normal Performed at [facility], [place], [place] [address]    Culture >=100,000 COLONIES/mL KLEBSIELLA PNEUMONIAE (A)  Final   Report Status 07/10/2022 FINAL  Final   Organism ID, Bacteria KLEBSIELLA PNEUMONIAE (A)  Final      Susceptibility   Klebsiella pneumoniae - MIC*    AMPICILLIN >=32 RESISTANT Resistant     CEFAZOLIN <=4 SENSITIVE Sensitive     CEFEPIME <=0.12 SENSITIVE Sensitive     CEFTRIAXONE <=0.25 SENSITIVE Sensitive     CIPROFLOXACIN 0.5 INTERMEDIATE Intermediate     GENTAMICIN <=1 SENSITIVE Sensitive     IMIPENEM <=0.25 SENSITIVE Sensitive     NITROFURANTOIN 256 RESISTANT Resistant     TRIMETH/SULFA <=20 SENSITIVE Sensitive     AMPICILLIN/SULBACTAM 16 INTERMEDIATE Intermediate     PIP/TAZO 16 SENSITIVE Sensitive     * >=100,000 COLONIES/mL KLEBSIELLA PNEUMONIAE  SARS Coronavirus 2 by RT PCR (hospital order, performed in [place] hospital lab) *cepheid single result test* Anterior Nasal Swab     Status: Abnormal   Collection Time: 07/07/22  5:30 PM   Specimen: Anterior Nasal Swab  Result Value Ref Range Status   SARS Coronavirus 2 by RT PCR POSITIVE (A) NEGATIVE Final    Comment: (NOTE) SARS-CoV-2 target nucleic acids are DETECTED  SARS-CoV-2 RNA is generally detectable in upper respiratory specimens  during the acute phase of infection.  Positive results are indicative  of the presence of the identified virus, but do not rule out bacterial infection or co-infection with other pathogens not detected by the test.  Clinical correlation with patient history and  other diagnostic information is necessary to determine patient infection status.  The expected result is negative.  Fact Sheet for Patients:   https://www.patel.info/   Fact Sheet for Healthcare Providers:   https://hall.com/    This test is not  yet approved or cleared by the [place] FDA and  has been authorized for detection and/or diagnosis of SARS-CoV-2 by FDA under an Emergency Use Authorization (EUA).  This EUA will remain in effect (meaning this test can be used) for the duration of  the COVID-19 declaration under Section 564(b)(1)  of the Act, 21 U.S.C. section 360-bbb-3(b)(1), unless the authorization is terminated or revoked sooner.   Performed at [facility], [address] [place], [place] [address]       Discharge time spent: greater than 30 minutes.  Signed: Loletha Grayer, MD [facility] 07/21/2022

## 2022-07-21 NOTE — TOC Transition Note (Signed)
Transition of Care [facility]) - CM/SW Discharge Note   Patient Details  Name: Kerri [name] MRN: [id number] Date of Birth: 01-15-1931  [facility] CM/SW Contact:  Laurena Slimmer, RN Phone Number: 07/21/2022, 10:47 AM   Clinical Narrative:    Spoke with patient's daughter Kerri Piety to advise of discharge to [facility].  Spoke with Magda Paganini at [facility] to confirm discharge summary was received. Nurse given room number 510 and number to call report [phone]. EMS arrangedf, EMS packet arranged TOC signing off    Final next level of care: [place] Barriers to Discharge: Barriers Resolved   Patient Goals and CMS Choice Patient states their goals for this hospitalization and ongoing recovery are:: To return home CMS Medicare.gov Compare Post Acute Care list provided to:: Patient Represenative (must comment) Choice offered to / list presented to : Adult Children  Discharge Placement                Patient to be transferred to facility by: [facility] Name of family member notified: Kerri [name] Patient and family notified of of transfer: 07/21/22  Discharge Plan and Services                                     Social Determinants of Health (SDOH) Interventions     Readmission Risk Interventions     No data to display

## 2022-07-27 ENCOUNTER — Other Ambulatory Visit: Payer: Self-pay | Admitting: Urology

## 2022-07-27 NOTE — Telephone Encounter (Signed)
Ok to fill 

## 2022-08-18 ENCOUNTER — Encounter: Payer: Self-pay | Admitting: Urology

## 2022-08-18 ENCOUNTER — Ambulatory Visit: Payer: Medicare Other | Admitting: Urology

## 2022-08-18 VITALS — BP 128/78 | HR 74 | Ht 67.0 in | Wt 165.0 lb

## 2022-08-18 DIAGNOSIS — N390 Urinary tract infection, site not specified: Secondary | ICD-10-CM | POA: Diagnosis not present

## 2022-08-18 DIAGNOSIS — N3281 Overactive bladder: Secondary | ICD-10-CM

## 2022-08-18 LAB — URINALYSIS, COMPLETE
Bilirubin, UA: NEGATIVE
Glucose, UA: NEGATIVE
Ketones, UA: NEGATIVE
Nitrite, UA: POSITIVE — AB
Protein,UA: NEGATIVE
Specific Gravity, UA: 1.015 (ref 1.005–1.030)
Urobilinogen, Ur: 0.2 mg/dL (ref 0.2–1.0)
pH, UA: 5.5 (ref 5.0–7.5)

## 2022-08-18 LAB — MICROSCOPIC EXAMINATION: WBC, UA: >30 /hpf — AB (ref 0–5)

## 2022-08-18 MED ORDER — CEFUROXIME AXETIL 500 MG PO TABS: 500.0000 mg | ORAL_TABLET | Freq: Two times a day (BID) | ORAL | 0 refills | Status: DC

## 2022-08-18 MED ORDER — TROSPIUM CHLORIDE ER 60 MG PO CP24: 60.0000 mg | ORAL_CAPSULE | Freq: Every evening | ORAL | 1 refills | Status: DC

## 2022-08-18 MED ORDER — TRIMETHOPRIM 100 MG PO TABS: 100.0000 mg | ORAL_TABLET | Freq: Every day | ORAL | 3 refills | Status: DC

## 2022-08-18 NOTE — Progress Notes (Signed)
In and Out Catheterization  Patient is present today for a I & O catheterization due to rUTI. Patient was cleaned and prepped in a sterile fashion with betadine . A 14FR cath was inserted no complications were noted , 26ml of urine return was noted, urine was golden yellow in color. A clean urine sample was collected for UA/UCX. Bladder was drained  And catheter was removed with out difficulty.    Performed by: Franchot Erichsen CMA & Ples Specter CMA

## 2022-08-18 NOTE — Progress Notes (Signed)
08/18/2022 12:26 PM   Kerri [name] 1931/08/07 [id number]  Referring provider: Wilford Corner, PA-C [address] [place],  [place] [address]  Urological history: 1. rUTI's -Contributing factors of age, vaginal atrophy, dementia, constipation, prediabetes,  -Documented positive urine cultures over the last year  August 18, 2022 Klebsiella pneumoniae  July 07, 2022 Klebsiella pneumoniae  June 17, 2022 Enterococcus faecalis  Feb 17, 2022 Enterococcus faecalis  January 29, 2022 Enterococcus faecalis  October 15, 2021 multiple species   September 16, 2021 E.coli  -Vaginal estrogen cream, cranberry tablets and trimethoprim 100 mg daily  2. OAB -Contributing factors of age, vaginal atrophy, dementia, prediabetes, anxiety, depression, CHF and HTN -PVR 85 mL    Chief Complaint  Patient presents with   Dysuria    HPI: Kerri [name] is a 88 y.o. female who presents today for possible UTI w/caregiver, Kerri [name], and daughter-in-law, Kerri [name], on Face Time.    Patient w/ dementia.  History obtained from Kerri [name] and Kerri [name].    They feel that for the last week she has been having symptoms of an UTI, which they describe as increased mental confusion and difficulty w/ ambulation.  Patient denies any modifying or aggravating factors.  Patient denies any gross hematuria, dysuria or suprapubic/flank pain.  Patient denies any fevers, chills, nausea or vomiting.    She was recently admitted for delirium secondary to UTI and COVID-19.    PVR 60 mL  CATH UA yellow slightly cloudy, SG 1.015, blood trace, pH 5.5, nitrite positive, leukocytes 1+, > 30 WBC's, 0-2 RBC's, 0-10 epithelial cells and many bacteria.    PMH: Past Medical History:  Diagnosis Date   Arthritis     Surgical History: Past Surgical History:  Procedure Laterality Date   PACEMAKER LEADLESS INSERTION N/A 01/07/2021   Procedure: PACEMAKER LEADLESS INSERTION;  Surgeon: Marcina Millard, MD;  Location:  [facility];  Service: Cardiovascular;  Laterality: N/A;   PPM GENERATOR REMOVAL N/A 01/07/2021   Procedure: PPM GENERATOR REMOVAL;  Surgeon: Marcina Millard, MD;  Location: [facility];  Service: Cardiovascular;  Laterality: N/A;    Home Medications:  Allergies as of 08/18/2022       Reactions   Alendronate Sodium Other (See Comments)   Bupropion Other (See Comments)   Tremors    Rofecoxib Diarrhea   Alendronate Rash        Medication List        Accurate as of August 18, 2022 11:59 PM. If you have any questions, ask your nurse or doctor.          STOP taking these medications    mirabegron ER 25 MG Tb24 tablet Commonly known as: MYRBETRIQ Stopped by: Sausha Raymond, PA-C   nitrofurantoin 50 MG capsule Commonly known as: MACRODANTIN Stopped by: Braniyah Besse, PA-C   tolterodine 4 MG 24 hr capsule Commonly known as: DETROL LA Stopped by: Michiel Cowboy, PA-C       TAKE these medications    acetaminophen 325 MG tablet Commonly known as: TYLENOL Take 2 tablets (650 mg total) by mouth every 6 (six) hours as needed for mild pain, moderate pain or fever.   albuterol 108 (90 Base) MCG/ACT inhaler Commonly known as: VENTOLIN HFA Inhale 2 puffs into the lungs every 6 (six) hours.   CALCIUM-CARB 600 + D PO Take 1 tablet by mouth daily.   cefUROXime 500 MG tablet Commonly known as: CEFTIN Take 1 tablet (500 mg total) by mouth 2 (two)  times daily with a meal. Started by: Michiel Cowboy, PA-C   chlorhexidine 4 % external liquid Commonly known as: Hibiclens Apply topically daily as needed.   cyanocobalamin 1000 MCG/ML injection Commonly known as: VITAMIN B12 Inject 1,000 mcg into the muscle every 30 (thirty) days.   donepezil 10 MG tablet Commonly known as: ARICEPT Take 10 mg by mouth at bedtime.   estradiol 0.1 MG/GM vaginal cream Commonly known as: ESTRACE Estrogen Cream Instruction Discard applicator Apply pea sized amount  to tip of finger to urethra before bed. Wash hands well after application. Use Monday, Wednesday and Friday   fluticasone 50 MCG/ACT nasal spray Commonly known as: FLONASE 2 sprays in both nostrils   folic acid 1 MG tablet Commonly known as: FOLVITE Take 1 mg by mouth daily.   levothyroxine 50 MCG tablet Commonly known as: SYNTHROID Take 50 mcg by mouth daily before breakfast.   memantine 10 MG tablet Commonly known as: NAMENDA Take 10 mg by mouth 2 (two) times daily.   metoprolol succinate 25 MG 24 hr tablet Commonly known as: TOPROL-XL Take 25 mg by mouth daily.   nystatin powder Commonly known as: MYCOSTATIN/NYSTOP Twice a day under skin folds that a red   polyethylene glycol powder 17 GM/SCOOP powder Commonly known as: GLYCOLAX/MIRALAX Take 17 g by mouth daily.   trimethoprim 100 MG tablet Commonly known as: TRIMPEX Take 1 tablet (100 mg total) by mouth daily. Started by: Michiel Cowboy, PA-C   Trospium Chloride 60 MG Cp24 Take 1 capsule (60 mg total) by mouth at bedtime. Started by: Michiel Cowboy, PA-C   venlafaxine XR 75 MG 24 hr capsule Commonly known as: EFFEXOR-XR Take 75 mg by mouth daily with breakfast.   Vitamin D (Ergocalciferol) 1.25 MG (50000 UNIT) Caps capsule Commonly known as: DRISDOL Take 50,000 Units by mouth every 7 (seven) days.        Allergies:  Allergies  Allergen Reactions   Alendronate Sodium Other (See Comments)   Bupropion Other (See Comments)    Tremors      Rofecoxib Diarrhea   Alendronate Rash    Family History: No family history on file.  Social History:  reports that she has never smoked. She has never used smokeless tobacco. She reports that she does not drink alcohol and does not use drugs.  ROS: Pertinent ROS in HPI  Physical Exam: BP 128/78   Pulse 74   Ht 5\' 7"  (1.702 m)   Wt 165 lb (74.8 kg)   BMI 25.84 kg/m   Constitutional:  Well nourished. Alert and oriented, No acute distress. HEENT: [place] AT,  moist mucus membranes.  Trachea midline Cardiovascular: No clubbing, cyanosis, or edema. Respiratory: Normal respiratory effort, no increased work of breathing. Neurologic: Grossly intact, no focal deficits, moving all 4 extremities. Psychiatric: Normal mood and affect.    Laboratory Data: Urinalysis Results for orders placed or performed in visit on 08/18/22  CULTURE, URINE COMPREHENSIVE   Specimen: Urine   UR  Result Value Ref Range   Urine Culture, Comprehensive Final report (A)    Organism ID, Bacteria Klebsiella pneumoniae (A)    ANTIMICROBIAL SUSCEPTIBILITY Comment   Microscopic Examination   Urine  Result Value Ref Range   WBC, UA >30 (A) 0 - 5 /hpf   RBC, Urine 0-2 0 - 2 /hpf   Epithelial Cells (non renal) 0-10 0 - 10 /hpf   Bacteria, UA Many (A) None seen/Few  Urinalysis, Complete  Result Value Ref Range   Specific  Gravity, UA 1.015 1.005 - 1.030   pH, UA 5.5 5.0 - 7.5   Color, UA Yellow Yellow   Appearance Ur Hazy (A) Clear   Leukocytes,UA 1+ (A) Negative   Protein,UA Negative Negative/Trace   Glucose, UA Negative Negative   Ketones, UA Negative Negative   RBC, UA Trace (A) Negative   Bilirubin, UA Negative Negative   Urobilinogen, Ur 0.2 0.2 - 1.0 mg/dL   Nitrite, UA Positive (A) Negative   Microscopic Examination See below:   I have reviewed the labs.   Pertinent Imaging: N/A  Assessment & Plan:    1. rUTI's -Cath UA nitrite positive, pyuria and bacteruria  -Sent for culture -Ceftin 500 mg twice daily is given, will adjust if necessary once urine c & s are back -she will start trimethoprim daily once we have adequately treated the UTI -continue conservative measures and OTC supplements to help prevent UTI's   2. Incontinence -Myrbetriq 25 mg daily has become cost prohibitive -will have a trial of [place] XL 60 mg daily  -script sent to pharmacy   Return for pending urine culture results .  These notes generated with voice recognition software.  I apologize for typographical errors.  Cloretta Ned  [facility] [address]  [address] [place], [place] [address] [phone]

## 2022-08-21 ENCOUNTER — Other Ambulatory Visit: Payer: Self-pay | Admitting: Urology

## 2022-08-21 DIAGNOSIS — N390 Urinary tract infection, site not specified: Secondary | ICD-10-CM

## 2022-08-21 LAB — CULTURE, URINE COMPREHENSIVE

## 2022-08-21 MED ORDER — CIPROFLOXACIN HCL 250 MG PO TABS: 250.0000 mg | ORAL_TABLET | Freq: Two times a day (BID) | ORAL | 0 refills | Status: DC

## 2022-08-25 ENCOUNTER — Other Ambulatory Visit: Payer: Self-pay | Admitting: Urology

## 2022-09-05 ENCOUNTER — Inpatient Hospital Stay
Admission: EM | Admit: 2022-09-05 | Discharge: 2022-09-10 | DRG: 481 | Disposition: A | Discharge status: Skilled Nursing Facility | Payer: Medicare Other | Attending: Internal Medicine | Admitting: Internal Medicine

## 2022-09-05 ENCOUNTER — Encounter: Payer: Self-pay | Admitting: Emergency Medicine

## 2022-09-05 ENCOUNTER — Emergency Department: Payer: Medicare Other

## 2022-09-05 ENCOUNTER — Other Ambulatory Visit: Payer: Self-pay

## 2022-09-05 DIAGNOSIS — D509 Iron deficiency anemia, unspecified: Secondary | ICD-10-CM | POA: Diagnosis present

## 2022-09-05 DIAGNOSIS — I495 Sick sinus syndrome: Secondary | ICD-10-CM | POA: Diagnosis present

## 2022-09-05 DIAGNOSIS — F01518 Vascular dementia, unspecified severity, with other behavioral disturbance: Secondary | ICD-10-CM | POA: Diagnosis present

## 2022-09-05 DIAGNOSIS — Z6826 Body mass index (BMI) 26.0-26.9, adult: Secondary | ICD-10-CM | POA: Diagnosis not present

## 2022-09-05 DIAGNOSIS — Z8781 Personal history of (healed) traumatic fracture: Secondary | ICD-10-CM

## 2022-09-05 DIAGNOSIS — W1839XA Other fall on same level, initial encounter: Secondary | ICD-10-CM | POA: Diagnosis present

## 2022-09-05 DIAGNOSIS — F02818 Dementia in other diseases classified elsewhere, unspecified severity, with other behavioral disturbance: Secondary | ICD-10-CM | POA: Diagnosis present

## 2022-09-05 DIAGNOSIS — S72002A Fracture of unspecified part of neck of left femur, initial encounter for closed fracture: Secondary | ICD-10-CM

## 2022-09-05 DIAGNOSIS — I429 Cardiomyopathy, unspecified: Secondary | ICD-10-CM | POA: Diagnosis present

## 2022-09-05 DIAGNOSIS — I5022 Chronic systolic (congestive) heart failure: Secondary | ICD-10-CM | POA: Diagnosis present

## 2022-09-05 DIAGNOSIS — S72142A Displaced intertrochanteric fracture of left femur, initial encounter for closed fracture: Secondary | ICD-10-CM | POA: Diagnosis present

## 2022-09-05 DIAGNOSIS — Z79899 Other long term (current) drug therapy: Secondary | ICD-10-CM

## 2022-09-05 DIAGNOSIS — Y92008 Other place in unspecified non-institutional (private) residence as the place of occurrence of the external cause: Secondary | ICD-10-CM | POA: Diagnosis not present

## 2022-09-05 DIAGNOSIS — W19XXXA Unspecified fall, initial encounter: Secondary | ICD-10-CM

## 2022-09-05 DIAGNOSIS — F0390 Unspecified dementia without behavioral disturbance: Secondary | ICD-10-CM | POA: Diagnosis present

## 2022-09-05 DIAGNOSIS — Z87440 Personal history of urinary (tract) infections: Secondary | ICD-10-CM | POA: Diagnosis present

## 2022-09-05 DIAGNOSIS — D631 Anemia in chronic kidney disease: Secondary | ICD-10-CM | POA: Diagnosis present

## 2022-09-05 DIAGNOSIS — I442 Atrioventricular block, complete: Secondary | ICD-10-CM | POA: Diagnosis present

## 2022-09-05 DIAGNOSIS — N179 Acute kidney failure, unspecified: Secondary | ICD-10-CM | POA: Diagnosis not present

## 2022-09-05 DIAGNOSIS — R079 Chest pain, unspecified: Principal | ICD-10-CM

## 2022-09-05 DIAGNOSIS — M810 Age-related osteoporosis without current pathological fracture: Secondary | ICD-10-CM | POA: Diagnosis present

## 2022-09-05 DIAGNOSIS — N390 Urinary tract infection, site not specified: Secondary | ICD-10-CM | POA: Diagnosis present

## 2022-09-05 DIAGNOSIS — E878 Other disorders of electrolyte and fluid balance, not elsewhere classified: Secondary | ICD-10-CM | POA: Diagnosis present

## 2022-09-05 DIAGNOSIS — I10 Essential (primary) hypertension: Secondary | ICD-10-CM | POA: Diagnosis present

## 2022-09-05 DIAGNOSIS — E039 Hypothyroidism, unspecified: Secondary | ICD-10-CM | POA: Diagnosis present

## 2022-09-05 DIAGNOSIS — I130 Hypertensive heart and chronic kidney disease with heart failure and stage 1 through stage 4 chronic kidney disease, or unspecified chronic kidney disease: Secondary | ICD-10-CM | POA: Diagnosis present

## 2022-09-05 DIAGNOSIS — G309 Alzheimer's disease, unspecified: Secondary | ICD-10-CM | POA: Diagnosis present

## 2022-09-05 DIAGNOSIS — E440 Moderate protein-calorie malnutrition: Secondary | ICD-10-CM | POA: Diagnosis present

## 2022-09-05 DIAGNOSIS — Z950 Presence of cardiac pacemaker: Secondary | ICD-10-CM

## 2022-09-05 DIAGNOSIS — D62 Acute posthemorrhagic anemia: Secondary | ICD-10-CM | POA: Diagnosis not present

## 2022-09-05 DIAGNOSIS — Z79890 Hormone replacement therapy (postmenopausal): Secondary | ICD-10-CM

## 2022-09-05 DIAGNOSIS — R54 Age-related physical debility: Secondary | ICD-10-CM | POA: Diagnosis present

## 2022-09-05 DIAGNOSIS — N1832 Chronic kidney disease, stage 3b: Secondary | ICD-10-CM | POA: Diagnosis present

## 2022-09-05 LAB — CBC WITH DIFFERENTIAL/PLATELET
Abs Immature Granulocytes: 0.01 10*3/uL (ref 0.00–0.07)
Basophils Absolute: 0.1 10*3/uL (ref 0.0–0.1)
Basophils Relative: 1 %
Eosinophils Absolute: 0.3 10*3/uL (ref 0.0–0.5)
Eosinophils Relative: 3 %
HCT: 44.1 % (ref 36.0–46.0)
Hemoglobin: 14.3 g/dL (ref 12.0–15.0)
Immature Granulocytes: 0 %
Lymphocytes Relative: 35 %
Lymphs Abs: 2.9 10*3/uL (ref 0.7–4.0)
MCH: 28 pg (ref 26.0–34.0)
MCHC: 32.4 g/dL (ref 30.0–36.0)
MCV: 86.3 fL (ref 80.0–100.0)
Monocytes Absolute: 0.5 10*3/uL (ref 0.1–1.0)
Monocytes Relative: 6 %
Neutro Abs: 4.5 10*3/uL (ref 1.7–7.7)
Neutrophils Relative %: 55 %
Platelets: 196 10*3/uL (ref 150–400)
RBC: 5.11 MIL/uL (ref 3.87–5.11)
RDW: 13.6 % (ref 11.5–15.5)
WBC: 8.2 10*3/uL (ref 4.0–10.5)
nRBC: 0 % (ref 0.0–0.2)

## 2022-09-05 LAB — TROPONIN I (HIGH SENSITIVITY): Troponin I (High Sensitivity): 8 ng/L (ref <18)

## 2022-09-05 LAB — BASIC METABOLIC PANEL
Anion gap: 7 (ref 5–15)
BUN: 16 mg/dL (ref 8–23)
CO2: 28 mmol/L (ref 22–32)
Calcium: 9 mg/dL (ref 8.9–10.3)
Chloride: 110 mmol/L (ref 98–111)
Creatinine, Ser: 1.4 mg/dL — ABNORMAL HIGH (ref 0.44–1.00)
GFR, Estimated: 36 mL/min — ABNORMAL LOW (ref >60)
Glucose, Bld: 102 mg/dL — ABNORMAL HIGH (ref 70–99)
Potassium: 3.9 mmol/L (ref 3.5–5.1)
Sodium: 145 mmol/L (ref 135–145)

## 2022-09-05 LAB — PROTIME-INR
INR: 1 (ref 0.8–1.2)
Prothrombin Time: 13.5 seconds (ref 11.4–15.2)

## 2022-09-05 MED ORDER — LACTATED RINGERS IV SOLN: INTRAVENOUS | Status: DC

## 2022-09-05 MED ORDER — POLYETHYLENE GLYCOL 3350 17 G PO PACK: 17.0000 g | PACK | Freq: Every day | ORAL | Status: DC | PRN

## 2022-09-05 MED ORDER — FENTANYL CITRATE PF 50 MCG/ML IJ SOSY
50.0000 ug | PREFILLED_SYRINGE | Freq: Once | INTRAMUSCULAR | Status: AC
  Administered 2022-09-05: 50 ug via INTRAVENOUS
  Filled 2022-09-05: qty 1

## 2022-09-05 MED ORDER — METHOCARBAMOL 1000 MG/10ML IJ SOLN: 500.0000 mg | Freq: Four times a day (QID) | INTRAVENOUS | Status: DC | PRN

## 2022-09-05 MED ORDER — METHOCARBAMOL 500 MG PO TABS: 500.0000 mg | ORAL_TABLET | Freq: Four times a day (QID) | ORAL | Status: DC | PRN

## 2022-09-05 MED ORDER — MORPHINE SULFATE (PF) 2 MG/ML IV SOLN
2.0000 mg | INTRAVENOUS | Status: DC | PRN
  Administered 2022-09-05 – 2022-09-06 (×2): 2 mg via INTRAVENOUS
  Filled 2022-09-05 (×2): qty 1

## 2022-09-05 MED ORDER — TRANEXAMIC ACID-NACL 1000-0.7 MG/100ML-% IV SOLN
1000.0000 mg | INTRAVENOUS | Status: AC
  Administered 2022-09-05: 1000 mg via INTRAVENOUS
  Filled 2022-09-05 (×2): qty 100

## 2022-09-05 MED ORDER — HYDROCODONE-ACETAMINOPHEN 5-325 MG PO TABS: 1.0000 | ORAL_TABLET | Freq: Four times a day (QID) | ORAL | Status: DC | PRN

## 2022-09-05 MED ORDER — DOCUSATE SODIUM 100 MG PO CAPS
100.0000 mg | ORAL_CAPSULE | Freq: Two times a day (BID) | ORAL | Status: DC
  Administered 2022-09-05: 100 mg via ORAL
  Filled 2022-09-05: qty 1

## 2022-09-05 MED ORDER — BISACODYL 5 MG PO TBEC: 5.0000 mg | DELAYED_RELEASE_TABLET | Freq: Every day | ORAL | Status: DC | PRN

## 2022-09-05 MED ORDER — CEFAZOLIN SODIUM-DEXTROSE 2-4 GM/100ML-% IV SOLN
2.0000 g | Freq: Once | INTRAVENOUS | Status: AC
  Administered 2022-09-05: 2 g via INTRAVENOUS
  Filled 2022-09-05: qty 100

## 2022-09-05 NOTE — ED Notes (Signed)
Pt in bed, cardiac monitor placed, pt mildly confused, re oriented pt, pt c/o L hip pain, L hip is tender to palp, pt has indwelling 20 gauge iv in R ac, flush and has positive blood return. Pt also has lac to L temple.  No bleeding at this time.

## 2022-09-05 NOTE — Assessment & Plan Note (Addendum)
Pt has h/o recurrent uti.  No urinary symptoms.  Recently completed the treatment Urinalysis still pending

## 2022-09-05 NOTE — Assessment & Plan Note (Addendum)
Little worsening of creatinine after the surgery, at 1.88.  Baseline appears to be at 1.2-1.4. -Giving some gentle IV fluid. -Monitor renal function -Avoid nephrotoxins

## 2022-09-05 NOTE — Assessment & Plan Note (Addendum)
Cont aricept / namenda and effexor

## 2022-09-05 NOTE — ED Triage Notes (Signed)
Pt via EMS from home. Pt was standing up to look out her window and didn't have her walker. Pt fell on the L sided. Pt has a small hematoma to the L side of her forehead and c/o L sided leg and hip pain. EMS reports shortening with no rotation. EMS  reports, 78 HR, 200/100 BP. Denies LOC. Pt is alert and oriented to self with a hx of dementia. EMS gave of Fentanyl en route.

## 2022-09-05 NOTE — Assessment & Plan Note (Addendum)
-   Continue home Synthroid °

## 2022-09-05 NOTE — Assessment & Plan Note (Addendum)
Metoprolol 25 mg daily to be resumed .

## 2022-09-05 NOTE — Assessment & Plan Note (Signed)
Replete and recheck 

## 2022-09-05 NOTE — Assessment & Plan Note (Addendum)
S/p pacemaker placement. Cardiology clearance was obtained before surgery.

## 2022-09-05 NOTE — H&P (Signed)
History and Physical    Chief Complaint: Left hip fracture.    HISTORY OF PRESENT ILLNESS: Kerri [name] is an 88 y.o. female  seen in ed brought for fall and left hip pain today. Daughter at bedside states pt lives at home and has 24 hour caregiver.  Chart review show echo Jan 2023: 1.Left ventricular ejection fraction, by estimation, is 45 to 50%. The left ventricle has mildly decreased function. The left ventricle demonstrates regional wall motion abnormalities (septal wall motion abnormality consistent with conduction abnormality). There is moderate left ventricular hypertrophy. Left ventricular diastolic parameters are consistent with Grade I diastolic dysfunction (impaired relaxation).  2. Right ventricular systolic function is normal. The right ventricular size is normal. The mitral valve is normal in structure. Mild mitral valve regurgitation. No evidence of mitral stenosis. 3.The aortic valve is normal in structure. Aortic valve regurgitation is not visualized. Aortic valve sclerosis is present, with no evidence of aortic valve stenosis. 4.The inferior vena cava is normal in size with greater than 50% respiratory variability, suggesting right atrial pressure of 3 mmHg. 5.Rhythm appears to be NSR with 2nd degree AV block type I  Pt has PMH as below: Past Medical History:  Diagnosis Date   Arthritis      Review of Systems  Unable to perform ROS: Dementia    Allergies  Allergen Reactions   Alendronate Sodium Other (See Comments)   Bupropion Other (See Comments)    Tremors      Rofecoxib Diarrhea   Alendronate Rash    Past Surgical History:  Procedure Laterality Date   PACEMAKER LEADLESS INSERTION N/A 01/07/2021   Procedure: PACEMAKER LEADLESS INSERTION;  Surgeon: Marcina Millard, MD;  Location: [facility] INVASIVE CV LAB;  Service: Cardiovascular;  Laterality: N/A;   PPM GENERATOR REMOVAL N/A 01/07/2021   Procedure: PPM GENERATOR REMOVAL;  Surgeon:  Marcina Millard, MD;  Location: [facility] INVASIVE CV LAB;  Service: Cardiovascular;  Laterality: N/A;     Social History   Socioeconomic History   Marital status: Widowed    Spouse name: Not on file   Number of children: Not on file   Years of education: Not on file   Highest education level: Not on file  Occupational History   Not on file  Tobacco Use   Smoking status: Never   Smokeless tobacco: Never  Substance and Sexual Activity   Alcohol use: Never   Drug use: Never   Sexual activity: Not on file  Other Topics Concern   Not on file  Social History Narrative   Not on file   Social Determinants of Health   Financial Resource Strain: Low Risk  (05/01/2019)   Overall Financial Resource Strain (CARDIA)    Difficulty of Paying Living Expenses: Not hard at all  Food Insecurity: No Food Insecurity (05/01/2019)   Hunger Vital Sign    Worried About Running Out of Food in the Last Year: Never true    Ran Out of Food in the Last Year: Never true  Transportation Needs: No Transportation Needs (05/01/2019)   PRAPARE - Administrator, Civil Service (Medical): No    Lack of Transportation (Non-Medical): No  Physical Activity: Insufficiently Active (05/01/2019)   Exercise Vital Sign    Days of Exercise per Week: 7 days    Minutes of Exercise per Session: 20 min  Stress: No Stress Concern Present (05/01/2019)   [employer] of Occupational Health - Occupational Stress Questionnaire    Feeling of Stress : Not  at all  Social Connections: Socially Isolated (05/01/2019)   Social Connection and Isolation Panel [NHANES]    Frequency of Communication with Friends and Family: More than three times a week    Frequency of Social Gatherings with Friends and Family: More than three times a week    Attends Religious Services: Never    Database administrator or Organizations: No    Attends Banker Meetings: Never    Marital Status: Widowed     CURRENT  MEDS:   Current Outpatient Medications (Endocrine & Metabolic):    levothyroxine (SYNTHROID) 50 MCG tablet, Take 50 mcg by mouth daily before breakfast.   Current Outpatient Medications (Cardiovascular):    metoprolol succinate (TOPROL-XL) 25 MG 24 hr tablet, Take 25 mg by mouth daily.   Current Outpatient Medications (Respiratory):    albuterol (VENTOLIN HFA) 108 (90 Base) MCG/ACT inhaler, Inhale 2 puffs into the lungs every 6 (six) hours. (Patient not taking: Reported on 08/18/2022)   fluticasone (FLONASE) 50 MCG/ACT nasal spray, 2 sprays in both nostrils  Current Facility-Administered Medications (Analgesics):    HYDROcodone-acetaminophen (NORCO/VICODIN) 5-325 MG per tablet 1-2 tablet   morphine (PF) 2 MG/ML injection 2 mg  Current Outpatient Medications (Analgesics):    acetaminophen (TYLENOL) 325 MG tablet, Take 2 tablets (650 mg total) by mouth every 6 (six) hours as needed for mild pain, moderate pain or fever.  Current Facility-Administered Medications (Hematological):    tranexamic acid (CYKLOKAPRON) IVPB 1,000 mg  Current Outpatient Medications (Hematological):    cyanocobalamin (,VITAMIN B-12,) 1000 MCG/ML injection, Inject 1,000 mcg into the muscle every 30 (thirty) days.   folic acid (FOLVITE) 1 MG tablet, Take 1 mg by mouth daily.  Current Facility-Administered Medications (Other):    bisacodyl (DULCOLAX) EC tablet 5 mg   docusate sodium (COLACE) capsule 100 mg   lactated ringers infusion   methocarbamol (ROBAXIN) tablet 500 mg **OR** methocarbamol (ROBAXIN) 500 mg in dextrose 5 % 50 mL IVPB   polyethylene glycol (MIRALAX / GLYCOLAX) packet 17 g  Current Outpatient Medications (Other):    Calcium Carbonate-Vitamin D (CALCIUM-CARB 600 + D PO), Take 1 tablet by mouth daily.   donepezil (ARICEPT) 10 MG tablet, Take 10 mg by mouth at bedtime.   estradiol (ESTRACE) 0.1 MG/GM vaginal cream, Estrogen Cream Instruction Discard applicator Apply pea sized amount to tip of  finger to urethra before bed. Wash hands well after application. Use Monday, Wednesday and Friday   memantine (NAMENDA) 10 MG tablet, Take 10 mg by mouth 2 (two) times daily.   nystatin (MYCOSTATIN/NYSTOP) powder, Twice a day under skin folds that a red   potassium chloride (KLOR-CON) 10 MEQ tablet, Take 10 mEq by mouth once a week. Every Monday   trimethoprim (TRIMPEX) 100 MG tablet, Take 1 tablet (100 mg total) by mouth daily.   Trospium Chloride 60 MG CP24, Take 1 capsule (60 mg total) by mouth at bedtime.   venlafaxine XR (EFFEXOR-XR) 75 MG 24 hr capsule, Take 75 mg by mouth daily with breakfast.    chlorhexidine (HIBICLENS) 4 % external liquid, Apply topically daily as needed.   ciprofloxacin (CIPRO) 250 MG tablet, Take 1 tablet (250 mg total) by mouth 2 (two) times daily. (Patient not taking: Reported on 09/05/2022)   polyethylene glycol powder (GLYCOLAX/MIRALAX) 17 GM/SCOOP powder, Take 17 g by mouth daily.   Vitamin D, Ergocalciferol, (DRISDOL) 1.25 MG (50000 UNIT) CAPS capsule, Take 50,000 Units by mouth every 7 (seven) days. (Patient not taking: Reported on 09/05/2022)  ED Course: Pt in Ed pt is alert, and cooperative afebrile.  Vitals:   09/05/22 1628 09/05/22 1632 09/05/22 1825  BP:  (!) 142/79 (!) 140/114  Pulse:  62 67  Resp:  18 17  Temp:  (!) 97.5 F (36.4 C) 97.8 F (36.6 C)  TempSrc:  Oral Oral  SpO2:  92% 100%  Weight: 77.1 kg    Height: 5\' 7"  (1.702 m)     No intake/output data recorded. SpO2: 100 % O2 Flow Rate (L/min): 2 L/min Blood work in ed shows: normal cbc and low potassium. Results for orders placed or performed during the hospital encounter of 09/05/22 (from the past 48 hour(s))  CBC with Differential     Status: None   Collection Time: 09/05/22  4:39 PM  Result Value Ref Range   WBC 8.2 4.0 - 10.5 K/uL   RBC 5.11 3.87 - 5.11 MIL/uL   Hemoglobin 14.3 12.0 - 15.0 g/dL   HCT 16.144.1 09.636.0 - 04.546.0 %   MCV 86.3 80.0 - 100.0 fL   MCH 28.0 26.0 - 34.0 pg    MCHC 32.4 30.0 - 36.0 g/dL   RDW 40.913.6 81.111.5 - 91.415.5 %   Platelets 196 150 - 400 K/uL   nRBC 0.0 0.0 - 0.2 %   Neutrophils Relative % 55 %   Neutro Abs 4.5 1.7 - 7.7 K/uL   Lymphocytes Relative 35 %   Lymphs Abs 2.9 0.7 - 4.0 K/uL   Monocytes Relative 6 %   Monocytes Absolute 0.5 0.1 - 1.0 K/uL   Eosinophils Relative 3 %   Eosinophils Absolute 0.3 0.0 - 0.5 K/uL   Basophils Relative 1 %   Basophils Absolute 0.1 0.0 - 0.1 K/uL   Immature Granulocytes 0 %   Abs Immature Granulocytes 0.01 0.00 - 0.07 K/uL    Comment: Performed at [facility], [address] [place], [place] [address]  Basic metabolic panel     Status: Abnormal   Collection Time: 09/05/22  4:39 PM  Result Value Ref Range   Sodium 145 135 - 145 mmol/L   Potassium 3.9 3.5 - 5.1 mmol/L   Chloride 110 98 - 111 mmol/L   CO2 28 22 - 32 mmol/L   Glucose, Bld 102 (H) 70 - 99 mg/dL    Comment: Glucose reference range applies only to samples taken after fasting for at least 8 hours.   BUN 16 8 - 23 mg/dL   Creatinine, Ser 6.211.40 (H) 0.44 - 1.00 mg/dL   Calcium 9.0 8.9 - 30.810.3 mg/dL   GFR, Estimated 36 (L) >60 mL/min    Comment: (NOTE) Calculated using the CKD-EPI Creatinine Equation (2021)    Anion gap 7 5 - 15    Comment: Performed at [facility], [address] [place], [place] [address]  Troponin I (High Sensitivity)     Status: None   Collection Time: 09/05/22  4:39 PM  Result Value Ref Range   Troponin I (High Sensitivity) 8 <18 ng/L    Comment: (NOTE) Elevated high sensitivity troponin I (hsTnI) values and significant  changes across serial measurements may suggest ACS but many other  chronic and acute conditions are known to elevate hsTnI results.  Refer to the "Links" section for chest pain algorithms and additional  guidance. Performed at [facility], [address] [place], [place] [address]   Protime-INR     Status: None   Collection Time: 09/05/22  4:39 PM  Result Value  Ref  Range   Prothrombin Time 13.5 11.4 - 15.2 seconds   INR 1.0 0.8 - 1.2    Comment: (NOTE) INR goal varies based on device and disease states. Performed at [facility], [address] [place], [place] [address]     In Ed pt received  Meds ordered this encounter  Medications   fentaNYL (SUBLIMAZE) injection 50 mcg   ceFAZolin (ANCEF) IVPB 2g/100 mL premix    Order Specific Question:   Antibiotic Indication:    Answer:   Surgical Prophylaxis   tranexamic acid (CYKLOKAPRON) IVPB 1,000 mg   HYDROcodone-acetaminophen (NORCO/VICODIN) 5-325 MG per tablet 1-2 tablet   morphine (PF) 2 MG/ML injection 2 mg   OR Linked Order Group    methocarbamol (ROBAXIN) tablet 500 mg    methocarbamol (ROBAXIN) 500 mg in dextrose 5 % 50 mL IVPB   docusate sodium (COLACE) capsule 100 mg   polyethylene glycol (MIRALAX / GLYCOLAX) packet 17 g   bisacodyl (DULCOLAX) EC tablet 5 mg   lactated ringers infusion    Unresulted Labs (From admission, onward)    None        Admission Imaging : CT Hip Left Wo Contrast  Result Date: 09/05/2022 CLINICAL DATA:  Hip fracture preop planning EXAM: CT OF THE LEFT HIP WITHOUT CONTRAST TECHNIQUE: Multidetector CT imaging of the left hip was performed according to the standard protocol. Multiplanar CT image reconstructions were also generated. RADIATION DOSE REDUCTION: This exam was performed according to the departmental dose-optimization program which includes automated exposure control, adjustment of the mA and/or kV according to patient size and/or use of iterative reconstruction technique. COMPARISON:  Radiographs earlier today FINDINGS: Bones/Joint/Cartilage Acute comminuted mildly displaced left femur intertrochanteric fracture. There is approximately 1.5 cm of shortening with the femoral shaft displaced superiorly and laterally. The femoral head remains located in the acetabulum. The lesser trochanter is displaced medially. Remote posttraumatic change  to the left inferior pubic ramus. Ligaments Suboptimally assessed by CT. Muscles and Tendons Mild edema within the gluteus minimus and medius. Soft tissues Colonic diverticulosis without diverticulitis. IMPRESSION: Intertrochanteric fracture of the left femur as described. Electronically Signed   By: Minerva Fester M.D.   On: 09/05/2022 18:43   CT Head Wo Contrast  Result Date: 09/05/2022 CLINICAL DATA:  88 year old female with head and neck injury from fall. History of normal pressure hydrocephalus. EXAM: CT HEAD WITHOUT CONTRAST CT CERVICAL SPINE WITHOUT CONTRAST TECHNIQUE: Multidetector CT imaging of the head and cervical spine was performed following the standard protocol without intravenous contrast. Multiplanar CT image reconstructions of the cervical spine were also generated. RADIATION DOSE REDUCTION: This exam was performed according to the departmental dose-optimization program which includes automated exposure control, adjustment of the mA and/or kV according to patient size and/or use of iterative reconstruction technique. COMPARISON:  07/07/2022 and prior CTs FINDINGS: CT HEAD FINDINGS Brain: There is no evidence of acute infarct, hemorrhage, extra-axial collection, midline shift or mass lesion/mass effect. Moderate ventriculomegaly, periventricular hypodensities and atrophy are unchanged. Vascular: Carotid atherosclerotic calcifications are noted. Skull: Normal. Negative for fracture or focal lesion. Sinuses/Orbits: No acute finding. Other: Mild LEFT forehead soft tissue swelling noted. CT CERVICAL SPINE FINDINGS Alignment: Reversal of the normal cervical lordosis again noted without evidence of subluxation. Skull base and vertebrae: No acute fracture. No primary bone lesion or focal pathologic process. Soft tissues and spinal canal: No prevertebral fluid or swelling. No visible canal hematoma. Disc levels: Mild to moderate degenerative disc disease/spondylosis from C3-C7 again noted contributing  to central spinal and bony foraminal narrowing at multiple levels. Upper chest: No acute abnormality Other: None IMPRESSION: 1. No evidence of acute intracranial abnormality. Unchanged moderate ventriculomegaly, periventricular hypodensities and atrophy. 2. Mild LEFT forehead soft tissue swelling without fracture. 3. No evidence of acute injury to the cervical spine. Degenerative changes as described. Electronically Signed   By: Harmon Pier M.D.   On: 09/05/2022 18:41   CT Cervical Spine Wo Contrast  Result Date: 09/05/2022 CLINICAL DATA:  88 year old female with head and neck injury from fall. History of normal pressure hydrocephalus. EXAM: CT HEAD WITHOUT CONTRAST CT CERVICAL SPINE WITHOUT CONTRAST TECHNIQUE: Multidetector CT imaging of the head and cervical spine was performed following the standard protocol without intravenous contrast. Multiplanar CT image reconstructions of the cervical spine were also generated. RADIATION DOSE REDUCTION: This exam was performed according to the departmental dose-optimization program which includes automated exposure control, adjustment of the mA and/or kV according to patient size and/or use of iterative reconstruction technique. COMPARISON:  07/07/2022 and prior CTs FINDINGS: CT HEAD FINDINGS Brain: There is no evidence of acute infarct, hemorrhage, extra-axial collection, midline shift or mass lesion/mass effect. Moderate ventriculomegaly, periventricular hypodensities and atrophy are unchanged. Vascular: Carotid atherosclerotic calcifications are noted. Skull: Normal. Negative for fracture or focal lesion. Sinuses/Orbits: No acute finding. Other: Mild LEFT forehead soft tissue swelling noted. CT CERVICAL SPINE FINDINGS Alignment: Reversal of the normal cervical lordosis again noted without evidence of subluxation. Skull base and vertebrae: No acute fracture. No primary bone lesion or focal pathologic process. Soft tissues and spinal canal: No prevertebral fluid or  swelling. No visible canal hematoma. Disc levels: Mild to moderate degenerative disc disease/spondylosis from C3-C7 again noted contributing to central spinal and bony foraminal narrowing at multiple levels. Upper chest: No acute abnormality Other: None IMPRESSION: 1. No evidence of acute intracranial abnormality. Unchanged moderate ventriculomegaly, periventricular hypodensities and atrophy. 2. Mild LEFT forehead soft tissue swelling without fracture. 3. No evidence of acute injury to the cervical spine. Degenerative changes as described. Electronically Signed   By: Harmon Pier M.D.   On: 09/05/2022 18:41   DG Chest 1 View  Result Date: 09/05/2022 CLINICAL DATA:  Hip fracture requiring operative repair; chest pain EXAM: CHEST  1 VIEW COMPARISON:  Radiographs 07/07/2022 FINDINGS: Abandoned pacer leads are unchanged. Leadless pacer. Increased size of the cardiomediastinal silhouette likely due to differences in technique. Chronic bronchitic changes and interstitial thickening. Left lower lung infiltrates or atelectasis. Question minimally displaced right lateral third rib fracture versus projection artifact. IMPRESSION: Left lower lung atelectasis or infiltrates. Question minimally displaced right lateral third rib fracture versus projection artifact. Recommend correlation with site of pain and consider CT for further evaluation. Electronically Signed   By: Minerva Fester M.D.   On: 09/05/2022 17:39   DG Hip Unilat W or Wo Pelvis 2-3 Views Left  Result Date: 09/05/2022 CLINICAL DATA:  Fall with hip fracture requiring repair EXAM: DG HIP (WITH OR WITHOUT PELVIS) 2-3V LEFT COMPARISON:  Radiographs 01/30/2012 FINDINGS: Acute intertrochanteric fracture of the proximal left femur with superior and lateral displacement of the distal femoral shaft. The lesser trochanter is displaced medially. The left femoral head remains located in the acetabulum. Question additional nondisplaced fracture of the medial wall of  the left acetabulum. Remote posttraumatic change about the right superior and inferior pubic rami. Degenerative changes pubic symphysis, both hips, SI joints and lower lumbar spine. Demineralization. IMPRESSION: Acute intertrochanteric fracture of the left femur. Question additional nondisplaced fracture of the  medial wall of the left acetabulum. Consider CT for further evaluation. Electronically Signed   By: Minerva Fester M.D.   On: 09/05/2022 17:34      Physical Examination: Vitals:   09/05/22 1628 09/05/22 1632 09/05/22 1825  BP:  (!) 142/79 (!) 140/114  Pulse:  62 67  Temp:  (!) 97.5 F (36.4 C) 97.8 F (36.6 C)  Resp:  18 17  Height: 5\' 7"  (1.702 m)    Weight: 77.1 kg    SpO2:  92% 100%  TempSrc:  Oral Oral  BMI (Calculated): 26.62     Physical Exam Vitals and nursing note reviewed.  Constitutional:      General: She is not in acute distress.    Appearance: Normal appearance. She is not ill-appearing, toxic-appearing or diaphoretic.  HENT:     Head: Normocephalic and atraumatic.     Right Ear: Hearing and external ear normal.     Left Ear: Hearing and external ear normal.     Nose: Nose normal. No nasal deformity.     Mouth/Throat:     Lips: Pink.     Mouth: Mucous membranes are moist.     Tongue: No lesions.  Eyes:     Extraocular Movements: Extraocular movements intact.     Pupils: Pupils are equal, round, and reactive to light.  Neck:     Vascular: No carotid bruit.  Cardiovascular:     Rate and Rhythm: Normal rate and regular rhythm.     Pulses: Normal pulses.     Heart sounds: Normal heart sounds.  Pulmonary:     Effort: Pulmonary effort is normal.     Breath sounds: Wheezing present.  Abdominal:     General: Bowel sounds are normal. There is no distension.     Palpations: Abdomen is soft. There is no mass.     Tenderness: There is no abdominal tenderness. There is no guarding.     Hernia: No hernia is present.  Musculoskeletal:     Right lower leg: No  edema.     Left lower leg: No edema.  Skin:    General: Skin is warm.  Neurological:     General: No focal deficit present.     Mental Status: She is alert. She is disoriented.     Cranial Nerves: Cranial nerves 2-12 are intact.     Motor: Motor function is intact.  Psychiatric:        Attention and Perception: Attention normal.        Mood and Affect: Mood normal.        Speech: Speech normal.        Behavior: Behavior normal. Behavior is cooperative.        Cognition and Memory: Cognition normal.     Assessment and Plan: * S/p left hip fracture --Mechanical fall resulting in hip fracture. -Orthopedics Dr.Sunny following. -NPO after midnight in anticipation of surgical repair tomorrow. -SCDs overnight, start Lovenox post-operatively (or as per ortho). -Pain control with Robaxin, Vicodin, and Morphine prn. -SW consult for rehab placement. -Will need PT consult post-operatively. -Hip fracture order set utilized. -cardiology clearance requested- consult am message sent- Dr. Allena Katz.  -D/W family about pt's surgical risk and cardiac risk being moderate to  high due to her age and heart history.      Acute lower UTI Pt has h/o recurrent uti. We will get urinalysis.       Chronic kidney disease, stage 3b [facility]) Lab Results  Component Value  Date   CREATININE 1.40 (H) 09/05/2022   CREATININE 1.11 (H) 07/14/2022   CREATININE 1.38 (H) 07/12/2022  Stable. Avoid contrast studies.     Electrolyte abnormality Replace and follow levels.   Essential hypertension Metoprolol 25 mg daily to be resumed post op.   Hypothyroidism NPO after midnight.  Synthroid 50 mcg resume day after tomorrow am team.   Dementia without behavioral disturbance (HCC) Cont aricept / namenda and effexor post op.   CHB (complete heart block) (HCC) Pacemaker interrogation.  Cardiology consult.    DVT prophylaxis:  SCD's   Code Status:  Full Code    Family Communication:   Tationa, Stech (Daughter) [phone]    Disposition Plan:  STR/SNF.   Consults called:  Orthopedic: Dr.Tamikia Chowning. Cardiology: Dr. Gwen Pounds.   Admission status: Inpatient.    Unit/ Expected LOS: Med tele / 3-4 days.    Gertha Calkin MD [facility]  6 PM- 2 AM. Please contact me via secure Chat 6 PM-2 AM.  To contact the [facility] Attending or Consulting provider 7A - 7P or covering provider during after hours 7P -7A, for this patient.   Check the care team in [facility] and look for a) attending/consulting [facility] provider listed and b) the [facility] team listed Log into [email or web address] and use [facility] universal password to access. If you do not have the password, please contact the hospital operator. Locate the [facility] provider you are looking for under [facility] and page to a number that you can be directly reached. If you still have difficulty reaching the provider, please page the [facility] (Director on Call) for the Hospitalists listed on amion for assistance. [email or web address] 09/05/2022, 8:57 PM

## 2022-09-05 NOTE — Assessment & Plan Note (Addendum)
Secondary to mechanical fall, s/p ORIF on 09/06/2022 by orthopedic surgery. PT and OT are recommending SNF. -Continue with pain management and supportive care.

## 2022-09-05 NOTE — ED Provider Notes (Signed)
[facility] Provider Note    Event Date/Time   First MD Initiated Contact with Patient 09/05/22 1629     (approximate)   History   Fall   HPI  Kerri [name] is a 88 y.o. female  who presents to the emergency department today after a fall. Patient does have history of  dementia so history is somewhat limited. However she states that she fell today. Denies any chest pain or lightheadedness before the fall. The patient states that everything hurts. EMS did not that the patient had pain to her left hip and gave fentanyl during transport.       Physical Exam   Triage Vital Signs: ED Triage Vitals  Enc Vitals Group     BP 09/05/22 1632 (!) 142/79     Pulse Rate 09/05/22 1632 62     Resp 09/05/22 1632 18     Temp 09/05/22 1632 (!) 97.5 F (36.4 C)     Temp Source 09/05/22 1632 Oral     SpO2 09/05/22 1632 92 %     Weight 09/05/22 1628 170 lb (77.1 kg)     Height 09/05/22 1628 5\' 7"  (1.702 m)     Head Circumference --      Peak Flow --      Pain Score --      Pain Loc --      Pain Edu? --      Excl. in [place]? --     Most recent vital signs: Vitals:   09/05/22 1632  BP: (!) 142/79  Pulse: 62  Resp: 18  Temp: (!) 97.5 F (36.4 C)  SpO2: 92%   General: Awake, alert, not completely oriented. CV:  Good peripheral perfusion. Regular rate and rhythm. Resp:  Normal effort. Lungs clear. Abd:  No distention.  Other:  Small laceration to left temple. Left hip tender to palpation and manipulation.   ED Results / Procedures / Treatments   Labs (all labs ordered are listed, but only abnormal results are displayed) Labs Reviewed  CBC WITH DIFFERENTIAL/PLATELET  BASIC METABOLIC PANEL  TROPONIN I (HIGH SENSITIVITY)     EKG  I, Nance Pear, attending physician, personally viewed and interpreted this EKG  EKG Time: 1631 Rate: 63 Rhythm: junctional rhythm Axis: normal Intervals: qtc 511 QRS: IVCD ST changes: no st elevation Impression:  abnormal ekg   RADIOLOGY I independently interpreted and visualized the ct head/cervical spine. My interpretation: no bleed. No fracture Radiology interpretation:  IMPRESSION:  1. No evidence of acute intracranial abnormality. Unchanged moderate  ventriculomegaly, periventricular hypodensities and atrophy.  2. Mild LEFT forehead soft tissue swelling without fracture.  3. No evidence of acute injury to the cervical spine. Degenerative  changes as described.    I independently interpreted and visualized the left hip. My interpretation: left hip fracture Radiology interpretation:  IMPRESSION:  Acute intertrochanteric fracture of the left femur.    Question additional nondisplaced fracture of the medial wall of the  left acetabulum. Consider CT for further evaluation.    I independently interpreted and visualized the CXR. My interpretation: No pneumonia Radiology interpretation:  IMPRESSION:  Left lower lung atelectasis or infiltrates.    Question minimally displaced right lateral third rib fracture versus  projection artifact. Recommend correlation with site of pain and  consider CT for further evaluation.      PROCEDURES:  Critical Care performed: No  Procedures   MEDICATIONS ORDERED IN ED: Medications - No data to display  IMPRESSION / MDM / ASSESSMENT AND PLAN / ED COURSE  I reviewed the triage vital signs and the nursing notes.                              Differential diagnosis includes, but is not limited to, hip fracture/dislocation, intracranial bleed.  Patient's presentation is most consistent with acute presentation with potential threat to life or bodily function.  Patient presented to the emergency department today because of concern for left hip pain after a fall. X-ray is concerning for intertrochanteric fracture. Discussed with Dr. Allena Katz with orthopedic surgery who requested CT for better pre op planning. Additionally head ct and cervical spine ct  were performed, neither which showed concerning findings. CXR showed possible rib fracture, but no pneumothorax or effusion. Discussed with Dr. Allena Katz with the hospitalist team who will plan on admission.   FINAL CLINICAL IMPRESSION(S) / ED DIAGNOSES   Final diagnoses:  Fall, initial encounter  Closed fracture of left hip, initial encounter [facility])       Note:  This document was prepared using Dragon voice recognition software and may include unintentional dictation errors.    Phineas Semen, MD 09/05/22 1929

## 2022-09-05 NOTE — Progress Notes (Signed)
Full consult note to follow tomorrow.  Called by ED staff. Imaging reviewed.  - Plan for surgery tomorrow, likely afternoon for L hip IM nail. - NPO after midnight - Hold anticoagulation - Admit to Hospitalist team.

## 2022-09-06 ENCOUNTER — Inpatient Hospital Stay: Payer: Medicare Other | Admitting: Anesthesiology

## 2022-09-06 ENCOUNTER — Encounter: Payer: Self-pay | Admitting: Internal Medicine

## 2022-09-06 ENCOUNTER — Other Ambulatory Visit: Payer: Self-pay

## 2022-09-06 ENCOUNTER — Inpatient Hospital Stay: Payer: Medicare Other

## 2022-09-06 ENCOUNTER — Encounter
Admission: EM | Disposition: A | Discharge status: Skilled Nursing Facility | Payer: Self-pay | Source: Home / Self Care | Attending: Internal Medicine

## 2022-09-06 DIAGNOSIS — Z8781 Personal history of (healed) traumatic fracture: Secondary | ICD-10-CM | POA: Diagnosis not present

## 2022-09-06 HISTORY — PX: INTRAMEDULLARY (IM) NAIL INTERTROCHANTERIC: SHX5875

## 2022-09-06 SURGERY — FIXATION, FRACTURE, INTERTROCHANTERIC, WITH INTRAMEDULLARY ROD
Anesthesia: Spinal | Site: Hip | Laterality: Left

## 2022-09-06 MED ORDER — OXYCODONE HCL 5 MG PO TABS: 2.5000 mg | ORAL_TABLET | ORAL | Status: DC | PRN

## 2022-09-06 MED ORDER — NEOMYCIN-POLYMYXIN B GU 40-200000 IR SOLN
Status: AC
  Filled 2022-09-06: qty 20

## 2022-09-06 MED ORDER — ONDANSETRON HCL 4 MG/2ML IJ SOLN
INTRAMUSCULAR | Status: DC | PRN
  Administered 2022-09-06: 4 mg via INTRAVENOUS

## 2022-09-06 MED ORDER — BUPIVACAINE HCL (PF) 0.5 % IJ SOLN
INTRAMUSCULAR | Status: AC
  Filled 2022-09-06: qty 30

## 2022-09-06 MED ORDER — BUPIVACAINE LIPOSOME 1.3 % IJ SUSP
INTRAMUSCULAR | Status: DC | PRN
  Administered 2022-09-06: 50 mL via INTRAMUSCULAR

## 2022-09-06 MED ORDER — ACETAMINOPHEN 500 MG PO TABS
1000.0000 mg | ORAL_TABLET | Freq: Three times a day (TID) | ORAL | Status: DC
  Administered 2022-09-06 – 2022-09-10 (×8): 1000 mg via ORAL
  Filled 2022-09-06 (×9): qty 2

## 2022-09-06 MED ORDER — METHOCARBAMOL 500 MG PO TABS
500.0000 mg | ORAL_TABLET | Freq: Four times a day (QID) | ORAL | Status: DC | PRN
  Administered 2022-09-08: 500 mg via ORAL
  Filled 2022-09-06: qty 1

## 2022-09-06 MED ORDER — DEXAMETHASONE SODIUM PHOSPHATE 10 MG/ML IJ SOLN
INTRAMUSCULAR | Status: AC
  Filled 2022-09-06: qty 1

## 2022-09-06 MED ORDER — DOCUSATE SODIUM 100 MG PO CAPS
100.0000 mg | ORAL_CAPSULE | Freq: Two times a day (BID) | ORAL | Status: DC
  Administered 2022-09-06 – 2022-09-10 (×3): 100 mg via ORAL
  Filled 2022-09-06 (×3): qty 1

## 2022-09-06 MED ORDER — METOCLOPRAMIDE HCL 5 MG/ML IJ SOLN: 5.0000 mg | Freq: Three times a day (TID) | INTRAMUSCULAR | Status: DC | PRN

## 2022-09-06 MED ORDER — DEXAMETHASONE SODIUM PHOSPHATE 10 MG/ML IJ SOLN
INTRAMUSCULAR | Status: DC | PRN
  Administered 2022-09-06: 10 mg via INTRAVENOUS

## 2022-09-06 MED ORDER — CEFAZOLIN SODIUM-DEXTROSE 2-4 GM/100ML-% IV SOLN
INTRAVENOUS | Status: AC
  Filled 2022-09-06: qty 100

## 2022-09-06 MED ORDER — PROPOFOL 500 MG/50ML IV EMUL
INTRAVENOUS | Status: DC | PRN
  Administered 2022-09-06: 50 ug/kg/min via INTRAVENOUS

## 2022-09-06 MED ORDER — FLEET ENEMA 7-19 GM/118ML RE ENEM: 1.0000 | ENEMA | Freq: Once | RECTAL | Status: DC | PRN

## 2022-09-06 MED ORDER — OXYCODONE HCL 5 MG PO TABS: 5.0000 mg | ORAL_TABLET | Freq: Once | ORAL | Status: DC | PRN

## 2022-09-06 MED ORDER — FENTANYL CITRATE (PF) 100 MCG/2ML IJ SOLN
INTRAMUSCULAR | Status: AC
  Filled 2022-09-06: qty 2

## 2022-09-06 MED ORDER — TRIMETHOPRIM 100 MG PO TABS
100.0000 mg | ORAL_TABLET | Freq: Every day | ORAL | Status: DC
  Administered 2022-09-07 – 2022-09-10 (×4): 100 mg via ORAL
  Filled 2022-09-06 (×5): qty 1

## 2022-09-06 MED ORDER — ONDANSETRON HCL 4 MG/2ML IJ SOLN: 4.0000 mg | Freq: Four times a day (QID) | INTRAMUSCULAR | Status: DC | PRN

## 2022-09-06 MED ORDER — ONDANSETRON HCL 4 MG/2ML IJ SOLN
INTRAMUSCULAR | Status: AC
  Filled 2022-09-06: qty 2

## 2022-09-06 MED ORDER — LEVOTHYROXINE SODIUM 50 MCG PO TABS
50.0000 ug | ORAL_TABLET | Freq: Every day | ORAL | Status: DC
  Administered 2022-09-07 – 2022-09-10 (×3): 50 ug via ORAL
  Filled 2022-09-06 (×3): qty 1

## 2022-09-06 MED ORDER — SENNOSIDES-DOCUSATE SODIUM 8.6-50 MG PO TABS: 1.0000 | ORAL_TABLET | Freq: Every evening | ORAL | Status: DC | PRN

## 2022-09-06 MED ORDER — METOCLOPRAMIDE HCL 5 MG PO TABS: 5.0000 mg | ORAL_TABLET | Freq: Three times a day (TID) | ORAL | Status: DC | PRN

## 2022-09-06 MED ORDER — FENTANYL CITRATE (PF) 100 MCG/2ML IJ SOLN: 25.0000 ug | INTRAMUSCULAR | Status: DC | PRN

## 2022-09-06 MED ORDER — SODIUM CHLORIDE 0.9 % IR SOLN
Status: DC | PRN
  Administered 2022-09-06: 1000 mL

## 2022-09-06 MED ORDER — OXYCODONE HCL 5 MG/5ML PO SOLN: 5.0000 mg | Freq: Once | ORAL | Status: DC | PRN

## 2022-09-06 MED ORDER — ACETAMINOPHEN 10 MG/ML IV SOLN: 1000.0000 mg | Freq: Once | INTRAVENOUS | Status: DC | PRN

## 2022-09-06 MED ORDER — CEFAZOLIN SODIUM-DEXTROSE 2-3 GM-%(50ML) IV SOLR
INTRAVENOUS | Status: DC | PRN
  Administered 2022-09-06: 2 g via INTRAVENOUS

## 2022-09-06 MED ORDER — EPHEDRINE SULFATE (PRESSORS) 50 MG/ML IJ SOLN
INTRAMUSCULAR | Status: DC | PRN
  Administered 2022-09-06 (×3): 5 mg via INTRAVENOUS

## 2022-09-06 MED ORDER — ONDANSETRON HCL 4 MG PO TABS: 4.0000 mg | ORAL_TABLET | Freq: Four times a day (QID) | ORAL | Status: DC | PRN

## 2022-09-06 MED ORDER — PROPOFOL 10 MG/ML IV BOLUS
INTRAVENOUS | Status: DC | PRN
  Administered 2022-09-06 (×2): 30 mg via INTRAVENOUS

## 2022-09-06 MED ORDER — BUPIVACAINE LIPOSOME 1.3 % IJ SUSP
INTRAMUSCULAR | Status: AC
  Filled 2022-09-06: qty 20

## 2022-09-06 MED ORDER — PHENYLEPHRINE HCL (PRESSORS) 10 MG/ML IV SOLN
INTRAVENOUS | Status: DC | PRN
  Administered 2022-09-06 (×2): 80 ug via INTRAVENOUS
  Administered 2022-09-06 (×2): 120 ug via INTRAVENOUS
  Administered 2022-09-06 (×2): 80 ug via INTRAVENOUS
  Administered 2022-09-06 (×2): 120 ug via INTRAVENOUS

## 2022-09-06 MED ORDER — TRAMADOL HCL 50 MG PO TABS: 50.0000 mg | ORAL_TABLET | Freq: Four times a day (QID) | ORAL | Status: DC | PRN

## 2022-09-06 MED ORDER — ACETAMINOPHEN 325 MG PO TABS: 325.0000 mg | ORAL_TABLET | Freq: Four times a day (QID) | ORAL | Status: DC | PRN

## 2022-09-06 MED ORDER — CEFAZOLIN SODIUM-DEXTROSE 2-4 GM/100ML-% IV SOLN
2.0000 g | Freq: Four times a day (QID) | INTRAVENOUS | Status: AC
  Administered 2022-09-07 (×2): 2 g via INTRAVENOUS
  Filled 2022-09-06 (×3): qty 100

## 2022-09-06 MED ORDER — OXYCODONE HCL 5 MG PO TABS
5.0000 mg | ORAL_TABLET | ORAL | Status: DC | PRN
  Administered 2022-09-07: 5 mg via ORAL
  Administered 2022-09-08: 10 mg via ORAL
  Filled 2022-09-06: qty 1
  Filled 2022-09-06 (×2): qty 2

## 2022-09-06 MED ORDER — DONEPEZIL HCL 5 MG PO TABS
10.0000 mg | ORAL_TABLET | Freq: Every day | ORAL | Status: DC
  Administered 2022-09-06 – 2022-09-09 (×3): 10 mg via ORAL
  Filled 2022-09-06 (×3): qty 2

## 2022-09-06 MED ORDER — MEMANTINE HCL 5 MG PO TABS
10.0000 mg | ORAL_TABLET | Freq: Two times a day (BID) | ORAL | Status: DC
  Administered 2022-09-06 – 2022-09-10 (×7): 10 mg via ORAL
  Filled 2022-09-06 (×7): qty 2

## 2022-09-06 MED ORDER — ACETAMINOPHEN 10 MG/ML IV SOLN
INTRAVENOUS | Status: DC | PRN
  Administered 2022-09-06: 1000 mg via INTRAVENOUS

## 2022-09-06 MED ORDER — HYDROMORPHONE HCL 1 MG/ML IJ SOLN: 0.5000 mg | INTRAMUSCULAR | Status: DC | PRN

## 2022-09-06 MED ORDER — PHENYLEPHRINE HCL-NACL 20-0.9 MG/250ML-% IV SOLN
INTRAVENOUS | Status: DC | PRN
  Administered 2022-09-06: 20 ug/min via INTRAVENOUS

## 2022-09-06 MED ORDER — BISACODYL 10 MG RE SUPP
10.0000 mg | Freq: Every day | RECTAL | Status: DC | PRN
  Administered 2022-09-07: 10 mg via RECTAL
  Filled 2022-09-06: qty 1

## 2022-09-06 MED ORDER — ENOXAPARIN SODIUM 40 MG/0.4ML IJ SOSY
40.0000 mg | PREFILLED_SYRINGE | INTRAMUSCULAR | Status: DC
  Administered 2022-09-07: 40 mg via SUBCUTANEOUS
  Filled 2022-09-06: qty 0.4

## 2022-09-06 MED ORDER — TRANEXAMIC ACID-NACL 1000-0.7 MG/100ML-% IV SOLN
INTRAVENOUS | Status: AC
  Filled 2022-09-06: qty 100

## 2022-09-06 MED ORDER — ACETAMINOPHEN 10 MG/ML IV SOLN
INTRAVENOUS | Status: AC
  Filled 2022-09-06: qty 100

## 2022-09-06 MED ORDER — EPHEDRINE 5 MG/ML INJ
INTRAVENOUS | Status: AC
  Filled 2022-09-06: qty 5

## 2022-09-06 MED ORDER — FENTANYL CITRATE (PF) 100 MCG/2ML IJ SOLN
INTRAMUSCULAR | Status: DC | PRN
  Administered 2022-09-06: 25 ug via INTRAVENOUS

## 2022-09-06 MED ORDER — VENLAFAXINE HCL ER 75 MG PO CP24
75.0000 mg | ORAL_CAPSULE | Freq: Every day | ORAL | Status: DC
  Administered 2022-09-07 – 2022-09-10 (×4): 75 mg via ORAL
  Filled 2022-09-06 (×4): qty 1

## 2022-09-06 MED ORDER — SODIUM CHLORIDE 0.9 % IV SOLN: INTRAVENOUS | Status: DC

## 2022-09-06 MED ORDER — ADULT MULTIVITAMIN W/MINERALS CH
1.0000 | ORAL_TABLET | Freq: Every day | ORAL | Status: DC
  Administered 2022-09-07 – 2022-09-10 (×4): 1 via ORAL
  Filled 2022-09-06 (×4): qty 1

## 2022-09-06 MED ORDER — BUPIVACAINE HCL (PF) 0.5 % IJ SOLN
INTRAMUSCULAR | Status: DC | PRN
  Administered 2022-09-06: 2.3 mL via INTRATHECAL

## 2022-09-06 MED ORDER — ENSURE ENLIVE PO LIQD
237.0000 mL | Freq: Three times a day (TID) | ORAL | Status: DC
  Administered 2022-09-07 – 2022-09-10 (×5): 237 mL via ORAL

## 2022-09-06 MED ORDER — METHOCARBAMOL 1000 MG/10ML IJ SOLN: 500.0000 mg | Freq: Four times a day (QID) | INTRAVENOUS | Status: DC | PRN

## 2022-09-06 MED ORDER — METOPROLOL SUCCINATE ER 25 MG PO TB24
25.0000 mg | ORAL_TABLET | Freq: Every day | ORAL | Status: DC
  Administered 2022-09-07 – 2022-09-10 (×5): 25 mg via ORAL
  Filled 2022-09-06 (×4): qty 1

## 2022-09-06 MED ORDER — PROPOFOL 1000 MG/100ML IV EMUL
INTRAVENOUS | Status: AC
  Filled 2022-09-06: qty 100

## 2022-09-06 MED ORDER — ONDANSETRON HCL 4 MG/2ML IJ SOLN: 4.0000 mg | Freq: Once | INTRAMUSCULAR | Status: DC | PRN

## 2022-09-06 MED ORDER — KETOROLAC TROMETHAMINE 15 MG/ML IJ SOLN
7.5000 mg | Freq: Four times a day (QID) | INTRAMUSCULAR | Status: AC
  Administered 2022-09-06 – 2022-09-07 (×4): 7.5 mg via INTRAVENOUS
  Filled 2022-09-06 (×4): qty 1

## 2022-09-06 SURGICAL SUPPLY — 45 items
BIT DRILL INTERTAN LAG SCREW (BIT) IMPLANT
BIT DRILL LONG 4.0 (BIT) IMPLANT
BLADE SURG 15 STRL LF DISP TIS (BLADE) ×1 IMPLANT
BLADE SURG 15 STRL SS (BLADE) ×1
CHLORAPREP W/TINT 26 (MISCELLANEOUS) ×1 IMPLANT
DRAPE 3/4 80X56 (DRAPES) ×1 IMPLANT
DRAPE SURG 17X11 SM STRL (DRAPES) ×2 IMPLANT
DRAPE U-SHAPE 47X51 STRL (DRAPES) ×2 IMPLANT
DRILL BIT LONG 4.0 (BIT) ×1
DRSG OPSITE POSTOP 3X4 (GAUZE/BANDAGES/DRESSINGS) ×3 IMPLANT
DRSG OPSITE POSTOP 4X6 (GAUZE/BANDAGES/DRESSINGS) IMPLANT
ELECT REM PT RETURN 9FT ADLT (ELECTROSURGICAL) ×1
ELECTRODE REM PT RTRN 9FT ADLT (ELECTROSURGICAL) ×1 IMPLANT
GLOVE BIOGEL PI IND STRL 8 (GLOVE) ×1 IMPLANT
GLOVE SURG SYN 7.5  E (GLOVE) ×1
GLOVE SURG SYN 7.5 E (GLOVE) ×1 IMPLANT
GLOVE SURG SYN 7.5 PF PI (GLOVE) ×1 IMPLANT
GOWN STRL REUS W/ TWL LRG LVL3 (GOWN DISPOSABLE) ×1 IMPLANT
GOWN STRL REUS W/ TWL XL LVL3 (GOWN DISPOSABLE) ×1 IMPLANT
GOWN STRL REUS W/TWL LRG LVL3 (GOWN DISPOSABLE) ×1
GOWN STRL REUS W/TWL XL LVL3 (GOWN DISPOSABLE) ×1
GUIDE PIN 3.2X343 (PIN) ×2
GUIDE PIN 3.2X343MM (PIN) ×2
KIT PATIENT CARE HANA TABLE (KITS) ×1 IMPLANT
KIT TURNOVER KIT A (KITS) ×1 IMPLANT
MANIFOLD NEPTUNE II (INSTRUMENTS) ×1 IMPLANT
MAT ABSORB  FLUID 56X50 GRAY (MISCELLANEOUS) ×2
MAT ABSORB FLUID 56X50 GRAY (MISCELLANEOUS) ×2 IMPLANT
NAIL TRIGEN INTERTAN 10X18CM (Nail) IMPLANT
NDL FILTER BLUNT 18X1 1/2 (NEEDLE) ×1 IMPLANT
NEEDLE FILTER BLUNT 18X1 1/2 (NEEDLE) ×1 IMPLANT
NEEDLE HYPO 22GX1.5 SAFETY (NEEDLE) ×1 IMPLANT
NS IRRIG 1000ML POUR BTL (IV SOLUTION) ×1 IMPLANT
PACK HIP COMPR (MISCELLANEOUS) ×1 IMPLANT
PIN GUIDE 3.2X343MM (PIN) IMPLANT
SCREW LAG COMPR KIT 105/100 (Screw) IMPLANT
SCREW TRIGEN LOW PROF 5.0X30 (Screw) IMPLANT
SCREW TRIGEN LOW PROF 5.0X32.5 (Screw) IMPLANT
STAPLER SKIN PROX 35W (STAPLE) ×1 IMPLANT
SUT VIC AB 2-0 CT2 27 (SUTURE) ×1 IMPLANT
SYR 10ML LL (SYRINGE) ×1 IMPLANT
SYR 30ML LL (SYRINGE) ×1 IMPLANT
TAPE CLOTH 3X10 WHT NS LF (GAUZE/BANDAGES/DRESSINGS) ×2 IMPLANT
TRAP FLUID SMOKE EVACUATOR (MISCELLANEOUS) ×1 IMPLANT
WATER STERILE IRR 500ML POUR (IV SOLUTION) ×1 IMPLANT

## 2022-09-06 NOTE — H&P (Signed)
H&P reviewed. No significant changes noted. Cardiology clearance obtained.

## 2022-09-06 NOTE — Anesthesia Procedure Notes (Signed)
Spinal  Patient location during procedure: OR Reason for block: surgical anesthesia Staffing Performed: anesthesiologist  Anesthesiologist: , , MD Performed by: , , MD Authorized by: , , MD   Preanesthetic Checklist Completed: patient identified, IV checked, site marked, risks and benefits discussed, surgical consent, monitors and equipment checked, pre-op evaluation and timeout performed Spinal Block Patient position: left lateral decubitus Prep: ChloraPrep and site prepped and draped Patient monitoring: heart rate, continuous pulse ox, blood pressure and cardiac monitor Approach: midline Location: L3-4 Injection technique: single-shot Needle Needle type: Quincke  Needle gauge: 22 G Needle length: 9 cm Assessment Sensory level: T10 Events: CSF return Additional Notes Meticulous sterile technique used throughout (CHG prep, sterile gloves, sterile drape). Negative paresthesia. Negative blood return. Positive free-flowing CSF. Expiration date of kit checked and confirmed. Patient tolerated procedure well, without complications.     

## 2022-09-06 NOTE — Anesthesia Preprocedure Evaluation (Signed)
Anesthesia Evaluation  Patient identified by MRN, date of birth, ID band Patient confused    Reviewed: Allergy & Precautions, NPO status , Patient's Chart, lab work & pertinent test results, Unable to perform ROS - Chart review only  History of Anesthesia Complications Negative for: history of anesthetic complications  Airway Mallampati: IV  TM Distance: >3 FB Neck ROM: Full    Dental  (+) Loose, Poor Dentition,    Pulmonary neg pulmonary ROS, neg sleep apnea, neg COPD, Patient abstained from smoking.Not current smoker   Pulmonary exam normal breath sounds clear to auscultation       Cardiovascular Exercise Tolerance: Poor METShypertension, Pt. on medications +CHF  (-) CAD and (-) Past MI + dysrhythmias + pacemaker  Rhythm:Regular Rate:Normal - Systolic murmurs Per cardiology, optimized at acceptable risk. Mild CHF   Neuro/Psych  PSYCHIATRIC DISORDERS     Dementia negative neurological ROS     GI/Hepatic ,neg GERD  ,,(+)     (-) substance abuse    Endo/Other  neg diabetesHypothyroidism    Renal/GU CRFRenal diseasenegative Renal ROS     Musculoskeletal   Abdominal   Peds  Hematology   Anesthesia Other Findings Past Medical History: No date: Arthritis  Reproductive/Obstetrics                              Anesthesia Physical Anesthesia Plan  ASA: 3  Anesthesia Plan: Spinal   Post-op Pain Management: Ofirmev IV (intra-op)*   Induction: Intravenous  PONV Risk Score and Plan: 2 and Ondansetron, Dexamethasone, Propofol infusion, TIVA and Treatment may vary due to age or medical condition  Airway Management Planned: Natural Airway  Additional Equipment: None  Intra-op Plan:   Post-operative Plan:   Informed Consent: I have reviewed the patients History and Physical, chart, labs and discussed the procedure including the risks, benefits and alternatives for the proposed anesthesia  with the patient or authorized representative who has indicated his/her understanding and acceptance.     Consent reviewed with POA  Plan Discussed with: CRNA and Surgeon  Anesthesia Plan Comments: (Discussed R/B/A of neuraxial anesthesia technique with patient's son: - rare risks of spinal/epidural hematoma, nerve damage, infection - Risk of PDPH - Risk of nausea and vomiting - Risk of conversion to general anesthesia and its associated risks, including sore throat, damage to lips/eyes/teeth/oropharynx, and rare risks such as cardiac and respiratory events. - Risk of allergic reactions  Discussed the role of CRNA in patient's perioperative care.  He  voiced understanding.)         Anesthesia Quick Evaluation

## 2022-09-06 NOTE — Transfer of Care (Signed)
Immediate Anesthesia Transfer of Care Note  Patient: Kerri [name]  Procedure(s) Performed: INTRAMEDULLARY (IM) NAIL INTERTROCHANTERIC (Left: Hip)  Patient Location: PACU  Anesthesia Type:Spinal  Level of Consciousness: awake and alert   Airway & Oxygen Therapy: Patient connected to face mask oxygen  Post-op Assessment: Report given to RN and Post -op Vital signs reviewed and stable  Post vital signs: Reviewed and stable  Last Vitals:  Vitals Value Taken Time  BP 119/36 09/06/22 1602  Temp 36.2 C 09/06/22 1602  Pulse 60 09/06/22 1607  Resp 15 09/06/22 1610  SpO2 94 % 09/06/22 1607  Vitals shown include unvalidated device data.  Last Pain:  Vitals:   09/06/22 1410  TempSrc: Temporal  PainSc:       Patients Stated Pain Goal: 0 (09/05/22 2308)  Complications: No notable events documented.

## 2022-09-06 NOTE — Op Note (Signed)
DATE OF SURGERY: 09/06/2022  PREOPERATIVE DIAGNOSIS: Left intertrochanteric hip fracture  POSTOPERATIVE DIAGNOSIS: Left intertrochanteric hip fracture  PROCEDURE: Intramedullary nailing of Left femur with cephalomedullary device  SURGEON: Rosealee Albee, MD  ANESTHESIA: spinal  EBL: 100 cc  IVF: per anesthesia record  COMPONENTS:  [employer] Trigen Intertan Short Nail: 10x186mm; lag screw with compression screw; 5x 32.79mm distal cortical interlocking screw  INDICATIONS: Kerri [name] is a 88 y.o. female who sustained an intertrochanteric fracture after a fall. Risks and benefits of intramedullary nailing were explained to the patient and/or family . Risks include but are not limited to bleeding, infection, injury to tissues, nerves, vessels, nonunion/malunion, hardware failure, limb length discrepancy/hip rotation mismatch and risks of anesthesia. The patient and/or family understand these risks, have completed an informed consent, and wish to proceed.   PROCEDURE:  The patient was brought into the operating room. After administering anesthesia, the patient was placed in the supine position on the [place] table. The uninjured leg was placed in an extended position while the injured lower extremity was placed in longitudinal traction. The fracture was reduced using longitudinal traction and internal rotation. The adequacy of reduction was verified fluoroscopically in AP and lateral projections and found to be acceptable. The lateral aspect of the hip and thigh were prepped with ChloraPrep solution before being draped sterilely. Preoperative IV antibiotics were administered. A timeout was performed to verify the appropriate surgical site, patient, and procedure.    The greater trochanter was identified and an approximately 6 cm incision was made about 3 fingerbreadths above the tip of the greater trochanter. The incision was carried down through the subcutaneous tissues to  expose the gluteal fascia. This was split the length of the incision, providing access to the tip of the trochanter. Under fluoroscopic guidance, a guidewire was drilled through the tip of the trochanter into the proximal metaphysis to the level of the lesser trochanter. After verifying its position fluoroscopically in AP and lateral projections, it was overreamed with the opening reamer to the level of the lesser trochanter. The nail was selected and advanced to the appropriate depth as verified fluoroscopically.    The guide system for the lag screw was positioned and advanced through an approximately 5cm incision over the lateral aspect of the proximal femur. The guidewire was drilled up through the femoral nail and into the femoral neck to rest within 5 mm of subchondral bone. After verifying its position in the femoral neck and head in both AP and lateral projections, the guidewire was measured and appropriate sized lag screw was selected.  The channel for the compression screw was drilled and antirotation bar was placed.  Lag screw was drilled and placed in appropriate position.  Compression screw was then placed.  Appropriate compression was achieved.  The set screw was locked in place. Again, the adequacy of hardware position and fracture reduction was verified fluoroscopically in AP and lateral projections.   Attention was then turned to the distal interlocking screw in the diaphysis. Using a targeted assembly, a stab incision was made and hole was drilled through the nail. An interlocking screw was placed with excellent purchase.  Appropriate screw position was verified fluoroscopically in AP and lateral projections.   The wounds were irrigated thoroughly with sterile saline solution. Local anesthetic was injected into the wounds. The subcutaneous tissues were closed using 2-0 Vicryl interrupted sutures. The skin was closed using staples. Sterile occlusive dressings were applied to all wounds. The  patient was then transferred to the recovery room in satisfactory condition.   POSTOPERATIVE PLAN: The patient will be WBAT on the operative extremity. Lovenox 40mg /day x 4 weeks to start on POD#1. Perioperative IV antibiotics x 24 hours. PT/OT on POD#1.

## 2022-09-06 NOTE — Consult Note (Signed)
ORTHOPAEDIC CONSULTATION  REQUESTING PHYSICIAN: Wouk, Wilfred CurtisNoah Bedford, MD  Chief Complaint:   L hip pain  History of Present Illness: Due to patient's dementia, history obtained from discussion with family, other medical providers, and review of chart.  Kerri [name] is a 88 y.o. female with a medical history significant for dementia as well as pacemaker placement and CKD who had a fall at her house yesterday.  The patient noted immediate hip pain and inability to ambulate.  The patient ambulates with a walker at baseline.  The patient lives at home with essentially full-time caregivers. Pain is worse with any sort of movement.  Imaging in the emergency department show a left intertrochanteric hip fracture.  Past Medical History:  Diagnosis Date   Arthritis    Past Surgical History:  Procedure Laterality Date   PACEMAKER LEADLESS INSERTION N/A 01/07/2021   Procedure: PACEMAKER LEADLESS INSERTION;  Surgeon: Marcina MillardParaschos, Alexander, MD;  Location: [facility];  Service: Cardiovascular;  Laterality: N/A;   PPM GENERATOR REMOVAL N/A 01/07/2021   Procedure: PPM GENERATOR REMOVAL;  Surgeon: Marcina MillardParaschos, Alexander, MD;  Location: [facility];  Service: Cardiovascular;  Laterality: N/A;   Social History   Socioeconomic History   Marital status: Widowed    Spouse name: Not on file   Number of children: Not on file   Years of education: Not on file   Highest education level: Not on file  Occupational History   Not on file  Tobacco Use   Smoking status: Never   Smokeless tobacco: Never  Substance and Sexual Activity   Alcohol use: Never   Drug use: Never   Sexual activity: Not on file  Other Topics Concern   Not on file  Social History Narrative   Not on file   Social Determinants of Health   Financial Resource Strain: Low Risk  (05/01/2019)   Overall Financial Resource Strain (CARDIA)    Difficulty of  Paying Living Expenses: Not hard at all  Food Insecurity: No Food Insecurity (05/01/2019)   Hunger Vital Sign    Worried About Running Out of Food in the Last Year: Never true    Ran Out of Food in the Last Year: Never true  Transportation Needs: No Transportation Needs (05/01/2019)   PRAPARE - Administrator, Civil ServiceTransportation    Lack of Transportation (Medical): No    Lack of Transportation (Non-Medical): No  Physical Activity: Insufficiently Active (05/01/2019)   Exercise Vital Sign    Days of Exercise per Week: 7 days    Minutes of Exercise per Session: 20 min  Stress: No Stress Concern Present (05/01/2019)   [employer] - Occupational Stress Questionnaire    Feeling of Stress : Not at all  Social Connections: Socially Isolated (05/01/2019)   Social Connection and Isolation Panel [NHANES]    Frequency of Communication with Friends and Family: More than three times a week    Frequency of Social Gatherings with Friends and Family: More than three times a week    Attends Religious Services: Never    Database administratorActive Member of Clubs or Organizations: No    Attends BankerClub or Organization Meetings: Never    Marital Status: Widowed   History reviewed. No pertinent family history. Allergies  Allergen Reactions   Alendronate Sodium Other (See Comments)   Bupropion Other (See Comments)    Tremors      Rofecoxib Diarrhea   Alendronate Rash   Prior to Admission medications   Medication Sig Start Date End  Date Taking? Authorizing Provider  Calcium Carbonate-Vitamin D (CALCIUM-CARB 600 + D PO) Take 1 tablet by mouth daily.   Yes [provider]  cyanocobalamin (,VITAMIN B-12,) 1000 MCG/ML injection Inject 1,000 mcg into the muscle every 30 (thirty) days. 11/11/21  Yes [provider]  donepezil (ARICEPT) 10 MG tablet Take 10 mg by mouth at bedtime.   Yes [provider]  estradiol (ESTRACE) 0.1 MG/GM vaginal cream Estrogen Cream Instruction Discard applicator Apply  pea sized amount to tip of finger to urethra before bed. Wash hands well after application. Use Monday, Wednesday and Friday 03/17/22  Yes Sondra Come, MD  folic acid (FOLVITE) 1 MG tablet Take 1 mg by mouth daily.   Yes [provider]  levothyroxine (SYNTHROID) 50 MCG tablet Take 50 mcg by mouth daily before breakfast. 11/17/18  Yes [provider]  memantine (NAMENDA) 10 MG tablet Take 10 mg by mouth 2 (two) times daily. 02/19/19  Yes [provider]  metoprolol succinate (TOPROL-XL) 25 MG 24 hr tablet Take 25 mg by mouth daily.   Yes [provider]  nystatin (MYCOSTATIN/NYSTOP) powder Twice a day under skin folds that a red 07/21/22  Yes Wieting, Richard, MD  potassium chloride (KLOR-CON) 10 MEQ tablet Take 10 mEq by mouth once a week. Every Monday   Yes [provider]  trimethoprim (TRIMPEX) 100 MG tablet Take 1 tablet (100 mg total) by mouth daily. 08/18/22  Yes McGowan, Carollee Herter A, PA-C  Trospium Chloride 60 MG CP24 Take 1 capsule (60 mg total) by mouth at bedtime. 08/18/22  Yes McGowan, Carollee Herter A, PA-C  venlafaxine XR (EFFEXOR-XR) 75 MG 24 hr capsule Take 75 mg by mouth daily with breakfast.  03/09/19  Yes [provider]  acetaminophen (TYLENOL) 325 MG tablet Take 2 tablets (650 mg total) by mouth every 6 (six) hours as needed for mild pain, moderate pain or fever. 10/23/21   Pennie Banter, DO  albuterol (VENTOLIN HFA) 108 (90 Base) MCG/ACT inhaler Inhale 2 puffs into the lungs every 6 (six) hours. Patient not taking: Reported on 08/18/2022 07/21/22   Alford Highland, MD  chlorhexidine (HIBICLENS) 4 % external liquid Apply topically daily as needed. 06/17/22   Michiel Cowboy A, PA-C  ciprofloxacin (CIPRO) 250 MG tablet Take 1 tablet (250 mg total) by mouth 2 (two) times daily. Patient not taking: Reported on 09/05/2022 08/21/22   Michiel Cowboy A, PA-C  fluticasone (FLONASE) 50 MCG/ACT nasal spray 2 sprays in both nostrils    [provider]  polyethylene glycol powder (GLYCOLAX/MIRALAX) 17 GM/SCOOP powder Take 17 g by mouth daily. 10/23/21   [provider]  Vitamin D, Ergocalciferol, (DRISDOL) 1.25 MG (50000 UNIT) CAPS capsule Take 50,000 Units by mouth every 7 (seven) days. Patient not taking: Reported on 09/05/2022    [provider]   Recent Labs    09/05/22 1639  WBC 8.2  HGB 14.3  HCT 44.1  PLT 196  K 3.9  CL 110  CO2 28  BUN 16  CREATININE 1.40*  GLUCOSE 102*  CALCIUM 9.0  INR 1.0   CT Hip Left Wo Contrast  Result Date: 09/05/2022 CLINICAL DATA:  Hip fracture preop planning EXAM: CT OF THE LEFT HIP WITHOUT CONTRAST TECHNIQUE: Multidetector CT imaging of the left hip was performed according to the standard protocol. Multiplanar CT image reconstructions were also generated. RADIATION DOSE REDUCTION: This exam was performed according to the departmental dose-optimization program which includes automated exposure control, adjustment of  the mA and/or kV according to patient size and/or use of iterative reconstruction technique. COMPARISON:  Radiographs earlier today FINDINGS: Bones/Joint/Cartilage Acute comminuted mildly displaced left femur intertrochanteric fracture. There is approximately 1.5 cm of shortening with the femoral shaft displaced superiorly and laterally. The femoral head remains located in the acetabulum. The lesser trochanter is displaced medially. Remote posttraumatic change to the left inferior pubic ramus. Ligaments Suboptimally assessed by CT. Muscles and Tendons Mild edema within the gluteus minimus and medius. Soft tissues Colonic diverticulosis without diverticulitis. IMPRESSION: Intertrochanteric fracture of the left femur as described. Electronically Signed   By: Minerva Fester M.D.   On: 09/05/2022 18:43   CT Head Wo Contrast  Result Date: 09/05/2022 CLINICAL DATA:  88 year old female with head and neck injury from fall. History of normal pressure hydrocephalus.  EXAM: CT HEAD WITHOUT CONTRAST CT CERVICAL SPINE WITHOUT CONTRAST TECHNIQUE: Multidetector CT imaging of the head and cervical spine was performed following the standard protocol without intravenous contrast. Multiplanar CT image reconstructions of the cervical spine were also generated. RADIATION DOSE REDUCTION: This exam was performed according to the departmental dose-optimization program which includes automated exposure control, adjustment of the mA and/or kV according to patient size and/or use of iterative reconstruction technique. COMPARISON:  07/07/2022 and prior CTs FINDINGS: CT HEAD FINDINGS Brain: There is no evidence of acute infarct, hemorrhage, extra-axial collection, midline shift or mass lesion/mass effect. Moderate ventriculomegaly, periventricular hypodensities and atrophy are unchanged. Vascular: Carotid atherosclerotic calcifications are noted. Skull: Normal. Negative for fracture or focal lesion. Sinuses/Orbits: No acute finding. Other: Mild LEFT forehead soft tissue swelling noted. CT CERVICAL SPINE FINDINGS Alignment: Reversal of the normal cervical lordosis again noted without evidence of subluxation. Skull base and vertebrae: No acute fracture. No primary bone lesion or focal pathologic process. Soft tissues and spinal canal: No prevertebral fluid or swelling. No visible canal hematoma. Disc levels: Mild to moderate degenerative disc disease/spondylosis from C3-C7 again noted contributing to central spinal and bony foraminal narrowing at multiple levels. Upper chest: No acute abnormality Other: None IMPRESSION: 1. No evidence of acute intracranial abnormality. Unchanged moderate ventriculomegaly, periventricular hypodensities and atrophy. 2. Mild LEFT forehead soft tissue swelling without fracture. 3. No evidence of acute injury to the cervical spine. Degenerative changes as described. Electronically Signed   By: Harmon Pier M.D.   On: 09/05/2022 18:41   CT Cervical Spine Wo  Contrast  Result Date: 09/05/2022 CLINICAL DATA:  88 year old female with head and neck injury from fall. History of normal pressure hydrocephalus. EXAM: CT HEAD WITHOUT CONTRAST CT CERVICAL SPINE WITHOUT CONTRAST TECHNIQUE: Multidetector CT imaging of the head and cervical spine was performed following the standard protocol without intravenous contrast. Multiplanar CT image reconstructions of the cervical spine were also generated. RADIATION DOSE REDUCTION: This exam was performed according to the departmental dose-optimization program which includes automated exposure control, adjustment of the mA and/or kV according to patient size and/or use of iterative reconstruction technique. COMPARISON:  07/07/2022 and prior CTs FINDINGS: CT HEAD FINDINGS Brain: There is no evidence of acute infarct, hemorrhage, extra-axial collection, midline shift or mass lesion/mass effect. Moderate ventriculomegaly, periventricular hypodensities and atrophy are unchanged. Vascular: Carotid atherosclerotic calcifications are noted. Skull: Normal. Negative for fracture or focal lesion. Sinuses/Orbits: No acute finding. Other: Mild LEFT forehead soft tissue swelling noted. CT CERVICAL SPINE FINDINGS Alignment: Reversal of the normal cervical lordosis again noted without evidence of subluxation. Skull base and vertebrae: No acute fracture. No primary bone lesion or focal pathologic process. Soft  tissues and spinal canal: No prevertebral fluid or swelling. No visible canal hematoma. Disc levels: Mild to moderate degenerative disc disease/spondylosis from C3-C7 again noted contributing to central spinal and bony foraminal narrowing at multiple levels. Upper chest: No acute abnormality Other: None IMPRESSION: 1. No evidence of acute intracranial abnormality. Unchanged moderate ventriculomegaly, periventricular hypodensities and atrophy. 2. Mild LEFT forehead soft tissue swelling without fracture. 3. No evidence of acute injury to the  cervical spine. Degenerative changes as described. Electronically Signed   By: Harmon Pier M.D.   On: 09/05/2022 18:41   DG Chest 1 View  Result Date: 09/05/2022 CLINICAL DATA:  Hip fracture requiring operative repair; chest pain EXAM: CHEST  1 VIEW COMPARISON:  Radiographs 07/07/2022 FINDINGS: Abandoned pacer leads are unchanged. Leadless pacer. Increased size of the cardiomediastinal silhouette likely due to differences in technique. Chronic bronchitic changes and interstitial thickening. Left lower lung infiltrates or atelectasis. Question minimally displaced right lateral third rib fracture versus projection artifact. IMPRESSION: Left lower lung atelectasis or infiltrates. Question minimally displaced right lateral third rib fracture versus projection artifact. Recommend correlation with site of pain and consider CT for further evaluation. Electronically Signed   By: Minerva Fester M.D.   On: 09/05/2022 17:39   DG Hip Unilat W or Wo Pelvis 2-3 Views Left  Result Date: 09/05/2022 CLINICAL DATA:  Fall with hip fracture requiring repair EXAM: DG HIP (WITH OR WITHOUT PELVIS) 2-3V LEFT COMPARISON:  Radiographs 01/30/2012 FINDINGS: Acute intertrochanteric fracture of the proximal left femur with superior and lateral displacement of the distal femoral shaft. The lesser trochanter is displaced medially. The left femoral head remains located in the acetabulum. Question additional nondisplaced fracture of the medial wall of the left acetabulum. Remote posttraumatic change about the right superior and inferior pubic rami. Degenerative changes pubic symphysis, both hips, SI joints and lower lumbar spine. Demineralization. IMPRESSION: Acute intertrochanteric fracture of the left femur. Question additional nondisplaced fracture of the medial wall of the left acetabulum. Consider CT for further evaluation. Electronically Signed   By: Minerva Fester M.D.   On: 09/05/2022 17:34     Positive ROS: All other systems  have been reviewed and were otherwise negative with the exception of those mentioned in the HPI and as above.  Physical Exam: BP (!) 127/57   Pulse 62   Temp 99.3 F (37.4 C) (Temporal)   Resp 18   Ht  (1.702 m)   Wt 77.1 kg   SpO2 92%   BMI 26.63 kg/m  General:  Alert, no acute distress, responds to commands Psychiatric:  Patient is NOT competent for consent, non-agitated Cardiovascular:  No pedal edema, regular rate and rhythm Respiratory:  No wheezing, non-labored breathing   Orthopedic Exam:  LLE: + DF/PF/EHL SILT grossly over foot Foot wwp +Log roll/axial load   X-rays:  As above: L intertrochanteric hip fracture  Assessment/Plan: Kerri [name] is a 88 y.o. female with a L intertrochanteric hip fracture  1. I discussed the various treatment options including both surgical and non-surgical management of the fracture with the patient and/or family (medical PoA). We discussed the high risk of perioperative complications due to patient's age, dementia, and other co-morbidities. After discussion of risks, benefits, and alternatives to surgery, the family and/or patient were in agreement to proceed with surgery. The goals of surgery would be to provide adequate pain relief and allow for mobilization. Plan for surgery is L hip cephalomedullary nailing today, 09/06/2022. 2. NPO until OR 3. Hold anticoagulation  in advance of OR   Signa Kell   09/06/2022 2:15 PM

## 2022-09-06 NOTE — Progress Notes (Signed)
Initial Nutrition Assessment  DOCUMENTATION CODES:   Not applicable  INTERVENTION:   -Once diet is advanced, add:   -Ensure Enlive po TID, each supplement provides 350 kcal and 20 grams of protein -MVI with minerals daily  NUTRITION DIAGNOSIS:   Increased nutrient needs related to post-op healing as evidenced by estimated needs.  GOAL:   Patient will meet greater than or equal to 90% of their needs  MONITOR:   PO intake, Supplement acceptance, Diet advancement  REASON FOR ASSESSMENT:   Consult Assessment of nutrition requirement/status, Hip fracture protocol  ASSESSMENT:   Pt with history of arthitis and dementia presents after a fall  Pt admitted with closed lt hip fracture.   Reviewed I/O's: +100 ml x 24 hours   Per orthopedics notes, plan for IM nailing today. Pt currently NPO for procedure.   Pt unavailable at time of visit. Pt in OR at time of procedure. RD unable to obtain further nutrition-related history or complete nutrition-focused physical exam at this time.    Reviewed wt hx; wt has been stable over the past 6 months.   Per previous notes, pt has history of moderate malnutrition in the context of chronic illness. Suspect diagnosis is ongoing. Pt would benefit from addition of oral nutrition supplements.   Medications reviewed and include colace.   Labs reviewed: CBGS: 132.   Diet Order:   Diet Order             Diet NPO time specified  Diet effective midnight                   EDUCATION NEEDS:   No education needs have been identified at this time  Skin:  Skin Assessment: Reviewed RN Assessment  Last BM:  Unknown  Height:   Ht Readings from Last 1 Encounters:  09/06/22 5\' 7"  (1.702 m)    Weight:   Wt Readings from Last 1 Encounters:  09/06/22 77.1 kg    Ideal Body Weight:  61.4 kg  BMI:  Body mass index is 26.63 kg/m.  Estimated Nutritional Needs:   Kcal:  1650-1850  Protein:  80-95 grams  Fluid:  > 1.6  L    09/08/22, RD, LDN, CDCES Registered Dietitian II Certified Diabetes Care and Education Specialist Please refer to [facility] for RD and/or RD on-call/weekend/after hours pager

## 2022-09-06 NOTE — Plan of Care (Signed)

## 2022-09-06 NOTE — Consult Note (Signed)
CARDIOLOGY CONSULT NOTE               Patient ID: Kerri SUMNERS MRN: [id number] DOB/AGE: [birth year] 88 y.o.  Admit date: 09/05/2022 Referring Physician Dr. Irena Cords Primary Physician Debbra Riding Primary Cardiologist Arnoldo Hooker Reason for Consultation preop clearance for hip surgery  HPI: Patient is a 88 year old female with dementia sick sinus syndrome complete heart block permanent pacemaker in place who had a mechanical fall and is preop for hip surgery cardiology consult was recommended for preop clearance the patient has no ongoing active cardiac issues pacemaker appears to be functioning adequately history difficult with the patient because of her dementia.  Does not appear to be in heart failure-unstable angina or any unstable rhythms patient appears to be at acceptable surgical risk for hip surgery  Review of systems complete and found to be negative unless listed above  Review of systems unable to perform because of underlying dementia    Past Medical History:  Diagnosis Date   Arthritis     Past Surgical History:  Procedure Laterality Date   PACEMAKER LEADLESS INSERTION N/A 01/07/2021   Procedure: PACEMAKER LEADLESS INSERTION;  Surgeon: Marcina Millard, MD;  Location: [facility];  Service: Cardiovascular;  Laterality: N/A;   PPM GENERATOR REMOVAL N/A 01/07/2021   Procedure: PPM GENERATOR REMOVAL;  Surgeon: Marcina Millard, MD;  Location: [facility];  Service: Cardiovascular;  Laterality: N/A;    (Not in a hospital admission)  Social History   Socioeconomic History   Marital status: Widowed    Spouse name: Not on file   Number of children: Not on file   Years of education: Not on file   Highest education level: Not on file  Occupational History   Not on file  Tobacco Use   Smoking status: Never   Smokeless tobacco: Never  Substance and Sexual Activity   Alcohol use: Never   Drug use: Never   Sexual activity: Not on  file  Other Topics Concern   Not on file  Social History Narrative   Not on file   Social Determinants of Health   Financial Resource Strain: Low Risk  (05/01/2019)   Overall Financial Resource Strain (CARDIA)    Difficulty of Paying Living Expenses: Not hard at all  Food Insecurity: No Food Insecurity (05/01/2019)   Hunger Vital Sign    Worried About Running Out of Food in the Last Year: Never true    Ran Out of Food in the Last Year: Never true  Transportation Needs: No Transportation Needs (05/01/2019)   PRAPARE - Administrator, Civil Service (Medical): No    Lack of Transportation (Non-Medical): No  Physical Activity: Insufficiently Active (05/01/2019)   Exercise Vital Sign    Days of Exercise per Week: 7 days    Minutes of Exercise per Session: 20 min  Stress: No Stress Concern Present (05/01/2019)   [employer] of Occupational Health - Occupational Stress Questionnaire    Feeling of Stress : Not at all  Social Connections: Socially Isolated (05/01/2019)   Social Connection and Isolation Panel [NHANES]    Frequency of Communication with Friends and Family: More than three times a week    Frequency of Social Gatherings with Friends and Family: More than three times a week    Attends Religious Services: Never    Database administrator or Organizations: No    Attends Banker Meetings: Never    Marital Status: Widowed  Intimate Partner Violence: Not on file    History reviewed. No pertinent family history.    Review of systems complete and found to be negative unless listed above      PHYSICAL EXAM  General: Well developed, well nourished, in no acute distress HEENT:  Normocephalic and atramatic Neck:  No JVD.  Lungs: Clear bilaterally to auscultation and percussion. Heart: HRRR . Normal S1 and S2 without gallops or murmurs.  Abdomen: Bowel sounds are positive, abdomen soft and non-tender  Msk:  Back normal, normal gait. Normal strength  and tone for age. Extremities: No clubbing, cyanosis or edema.   Neuro: Alert and oriented X 3. Psych:  Good affect, responds appropriately  Labs:   Lab Results  Component Value Date   WBC 8.2 09/05/2022   HGB 14.3 09/05/2022   HCT 44.1 09/05/2022   MCV 86.3 09/05/2022   PLT 196 09/05/2022    Recent Labs  Lab 09/05/22 1639  NA 145  K 3.9  CL 110  CO2 28  BUN 16  CREATININE 1.40*  CALCIUM 9.0  GLUCOSE 102*   Lab Results  Component Value Date   CKTOTAL 73 10/14/2021   TROPONINI < 0.02 05/21/2014    Lab Results  Component Value Date   CHOL 123 05/22/2014   Lab Results  Component Value Date   HDL 35 (L) 05/22/2014   Lab Results  Component Value Date   LDLCALC 65 05/22/2014   Lab Results  Component Value Date   TRIG 116 05/22/2014   No results found for: "CHOLHDL" No results found for: "LDLDIRECT"    Radiology: CT Hip Left Wo Contrast  Result Date: 09/05/2022 CLINICAL DATA:  Hip fracture preop planning EXAM: CT OF THE LEFT HIP WITHOUT CONTRAST TECHNIQUE: Multidetector CT imaging of the left hip was performed according to the standard protocol. Multiplanar CT image reconstructions were also generated. RADIATION DOSE REDUCTION: This exam was performed according to the departmental dose-optimization program which includes automated exposure control, adjustment of the mA and/or kV according to patient size and/or use of iterative reconstruction technique. COMPARISON:  Radiographs earlier today FINDINGS: Bones/Joint/Cartilage Acute comminuted mildly displaced left femur intertrochanteric fracture. There is approximately 1.5 cm of shortening with the femoral shaft displaced superiorly and laterally. The femoral head remains located in the acetabulum. The lesser trochanter is displaced medially. Remote posttraumatic change to the left inferior pubic ramus. Ligaments Suboptimally assessed by CT. Muscles and Tendons Mild edema within the gluteus minimus and medius. Soft  tissues Colonic diverticulosis without diverticulitis. IMPRESSION: Intertrochanteric fracture of the left femur as described. Electronically Signed   By: Minerva Fester M.D.   On: 09/05/2022 18:43   CT Head Wo Contrast  Result Date: 09/05/2022 CLINICAL DATA:  88 year old female with head and neck injury from fall. History of normal pressure hydrocephalus. EXAM: CT HEAD WITHOUT CONTRAST CT CERVICAL SPINE WITHOUT CONTRAST TECHNIQUE: Multidetector CT imaging of the head and cervical spine was performed following the standard protocol without intravenous contrast. Multiplanar CT image reconstructions of the cervical spine were also generated. RADIATION DOSE REDUCTION: This exam was performed according to the departmental dose-optimization program which includes automated exposure control, adjustment of the mA and/or kV according to patient size and/or use of iterative reconstruction technique. COMPARISON:  07/07/2022 and prior CTs FINDINGS: CT HEAD FINDINGS Brain: There is no evidence of acute infarct, hemorrhage, extra-axial collection, midline shift or mass lesion/mass effect. Moderate ventriculomegaly, periventricular hypodensities and atrophy are unchanged. Vascular: Carotid atherosclerotic calcifications are noted. Skull: Normal. Negative  for fracture or focal lesion. Sinuses/Orbits: No acute finding. Other: Mild LEFT forehead soft tissue swelling noted. CT CERVICAL SPINE FINDINGS Alignment: Reversal of the normal cervical lordosis again noted without evidence of subluxation. Skull base and vertebrae: No acute fracture. No primary bone lesion or focal pathologic process. Soft tissues and spinal canal: No prevertebral fluid or swelling. No visible canal hematoma. Disc levels: Mild to moderate degenerative disc disease/spondylosis from C3-C7 again noted contributing to central spinal and bony foraminal narrowing at multiple levels. Upper chest: No acute abnormality Other: None IMPRESSION: 1. No evidence of  acute intracranial abnormality. Unchanged moderate ventriculomegaly, periventricular hypodensities and atrophy. 2. Mild LEFT forehead soft tissue swelling without fracture. 3. No evidence of acute injury to the cervical spine. Degenerative changes as described. Electronically Signed   By: Harmon Pier M.D.   On: 09/05/2022 18:41   CT Cervical Spine Wo Contrast  Result Date: 09/05/2022 CLINICAL DATA:  88 year old female with head and neck injury from fall. History of normal pressure hydrocephalus. EXAM: CT HEAD WITHOUT CONTRAST CT CERVICAL SPINE WITHOUT CONTRAST TECHNIQUE: Multidetector CT imaging of the head and cervical spine was performed following the standard protocol without intravenous contrast. Multiplanar CT image reconstructions of the cervical spine were also generated. RADIATION DOSE REDUCTION: This exam was performed according to the departmental dose-optimization program which includes automated exposure control, adjustment of the mA and/or kV according to patient size and/or use of iterative reconstruction technique. COMPARISON:  07/07/2022 and prior CTs FINDINGS: CT HEAD FINDINGS Brain: There is no evidence of acute infarct, hemorrhage, extra-axial collection, midline shift or mass lesion/mass effect. Moderate ventriculomegaly, periventricular hypodensities and atrophy are unchanged. Vascular: Carotid atherosclerotic calcifications are noted. Skull: Normal. Negative for fracture or focal lesion. Sinuses/Orbits: No acute finding. Other: Mild LEFT forehead soft tissue swelling noted. CT CERVICAL SPINE FINDINGS Alignment: Reversal of the normal cervical lordosis again noted without evidence of subluxation. Skull base and vertebrae: No acute fracture. No primary bone lesion or focal pathologic process. Soft tissues and spinal canal: No prevertebral fluid or swelling. No visible canal hematoma. Disc levels: Mild to moderate degenerative disc disease/spondylosis from C3-C7 again noted contributing to  central spinal and bony foraminal narrowing at multiple levels. Upper chest: No acute abnormality Other: None IMPRESSION: 1. No evidence of acute intracranial abnormality. Unchanged moderate ventriculomegaly, periventricular hypodensities and atrophy. 2. Mild LEFT forehead soft tissue swelling without fracture. 3. No evidence of acute injury to the cervical spine. Degenerative changes as described. Electronically Signed   By: Harmon Pier M.D.   On: 09/05/2022 18:41   DG Chest 1 View  Result Date: 09/05/2022 CLINICAL DATA:  Hip fracture requiring operative repair; chest pain EXAM: CHEST  1 VIEW COMPARISON:  Radiographs 07/07/2022 FINDINGS: Abandoned pacer leads are unchanged. Leadless pacer. Increased size of the cardiomediastinal silhouette likely due to differences in technique. Chronic bronchitic changes and interstitial thickening. Left lower lung infiltrates or atelectasis. Question minimally displaced right lateral third rib fracture versus projection artifact. IMPRESSION: Left lower lung atelectasis or infiltrates. Question minimally displaced right lateral third rib fracture versus projection artifact. Recommend correlation with site of pain and consider CT for further evaluation. Electronically Signed   By: Minerva Fester M.D.   On: 09/05/2022 17:39   DG Hip Unilat W or Wo Pelvis 2-3 Views Left  Result Date: 09/05/2022 CLINICAL DATA:  Fall with hip fracture requiring repair EXAM: DG HIP (WITH OR WITHOUT PELVIS) 2-3V LEFT COMPARISON:  Radiographs 01/30/2012 FINDINGS: Acute intertrochanteric fracture of the proximal left  femur with superior and lateral displacement of the distal femoral shaft. The lesser trochanter is displaced medially. The left femoral head remains located in the acetabulum. Question additional nondisplaced fracture of the medial wall of the left acetabulum. Remote posttraumatic change about the right superior and inferior pubic rami. Degenerative changes pubic symphysis, both  hips, SI joints and lower lumbar spine. Demineralization. IMPRESSION: Acute intertrochanteric fracture of the left femur. Question additional nondisplaced fracture of the medial wall of the left acetabulum. Consider CT for further evaluation. Electronically Signed   By: Minerva Festeryler  Stutzman M.D.   On: 09/05/2022 17:34    EKG: Wide-complex rhythm possibly junctional cannot rule out atrial fibrillation or paced beats rate of 60  ASSESSMENT AND PLAN:  Preop hip surgery Sick sinus syndrome permanent pacemaker in place Cardiomyopathy mild Hypertension Renal insufficiency stage III Dementia . Plan Patient appears to be an acceptable surgical risk for hip surgery and is cleared as a mild risk no specific direct changes necessary patient has a pacemaker in place and is far enough away from the surgical site that need not be adjusted nor is a magnet necessary Have the patient follow-up for pacer interrogation as an outpatient Continue current therapy for mild cardiomyopathy Maintain adequate hydration for mild renal insufficiency Continue Aricept and Namenda Effexor for dementia symptoms Correct all electrolytes Okay to continue metoprolol pre and postop No further cardiology recommendations at this point  Signed: Alwyn Peawayne D Lipa Knauff MD 09/06/2022, 9:35 AM

## 2022-09-06 NOTE — Progress Notes (Signed)
PROGRESS NOTE    Kerri [name]  ACZ:[id number] DOB: [birth year] DOA: 09/05/2022 PCP: Wilford Corner, PA-C  Outpatient Specialists: endo, cardiology    Brief Narrative:   Brought in after mechanical fall at home, found to have left intertrochanteric hip fracture.   Assessment & Plan:   Principal Problem:   S/p left hip fracture Active Problems:   Acute lower UTI   Chronic kidney disease, stage 3b (HCC)   CHB (complete heart block) (HCC)   Dementia without behavioral disturbance (HCC)   Hypothyroidism   Essential hypertension   Electrolyte abnormality  # Left intertrochanteric hip fracture Neurovascularly intact. Ortho planning on repair today. - npo for now - gentle fluids - will need dvt ppx, pt/ot consults, ongoing pain-control post-surgery  # HFrEF Compensated. Ef 45-50 earlier this year - cont home metop  # Complete heart block S/p pacemarker - admission provider has consulted cardiology for pre-op clearance  # CKD 3b Cr 1.4 is at baseline - monitor  # Hypothyroid - cont home le  # Osteoporosis Followed by endo, will need close f/u there given this fracture  # Mixed alzheimer and vascular dementia - cont home aricept, memantine  # Recurrent UTI Doesn't appear to be symptomatic - stop abx - cont home trimethoprim ppx - monitor for symptoms   DVT prophylaxis: SCDs Code Status: full code, confirmed with son, did discuss that DNR would be appropriate Family Communication: son updated telephonically 11/27  Level of care: Telemetry Medical Status is: Inpatient Remains inpatient appropriate because: severity of illness    Consultants:  Cardiology, orthopedics  Procedures: pending  Antimicrobials:  Peri-operative    Subjective: Leg pain on left  Objective: Vitals:   09/05/22 1825 09/05/22 2308 09/06/22 0230 09/06/22 0448  BP: (!) 140/114 (!) 127/112 112/70 102/69  Pulse: 67 62 61 63  Resp: 17 19 18 20   Temp: 97.8 F (36.6  C) 97.8 F (36.6 C)  97.8 F (36.6 C)  TempSrc: Oral Oral  Oral  SpO2: 100% 96% 96% 96%  Weight:      Height:        Intake/Output Summary (Last 24 hours) at 09/06/2022 1054 Last data filed at 09/05/2022 2309 Gross per 24 hour  Intake 100 ml  Output --  Net 100 ml   Filed Weights   09/05/22 1628  Weight: 77.1 kg    Examination:  General exam: Appears calm and comfortable  Respiratory system: Clear to auscultation. Respiratory effort normal. Cardiovascular system: S1 & S2 heard, RRR. No JVD, murmurs, rubs, gallops or clicks. No pedal edema. Gastrointestinal system: Abdomen is nondistended, soft and nontender. No organomegaly or masses felt. Normal bowel sounds heard. Central nervous system: Alert and oriented. No focal neurological deficits. Extremities: Symmetric 5 x 5 power. Skin: No rashes, lesions or ulcers Psychiatry: calm    Data Reviewed: I have personally reviewed following labs and imaging studies  CBC: Recent Labs  Lab 09/05/22 1639  WBC 8.2  NEUTROABS 4.5  HGB 14.3  HCT 44.1  MCV 86.3  PLT 196   Basic Metabolic Panel: Recent Labs  Lab 09/05/22 1639  NA 145  K 3.9  CL 110  CO2 28  GLUCOSE 102*  BUN 16  CREATININE 1.40*  CALCIUM 9.0   GFR: Estimated Creatinine Clearance: 29.2 mL/min (A) (by C-G formula based on SCr of 1.4 mg/dL (H)). Liver Function Tests: No results for input(s): "AST", "ALT", "ALKPHOS", "BILITOT", "PROT", "ALBUMIN" in the last 168 hours. No results for input(s): "LIPASE", "AMYLASE"  in the last 168 hours. No results for input(s): "AMMONIA" in the last 168 hours. Coagulation Profile: Recent Labs  Lab 09/05/22 1639  INR 1.0   Cardiac Enzymes: No results for input(s): "CKTOTAL", "CKMB", "CKMBINDEX", "TROPONINI" in the last 168 hours. BNP (last 3 results) No results for input(s): "PROBNP" in the last 8760 hours. HbA1C: No results for input(s): "HGBA1C" in the last 72 hours. CBG: No results for input(s): "GLUCAP" in  the last 168 hours. Lipid Profile: No results for input(s): "CHOL", "HDL", "LDLCALC", "TRIG", "CHOLHDL", "LDLDIRECT" in the last 72 hours. Thyroid Function Tests: No results for input(s): "TSH", "T4TOTAL", "FREET4", "T3FREE", "THYROIDAB" in the last 72 hours. Anemia Panel: No results for input(s): "VITAMINB12", "FOLATE", "FERRITIN", "TIBC", "IRON", "RETICCTPCT" in the last 72 hours. Urine analysis:    Component Value Date/Time   COLORURINE YELLOW (A) 07/07/2022 1705   APPEARANCEUR Hazy (A) 08/18/2022 1429   LABSPEC 1.017 07/07/2022 1705   LABSPEC 1.021 05/22/2014 0953   PHURINE 5.0 07/07/2022 1705   GLUCOSEU Negative 08/18/2022 1429   GLUCOSEU Negative 05/22/2014 0953   HGBUR NEGATIVE 07/07/2022 1705   BILIRUBINUR Negative 08/18/2022 1429   BILIRUBINUR Negative 05/22/2014 0953   KETONESUR NEGATIVE 07/07/2022 1705   PROTEINUR Negative 08/18/2022 1429   PROTEINUR NEGATIVE 07/07/2022 1705   NITRITE Positive (A) 08/18/2022 1429   NITRITE POSITIVE (A) 07/07/2022 1705   LEUKOCYTESUR 1+ (A) 08/18/2022 1429   LEUKOCYTESUR SMALL (A) 07/07/2022 1705   LEUKOCYTESUR 1+ 05/22/2014 0953   Sepsis Labs: @LABRCNTIP (procalcitonin:4,lacticidven:4)  )No results found for this or any previous visit (from the past 240 hour(s)).       Radiology Studies: CT Hip Left Wo Contrast  Result Date: 09/05/2022 CLINICAL DATA:  Hip fracture preop planning EXAM: CT OF THE LEFT HIP WITHOUT CONTRAST TECHNIQUE: Multidetector CT imaging of the left hip was performed according to the standard protocol. Multiplanar CT image reconstructions were also generated. RADIATION DOSE REDUCTION: This exam was performed according to the departmental dose-optimization program which includes automated exposure control, adjustment of the mA and/or kV according to patient size and/or use of iterative reconstruction technique. COMPARISON:  Radiographs earlier today FINDINGS: Bones/Joint/Cartilage Acute comminuted mildly displaced  left femur intertrochanteric fracture. There is approximately 1.5 cm of shortening with the femoral shaft displaced superiorly and laterally. The femoral head remains located in the acetabulum. The lesser trochanter is displaced medially. Remote posttraumatic change to the left inferior pubic ramus. Ligaments Suboptimally assessed by CT. Muscles and Tendons Mild edema within the gluteus minimus and medius. Soft tissues Colonic diverticulosis without diverticulitis. IMPRESSION: Intertrochanteric fracture of the left femur as described. Electronically Signed   By: 09/07/2022 M.D.   On: 09/05/2022 18:43   CT Head Wo Contrast  Result Date: 09/05/2022 CLINICAL DATA:  88 year old female with head and neck injury from fall. History of normal pressure hydrocephalus. EXAM: CT HEAD WITHOUT CONTRAST CT CERVICAL SPINE WITHOUT CONTRAST TECHNIQUE: Multidetector CT imaging of the head and cervical spine was performed following the standard protocol without intravenous contrast. Multiplanar CT image reconstructions of the cervical spine were also generated. RADIATION DOSE REDUCTION: This exam was performed according to the departmental dose-optimization program which includes automated exposure control, adjustment of the mA and/or kV according to patient size and/or use of iterative reconstruction technique. COMPARISON:  07/07/2022 and prior CTs FINDINGS: CT HEAD FINDINGS Brain: There is no evidence of acute infarct, hemorrhage, extra-axial collection, midline shift or mass lesion/mass effect. Moderate ventriculomegaly, periventricular hypodensities and atrophy are unchanged. Vascular: Carotid atherosclerotic calcifications  are noted. Skull: Normal. Negative for fracture or focal lesion. Sinuses/Orbits: No acute finding. Other: Mild LEFT forehead soft tissue swelling noted. CT CERVICAL SPINE FINDINGS Alignment: Reversal of the normal cervical lordosis again noted without evidence of subluxation. Skull base and vertebrae:  No acute fracture. No primary bone lesion or focal pathologic process. Soft tissues and spinal canal: No prevertebral fluid or swelling. No visible canal hematoma. Disc levels: Mild to moderate degenerative disc disease/spondylosis from C3-C7 again noted contributing to central spinal and bony foraminal narrowing at multiple levels. Upper chest: No acute abnormality Other: None IMPRESSION: 1. No evidence of acute intracranial abnormality. Unchanged moderate ventriculomegaly, periventricular hypodensities and atrophy. 2. Mild LEFT forehead soft tissue swelling without fracture. 3. No evidence of acute injury to the cervical spine. Degenerative changes as described. Electronically Signed   By: Harmon PierJeffrey  Hu M.D.   On: 09/05/2022 18:41   CT Cervical Spine Wo Contrast  Result Date: 09/05/2022 CLINICAL DATA:  88 year old female with head and neck injury from fall. History of normal pressure hydrocephalus. EXAM: CT HEAD WITHOUT CONTRAST CT CERVICAL SPINE WITHOUT CONTRAST TECHNIQUE: Multidetector CT imaging of the head and cervical spine was performed following the standard protocol without intravenous contrast. Multiplanar CT image reconstructions of the cervical spine were also generated. RADIATION DOSE REDUCTION: This exam was performed according to the departmental dose-optimization program which includes automated exposure control, adjustment of the mA and/or kV according to patient size and/or use of iterative reconstruction technique. COMPARISON:  07/07/2022 and prior CTs FINDINGS: CT HEAD FINDINGS Brain: There is no evidence of acute infarct, hemorrhage, extra-axial collection, midline shift or mass lesion/mass effect. Moderate ventriculomegaly, periventricular hypodensities and atrophy are unchanged. Vascular: Carotid atherosclerotic calcifications are noted. Skull: Normal. Negative for fracture or focal lesion. Sinuses/Orbits: No acute finding. Other: Mild LEFT forehead soft tissue swelling noted. CT CERVICAL  SPINE FINDINGS Alignment: Reversal of the normal cervical lordosis again noted without evidence of subluxation. Skull base and vertebrae: No acute fracture. No primary bone lesion or focal pathologic process. Soft tissues and spinal canal: No prevertebral fluid or swelling. No visible canal hematoma. Disc levels: Mild to moderate degenerative disc disease/spondylosis from C3-C7 again noted contributing to central spinal and bony foraminal narrowing at multiple levels. Upper chest: No acute abnormality Other: None IMPRESSION: 1. No evidence of acute intracranial abnormality. Unchanged moderate ventriculomegaly, periventricular hypodensities and atrophy. 2. Mild LEFT forehead soft tissue swelling without fracture. 3. No evidence of acute injury to the cervical spine. Degenerative changes as described. Electronically Signed   By: Harmon PierJeffrey  Hu M.D.   On: 09/05/2022 18:41   DG Chest 1 View  Result Date: 09/05/2022 CLINICAL DATA:  Hip fracture requiring operative repair; chest pain EXAM: CHEST  1 VIEW COMPARISON:  Radiographs 07/07/2022 FINDINGS: Abandoned pacer leads are unchanged. Leadless pacer. Increased size of the cardiomediastinal silhouette likely due to differences in technique. Chronic bronchitic changes and interstitial thickening. Left lower lung infiltrates or atelectasis. Question minimally displaced right lateral third rib fracture versus projection artifact. IMPRESSION: Left lower lung atelectasis or infiltrates. Question minimally displaced right lateral third rib fracture versus projection artifact. Recommend correlation with site of pain and consider CT for further evaluation. Electronically Signed   By: Minerva Festeryler  Stutzman M.D.   On: 09/05/2022 17:39   DG Hip Unilat W or Wo Pelvis 2-3 Views Left  Result Date: 09/05/2022 CLINICAL DATA:  Fall with hip fracture requiring repair EXAM: DG HIP (WITH OR WITHOUT PELVIS) 2-3V LEFT COMPARISON:  Radiographs 01/30/2012 FINDINGS: Acute intertrochanteric  fracture of the proximal left femur with superior and lateral displacement of the distal femoral shaft. The lesser trochanter is displaced medially. The left femoral head remains located in the acetabulum. Question additional nondisplaced fracture of the medial wall of the left acetabulum. Remote posttraumatic change about the right superior and inferior pubic rami. Degenerative changes pubic symphysis, both hips, SI joints and lower lumbar spine. Demineralization. IMPRESSION: Acute intertrochanteric fracture of the left femur. Question additional nondisplaced fracture of the medial wall of the left acetabulum. Consider CT for further evaluation. Electronically Signed   By: Minerva Fester M.D.   On: 09/05/2022 17:34        Scheduled Meds:  docusate sodium  100 mg Oral BID   Continuous Infusions:  lactated ringers 50 mL/hr at 09/05/22 2321   methocarbamol (ROBAXIN) IV       LOS: 1 day     Silvano Bilis, MD [facility]   If 7PM-7AM, please contact night-coverage [email or web address] Password [facility] 09/06/2022, 10:54 AM

## 2022-09-07 ENCOUNTER — Encounter: Payer: Self-pay | Admitting: Orthopedic Surgery

## 2022-09-07 DIAGNOSIS — D62 Acute posthemorrhagic anemia: Secondary | ICD-10-CM

## 2022-09-07 DIAGNOSIS — S72142A Displaced intertrochanteric fracture of left femur, initial encounter for closed fracture: Secondary | ICD-10-CM

## 2022-09-07 LAB — CBC
HCT: 32.5 % — ABNORMAL LOW (ref 36.0–46.0)
Hemoglobin: 10.7 g/dL — ABNORMAL LOW (ref 12.0–15.0)
MCH: 28.7 pg (ref 26.0–34.0)
MCHC: 32.9 g/dL (ref 30.0–36.0)
MCV: 87.1 fL (ref 80.0–100.0)
Platelets: 167 10*3/uL (ref 150–400)
RBC: 3.73 MIL/uL — ABNORMAL LOW (ref 3.87–5.11)
RDW: 13.8 % (ref 11.5–15.5)
WBC: 15.5 10*3/uL — ABNORMAL HIGH (ref 4.0–10.5)
nRBC: 0 % (ref 0.0–0.2)

## 2022-09-07 LAB — BASIC METABOLIC PANEL
Anion gap: 8 (ref 5–15)
BUN: 27 mg/dL — ABNORMAL HIGH (ref 8–23)
CO2: 23 mmol/L (ref 22–32)
Calcium: 7.9 mg/dL — ABNORMAL LOW (ref 8.9–10.3)
Chloride: 109 mmol/L (ref 98–111)
Creatinine, Ser: 1.88 mg/dL — ABNORMAL HIGH (ref 0.44–1.00)
GFR, Estimated: 25 mL/min — ABNORMAL LOW (ref >60)
Glucose, Bld: 155 mg/dL — ABNORMAL HIGH (ref 70–99)
Potassium: 4 mmol/L (ref 3.5–5.1)
Sodium: 140 mmol/L (ref 135–145)

## 2022-09-07 MED ORDER — TRAMADOL HCL 50 MG PO TABS: 50.0000 mg | ORAL_TABLET | Freq: Four times a day (QID) | ORAL | 0 refills | Status: DC | PRN

## 2022-09-07 MED ORDER — ENOXAPARIN SODIUM 30 MG/0.3ML IJ SOSY
30.0000 mg | PREFILLED_SYRINGE | INTRAMUSCULAR | Status: DC
  Administered 2022-09-08 – 2022-09-10 (×3): 30 mg via SUBCUTANEOUS
  Filled 2022-09-07 (×3): qty 0.3

## 2022-09-07 MED ORDER — ENOXAPARIN SODIUM 40 MG/0.4ML IJ SOSY: 40.0000 mg | PREFILLED_SYRINGE | INTRAMUSCULAR | 0 refills | Status: DC

## 2022-09-07 MED ORDER — FE FUM-VIT C-VIT B12-FA 460-60-0.01-1 MG PO CAPS
1.0000 | ORAL_CAPSULE | Freq: Every day | ORAL | Status: DC
  Filled 2022-09-07: qty 1

## 2022-09-07 MED ORDER — OXYCODONE HCL 5 MG PO TABS: 2.5000 mg | ORAL_TABLET | ORAL | 0 refills | Status: DC | PRN

## 2022-09-07 NOTE — Progress Notes (Signed)
  Subjective: 1 Day Post-Op Procedure(s) (LRB): INTRAMEDULLARY (IM) NAIL INTERTROCHANTERIC (Left) Patient reports pain as moderate.   Patient is well, and has had no acute complaints or problems Plan is to go Rehab after hospital stay. Negative for chest pain and shortness of breath Fever: no Gastrointestinal: Negative for nausea and vomiting  Objective: Vital signs in last 24 hours: Temp:  [97.2 F (36.2 C)-99.3 F (37.4 C)] 98 F (36.7 C) (11/28 0349) Pulse Rate:  [58-82] 82 (11/28 0349) Resp:  [13-18] 17 (11/28 0349) BP: (92-132)/(36-100) 127/100 (11/28 0349) SpO2:  [90 %-99 %] 91 % (11/28 0349) Weight:  [77.1 kg] 77.1 kg (11/27 1410)  Intake/Output from previous day:  Intake/Output Summary (Last 24 hours) at 09/07/2022 8938 Last data filed at 09/07/2022 0620 Gross per 24 hour  Intake 1284.22 ml  Output 600 ml  Net 684.22 ml    Intake/Output this shift: No intake/output data recorded.  Labs: Recent Labs    09/05/22 1639  HGB 14.3   Recent Labs    09/05/22 1639  WBC 8.2  RBC 5.11  HCT 44.1  PLT 196   Recent Labs    09/05/22 1639  NA 145  K 3.9  CL 110  CO2 28  BUN 16  CREATININE 1.40*  GLUCOSE 102*  CALCIUM 9.0   Recent Labs    09/05/22 1639  INR 1.0     EXAM General - Patient is Alert and Oriented Extremity - Neurovascular intact Sensation intact distally Dorsiflexion/Plantar flexion intact Compartment soft Dressing/Incision - clean, dry, no drainage Motor Function - intact, moving foot and toes well on exam.   Past Medical History:  Diagnosis Date   Arthritis     Assessment/Plan: 1 Day Post-Op Procedure(s) (LRB): INTRAMEDULLARY (IM) NAIL INTERTROCHANTERIC (Left) Principal Problem:   S/p left hip fracture Active Problems:   CHB (complete heart block) (HCC)   Chronic kidney disease, stage 3b (HCC)   Dementia without behavioral disturbance (HCC)   Hypothyroidism   Essential hypertension   Acute lower UTI   Electrolyte  abnormality  Estimated body mass index is 26.63 kg/m as calculated from the following:   Height as of this encounter: 5\' 7"  (1.702 m).   Weight as of this encounter: 77.1 kg. Advance diet Up with therapy  Discharge planning.  Plan to follow-up at [facility] clinic orthopedics in 2 weeks for staple removal and x-rays of the left hip.  DVT Prophylaxis - Lovenox, Foot Pumps, and TED hose Weight-Bearing as tolerated to left leg  [facility], PA-C Orthopaedic Surgery 09/07/2022, 7:12 AM

## 2022-09-07 NOTE — Evaluation (Signed)
Physical Therapy Evaluation Patient Details Name: Kerri [name] MRN: [id number] DOB: 1931/04/07 Today's Date: 09/07/2022  History of Present Illness  Pt is an 88 y.o. female presenting to hospital 11/26 s/p fall; c/o L hip pain.  Pt admitted with L hip fx and acute lower UTI.  Possible minimally displaced R lateral third rib fx vs projection artifact.  S/p IMN of L femur with cephalomedullary device 09/06/22.  PMH includes CHF, complete heart block s/p pacemaker placement, NPH, dementia, htn, CKD, dementia.  Clinical Impression  Prior to hospital admission, pt was ambulatory with walker (pt has 24/7 caregiver assist and always has supervision for mobility but some days also required physical assist); lives alone in 1 level home with steps to enter.  Currently pt is min assist semi-supine to sitting edge of bed; mod assist x2 to stand up to RW; pt unable to ambulate with 2 assist and RW use (see below for details); and mod to max assist x2 for stand pivot transfers.  Pt pleasantly confused during session and did well participating in sessions activities.  Pt would benefit from skilled PT to address noted impairments and functional limitations (see below for any additional details).  Upon hospital discharge, pt would benefit from SNF.   Recommendations for follow up therapy are one component of a multi-disciplinary discharge planning process, led by the attending physician.  Recommendations may be updated based on patient status, additional functional criteria and insurance authorization.  Follow Up Recommendations Skilled nursing-short term rehab (<3 hours/day) Can patient physically be transported by private vehicle: No    Assistance Recommended at Discharge Frequent or constant Supervision/Assistance  Patient can return home with the following  Two people to help with walking and/or transfers;Two people to help with bathing/dressing/bathroom;Assistance with cooking/housework;Assist for  transportation;Help with stairs or ramp for entrance    Equipment Recommendations Rolling walker (2 wheels);BSC/3in1;Wheelchair (measurements PT);Wheelchair cushion (measurements PT)  Recommendations for Other Services       Functional Status Assessment Patient has had a recent decline in their functional status and demonstrates the ability to make significant improvements in function in a reasonable and predictable amount of time.     Precautions / Restrictions Precautions Precautions: Fall Restrictions Weight Bearing Restrictions: Yes LLE Weight Bearing: Weight bearing as tolerated      Mobility  Bed Mobility Overal bed mobility: Needs Assistance Bed Mobility: Supine to Sit     Supine to sit: Min assist, HOB elevated     General bed mobility comments: assist for trunk and L LE    Transfers Overall transfer level: Needs assistance Equipment used: Rolling walker (2 wheels) Transfers: Sit to/from Stand, Bed to chair/wheelchair/BSC Sit to Stand: Mod assist, +2 physical assistance Stand pivot transfers: Mod assist, Max assist, +2 physical assistance         General transfer comment: mod assist x2 to stand from bed up to RW; mod to max assist x2 for stand pivot bed to [facility] (towards L side) and then BSC to recliner (towards R side); vc's for technique    Ambulation/Gait Ambulation/Gait assistance: Total assist Gait Distance (Feet): 0 Feet Assistive device: Rolling walker (2 wheels)         General Gait Details: assist to advance L LE; pt unable to take step with R LE with max cueing for technique and 2 assist (pt's B knees buckling requiring assist to sit back onto bed safely)  Careers information officer  Modified Rankin (Stroke Patients Only)       Balance Overall balance assessment: Needs assistance Sitting-balance support: No upper extremity supported, Feet supported Sitting balance-Leahy Scale: Fair Sitting balance - Comments: steady  static sitting   Standing balance support: Bilateral upper extremity supported, Reliant on assistive device for balance Standing balance-Leahy Scale: Fair Standing balance comment: steady static standing with B UE support on RW                             Pertinent Vitals/Pain Pain Assessment Pain Assessment: Faces Pain Location: L hip/thigh Pain Descriptors / Indicators: Grimacing, Guarding, Tender Pain Intervention(s): Limited activity within patient's tolerance, Monitored during session, Repositioned HR stable and WFL throughout treatment session.  O2 sats 90% on room air so pt placed on 2 L O2 via nasal cannula and O2 sats 95% (nurse notified).    Home Living Family/patient expects to be discharged to:: Private residence Living Arrangements: Alone Available Help at Discharge: Family;Personal care attendant;Available 24 hours/day Type of Home: House Home Access: Stairs to enter   [employer] of Steps: 1 plus 1 step to enter (pt uses walker on steps and always has assist)   Home Layout: One level Home Equipment: Rolling Walker (2 wheels);Grab bars - tub/shower;Tub bench;Adaptive equipment;Grab bars - toilet Additional Comments: Pt has 24/7 caregiver assist    Prior Function Prior Level of Function : Needs assist       Physical Assist : Mobility (physical)     Mobility Comments: Pt ambulatory with walker (someones always around supervising but pt sometimes requires physical assist with mobility depending on the day).  No other major falls in last 6 months per pt's son.       Hand Dominance        Extremity/Trunk Assessment   Upper Extremity Assessment Upper Extremity Assessment: Generalized weakness    Lower Extremity Assessment Lower Extremity Assessment: Generalized weakness;RLE deficits/detail;LLE deficits/detail RLE Deficits / Details: at least 3/5 AROM hip flexion, knee flexion/extension, and DF/PF LLE Deficits / Details: hip flexion at  least 2+/5 (limited d/t hip/thigh pain); knee flexion/extension at least 2+/5 (limited d/t hip/thigh pain); DF/PF at least 3/5 AROM LLE: Unable to fully assess due to pain    Cervical / Trunk Assessment Cervical / Trunk Assessment: Normal  Communication   Communication: No difficulties  Cognition Arousal/Alertness: Awake/alert Behavior During Therapy: WFL for tasks assessed/performed Overall Cognitive Status: History of cognitive impairments - at baseline                                 General Comments: Pt oriented to at least self, location, and that she broke her L hip.  Short term memory deficits noted (pt often repetitive asking same questions over and over again).        General Comments General comments (skin integrity, edema, etc.): mild drainage noted L hip honeycomb dressings.  Nursing cleared pt for participation in physical therapy.  Pt agreeable to PT session.  Pt's son present intermittently during session.  Pt requesting to toilet during session (attempted to get clean catch for urine sample per nursing request but pt only able to have bowel movement).    Exercises  Deferred d/t pt needing to toilet.   Assessment/Plan    PT Assessment Patient needs continued PT services  PT Problem List Decreased strength;Decreased activity tolerance;Decreased balance;Decreased mobility;Decreased knowledge of use of  DME;Decreased knowledge of precautions;Pain;Decreased skin integrity       PT Treatment Interventions DME instruction;Gait training;Stair training;Functional mobility training;Therapeutic activities;Therapeutic exercise;Balance training;Patient/family education    PT Goals (Current goals can be found in the Care Plan section)  Acute Rehab PT Goals Patient Stated Goal: to improve mobility PT Goal Formulation: With patient/family Time For Goal Achievement: 09/21/22 Potential to Achieve Goals: Good    Frequency BID     Co-evaluation                AM-PAC PT "6 Clicks" Mobility  Outcome Measure Help needed turning from your back to your side while in a flat bed without using bedrails?: A Little Help needed moving from lying on your back to sitting on the side of a flat bed without using bedrails?: A Lot Help needed moving to and from a bed to a chair (including a wheelchair)?: Total Help needed standing up from a chair using your arms (e.g., wheelchair or bedside chair)?: Total Help needed to walk in hospital room?: Total Help needed climbing 3-5 steps with a railing? : Total 6 Click Score: 9    End of Session Equipment Utilized During Treatment: Gait belt;Oxygen Activity Tolerance: Patient limited by pain Patient left: in chair;with call bell/phone within reach;with chair alarm set;with family/visitor present;with SCD's reapplied;Other (comment) (B heels floating via pillow support) Nurse Communication: Mobility status;Precautions;Weight bearing status (pt placed back on 2 L O2 via nasal cannula) PT Visit Diagnosis: Other abnormalities of gait and mobility (R26.89);Muscle weakness (generalized) (M62.81);History of falling (Z91.81);Pain Pain - Right/Left: Left Pain - part of body: Hip    Time: [id number] PT Time Calculation (min) (ACUTE ONLY): 41 min   Charges:   PT Evaluation $PT Eval Low Complexity: 1 Low PT Treatments $Therapeutic Activity: 23-37 mins       Cleta Heatley, PT 09/07/22, 1:12 PM

## 2022-09-07 NOTE — Hospital Course (Addendum)
Taken from prior notes.  Kerri [name] is an 88 y.o. female with past medical history significant for dementia, HFrEF (EF 45-50%)hypertension, sick sinus syndrome, complete heart block s/p pacemaker in place, CKD stage IIIb, frequent UTI and hypothyroidism brought to ED after having a mechanical fall which resulted in left intertrochanteric hip fracture. Cardiology was consulted for presurgical clearance. Orthopedic surgery was consulted and she was taken to the OR on 09/06/2022 for ORIF.  11/28: Hemodynamically stable, CBC with leukocytosis at 15.5, most likely reactive, hemoglobin decreased to 10.7 postoperatively from 14.3 on admission, AKI with BUN of 27 and creatinine 1.88, baseline appears to be around 1.2-1.4.  11/29: Hemodynamically stable, labs with further decrease of hemoglobin to 8.9, improving leukocytosis, anemia panel with anemia of chronic disease with iron deficiency. Renal function started improving.  Patient with excessive sedation after getting pain medications during day, will avoid sedating medications. Continuing IV fluid for another day. Pending insurance authorization for rehab.  11/30: Patient was very lethargic and somnolent when seen today.  Apparently she has not eaten anything since yesterday and hardly opening eyes.  Stat ABG with no hypercarbia, mild hypoxia.  CT head and chest x-ray without any acute abnormality.  CBG 195.  CBC and BMP seems stable with improvement in renal function and hemoglobin. Later patient becomes little more responsive and able to participate with PT and OT.  12/1: Patient more alert and tolerating diet well.  She was able to work with PT although feeling very weak and having pain with ambulation.  No nausea or vomiting.  Recheck procalcitonin yesterday to rule out any bacterial infection and it was negative. Patient to avoid opioids especially with the muscle relaxant as most likely the cause of her hypersomnolent and lethargy for more  than 24 hours. She was giving tramadol instead of Oxy for pain.  Should try her Tylenol first followed by tramadol if needed.  Patient otherwise medically stable and is being discharged to rehab for further management.  She will continue on current medications and need to have a close follow-up with her providers for further recommendations.

## 2022-09-07 NOTE — Progress Notes (Signed)
Physical Therapy Treatment Patient Details Name: Kerri [name] MRN: [id number] DOB: [birth year] Today's Date: 09/07/2022   History of Present Illness Pt is an 88 y.o. female presenting to hospital 11/26 s/p fall; c/o L hip pain.  Pt admitted with L hip fx and acute lower UTI.  Possible minimally displaced R lateral third rib fx vs projection artifact.  S/p IMN of L femur with cephalomedullary device 09/06/22.  PMH includes CHF, complete heart block s/p pacemaker placement, NPH, dementia, htn, CKD, dementia.    PT Comments    In chair.  Supine and seated AAROM.  Stood to [employer] with mod a x 2 and overall stands fairly well but unable to step to transfer.  Returned to sitting and stand pivot mod/max a x 2 transfer to bed with knees buckling and guidance for hips.  Returned to supine with mod a x 2 and repositioned for comfort.   Recommendations for follow up therapy are one component of a multi-disciplinary discharge planning process, led by the attending physician.  Recommendations may be updated based on patient status, additional functional criteria and insurance authorization.  Follow Up Recommendations  Skilled nursing-short term rehab (<3 hours/day) Can patient physically be transported by private vehicle: No   Assistance Recommended at Discharge Frequent or constant Supervision/Assistance  Patient can return home with the following Two people to help with walking and/or transfers;Two people to help with bathing/dressing/bathroom;Assistance with cooking/housework;Assist for transportation;Help with stairs or ramp for entrance   Equipment Recommendations  Rolling walker (2 wheels);BSC/3in1;Wheelchair (measurements PT);Wheelchair cushion (measurements PT)    Recommendations for Other Services       Precautions / Restrictions Precautions Precautions: Fall Restrictions Weight Bearing Restrictions: Yes LLE Weight Bearing: Weight bearing as tolerated     Mobility  Bed  Mobility Overal bed mobility: Needs Assistance Bed Mobility: Sit to Supine       Sit to supine: Mod assist, +2 for physical assistance        Transfers Overall transfer level: Needs assistance Equipment used: Rolling walker (2 wheels) Transfers: Sit to/from Stand, Bed to chair/wheelchair/BSC Sit to Stand: Mod assist, +2 physical assistance Stand pivot transfers: Mod assist, Max assist, +2 physical assistance         General transfer comment: unable to step with walker.  walker placed to side and stand pivot to bed.  knees buckle and needs signifant assist from staff to remain upright and assist to move LLE    Ambulation/Gait Ambulation/Gait assistance: Total assist Gait Distance (Feet): 0 Feet Assistive device: Rolling walker (2 wheels)         General Gait Details: assist to advance L LE; pt unable to take step with R LE with max cueing for technique and 2 assist (pt's B knees buckling requiring assist to sit back onto bed safely)   Stairs             Wheelchair Mobility    Modified Rankin (Stroke Patients Only)       Balance Overall balance assessment: Needs assistance Sitting-balance support: No upper extremity supported, Feet supported Sitting balance-Leahy Scale: Fair     Standing balance support: Bilateral upper extremity supported, Reliant on assistive device for balance Standing balance-Leahy Scale: Fair Standing balance comment: steady static standing with B UE support on RW                            Cognition Arousal/Alertness: Awake/alert Behavior During Therapy: [facility] for tasks assessed/performed  Overall Cognitive Status: History of cognitive impairments - at baseline                                          Exercises      General Comments General comments (skin integrity, edema, etc.): mild drainage noted L hip honeycomb dressings      Pertinent Vitals/Pain Pain Assessment Pain Assessment:  Faces Faces Pain Scale: Hurts even more Pain Location: L hip/thigh Pain Descriptors / Indicators: Grimacing, Guarding, Tender Pain Intervention(s): Limited activity within patient's tolerance, Monitored during session, Repositioned    Home Living Family/patient expects to be discharged to:: Private residence Living Arrangements: Alone Available Help at Discharge: Family;Personal care attendant;Available 24 hours/day Type of Home: House Home Access: Stairs to enter   [employer] of Steps: 1 plus 1 step to enter (pt uses walker on steps and always has assist)   Home Layout: One level Home Equipment: Rolling Walker (2 wheels);Grab bars - tub/shower;Tub bench;Adaptive equipment;Grab bars - toilet Additional Comments: Pt has 24/7 caregiver assist    Prior Function            PT Goals (current goals can now be found in the care plan section) Acute Rehab PT Goals Patient Stated Goal: to improve mobility PT Goal Formulation: With patient/family Time For Goal Achievement: 09/21/22 Potential to Achieve Goals: Good Progress towards PT goals: Progressing toward goals    Frequency    BID      PT Plan      Co-evaluation              AM-PAC PT "6 Clicks" Mobility   Outcome Measure  Help needed turning from your back to your side while in a flat bed without using bedrails?: A Little Help needed moving from lying on your back to sitting on the side of a flat bed without using bedrails?: A Lot Help needed moving to and from a bed to a chair (including a wheelchair)?: Total Help needed standing up from a chair using your arms (e.g., wheelchair or bedside chair)?: Total Help needed to walk in hospital room?: Total Help needed climbing 3-5 steps with a railing? : Total 6 Click Score: 9    End of Session Equipment Utilized During Treatment: Gait belt;Oxygen Activity Tolerance: Patient limited by pain Patient left: in chair;with call bell/phone within reach;with  chair alarm set;with family/visitor present;with SCD's reapplied;Other (comment) (B heels floating via pillow support) Nurse Communication: Mobility status;Precautions;Weight bearing status (pt placed back on 2 L O2 via nasal cannula) PT Visit Diagnosis: Other abnormalities of gait and mobility (R26.89);Muscle weakness (generalized) (M62.81);History of falling (Z91.81);Pain Pain - Right/Left: Left Pain - part of body: Hip     Time: 1300-1315 PT Time Calculation (min) (ACUTE ONLY): 15 min  Charges:  $Therapeutic Activity: 8-22 mins                   Danielle Dess, PTA 09/07/22, 1:39 PM

## 2022-09-07 NOTE — Progress Notes (Signed)
Progress Note   Patient: Kerri [name] [id number] DOB: 23-Aug-1931 DOA: 09/05/2022     2 DOS: the patient was seen and examined on 09/07/2022   Brief hospital course: Taken from prior notes.  MAYIA MEGILL is an 88 y.o. female with past medical history significant for dementia, HFrEF (EF 45-50%)hypertension, sick sinus syndrome, complete heart block s/p pacemaker in place, CKD stage IIIb, frequent UTI and hypothyroidism brought to ED after having a mechanical fall which resulted in left intertrochanteric hip fracture. Cardiology was consulted for presurgical clearance. Orthopedic surgery was consulted and she was taken to the OR on 09/06/2022 for ORIF.  11/28: Hemodynamically stable, CBC with leukocytosis at 15.5, most likely reactive, hemoglobin decreased to 10.7 postoperatively from 14.3 on admission, AKI with BUN of 27 and creatinine 1.88, baseline appears to be around 1.2-1.4.   Assessment and Plan: * Displaced intertrochanteric fracture of left femur, initial encounter for closed fracture (HCC) Secondary to mechanical fall, s/p ORIF on 09/06/2022 by orthopedic surgery. PT and OT are recommending SNF. -Continue with pain management and supportive care.   Acute postoperative anemia due to expected blood loss Hemoglobin decreased to 10.7 from 14.3 on admission. Likely secondary to blood loss during surgery. -Monitor hemoglobin -Check anemia panel -Start her on iron supplement  History of recurrent UTI (urinary tract infection) Pt has h/o recurrent uti.  No urinary symptoms.  Recently completed the treatment Urinalysis still pending      Chronic kidney disease, stage 3b (HCC) Little worsening of creatinine after the surgery, at 1.88.  Baseline appears to be at 1.2-1.4. -Giving some gentle IV fluid. -Monitor renal function -Avoid nephrotoxins   CHB (complete heart block) (HCC) S/p pacemaker placement. Cardiology clearance was obtained before surgery.  Chronic  systolic CHF (congestive heart failure) (HCC) Echocardiogram with EF of 45 to 50% earlier this year. Appears well compensated. -Continue to monitor  Dementia without behavioral disturbance (HCC) Cont aricept / namenda and effexor   Hypothyroidism -Continue home Synthroid  Essential hypertension Metoprolol 25 mg daily to be resumed .   Electrolyte abnormality Replace and follow levels.       Subjective: Patient was sitting comfortably in chair when seen today.  Pain seems well-controlled.  No other concern  Physical Exam: Vitals:   09/07/22 0038 09/07/22 0349 09/07/22 0734 09/07/22 1617  BP: 100/60 (!) 127/100 107/67 (!) 115/57  Pulse:  82 84 77  Resp:  17 18 16   Temp:  98 F (36.7 C) 98.1 F (36.7 C) 98.3 F (36.8 C)  TempSrc:      SpO2:  91% 90% 90%  Weight:      Height:       General.  Frail elderly lady, in no acute distress. Pulmonary.  Lungs clear bilaterally, normal respiratory effort. CV.  Regular rate and rhythm, no JVD, rub or murmur. Abdomen.  Soft, nontender, nondistended, BS positive. CNS.  Alert and oriented .  No focal neurologic deficit. Extremities.  No edema, no cyanosis, pulses intact and symmetrical. Psychiatry.  Appears to have some cognitive impairment.  Data Reviewed: Prior data reviewed  Family Communication: Discussed with son at bedside  Disposition: Status is: Inpatient Remains inpatient appropriate because: Severity of illness  Planned Discharge Destination: Skilled nursing facility  DVT prophylaxis.  Lovenox Time spent: 45 minutes  This record has been created using . Errors have been sought and corrected,but may not always be located. Such creation errors do not reflect on the standard of care.  Author: Arnetha Courser, MD 09/07/2022 5:11 PM  For on call review [email or web address].

## 2022-09-07 NOTE — TOC Progression Note (Signed)
Transition of Care [facility]) - Progression Note    Patient Details  Name: Kerri [name] MRN: [id number] Date of Birth: April 28, 1931  Transition of Care [facility] [address]) CM/SW [place], RN Phone Number: 09/07/2022, 3:14 PM  Clinical Narrative:     Met with the patient in the room, she is agreeable to going to SNF, [facility] sent and will review bed offers once obtained, PASSR obtained  Expected Discharge Plan: [place] Barriers to Discharge: SNF Pending bed offer, Insurance Authorization  Expected Discharge Plan and Services Expected Discharge Plan: [place]                                               Social Determinants of Health (SDOH) Interventions    Readmission Risk Interventions     No data to display

## 2022-09-07 NOTE — Discharge Instructions (Signed)
INSTRUCTIONS AFTER Surgery  Remove items at home which could result in a fall. This includes throw rugs or furniture in walking pathways ICE to the affected joint every three hours while awake for 30 minutes at a time, for at least the first 3-5 days, and then as needed for pain and swelling.  Continue to use ice for pain and swelling. You may notice swelling that will progress down to the foot and ankle.  This is normal after surgery.  Elevate your leg when you are not up walking on it.   Continue to use the breathing machine you got in the hospital (incentive spirometer) which will help keep your temperature down.  It is common for your temperature to cycle up and down following surgery, especially at night when you are not up moving around and exerting yourself.  The breathing machine keeps your lungs expanded and your temperature down.   DIET:  As you were doing prior to hospitalization, we recommend a well-balanced diet.  DRESSING / WOUND CARE / SHOWERING  Dressing change as needed.  Staples will be removed in 2 weeks at [facility] clinic orthopedics.  No showering  ACTIVITY  Increase activity slowly as tolerated, but follow the weight bearing instructions below.   No driving for 6 weeks or until further direction given by your physician.  You cannot drive while taking narcotics.  No lifting or carrying greater than 10 lbs. until further directed by your surgeon. Avoid periods of inactivity such as sitting longer than an hour when not asleep. This helps prevent blood clots.  You may return to work once you are authorized by your doctor.     WEIGHT BEARING  Weightbearing as tolerated on the left   EXERCISES Gait training and ambulation training with physical therapy and Occupational Therapy.  CONSTIPATION  Constipation is defined medically as fewer than three stools per week and severe constipation as less than one stool per week.  Even if you have a regular bowel pattern at home,  your normal regimen is likely to be disrupted due to multiple reasons following surgery.  Combination of anesthesia, postoperative narcotics, change in appetite and fluid intake all can affect your bowels.   YOU MUST use at least one of the following options; they are listed in order of increasing strength to get the job done.  They are all available over the counter, and you may need to use some, POSSIBLY even all of these options:    Drink plenty of fluids (prune juice may be helpful) and high fiber foods Colace 100 mg by mouth twice a day  Senokot for constipation as directed and as needed Dulcolax (bisacodyl), take with full glass of water  Miralax (polyethylene glycol) once or twice a day as needed.  If you have tried all these things and are unable to have a bowel movement in the first 3-4 days after surgery call either your surgeon or your primary doctor.    If you experience loose stools or diarrhea, hold the medications until you stool forms back up.  If your symptoms do not get better within 1 week or if they get worse, check with your doctor.  If you experience "the worst abdominal pain ever" or develop nausea or vomiting, please contact the office immediately for further recommendations for treatment.   ITCHING:  If you experience itching with your medications, try taking only a single pain pill, or even half a pain pill at a time.  You can also use  Benadryl over the counter for itching or also to help with sleep.   TED HOSE STOCKINGS:  Use stockings on both legs until for at least 2 weeks or as directed by physician office. They may be removed at night for sleeping.  MEDICATIONS:  See your medication summary on the "After Visit Summary" that nursing will review with you.  You may have some home medications which will be placed on hold until you complete the course of blood thinner medication.  It is important for you to complete the blood thinner medication as  prescribed.  PRECAUTIONS:  If you experience chest pain or shortness of breath - call 911 immediately for transfer to the hospital emergency department.   If you develop a fever greater that 101 F, purulent drainage from wound, increased redness or drainage from wound, foul odor from the wound/dressing, or calf pain - CONTACT YOUR SURGEON.                                                   FOLLOW-UP APPOINTMENTS:  If you do not already have a post-op appointment, please call the office for an appointment to be seen by your surgeon.  Guidelines for how soon to be seen are listed in your "After Visit Summary", but are typically between 1-4 weeks after surgery.  OTHER INSTRUCTIONS:     MAKE SURE YOU:  Understand these instructions.  Get help right away if you are not doing well or get worse.    Thank you for letting us be a part of your medical care team.  It is a privilege we respect greatly.  We hope these instructions will help you stay on track for a fast and full recovery!

## 2022-09-07 NOTE — Assessment & Plan Note (Signed)
Hemoglobin decreased to 10.7 from 14.3 on admission. Likely secondary to blood loss during surgery. -Monitor hemoglobin -Check anemia panel -Start her on iron supplement

## 2022-09-07 NOTE — Assessment & Plan Note (Signed)
Echocardiogram with EF of 45 to 50% earlier this year. Appears well compensated. -Continue to monitor

## 2022-09-07 NOTE — Evaluation (Signed)
Occupational Therapy Evaluation Patient Details Name: Kerri [name] MRN: [id number] DOB: 1930-11-19 Today's Date: 09/07/2022   History of Present Illness Pt is an 88 y.o. female presenting to hospital 11/26 s/p fall; c/o L hip pain.  Pt admitted with L hip fx and acute lower UTI.  Possible minimally displaced R lateral third rib fx vs projection artifact.  S/p IMN of L femur with cephalomedullary device 09/06/22.  PMH includes CHF, complete heart block s/p pacemaker placement, NPH, dementia, htn, CKD, dementia.   Clinical Impression   Patient presenting with decreased independence in self care, balance, functional mobility/transfers, endurance, and safety awareness. Pt with dementia at baseline. PLOF obtained from caregiver/PT evaluation. Pt has 24/7 supervision/assistance at home. Prior to admission, pt required supervision for bathing, assistance for LB dressing PRN, and assistance for all IADLs. Pt oriented to self and grossly to location/situation. Pt currently functioning at Mod A +2 to stand from recliner, Max A for LB dressing, and set up-supervision for seated grooming tasks. NT present to assist with mobility. Pt will benefit from acute OT to increase overall independence in the areas of ADLs and functional mobility in order to safely discharge to next venue of care. Upon hospital discharge, recommend STR to maximize pt safety and return to PLOF.     Recommendations for follow up therapy are one component of a multi-disciplinary discharge planning process, led by the attending physician.  Recommendations may be updated based on patient status, additional functional criteria and insurance authorization.   Follow Up Recommendations  Skilled nursing-short term rehab (<3 hours/day)     Assistance Recommended at Discharge Frequent or constant Supervision/Assistance  Patient can return home with the following Two people to help with bathing/dressing/bathroom;Two people to help with walking  and/or transfers;Assistance with cooking/housework;Assist for transportation;Help with stairs or ramp for entrance;Direct supervision/assist for financial management;Direct supervision/assist for medications management    Functional Status Assessment  Patient has had a recent decline in their functional status and demonstrates the ability to make significant improvements in function in a reasonable and predictable amount of time.  Equipment Recommendations  Other (comment)    Recommendations for Other Services       Precautions / Restrictions Precautions Precautions: Fall Restrictions Weight Bearing Restrictions: Yes LLE Weight Bearing: Weight bearing as tolerated      Mobility Bed Mobility Overal bed mobility: Needs Assistance             General bed mobility comments: not observed, pt in recliner pre/post session    Transfers Overall transfer level: Needs assistance Equipment used: Rolling walker (2 wheels) Transfers: Sit to/from Stand Sit to Stand: Mod assist, +2 physical assistance           General transfer comment: STS from recliner      Balance Overall balance assessment: Needs assistance Sitting-balance support: No upper extremity supported, Feet supported Sitting balance-Leahy Scale: Fair     Standing balance support: Bilateral upper extremity supported, Reliant on assistive device for balance Standing balance-Leahy Scale: Poor                             ADL either performed or assessed with clinical judgement   ADL Overall ADL's : Needs assistance/impaired Eating/Feeding: Set up;Sitting   Grooming: Set up;Sitting;Wash/dry face;Brushing hair               Lower Body Dressing: Maximal assistance;Sitting/lateral leans   Toilet Transfer: Moderate assistance;+2 for physical assistance;Rolling walker (  2 wheels) Toilet Transfer Details (indicate cue type and reason): simulated with STS from recliner                 Vision  Baseline Vision/History: 1 Wears glasses Patient Visual Report: No change from baseline       Perception     Praxis      Pertinent Vitals/Pain Pain Assessment Pain Assessment: 0-10 Pain Score: 5  Pain Location: L hip/thigh Pain Descriptors / Indicators: Grimacing, Guarding, Tender Pain Intervention(s): Limited activity within patient's tolerance, Monitored during session, Repositioned     Hand Dominance Right   Extremity/Trunk Assessment Upper Extremity Assessment Upper Extremity Assessment: Generalized weakness   Lower Extremity Assessment Lower Extremity Assessment: Generalized weakness;LLE deficits/detail RLE Deficits / Details: at least 3/5 AROM hip flexion, knee flexion/extension, and DF/PF LLE Deficits / Details: S/p IMN of L femur LLE: Unable to fully assess due to pain   Cervical / Trunk Assessment Cervical / Trunk Assessment: Normal   Communication Communication Communication: No difficulties   Cognition Arousal/Alertness: Awake/alert Behavior During Therapy: WFL for tasks assessed/performed Overall Cognitive Status: History of cognitive impairments - at baseline                                 General Comments: Pt with dementia at baseline. Pt oriented to at least self, location, and that she broke her L hip.  Short term memory deficits noted (pt often repetitive asking same questions over and over again).     General Comments      Exercises Other Exercises Other Exercises: OT provided education re: role of OT, OT POC, post acute recs, sitting up for all meals, EOB/OOB mobility with assistance, home/fall safety, LLE WBAT   Shoulder Instructions      Home Living Family/patient expects to be discharged to:: Private residence Living Arrangements: Alone Available Help at Discharge: Family;Personal care attendant;Available 24 hours/day Type of Home: House Home Access: Stairs to enter [employer] of Steps: 1 plus 1 step to enter  (pt uses walker on steps and always has assist)   Home Layout: One level     Bathroom Shower/Tub: Teacher, early years/pre: Handicapped height     Home Equipment: Conservation officer, nature (2 wheels);Grab bars - tub/shower;Tub bench;Adaptive equipment;Grab bars - toilet;Hand held shower head Adaptive Equipment: Other (Comment) (Hand held shower head) Additional Comments: Pt has 24/7 caregiver assist. Hired caregiver comes 6 days/wk from 8-5, family fills in the other available times.       Prior Functioning/Environment Prior Level of Function : Needs assist       Physical Assist : Mobility (physical);ADLs (physical)   ADLs (physical): Bathing;Dressing;IADLs Mobility Comments: Pt ambulatory with walker (someones always around supervising but pt sometimes requires physical assist with mobility depending on the day).  No other major falls in last 6 months per pt's son. ADLs Comments: family/caregiver provides supervision for bathing, assist for LB dressing PRN, and all IADLs (medication management, meals, laundry, driving)        OT Problem List: Decreased strength;Decreased range of motion;Decreased activity tolerance;Impaired balance (sitting and/or standing);Decreased knowledge of use of DME or AE;Decreased knowledge of precautions;Decreased cognition;Decreased safety awareness;Pain      OT Treatment/Interventions: Self-care/ADL training;Therapeutic exercise;Therapeutic activities;Cognitive remediation/compensation;DME and/or AE instruction;Patient/family education;Balance training    OT Goals(Current goals can be found in the care plan section) Acute Rehab OT Goals Patient Stated Goal: none stated OT Goal Formulation: With  family Time For Goal Achievement: 09/21/22 Potential to Achieve Goals: Fair   OT Frequency: Min 2X/week    Co-evaluation              AM-PAC OT "6 Clicks" Daily Activity     Outcome Measure Help from another person eating meals?: A Little Help  from another person taking care of personal grooming?: A Little Help from another person toileting, which includes using toliet, bedpan, or urinal?: A Lot Help from another person bathing (including washing, rinsing, drying)?: A Lot Help from another person to put on and taking off regular upper body clothing?: A Little Help from another person to put on and taking off regular lower body clothing?: A Lot 6 Click Score: 15   End of Session Equipment Utilized During Treatment: Gait belt;Rolling walker (2 wheels) Nurse Communication: Mobility status  Activity Tolerance: Patient limited by fatigue;Patient limited by pain Patient left: in chair;with call bell/phone within reach;with chair alarm set;with family/visitor present  OT Visit Diagnosis: Other abnormalities of gait and mobility (R26.89);Other symptoms and signs involving cognitive function;Pain;Muscle weakness (generalized) (M62.81) Pain - Right/Left: Left Pain - part of body: Hip                Time: [id number] OT Time Calculation (min): 22 min Charges:  OT General Charges $OT Visit: 1 Visit OT Evaluation $OT Eval Low Complexity: 1 Low  [facility] MS, OTR/L ascom [phone]  09/07/22, 2:08 PM

## 2022-09-07 NOTE — Anesthesia Postprocedure Evaluation (Signed)
Anesthesia Post Note  Patient: Kerri [name]  Procedure(s) Performed: INTRAMEDULLARY (IM) NAIL INTERTROCHANTERIC (Left: Hip)  Patient location during evaluation: Nursing Unit Anesthesia Type: Spinal Level of consciousness: oriented and awake and alert Pain management: pain level controlled Vital Signs Assessment: post-procedure vital signs reviewed and stable Respiratory status: spontaneous breathing and respiratory function stable Cardiovascular status: blood pressure returned to baseline and stable Postop Assessment: no headache, no backache, no apparent nausea or vomiting and patient able to bend at knees Anesthetic complications: no  No notable events documented.   Last Vitals:  Vitals:   09/07/22 0349 09/07/22 0734  BP: (!) 127/100 107/67  Pulse: 82 84  Resp: 17 18  Temp: 36.7 C 36.7 C  SpO2: 91% 90%    Last Pain:  Vitals:   09/07/22 0349  TempSrc:   PainSc: 0-No pain                 Starling Manns

## 2022-09-07 NOTE — NC FL2 (Signed)
[place] MEDICAID FL2 LEVEL OF CARE SCREENING TOOL     IDENTIFICATION  Patient Name: Kerri [name] Birthdate: [birth year] Sex: female Admission Date (Current Location): 09/05/2022  [facility] and [place] Number:  Chiropodist and Address:  [facility], [address], [place], [place] [address]      Provider Number: [id number]  Attending Physician Name and Address:  Arnetha Courser, MD  Relative Name and Phone Number:  Leonette Most, son [phone]    Current Level of Care: Hospital Recommended Level of Care: Skilled Nursing Facility Prior Approval Number:    Date Approved/Denied:   PASRR Number: [id number]  Discharge Plan: Home    Current Diagnoses: Patient Active Problem List   Diagnosis Date Noted   S/p left hip fracture 09/05/2022   Orthostatic hypotension 07/12/2022   Diarrhea 07/10/2022   COVID-19 virus infection 07/08/2022   Electrolyte abnormality 07/08/2022   Malnutrition of moderate degree 07/08/2022   Acute delirium 07/08/2022   Acute lower UTI 01/30/2022   Constipation    Elevated brain natriuretic peptide (BNP) level 10/15/2021   Generalized weakness 10/15/2021   Chronic kidney disease, stage 3b (HCC) 10/15/2021   Normal pressure hydrocephalus (HCC) 10/15/2021   Dementia without behavioral disturbance (HCC) 10/15/2021   Hypothyroidism 10/15/2021   Essential hypertension 10/15/2021   Overactive bladder 10/15/2021   Moderate mitral regurgitation 01/13/2021   Status post placement of cardiac pacemaker 01/13/2021   CHB (complete heart block) (HCC) 01/07/2021   Chronic systolic CHF (congestive heart failure) (HCC) 12/17/2020   Postmenopausal osteoporosis 08/26/2016   Mixed Alzheimer's and vascular dementia (HCC) 06/13/2014    Orientation RESPIRATION BLADDER Height & Weight     Self, Time, Situation, Place  Normal Continent, External catheter Weight: 77.1 kg Height:  5\' 7"  (170.2 cm)  BEHAVIORAL SYMPTOMS/MOOD  NEUROLOGICAL BOWEL NUTRITION STATUS      Continent Diet (see dc summary)  AMBULATORY STATUS COMMUNICATION OF NEEDS Skin   Extensive Assist Verbally Normal, Surgical wounds                       Personal Care Assistance Level of Assistance  Bathing, Feeding, Dressing Bathing Assistance: Maximum assistance Feeding assistance: Limited assistance Dressing Assistance: Maximum assistance     Functional Limitations Info             SPECIAL CARE FACTORS FREQUENCY  PT (By licensed PT), OT (By licensed OT)     PT Frequency: 5 times per week OT Frequency: 5 times per week            Contractures      Additional Factors Info                  Current Medications (09/07/2022):  This is the current hospital active medication list Current Facility-Administered Medications  Medication Dose Route Frequency Provider Last Rate Last Admin   0.9 %  sodium chloride infusion   Intravenous Continuous 09/09/2022, MD 75 mL/hr at 09/06/22 2018 New Bag at 09/06/22 2018   acetaminophen (TYLENOL) tablet 1,000 mg  1,000 mg Oral Q8H 2019, MD   1,000 mg at 09/07/22 1426   acetaminophen (TYLENOL) tablet 325-650 mg  325-650 mg Oral Q6H PRN 09/09/22, MD       bisacodyl (DULCOLAX) suppository 10 mg  10 mg Rectal Daily PRN Signa Kell, MD   10 mg at 09/07/22 0603   docusate sodium (COLACE) capsule 100 mg  100 mg Oral BID 09/09/22, MD  100 mg at 09/07/22 0828   donepezil (ARICEPT) tablet 10 mg  10 mg Oral QHS Signa Kell, MD   10 mg at 09/06/22 2100   [START ON 09/08/2022] enoxaparin (LOVENOX) injection 30 mg  30 mg Subcutaneous Q24H Nazari, Walid A, RPH       feeding supplement (ENSURE ENLIVE / ENSURE PLUS) liquid 237 mL  237 mL Oral TID BM Wouk, Wilfred Curtis, MD   237 mL at 09/07/22 0830   HYDROmorphone (DILAUDID) injection 0.5-1 mg  0.5-1 mg Intravenous Q4H PRN Signa Kell, MD       levothyroxine (SYNTHROID) tablet 50 mcg  50 mcg Oral Q0600 Signa Kell, MD   50 mcg at  09/07/22 0603   memantine (NAMENDA) tablet 10 mg  10 mg Oral BID Signa Kell, MD   10 mg at 09/07/22 4431   methocarbamol (ROBAXIN) tablet 500 mg  500 mg Oral Q6H PRN Signa Kell, MD       Or   methocarbamol (ROBAXIN) 500 mg in dextrose 5 % 50 mL IVPB  500 mg Intravenous Q6H PRN Signa Kell, MD       metoCLOPramide (REGLAN) tablet 5-10 mg  5-10 mg Oral Q8H PRN Signa Kell, MD       Or   metoCLOPramide (REGLAN) injection 5-10 mg  5-10 mg Intravenous Q8H PRN Signa Kell, MD       metoprolol succinate (TOPROL-XL) 24 hr tablet 25 mg  25 mg Oral Daily Signa Kell, MD   25 mg at 09/07/22 0827   multivitamin with minerals tablet 1 tablet  1 tablet Oral Daily Kathrynn Running, MD   1 tablet at 09/07/22 0827   ondansetron (ZOFRAN) tablet 4 mg  4 mg Oral Q6H PRN Signa Kell, MD       Or   ondansetron [facility]) injection 4 mg  4 mg Intravenous Q6H PRN Signa Kell, MD       oxyCODONE (Oxy IR/ROXICODONE) immediate release tablet 2.5-5 mg  2.5-5 mg Oral Q4H PRN Signa Kell, MD       oxyCODONE (Oxy IR/ROXICODONE) immediate release tablet 5-10 mg  5-10 mg Oral Q4H PRN Signa Kell, MD   5 mg at 09/07/22 5400   senna-docusate (Senokot-S) tablet 1 tablet  1 tablet Oral QHS PRN Signa Kell, MD       sodium phosphate (FLEET) 7-19 GM/118ML enema 1 enema  1 enema Rectal Once PRN Signa Kell, MD       traMADol Janean Sark) tablet 50 mg  50 mg Oral Q6H PRN Signa Kell, MD       trimethoprim (TRIMPEX) tablet 100 mg  100 mg Oral Daily Signa Kell, MD   100 mg at 09/07/22 0955   venlafaxine XR (EFFEXOR-XR) 24 hr capsule 75 mg  75 mg Oral Q breakfast Signa Kell, MD   75 mg at 09/07/22 0827     Discharge Medications: Please see discharge summary for a list of discharge medications.  Relevant Imaging Results:  Relevant Lab Results:   Additional Information SS #: [id number]  Nusrat Encarnacion Seward Carol, RN

## 2022-09-08 DIAGNOSIS — S72142A Displaced intertrochanteric fracture of left femur, initial encounter for closed fracture: Secondary | ICD-10-CM | POA: Diagnosis not present

## 2022-09-08 LAB — URINALYSIS, COMPLETE (UACMP) WITH MICROSCOPIC
Bacteria, UA: NONE SEEN
Bilirubin Urine: NEGATIVE
Glucose, UA: NEGATIVE mg/dL
Ketones, ur: NEGATIVE mg/dL
Nitrite: NEGATIVE
Protein, ur: NEGATIVE mg/dL
Specific Gravity, Urine: 1.021 (ref 1.005–1.030)
pH: 5 (ref 5.0–8.0)

## 2022-09-08 LAB — BASIC METABOLIC PANEL
Anion gap: 6 (ref 5–15)
BUN: 35 mg/dL — ABNORMAL HIGH (ref 8–23)
CO2: 20 mmol/L — ABNORMAL LOW (ref 22–32)
Calcium: 8.4 mg/dL — ABNORMAL LOW (ref 8.9–10.3)
Chloride: 114 mmol/L — ABNORMAL HIGH (ref 98–111)
Creatinine, Ser: 1.58 mg/dL — ABNORMAL HIGH (ref 0.44–1.00)
GFR, Estimated: 31 mL/min — ABNORMAL LOW (ref >60)
Glucose, Bld: 125 mg/dL — ABNORMAL HIGH (ref 70–99)
Potassium: 4.3 mmol/L (ref 3.5–5.1)
Sodium: 140 mmol/L (ref 135–145)

## 2022-09-08 LAB — FOLATE: Folate: 18.8 ng/mL (ref >5.9)

## 2022-09-08 LAB — FERRITIN: Ferritin: 88 ng/mL (ref 11–307)

## 2022-09-08 LAB — VITAMIN B12: Vitamin B-12: 2816 pg/mL — ABNORMAL HIGH (ref 180–914)

## 2022-09-08 LAB — IRON AND TIBC
Iron: 14 ug/dL — ABNORMAL LOW (ref 28–170)
Saturation Ratios: 7 % — ABNORMAL LOW (ref 10.4–31.8)
TIBC: 196 ug/dL — ABNORMAL LOW (ref 250–450)
UIBC: 182 ug/dL

## 2022-09-08 LAB — RETICULOCYTES
Immature Retic Fract: 16.6 % — ABNORMAL HIGH (ref 2.3–15.9)
RBC.: 3.2 MIL/uL — ABNORMAL LOW (ref 3.87–5.11)
Retic Count, Absolute: 74.6 10*3/uL (ref 19.0–186.0)
Retic Ct Pct: 2.3 % (ref 0.4–3.1)

## 2022-09-08 LAB — CBC
HCT: 27.4 % — ABNORMAL LOW (ref 36.0–46.0)
Hemoglobin: 8.9 g/dL — ABNORMAL LOW (ref 12.0–15.0)
MCH: 28 pg (ref 26.0–34.0)
MCHC: 32.5 g/dL (ref 30.0–36.0)
MCV: 86.2 fL (ref 80.0–100.0)
Platelets: 164 10*3/uL (ref 150–400)
RBC: 3.18 MIL/uL — ABNORMAL LOW (ref 3.87–5.11)
RDW: 14.2 % (ref 11.5–15.5)
WBC: 12.5 10*3/uL — ABNORMAL HIGH (ref 4.0–10.5)
nRBC: 0 % (ref 0.0–0.2)

## 2022-09-08 MED ORDER — FE FUM-VIT C-VIT B12-FA 460-60-0.01-1 MG PO CAPS
1.0000 | ORAL_CAPSULE | Freq: Two times a day (BID) | ORAL | Status: DC
  Administered 2022-09-08 – 2022-09-10 (×4): 1 via ORAL
  Filled 2022-09-08 (×5): qty 1

## 2022-09-08 MED ORDER — LOPERAMIDE HCL 2 MG PO CAPS
2.0000 mg | ORAL_CAPSULE | Freq: Once | ORAL | Status: AC
  Administered 2022-09-08: 2 mg via ORAL
  Filled 2022-09-08: qty 1

## 2022-09-08 NOTE — TOC Progression Note (Signed)
Transition of Care [facility]) - Progression Note    Patient Details  Name: Kerri [name] MRN: [id number] Date of Birth: 03/29/1931  [facility] CM/SW Contact  Marlowe Sax, RN Phone Number: 09/08/2022, 2:53 PM  Clinical Narrative:    Ins process stared and pending, ref number [id number]   Expected Discharge Plan: Skilled Nursing Facility Barriers to Discharge: SNF Pending bed offer, Insurance Authorization  Expected Discharge Plan and Services Expected Discharge Plan: Skilled Nursing Facility   Discharge Planning Services: CM Consult                                           Social Determinants of Health (SDOH) Interventions Housing Interventions: Intervention Not Indicated  Readmission Risk Interventions     No data to display

## 2022-09-08 NOTE — Progress Notes (Signed)
Physical Therapy Treatment Patient Details Name: Kerri [name] MRN: [id number] DOB: 08-02-1931 Today's Date: 09/08/2022   History of Present Illness Pt is an 88 y.o. female presenting to hospital 11/26 s/p fall; c/o L hip pain.  Pt admitted with L hip fx and acute lower UTI.  Possible minimally displaced R lateral third rib fx vs projection artifact.  S/p IMN of L femur with cephalomedullary device 09/06/22.  PMH includes CHF, complete heart block s/p pacemaker placement, NPH, dementia, htn, CKD, dementia.    PT Comments    Treatment session attempted at 08:40 this am. Caregiver present, observed assisting patient with breakfast feeding. Patient lethargic, caregiver reports it could be from "oxycodone" patient received earlier. PT returned at 11:34 am, patient found sleeping in bed with all lights off. Patient required increased time to awaken; patient oriented to person, birthday and weekday. Patient disoriented to place, time, situation. Patient reported moderate pain in L hip. Patient remained lethargic throughout session, patient did not become more alert with attempted mobility. Patient required maximum assist for rolling and supine to sit transfer. Once seated at EOB, patient with posterior/right lateral lean, requiring moderate assist to maintain sitting balance. Patient attempted to return to sidelying multiple times, despite attempted cues for LE exercises and sitting tasks at EOB. Patient was very easily distracted from task at hand and fatigued very quickly. Patient was returned to supine with maximum assist. Patient was scooted up in bed and repositioned with moderate assist x 2. SCD's reapplied at end of session, bed alarm engaged. Patient remains most appropriate for D/C to SNF at this time secondary to impaired mobility, decreased ROM/strength, altered mental status, falls risk. Continue to progress as tolerated.    Recommendations for follow up therapy are one component of a  multi-disciplinary discharge planning process, led by the attending physician.  Recommendations may be updated based on patient status, additional functional criteria and insurance authorization.  Follow Up Recommendations    Can patient physically be transported by private vehicle: No   Assistance Recommended at Discharge Frequent or constant Supervision/Assistance  Patient can return home with the following Two people to help with walking and/or transfers;Two people to help with bathing/dressing/bathroom;Assistance with cooking/housework;Assist for transportation;Help with stairs or ramp for entrance   Equipment Recommendations  Rolling walker (2 wheels);BSC/3in1;Wheelchair (measurements PT);Wheelchair cushion (measurements PT)    Recommendations for Other Services       Precautions / Restrictions Precautions Precautions: Fall;Other (comment) (Altered mental status) Restrictions Weight Bearing Restrictions: Yes LLE Weight Bearing: Weight bearing as tolerated (LLE)     Mobility  Bed Mobility Overal bed mobility: Needs Assistance Bed Mobility: Supine to Sit, Sit to Supine     Supine to sit: Max assist, HOB elevated Sit to supine: Max assist   General bed mobility comments: patient required significant verbal and tactile cueing for sequencing of tasks to perform bed mobility. patient very easily distracted and lethargic    Transfers Overall transfer level: Needs assistance   Transfers:  (standing not attempted secondaru)                  Ambulation/Gait Ambulation/Gait assistance:  (did not attempt)                 Stairs             Wheelchair Mobility    Modified Rankin (Stroke Patients Only)       Balance Overall balance assessment: Needs assistance Sitting-balance support: Bilateral upper extremity supported, Feet supported  Sitting balance-Leahy Scale: Poor Sitting balance - Comments:  (maximal cues for core activation, sitting  EOB) Postural control: Posterior lean, Right lateral lean                                  Cognition Arousal/Alertness: Lethargic Behavior During Therapy:  (Confused, easily distracted) Overall Cognitive Status:  (Hx dementia, patient with increased lethargy today)                                 General Comments:  (oriented to person, birthday, day of week. disoriented to place, time, situation.)        Exercises General Exercises - Lower Extremity Ankle Circles/Pumps: AROM, Both, 10 reps, Supine (max verbal cues)    General Comments        Pertinent Vitals/Pain Pain Assessment Pain Assessment: Faces Faces Pain Scale: Hurts even more Pain Location: L hip/thigh Pain Descriptors / Indicators: Grimacing, Guarding, Tender Pain Intervention(s): Premedicated before session, Repositioned, Limited activity within patient's tolerance, Monitored during session    Home Living Family/patient expects to be discharged to:: Unsure Living Arrangements: Alone                      Prior Function            PT Goals (current goals can now be found in the care plan section) Acute Rehab PT Goals Patient Stated Goal:  (patient did not specify) PT Goal Formulation: With patient Time For Goal Achievement: 09/21/22 Potential to Achieve Goals: Fair Progress towards PT goals: Not progressing toward goals - comment (limited by lethargic/altered mental status today)    Frequency    BID      PT Plan Current plan remains appropriate    Co-evaluation              AM-PAC PT "6 Clicks" Mobility   Outcome Measure  Help needed turning from your back to your side while in a flat bed without using bedrails?: A Lot Help needed moving from lying on your back to sitting on the side of a flat bed without using bedrails?: A Lot Help needed moving to and from a bed to a chair (including a wheelchair)?: Total Help needed standing up from a chair using  your arms (e.g., wheelchair or bedside chair)?: Total Help needed to walk in hospital room?: Total Help needed climbing 3-5 steps with a railing? : Total 6 Click Score: 8    End of Session   Activity Tolerance: Patient limited by fatigue;Patient limited by lethargy;Patient limited by pain (altered mental status) Patient left: in bed;with call bell/phone within reach;with SCD's reapplied;with bed alarm set Nurse Communication: Mobility status;Precautions;Weight bearing status PT Visit Diagnosis: Other abnormalities of gait and mobility (R26.89);Muscle weakness (generalized) (M62.81);History of falling (Z91.81);Pain Pain - Right/Left: Left Pain - part of body:  (L hip)     Time: 6440-3474 PT Time Calculation (min) (ACUTE ONLY): 24 min  Charges:  $Therapeutic Activity: 23-37 mins                     Kalman Drape, PT, DPT, CCS, GCS    Sela Hilding 09/08/2022, 2:42 PM

## 2022-09-08 NOTE — Assessment & Plan Note (Addendum)
Hemoglobin decreased to 10.7>>8.9 from 14.3 on admission. Likely secondary to blood loss during surgery. -Monitor hemoglobin -Anemia panel with anemia of chronic disease and some iron deficiency. -Start her on iron supplement

## 2022-09-08 NOTE — Progress Notes (Signed)
Received order to hold all sedating medications

## 2022-09-08 NOTE — Progress Notes (Signed)
Nutrition Follow-up  DOCUMENTATION CODES:   Non-severe (moderate) malnutrition in context of chronic illness  INTERVENTION:   -Continue Ensure Enlive po TID, each supplement provides 350 kcal and 20 grams of protein -Continue MVI with minerals daily  NUTRITION DIAGNOSIS:   Moderate Malnutrition related to chronic illness (dementia) as evidenced by mild fat depletion, mild muscle depletion, moderate muscle depletion.  Ongoing  GOAL:   Patient will meet greater than or equal to 90% of their needs  Progressing   MONITOR:   PO intake, Supplement acceptance  REASON FOR ASSESSMENT:   Consult Assessment of nutrition requirement/status, Hip fracture protocol  ASSESSMENT:   Pt with history of arthitis and dementia presents after a fall  11/27- s/p Intramedullary nailing of Left femur with cephalomedullary device    Reviewed I/O's: -430 ml x 24 hours and +354 ml since admission  UOP: 550 ml x 24 hours   Spoke to pt and caregiver at bedside. Pt very drowsy secondary to medications and deferred most of the history to the caregiver. Pt did not each much breakfast today due to drowsiness, but she typically eats well. Per caregiver pt usually eats 3 meals and 3 snacks per day (Breakfast: fruit and yogurt OR grits and eggs; Lunch: out to eat or chick fil A; Dinner: meat, starch, and vegetable). Snacks ranges from cake to fresh fruits and granola bars.   Pt with no weight loss and has been stable. Per caregivers, pt is sensitive about her weight and typically does not discuss this in front of her UBW is around 165#. Noted pt's exam has improved since last admission.   Discussed importance of good meal and supplement intake to promote healing.   Medications reviewed and include colace.   Labs reviewed: Na: 132 (inpatient orders for glycemic control are none).    NUTRITION - FOCUSED PHYSICAL EXAM:  Flowsheet Row Most Recent Value  Orbital Region No depletion  Upper Arm Region  Mild depletion  Thoracic and Lumbar Region No depletion  Buccal Region Mild depletion  Temple Region Moderate depletion  Clavicle Bone Region Moderate depletion  Clavicle and Acromion Bone Region Moderate depletion  Scapular Bone Region Moderate depletion  Dorsal Hand Mild depletion  Patellar Region Mild depletion  Anterior Thigh Region Mild depletion  Posterior Calf Region Mild depletion  Edema (RD Assessment) None  Hair Reviewed  Eyes Reviewed  Mouth Reviewed  Skin Reviewed  Nails Reviewed       Diet Order:   Diet Order             Diet regular Room service appropriate? Yes; Fluid consistency: Thin  Diet effective now                   EDUCATION NEEDS:   Education needs have been addressed  Skin:  Skin Assessment: Skin Integrity Issues: Skin Integrity Issues:: Incisions Incisions: closed lt hip and lt thigh  Last BM:  09/08/22 (type 6)  Height:   Ht Readings from Last 1 Encounters:  09/06/22 5\' 7"  (1.702 m)    Weight:   Wt Readings from Last 1 Encounters:  09/06/22 77.1 kg    Ideal Body Weight:  61.4 kg  BMI:  Body mass index is 26.63 kg/m.  Estimated Nutritional Needs:   Kcal:  1650-1850  Protein:  80-95 grams  Fluid:  > 1.6 L    09/08/22, RD, LDN, CDCES Registered Dietitian II Certified Diabetes Care and Education Specialist Please refer to [facility] for RD and/or RD  on-call/weekend/after hours pager

## 2022-09-08 NOTE — TOC Progression Note (Signed)
Transition of Care [facility]) - Progression Note    Patient Details  Name: Kerri [name] MRN: [id number] Date of Birth: 1931/10/07  Transition of Care [facility]) CM/SW [place], RN Phone Number: 09/08/2022, 2:13 PM  Clinical Narrative:     Met with the patient and her caregiver in the room, reviewed bed offers again, she chose [facility], I notified Magda Paganini, I explained that she will have to pay the copay up front, they stated understanding  Expected Discharge Plan: [place] Barriers to Discharge: SNF Pending bed offer, Insurance Authorization  Expected Discharge Plan and Services Expected Discharge Plan: [place]   Discharge Planning Services: CM Consult                                           Social Determinants of Health (SDOH) Interventions Housing Interventions: Intervention Not Indicated  Readmission Risk Interventions     No data to display

## 2022-09-08 NOTE — TOC Progression Note (Signed)
Transition of Care [facility]) - Progression Note    Patient Details  Name: Kerri [name] MRN: [id number] Date of Birth: 01/27/1931  Transition of Care [facility]) CM/SW Contact  Marlowe Sax, RN Phone Number: 09/08/2022, 10:31 AM  Clinical Narrative:    Reviewed the bed offers with the patient, I explained that [facility] would require $560 up front for two weeks as she is in he copay days, she was recently at [facility],  She stated she would think about it and tell me in a little bit, I asked if she would like me to call family and she said no Will speak with her in a little while to see if she has decided   Expected Discharge Plan: Skilled Nursing Facility Barriers to Discharge: SNF Pending bed offer, Insurance Authorization  Expected Discharge Plan and Services Expected Discharge Plan: Skilled Nursing Facility   Discharge Planning Services: CM Consult                                           Social Determinants of Health (SDOH) Interventions    Readmission Risk Interventions     No data to display

## 2022-09-08 NOTE — Progress Notes (Signed)
       CROSS COVER NOTE  NAME: Kerri [name] MRN: [id number] DOB : 1931-09-17 ATTENDING PHYSICIAN: Arnetha Courser, MD    Date of Service   09/08/2022   HPI/Events of Note   Medication request received for report of patient having 4 loose bowel movements since suppository given 11/28 around 6AM.  Interventions   Assessment/Plan:  Imodium x1     This document was prepared using Dragon voice recognition software and may include unintentional dictation errors.  Bishop Limbo DNP, MBA, FNP-BC Nurse Practitioner [facility] Pager [phone]

## 2022-09-08 NOTE — Progress Notes (Signed)
Progress Note   Patient: Kerri [name] KDT:[id number] DOB: 11/11/1930 DOA: 09/05/2022     3 DOS: the patient was seen and examined on 09/08/2022   Brief hospital course: Taken from prior notes.  Kerri [name] is an 88 y.o. female with past medical history significant for dementia, HFrEF (EF 45-50%)hypertension, sick sinus syndrome, complete heart block s/p pacemaker in place, CKD stage IIIb, frequent UTI and hypothyroidism brought to ED after having a mechanical fall which resulted in left intertrochanteric hip fracture. Cardiology was consulted for presurgical clearance. Orthopedic surgery was consulted and she was taken to the OR on 09/06/2022 for ORIF.  11/28: Hemodynamically stable, CBC with leukocytosis at 15.5, most likely reactive, hemoglobin decreased to 10.7 postoperatively from 14.3 on admission, AKI with BUN of 27 and creatinine 1.88, baseline appears to be around 1.2-1.4.  11/29: Hemodynamically stable, labs with further decrease of hemoglobin to 8.9, improving leukocytosis, anemia panel with anemia of chronic disease with iron deficiency. Renal function started improving.  Patient with excessive sedation after getting pain medications during day, will avoid sedating medications. Continuing IV fluid for another day. Pending chills authorization for rehab   Assessment and Plan: * Displaced intertrochanteric fracture of left femur, initial encounter for closed fracture (HCC) Secondary to mechanical fall, s/p ORIF on 09/06/2022 by orthopedic surgery. PT and OT are recommending SNF. -Continue with pain management and supportive care.   Acute postoperative anemia due to expected blood loss Hemoglobin decreased to 10.7>>8.9 from 14.3 on admission. Likely secondary to blood loss during surgery. -Monitor hemoglobin -Anemia panel with anemia of chronic disease and some iron deficiency. -Start her on iron supplement  History of recurrent UTI (urinary tract infection) Pt  has h/o recurrent uti.  No urinary symptoms.  Recently completed the treatment Urinalysis still pending      Chronic kidney disease, stage 3b (HCC) Little worsening of creatinine after the surgery, at 1.88.  Baseline appears to be at 1.2-1.4. -Giving some gentle IV fluid. -Monitor renal function -Avoid nephrotoxins   CHB (complete heart block) (HCC) S/p pacemaker placement. Cardiology clearance was obtained before surgery.  Chronic systolic CHF (congestive heart failure) (HCC) Echocardiogram with EF of 45 to 50% earlier this year. Appears well compensated. -Continue to monitor  Dementia without behavioral disturbance (HCC) Cont aricept / namenda and effexor   Hypothyroidism -Continue home Synthroid  Essential hypertension Metoprolol 25 mg daily to be resumed .   Electrolyte abnormality Replace and follow levels.       Subjective: Patient is feeling little weak and fatigued when seen during rounds. Pain seems well-controlled.  Physical Exam: Vitals:   09/07/22 1617 09/07/22 2305 09/08/22 0840 09/08/22 1623  BP: (!) 115/57 108/66 (!) 111/51 (!) 106/50  Pulse: 77 75 65 64  Resp: 16 16  20   Temp: 98.3 F (36.8 C) 97.9 F (36.6 C) 98.2 F (36.8 C) 98.1 F (36.7 C)  TempSrc:    (P) Oral  SpO2: 90% 91% 95% 99%  Weight:      Height:       General.  Frail elderly lady, in no acute distress. Pulmonary.  Lungs clear bilaterally, normal respiratory effort. CV.  Regular rate and rhythm, no JVD, rub or murmur. Abdomen.  Soft, nontender, nondistended, BS positive. CNS.  Alert and oriented .  No focal neurologic deficit. Extremities.  No edema, no cyanosis, pulses intact and symmetrical. Psychiatry.  Appears to have some cognitive impairment  Data Reviewed: Prior data reviewed  Family Communication: Discussed with son at  bedside  Disposition: Status is: Inpatient Remains inpatient appropriate because: Severity of illness  Planned Discharge Destination: Skilled  nursing facility  DVT prophylaxis.  Lovenox Time spent: 40 minutes  This record has been created using Conservation officer, historic buildings. Errors have been sought and corrected,but may not always be located. Such creation errors do not reflect on the standard of care.   Author: Arnetha Courser, MD 09/08/2022 5:21 PM  For on call review [email or web address].

## 2022-09-08 NOTE — Plan of Care (Signed)

## 2022-09-08 NOTE — Progress Notes (Signed)
Pt received 10 mg oxycodone and 500 mg robaxin this am along with her routine medications. She was alert and oriented x3 this am. Now she is lethargic and oriented to self. She does not follow commands. She has been sleeping all day. She has received no other medication since this morning. VS 98.1  64 20 106/50  99% RA.. MD notified of changes via secure chat

## 2022-09-08 NOTE — Progress Notes (Signed)
  Subjective: 2 Days Post-Op Procedure(s) (LRB): INTRAMEDULLARY (IM) NAIL INTERTROCHANTERIC (Left) Patient reports pain as moderate.   Patient is well, and has had no acute complaints or problems Plan is to go Rehab after hospital stay. Negative for chest pain and shortness of breath Fever: no Gastrointestinal: Negative for nausea and vomiting  Objective: Vital signs in last 24 hours: Temp:  [97.9 F (36.6 C)-98.3 F (36.8 C)] 97.9 F (36.6 C) (11/28 2305) Pulse Rate:  [75-84] 75 (11/28 2305) Resp:  [16-18] 16 (11/28 2305) BP: (107-115)/(57-67) 108/66 (11/28 2305) SpO2:  [90 %-91 %] 91 % (11/28 2305)  Intake/Output from previous day:  Intake/Output Summary (Last 24 hours) at 09/08/2022 0731 Last data filed at 09/08/2022 0100 Gross per 24 hour  Intake 120 ml  Output 550 ml  Net -430 ml    Intake/Output this shift: No intake/output data recorded.  Labs: Recent Labs    09/05/22 1639 09/07/22 0625 09/08/22 0336  HGB 14.3 10.7* 8.9*   Recent Labs    09/07/22 0625 09/08/22 0336  WBC 15.5* 12.5*  RBC 3.73* 3.18*  3.20*  HCT 32.5* 27.4*  PLT 167 164   Recent Labs    09/07/22 0625 09/08/22 0336  NA 140 140  K 4.0 4.3  CL 109 114*  CO2 23 20*  BUN 27* 35*  CREATININE 1.88* 1.58*  GLUCOSE 155* 125*  CALCIUM 7.9* 8.4*   Recent Labs    09/05/22 1639  INR 1.0     EXAM General - Patient is Alert and Oriented Extremity - Neurovascular intact Sensation intact distally Dorsiflexion/Plantar flexion intact Compartment soft Dressing/Incision - clean, dry, no drainage Motor Function - difficulty moving foot and toes well on exam.  Pain with motion.  Past Medical History:  Diagnosis Date   Arthritis     Assessment/Plan: 2 Days Post-Op Procedure(s) (LRB): INTRAMEDULLARY (IM) NAIL INTERTROCHANTERIC (Left) Principal Problem:   Displaced intertrochanteric fracture of left femur, initial encounter for closed fracture (HCC) Active Problems:   CHB  (complete heart block) (HCC)   Chronic kidney disease, stage 3b (HCC)   Dementia without behavioral disturbance (HCC)   Hypothyroidism   Essential hypertension   History of recurrent UTI (urinary tract infection)   Chronic systolic CHF (congestive heart failure) (HCC)   Acute postoperative anemia due to expected blood loss  Estimated body mass index is 26.63 kg/m as calculated from the following:   Height as of this encounter: 5\' 7"  (1.702 m).   Weight as of this encounter: 77.1 kg. Advance diet Up with therapy  Discharge planning.  Plan to follow-up at [facility] clinic orthopedics in 2 weeks for staple removal and x-rays of the left hip.  DVT Prophylaxis - Lovenox, Foot Pumps, and TED hose Weight-Bearing as tolerated to left leg  [facility], PA-C Orthopaedic Surgery 09/08/2022, 7:31 AM

## 2022-09-09 ENCOUNTER — Inpatient Hospital Stay: Payer: Medicare Other

## 2022-09-09 DIAGNOSIS — S72142A Displaced intertrochanteric fracture of left femur, initial encounter for closed fracture: Secondary | ICD-10-CM | POA: Diagnosis not present

## 2022-09-09 LAB — BASIC METABOLIC PANEL
Anion gap: 6 (ref 5–15)
BUN: 27 mg/dL — ABNORMAL HIGH (ref 8–23)
CO2: 23 mmol/L (ref 22–32)
Calcium: 8.3 mg/dL — ABNORMAL LOW (ref 8.9–10.3)
Chloride: 112 mmol/L — ABNORMAL HIGH (ref 98–111)
Creatinine, Ser: 1.26 mg/dL — ABNORMAL HIGH (ref 0.44–1.00)
GFR, Estimated: 41 mL/min — ABNORMAL LOW (ref >60)
Glucose, Bld: 134 mg/dL — ABNORMAL HIGH (ref 70–99)
Potassium: 4.7 mmol/L (ref 3.5–5.1)
Sodium: 141 mmol/L (ref 135–145)

## 2022-09-09 LAB — BLOOD GAS, ARTERIAL
Acid-base deficit: 0.2 mmol/L (ref 0.0–2.0)
Bicarbonate: 22.6 mmol/L (ref 20.0–28.0)
FIO2: 0.21 %
O2 Saturation: 96.7 %
Patient temperature: 37
pCO2 arterial: 31 mmHg — ABNORMAL LOW (ref 32–48)
pH, Arterial: 7.47 — ABNORMAL HIGH (ref 7.35–7.45)
pO2, Arterial: 77 mmHg — ABNORMAL LOW (ref 83–108)

## 2022-09-09 LAB — CBC
HCT: 29.1 % — ABNORMAL LOW (ref 36.0–46.0)
Hemoglobin: 9.3 g/dL — ABNORMAL LOW (ref 12.0–15.0)
MCH: 27.8 pg (ref 26.0–34.0)
MCHC: 32 g/dL (ref 30.0–36.0)
MCV: 87.1 fL (ref 80.0–100.0)
Platelets: 214 10*3/uL (ref 150–400)
RBC: 3.34 MIL/uL — ABNORMAL LOW (ref 3.87–5.11)
RDW: 14.4 % (ref 11.5–15.5)
WBC: 14.8 10*3/uL — ABNORMAL HIGH (ref 4.0–10.5)
nRBC: 0 % (ref 0.0–0.2)

## 2022-09-09 LAB — GLUCOSE, CAPILLARY: Glucose-Capillary: 195 mg/dL — ABNORMAL HIGH (ref 70–99)

## 2022-09-09 LAB — PROCALCITONIN: Procalcitonin: <0.1 ng/mL

## 2022-09-09 LAB — TROPONIN I (HIGH SENSITIVITY): Troponin I (High Sensitivity): 16 ng/L (ref <18)

## 2022-09-09 MED ORDER — LACTATED RINGERS IV SOLN: INTRAVENOUS | Status: DC

## 2022-09-09 NOTE — TOC Progression Note (Signed)
Transition of Care [facility]) - Progression Note    Patient Details  Name: Kerri [name] MRN: [id number] Date of Birth: 01/14/1931  Transition of Care [facility]) CM/SW Contact  Marlowe Sax, RN Phone Number: 09/09/2022, 10:47 AM  Clinical Narrative:     Checked Ins status, still pending  Expected Discharge Plan: Skilled Nursing Facility Barriers to Discharge: SNF Pending bed offer, Insurance Authorization  Expected Discharge Plan and Services Expected Discharge Plan: Skilled Nursing Facility   Discharge Planning Services: CM Consult                                           Social Determinants of Health (SDOH) Interventions Housing Interventions: Intervention Not Indicated  Readmission Risk Interventions     No data to display

## 2022-09-09 NOTE — Assessment & Plan Note (Signed)
-   Continue home Synthroid °

## 2022-09-09 NOTE — Progress Notes (Signed)
Physical Therapy Treatment Patient Details Name: Kerri [name] MRN: [id number] DOB: June 05, 1931 Today's Date: 09/09/2022   History of Present Illness Pt is an 88 y.o. female presenting to hospital 11/26 s/p fall; c/o L hip pain.  Pt admitted with L hip fx and acute lower UTI.  Possible minimally displaced R lateral third rib fx vs projection artifact.  S/p IMN of L femur with cephalomedullary device 09/06/22.  PMH includes CHF, complete heart block s/p pacemaker placement, NPH, dementia, htn, CKD.    PT Comments    Patient received with multiple family members present in room. Patient is lethargic, but awake and responsive. She is agreeable to PT/OT session. She required total assist +2 for bed mobility and mod A for static sitting at edge of bed. Patient with right lean in sitting. She requested to return back to supine several times during session. Patient will continue to benefit from PT if able to tolerate. Will continue to follow.         Recommendations for follow up therapy are one component of a multi-disciplinary discharge planning process, led by the attending physician.  Recommendations may be updated based on patient status, additional functional criteria and insurance authorization.  Follow Up Recommendations  Skilled nursing-short term rehab (<3 hours/day) Can patient physically be transported by private vehicle: No   Assistance Recommended at Discharge Frequent or constant Supervision/Assistance  Patient can return home with the following Two people to help with walking and/or transfers;Two people to help with bathing/dressing/bathroom;Assistance with cooking/housework;Assist for transportation;Help with stairs or ramp for entrance   Equipment Recommendations  None recommended by PT;Other (comment) (TBD)    Recommendations for Other Services       Precautions / Restrictions Precautions Precautions: Fall Restrictions Weight Bearing Restrictions: Yes LLE Weight  Bearing: Weight bearing as tolerated     Mobility  Bed Mobility Overal bed mobility: Needs Assistance Bed Mobility: Supine to Sit, Sit to Supine     Supine to sit: +2 for physical assistance, HOB elevated, Total assist Sit to supine: Total assist, +2 for physical assistance   General bed mobility comments: required total assist for supine>< sit this visit due to lethargy, poor initiation of movement, requires assist to sit at edge of bed. Repeatedly asking to lie back down.    Transfers                   General transfer comment: unable to attempted due to poor sitting balance, lethargy    Ambulation/Gait                   Stairs             Wheelchair Mobility    Modified Rankin (Stroke Patients Only)       Balance Overall balance assessment: Needs assistance Sitting-balance support: Bilateral upper extremity supported, Feet supported Sitting balance-Leahy Scale: Poor Sitting balance - Comments: patient leaning to her right in sitting, requiring external support to maintain sitting balance Postural control: Right lateral lean                                  Cognition Arousal/Alertness: Lethargic Behavior During Therapy: WFL for tasks assessed/performed, Flat affect Overall Cognitive Status: History of cognitive impairments - at baseline  Exercises      General Comments        Pertinent Vitals/Pain Pain Assessment Pain Assessment: Faces Faces Pain Scale: Hurts a little bit Breathing: normal Negative Vocalization: none Facial Expression: smiling or inexpressive Body Language: relaxed Consolability: no need to console PAINAD Score: 0 Pain Location: reports bottom hurts initially, then with sitting eob reports back discomfort Pain Descriptors / Indicators: Discomfort Pain Intervention(s): Monitored during session, Repositioned    Home Living                           Prior Function            PT Goals (current goals can now be found in the care plan section) Acute Rehab PT Goals Patient Stated Goal: none stated by family at this time. PT Goal Formulation: Patient unable to participate in goal setting Time For Goal Achievement: 09/21/22 Progress towards PT goals: Not progressing toward goals - comment (due to lethargy)    Frequency    7X/week      PT Plan Frequency needs to be updated    Co-evaluation PT/OT/SLP Co-Evaluation/Treatment: Yes Reason for Co-Treatment: For patient/therapist safety;Necessary to address cognition/behavior during functional activity;To address functional/ADL transfers PT goals addressed during session: Mobility/safety with mobility;Balance        AM-PAC PT "6 Clicks" Mobility   Outcome Measure  Help needed turning from your back to your side while in a flat bed without using bedrails?: Total Help needed moving from lying on your back to sitting on the side of a flat bed without using bedrails?: Total Help needed moving to and from a bed to a chair (including a wheelchair)?: Total Help needed standing up from a chair using your arms (e.g., wheelchair or bedside chair)?: Total Help needed to walk in hospital room?: Total Help needed climbing 3-5 steps with a railing? : Total 6 Click Score: 6    End of Session   Activity Tolerance: Patient limited by fatigue;Patient limited by lethargy Patient left: in bed;with call bell/phone within reach;with SCD's reapplied;with bed alarm set;with family/visitor present Nurse Communication: Mobility status PT Visit Diagnosis: Other abnormalities of gait and mobility (R26.89);Muscle weakness (generalized) (M62.81);History of falling (Z91.81);Pain;Adult, failure to thrive (R62.7) Pain - part of body:  (bottom and back)     Time: 1101-1114 PT Time Calculation (min) (ACUTE ONLY): 13 min  Charges:  $Therapeutic Activity: 8-22 mins                      Kyjuan Gause, PT, GCS 09/09/22,11:43 AM

## 2022-09-09 NOTE — Care Management Important Message (Signed)
Important Message  Patient Details  Name: Kerri [name] MRN: [id number] Date of Birth: [birth year]   Medicare Important Message Given:  Yes     Olegario Messier A Lovelle Deitrick 09/09/2022, 12:00 PM

## 2022-09-09 NOTE — Assessment & Plan Note (Signed)
Secondary to mechanical fall, s/p ORIF on 09/06/2022 by orthopedic surgery. PT and OT are recommending SNF. Patient with increased somnolence and lethargy after getting oxycodone and muscle relaxant yesterday.  All workup which include basic labs, chest x-ray, ABG and CT head was without any acute abnormality. -Continue with pain management and supportive care. -Try avoiding narcotics.

## 2022-09-09 NOTE — Assessment & Plan Note (Addendum)
Pt has h/o recurrent uti.  No urinary symptoms.  Recently completed the treatment Urinalysis with only trace leukocytes.

## 2022-09-09 NOTE — Progress Notes (Signed)
Progress Note   Patient: Kerri [name]:[id number] DOB: Aug 06, 1931 DOA: 09/05/2022     4 DOS: the patient was seen and examined on 09/09/2022   Brief hospital course: Taken from prior notes.  Kerri [name] is an 88 y.o. female with past medical history significant for dementia, HFrEF (EF 45-50%)hypertension, sick sinus syndrome, complete heart block s/p pacemaker in place, CKD stage IIIb, frequent UTI and hypothyroidism brought to ED after having a mechanical fall which resulted in left intertrochanteric hip fracture. Cardiology was consulted for presurgical clearance. Orthopedic surgery was consulted and she was taken to the OR on 09/06/2022 for ORIF.  11/28: Hemodynamically stable, CBC with leukocytosis at 15.5, most likely reactive, hemoglobin decreased to 10.7 postoperatively from 14.3 on admission, AKI with BUN of 27 and creatinine 1.88, baseline appears to be around 1.2-1.4.  11/29: Hemodynamically stable, labs with further decrease of hemoglobin to 8.9, improving leukocytosis, anemia panel with anemia of chronic disease with iron deficiency. Renal function started improving.  Patient with excessive sedation after getting pain medications during day, will avoid sedating medications. Continuing IV fluid for another day. Pending insurance authorization for rehab.  11/30: Patient was very lethargic and somnolent when seen today.  Apparently she has not eaten anything since yesterday and hardly opening eyes.  Stat ABG with no hypercarbia, mild hypoxia.  CT head and chest x-ray without any acute abnormality.  CBG 195.  CBC and BMP seems stable with improvement in renal function and hemoglobin. Later patient becomes little more responsive and able to participate with PT and OT.   Assessment and Plan: * Displaced intertrochanteric fracture of left femur, initial encounter for closed fracture (HCC) Secondary to mechanical fall, s/p ORIF on 09/06/2022 by orthopedic surgery. PT and OT  are recommending SNF. Patient with increased somnolence and lethargy after getting oxycodone and muscle relaxant yesterday.  All workup which include basic labs, chest x-ray, ABG and CT head was without any acute abnormality. -Continue with pain management and supportive care. -Try avoiding narcotics.   Acute postoperative anemia due to expected blood loss Hemoglobin decreased to 10.7>>8.9 >>9.3 from 14.3 on admission. Likely secondary to blood loss during surgery. -Monitor hemoglobin -Anemia panel with anemia of chronic disease and some iron deficiency. -Start her on iron supplement  History of recurrent UTI (urinary tract infection) Pt has h/o recurrent uti.  No urinary symptoms.  Recently completed the treatment Urinalysis with only trace leukocytes.      Chronic kidney disease, stage 3b (HCC) Little worsening of creatinine after the surgery, at 1.88.  Baseline appears to be at 1.2-1.4. -Giving some gentle IV fluid. -Monitor renal function -Avoid nephrotoxins   CHB (complete heart block) (HCC) S/p pacemaker placement. Cardiology clearance was obtained before surgery.  Chronic systolic CHF (congestive heart failure) (HCC) Echocardiogram with EF of 45 to 50% earlier this year. Appears well compensated. -Continue to monitor  Dementia without behavioral disturbance (HCC) Cont aricept / namenda and effexor   Hypothyroidism -Continue home Synthroid  Essential hypertension Metoprolol 25 mg daily to be resumed .   Electrolyte abnormality Replace and follow levels.       Subjective: Patient was very lethargic and somnolent when seen today.  According to daughter and caregiver at bedside she is sleeping most of the time since yesterday after getting oxycodone and muscle relaxant.  Not much p.o. intake either.  Physical Exam: Vitals:   09/08/22 1623 09/08/22 2338 09/09/22 0045 09/09/22 0816  BP: (!) 106/50 (!) 121/57  112/66  Pulse:  64 61  (!) 58  Resp: 20 17  16    Temp: 98.1 F (36.7 C) (!) 100.5 F (38.1 C) 99.2 F (37.3 C) 98.7 F (37.1 C)  TempSrc: (P) Oral  Oral   SpO2: 99% 95%  96%  Weight:      Height:       General.  Lethargic and somnolent elderly lady, in no acute distress. Pulmonary.  Lungs clear bilaterally, normal respiratory effort. CV.  Regular rate and rhythm, no JVD, rub or murmur. Abdomen.  Soft, nontender, nondistended, BS positive. CNS.  Somnolent, following simple commands and no apparent focal deficit Extremities.  No edema, no cyanosis, pulses intact and symmetrical. Psychiatry.  Judgment and insight appears impaired.  Data Reviewed: Prior data reviewed  Family Communication: Discussed with daughter and caregiver at bedside  Disposition: Status is: Inpatient Remains inpatient appropriate because: Severity of illness  Planned Discharge Destination: Skilled nursing facility  DVT prophylaxis.  Lovenox Time spent: 50 minutes  This record has been created using . Errors have been sought and corrected,but may not always be located. Such creation errors do not reflect on the standard of care.   Author: Conservation officer, historic buildings, MD 09/09/2022 2:58 PM  For on call review [email or web address].

## 2022-09-09 NOTE — Plan of Care (Signed)

## 2022-09-09 NOTE — Progress Notes (Signed)
Occupational Therapy Treatment Patient Details Name: Kerri [name] MRN: [id number] DOB: 02/26/1931 Today's Date: 09/09/2022   History of present illness Pt is an 88 y.o. female presenting to hospital 11/26 s/p fall; c/o L hip pain.  Pt admitted with L hip fx and acute lower UTI.  Possible minimally displaced R lateral third rib fx vs projection artifact.  S/p IMN of L femur with cephalomedullary device 09/06/22.  PMH includes CHF, complete heart block s/p pacemaker placement, NPH, dementia, htn, CKD.   OT comments  Pt seen for co-tx with PT this date to optimize outcomes and ADL mobility training. (No charge for OT session as pt unable to tolerate length of session required to split units). Family present, agreeable to OT/PT attempting session despite pt's fatigue. Pt required TOTAL A +2 assist for bed mobility and MAX  A to maintain static sitting balance as she tends to off-weight L hip resulting in posterior and R lateral lean. Attempted to engage in static and dynamic reaching but pt requesting to return to bed several times. Did participate in limited BLE ex with multimodal cues. Endorses discomfort. Once supine, pillow placed under R hip per family request and pt endorsing improved comfort than at start of session. Pt continues to benefit from skilled OT services to maximize safety/return to PLOF. Continue to recommend SNF at this time.    Recommendations for follow up therapy are one component of a multi-disciplinary discharge planning process, led by the attending physician.  Recommendations may be updated based on patient status, additional functional criteria and insurance authorization.    Follow Up Recommendations  Skilled nursing-short term rehab (<3 hours/day)     Assistance Recommended at Discharge Frequent or constant Supervision/Assistance  Patient can return home with the following  Two people to help with bathing/dressing/bathroom;Two people to help with walking and/or  transfers;Assistance with cooking/housework;Assist for transportation;Help with stairs or ramp for entrance;Direct supervision/assist for financial management;Direct supervision/assist for medications management   Equipment Recommendations  Other (comment)    Recommendations for Other Services      Precautions / Restrictions Precautions Precautions: Fall Restrictions Weight Bearing Restrictions: Yes LLE Weight Bearing: Weight bearing as tolerated       Mobility Bed Mobility Overal bed mobility: Needs Assistance Bed Mobility: Supine to Sit, Sit to Supine     Supine to sit: +2 for physical assistance, HOB elevated, Total assist Sit to supine: Total assist, +2 for physical assistance   General bed mobility comments: required total assist for supine>< sit this visit due to lethargy, poor initiation of movement, requires assist to sit at edge of bed. Repeatedly asking to lie back down.    Transfers                         Balance Overall balance assessment: Needs assistance Sitting-balance support: Bilateral upper extremity supported, Feet supported Sitting balance-Leahy Scale: Poor Sitting balance - Comments: patient leaning to her right in sitting, requiring external support to maintain sitting balance Postural control: Right lateral lean                                 ADL either performed or assessed with clinical judgement   ADL Overall ADL's : Needs assistance/impaired     Grooming: Set up;Sitting Grooming Details (indicate cue type and reason): supported sitting  Extremity/Trunk Assessment              Vision       Perception     Praxis      Cognition Arousal/Alertness: Lethargic Behavior During Therapy: WFL for tasks assessed/performed, Flat affect Overall Cognitive Status: History of cognitive impairments - at baseline                                 General  Comments: follows simple commands with cues, improved alertness with upright positioning        Exercises      Shoulder Instructions       General Comments      Pertinent Vitals/ Pain       Pain Assessment Pain Assessment: Faces Faces Pain Scale: Hurts a little bit Pain Location: reports bottom hurts initially, then with sitting eob reports back discomfort Pain Descriptors / Indicators: Discomfort Pain Intervention(s): Monitored during session, Repositioned  Home Living                                          Prior Functioning/Environment              Frequency  Min 2X/week        Progress Toward Goals  OT Goals(current goals can now be found in the care plan section)  Progress towards OT goals: OT to reassess next treatment  Acute Rehab OT Goals Patient Stated Goal: none stated OT Goal Formulation: With family Time For Goal Achievement: 09/21/22 Potential to Achieve Goals: Fair  Plan Discharge plan remains appropriate;Frequency remains appropriate    Co-evaluation    PT/OT/SLP Co-Evaluation/Treatment: Yes Reason for Co-Treatment: For patient/therapist safety;Necessary to address cognition/behavior during functional activity;To address functional/ADL transfers PT goals addressed during session: Mobility/safety with mobility;Balance OT goals addressed during session: ADL's and self-care      AM-PAC OT "6 Clicks" Daily Activity     Outcome Measure   Help from another person eating meals?: A Little Help from another person taking care of personal grooming?: A Little Help from another person toileting, which includes using toliet, bedpan, or urinal?: A Lot Help from another person bathing (including washing, rinsing, drying)?: A Lot Help from another person to put on and taking off regular upper body clothing?: A Little Help from another person to put on and taking off regular lower body clothing?: A Lot 6 Click Score: 15    End of  Session    OT Visit Diagnosis: Other abnormalities of gait and mobility (R26.89);Other symptoms and signs involving cognitive function;Pain;Muscle weakness (generalized) (M62.81) Pain - Right/Left: Left Pain - part of body: Hip   Activity Tolerance Patient limited by fatigue   Patient Left in bed;with call bell/phone within reach;with bed alarm set;with SCD's reapplied;with family/visitor present   Nurse Communication          Time: 9381-8299 OT Time Calculation (min): 13 min  Charges: OT General Charges $OT Visit: 1 Visit  Arman Filter., MPH, MS, OTR/L ascom [phone] 09/09/22, 1:19 PM

## 2022-09-09 NOTE — TOC Progression Note (Signed)
Transition of Care [facility]) - Progression Note    Patient Details  Name: Kerri [name] MRN: [id number] Date of Birth: 1931/02/05  Transition of Care [facility]) CM/SW Contact  Marlowe Sax, RN Phone Number: 09/09/2022, 1:20 PM  Clinical Narrative:    Checked Ins auth status, still pending   Expected Discharge Plan: Skilled Nursing Facility Barriers to Discharge: SNF Pending bed offer, Insurance Authorization  Expected Discharge Plan and Services Expected Discharge Plan: Skilled Nursing Facility   Discharge Planning Services: CM Consult                                           Social Determinants of Health (SDOH) Interventions Housing Interventions: Intervention Not Indicated  Readmission Risk Interventions     No data to display

## 2022-09-10 MED ORDER — ENOXAPARIN SODIUM 30 MG/0.3ML IJ SOSY: 30.0000 mg | PREFILLED_SYRINGE | INTRAMUSCULAR | Status: DC

## 2022-09-10 MED ORDER — BISACODYL 10 MG RE SUPP: 10.0000 mg | Freq: Every day | RECTAL | 0 refills | Status: AC | PRN

## 2022-09-10 MED ORDER — ADULT MULTIVITAMIN W/MINERALS CH: 1.0000 | ORAL_TABLET | Freq: Every day | ORAL | Status: AC

## 2022-09-10 MED ORDER — METHOCARBAMOL 500 MG PO TABS: 500.0000 mg | ORAL_TABLET | Freq: Four times a day (QID) | ORAL | Status: DC | PRN

## 2022-09-10 MED ORDER — DOCUSATE SODIUM 100 MG PO CAPS: 100.0000 mg | ORAL_CAPSULE | Freq: Two times a day (BID) | ORAL | 0 refills | Status: AC

## 2022-09-10 MED ORDER — ENSURE ENLIVE PO LIQD: 237.0000 mL | Freq: Three times a day (TID) | ORAL | 12 refills | Status: DC

## 2022-09-10 MED ORDER — FE FUM-VIT C-VIT B12-FA 460-60-0.01-1 MG PO CAPS: 1.0000 | ORAL_CAPSULE | Freq: Two times a day (BID) | ORAL | 0 refills | Status: AC

## 2022-09-10 NOTE — TOC Progression Note (Signed)
Transition of Care [facility]) - Progression Note    Patient Details  Name: LENISE JR MRN: [id number] Date of Birth: Oct 21, 1930  Transition of Care [facility]) CM/SW Contact  Marlowe Sax, RN Phone Number: 09/10/2022, 10:02 AM  Clinical Narrative:     Ins was approved [place] to [employer] next review date 12/4, [id number] ref number  Expected Discharge Plan: Skilled Nursing Facility Barriers to Discharge: SNF Pending bed offer, Insurance Authorization  Expected Discharge Plan and Services Expected Discharge Plan: Skilled Nursing Facility   Discharge Planning Services: CM Consult                                           Social Determinants of Health (SDOH) Interventions Housing Interventions: Intervention Not Indicated  Readmission Risk Interventions     No data to display

## 2022-09-10 NOTE — Progress Notes (Signed)
Physical Therapy Treatment Patient Details Name: Kerri [name] MRN: [id number] DOB: 1931-08-11 Today's Date: 09/10/2022   History of Present Illness Pt is an 88 y.o. female presenting to hospital 11/26 s/p fall; c/o L hip pain.  Pt admitted with L hip fx and acute lower UTI.  Possible minimally displaced R lateral third rib fx vs projection artifact.  S/p IMN of L femur with cephalomedullary device 09/06/22.  PMH includes CHF, complete heart block s/p pacemaker placement, NPH, dementia, htn, CKD.    PT Comments    Pt pleasant and able to follow most 1-step commands with extra time and cuing.  Pt required total assist with bed mobility tasks and frequent physical assist to maintain static sitting balance secondary to R lateral and posterior lean/instability.  Static sitting balance did respond to weight shifting activities with pt progressing to mostly close SBA for sitting balance by the end of the session.  Pt required +2 total assist to come clear the surface of the bed during transfer attempts and was unable to come to full upright standing position.  Pt will benefit from PT services in a SNF setting upon discharge to safely address deficits listed in patient problem list for decreased caregiver assistance and eventual return to PLOF.     Recommendations for follow up therapy are one component of a multi-disciplinary discharge planning process, led by the attending physician.  Recommendations may be updated based on patient status, additional functional criteria and insurance authorization.  Follow Up Recommendations  Skilled nursing-short term rehab (<3 hours/day) Can patient physically be transported by private vehicle: No   Assistance Recommended at Discharge Frequent or constant Supervision/Assistance  Patient can return home with the following Two people to help with walking and/or transfers;Two people to help with bathing/dressing/bathroom;Assistance with cooking/housework;Assist for  transportation;Help with stairs or ramp for entrance   Equipment Recommendations  None recommended by PT;Other (comment) (TBD)    Recommendations for Other Services       Precautions / Restrictions Precautions Precautions: Fall Restrictions Weight Bearing Restrictions: Yes LLE Weight Bearing: Weight bearing as tolerated     Mobility  Bed Mobility Overal bed mobility: Needs Assistance Bed Mobility: Supine to Sit, Sit to Supine     Supine to sit: +2 for physical assistance, HOB elevated, Total assist Sit to supine: Total assist, +2 for physical assistance   General bed mobility comments: Pt put forth notable effort with sup to/from sit but continued to require total assist for BLE and trunk control    Transfers Overall transfer level: Needs assistance Equipment used: Rolling walker (2 wheels) Transfers: Sit to/from Stand Sit to Stand: Max assist, +2 physical assistance           General transfer comment: Pt required near total assist to come to standing and was unable to come to full upright position    Ambulation/Gait               General Gait Details: Unable   Stairs             Wheelchair Mobility    Modified Rankin (Stroke Patients Only)       Balance Overall balance assessment: Needs assistance, History of Falls Sitting-balance support: Bilateral upper extremity supported, Feet supported Sitting balance-Leahy Scale: Poor     Standing balance support: Bilateral upper extremity supported, Reliant on assistive device for balance Standing balance-Leahy Scale: Poor  Cognition Arousal/Alertness: Awake/alert Behavior During Therapy: WFL for tasks assessed/performed, Flat affect Overall Cognitive Status: History of cognitive impairments - at baseline                                          Exercises Total Joint Exercises Ankle Circles/Pumps: AROM, Strengthening, Both, 5 reps, 10  reps Quad Sets: Strengthening, Left, 10 reps Hip ABduction/ADduction: AAROM, Strengthening, Both, 10 reps Straight Leg Raises: AAROM, Strengthening, Both, 10 reps Other Exercises Other Exercises: Extensive L lateral and anterior weight shifting activities in sitting to address R lateral and posterior instabiltiy    General Comments        Pertinent Vitals/Pain Pain Assessment Pain Assessment: 0-10 Pain Score: 5  Pain Location: L hip Pain Descriptors / Indicators: Sore, Grimacing, Guarding Pain Intervention(s): Premedicated before session, Monitored during session, Repositioned    Home Living                          Prior Function            PT Goals (current goals can now be found in the care plan section) Progress towards PT goals: Progressing toward goals    Frequency    7X/week      PT Plan Current plan remains appropriate    Co-evaluation              AM-PAC PT "6 Clicks" Mobility   Outcome Measure  Help needed turning from your back to your side while in a flat bed without using bedrails?: Total Help needed moving from lying on your back to sitting on the side of a flat bed without using bedrails?: Total Help needed moving to and from a bed to a chair (including a wheelchair)?: Total Help needed standing up from a chair using your arms (e.g., wheelchair or bedside chair)?: Total Help needed to walk in hospital room?: Total Help needed climbing 3-5 steps with a railing? : Total 6 Click Score: 6    End of Session Equipment Utilized During Treatment: Gait belt Activity Tolerance: Patient limited by pain Patient left: in bed;with call bell/phone within reach;with SCD's reapplied;with bed alarm set;with family/visitor present Nurse Communication: Mobility status PT Visit Diagnosis: Other abnormalities of gait and mobility (R26.89);Muscle weakness (generalized) (M62.81);History of falling (Z91.81);Pain;Adult, failure to thrive (R62.7) Pain -  Right/Left: Left     Time: 4696-2952 PT Time Calculation (min) (ACUTE ONLY): 28 min  Charges:  $Therapeutic Exercise: 8-22 mins $Therapeutic Activity: 8-22 mins                     D. Scott Chynna Buerkle PT, DPT 09/10/22, 11:57 AM

## 2022-09-10 NOTE — Progress Notes (Signed)
Report has been called to [employer]. [name],RN was given report and questions were answered appropriately. Will cont to monitor.

## 2022-09-10 NOTE — Plan of Care (Signed)

## 2022-09-10 NOTE — Discharge Summary (Signed)
Physician Discharge Summary   Patient: Kerri FreerBetty W Dowen MRN: [id number] DOB: 17-Jun-1931  Admit date:     09/05/2022  Discharge date: 09/10/22  Discharge Physician: Arnetha CourserSumayya Macguire Holsinger   PCP: Wilford CornerWhitaker, Jason Hestle, PA-C   Recommendations at discharge:  Please obtain CBC and BMP in 1 week Repeat TSH and free T4 in 3 to 4 weeks Please avoid opioids and muscle relaxants as they were causing excessive somnolent to the point that she was unable to take any p.o. and was sleeping more than 24 hours. Please try Tylenol first for pain and if that does not work then you can give her tramadol. Follow-up with orthopedic surgery in 1 to 2 weeks Follow-up with primary care provider.  Discharge Diagnoses: Principal Problem:   Displaced intertrochanteric fracture of left femur, initial encounter for closed fracture [facility](HCC) Active Problems:   Acute postoperative anemia due to expected blood loss   History of recurrent UTI (urinary tract infection)   Chronic kidney disease, stage 3b (HCC)   CHB (complete heart block) (HCC)   Chronic systolic CHF (congestive heart failure) (HCC)   Dementia without behavioral disturbance (HCC)   Hypothyroidism   Essential hypertension   Hospital Course: Taken from prior notes.  Kerri FreerBetty W [name] is an 88 y.o. female with past medical history significant for dementia, HFrEF (EF 45-50%)hypertension, sick sinus syndrome, complete heart block s/p pacemaker in place, CKD stage IIIb, frequent UTI and hypothyroidism brought to ED after having a mechanical fall which resulted in left intertrochanteric hip fracture. Cardiology was consulted for presurgical clearance. Orthopedic surgery was consulted and she was taken to the OR on 09/06/2022 for ORIF.  11/28: Hemodynamically stable, CBC with leukocytosis at 15.5, most likely reactive, hemoglobin decreased to 10.7 postoperatively from 14.3 on admission, AKI with BUN of 27 and creatinine 1.88, baseline appears to be around  1.2-1.4.  11/29: Hemodynamically stable, labs with further decrease of hemoglobin to 8.9, improving leukocytosis, anemia panel with anemia of chronic disease with iron deficiency. Renal function started improving.  Patient with excessive sedation after getting pain medications during day, will avoid sedating medications. Continuing IV fluid for another day. Pending insurance authorization for rehab.  11/30: Patient was very lethargic and somnolent when seen today.  Apparently she has not eaten anything since yesterday and hardly opening eyes.  Stat ABG with no hypercarbia, mild hypoxia.  CT head and chest x-ray without any acute abnormality.  CBG 195.  CBC and BMP seems stable with improvement in renal function and hemoglobin. Later patient becomes little more responsive and able to participate with PT and OT.  12/1: Patient more alert and tolerating diet well.  She was able to work with PT although feeling very weak and having pain with ambulation.  No nausea or vomiting.  Recheck procalcitonin yesterday to rule out any bacterial infection and it was negative. Patient to avoid opioids especially with the muscle relaxant as most likely the cause of her hypersomnolent and lethargy for more than 24 hours. She was giving tramadol instead of Oxy for pain.  Should try her Tylenol first followed by tramadol if needed.  Patient otherwise medically stable and is being discharged to rehab for further management.  She will continue on current medications and need to have a close follow-up with her providers for further recommendations.  Assessment and Plan: * Displaced intertrochanteric fracture of left femur, initial encounter for closed fracture (HCC) Secondary to mechanical fall, s/p ORIF on 09/06/2022 by orthopedic surgery. PT and OT are recommending SNF.  Patient with increased somnolence and lethargy after getting oxycodone and muscle relaxant yesterday.  All workup which include basic labs, chest  x-ray, ABG and CT head was without any acute abnormality. -Continue with pain management and supportive care. -Try avoiding narcotics.   Acute postoperative anemia due to expected blood loss Hemoglobin decreased to 10.7>>8.9 >>9.3 from 14.3 on admission. Likely secondary to blood loss during surgery. -Monitor hemoglobin -Anemia panel with anemia of chronic disease and some iron deficiency. -Start her on iron supplement  History of recurrent UTI (urinary tract infection) Pt has h/o recurrent uti.  No urinary symptoms.  Recently completed the treatment Urinalysis with only trace leukocytes.      Chronic kidney disease, stage 3b (HCC) Little worsening of creatinine after the surgery, at 1.88.  Baseline appears to be at 1.2-1.4. -Giving some gentle IV fluid. -Monitor renal function -Avoid nephrotoxins   CHB (complete heart block) (HCC) S/p pacemaker placement. Cardiology clearance was obtained before surgery.  Chronic systolic CHF (congestive heart failure) (HCC) Echocardiogram with EF of 45 to 50% earlier this year. Appears well compensated. -Continue to monitor  Dementia without behavioral disturbance (HCC) Cont aricept / namenda and effexor   Hypothyroidism -Continue home Synthroid  Essential hypertension Metoprolol 25 mg daily to be resumed .   Electrolyte abnormality Replace and follow levels.         Pain control - [employer] Controlled Substance Reporting System database was reviewed. and patient was instructed, not to drive, operate heavy machinery, perform activities at heights, swimming or participation in water activities or provide baby-sitting services while on Pain, Sleep and Anxiety Medications; until their outpatient Physician has advised to do so again. Also recommended to not to take more than prescribed Pain, Sleep and Anxiety Medications.  Consultants: Orthopedic surgery Procedures performed: ORIF Disposition: Skilled nursing facility Diet  recommendation:  Discharge Diet Orders (From admission, onward)     Start     Ordered   09/10/22 0000  Diet - low sodium heart healthy        09/10/22 1116           Cardiac diet DISCHARGE MEDICATION: Allergies as of 09/10/2022       Reactions   Alendronate Sodium Other (See Comments)   Bupropion Other (See Comments)   Tremors    Rofecoxib Diarrhea   Alendronate Rash        Medication List     STOP taking these medications    albuterol 108 (90 Base) MCG/ACT inhaler Commonly known as: VENTOLIN HFA   ciprofloxacin 250 MG tablet Commonly known as: Cipro   Vitamin D (Ergocalciferol) 1.25 MG (50000 UNIT) Caps capsule Commonly known as: DRISDOL       TAKE these medications    acetaminophen 325 MG tablet Commonly known as: TYLENOL Take 2 tablets (650 mg total) by mouth every 6 (six) hours as needed for mild pain, moderate pain or fever.   bisacodyl 10 MG suppository Commonly known as: DULCOLAX Place 1 suppository (10 mg total) rectally daily as needed for moderate constipation.   CALCIUM-CARB 600 + D PO Take 1 tablet by mouth daily.   chlorhexidine 4 % external liquid Commonly known as: Hibiclens Apply topically daily as needed.   cyanocobalamin 1000 MCG/ML injection Commonly known as: VITAMIN B12 Inject 1,000 mcg into the muscle every 30 (thirty) days.   docusate sodium 100 MG capsule Commonly known as: COLACE Take 1 capsule (100 mg total) by mouth 2 (two) times daily.   donepezil 10  MG tablet Commonly known as: ARICEPT Take 10 mg by mouth at bedtime.   enoxaparin 30 MG/0.3ML injection Commonly known as: LOVENOX Inject 0.3 mLs (30 mg total) into the skin daily for 14 days. Start taking on: September 11, 2022   estradiol 0.1 MG/GM vaginal cream Commonly known as: ESTRACE Estrogen Cream Instruction Discard applicator Apply pea sized amount to tip of finger to urethra before bed. Wash hands well after application. Use Monday, Wednesday and Friday    Fe Fum-Vit C-Vit B12-FA Caps capsule Commonly known as: TRIGELS-F FORTE Take 1 capsule by mouth 2 (two) times daily.   feeding supplement Liqd Take 237 mLs by mouth 3 (three) times daily between meals.   fluticasone 50 MCG/ACT nasal spray Commonly known as: FLONASE 2 sprays in both nostrils   folic acid 1 MG tablet Commonly known as: FOLVITE Take 1 mg by mouth daily.   levothyroxine 50 MCG tablet Commonly known as: SYNTHROID Take 50 mcg by mouth daily before breakfast.   memantine 10 MG tablet Commonly known as: NAMENDA Take 10 mg by mouth 2 (two) times daily.   methocarbamol 500 MG tablet Commonly known as: ROBAXIN Take 1 tablet (500 mg total) by mouth every 6 (six) hours as needed for muscle spasms.   metoprolol succinate 25 MG 24 hr tablet Commonly known as: TOPROL-XL Take 25 mg by mouth daily.   multivitamin with minerals Tabs tablet Take 1 tablet by mouth daily.   nystatin powder Commonly known as: MYCOSTATIN/NYSTOP Twice a day under skin folds that a red   polyethylene glycol powder 17 GM/SCOOP powder Commonly known as: GLYCOLAX/MIRALAX Take 17 g by mouth daily.   potassium chloride 10 MEQ tablet Commonly known as: KLOR-CON Take 10 mEq by mouth once a week. Every Monday   traMADol 50 MG tablet Commonly known as: ULTRAM Take 1 tablet (50 mg total) by mouth every 6 (six) hours as needed for moderate pain.   trimethoprim 100 MG tablet Commonly known as: TRIMPEX Take 1 tablet (100 mg total) by mouth daily.   Trospium Chloride 60 MG Cp24 Take 1 capsule (60 mg total) by mouth at bedtime.   venlafaxine XR 75 MG 24 hr capsule Commonly known as: EFFEXOR-XR Take 75 mg by mouth daily with breakfast.               Discharge Care Instructions  (From admission, onward)           Start     Ordered   09/10/22 0000  Leave dressing on - Keep it clean, dry, and intact until clinic visit        09/10/22 1116            Contact information for  follow-up providers     Dedra Skeens, PA-C. Schedule an appointment as soon as possible for a visit in 2 week(s).   Specialty: Orthopedic Surgery Why: For staple removal and x-rays of the left hip Contact information: [address] [place] [address] [phone]         Debbra Riding H[place], [place]. Schedule an appointment as soon as possible for a visit in 1 week(s).   Specialty: Family Medicine Contact information: [address] [place] [address] [phone]              Contact information for after-discharge care     Destination     [facility] Preferred SNF .  Service: Skilled Nursing Contact information: [address] [place] [address] [phone]                    Discharge Exam: Ceasar Mons Weights   09/05/22 1628 09/06/22 1410  Weight: 77.1 kg 77.1 kg   General.  Frail elderly lady, in no acute distress. Pulmonary.  Lungs clear bilaterally, normal respiratory effort. CV.  Regular rate and rhythm, no JVD, rub or murmur. Abdomen.  Soft, nontender, nondistended, BS positive. CNS.  Alert and oriented .  No focal neurologic deficit. Extremities.  No edema, no cyanosis, pulses intact and symmetrical. Psychiatry.  Appears to have some cognitive impairment  Condition at discharge: stable  The results of significant diagnostics from this hospitalization (including imaging, microbiology, ancillary and laboratory) are listed below for reference.   Imaging Studies: CT HEAD WO CONTRAST ( )  Result Date: 09/09/2022 CLINICAL DATA:  Mental status change. EXAM: CT HEAD WITHOUT CONTRAST TECHNIQUE: Contiguous axial images were obtained from the base of the skull through the vertex without intravenous contrast. RADIATION DOSE REDUCTION: This exam was performed according to the departmental dose-optimization program which  includes automated exposure control, adjustment of the mA and/or kV according to patient size and/or use of iterative reconstruction technique. COMPARISON:  CT head 09/05/2022, 08/15/2021 FINDINGS: Brain: Moderate ventricular enlargement, stable from prior studies and most likely due to generalized atrophy which is present. There is marked atrophy in the cerebellum bilaterally. Patchy periventricular deep white matter hypodensity is unchanged from prior studies Negative for acute infarct, hemorrhage, mass Vascular: Negative for hyperdense vessel Skull: Negative Sinuses/Orbits: Paranasal sinuses clear. Bilateral cataract extraction Other: None IMPRESSION: 1. No acute abnormality and no change from prior studies. 2. Atrophy and chronic microvascular ischemic change in the white matter. Electronically Signed   By: Marlan Palau M.D.   On: 09/09/2022 11:50   DG Chest Port 1 View  Result Date: 09/09/2022 CLINICAL DATA:  88 year old female with metabolic encephalopathy, shortness of breath. EXAM: PORTABLE CHEST 1 VIEW COMPARISON:  Portable chest 09/05/2022 and earlier. FINDINGS: Portable AP semi upright view at 1023 hours. Stable left subclavian approach pacer leads. No generator device. Larger lung volumes. Stable cardiac size and mediastinal contours. Regressed pulmonary interstitial opacity and/or vascularity. Allowing for portable technique the lungs are clear. No pneumothorax or pleural effusion identified. Negative visible bowel gas. Stable visualized osseous structures. IMPRESSION: No acute cardiopulmonary abnormality. Electronically Signed   By: Odessa Fleming M.D.   On: 09/09/2022 10:41   DG HIP UNILAT WITH PELVIS 2-3 VIEWS LEFT  Result Date: 09/06/2022 CLINICAL DATA:  IM nail fixation of LEFT hip fracture EXAM: DG HIP (WITH OR WITHOUT PELVIS) 2-3V LEFT COMPARISON:  09/05/2022 FINDINGS: Intramedullary nail fixation of intertrochanteric femur fracture. 7 intraoperative views provided. Last image demonstrates  short intramedullary femoral nail with distal locking screw in the proximal diaphysis. Two cannulated screws span the femoral neck. IMPRESSION: IM  fixation of intertrochanteric fracture Electronically Signed   By: Genevive Bi M.D.   On: 09/06/2022 16:02   DG C-Arm 1-60 Min-No Report  Result Date: 09/06/2022 Fluoroscopy was utilized by the requesting physician.  No radiographic interpretation.   CT Hip Left Wo Contrast  Result Date: 09/05/2022 CLINICAL DATA:  Hip fracture preop planning EXAM: CT OF THE LEFT HIP WITHOUT CONTRAST TECHNIQUE: Multidetector CT imaging of the left hip was performed according to the standard protocol. Multiplanar CT image reconstructions were also generated. RADIATION DOSE REDUCTION: This exam was performed according to the departmental  dose-optimization program which includes automated exposure control, adjustment of the mA and/or kV according to patient size and/or use of iterative reconstruction technique. COMPARISON:  Radiographs earlier today FINDINGS: Bones/Joint/Cartilage Acute comminuted mildly displaced left femur intertrochanteric fracture. There is approximately 1.5 cm of shortening with the femoral shaft displaced superiorly and laterally. The femoral head remains located in the acetabulum. The lesser trochanter is displaced medially. Remote posttraumatic change to the left inferior pubic ramus. Ligaments Suboptimally assessed by CT. Muscles and Tendons Mild edema within the gluteus minimus and medius. Soft tissues Colonic diverticulosis without diverticulitis. IMPRESSION: Intertrochanteric fracture of the left femur as described. Electronically Signed   By: Minerva Fester M.D.   On: 09/05/2022 18:43   CT Head Wo Contrast  Result Date: 09/05/2022 CLINICAL DATA:  88 year old female with head and neck injury from fall. History of normal pressure hydrocephalus. EXAM: CT HEAD WITHOUT CONTRAST CT CERVICAL SPINE WITHOUT CONTRAST TECHNIQUE: Multidetector CT  imaging of the head and cervical spine was performed following the standard protocol without intravenous contrast. Multiplanar CT image reconstructions of the cervical spine were also generated. RADIATION DOSE REDUCTION: This exam was performed according to the departmental dose-optimization program which includes automated exposure control, adjustment of the mA and/or kV according to patient size and/or use of iterative reconstruction technique. COMPARISON:  07/07/2022 and prior CTs FINDINGS: CT HEAD FINDINGS Brain: There is no evidence of acute infarct, hemorrhage, extra-axial collection, midline shift or mass lesion/mass effect. Moderate ventriculomegaly, periventricular hypodensities and atrophy are unchanged. Vascular: Carotid atherosclerotic calcifications are noted. Skull: Normal. Negative for fracture or focal lesion. Sinuses/Orbits: No acute finding. Other: Mild LEFT forehead soft tissue swelling noted. CT CERVICAL SPINE FINDINGS Alignment: Reversal of the normal cervical lordosis again noted without evidence of subluxation. Skull base and vertebrae: No acute fracture. No primary bone lesion or focal pathologic process. Soft tissues and spinal canal: No prevertebral fluid or swelling. No visible canal hematoma. Disc levels: Mild to moderate degenerative disc disease/spondylosis from C3-C7 again noted contributing to central spinal and bony foraminal narrowing at multiple levels. Upper chest: No acute abnormality Other: None IMPRESSION: 1. No evidence of acute intracranial abnormality. Unchanged moderate ventriculomegaly, periventricular hypodensities and atrophy. 2. Mild LEFT forehead soft tissue swelling without fracture. 3. No evidence of acute injury to the cervical spine. Degenerative changes as described. Electronically Signed   By: Harmon Pier M.D.   On: 09/05/2022 18:41   CT Cervical Spine Wo Contrast  Result Date: 09/05/2022 CLINICAL DATA:  88 year old female with head and neck injury from  fall. History of normal pressure hydrocephalus. EXAM: CT HEAD WITHOUT CONTRAST CT CERVICAL SPINE WITHOUT CONTRAST TECHNIQUE: Multidetector CT imaging of the head and cervical spine was performed following the standard protocol without intravenous contrast. Multiplanar CT image reconstructions of the cervical spine were also generated. RADIATION DOSE REDUCTION: This exam was performed according to the departmental dose-optimization program which includes automated exposure control, adjustment of the mA and/or kV according to patient size and/or use of iterative reconstruction technique. COMPARISON:  07/07/2022 and prior CTs FINDINGS: CT HEAD FINDINGS Brain: There is no evidence of acute infarct, hemorrhage, extra-axial collection, midline shift or mass lesion/mass effect. Moderate ventriculomegaly, periventricular hypodensities and atrophy are unchanged. Vascular: Carotid atherosclerotic calcifications are noted. Skull: Normal. Negative for fracture or focal lesion. Sinuses/Orbits: No acute finding. Other: Mild LEFT forehead soft tissue swelling noted. CT CERVICAL SPINE FINDINGS Alignment: Reversal of the normal cervical lordosis again noted without evidence of subluxation. Skull base and vertebrae: No acute fracture.  No primary bone lesion or focal pathologic process. Soft tissues and spinal canal: No prevertebral fluid or swelling. No visible canal hematoma. Disc levels: Mild to moderate degenerative disc disease/spondylosis from C3-C7 again noted contributing to central spinal and bony foraminal narrowing at multiple levels. Upper chest: No acute abnormality Other: None IMPRESSION: 1. No evidence of acute intracranial abnormality. Unchanged moderate ventriculomegaly, periventricular hypodensities and atrophy. 2. Mild LEFT forehead soft tissue swelling without fracture. 3. No evidence of acute injury to the cervical spine. Degenerative changes as described. Electronically Signed   By: Harmon Pier M.D.   On:  09/05/2022 18:41   DG Chest 1 View  Result Date: 09/05/2022 CLINICAL DATA:  Hip fracture requiring operative repair; chest pain EXAM: CHEST  1 VIEW COMPARISON:  Radiographs 07/07/2022 FINDINGS: Abandoned pacer leads are unchanged. Leadless pacer. Increased size of the cardiomediastinal silhouette likely due to differences in technique. Chronic bronchitic changes and interstitial thickening. Left lower lung infiltrates or atelectasis. Question minimally displaced right lateral third rib fracture versus projection artifact. IMPRESSION: Left lower lung atelectasis or infiltrates. Question minimally displaced right lateral third rib fracture versus projection artifact. Recommend correlation with site of pain and consider CT for further evaluation. Electronically Signed   By: Minerva Fester M.D.   On: 09/05/2022 17:39   DG Hip Unilat W or Wo Pelvis 2-3 Views Left  Result Date: 09/05/2022 CLINICAL DATA:  Fall with hip fracture requiring repair EXAM: DG HIP (WITH OR WITHOUT PELVIS) 2-3V LEFT COMPARISON:  Radiographs 01/30/2012 FINDINGS: Acute intertrochanteric fracture of the proximal left femur with superior and lateral displacement of the distal femoral shaft. The lesser trochanter is displaced medially. The left femoral head remains located in the acetabulum. Question additional nondisplaced fracture of the medial wall of the left acetabulum. Remote posttraumatic change about the right superior and inferior pubic rami. Degenerative changes pubic symphysis, both hips, SI joints and lower lumbar spine. Demineralization. IMPRESSION: Acute intertrochanteric fracture of the left femur. Question additional nondisplaced fracture of the medial wall of the left acetabulum. Consider CT for further evaluation. Electronically Signed   By: Minerva Fester M.D.   On: 09/05/2022 17:34    Microbiology: Results for orders placed or performed in visit on 08/18/22  CULTURE, URINE COMPREHENSIVE     Status: Abnormal    Collection Time: 08/18/22  2:29 PM   Specimen: Urine   UR  Result Value Ref Range Status   Urine Culture, Comprehensive Final report (A)  Final   Organism ID, Bacteria Klebsiella pneumoniae (A)  Final    Comment: Multi-Drug Resistant Organism Greater than 100,000 colony forming units per mL    ANTIMICROBIAL SUSCEPTIBILITY Comment  Final    Comment:       ** S = Susceptible; I = Intermediate; R = Resistant **                    P = Positive; N = Negative             MICS are expressed in micrograms per mL    Antibiotic                 RSLT#1    RSLT#2    RSLT#3    RSLT#4 Amoxicillin/Clavulanic Acid    S Ampicillin                     R Cefepime  S Ceftriaxone                    S Cefuroxime                     I Ciprofloxacin                  S Ertapenem                      S Gentamicin                     S Imipenem                       S Levofloxacin                   S Meropenem                      S Nitrofurantoin                 R Piperacillin/Tazobactam        S Tetracycline                   I Tobramycin                     S Trimethoprim/Sulfa             S   Microscopic Examination     Status: Abnormal   Collection Time: 08/18/22  2:29 PM   Urine  Result Value Ref Range Status   WBC, UA >30 (A) 0 - 5 /hpf Final   RBC, Urine 0-2 0 - 2 /hpf Final   Epithelial Cells (non renal) 0-10 0 - 10 /hpf Final   Bacteria, UA Many (A) None seen/Few Final    Labs: CBC: Recent Labs  Lab 09/05/22 1639 09/07/22 0625 09/08/22 0336 09/09/22 0638  WBC 8.2 15.5* 12.5* 14.8*  NEUTROABS 4.5  --   --   --   HGB 14.3 10.7* 8.9* 9.3*  HCT 44.1 32.5* 27.4* 29.1*  MCV 86.3 87.1 86.2 87.1  PLT 196 167 164 214   Basic Metabolic Panel: Recent Labs  Lab 09/05/22 1639 09/07/22 0625 09/08/22 0336 09/09/22 0638  NA 145 140 140 141  K 3.9 4.0 4.3 4.7  CL 110 109 114* 112*  CO2 28 23 20* 23  GLUCOSE 102* 155* 125* 134*  BUN 16 27* 35* 27*  CREATININE  1.40* 1.88* 1.58* 1.26*  CALCIUM 9.0 7.9* 8.4* 8.3*   Liver Function Tests: No results for input(s): "AST", "ALT", "ALKPHOS", "BILITOT", "PROT", "ALBUMIN" in the last 168 hours. CBG: Recent Labs  Lab 09/09/22 1024  GLUCAP 195*    Discharge time spent: greater than 30 minutes.  This record has been created using Conservation officer, historic buildings. Errors have been sought and corrected,but may not always be located. Such creation errors do not reflect on the standard of care.   Signed: Arnetha Courser, MD [facility] 09/10/2022

## 2022-09-10 NOTE — Progress Notes (Signed)
  Subjective: 4 Days Post-Op Procedure(s) (LRB): INTRAMEDULLARY (IM) NAIL INTERTROCHANTERIC (Left) Patient reports pain as mild to moderate.  Family in the room.  Patient improved from yesterday with increased dementia.  More alert.  Eating better.  No significant increased discomfort. Patient is well, and has had no acute complaints or problems Plan is to go Rehab after hospital stay. Negative for chest pain and shortness of breath Fever: no Gastrointestinal: Negative for nausea and vomiting  Objective: Vital signs in last 24 hours: Temp:  [98.7 F (37.1 C)-100 F (37.8 C)] 98.7 F (37.1 C) (11/30 2300) Pulse Rate:  [58-71] 71 (11/30 2000) Resp:  [16] 16 (11/30 2000) BP: (96-114)/(54-72) 96/72 (11/30 2000) SpO2:  [96 %-98 %] 98 % (11/30 2000)  Intake/Output from previous day:  Intake/Output Summary (Last 24 hours) at 09/10/2022 0705 Last data filed at 09/10/2022 0643 Gross per 24 hour  Intake 1467.8 ml  Output 1350 ml  Net 117.8 ml    Intake/Output this shift: No intake/output data recorded.  Labs: Recent Labs    09/08/22 0336 09/09/22 0638  HGB 8.9* 9.3*   Recent Labs    09/08/22 0336 09/09/22 0638  WBC 12.5* 14.8*  RBC 3.18*  3.20* 3.34*  HCT 27.4* 29.1*  PLT 164 214   Recent Labs    09/08/22 0336 09/09/22 0638  NA 140 141  K 4.3 4.7  CL 114* 112*  CO2 20* 23  BUN 35* 27*  CREATININE 1.58* 1.26*  GLUCOSE 125* 134*  CALCIUM 8.4* 8.3*   No results for input(s): "LABPT", "INR" in the last 72 hours.    EXAM General - Patient is Alert and Oriented Extremity - Neurovascular intact Sensation intact distally Dorsiflexion/Plantar flexion intact Compartment soft Dressing/Incision - clean, dry, no drainage Motor Function - difficulty moving foot and toes well on exam.  Pain with motion.  Bed to chair  Past Medical History:  Diagnosis Date   Arthritis     Assessment/Plan: 4 Days Post-Op Procedure(s) (LRB): INTRAMEDULLARY (IM) NAIL  INTERTROCHANTERIC (Left) Principal Problem:   Displaced intertrochanteric fracture of left femur, initial encounter for closed fracture (HCC) Active Problems:   CHB (complete heart block) (HCC)   Chronic kidney disease, stage 3b (HCC)   Dementia without behavioral disturbance (HCC)   Hypothyroidism   Essential hypertension   History of recurrent UTI (urinary tract infection)   Chronic systolic CHF (congestive heart failure) (HCC)   Acute postoperative anemia due to expected blood loss  Estimated body mass index is 26.63 kg/m as calculated from the following:   Height as of this encounter: 5\' 7"  (1.702 m).   Weight as of this encounter: 77.1 kg. Advance diet Up with therapy  Discharge planning.  Plan to follow-up at [facility] clinic orthopedics in 2 weeks for staple removal and x-rays of the left hip.  Continue to limit narcotics to prevent increased dementia.  DVT Prophylaxis - Lovenox, Foot Pumps, and TED hose Weight-Bearing as tolerated to left leg  [facility], PA-C Orthopaedic Surgery 09/10/2022, 7:05 AM

## 2022-09-10 NOTE — TOC Progression Note (Signed)
Transition of Care [facility]) - Progression Note    Patient Details  Name: Kerri [name] MRN: [id number] Date of Birth: 15-Apr-1931  [facility]) CM/SW Contact  Marlowe Sax, RN Phone Number: 09/10/2022, 11:37 AM  Clinical Narrative:     Going to room 504 at [facility] called, caregiver in the room and is aware   Expected Discharge Plan: Skilled Nursing Facility Barriers to Discharge: SNF Pending bed offer, Insurance Authorization  Expected Discharge Plan and Services Expected Discharge Plan: Skilled Nursing Facility   Discharge Planning Services: CM Consult     Expected Discharge Date: 09/10/22                                     Social Determinants of Health (SDOH) Interventions Housing Interventions: Intervention Not Indicated  Readmission Risk Interventions    09/10/2022   10:03 AM  Readmission Risk Prevention Plan  Transportation Screening Complete  Medication Review (RN Care Manager) Complete  PCP or Specialist appointment within 3-5 days of discharge Complete  HRI or Home Care Consult Complete  SW Recovery Care/Counseling Consult Complete  Palliative Care Screening Not Applicable  Skilled Nursing Facility Complete

## 2022-09-26 ENCOUNTER — Other Ambulatory Visit: Payer: Self-pay | Admitting: Urology

## 2022-10-12 ENCOUNTER — Encounter: Payer: Self-pay | Admitting: Physician Assistant

## 2022-10-12 ENCOUNTER — Ambulatory Visit (INDEPENDENT_AMBULATORY_CARE_PROVIDER_SITE_OTHER): Payer: Medicare Other | Admitting: Physician Assistant

## 2022-10-12 VITALS — BP 130/70 | Ht 68.0 in | Wt 170.0 lb

## 2022-10-12 DIAGNOSIS — R3129 Other microscopic hematuria: Secondary | ICD-10-CM

## 2022-10-12 DIAGNOSIS — R8271 Bacteriuria: Secondary | ICD-10-CM

## 2022-10-12 DIAGNOSIS — N390 Urinary tract infection, site not specified: Secondary | ICD-10-CM

## 2022-10-12 DIAGNOSIS — Z87440 Personal history of urinary (tract) infections: Secondary | ICD-10-CM

## 2022-10-12 LAB — URINALYSIS, COMPLETE
Bilirubin, UA: NEGATIVE
Glucose, UA: NEGATIVE
Ketones, UA: NEGATIVE
Leukocytes,UA: NEGATIVE
Nitrite, UA: NEGATIVE
Protein,UA: NEGATIVE
Specific Gravity, UA: 1.02 (ref 1.005–1.030)
Urobilinogen, Ur: 0.2 mg/dL (ref 0.2–1.0)
pH, UA: 5.5 (ref 5.0–7.5)

## 2022-10-12 LAB — MICROSCOPIC EXAMINATION

## 2022-10-12 MED ORDER — AMOXICILLIN 500 MG PO CAPS: 500.0000 mg | ORAL_CAPSULE | Freq: Three times a day (TID) | ORAL | 0 refills | Status: DC

## 2022-10-12 NOTE — Progress Notes (Unsigned)
Patient ID: Kerri [name], female   DOB: [birth year], 89 y.o.   MRN: [id number] In and Out Catheterization  Patient is present today for a I & O catheterization due to possible UTI. Patient was cleaned and prepped in a sterile fashion with betadine . A 14FR cath was inserted no complications were noted , 41ml of urine return was noted, urine was yellow in color. A clean urine sample was collected for UA. Bladder was drained  And catheter was removed with out difficulty.    Performed by: Edwin Dada, CMA  Follow up/ Additional notes:

## 2022-10-13 IMAGING — CR DG CHEST 2V
1 series · 3 of 3 positions shown · non-contrast
Comparison: October 15, 2021

CLINICAL DATA: Suspected sepsis.

EXAM:
CHEST - 2 VIEW

[Series 1: dg chest 2 view · 0.14mm/px · 3 of 3 slices shown]
[im 1/3]
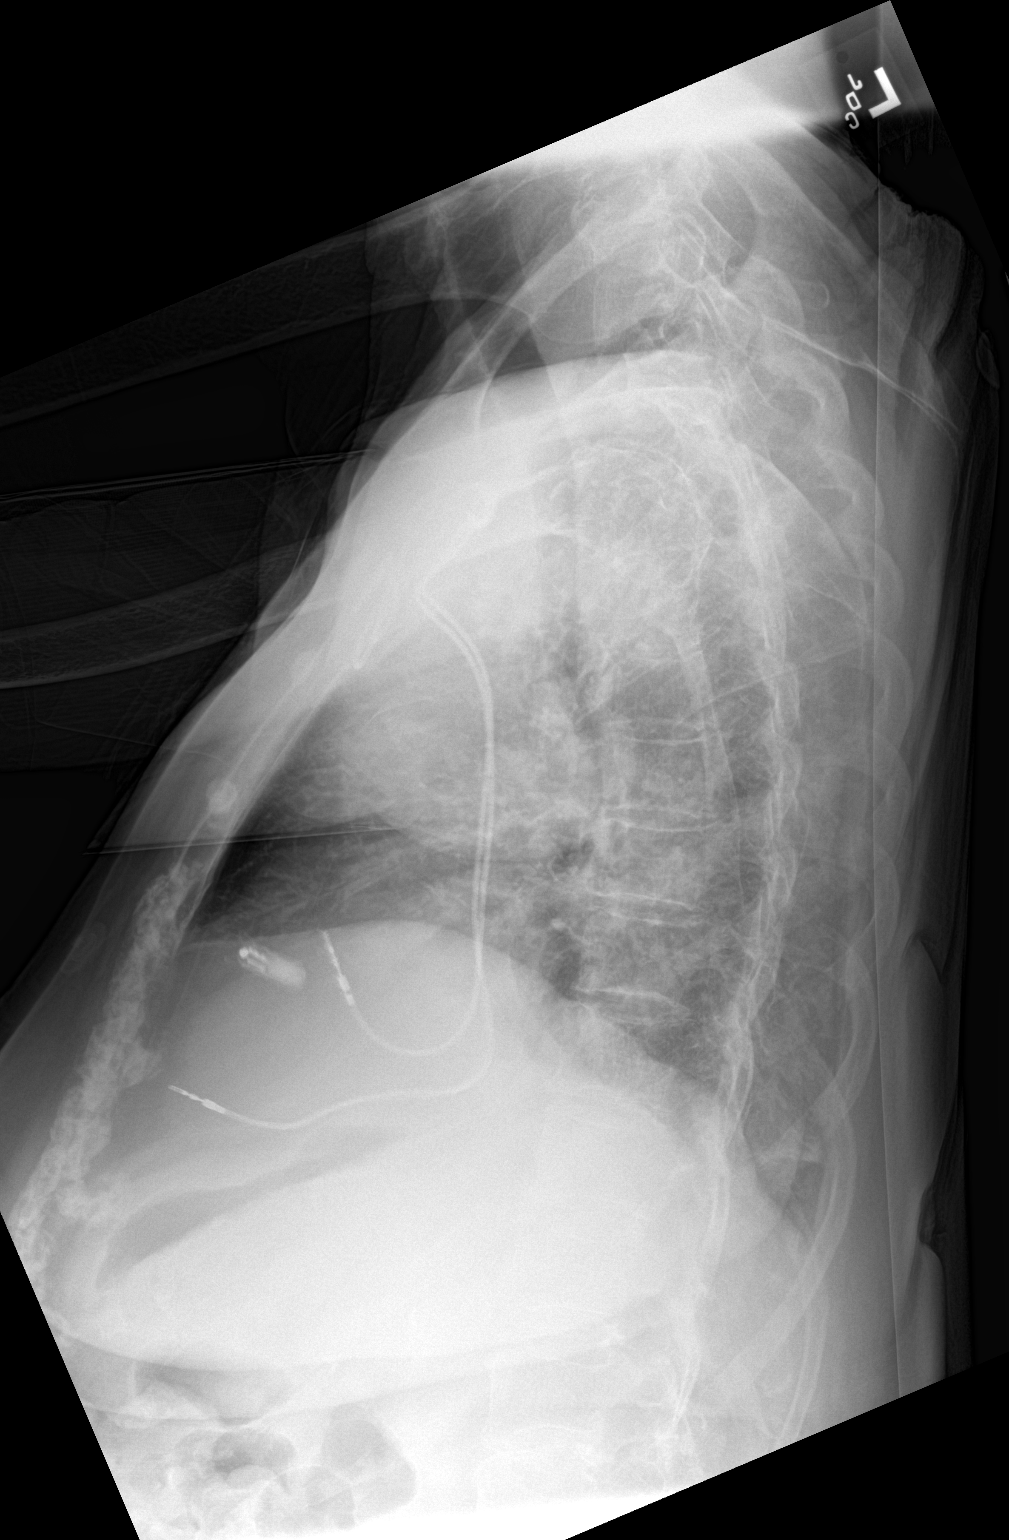
[im 2/3]
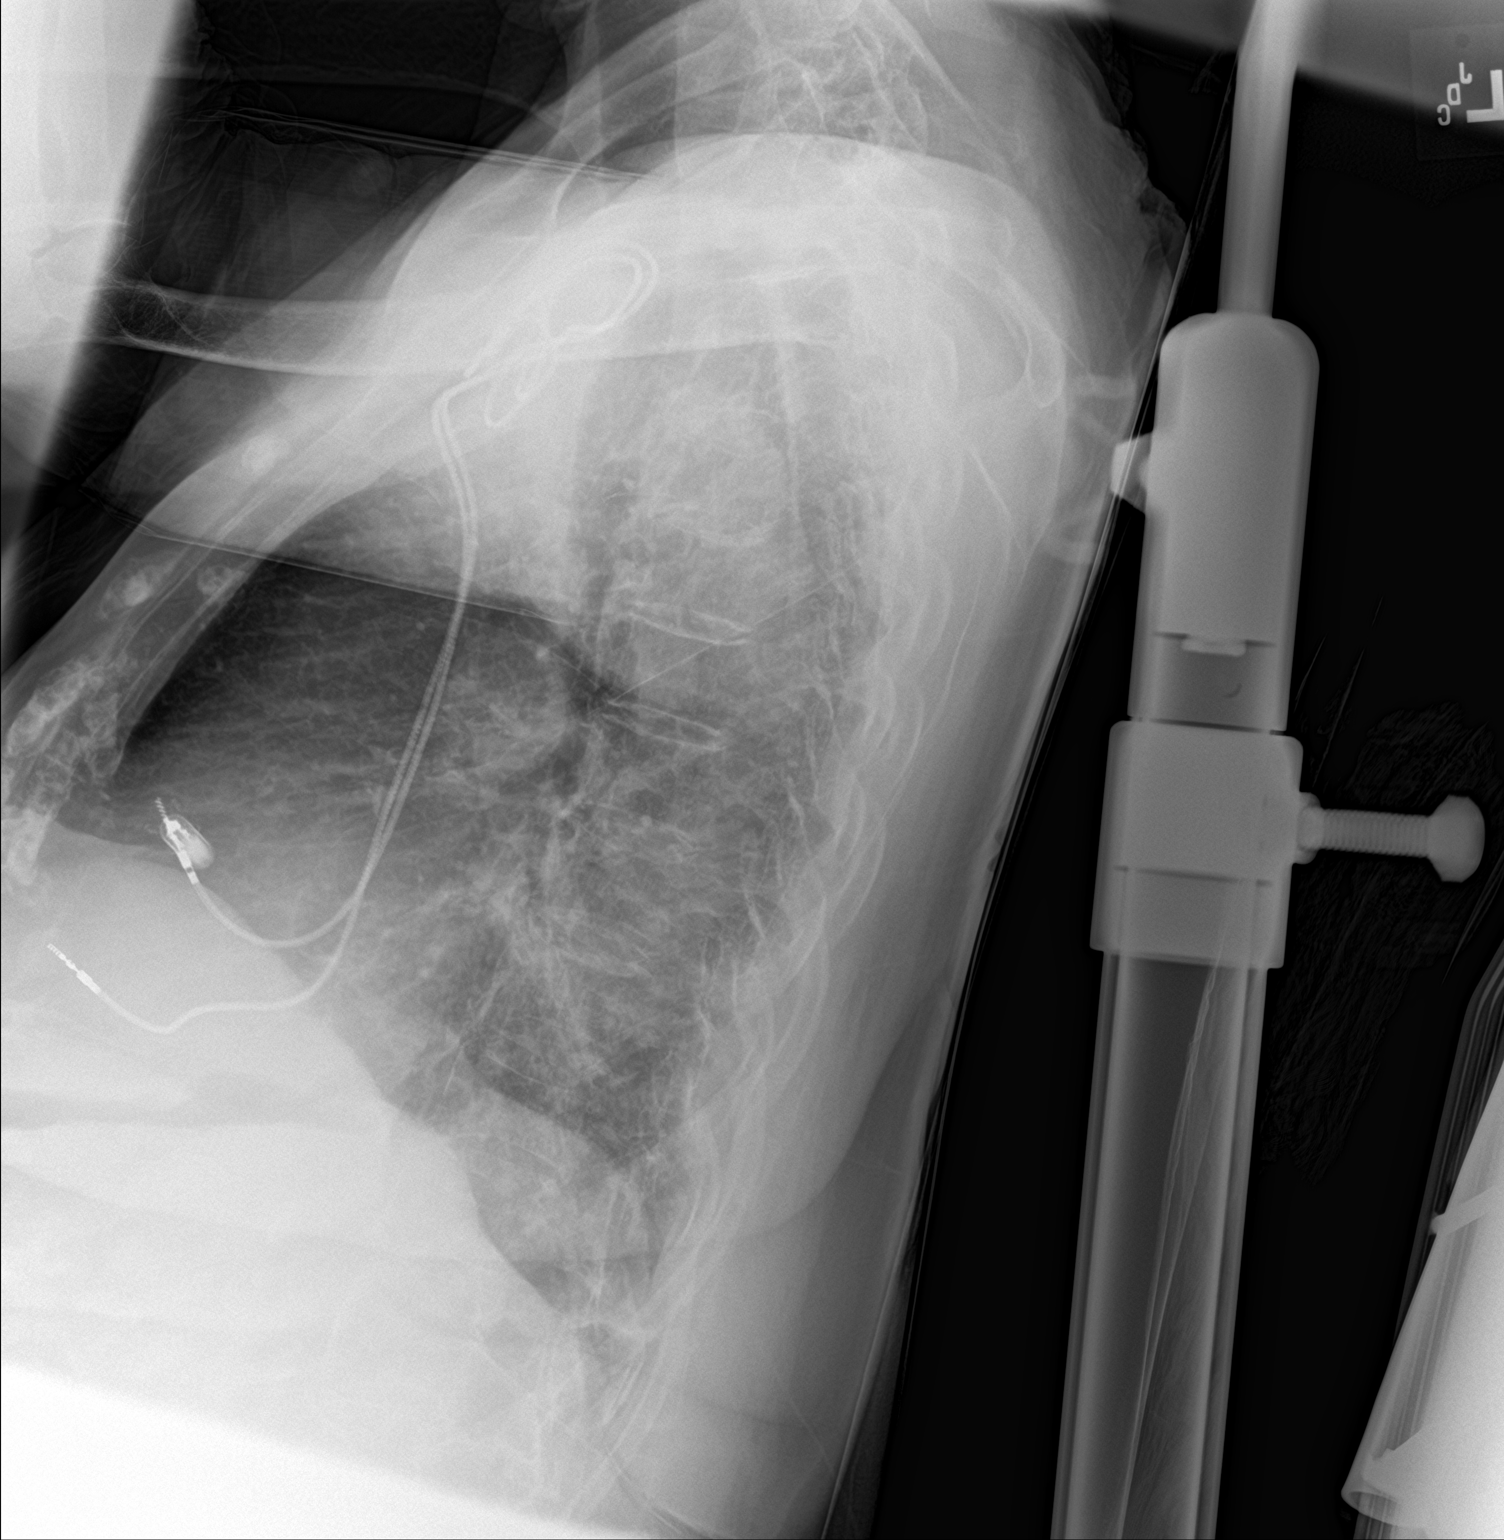
[im 3/3]
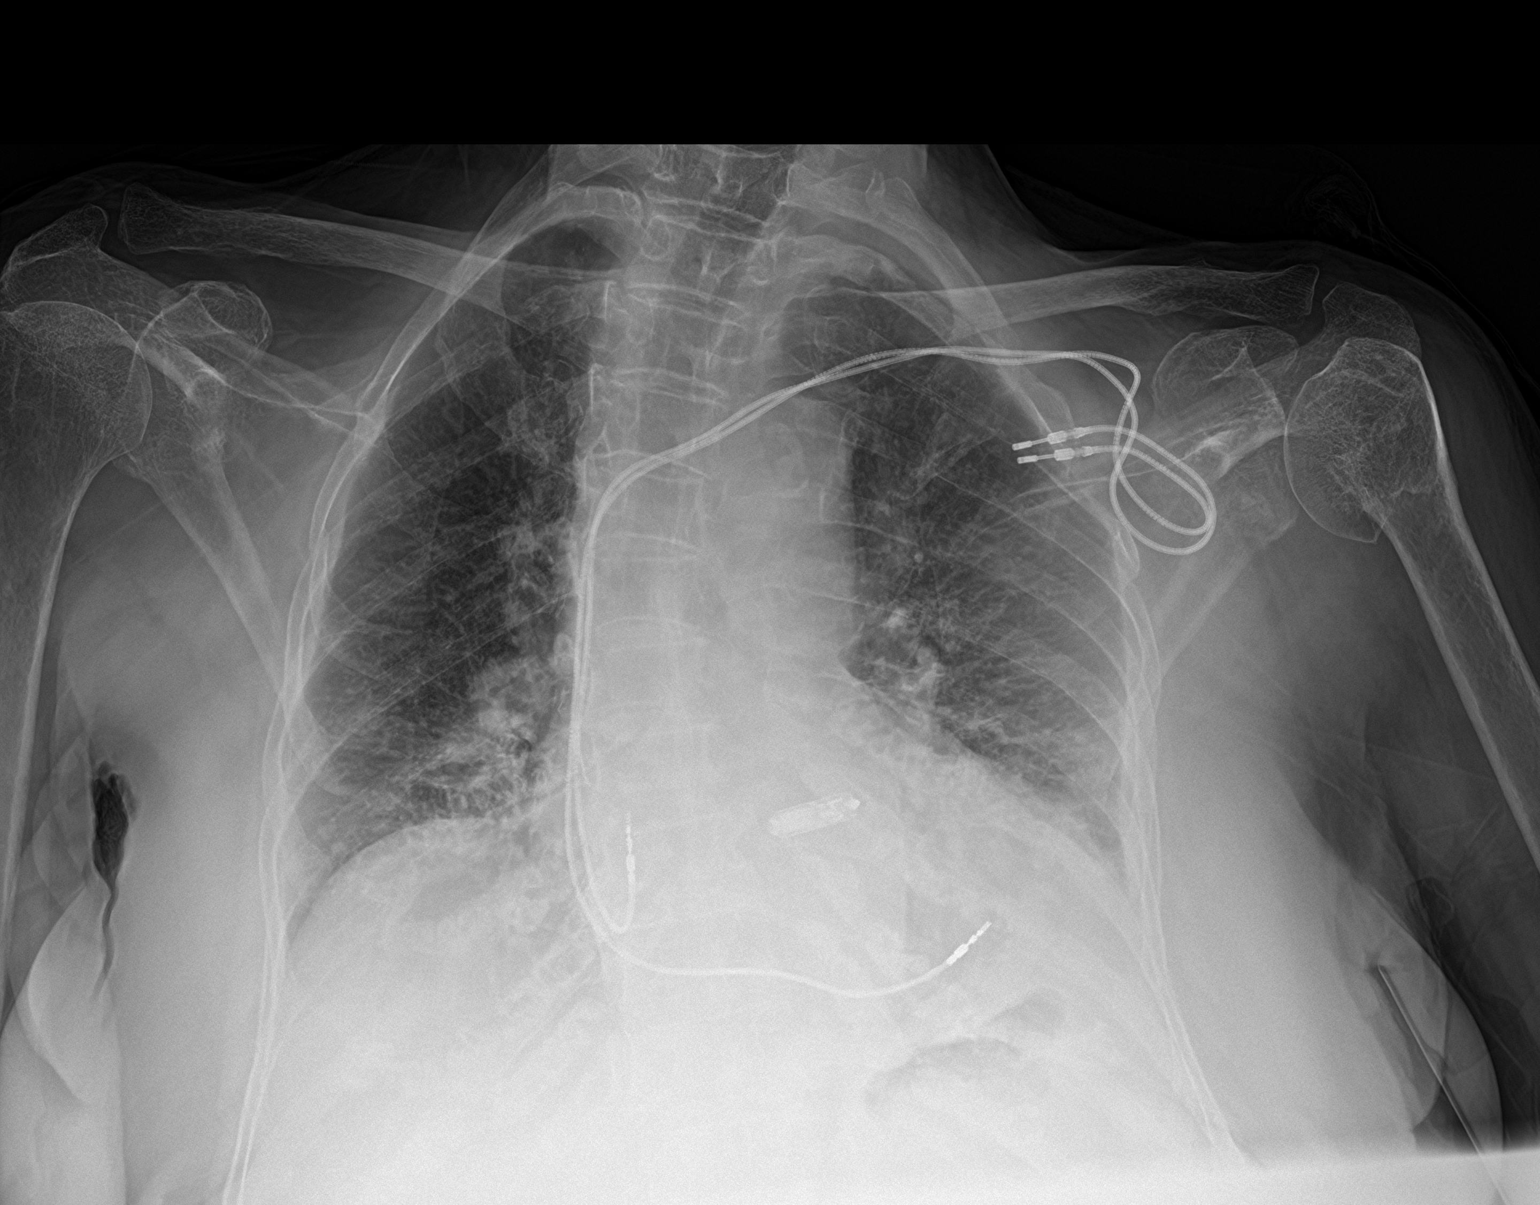

[3 of 3 positions shown; findings below may reference images not displayed]

FINDINGS: AICD wires are again seen and unchanged in position. The heart size
and mediastinal contours are within normal limits. A radiopaque loop
recorder device is noted. Low lung volumes are seen with mild,
chronic appearing increased lung markings. Mild atelectasis is
present within the bilateral lung bases. There is no evidence of a
pleural effusion or pneumothorax. There is mild dextroscoliosis of
the thoracic spine with multilevel degenerative changes.
IMPRESSION: Low lung volumes with mild bibasilar atelectasis.

## 2022-10-14 NOTE — Progress Notes (Signed)
10/12/2022 9:18 AM   Kerri [name] 21-Mar-1931 [id number]  CC: Chief Complaint  Patient presents with   Acute Visit    UTI    HPI: Kerri [name] is a 89 y.o. female with PMH dementia, OAB, and recurrent UTIs who presents today for evaluation of possible UTI.  He is accompanied today by her caregiver and daughter, who contribute to HPI.  Today they report a 3-day history of worsening confusion, motor skills, and malodorous urine.  She is not currently ambulatory.  Her caregiver and daughter insist that these are her typical symptoms of UTI.  She denies fevers, flank pain, dysuria, nausea, and vomiting.  No known stone history.  She is on daily suppressive trimethoprim and Myrbetriq.  Ultimately, her daughter and caregiver report that they started her on amoxicillin 500 mg 3 times daily 2 days ago.  She has completed 6 doses so far.  In-office UA today positive for trace lysed blood; urine microscopy with 3-10 RBCs/HPF, granular casts, and moderate bacteria.  PVR 65 mL.  PMH: Past Medical History:  Diagnosis Date   Arthritis     Surgical History: Past Surgical History:  Procedure Laterality Date   INTRAMEDULLARY (IM) NAIL INTERTROCHANTERIC Left 09/06/2022   Procedure: INTRAMEDULLARY (IM) NAIL INTERTROCHANTERIC;  Surgeon: Signa Kell, MD;  Location: [facility] ORS;  Service: Orthopedics;  Laterality: Left;   PACEMAKER LEADLESS INSERTION N/A 01/07/2021   Procedure: PACEMAKER LEADLESS INSERTION;  Surgeon: Marcina Millard, MD;  Location: [facility] INVASIVE CV LAB;  Service: Cardiovascular;  Laterality: N/A;   PPM GENERATOR REMOVAL N/A 01/07/2021   Procedure: PPM GENERATOR REMOVAL;  Surgeon: Marcina Millard, MD;  Location: [facility] INVASIVE CV LAB;  Service: Cardiovascular;  Laterality: N/A;    Home Medications:  Allergies as of 10/12/2022       Reactions   Alendronate Sodium Other (See Comments)   Bupropion Other (See Comments)   Tremors    Rofecoxib Diarrhea   Alendronate  Rash        Medication List        Accurate as of October 12, 2022 11:59 PM. If you have any questions, ask your nurse or doctor.          acetaminophen 325 MG tablet Commonly known as: TYLENOL Take 2 tablets (650 mg total) by mouth every 6 (six) hours as needed for mild pain, moderate pain or fever.   amoxicillin 500 MG capsule Commonly known as: AMOXIL Take 1 capsule (500 mg total) by mouth 3 (three) times daily for 3 days. Started by: [name], PA-C   bisacodyl 10 MG suppository Commonly known as: DULCOLAX Place 1 suppository (10 mg total) rectally daily as needed for moderate constipation.   CALCIUM-CARB 600 + D PO Take 1 tablet by mouth daily.   chlorhexidine 4 % external liquid Commonly known as: Hibiclens Apply topically daily as needed.   cyanocobalamin 1000 MCG/ML injection Commonly known as: VITAMIN B12 Inject 1,000 mcg into the muscle every 30 (thirty) days.   docusate sodium 100 MG capsule Commonly known as: COLACE Take 1 capsule (100 mg total) by mouth 2 (two) times daily.   donepezil 10 MG tablet Commonly known as: ARICEPT Take 10 mg by mouth at bedtime.   enoxaparin 30 MG/0.3ML injection Commonly known as: LOVENOX Inject 0.3 mLs (30 mg total) into the skin daily for 14 days.   estradiol 0.1 MG/GM vaginal cream Commonly known as: ESTRACE Estrogen Cream Instruction Discard applicator Apply pea sized amount to tip of finger to urethra  before bed. Wash hands well after application. Use Monday, Wednesday and Friday   Fe Fum-Vit C-Vit B12-FA Caps capsule Commonly known as: TRIGELS-F FORTE Take 1 capsule by mouth 2 (two) times daily.   feeding supplement Liqd Take 237 mLs by mouth 3 (three) times daily between meals.   fluticasone 50 MCG/ACT nasal spray Commonly known as: FLONASE 2 sprays in both nostrils   folic acid 1 MG tablet Commonly known as: FOLVITE Take 1 mg by mouth daily.   levothyroxine 50 MCG tablet Commonly known  as: SYNTHROID Take 50 mcg by mouth daily before breakfast.   memantine 10 MG tablet Commonly known as: NAMENDA Take 10 mg by mouth 2 (two) times daily.   methocarbamol 500 MG tablet Commonly known as: ROBAXIN Take 1 tablet (500 mg total) by mouth every 6 (six) hours as needed for muscle spasms.   metoprolol succinate 25 MG 24 hr tablet Commonly known as: TOPROL-XL Take 25 mg by mouth daily.   multivitamin with minerals Tabs tablet Take 1 tablet by mouth daily.   nystatin powder Commonly known as: MYCOSTATIN/NYSTOP Twice a day under skin folds that a red   polyethylene glycol powder 17 GM/SCOOP powder Commonly known as: GLYCOLAX/MIRALAX Take 17 g by mouth daily.   potassium chloride 10 MEQ tablet Commonly known as: KLOR-CON Take 10 mEq by mouth once a week. Every Monday   traMADol 50 MG tablet Commonly known as: ULTRAM Take 1 tablet (50 mg total) by mouth every 6 (six) hours as needed for moderate pain.   trimethoprim 100 MG tablet Commonly known as: TRIMPEX Take 1 tablet (100 mg total) by mouth daily.   Trospium Chloride 60 MG Cp24 Take 1 capsule (60 mg total) by mouth at bedtime.   venlafaxine XR 75 MG 24 hr capsule Commonly known as: EFFEXOR-XR Take 75 mg by mouth daily with breakfast.        Allergies:  Allergies  Allergen Reactions   Alendronate Sodium Other (See Comments)   Bupropion Other (See Comments)    Tremors      Rofecoxib Diarrhea   Alendronate Rash    Family History: No family history on file.  Social History:   reports that she has never smoked. She has never been exposed to tobacco smoke. She has never used smokeless tobacco. She reports that she does not drink alcohol and does not use drugs.  Physical Exam: BP 130/70   Ht 5\' 8"  (1.727 m)   Wt 170 lb (77.1 kg)   BMI 25.85 kg/m   Constitutional:  Alert, no acute distress, nontoxic appearing HEENT: [place], AT Cardiovascular: No clubbing, cyanosis, or edema Respiratory: Normal  respiratory effort, no increased work of breathing Skin: No rashes, bruises or suspicious lesions Neurologic: Grossly intact, no focal deficits, moving all 4 extremities Psychiatric: Normal mood and affect  Laboratory Data: Results for orders placed or performed in visit on 10/12/22  Microscopic Examination   Urine  Result Value Ref Range   WBC, UA 0-5 0 - 5 /hpf   RBC, Urine 3-10 (A) 0 - 2 /hpf   Epithelial Cells (non renal) 0-10 0 - 10 /hpf   Casts Present (A) None seen /lpf   Cast Type Granular casts (A) N/A   Mucus, UA Present (A) Not Estab.   Bacteria, UA Moderate (A) None seen/Few  Urinalysis, Complete  Result Value Ref Range   Specific Gravity, UA 1.020 1.005 - 1.030   pH, UA 5.5 5.0 - 7.5   Color, UA Yellow  Yellow   Appearance Ur Clear Clear   Leukocytes,UA Negative Negative   Protein,UA Negative Negative/Trace   Glucose, UA Negative Negative   Ketones, UA Negative Negative   RBC, UA Trace (A) Negative   Bilirubin, UA Negative Negative   Urobilinogen, Ur 0.2 0.2 - 1.0 mg/dL   Nitrite, UA Negative Negative   Microscopic Examination See below:    Assessment & Plan:   1. Recurrent UTI Today with microscopic hematuria and bacteriuria.  We discussed that since they already started her on antibiotics 2 days ago, it is impossible for me to make a diagnosis of UTI, especially since she denies irritative voiding symptoms and seems to only have had a decline in her cognitive and motor status.  Will prescribe an additional 3 days of amoxicillin to complete a course and send her urine for culture, though I suspect this will be negative.  If she does not improve, she needs to follow-up with her PCP for consideration of alternative sources. - Urinalysis, Complete - CULTURE, URINE COMPREHENSIVE - amoxicillin (AMOXIL) 500 MG capsule; Take 1 capsule (500 mg total) by mouth 3 (three) times daily for 3 days.  Dispense: 9 capsule; Refill: 0  Return if symptoms worsen or fail to improve,  for Will call with results.  Debroah Loop, PA-C  [facility] [address], [place], [place] [address] [phone]

## 2022-10-15 ENCOUNTER — Other Ambulatory Visit: Payer: Self-pay | Admitting: Physician Assistant

## 2022-10-15 DIAGNOSIS — N390 Urinary tract infection, site not specified: Secondary | ICD-10-CM

## 2022-10-15 LAB — CULTURE, URINE COMPREHENSIVE

## 2022-10-15 MED ORDER — AMOXICILLIN 500 MG PO CAPS: 500.0000 mg | ORAL_CAPSULE | Freq: Three times a day (TID) | ORAL | 0 refills | Status: AC

## 2022-10-19 ENCOUNTER — Other Ambulatory Visit: Payer: Self-pay | Admitting: Orthopedic Surgery

## 2022-10-19 DIAGNOSIS — M4807 Spinal stenosis, lumbosacral region: Secondary | ICD-10-CM

## 2022-10-27 ENCOUNTER — Telehealth: Payer: Self-pay | Admitting: Physician Assistant

## 2022-10-27 NOTE — Telephone Encounter (Signed)
Patient's daughter-in-law Kerri [name]) called with concerns of UTI. She stated that antibiotics were finished and patient is still having symptoms. She requested additional antibiotics due to her concern for patient becoming septic. [name] would like to speak with [name] regarding patient's treatment going forward. I relayed this to Kerri [name], and she stated that she was going to double up on her antibiotic until she hears back from our office.

## 2022-10-28 ENCOUNTER — Ambulatory Visit
Admission: RE | Admit: 2022-10-28 | Discharge: 2022-10-28 | Disposition: A | Discharge status: Home / Self Care | Payer: Medicare Other | Source: Ambulatory Visit | Attending: Orthopedic Surgery | Admitting: Orthopedic Surgery

## 2022-10-28 ENCOUNTER — Ambulatory Visit
Admission: RE | Admit: 2022-10-28 | Discharge: 2022-10-28 | Disposition: A | Discharge status: Home / Self Care | Payer: Medicare Other | Source: Ambulatory Visit | Attending: Physician Assistant | Admitting: Physician Assistant

## 2022-10-28 ENCOUNTER — Other Ambulatory Visit: Payer: Self-pay | Admitting: Physician Assistant

## 2022-10-28 DIAGNOSIS — M4807 Spinal stenosis, lumbosacral region: Secondary | ICD-10-CM | POA: Insufficient documentation

## 2022-10-28 DIAGNOSIS — R3129 Other microscopic hematuria: Secondary | ICD-10-CM

## 2022-10-28 NOTE — Telephone Encounter (Signed)
Her most recent urine culture with me was negative, which means that either she never had a UTI or she did have a UTI and the amoxicillin was culture appropriate to treat it. The fact that her symptoms have persisted despite 7 days of amoxicillin earlier this month, which should have been more that adequate to treat it, makes me suspicious that this is not a UTI.  Unfortunately, I'm unable to appropriately evaluate her for infection when they self-treat her with antibiotics before she is seen, as was the case when I saw her earlier this month. It seems that we are now back in the same tricky situation.  Given that she had some microscopic hematuria when I saw her, I think the next best step is to get a renal ultrasound to look for structural abnormalities or swelling in the kidneys that could account for both the blood in her urine and possible recurrent versus persistent UTIs. In the future, I would urge them to defer self-starting antibiotic treatment when they think she is infected and instead make a same-day appointment with [place] for a CATH UA so we can better evaluate her.

## 2022-10-28 NOTE — Telephone Encounter (Signed)
Spoke with son pt is getting [place] today, advised we will call with results

## 2022-10-28 NOTE — Telephone Encounter (Signed)
Order placed. Will call with results when available.

## 2022-10-28 NOTE — Telephone Encounter (Signed)
[name] aware.   She would like to proceed with an renal u/s. Can you please place an order and sign. They would like to get it done today if possible.   Pls advise.   [phone]- [name]- care giver - if we can not get reach [facility].

## 2022-11-02 ENCOUNTER — Ambulatory Visit (INDEPENDENT_AMBULATORY_CARE_PROVIDER_SITE_OTHER): Payer: Medicare Other | Admitting: Urology

## 2022-11-02 DIAGNOSIS — Z87440 Personal history of urinary (tract) infections: Secondary | ICD-10-CM

## 2022-11-02 DIAGNOSIS — R3129 Other microscopic hematuria: Secondary | ICD-10-CM

## 2022-11-02 LAB — URINALYSIS, COMPLETE
Bilirubin, UA: NEGATIVE
Glucose, UA: NEGATIVE
Ketones, UA: NEGATIVE
Leukocytes,UA: NEGATIVE
Nitrite, UA: NEGATIVE
Protein,UA: NEGATIVE
Specific Gravity, UA: 1.02 (ref 1.005–1.030)
Urobilinogen, Ur: 0.2 mg/dL (ref 0.2–1.0)
pH, UA: 5 (ref 5.0–7.5)

## 2022-11-02 LAB — MICROSCOPIC EXAMINATION

## 2022-11-02 NOTE — Progress Notes (Signed)
Patient ID: Kerri [name], female   DOB: 1931-04-12, 89 y.o.   MRN: [id number] In and Out Catheterization  Patient is present today for a I & O catheterization due to Recurrent UTI. Patient was cleaned and prepped in a sterile fashion with betadine . A 14FR cath was inserted no complications were noted , 97ml of urine return was noted, urine was clear in color. A clean urine sample was collected for UA/UCX. Bladder was drained  And catheter was removed with out difficulty.    Performed by: Kerri [name] & Kerri [name], CMA  Follow up/ Additional notes: per results

## 2022-11-03 ENCOUNTER — Telehealth: Payer: Self-pay

## 2022-11-03 DIAGNOSIS — N3281 Overactive bladder: Secondary | ICD-10-CM

## 2022-11-03 MED ORDER — GEMTESA 75 MG PO TABS: 75.0000 mg | ORAL_TABLET | Freq: Every day | ORAL | 0 refills | Status: DC

## 2022-11-03 NOTE — Telephone Encounter (Signed)
RX sent

## 2022-11-03 NOTE — Telephone Encounter (Signed)
Incoming fax from pharmacy stating that Kerri [name] is no longer covered under pt's insurance plan. Please advise on alternative if any.

## 2022-11-06 LAB — CULTURE, URINE COMPREHENSIVE

## 2022-11-08 ENCOUNTER — Encounter: Payer: Self-pay | Admitting: Family Medicine

## 2022-11-08 NOTE — Telephone Encounter (Signed)
Spoke with daughter and advised results. Samples placed at front desk

## 2022-12-01 ENCOUNTER — Telehealth: Payer: Self-pay | Admitting: *Deleted

## 2022-12-01 ENCOUNTER — Ambulatory Visit: Payer: Medicare Other | Admitting: Urology

## 2022-12-01 NOTE — Telephone Encounter (Signed)
After looking over schedule for tomorrow Dr. Diamantina Providence is asking to cancel appt for patient or schedule with PA. The patient's last 2 urine cultures have been negative. If patient is having true symptoms of UTI, can be seen by PA.

## 2022-12-02 ENCOUNTER — Ambulatory Visit (INDEPENDENT_AMBULATORY_CARE_PROVIDER_SITE_OTHER): Payer: Medicare Other | Admitting: Urology

## 2022-12-02 ENCOUNTER — Encounter: Payer: Self-pay | Admitting: Urology

## 2022-12-02 VITALS — BP 116/75 | HR 60

## 2022-12-02 DIAGNOSIS — R3129 Other microscopic hematuria: Secondary | ICD-10-CM

## 2022-12-02 DIAGNOSIS — N390 Urinary tract infection, site not specified: Secondary | ICD-10-CM

## 2022-12-02 DIAGNOSIS — N898 Other specified noninflammatory disorders of vagina: Secondary | ICD-10-CM

## 2022-12-02 DIAGNOSIS — R198 Other specified symptoms and signs involving the digestive system and abdomen: Secondary | ICD-10-CM | POA: Diagnosis not present

## 2022-12-02 MED ORDER — TROSPIUM CHLORIDE ER 60 MG PO CP24: 60.0000 mg | ORAL_CAPSULE | Freq: Every day | ORAL | 11 refills | Status: DC

## 2022-12-02 NOTE — Progress Notes (Signed)
   12/02/2022 4:01 PM   [name] December 01, 1930 [id number]  Reason for visit: Follow up "recurrent UTI" OAB  HPI: 89 year old female with severe dementia here with her daughter and daughter-in-law today.  Her conversation was very challenging today and they are both quite confrontational and upset with her mother's care.  I originally saw him in June 2023 when she had had multiple culture documented UTIs, and symptoms at that time were felt to be confusion and more trouble walking, as well as potentially some increased urgency and frequency of urination.  At that time she was on Detrol for OAB, and I discontinue that medication with her risk for worsening confusion and falls with anticholinergics.  At that time I recommended continuing the trimethoprim prophylaxis, starting topical estrogen cream and cranberry tablets.  She has been seen numerous times by our PA's [name] Vaillancourt and Zara Council since that time.  She has had a few positive cultures however the most recent 2 from 10/12/2022 and 11/02/2022 showed no growth.  I also personally viewed and interpreted the renal ultrasound that was ordered by Thomas Hoff, PA on 10/28/2022 that is benign with no hydronephrosis or other abnormalities.  They also report some new vaginal and rectal discharge today, but they do not feel she has any UTI symptoms.  She had been trialed on [place] but those were not affordable.  I think it is reasonable to trial trospium which should be more reasonably priced, and has significantly lower risk of confusion/falls compared to the other anticholinergics.  I had a very long conversation with her family today about the differences between asymptomatic bacteriuria and true UTI, and the challenges of making that diagnosis in patients who are elderly and have baseline confusion that may change throughout the days and weeks.  Most recent urinalysis shows 3-10 RBC but otherwise benign, and culture  negative.  With her vaginal and rectal discharge, we discussed the possibility of a fistula or other abscess that could be contributing to her confusion, especially in the setting of multiple negative urine cultures recently.  They were amenable to pursuing a CT urogram, and potentially cystoscopy in the future.  I do not think she would tolerate cystoscopy well, and low threshold that this would be helpful if CT is completely benign.  Continue trimethoprim prophylaxis Continue topical estrogen cream and cranberry tablets CT urogram, call with results   I spent 45 total minutes on the day of the encounter including pre-visit review of the medical record, face-to-face time with the patient, and post visit ordering of labs/imaging/tests.  Billey Co, [facility] [address] [place], [place] [address] [phone]

## 2022-12-02 NOTE — Patient Instructions (Signed)
Asymptomatic Bacteriuria Asymptomatic bacteriuria is the presence of a large number of bacteria in the urine without the usual symptoms of burning or frequent urination. This is usually not harmful, and treatment may not be needed. A person with this condition will not be more likely to develop an infection in the future. What are the causes? This condition is caused by an increase in bacteria in the urine. This increase can be caused by: Bacteria entering the urinary tract, such as during sex. A blockage in the urinary tract, such as from kidney stones or a tumor. Bladder problems that prevent the bladder from emptying. What increases the risk? You are more likely to develop this condition if: You have diabetes. You are an older adult. This especially affects older adults in long-term care facilities. You are pregnant and in the first trimester. You have kidney stones. You are female. You have had a kidney transplant. You have a leaky kidney tube valve (reflux). You had a urinary catheter for a long period of time. This is a long, thin tube that collects urine. What are the signs or symptoms? There are no symptoms of this condition. How is this diagnosed? This condition is diagnosed with a urine test. Because this condition does not cause symptoms, it is usually diagnosed when a urine sample is taken to treat or diagnose another condition, such as pregnancy or kidney problems. Most women who are in their first trimester of pregnancy are screened for asymptomatic bacteriuria. How is this treated? Usually, treatment is not needed for this condition. Treating the condition can lead to other problems, such as a yeast infection or the growth of bacteria that do not respond to treatment (antibiotic-resistant bacteria). Some people do need treatment with antibiotic medicines to prevent kidney infection, known as pyelonephritis. Treatment is needed if: You are pregnant. In pregnant women, kidney  infection can lead to: Early labor (premature labor). Very low birth weight (fetal growth restriction). Newborn death. You are having a procedure that affects the urinary tract. You have had a kidney transplant. If you are diagnosed with this condition, talk with your health care provider about any concerns that you have. Follow these instructions at home: Medicines Take over-the-counter and prescription medicines only as told by your health care provider. If you were prescribed an antibiotic medicine, take it as told by your health care provider. Do not stop using the antibiotic even if you start to feel better. General instructions Monitor your condition for any changes. Drink enough fluid to keep your urine pale yellow. Urinate more often to keep your bladder empty. If you are female, keep the area around your vagina and rectum clean. Wipe from front to back after urinating or having a bowel movement. Use each piece of toilet paper only once. Keep all follow-up visits. This is important. Contact a health care provider if: You have symptoms of a urine infection, such as: A burning sensation, or pain when you urinate. A strong need to urinate, or urinating more often. Urine turning discolored or cloudy. Blood in your urine. Urine that smells bad. Get help right away if: You develop signs of a kidney infection, such as: Back pain or pelvic pain. A fever or chills. Nausea or vomiting. Severe pain that cannot be controlled with medicine. Summary Asymptomatic bacteriuria is the presence of a large number of bacteria in the urine without the usual symptoms of burning or frequent urination. Usually, treatment is not needed for this condition. Treating the condition can lead  to other problems, such as a yeast infection or the growth of bacteria that do not respond to treatment. Some people do need treatment. Treatment is needed if you are pregnant, if you are having a procedure that  affects the urinary tract, or if you have had a kidney transplant. If you were prescribed an antibiotic medicine, take it as told by your health care provider. Do not stop using the antibiotic even if you start to feel better. This information is not intended to replace advice given to you by your health care provider. Make sure you discuss any questions you have with your health care provider. Document Revised: 05/09/2020 Document Reviewed: 05/09/2020 Elsevier Patient Education  [employer].

## 2022-12-03 MED ORDER — TROSPIUM CHLORIDE ER 60 MG PO CP24: 60.0000 mg | ORAL_CAPSULE | Freq: Every day | ORAL | 11 refills | Status: AC

## 2022-12-03 NOTE — Addendum Note (Signed)
Addended by: Kris Mouton on: 12/03/2022 08:21 AM   Modules accepted: Orders

## 2023-01-07 ENCOUNTER — Ambulatory Visit: Admission: RE | Admit: 2023-01-07 | Payer: Medicare Other | Source: Ambulatory Visit

## 2023-02-17 ENCOUNTER — Telehealth: Payer: Self-pay | Admitting: *Deleted

## 2023-02-17 MED ORDER — CIPROFLOXACIN HCL 250 MG PO TABS: 250.0000 mg | ORAL_TABLET | Freq: Two times a day (BID) | ORAL | 0 refills | Status: AC

## 2023-02-17 NOTE — Telephone Encounter (Signed)
Spoke with patient and advised results rx sent to pharmacy by e-script CT was scheduled but they canceled for family, gave daughter number to schedule CT again   Per Dr. Richardo Hanks    02/17/23 10:16 AM She is elderly and quite frail and usually cannot give a urinalysis.  Okay to send in Cipro 250 mg twice daily x 5 days.  Typically we do not call in antibiotics over the phone, but with her frailty and our challenges with availability this week, and fine doing a one-time over the phone antibiotic  Per my last note I recommend continuing the trimethoprim prophylaxis, topical estrogen cream 3 times weekly, and cranberry tablets.  I also ordered a CT scan that has not yet been scheduled, can you help facilitate with radiology, will call with CT scan results when back  Legrand Rams, MD 02/17/2023

## 2023-02-17 NOTE — Telephone Encounter (Signed)
Daughter and son calling stating that their mother is having UTI symptoms, bad motor skills, abnormal gait, odor. Daughter states if she doesn't get appt today pt will go into septis. Per daughter they where told to stop the estrace cream, she is still taking cranberry tablets and Trimpex. Please advise

## 2023-03-02 ENCOUNTER — Telehealth: Payer: Self-pay

## 2023-03-02 NOTE — Telephone Encounter (Signed)
Call left on vm.   Synetta Fail- DIL states the ATB [facility] gave pt helped for only a few days. She is having sx again. Appt with PCP today at 115. Can she be seen today.   PCP gave pt ATB for 7 days did b/w.   Synetta Fail will call back if pt still has sx after ATB completion.   Sheliah Mends number to centralized scheduling to schedule CT.

## 2023-08-26 ENCOUNTER — Other Ambulatory Visit: Payer: Self-pay | Admitting: Urology

## 2023-08-26 DIAGNOSIS — N390 Urinary tract infection, site not specified: Secondary | ICD-10-CM

## 2023-09-16 ENCOUNTER — Encounter: Payer: Self-pay | Admitting: Physician Assistant

## 2023-09-16 ENCOUNTER — Ambulatory Visit (INDEPENDENT_AMBULATORY_CARE_PROVIDER_SITE_OTHER): Payer: Medicare Other | Admitting: Physician Assistant

## 2023-09-16 VITALS — BP 146/75 | HR 61 | Ht 68.0 in | Wt 170.0 lb

## 2023-09-16 DIAGNOSIS — N390 Urinary tract infection, site not specified: Secondary | ICD-10-CM

## 2023-09-16 DIAGNOSIS — R82998 Other abnormal findings in urine: Secondary | ICD-10-CM | POA: Diagnosis not present

## 2023-09-16 DIAGNOSIS — R42 Dizziness and giddiness: Secondary | ICD-10-CM | POA: Diagnosis not present

## 2023-09-16 DIAGNOSIS — R269 Unspecified abnormalities of gait and mobility: Secondary | ICD-10-CM | POA: Diagnosis not present

## 2023-09-16 LAB — MICROSCOPIC EXAMINATION: WBC, UA: >30 /[HPF] — AB (ref 0–5)

## 2023-09-16 LAB — URINALYSIS, COMPLETE
Bilirubin, UA: NEGATIVE
Glucose, UA: NEGATIVE
Ketones, UA: NEGATIVE
Nitrite, UA: POSITIVE — AB
Protein,UA: NEGATIVE
Specific Gravity, UA: 1.015 (ref 1.005–1.030)
Urobilinogen, Ur: 0.2 mg/dL (ref 0.2–1.0)
pH, UA: 5.5 (ref 5.0–7.5)

## 2023-09-16 MED ORDER — SULFAMETHOXAZOLE-TRIMETHOPRIM 800-160 MG PO TABS: 1.0000 | ORAL_TABLET | Freq: Two times a day (BID) | ORAL | 0 refills | Status: AC

## 2023-09-16 NOTE — Patient Instructions (Signed)
Please call [phone] to schedule the CT scan that Dr. Richardo Hanks ordered for you earlier this year.

## 2023-09-16 NOTE — Progress Notes (Signed)
09/16/2023 11:14 AM   Kerri [name] Feb 23, 1931 [id number]  CC: Chief Complaint  Patient presents with   Recurrent UTI   HPI: Kerri [name] is a 89 y.o. female with PMH normal pressure hydrocephalus, dementia, bacteriuria of unclear significance previously on suppressive trimethoprim, and OAB wet on trospium ER 60 mg who presents today for evaluation of possible UTI.  She is accompanied today by her daughter and daughter-in-law, who contribute to HPI.   Today they report 2 days of malodorous urine, confusion, and worsening gait.  They think she ran out of suppressive trimethoprim about a week ago.  Richardo Hanks previously ordered CT urogram when he saw them in February based on reports of vaginal and rectal discharge suggestive of possible fistula.  This has not yet been scheduled.  They report that the patient was diagnosed with fecal impaction thereafter and the discharge has largely resolved.  In-office catheterized UA today positive for trace intact blood, nitrites, and 2+ leukocytes; urine microscopy with >30 WBCs/HPF, 3-10 RBCs/HPF, and many bacteria.  PMH: Past Medical History:  Diagnosis Date   Arthritis     Surgical History: Past Surgical History:  Procedure Laterality Date   INTRAMEDULLARY (IM) NAIL INTERTROCHANTERIC Left 09/06/2022   Procedure: INTRAMEDULLARY (IM) NAIL INTERTROCHANTERIC;  Surgeon: Signa Kell, MD;  Location: [facility];  Service: Orthopedics;  Laterality: Left;   PACEMAKER LEADLESS INSERTION N/A 01/07/2021   Procedure: PACEMAKER LEADLESS INSERTION;  Surgeon: Marcina Millard, MD;  Location: [facility];  Service: Cardiovascular;  Laterality: N/A;   PPM GENERATOR REMOVAL N/A 01/07/2021   Procedure: PPM GENERATOR REMOVAL;  Surgeon: Marcina Millard, MD;  Location: [facility];  Service: Cardiovascular;  Laterality: N/A;    Home Medications:  Allergies as of 09/16/2023       Reactions   Alendronate Sodium Other (See Comments)    Bupropion Other (See Comments)   Tremors    Rofecoxib Diarrhea   Alendronate Rash        Medication List        Accurate as of September 16, 2023 11:14 AM. If you have any questions, ask your nurse or doctor.          acetaminophen 325 MG tablet Commonly known as: TYLENOL Take 2 tablets (650 mg total) by mouth every 6 (six) hours as needed for mild pain, moderate pain or fever.   bisacodyl 10 MG suppository Commonly known as: DULCOLAX Place 1 suppository (10 mg total) rectally daily as needed for moderate constipation.   chlorhexidine 4 % external liquid Commonly known as: Hibiclens Apply topically daily as needed.   cyanocobalamin 1000 MCG/ML injection Commonly known as: VITAMIN B12 Inject 1,000 mcg into the muscle every 30 (thirty) days.   docusate sodium 100 MG capsule Commonly known as: COLACE Take 1 capsule (100 mg total) by mouth 2 (two) times daily.   donepezil 10 MG tablet Commonly known as: ARICEPT Take 10 mg by mouth at bedtime.   Fe Fum-Vit C-Vit B12-FA Caps capsule Commonly known as: TRIGELS-F FORTE Take 1 capsule by mouth 2 (two) times daily.   fluticasone 50 MCG/ACT nasal spray Commonly known as: FLONASE 2 sprays in both nostrils   folic acid 1 MG tablet Commonly known as: FOLVITE Take 1 mg by mouth daily.   levothyroxine 50 MCG tablet Commonly known as: SYNTHROID Take 50 mcg by mouth daily before breakfast.   memantine 10 MG tablet Commonly known as: NAMENDA Take 10 mg by mouth 2 (two) times daily.  metoprolol succinate 25 MG 24 hr tablet Commonly known as: TOPROL-XL Take 25 mg by mouth daily.   multivitamin with minerals Tabs tablet Take 1 tablet by mouth daily.   polyethylene glycol powder 17 GM/SCOOP powder Commonly known as: GLYCOLAX/MIRALAX Take 17 g by mouth daily.   potassium chloride 10 MEQ tablet Commonly known as: KLOR-CON Take 10 mEq by mouth once a week. Every Monday   trimethoprim 100 MG tablet Commonly known  as: TRIMPEX Take 1 tablet (100 mg total) by mouth daily.   Trospium Chloride 60 MG Cp24 Take 1 capsule (60 mg total) by mouth daily.   venlafaxine XR 75 MG 24 hr capsule Commonly known as: EFFEXOR-XR Take 75 mg by mouth daily with breakfast.        Allergies:  Allergies  Allergen Reactions   Alendronate Sodium Other (See Comments)   Bupropion Other (See Comments)    Tremors      Rofecoxib Diarrhea   Alendronate Rash    Family History: No family history on file.  Social History:   reports that she has never smoked. She has never been exposed to tobacco smoke. She has never used smokeless tobacco. She reports that she does not drink alcohol and does not use drugs.  Physical Exam: BP (!) 146/75   Pulse 61   Ht 5\' 8"  (1.727 m)   Wt 170 lb (77.1 kg)   BMI 25.85 kg/m   Constitutional:  Alert and oriented, no acute distress, nontoxic appearing HEENT: [place], AT Cardiovascular: No clubbing, cyanosis, or edema Respiratory: Normal respiratory effort, no increased work of breathing Skin: No rashes, bruises or suspicious lesions Neurologic: Grossly intact, no focal deficits, moving all 4 extremities Psychiatric: Normal mood and affect  Laboratory Data: Results for orders placed or performed in visit on 09/16/23  Microscopic Examination   Urine  Result Value Ref Range   WBC, UA >30 (A) 0 - 5 /hpf   RBC, Urine 3-10 (A) 0 - 2 /hpf   Epithelial Cells (non renal) 0-10 0 - 10 /hpf   Bacteria, UA Many (A) None seen/Few  Urinalysis, Complete  Result Value Ref Range   Specific Gravity, UA 1.015 1.005 - 1.030   pH, UA 5.5 5.0 - 7.5   Color, UA Yellow Yellow   Appearance Ur Cloudy (A) Clear   Leukocytes,UA 2+ (A) Negative   Protein,UA Negative Negative/Trace   Glucose, UA Negative Negative   Ketones, UA Negative Negative   RBC, UA Trace (A) Negative   Bilirubin, UA Negative Negative   Urobilinogen, Ur 0.2 0.2 - 1.0 mg/dL   Nitrite, UA Positive (A) Negative   Microscopic  Examination See below:    In and Out Catheterization  Patient is present today for a I & O catheterization due to rUTI. Patient was cleaned and prepped in a sterile fashion with betadine . A 14FR cath was inserted no complications were noted , 50ml of urine return was noted, urine was yellow in color. A clean urine sample was collected for UA, cx. Catheter was removed without difficulty.    Performed by: [name], PA-C   Assessment & Plan:   1. Recurrent UTI Cath UA appears grossly infected consistent with colonization versus acute cystitis.  I do not think we are ever going to be able to get to the bottom of whether or not she is having true infections with her history of dementia and NPH.  Will start empiric Bactrim and send for culture for further evaluation.  Will  plan to resume suppressive antibiotics per culture results.  I given the contact number for imaging scheduling to pursue CT urogram as previously ordered, however ultimately up to them.  Okay to defer per patient preference given essentially resolution of her previously reported discharge. - Urinalysis, Complete - CULTURE, URINE COMPREHENSIVE - sulfamethoxazole-trimethoprim (BACTRIM DS) 800-160 MG tablet; Take 1 tablet by mouth 2 (two) times daily for 5 days.  Dispense: 10 tablet; Refill: 0   Return for Will call to start suppressive abx per cx results.  [name], PA-C  [facility] [place] [address], [address] [place], [place] [address] [phone]

## 2023-09-19 LAB — CULTURE, URINE COMPREHENSIVE

## 2023-10-17 ENCOUNTER — Other Ambulatory Visit: Payer: Self-pay | Admitting: Urology

## 2023-10-17 DIAGNOSIS — N390 Urinary tract infection, site not specified: Secondary | ICD-10-CM

## 2023-11-16 ENCOUNTER — Encounter: Payer: Self-pay | Admitting: Physical Therapy

## 2023-11-16 ENCOUNTER — Ambulatory Visit: Payer: Medicare Other | Attending: Family Medicine | Admitting: Physical Therapy

## 2023-11-16 ENCOUNTER — Other Ambulatory Visit: Payer: Self-pay

## 2023-11-16 DIAGNOSIS — R269 Unspecified abnormalities of gait and mobility: Secondary | ICD-10-CM | POA: Insufficient documentation

## 2023-11-16 DIAGNOSIS — M6281 Muscle weakness (generalized): Secondary | ICD-10-CM | POA: Insufficient documentation

## 2023-11-16 DIAGNOSIS — R262 Difficulty in walking, not elsewhere classified: Secondary | ICD-10-CM | POA: Insufficient documentation

## 2023-11-16 DIAGNOSIS — R5381 Other malaise: Secondary | ICD-10-CM | POA: Insufficient documentation

## 2023-11-16 NOTE — Therapy (Signed)
 OUTPATIENT PHYSICAL THERAPY NEURO EVALUATION   Patient Name: Kerri [name] MRN: [id number] DOB:1931/02/10, 89 y.o., female Today's Date: 11/17/2023   PCP: Cyrus Selinda Moose, PA-C  REFERRING PROVIDER: Cyrus Selinda Moose, PA-C   END OF SESSION:  PT End of Session - 11/16/23 1449     Visit Number 1    Number of Visits 24    Date for PT Re-Evaluation 02/08/24    PT Start Time 1448    PT Stop Time 1530    PT Time Calculation (min) 42 min    Equipment Utilized During Treatment Gait belt    Activity Tolerance Patient tolerated treatment well    Behavior During Therapy WFL for tasks assessed/performed             Past Medical History:  Diagnosis Date   Arthritis    Past Surgical History:  Procedure Laterality Date   INTRAMEDULLARY (IM) NAIL INTERTROCHANTERIC Left 09/06/2022   Procedure: INTRAMEDULLARY (IM) NAIL INTERTROCHANTERIC;  Surgeon: Tobie Priest, MD;  Location: [facility];  Service: Orthopedics;  Laterality: Left;   PACEMAKER LEADLESS INSERTION N/A 01/07/2021   Procedure: PACEMAKER LEADLESS INSERTION;  Surgeon: Ammon Blunt, MD;  Location: [facility];  Service: Cardiovascular;  Laterality: N/A;   PPM GENERATOR REMOVAL N/A 01/07/2021   Procedure: PPM GENERATOR REMOVAL;  Surgeon: Ammon Blunt, MD;  Location: [facility];  Service: Cardiovascular;  Laterality: N/A;   Patient Active Problem List   Diagnosis Date Noted   Acute postoperative anemia due to expected blood loss 09/07/2022   Displaced intertrochanteric fracture of left femur, initial encounter for closed fracture (HCC) 09/05/2022   Orthostatic hypotension 07/12/2022   Diarrhea 07/10/2022   COVID-19 virus infection 07/08/2022   Electrolyte abnormality 07/08/2022   Malnutrition of moderate degree 07/08/2022   Acute delirium 07/08/2022   History of recurrent UTI (urinary tract infection) 01/30/2022   Constipation    Elevated brain natriuretic peptide (BNP) level  10/15/2021   Generalized weakness 10/15/2021   Chronic kidney disease, stage 3b (HCC) 10/15/2021   Normal pressure hydrocephalus (HCC) 10/15/2021   Dementia without behavioral disturbance (HCC) 10/15/2021   Hypothyroidism 10/15/2021   Essential hypertension 10/15/2021   Overactive bladder 10/15/2021   Moderate mitral regurgitation 01/13/2021   Status post placement of cardiac pacemaker 01/13/2021   CHB (complete heart block) (HCC) 01/07/2021   Chronic systolic CHF (congestive heart failure) (HCC) 12/17/2020   Postmenopausal osteoporosis 08/26/2016   Mixed Alzheimer's and vascular dementia (HCC) 06/13/2014    ONSET DATE: getting worse over the last year   REFERRING DIAG:  Diagnosis  M51.369 (ICD-10-CM) - Degeneration of lumbar intervertebral disc    THERAPY DIAG:  Debility  Muscle weakness (generalized)  Difficulty in walking, not elsewhere classified  Abnormality of gait and mobility  Rationale for Evaluation and Treatment: Rehabilitation  SUBJECTIVE:  SUBJECTIVE STATEMENT: Hx given from care giver.  Hx of L femur fx roughly a year and half ago. Hx of lumbar stenosis and BLE weakness. States that they want PT to address pain and weakness to allow continued mobility as tolerated. Care giver reports that she well complain of pain/numbness with mobility, but more willing to attemept to mobilize if its something she wants  Will self propel in [employer] [facility] at home  Pt accompanied by:  care giver   PERTINENT HISTORY:   From recent Neurology visit from MD.  1. Mixed dementia (Alzheimer's disease + vascular dementia) with significant ventriculomegaly (with negative lumbar drain trial in 2015) in a patient with history of sepsis secondary to UTI treated with trimethoprim  January 2023. Medication appears  to help with UTI and improved mental status - reports good control with current medication - Continue Donepezil  10 mg by mouth once a day  - Continue Memantine  10 mg by mouth two times a day   - Continue keeping patient involved in daily activities, such as reading out loud, helping prepare meals, picking out clothes.  - Recommend patient does not live alone. Patient should have 24/7 supervision.   2. Right leg pain with inability to pick up feet (right>left - likely from ventriculomegaly + peripheral neuropathy) patient with history of lumbar disc disease with stenosis - reports improvement during recent trial of prednisone. Suspicious for lumbar Disc Disease in addition to decondition Reviewed PCP note: Ongoing leg pain with intermittent weakness. On exam she does have some swelling around the right knee. Unsure if she could have some severe arthritis catching as she walks. Will obtain x-ray of the right knee today. Trial of prednisone 20 mg once a day for 5 days. We did discuss that she has some degenerative lumbar disc disease with stenosis and likely playing a role in right leg weakness. She has a pacemaker and we will defer MRI of the lumbar spine. Keep follow-up with neurology and if they feel it is necessary they could possibly pursue nerve conduction study. Inform caretaker to help patient and stay by her side when walking.   - Patient says that her legs feel heavy and that she has difficulty initiating the first step when walking - consistent with magnetic gait in patient with hydrocephalus and brain atrophy - Previous (-) Lumbar Puncture trial at [facility] (01/09/14).   PAIN:  Are you having pain? No  PRECAUTIONS: Fall  RED FLAGS: None   WEIGHT BEARING RESTRICTIONS: No  FALLS: Has patient fallen in last 6 months? Yes. Number of falls 2-3 controlled falls with assist to floor.    LIVING ENVIRONMENT: Lives with:  caregiver 7 days a week. Alone for an hour or 2 a day, but only when  alseep.   Lives in: House/apartment Stairs: Yes: Internal: 1 but ramp in place steps; none and External: 1 steps; none Has following equipment at home: Walker - 2 wheeled  PLOF: Requires assistive device for independence, Needs assistance with ADLs, Needs assistance with homemaking, Needs assistance with gait, and Needs assistance with transfers  PATIENT GOALS: get stronger so she can go and do   OBJECTIVE:  Note: Objective measures were completed at Evaluation unless otherwise noted.  DIAGNOSTIC FINDINGS:   EXAM: CT LUMBAR SPINE WITHOUT CONTRAST IMPRESSION: 1. Transitional anatomy. Adhering to convention from the prior lumbar spine MRI, the last fully formed disc space is labeled L5-S1. 2. Unchanged chronic compression fractures of L4 and L5. No new fracture of the lumbar spine. 3.  Unchanged lumbar spondylosis notable for moderate narrowing of the right lateral recess at L1-2 and L2-3, as well as mild spinal canal stenosis at L2-3 and L3-4.   COGNITION: Overall cognitive status: History of cognitive impairments - at baseline   SENSATION: WFL  COORDINATION: Decreased speed of movement. Decreased ability to follow instructions with MMT   EDEMA:  Mild BLE distal edema   MUSCLE TONE: WFL  MUSCLE LENGTH: Generalized tightness from sedentary   DTRs:  Not assessed   POSTURE: rounded shoulders, forward head, increased lumbar lordosis, and increased thoracic kyphosis  LOWER EXTREMITY ROM:     Unable formally assess due to    LOWER EXTREMITY MMT:    MMT Right Eval Left Eval  Difficult to assess due to cognitive impairments, grossly 3+/5 to 4/5 proximal to distal with functional movement    BED MOBILITY:  To be assessed at later visit.   TRANSFERS: Assistive device utilized: Environmental Consultant - 2 wheeled  Sit to stand: Mod A Stand to sit: Mod A Chair to chair: Mod A Floor:  unable to complete without total A per care giver   RAMP:  Level of Assistance: Mod  A Assistive device utilized: Environmental Consultant - 2 wheeled Ramp Comments: caregiver reports use of RW to step down in to home entry   CURB:  Level of Assistance:  not assessed at eval  Assistive device utilized:  rails or RW  Curb Comments: care giver reports use of RW   STAIRS: Level of Assistance:  not assessed at [employer] Technique: Step to Pattern with Bilateral Rails Number of Stairs:   Height of Stairs:   Comments: no use of stairs at home for >1.5 years   GAIT: Gait pattern: decreased step length- Right, decreased stance time- Left, decreased hip/knee flexion- Right, knee flexed in stance- Right, knee flexed in stance- Left, and poor foot clearance- Right Distance walked: 20 Assistive device utilized: Walker - 2 wheeled Level of assistance: Min A and Mod A Comments: R foot drag with TUG. Increased flexed knee with fatigue.   FUNCTIONAL TESTS:  5 times sit to stand: 54 Timed up and go (TUG): 2:24 with min-mod assost  10 meter walk test: TBD   PATIENT SURVEYS:  PsFS.                                                                                                                               TREATMENT DATE:   Eval and HEP as listed below      PATIENT EDUCATION: Education details: POC. HEP to improve generalized strength and activity tolerance  Person educated: Patient and Caregiver   Education method: Medical Illustrator Education comprehension: verbalized understanding  HOME EXERCISE PROGRAM: Access Code: [email or web address] URL: [email or web address] Date: 11/16/2023 Prepared by: Massie Dollar  Exercises - Seated Long Arc Quad  - 1 x daily - 7 x weekly - 3 sets - 10 reps - Seated March  - 1 x  daily - 7 x weekly - 3 sets - 10 reps - 2 hold - Seated Hip Abduction  - 1 x daily - 7 x weekly - 3 sets - 10 reps - 2 hold - Supine Heel Slide  - 1 x daily - 7 x weekly - 3 sets - 10 reps - 3 hold - Supine Hip Abduction AROM  - 1 x daily - 7 x weekly  - 3 sets - 10 reps - 3 hold - Supine Straight Leg Raises  - 1 x daily - 7 x weekly - 3 sets - 10 reps - 3 hold  GOALS: Goals reviewed with patient? Yes   SHORT TERM GOALS: Target date: 12/15/2023    Patient will be independent in home exercise program to improve strength/mobility for better functional independence with ADLs. Baseline: provided on 11/16/23 Goal status: INITIAL   LONG TERM GOALS: Target date: 02/09/2024    Patient will increase PSFS score to equal to or greater than and average of 2 points   to demonstrate statistically significant improvement in mobility and quality of life.  Baseline: to be completed  Goal status: INITIAL  2.  Patient (> 46 years old) will complete five times sit to stand test in < 30 seconds indicating an increased LE strength and improved balance. Baseline: 54 sec with UE support and RW  Goal status: INITIAL    3.  Patient will increase 10 meter walk test by 0.74m/s as to improve gait speed for better community ambulation and to reduce fall risk. Baseline: to be completed  Goal status: INITIAL  4.  Patient will reduce timed up and go to <60 sec seconds to reduce fall risk and demonstrate improved transfer/gait ability. Baseline: 2:24 min with RW and min-mod assist  Goal status: INITIAL     ASSESSMENT:  CLINICAL IMPRESSION: Patient is a 89 y.o. female who was seen today for physical therapy evaluation and treatment for debility secondary to strength and sensory changes from degeneration of lumbar discs, LLE pain, R hip pain and difficulty walking. Pt has hx of baseline cognitive impairments, L femur fx, HTN, and pacemaker placement. Pt demonstrates significant debility with Tug >2 min, 5x STS 54 sec with RW and min-mod assist, and min-mod assist for stand pivot transfers. Pt provided with seated and bed level HEP to address strength deficits. Pt will benefit from skilled tp PT address strength and balance impairments, to improve safety with bed  mobility, transfers, gait and allow improvement on overall QoL and reduce care giver burden.   OBJECTIVE IMPAIRMENTS: Abnormal gait, decreased activity tolerance, decreased balance, decreased cognition, decreased coordination, decreased endurance, decreased knowledge of condition, decreased mobility, difficulty walking, decreased ROM, decreased strength, decreased safety awareness, hypomobility, impaired flexibility, impaired vision/preception, improper body mechanics, postural dysfunction, and pain.   ACTIVITY LIMITATIONS: carrying, lifting, bending, standing, squatting, stairs, transfers, bed mobility, continence, bathing, toileting, dressing, hygiene/grooming, and locomotion level  PARTICIPATION LIMITATIONS: cleaning, laundry, shopping, and community activity  PERSONAL FACTORS: Age, Behavior pattern, Fitness, and 3+ comorbidities: HTN, L femur fx, dimentia  are also affecting patient's functional outcome.   REHAB POTENTIAL: Good  CLINICAL DECISION MAKING: Evolving/moderate complexity  EVALUATION COMPLEXITY: Moderate  PLAN:  PT FREQUENCY: 1-2x/week  PT DURATION: 12 weeks  PLANNED INTERVENTIONS: 97164- PT Re-evaluation, 97110-Therapeutic exercises, 97530- Therapeutic activity, 97112- Neuromuscular re-education, 97535- Self Care, [id number]- Manual therapy, [phone]- Gait training, Balance training, Stair training, Visual/preceptual remediation/compensation, Cognitive remediation, DME instructions, Wheelchair mobility training, Cryotherapy, and Moist heat  PLAN FOR NEXT  SESSION:  Complete 10 m WT, PsFS, review HEP. Assess bed mobility    Massie FORBES Dollar, PT 11/17/2023, 7:38 AM

## 2023-11-22 ENCOUNTER — Ambulatory Visit: Payer: Medicare Other | Admitting: Physical Therapy

## 2023-11-22 DIAGNOSIS — M6281 Muscle weakness (generalized): Secondary | ICD-10-CM

## 2023-11-22 DIAGNOSIS — R5381 Other malaise: Secondary | ICD-10-CM | POA: Diagnosis not present

## 2023-11-22 DIAGNOSIS — R269 Unspecified abnormalities of gait and mobility: Secondary | ICD-10-CM

## 2023-11-22 DIAGNOSIS — R262 Difficulty in walking, not elsewhere classified: Secondary | ICD-10-CM

## 2023-11-22 NOTE — Therapy (Signed)
OUTPATIENT PHYSICAL THERAPY NEURO Treatment   Patient Name: Kerri [name] MRN: [id number] DOB:04/08/1931, 89 y.o., female Today's Date: 11/22/2023   PCP: Wilford Corner, PA-C  REFERRING PROVIDER: Wilford Corner, PA-C   END OF SESSION:  PT End of Session - 11/22/23 1152     Visit Number 2    Number of Visits 24    Date for PT Re-Evaluation 02/08/24    PT Start Time 1150    PT Stop Time 1230    PT Time Calculation (min) 40 min    Equipment Utilized During Treatment Gait belt    Activity Tolerance Patient tolerated treatment well    Behavior During Therapy WFL for tasks assessed/performed             Past Medical History:  Diagnosis Date   Arthritis    Past Surgical History:  Procedure Laterality Date   INTRAMEDULLARY (IM) NAIL INTERTROCHANTERIC Left 09/06/2022   Procedure: INTRAMEDULLARY (IM) NAIL INTERTROCHANTERIC;  Surgeon: Signa Kell, MD;  Location: [facility] ORS;  Service: Orthopedics;  Laterality: Left;   PACEMAKER LEADLESS INSERTION N/A 01/07/2021   Procedure: PACEMAKER LEADLESS INSERTION;  Surgeon: Marcina Millard, MD;  Location: [facility];  Service: Cardiovascular;  Laterality: N/A;   PPM GENERATOR REMOVAL N/A 01/07/2021   Procedure: PPM GENERATOR REMOVAL;  Surgeon: Marcina Millard, MD;  Location: [facility];  Service: Cardiovascular;  Laterality: N/A;   Patient Active Problem List   Diagnosis Date Noted   Acute postoperative anemia due to expected blood loss 09/07/2022   Displaced intertrochanteric fracture of left femur, initial encounter for closed fracture (HCC) 09/05/2022   Orthostatic hypotension 07/12/2022   Diarrhea 07/10/2022   COVID-19 virus infection 07/08/2022   Electrolyte abnormality 07/08/2022   Malnutrition of moderate degree 07/08/2022   Acute delirium 07/08/2022   History of recurrent UTI (urinary tract infection) 01/30/2022   Constipation    Elevated brain natriuretic peptide (BNP) level  10/15/2021   Generalized weakness 10/15/2021   Chronic kidney disease, stage 3b (HCC) 10/15/2021   Normal pressure hydrocephalus (HCC) 10/15/2021   Dementia without behavioral disturbance (HCC) 10/15/2021   Hypothyroidism 10/15/2021   Essential hypertension 10/15/2021   Overactive bladder 10/15/2021   Moderate mitral regurgitation 01/13/2021   Status post placement of cardiac pacemaker 01/13/2021   CHB (complete heart block) (HCC) 01/07/2021   Chronic systolic CHF (congestive heart failure) (HCC) 12/17/2020   Postmenopausal osteoporosis 08/26/2016   Mixed Alzheimer's and vascular dementia (HCC) 06/13/2014    ONSET DATE: getting worse over the last year   REFERRING DIAG:  Diagnosis  M51.369 (ICD-10-CM) - Degeneration of lumbar intervertebral disc    THERAPY DIAG:  Debility  Muscle weakness (generalized)  Difficulty in walking, not elsewhere classified  Abnormality of gait and mobility  Rationale for Evaluation and Treatment: Rehabilitation  SUBJECTIVE:  SUBJECTIVE STATEMENT:  Pt reports that she is doing well; no pain at start of PT session. Care giver reports that she has been completing HEP at bed level in the morning since Eval.  Has questions regarding RW height to reduce fall risk    From Eval:  Hx given from care giver; [name] .  Hx of L femur fx roughly a year and half ago. Hx of lumbar stenosis and BLE weakness. States that they want PT to address pain and weakness to allow continued mobility as tolerated. Care giver reports that she well complain of pain/numbness with mobility, but more willing to attemept to mobilize if "its something she wants"  Will self propel in [facility] at home  Pt accompanied by:  care giver   PERTINENT HISTORY:   From recent Neurology visit from MD.  "1. Mixed  dementia (Alzheimer's disease + vascular dementia) with significant ventriculomegaly (with negative lumbar drain trial in 2015) in a patient with history of sepsis secondary to UTI treated with trimethoprim January 2023. Medication appears to help with UTI and improved mental status - reports good control with current medication - Continue Donepezil 10 mg by mouth once a day  - Continue Memantine 10 mg by mouth two times a day   - Continue keeping patient involved in daily activities, such as reading out loud, helping prepare meals, picking out clothes.  - Recommend patient does not live alone. Patient should have 24/7 supervision.   2. Right leg pain with inability to pick up feet (right>left - likely from ventriculomegaly + peripheral neuropathy) patient with history of lumbar disc disease with stenosis - reports improvement during recent trial of prednisone. Suspicious for lumbar Disc Disease in addition to decondition Reviewed PCP note: Ongoing leg pain with intermittent weakness. On exam she does have some swelling around the right knee. Unsure if she could have some severe arthritis catching as she walks. Will obtain x-ray of the right knee today. Trial of prednisone 20 mg once a day for 5 days. We did discuss that she has some degenerative lumbar disc disease with stenosis and likely playing a role in right leg weakness. She has a pacemaker and we will defer MRI of the lumbar spine. Keep follow-up with neurology and if they feel it is necessary they could possibly pursue nerve conduction study. Inform caretaker to help patient and stay by her side when walking.   - Patient says that her legs feel "heavy" and that she has difficulty initiating the first step when walking - consistent with magnetic gait in patient with hydrocephalus and brain atrophy - Previous (-) Lumbar Puncture trial at [facility] (01/09/14)."   PAIN:  Are you having pain? No  PRECAUTIONS: Fall  RED FLAGS: None   WEIGHT  BEARING RESTRICTIONS: No  FALLS: Has patient fallen in last 6 months? Yes. Number of falls 2-3 controlled falls with assist to floor.    LIVING ENVIRONMENT: Lives with:  caregiver 7 days a week. Alone for an hour or 2 a day, but only when alseep.   Lives in: House/apartment Stairs: Yes: Internal: 1 but ramp in place steps; none and External: 1 steps; none Has following equipment at home: Walker - 2 wheeled  PLOF: Requires assistive device for independence, Needs assistance with ADLs, Needs assistance with homemaking, Needs assistance with gait, and Needs assistance with transfers  PATIENT GOALS: get stronger so she can "go and do"   OBJECTIVE:  Note: Objective measures were completed at Evaluation unless otherwise noted.  DIAGNOSTIC FINDINGS:   EXAM: CT LUMBAR SPINE WITHOUT CONTRAST IMPRESSION: 1. Transitional anatomy. Adhering to convention from the prior lumbar spine MRI, the last fully formed disc space is labeled L5-S1. 2. Unchanged chronic compression fractures of L4 and L5. No new fracture of the lumbar spine. 3. Unchanged lumbar spondylosis notable for moderate narrowing of the right lateral recess at L1-2 and L2-3, as well as mild spinal canal stenosis at L2-3 and L3-4.   COGNITION: Overall cognitive status: History of cognitive impairments - at baseline   SENSATION: WFL  COORDINATION: Decreased speed of movement. Decreased ability to follow instructions with MMT   EDEMA:  Mild BLE distal edema   MUSCLE TONE: WFL  MUSCLE LENGTH: Generalized tightness from sedentary   DTRs:  Not assessed   POSTURE: rounded shoulders, forward head, increased lumbar lordosis, and increased thoracic kyphosis  LOWER EXTREMITY ROM:     Unable formally assess due to    LOWER EXTREMITY MMT:    MMT Right Eval Left Eval  Difficult to assess due to cognitive impairments, grossly 3+/5 to 4/5 proximal to distal with functional movement    BED MOBILITY:  To be assessed at  later visit.   TRANSFERS: Assistive device utilized: Environmental consultant - 2 wheeled  Sit to stand: Mod A Stand to sit: Mod A Chair to chair: Mod A Floor:  unable to complete without total A per care giver   RAMP:  Level of Assistance: Mod A Assistive device utilized: Environmental consultant - 2 wheeled Ramp Comments: caregiver reports use of RW to step down in to home entry   CURB:  Level of Assistance:  not assessed at eval  Assistive device utilized:  rails or RW  Curb Comments: care giver reports use of RW   STAIRS: Level of Assistance:  not assessed at [employer] Technique: Step to Pattern with Bilateral Rails Number of Stairs:   Height of Stairs:   Comments: no use of stairs at home for >1.5 years   GAIT: Gait pattern: decreased step length- Right, decreased stance time- Left, decreased hip/knee flexion- Right, knee flexed in stance- Right, knee flexed in stance- Left, and poor foot clearance- Right Distance walked: 20 Assistive device utilized: Walker - 2 wheeled Level of assistance: Min A and Mod A Comments: R foot drag with TUG. Increased flexed knee with fatigue.   FUNCTIONAL TESTS:  5 times sit to stand: 54 Timed up and go (TUG): 2:24 with min-mod assost  10 meter walk test: TBD   PATIENT SURVEYS:  PsFS: 4.5.   Getting into and out of bed: 0  Standing erect with walking: 5 Walking to bathroom: 4                                                                                                                               TREATMENT DATE:   10 Meter Walk Test: Patient instructed to walk 10 meters (32.8 ft) as quickly and as  safely as possible at their normal speed x2 and at a fast speed x2. Time measured from 2 meter mark to 8 meter mark to accommodate ramp-up and ramp-down.  Average Normal speed: 2:08.74 sec ( 0.77m/s).     Stand pivot transfers to and from transport char, arm chair and mat table. Performed with consistent Mod assist and cues for step pattern to improve  safety and step length   Bed mobility into and out of supine with mod-max assist. Supervision assist and cues for technique for improved sequencing in log roll.   Supine therex: Bridge 2x 5  Hip abduction x 8  SLR x 8   Sit<>supine with mod-max assist from PT due to poor motor planning and posterior bias.   Sit<>stand from elevated mat table x 5 with min-mod assist from PT for safety.   Stand pivot transfer to return to [facility] with mod assist and moderate cues for gait pattern in turn as listed above.   PATIENT EDUCATION: Education details: POC. HEP to improve generalized strength and activity tolerance  Person educated: Patient and Caregiver   Education method: Medical illustrator Education comprehension: verbalized understanding  HOME EXERCISE PROGRAM: Access Code: [email or web address] URL: [email or web address] Date: 11/16/2023 Prepared by: Grier Rocher  Exercises - Seated Long Arc Quad  - 1 x daily - 7 x weekly - 3 sets - 10 reps - Seated March  - 1 x daily - 7 x weekly - 3 sets - 10 reps - 2 hold - Seated Hip Abduction  - 1 x daily - 7 x weekly - 3 sets - 10 reps - 2 hold - Supine Heel Slide  - 1 x daily - 7 x weekly - 3 sets - 10 reps - 3 hold - Supine Hip Abduction AROM  - 1 x daily - 7 x weekly - 3 sets - 10 reps - 3 hold - Supine Straight Leg Raises  - 1 x daily - 7 x weekly - 3 sets - 10 reps - 3 hold  GOALS: Goals reviewed with patient? Yes   SHORT TERM GOALS: Target date: 12/15/2023    Patient will be independent in home exercise program to improve strength/mobility for better functional independence with ADLs. Baseline: provided on 11/16/23 Goal status: INITIAL   LONG TERM GOALS: Target date: 02/09/2024    Patient will increase PSFS score to equal to or greater than and average of 2 points   to demonstrate statistically significant improvement in mobility and quality of life.  Baseline: to be completed  Goal status: INITIAL  2.  Patient (> 56  years old) will complete five times sit to stand test in < 30 seconds indicating an increased LE strength and improved balance. Baseline: 54 sec with UE support and RW  Goal status: INITIAL    3.  Patient will increase 10 meter walk test by 0.49m/s as to improve gait speed for better community ambulation and to reduce fall risk. Baseline: to be completed  Goal status: INITIAL  4.  Patient will reduce timed up and go to <60 sec seconds to reduce fall risk and demonstrate improved transfer/gait ability. Baseline: 2:24 min with RW and min-mod assist  Goal status: INITIAL     ASSESSMENT:  CLINICAL IMPRESSION: Patient is a 89 y.o. female who was seen today for physical therapy  treatment for debility secondary to strength and sensory changes from degeneration of lumbar discs, LLE pain, R hip pain and difficulty walking.  Pt completed , 128sec with RW  and min-mod assist and moderate cues for improved step height/length on the RLE. Supine HEP reviewed. Limited ROM in bridge; care giver reports that this is very hard and limited at home.  Pt will benefit from skilled PT to address strength and balance impairments, to improve safety with bed mobility, transfers, gait and allow improvement on overall QoL and reduce care giver burden.   OBJECTIVE IMPAIRMENTS: Abnormal gait, decreased activity tolerance, decreased balance, decreased cognition, decreased coordination, decreased endurance, decreased knowledge of condition, decreased mobility, difficulty walking, decreased ROM, decreased strength, decreased safety awareness, hypomobility, impaired flexibility, impaired vision/preception, improper body mechanics, postural dysfunction, and pain.   ACTIVITY LIMITATIONS: carrying, lifting, bending, standing, squatting, stairs, transfers, bed mobility, continence, bathing, toileting, dressing, hygiene/grooming, and locomotion level  PARTICIPATION LIMITATIONS: cleaning, laundry, shopping, and community  activity  PERSONAL FACTORS: Age, Behavior pattern, Fitness, and 3+ comorbidities: HTN, L femur fx, dimentia  are also affecting patient's functional outcome.   REHAB POTENTIAL: Good  CLINICAL DECISION MAKING: Evolving/moderate complexity  EVALUATION COMPLEXITY: Moderate  PLAN:  PT FREQUENCY: 1-2x/week  PT DURATION: 12 weeks  PLANNED INTERVENTIONS: 97164- PT Re-evaluation, 97110-Therapeutic exercises, 97530- Therapeutic activity, 97112- Neuromuscular re-education, 97535- Self Care, 54098- Manual therapy, [phone]- Gait training, Balance training, Stair training, Visual/preceptual remediation/compensation, Cognitive remediation, DME instructions, Wheelchair mobility training, Cryotherapy, and Moist heat  PLAN FOR NEXT SESSION:   Activity tolerance. Nustep. Transfer training. Posture and gait.    Golden Pop, PT 11/22/2023, 11:53 AM

## 2023-11-24 ENCOUNTER — Encounter: Payer: Medicare Other | Admitting: Physical Therapy

## 2023-11-28 ENCOUNTER — Ambulatory Visit: Payer: Medicare Other | Admitting: Physical Therapy

## 2023-11-28 DIAGNOSIS — R269 Unspecified abnormalities of gait and mobility: Secondary | ICD-10-CM

## 2023-11-28 DIAGNOSIS — R262 Difficulty in walking, not elsewhere classified: Secondary | ICD-10-CM

## 2023-11-28 DIAGNOSIS — M6281 Muscle weakness (generalized): Secondary | ICD-10-CM

## 2023-11-28 DIAGNOSIS — R5381 Other malaise: Secondary | ICD-10-CM

## 2023-11-28 NOTE — Therapy (Signed)
OUTPATIENT PHYSICAL THERAPY NEURO TREATMENT   Patient Name: Kerri [name] MRN: [id number] DOB:14-Jul-1931, 89 y.o., female Today's Date: 11/28/2023   PCP: Wilford Corner, PA-C  REFERRING PROVIDER: Wilford Corner, PA-C   END OF SESSION:   PT End of Session - 11/28/23 1324     Visit Number 3    Number of Visits 24    Date for PT Re-Evaluation 02/08/24    PT Start Time 1320    PT Stop Time 1400    PT Time Calculation (min) 40 min    Equipment Utilized During Treatment Gait belt    Activity Tolerance Patient tolerated treatment well;No increased pain    Behavior During Therapy WFL for tasks assessed/performed              Past Medical History:  Diagnosis Date   Arthritis    Past Surgical History:  Procedure Laterality Date   INTRAMEDULLARY (IM) NAIL INTERTROCHANTERIC Left 09/06/2022   Procedure: INTRAMEDULLARY (IM) NAIL INTERTROCHANTERIC;  Surgeon: Signa Kell, MD;  Location: [facility];  Service: Orthopedics;  Laterality: Left;   PACEMAKER LEADLESS INSERTION N/A 01/07/2021   Procedure: PACEMAKER LEADLESS INSERTION;  Surgeon: Marcina Millard, MD;  Location: [facility];  Service: Cardiovascular;  Laterality: N/A;   PPM GENERATOR REMOVAL N/A 01/07/2021   Procedure: PPM GENERATOR REMOVAL;  Surgeon: Marcina Millard, MD;  Location: [facility];  Service: Cardiovascular;  Laterality: N/A;   Patient Active Problem List   Diagnosis Date Noted   Acute postoperative anemia due to expected blood loss 09/07/2022   Displaced intertrochanteric fracture of left femur, initial encounter for closed fracture (HCC) 09/05/2022   Orthostatic hypotension 07/12/2022   Diarrhea 07/10/2022   COVID-19 virus infection 07/08/2022   Electrolyte abnormality 07/08/2022   Malnutrition of moderate degree 07/08/2022   Acute delirium 07/08/2022   History of recurrent UTI (urinary tract infection) 01/30/2022   Constipation    Elevated brain natriuretic  peptide (BNP) level 10/15/2021   Generalized weakness 10/15/2021   Chronic kidney disease, stage 3b (HCC) 10/15/2021   Normal pressure hydrocephalus (HCC) 10/15/2021   Dementia without behavioral disturbance (HCC) 10/15/2021   Hypothyroidism 10/15/2021   Essential hypertension 10/15/2021   Overactive bladder 10/15/2021   Moderate mitral regurgitation 01/13/2021   Status post placement of cardiac pacemaker 01/13/2021   CHB (complete heart block) (HCC) 01/07/2021   Chronic systolic CHF (congestive heart failure) (HCC) 12/17/2020   Postmenopausal osteoporosis 08/26/2016   Mixed Alzheimer's and vascular dementia (HCC) 06/13/2014    ONSET DATE: getting worse over the last year   REFERRING DIAG:  Diagnosis  M51.369 (ICD-10-CM) - Degeneration of lumbar intervertebral disc    THERAPY DIAG:  Debility  Muscle weakness (generalized)  Difficulty in walking, not elsewhere classified  Abnormality of gait and mobility   Rationale for Evaluation and Treatment: Rehabilitation  SUBJECTIVE:  SUBJECTIVE STATEMENT: Pt reports feeling "OK," not wonderful but "OK." Denies any falls/stumbles at home. No medical changes since last visit. Reports not doing HEP this weekend, but plan to return to it this week.   From Eval:  Hx given from care giver; [name] .  Hx of L femur fx roughly a year and half ago. Hx of lumbar stenosis and BLE weakness. States that they want PT to address pain and weakness to allow continued mobility as tolerated. Care giver reports that she well complain of pain/numbness with mobility, but more willing to attemept to mobilize if "its something she wants"  Will self propel in [employer] at home  Pt accompanied by:  care giver   PERTINENT HISTORY:   From recent Neurology visit from MD.  "1. Mixed  dementia (Alzheimer's disease + vascular dementia) with significant ventriculomegaly (with negative lumbar drain trial in 2015) in a patient with history of sepsis secondary to UTI treated with trimethoprim January 2023. Medication appears to help with UTI and improved mental status - reports good control with current medication - Continue Donepezil 10 mg by mouth once a day  - Continue Memantine 10 mg by mouth two times a day   - Continue keeping patient involved in daily activities, such as reading out loud, helping prepare meals, picking out clothes.  - Recommend patient does not live alone. Patient should have 24/7 supervision.   2. Right leg pain with inability to pick up feet (right>left - likely from ventriculomegaly + peripheral neuropathy) patient with history of lumbar disc disease with stenosis - reports improvement during recent trial of prednisone. Suspicious for lumbar Disc Disease in addition to decondition Reviewed PCP note: Ongoing leg pain with intermittent weakness. On exam she does have some swelling around the right knee. Unsure if she could have some severe arthritis catching as she walks. Will obtain x-ray of the right knee today. Trial of prednisone 20 mg once a day for 5 days. We did discuss that she has some degenerative lumbar disc disease with stenosis and likely playing a role in right leg weakness. She has a pacemaker and we will defer MRI of the lumbar spine. Keep follow-up with neurology and if they feel it is necessary they could possibly pursue nerve conduction study. Inform caretaker to help patient and stay by her side when walking.   - Patient says that her legs feel "heavy" and that she has difficulty initiating the first step when walking - consistent with magnetic gait in patient with hydrocephalus and brain atrophy - Previous (-) Lumbar Puncture trial at [facility] (01/09/14)."   PAIN:  Are you having pain? No  PRECAUTIONS: Fall  RED FLAGS: None   WEIGHT  BEARING RESTRICTIONS: No  FALLS: Has patient fallen in last 6 months? Yes. Number of falls 2-3 controlled falls with assist to floor.    LIVING ENVIRONMENT: Lives with:  caregiver 7 days a week. Alone for an hour or 2 a day, but only when alseep.   Lives in: House/apartment Stairs: Yes: Internal: 1 but ramp in place steps; none and External: 1 steps; none Has following equipment at home: Walker - 2 wheeled  PLOF: Requires assistive device for independence, Needs assistance with ADLs, Needs assistance with homemaking, Needs assistance with gait, and Needs assistance with transfers  PATIENT GOALS: get stronger so she can "go and do"   OBJECTIVE:  Note: Objective measures were completed at Evaluation unless otherwise noted.  DIAGNOSTIC FINDINGS:   EXAM: CT LUMBAR SPINE WITHOUT  CONTRAST IMPRESSION: 1. Transitional anatomy. Adhering to convention from the prior lumbar spine MRI, the last fully formed disc space is labeled L5-S1. 2. Unchanged chronic compression fractures of L4 and L5. No new fracture of the lumbar spine. 3. Unchanged lumbar spondylosis notable for moderate narrowing of the right lateral recess at L1-2 and L2-3, as well as mild spinal canal stenosis at L2-3 and L3-4.   COGNITION: Overall cognitive status: History of cognitive impairments - at baseline   SENSATION: WFL  COORDINATION: Decreased speed of movement. Decreased ability to follow instructions with MMT   EDEMA:  Mild BLE distal edema   MUSCLE TONE: WFL  MUSCLE LENGTH: Generalized tightness from sedentary   DTRs:  Not assessed   POSTURE: rounded shoulders, forward head, increased lumbar lordosis, and increased thoracic kyphosis  LOWER EXTREMITY ROM:     Unable formally assess due to    LOWER EXTREMITY MMT:    MMT Right Eval Left Eval  Difficult to assess due to cognitive impairments, grossly 3+/5 to 4/5 proximal to distal with functional movement    BED MOBILITY:  To be assessed at  later visit.   TRANSFERS: Assistive device utilized: Environmental consultant - 2 wheeled  Sit to stand: Mod A Stand to sit: Mod A Chair to chair: Mod A Floor:  unable to complete without total A per care giver   RAMP:  Level of Assistance: Mod A Assistive device utilized: Environmental consultant - 2 wheeled Ramp Comments: caregiver reports use of RW to step down in to home entry   CURB:  Level of Assistance:  not assessed at eval  Assistive device utilized:  rails or RW  Curb Comments: care giver reports use of RW   STAIRS: Level of Assistance:  not assessed at [employer] Technique: Step to Pattern with Bilateral Rails Number of Stairs:   Height of Stairs:   Comments: no use of stairs at home for >1.5 years   GAIT: Gait pattern: decreased step length- Right, decreased stance time- Left, decreased hip/knee flexion- Right, knee flexed in stance- Right, knee flexed in stance- Left, and poor foot clearance- Right Distance walked: 20 Assistive device utilized: Walker - 2 wheeled Level of assistance: Min A and Mod A Comments: R foot drag with TUG. Increased flexed knee with fatigue.   FUNCTIONAL TESTS:  5 times sit to stand: 54 Timed up and go (TUG): 2:24 with min-mod assost  10 meter walk test: TBD   PATIENT SURVEYS:  PsFS: 4.5.   Getting into and out of bed: 0  Standing erect with walking: 5 Walking to bathroom: 4                                                                                                                               TREATMENT DATE:   11/28/23  Sit>stand transport chair>RW with mod A and pt very slow to rise with strong R lateral trunk lean bias, slow hip/knee extension into  standing.  Gait training ~65ft using RW with skilled min/mod assist for balance. Pt demonstrating the following gait deviations with therapist providing the described cuing and facilitation for improvement:  - strong R lateral trunk lean bias - R foot drag during swing  - very slow gait speed  -  small step lengths bilaterally (R more impaired than L) with primarily step-to pattern   Repeataed sit>stands from EOM>RW with blaze pod on L upper side of mirror to promote full upright posture with L lateral trunk lean - requires min A to come to standing - x10reps with only 10sec rest break between each rep.  Pt benefits from the visual target to increase powering up to stand.  Seated EOM throughout session, pt has persistent R posterior trunk lean/LOB requiring constant SBA/CGA to min A to maintain upright with verbal cuing to attend to LOB. Pt with impaired dual-tasking to attend to the lean while performing another task (such as blowing nose or participating in conversation).  Standing with B UE support on RW alternating foot taps to [employer] set to alternating sequence providing a structured environment to follow a specific pattern and improve coordination and movement sequences - started with heavy min A to maintain upright balance progressed to max A then to total A to return to sitting safely back on mat table due to gradual worsening crouched posture with pt unable to recover to upright stance. Performed 14 reps. Pt benefits from this dual-task challenge of attending to the lights and therapist's cues during the task, but difficulty prioritizing therapist's cues to maintain balance.   SpO2 98% and HR 62bpm during seated rest break   Gait training ~35ft back to transport chair using RW with skilled min/mod A as described above and continued verbal cuing for improved upright, midline posture, and increased step lengths bilaterally (R>L). At end of gait, pt tries to rotate hips and sit down prior to stepping back to the seat fully, requiring +2 to bring chair up behind her for safety.   PATIENT EDUCATION: Education details: POC. HEP to improve generalized strength and activity tolerance  Person educated: Patient and Caregiver   Education method: Medical illustrator Education  comprehension: verbalized understanding  HOME EXERCISE PROGRAM: Access Code: [email or web address] URL: [email or web address] Date: 11/16/2023 Prepared by: Grier Rocher  Exercises - Seated Long Arc Quad  - 1 x daily - 7 x weekly - 3 sets - 10 reps - Seated March  - 1 x daily - 7 x weekly - 3 sets - 10 reps - 2 hold - Seated Hip Abduction  - 1 x daily - 7 x weekly - 3 sets - 10 reps - 2 hold - Supine Heel Slide  - 1 x daily - 7 x weekly - 3 sets - 10 reps - 3 hold - Supine Hip Abduction AROM  - 1 x daily - 7 x weekly - 3 sets - 10 reps - 3 hold - Supine Straight Leg Raises  - 1 x daily - 7 x weekly - 3 sets - 10 reps - 3 hold  GOALS: Goals reviewed with patient? Yes   SHORT TERM GOALS: Target date: 12/15/2023    Patient will be independent in home exercise program to improve strength/mobility for better functional independence with ADLs. Baseline: provided on 11/16/23 Goal status: INITIAL   LONG TERM GOALS: Target date: 02/09/2024    Patient will increase PSFS score to equal to or greater than and average of 2 points   to demonstrate  statistically significant improvement in mobility and quality of life.  Baseline: to be completed  Goal status: INITIAL  2.  Patient (> 24 years old) will complete five times sit to stand test in < 30 seconds indicating an increased LE strength and improved balance. Baseline: 54 sec with UE support and RW  Goal status: INITIAL    3.  Patient will increase 10 meter walk test by 0.72m/s as to improve gait speed for better community ambulation and to reduce fall risk. Baseline: to be completed  Goal status: INITIAL  4.  Patient will reduce timed up and go to <60 sec seconds to reduce fall risk and demonstrate improved transfer/gait ability. Baseline: 2:24 min with RW and min-mod assist  Goal status: INITIAL     ASSESSMENT:  CLINICAL IMPRESSION:  Patient is a 89 y.o. female who was seen today for physical therapy treatment for debility  secondary to strength and sensory changes from degeneration of lumbar discs, LLE pain, R hip pain and difficulty walking. Patient demonstrates significant functional strength deficits requiring mod A for transfers and gait training as well as difficulty with dual-task challenges. Pt with persistent R lateral trunk lean/LOB sitting EOM with decreased awareness of it and decreased initiation to correct balance. Pt with overall R lateral trunk lean during sit<>stand transfers and gait training training as well. Pt will benefit from continued skilled PT to address strength and balance impairments, to improve safety with bed mobility, transfers, gait and allow improvement on overall QoL and reduce care giver burden.   OBJECTIVE IMPAIRMENTS: Abnormal gait, decreased activity tolerance, decreased balance, decreased cognition, decreased coordination, decreased endurance, decreased knowledge of condition, decreased mobility, difficulty walking, decreased ROM, decreased strength, decreased safety awareness, hypomobility, impaired flexibility, impaired vision/preception, improper body mechanics, postural dysfunction, and pain.   ACTIVITY LIMITATIONS: carrying, lifting, bending, standing, squatting, stairs, transfers, bed mobility, continence, bathing, toileting, dressing, hygiene/grooming, and locomotion level  PARTICIPATION LIMITATIONS: cleaning, laundry, shopping, and community activity  PERSONAL FACTORS: Age, Behavior pattern, Fitness, and 3+ comorbidities: HTN, L femur fx, dimentia  are also affecting patient's functional outcome.   REHAB POTENTIAL: Good  CLINICAL DECISION MAKING: Evolving/moderate complexity  EVALUATION COMPLEXITY: Moderate  PLAN:  PT FREQUENCY: 1-2x/week  PT DURATION: 12 weeks  PLANNED INTERVENTIONS: [id number]- PT Re-evaluation, 97110-Therapeutic exercises, 97530- Therapeutic activity, 97112- Neuromuscular re-education, 97535- Self Care, [id number]- Manual therapy, [phone]- Gait training,  Balance training, Stair training, Visual/preceptual remediation/compensation, Cognitive remediation, DME instructions, Wheelchair mobility training, Cryotherapy, and Moist heat  PLAN FOR NEXT SESSION:   Activity tolerance, standing tolerance. Nustep. Transfer training. Posture and gait training - improving midline orientation due to R lateral lean bias.     Ginny Forth, PT, DPT, NCS, CSRS Physical Therapist - Ages  [facility]  3:36 PM 11/28/23

## 2023-11-30 ENCOUNTER — Ambulatory Visit: Payer: Medicare Other | Admitting: Physical Therapy

## 2023-12-01 ENCOUNTER — Encounter: Payer: Medicare Other | Admitting: Physical Therapy

## 2023-12-05 ENCOUNTER — Ambulatory Visit: Payer: Medicare Other | Admitting: Physical Therapy

## 2023-12-05 DIAGNOSIS — R5381 Other malaise: Secondary | ICD-10-CM | POA: Diagnosis not present

## 2023-12-05 DIAGNOSIS — R262 Difficulty in walking, not elsewhere classified: Secondary | ICD-10-CM

## 2023-12-05 DIAGNOSIS — M6281 Muscle weakness (generalized): Secondary | ICD-10-CM

## 2023-12-05 DIAGNOSIS — R269 Unspecified abnormalities of gait and mobility: Secondary | ICD-10-CM

## 2023-12-05 NOTE — Therapy (Signed)
 OUTPATIENT PHYSICAL THERAPY NEURO TREATMENT   Patient Name: Kerri [name] MRN: [id number] DOB:05/19/1931, 89 y.o., female Today's Date: 12/05/2023   PCP: Wilford Corner, PA-C  REFERRING PROVIDER: Wilford Corner, PA-C   END OF SESSION:   PT End of Session - 12/05/23 1452     Visit Number 4    Number of Visits 24    Date for PT Re-Evaluation 02/08/24    PT Start Time 1452    PT Stop Time 1532    PT Time Calculation (min) 40 min    Equipment Utilized During Treatment Gait belt    Activity Tolerance Patient tolerated treatment well;No increased pain    Behavior During Therapy WFL for tasks assessed/performed               Past Medical History:  Diagnosis Date   Arthritis    Past Surgical History:  Procedure Laterality Date   INTRAMEDULLARY (IM) NAIL INTERTROCHANTERIC Left 09/06/2022   Procedure: INTRAMEDULLARY (IM) NAIL INTERTROCHANTERIC;  Surgeon: Signa Kell, MD;  Location: [facility];  Service: Orthopedics;  Laterality: Left;   PACEMAKER LEADLESS INSERTION N/A 01/07/2021   Procedure: PACEMAKER LEADLESS INSERTION;  Surgeon: Marcina Millard, MD;  Location: [facility];  Service: Cardiovascular;  Laterality: N/A;   PPM GENERATOR REMOVAL N/A 01/07/2021   Procedure: PPM GENERATOR REMOVAL;  Surgeon: Marcina Millard, MD;  Location: [facility];  Service: Cardiovascular;  Laterality: N/A;   Patient Active Problem List   Diagnosis Date Noted   Acute postoperative anemia due to expected blood loss 09/07/2022   Displaced intertrochanteric fracture of left femur, initial encounter for closed fracture (HCC) 09/05/2022   Orthostatic hypotension 07/12/2022   Diarrhea 07/10/2022   COVID-19 virus infection 07/08/2022   Electrolyte abnormality 07/08/2022   Malnutrition of moderate degree 07/08/2022   Acute delirium 07/08/2022   History of recurrent UTI (urinary tract infection) 01/30/2022   Constipation    Elevated brain natriuretic  peptide (BNP) level 10/15/2021   Generalized weakness 10/15/2021   Chronic kidney disease, stage 3b (HCC) 10/15/2021   Normal pressure hydrocephalus (HCC) 10/15/2021   Dementia without behavioral disturbance (HCC) 10/15/2021   Hypothyroidism 10/15/2021   Essential hypertension 10/15/2021   Overactive bladder 10/15/2021   Moderate mitral regurgitation 01/13/2021   Status post placement of cardiac pacemaker 01/13/2021   CHB (complete heart block) (HCC) 01/07/2021   Chronic systolic CHF (congestive heart failure) (HCC) 12/17/2020   Postmenopausal osteoporosis 08/26/2016   Mixed Alzheimer's and vascular dementia (HCC) 06/13/2014    ONSET DATE: getting worse over the last year   REFERRING DIAG:  Diagnosis  M51.369 (ICD-10-CM) - Degeneration of lumbar intervertebral disc    THERAPY DIAG:   Debility  Muscle weakness (generalized)  Difficulty in walking, not elsewhere classified  Abnormality of gait and mobility   Rationale for Evaluation and Treatment: Rehabilitation  SUBJECTIVE:  SUBJECTIVE STATEMENT:  [name], caregiver, requests to incorporate pt's L hand into her therapy, due to pt having decreased use of it and decreased attention to it. Pt overall appears to have increased energy today and states she wants to work hard while she is here. Pt also stating she wants to work on her walking.   From Eval:  Hx given from care giver; [name] .  Hx of L femur fx roughly a year and half ago. Hx of lumbar stenosis and BLE weakness. States that they want PT to address pain and weakness to allow continued mobility as tolerated. Care giver reports that she well complain of pain/numbness with mobility, but more willing to attemept to mobilize if "its something she wants"  Will self propel in [facility] at home  Pt  accompanied by:  care giver   PERTINENT HISTORY:   From recent Neurology visit from MD.  "1. Mixed dementia (Alzheimer's disease + vascular dementia) with significant ventriculomegaly (with negative lumbar drain trial in 2015) in a patient with history of sepsis secondary to UTI treated with trimethoprim January 2023. Medication appears to help with UTI and improved mental status - reports good control with current medication - Continue Donepezil 10 mg by mouth once a day  - Continue Memantine 10 mg by mouth two times a day   - Continue keeping patient involved in daily activities, such as reading out loud, helping prepare meals, picking out clothes.  - Recommend patient does not live alone. Patient should have 24/7 supervision.   2. Right leg pain with inability to pick up feet (right>left - likely from ventriculomegaly + peripheral neuropathy) patient with history of lumbar disc disease with stenosis - reports improvement during recent trial of prednisone. Suspicious for lumbar Disc Disease in addition to decondition Reviewed PCP note: Ongoing leg pain with intermittent weakness. On exam she does have some swelling around the right knee. Unsure if she could have some severe arthritis catching as she walks. Will obtain x-ray of the right knee today. Trial of prednisone 20 mg once a day for 5 days. We did discuss that she has some degenerative lumbar disc disease with stenosis and likely playing a role in right leg weakness. She has a pacemaker and we will defer MRI of the lumbar spine. Keep follow-up with neurology and if they feel it is necessary they could possibly pursue nerve conduction study. Inform caretaker to help patient and stay by her side when walking.   - Patient says that her legs feel "heavy" and that she has difficulty initiating the first step when walking - consistent with magnetic gait in patient with hydrocephalus and brain atrophy - Previous (-) Lumbar Puncture trial at [facility]  (01/09/14)."   PAIN:  Are you having pain? No  PRECAUTIONS: Fall  RED FLAGS: None   WEIGHT BEARING RESTRICTIONS: No  FALLS: Has patient fallen in last 6 months? Yes. Number of falls 2-3 controlled falls with assist to floor.    LIVING ENVIRONMENT: Lives with:  caregiver 7 days a week. Alone for an hour or 2 a day, but only when alseep.   Lives in: House/apartment Stairs: Yes: Internal: 1 but ramp in place steps; none and External: 1 steps; none Has following equipment at home: Walker - 2 wheeled  PLOF: Requires assistive device for independence, Needs assistance with ADLs, Needs assistance with homemaking, Needs assistance with gait, and Needs assistance with transfers  PATIENT GOALS: get stronger so she can "go and do"   OBJECTIVE:  Note: Objective measures were completed at Evaluation unless otherwise noted.  DIAGNOSTIC FINDINGS:   EXAM: CT LUMBAR SPINE WITHOUT CONTRAST IMPRESSION: 1. Transitional anatomy. Adhering to convention from the prior lumbar spine MRI, the last fully formed disc space is labeled L5-S1. 2. Unchanged chronic compression fractures of L4 and L5. No new fracture of the lumbar spine. 3. Unchanged lumbar spondylosis notable for moderate narrowing of the right lateral recess at L1-2 and L2-3, as well as mild spinal canal stenosis at L2-3 and L3-4.   COGNITION: Overall cognitive status: History of cognitive impairments - at baseline   SENSATION: WFL  COORDINATION: Decreased speed of movement. Decreased ability to follow instructions with MMT   EDEMA:  Mild BLE distal edema   MUSCLE TONE: WFL  MUSCLE LENGTH: Generalized tightness from sedentary   DTRs:  Not assessed   POSTURE: rounded shoulders, forward head, increased lumbar lordosis, and increased thoracic kyphosis  LOWER EXTREMITY ROM:     Unable formally assess due to    LOWER EXTREMITY MMT:    MMT Right Eval Left Eval  Difficult to assess due to cognitive impairments,  grossly 3+/5 to 4/5 proximal to distal with functional movement    BED MOBILITY:  To be assessed at later visit.   TRANSFERS: Assistive device utilized: Environmental consultant - 2 wheeled  Sit to stand: Mod A Stand to sit: Mod A Chair to chair: Mod A Floor:  unable to complete without total A per care giver   RAMP:  Level of Assistance: Mod A Assistive device utilized: Environmental consultant - 2 wheeled Ramp Comments: caregiver reports use of RW to step down in to home entry   CURB:  Level of Assistance:  not assessed at eval  Assistive device utilized:  rails or RW  Curb Comments: care giver reports use of RW   STAIRS: Level of Assistance:  not assessed at [employer] Technique: Step to Pattern with Bilateral Rails Number of Stairs:   Height of Stairs:   Comments: no use of stairs at home for >1.5 years   GAIT: Gait pattern: decreased step length- Right, decreased stance time- Left, decreased hip/knee flexion- Right, knee flexed in stance- Right, knee flexed in stance- Left, and poor foot clearance- Right Distance walked: 20 Assistive device utilized: Walker - 2 wheeled Level of assistance: Min A and Mod A Comments: R foot drag with TUG. Increased flexed knee with fatigue.   FUNCTIONAL TESTS:  5 times sit to stand: 54 Timed up and go (TUG): 2:24 with min-mod assost  10 meter walk test: TBD   PATIENT SURVEYS:  PsFS: 4.5.   Getting into and out of bed: 0  Standing erect with walking: 5 Walking to bathroom: 4                                                                                                                               TREATMENT DATE:   12/05/23  Sit>stand transport chair>RW  with min A and pt slow to rise with mild R lateral trunk lean bias, slow hip/knee extension into standing, but improved from last visit. Continues to lack adequate anterior weight shift when initiating coming to stand.  Gait training ~51ft using RW with skilled min/light mod assist for balance. Pt  demonstrating the following gait deviations with therapist providing the described cuing and facilitation for improvement:  - mild R lateral trunk lean bias - R foot drag during swing, verbal cuing for increased step length to achieve reciprocal stepping pattern - very slow gait speed  - small step lengths bilaterally (R more impaired than L) with pt achieving more consistent reciprocal stepping today - continues to initiate sitting before turning fully\  Repeated sit>stands from elevated EOM>RW with basketball goal in front of pt - having to grab ball with L hand to promote L anterior trunk lean and then stand to "dunk" it in the basketball goal - pt still relies heavily on back of B LEs against mat to maintain balance and come to standing, repeated cuing to scoot hips forward and improve anterior weight shift when coming to stand. Min A for lifting/lowering and balance.  Gait training 32ft + ~33ft (seated break between) using RW with skilled progressively heavier min assist for balance with +2 w/c follow. Pt demonstrating the above gait deviations still and with fatigue has more gradual overall crouched posture throughout with excessive trunk/hip/knee flexion. - continues to have B LE foot drag during swing (R more impaired than L)  Standing tolerance for 3 min with dual-cognitive task using [employer] Random setting to challenge cognitive processing using 2 colors having to tap R hand to red and L hand to blue pods while using opposite UE support on RW - requires min A for balance and max cuing to ensure tapping correct hand to identified color.   Seated EOM throughout session, pt has minor R posterior trunk lean, requiring close supervision for safety, but less prominent lean compared to last session.  Pt overall appears to have improved endurance today with less labored breathing noted with activity.  PATIENT EDUCATION: Education details: POC. HEP to improve generalized strength and  activity tolerance  Person educated: Patient and Caregiver   Education method: Medical illustrator Education comprehension: verbalized understanding  HOME EXERCISE PROGRAM: Access Code: [email or web address] URL: [email or web address] Date: 11/16/2023 Prepared by: Grier Rocher  Exercises - Seated Long Arc Quad  - 1 x daily - 7 x weekly - 3 sets - 10 reps - Seated March  - 1 x daily - 7 x weekly - 3 sets - 10 reps - 2 hold - Seated Hip Abduction  - 1 x daily - 7 x weekly - 3 sets - 10 reps - 2 hold - Supine Heel Slide  - 1 x daily - 7 x weekly - 3 sets - 10 reps - 3 hold - Supine Hip Abduction AROM  - 1 x daily - 7 x weekly - 3 sets - 10 reps - 3 hold - Supine Straight Leg Raises  - 1 x daily - 7 x weekly - 3 sets - 10 reps - 3 hold  GOALS: Goals reviewed with patient? Yes   SHORT TERM GOALS: Target date: 12/15/2023    Patient will be independent in home exercise program to improve strength/mobility for better functional independence with ADLs. Baseline: provided on 11/16/23 Goal status: INITIAL   LONG TERM GOALS: Target date: 02/09/2024    Patient will increase PSFS score to  equal to or greater than and average of 2 points   to demonstrate statistically significant improvement in mobility and quality of life.  Baseline: initial eval: 4.5 Goal status: INITIAL  2.  Patient (> 15 years old) will complete five times sit to stand test in < 30 seconds indicating an increased LE strength and improved balance. Baseline: 54 sec with UE support and RW  Goal status: INITIAL    3.  Patient will increase 10 meter walk test by 0.32m/s as to improve gait speed for better community ambulation and to reduce fall risk. Baseline: 12/05/23: pt currently requires min physical assistance and is now able to walk far enough to complete this test at next visit Goal status: INITIAL  4.  Patient will reduce timed up and go to <60 sec seconds to reduce fall risk and demonstrate improved  transfer/gait ability. Baseline: 2:24 min with RW and min-mod assist  Goal status: INITIAL     ASSESSMENT:  CLINICAL IMPRESSION:  Patient is a 89 y.o. female who was seen today for physical therapy treatment for debility secondary to strength and sensory changes from degeneration of lumbar discs, LLE pain, R hip pain and difficulty walking. Patient continues to demonstrate functional strength deficits requiring min/mod A for transfers and gait training. She also has difficulty with dual-task challenges due to cognitive decline from dementia. Pt with mild R lateral trunk lean/LOB sitting EOM today with improving awareness of it and increasing initiation to correct balance. Pt continues to also have mild R lateral trunk lean during sit<>stand transfers and gait training; however, was able to progress to participating in up to 35ft of gait training today. Pt will benefit from continued skilled PT to address strength and balance impairments, to improve safety with bed mobility, transfers, gait and allow improvement on overall QoL and reduce care giver burden.   OBJECTIVE IMPAIRMENTS: Abnormal gait, decreased activity tolerance, decreased balance, decreased cognition, decreased coordination, decreased endurance, decreased knowledge of condition, decreased mobility, difficulty walking, decreased ROM, decreased strength, decreased safety awareness, hypomobility, impaired flexibility, impaired vision/preception, improper body mechanics, postural dysfunction, and pain.   ACTIVITY LIMITATIONS: carrying, lifting, bending, standing, squatting, stairs, transfers, bed mobility, continence, bathing, toileting, dressing, hygiene/grooming, and locomotion level  PARTICIPATION LIMITATIONS: cleaning, laundry, shopping, and community activity  PERSONAL FACTORS: Age, Behavior pattern, Fitness, and 3+ comorbidities: HTN, L femur fx, dimentia  are also affecting patient's functional outcome.   REHAB POTENTIAL:  Good  CLINICAL DECISION MAKING: Evolving/moderate complexity  EVALUATION COMPLEXITY: Moderate  PLAN:  PT FREQUENCY: 1-2x/week  PT DURATION: 12 weeks  PLANNED INTERVENTIONS: [id number]- PT Re-evaluation, 97110-Therapeutic exercises, 97530- Therapeutic activity, 97112- Neuromuscular re-education, 97535- Self Care, [id number]- Manual therapy, [phone]- Gait training, Balance training, Stair training, Visual/preceptual remediation/compensation, Cognitive remediation, DME instructions, Wheelchair mobility training, Cryotherapy, and Moist heat  PLAN FOR NEXT SESSION:  - discuss OT referral vs incorporating L UE into PT interventions - 10 meter walk test - gait training using LRAD - progression to side stepping and backwards stepping (in // bars?) - sit>stand functional strengthening with promotion of L anterior weight shift - improved midline orientation due to R lean bias - incorporate L UE into activities  - dual-cognitive tasks with above interventions  Overall: Activity tolerance, standing tolerance. Transfer training (sits before turning fully).     Ginny Forth, PT, DPT, NCS, CSRS Physical Therapist - [place]  [facility]  3:39 PM 12/05/23

## 2023-12-07 ENCOUNTER — Ambulatory Visit: Payer: Medicare Other | Admitting: Physical Therapy

## 2023-12-07 DIAGNOSIS — R262 Difficulty in walking, not elsewhere classified: Secondary | ICD-10-CM

## 2023-12-07 DIAGNOSIS — M6281 Muscle weakness (generalized): Secondary | ICD-10-CM

## 2023-12-07 DIAGNOSIS — R5381 Other malaise: Secondary | ICD-10-CM | POA: Diagnosis not present

## 2023-12-07 DIAGNOSIS — R269 Unspecified abnormalities of gait and mobility: Secondary | ICD-10-CM

## 2023-12-07 NOTE — Therapy (Signed)
 OUTPATIENT PHYSICAL THERAPY NEURO TREATMENT   Patient Name: KENIESHA ADDERLY MRN: [id number] DOB:10-31-1930, 89 y.o., female Today's Date: 12/07/2023   PCP: Wilford Corner, PA-C  REFERRING PROVIDER: Wilford Corner, PA-C   END OF SESSION:   PT End of Session - 12/07/23 1311     Visit Number 5    Number of Visits 24    Date for PT Re-Evaluation 02/08/24    PT Start Time 1315    PT Stop Time 1400    PT Time Calculation (min) 45 min    Equipment Utilized During Treatment Gait belt    Activity Tolerance Patient tolerated treatment well;No increased pain    Behavior During Therapy WFL for tasks assessed/performed                Past Medical History:  Diagnosis Date   Arthritis    Past Surgical History:  Procedure Laterality Date   INTRAMEDULLARY (IM) NAIL INTERTROCHANTERIC Left 09/06/2022   Procedure: INTRAMEDULLARY (IM) NAIL INTERTROCHANTERIC;  Surgeon: Signa Kell, MD;  Location: [facility] ORS;  Service: Orthopedics;  Laterality: Left;   PACEMAKER LEADLESS INSERTION N/A 01/07/2021   Procedure: PACEMAKER LEADLESS INSERTION;  Surgeon: Marcina Millard, MD;  Location: [facility];  Service: Cardiovascular;  Laterality: N/A;   PPM GENERATOR REMOVAL N/A 01/07/2021   Procedure: PPM GENERATOR REMOVAL;  Surgeon: Marcina Millard, MD;  Location: [facility];  Service: Cardiovascular;  Laterality: N/A;   Patient Active Problem List   Diagnosis Date Noted   Acute postoperative anemia due to expected blood loss 09/07/2022   Displaced intertrochanteric fracture of left femur, initial encounter for closed fracture (HCC) 09/05/2022   Orthostatic hypotension 07/12/2022   Diarrhea 07/10/2022   COVID-19 virus infection 07/08/2022   Electrolyte abnormality 07/08/2022   Malnutrition of moderate degree 07/08/2022   Acute delirium 07/08/2022   History of recurrent UTI (urinary tract infection) 01/30/2022   Constipation    Elevated brain natriuretic  peptide (BNP) level 10/15/2021   Generalized weakness 10/15/2021   Chronic kidney disease, stage 3b (HCC) 10/15/2021   Normal pressure hydrocephalus (HCC) 10/15/2021   Dementia without behavioral disturbance (HCC) 10/15/2021   Hypothyroidism 10/15/2021   Essential hypertension 10/15/2021   Overactive bladder 10/15/2021   Moderate mitral regurgitation 01/13/2021   Status post placement of cardiac pacemaker 01/13/2021   CHB (complete heart block) (HCC) 01/07/2021   Chronic systolic CHF (congestive heart failure) (HCC) 12/17/2020   Postmenopausal osteoporosis 08/26/2016   Mixed Alzheimer's and vascular dementia (HCC) 06/13/2014    ONSET DATE: getting worse over the last year   REFERRING DIAG:  Diagnosis  M51.369 (ICD-10-CM) - Degeneration of lumbar intervertebral disc    THERAPY DIAG:   Debility  Muscle weakness (generalized)  Difficulty in walking, not elsewhere classified  Abnormality of gait and mobility   Rationale for Evaluation and Treatment: Rehabilitation  SUBJECTIVE:  SUBJECTIVE STATEMENT:  Pt reports feeling good and eager to participate in therapy session. [name], caregiver, declines any changes in pt's medical status. Pt with good energy today, but not as much as last visit. Pt reiterates her goal is to get back to walking.   From Eval:  Hx given from care giver; [name] .  Hx of L femur fx roughly a year and half ago. Hx of lumbar stenosis and BLE weakness. States that they want PT to address pain and weakness to allow continued mobility as tolerated. Care giver reports that she well complain of pain/numbness with mobility, but more willing to attemept to mobilize if "its something she wants"  Will self propel in [facility] at home  Pt accompanied by:  care giver   PERTINENT HISTORY:    From recent Neurology visit from MD.  "1. Mixed dementia (Alzheimer's disease + vascular dementia) with significant ventriculomegaly (with negative lumbar drain trial in 2015) in a patient with history of sepsis secondary to UTI treated with trimethoprim January 2023. Medication appears to help with UTI and improved mental status - reports good control with current medication - Continue Donepezil 10 mg by mouth once a day  - Continue Memantine 10 mg by mouth two times a day   - Continue keeping patient involved in daily activities, such as reading out loud, helping prepare meals, picking out clothes.  - Recommend patient does not live alone. Patient should have 24/7 supervision.   2. Right leg pain with inability to pick up feet (right>left - likely from ventriculomegaly + peripheral neuropathy) patient with history of lumbar disc disease with stenosis - reports improvement during recent trial of prednisone. Suspicious for lumbar Disc Disease in addition to decondition Reviewed PCP note: Ongoing leg pain with intermittent weakness. On exam she does have some swelling around the right knee. Unsure if she could have some severe arthritis catching as she walks. Will obtain x-ray of the right knee today. Trial of prednisone 20 mg once a day for 5 days. We did discuss that she has some degenerative lumbar disc disease with stenosis and likely playing a role in right leg weakness. She has a pacemaker and we will defer MRI of the lumbar spine. Keep follow-up with neurology and if they feel it is necessary they could possibly pursue nerve conduction study. Inform caretaker to help patient and stay by her side when walking.   - Patient says that her legs feel "heavy" and that she has difficulty initiating the first step when walking - consistent with magnetic gait in patient with hydrocephalus and brain atrophy - Previous (-) Lumbar Puncture trial at [facility] (01/09/14)."   PAIN:  Are you having pain?  No  PRECAUTIONS: Fall  RED FLAGS: None   WEIGHT BEARING RESTRICTIONS: No  FALLS: Has patient fallen in last 6 months? Yes. Number of falls 2-3 controlled falls with assist to floor.    LIVING ENVIRONMENT: Lives with:  caregiver 7 days a week. Alone for an hour or 2 a day, but only when alseep.   Lives in: House/apartment Stairs: Yes: Internal: 1 but ramp in place steps; none and External: 1 steps; none Has following equipment at home: Walker - 2 wheeled  PLOF: Requires assistive device for independence, Needs assistance with ADLs, Needs assistance with homemaking, Needs assistance with gait, and Needs assistance with transfers  PATIENT GOALS: get stronger so she can "go and do"   OBJECTIVE:  Note: Objective measures were completed at Evaluation unless otherwise noted.  DIAGNOSTIC FINDINGS:   EXAM: CT LUMBAR SPINE WITHOUT CONTRAST IMPRESSION: 1. Transitional anatomy. Adhering to convention from the prior lumbar spine MRI, the last fully formed disc space is labeled L5-S1. 2. Unchanged chronic compression fractures of L4 and L5. No new fracture of the lumbar spine. 3. Unchanged lumbar spondylosis notable for moderate narrowing of the right lateral recess at L1-2 and L2-3, as well as mild spinal canal stenosis at L2-3 and L3-4.   COGNITION: Overall cognitive status: History of cognitive impairments - at baseline   SENSATION: WFL  COORDINATION: Decreased speed of movement. Decreased ability to follow instructions with MMT   EDEMA:  Mild BLE distal edema   MUSCLE TONE: WFL  MUSCLE LENGTH: Generalized tightness from sedentary   DTRs:  Not assessed   POSTURE: rounded shoulders, forward head, increased lumbar lordosis, and increased thoracic kyphosis  LOWER EXTREMITY ROM:     Unable formally assess due to    LOWER EXTREMITY MMT:    MMT Right Eval Left Eval  Difficult to assess due to cognitive impairments, grossly 3+/5 to 4/5 proximal to distal with  functional movement    BED MOBILITY:  To be assessed at later visit.   TRANSFERS: Assistive device utilized: Environmental consultant - 2 wheeled  Sit to stand: Mod A Stand to sit: Mod A Chair to chair: Mod A Floor:  unable to complete without total A per care giver   RAMP:  Level of Assistance: Mod A Assistive device utilized: Environmental consultant - 2 wheeled Ramp Comments: caregiver reports use of RW to step down in to home entry   CURB:  Level of Assistance:  not assessed at eval  Assistive device utilized:  rails or RW  Curb Comments: care giver reports use of RW   STAIRS: Level of Assistance:  not assessed at [employer] Technique: Step to Pattern with Bilateral Rails Number of Stairs:   Height of Stairs:   Comments: no use of stairs at home for >1.5 years   GAIT: Gait pattern: decreased step length- Right, decreased stance time- Left, decreased hip/knee flexion- Right, knee flexed in stance- Right, knee flexed in stance- Left, and poor foot clearance- Right Distance walked: 20 Assistive device utilized: Walker - 2 wheeled Level of assistance: Min A and Mod A Comments: R foot drag with TUG. Increased flexed knee with fatigue.   FUNCTIONAL TESTS:  5 times sit to stand: 54 Timed up and go (TUG): 2:24 with min-mod assost  10 meter walk test: TBD   PATIENT SURVEYS:  PsFS: 4.5.   Getting into and out of bed: 0  Standing erect with walking: 5 Walking to bathroom: 4                                                                                                                               TREATMENT DATE:   12/07/23  Pt arrives to session in transport chair.  Discussed plan to request OT referral  due to pt's caregiver reporting decreased used/function of L UE/hand.  Unless otherwise stated, min A was provided and gait belt donned in order to ensure pt safety throughout.  Sit>stand transport chair>RW with min A and pt slow to rise with slow anterior weight shift followed by slow  trunk/hip/knee extension to achieve upright - cuing to push-up from seat armrest - pt demos improving ability to bring feet back underneath BOS - no significant R lateral lean noted today. Continues to improve each visit, but primarily continues to lack adequate anterior weight shift when initiating coming to stand (likely partially due to fear of falling).  Stand pivot transfer transport chair<>EOM using RW with min A and pt demoing improved ability to turn and sit towards R compared to towards L - benefits from facilitation of turning AD and facilitating weight shifting to promote stepping fully  Gait training ~24ft + 72ft using RW with skilled min/light mod assist for balance and +2 w/c follow to allow increased distance. Pt demonstrating the following gait deviations with therapist providing the described cuing and facilitation for improvement:  - very mild R lateral trunk lean bias - B LE foot drag during swing, R more impaired than L, verbal cuing for increased step length to achieve reciprocal stepping pattern *has significant hip flexor strength deficits* - slight manual facilitation for weight shifting onto stance limbs to allow increased contralateral step length - very slow gait speed  - excessive forward trunk flexed posture and heavy reliance on B UE support through AD to maintain upright - had +2 w/c follow  Standing with B UE support on RW performed alternating foot taps to blue vs red Blaze Pod (left=blue & right=red) targets to promote increased hip/knee flexion and weight shifting back/forth - noticed R LE not stepping back fully off target and adducting too close to L causing R lateral lean so provided external target to bring R foot back to "home base" after tapping the Blaze Pod. Tolerated doing this for 59min15seconds without rest break!! and was still able to safely step back to sit on mat.  -- Progressed this to having blaze pods up on 2" step to promote increased hip flexion  with pt continuing to have greatest difficulty lifting R LE compared to L LE  While seated EOM throughout session, pt demonstrates improved upright, midline posture with no significant R posterior lean noted today - still providing supervision for safety.   Pt continues to demonstrate improving activity tolerance!  PATIENT EDUCATION: Education details: POC. HEP to improve generalized strength and activity tolerance  Person educated: Patient and Caregiver   Education method: Medical illustrator Education comprehension: verbalized understanding  HOME EXERCISE PROGRAM: Access Code: [email or web address] URL: [email or web address] Date: 11/16/2023 Prepared by: Grier Rocher  Exercises - Seated Long Arc Quad  - 1 x daily - 7 x weekly - 3 sets - 10 reps - Seated March  - 1 x daily - 7 x weekly - 3 sets - 10 reps - 2 hold - Seated Hip Abduction  - 1 x daily - 7 x weekly - 3 sets - 10 reps - 2 hold - Supine Heel Slide  - 1 x daily - 7 x weekly - 3 sets - 10 reps - 3 hold - Supine Hip Abduction AROM  - 1 x daily - 7 x weekly - 3 sets - 10 reps - 3 hold - Supine Straight Leg Raises  - 1 x daily - 7 x weekly - 3 sets - 10  reps - 3 hold  GOALS: Goals reviewed with patient? Yes   SHORT TERM GOALS: Target date: 12/15/2023    Patient will be independent in home exercise program to improve strength/mobility for better functional independence with ADLs. Baseline: provided on 11/16/23 Goal status: INITIAL   LONG TERM GOALS: Target date: 02/09/2024    Patient will increase PSFS score to equal to or greater than and average of 2 points   to demonstrate statistically significant improvement in mobility and quality of life.  Baseline: initial eval: 4.5 Goal status: INITIAL  2.  Patient (> 14 years old) will complete five times sit to stand test in < 30 seconds indicating an increased LE strength and improved balance. Baseline: 54 sec with UE support and RW  Goal status:  INITIAL    3.  Patient will increase 10 meter walk test by 0.5m/s as to improve gait speed for better community ambulation and to reduce fall risk. Baseline: 12/05/23: pt currently requires min physical assistance and is now able to walk far enough to complete this test at next visit Goal status: INITIAL  4.  Patient will reduce timed up and go to <60 sec seconds to reduce fall risk and demonstrate improved transfer/gait ability. Baseline: 2:24 min with RW and min-mod assist  Goal status: INITIAL     ASSESSMENT:  CLINICAL IMPRESSION:  Ms. Beaupre is a 89 y.o. female who was seen today for physical therapy treatment for debility secondary to strength and sensory changes from degeneration of lumbar discs, LLE pain, R hip pain and difficulty walking. Patient continues to demonstrate functional strength deficits requiring min/light mod A for transfers and gait training. She also has difficulty with dual-task challenges due to cognitive decline from dementia. Pt with no significant R posterolateral trunk lean sitting EOM today! She continues to participate in gait training up to 105ft today using RW with pt achieving more consistent reciprocal stepping pattern and improving gait speed. Pt is very engaged with dual-cognitive tasks with external targets to promote increased force production and amplitude of her movements. Pt will benefit from continued skilled PT to address strength and balance impairments, to improve safety with bed mobility, transfers, gait and allow improvement on overall QoL and reduce care giver burden.     OBJECTIVE IMPAIRMENTS: Abnormal gait, decreased activity tolerance, decreased balance, decreased cognition, decreased coordination, decreased endurance, decreased knowledge of condition, decreased mobility, difficulty walking, decreased ROM, decreased strength, decreased safety awareness, hypomobility, impaired flexibility, impaired vision/preception, improper body mechanics,  postural dysfunction, and pain.   ACTIVITY LIMITATIONS: carrying, lifting, bending, standing, squatting, stairs, transfers, bed mobility, continence, bathing, toileting, dressing, hygiene/grooming, and locomotion level  PARTICIPATION LIMITATIONS: cleaning, laundry, shopping, and community activity  PERSONAL FACTORS: Age, Behavior pattern, Fitness, and 3+ comorbidities: HTN, L femur fx, dimentia  are also affecting patient's functional outcome.   REHAB POTENTIAL: Good  CLINICAL DECISION MAKING: Evolving/moderate complexity  EVALUATION COMPLEXITY: Moderate  PLAN:  PT FREQUENCY: 1-2x/week  PT DURATION: 12 weeks  PLANNED INTERVENTIONS: [id number]- PT Re-evaluation, 97110-Therapeutic exercises, 97530- Therapeutic activity, 97112- Neuromuscular re-education, 97535- Self Care, [id number]- Manual therapy, [phone]- Gait training, Balance training, Stair training, Visual/preceptual remediation/compensation, Cognitive remediation, DME instructions, Wheelchair mobility training, Cryotherapy, and Moist heat  PLAN FOR NEXT SESSION:  - sit>stand training with Blaze Pod sequence to promote gradual anterior lean/weight shift while rising to standing - 10 meter walk test when appropriate (currently requires min/mod A)  - gait training using LRAD - progression to side stepping and backwards stepping (in //  bars?) - incorporate L UE into activities - dual-cognitive tasks with above interventions  Overall: Activity tolerance, standing tolerance and balance, gait training. Transfer training (sits before turning fully).    Ginny Forth, PT, DPT, NCS, CSRS Physical Therapist - [place]  [facility]  3:11 PM 12/07/23

## 2023-12-12 ENCOUNTER — Ambulatory Visit: Payer: Medicare Other | Attending: Family Medicine | Admitting: Physical Therapy

## 2023-12-12 ENCOUNTER — Ambulatory Visit: Payer: Medicare Other | Admitting: Occupational Therapy

## 2023-12-12 DIAGNOSIS — M6281 Muscle weakness (generalized): Secondary | ICD-10-CM | POA: Insufficient documentation

## 2023-12-12 DIAGNOSIS — R278 Other lack of coordination: Secondary | ICD-10-CM | POA: Insufficient documentation

## 2023-12-12 DIAGNOSIS — R269 Unspecified abnormalities of gait and mobility: Secondary | ICD-10-CM | POA: Insufficient documentation

## 2023-12-12 DIAGNOSIS — R5381 Other malaise: Secondary | ICD-10-CM | POA: Insufficient documentation

## 2023-12-12 DIAGNOSIS — R262 Difficulty in walking, not elsewhere classified: Secondary | ICD-10-CM | POA: Diagnosis present

## 2023-12-12 NOTE — Therapy (Signed)
 OUTPATIENT PHYSICAL THERAPY NEURO TREATMENT   Patient Name: Kerri [name] MRN: [id number] DOB:04-26-1931, 89 y.o., female Today's Date: 12/12/2023   PCP: Wilford Corner, PA-C  REFERRING PROVIDER: Wilford Corner, PA-C   END OF SESSION:   PT End of Session - 12/12/23 1450     Visit Number 6    Number of Visits 24    Date for PT Re-Evaluation 02/08/24    PT Start Time 1449    PT Stop Time 1537    PT Time Calculation (min) 48 min    Equipment Utilized During Treatment Gait belt    Activity Tolerance Patient tolerated treatment well;No increased pain    Behavior During Therapy WFL for tasks assessed/performed                 Past Medical History:  Diagnosis Date   Arthritis    Past Surgical History:  Procedure Laterality Date   INTRAMEDULLARY (IM) NAIL INTERTROCHANTERIC Left 09/06/2022   Procedure: INTRAMEDULLARY (IM) NAIL INTERTROCHANTERIC;  Surgeon: Signa Kell, MD;  Location: [facility] OR[facility];  Service: Orthopedics;  Laterality: Left;   PACEMAKER LEADLESS INSERTION N/A 01/07/2021   Procedure: PACEMAKER LEADLESS INSERTION;  Surgeon: Marcina Millard, MD;  Location: [facility];  Service: Cardiovascular;  Laterality: N/A;   PPM GENERATOR REMOVAL N/A 01/07/2021   Procedure: PPM GENERATOR REMOVAL;  Surgeon: Marcina Millard, MD;  Location: [facility];  Service: Cardiovascular;  Laterality: N/A;   Patient Active Problem List   Diagnosis Date Noted   Acute postoperative anemia due to expected blood loss 09/07/2022   Displaced intertrochanteric fracture of left femur, initial encounter for closed fracture (HCC) 09/05/2022   Orthostatic hypotension 07/12/2022   Diarrhea 07/10/2022   COVID-19 virus infection 07/08/2022   Electrolyte abnormality 07/08/2022   Malnutrition of moderate degree 07/08/2022   Acute delirium 07/08/2022   History of recurrent UTI (urinary tract infection) 01/30/2022   Constipation    Elevated brain  natriuretic peptide (BNP) level 10/15/2021   Generalized weakness 10/15/2021   Chronic kidney disease, stage 3b (HCC) 10/15/2021   Normal pressure hydrocephalus (HCC) 10/15/2021   Dementia without behavioral disturbance (HCC) 10/15/2021   Hypothyroidism 10/15/2021   Essential hypertension 10/15/2021   Overactive bladder 10/15/2021   Moderate mitral regurgitation 01/13/2021   Status post placement of cardiac pacemaker 01/13/2021   CHB (complete heart block) (HCC) 01/07/2021   Chronic systolic CHF (congestive heart failure) (HCC) 12/17/2020   Postmenopausal osteoporosis 08/26/2016   Mixed Alzheimer's and vascular dementia (HCC) 06/13/2014    ONSET DATE: getting worse over the last year   REFERRING DIAG:  Diagnosis  M51.369 (ICD-10-CM) - Degeneration of lumbar intervertebral disc    THERAPY DIAG:   Debility  Muscle weakness (generalized)  Difficulty in walking, not elsewhere classified  Abnormality of gait and mobility   Rationale for Evaluation and Treatment: Rehabilitation  SUBJECTIVE:  SUBJECTIVE STATEMENT:  Pt reports she is doing "fine." [name], caregiver, present throughout session reporting pt had follow-up MD appointment for lab work this morning and had a few medication changes to address pt's motivation/initiation level. [name] reports pt is very sedentary at home finding it challenging to motivate patient to mobilize. Pt continues to express desire to be able to walk better throughout session. Pt does report some fear of falling today.  From Eval:  Hx given from care giver; [name] .  Hx of L femur fx roughly a year and half ago. Hx of lumbar stenosis and BLE weakness. States that they want PT to address pain and weakness to allow continued mobility as tolerated. Care giver reports that she  well complain of pain/numbness with mobility, but more willing to attemept to mobilize if "its something she wants"  Will self propel in [facility] at home  Pt accompanied by:  care giver   PERTINENT HISTORY:   From recent Neurology visit from MD.  "1. Mixed dementia (Alzheimer's disease + vascular dementia) with significant ventriculomegaly (with negative lumbar drain trial in 2015) in a patient with history of sepsis secondary to UTI treated with trimethoprim January 2023. Medication appears to help with UTI and improved mental status - reports good control with current medication - Continue Donepezil 10 mg by mouth once a day  - Continue Memantine 10 mg by mouth two times a day   - Continue keeping patient involved in daily activities, such as reading out loud, helping prepare meals, picking out clothes.  - Recommend patient does not live alone. Patient should have 24/7 supervision.   2. Right leg pain with inability to pick up feet (right>left - likely from ventriculomegaly + peripheral neuropathy) patient with history of lumbar disc disease with stenosis - reports improvement during recent trial of prednisone. Suspicious for lumbar Disc Disease in addition to decondition Reviewed PCP note: Ongoing leg pain with intermittent weakness. On exam she does have some swelling around the right knee. Unsure if she could have some severe arthritis catching as she walks. Will obtain x-ray of the right knee today. Trial of prednisone 20 mg once a day for 5 days. We did discuss that she has some degenerative lumbar disc disease with stenosis and likely playing a role in right leg weakness. She has a pacemaker and we will defer MRI of the lumbar spine. Keep follow-up with neurology and if they feel it is necessary they could possibly pursue nerve conduction study. Inform caretaker to help patient and stay by her side when walking.   - Patient says that her legs feel "heavy" and that she has difficulty initiating the  first step when walking - consistent with magnetic gait in patient with hydrocephalus and brain atrophy - Previous (-) Lumbar Puncture trial at [facility] (01/09/14)."   PAIN:  Are you having pain? No  PRECAUTIONS: Fall  RED FLAGS: None   WEIGHT BEARING RESTRICTIONS: No  FALLS: Has patient fallen in last 6 months? Yes. Number of falls 2-3 controlled falls with assist to floor.    LIVING ENVIRONMENT: Lives with:  caregiver 7 days a week. Alone for an hour or 2 a day, but only when alseep.   Lives in: House/apartment Stairs: Yes: Internal: 1 but ramp in place steps; none and External: 1 steps; none Has following equipment at home: Walker - 2 wheeled  PLOF: Requires assistive device for independence, Needs assistance with ADLs, Needs assistance with homemaking, Needs assistance with gait, and Needs assistance  with transfers  PATIENT GOALS: get stronger so she can "go and do"   OBJECTIVE:  Note: Objective measures were completed at Evaluation unless otherwise noted.  DIAGNOSTIC FINDINGS:   EXAM: CT LUMBAR SPINE WITHOUT CONTRAST IMPRESSION: 1. Transitional anatomy. Adhering to convention from the prior lumbar spine MRI, the last fully formed disc space is labeled L5-S1. 2. Unchanged chronic compression fractures of L4 and L5. No new fracture of the lumbar spine. 3. Unchanged lumbar spondylosis notable for moderate narrowing of the right lateral recess at L1-2 and L2-3, as well as mild spinal canal stenosis at L2-3 and L3-4.   COGNITION: Overall cognitive status: History of cognitive impairments - at baseline   SENSATION: WFL  COORDINATION: Decreased speed of movement. Decreased ability to follow instructions with MMT   EDEMA:  Mild BLE distal edema   MUSCLE TONE: WFL  MUSCLE LENGTH: Generalized tightness from sedentary   DTRs:  Not assessed   POSTURE: rounded shoulders, forward head, increased lumbar lordosis, and increased thoracic kyphosis  LOWER EXTREMITY ROM:      Unable formally assess due to    LOWER EXTREMITY MMT:    MMT Right Eval Left Eval  Difficult to assess due to cognitive impairments, grossly 3+/5 to 4/5 proximal to distal with functional movement    BED MOBILITY:  To be assessed at later visit.   TRANSFERS: Assistive device utilized: Environmental consultant - 2 wheeled  Sit to stand: Mod A Stand to sit: Mod A Chair to chair: Mod A Floor:  unable to complete without total A per care giver   RAMP:  Level of Assistance: Mod A Assistive device utilized: Environmental consultant - 2 wheeled Ramp Comments: caregiver reports use of RW to step down in to home entry   CURB:  Level of Assistance:  not assessed at eval  Assistive device utilized:  rails or RW  Curb Comments: care giver reports use of RW   STAIRS: Level of Assistance:  not assessed at [employer] Technique: Step to Pattern with Bilateral Rails Number of Stairs:   Height of Stairs:   Comments: no use of stairs at home for >1.5 years   GAIT: Gait pattern: decreased step length- Right, decreased stance time- Left, decreased hip/knee flexion- Right, knee flexed in stance- Right, knee flexed in stance- Left, and poor foot clearance- Right Distance walked: 20 Assistive device utilized: Walker - 2 wheeled Level of assistance: Min A and Mod A Comments: R foot drag with TUG. Increased flexed knee with fatigue.   FUNCTIONAL TESTS:  5 times sit to stand: 54 Timed up and go (TUG): 2:24 with min-mod assost  10 meter walk test: TBD   PATIENT SURVEYS:  PsFS: 4.5.   Getting into and out of bed: 0  Standing erect with walking: 5 Walking to bathroom: 4  TREATMENT DATE:   12/12/23  Pt arrives to session in transport chair. Unless otherwise stated, min A was provided and gait belt donned in order to ensure pt safety throughout.  Sit>stand transport chair>RW mod A  at beginning of session today with poor ability to power up into standing - progressed to min A during session - continues to require repeat cuing to push-up from seat and for increased anterior weight shift when initiating rising.  Gait training 14ft using RW with skilled light mod A and +2 w/c follow. Pt demos: - stronger R lean today lacking L weight shift onto L stance phase, appears pt has significant hip drop during L stance, likely contributing to poor R foot clearance during swing advancement - poor B LE foot clearance during swing, R more impaired than L, verbal cuing for increased step length to achieve reciprocal stepping pattern  - overall crouched posture throughout gait that worsens with fatigue - slight manual facilitation for weight shifting onto stance limbs to allow increased contralateral step length - very slow gait speed  - excessive forward trunk flexed posture and heavy reliance on B UE support through AD to maintain upright *Pt with increased fatigue today due to having more activities prior to therapy session.  Side stepping in // bars down/back x2 each direction (initially requires seated break at each end, but on 2nd trial is able to make it down and back without sitting) with min A for balance  - continues to have more difficulty stepping towards R compared to L - verbal and tactile cuing for increased step length with pt responding best to external targets.  Forwards/backwards walking in // bars x1 down/back with min A for balance - excessive forward trunk flexed posture with gradual worsening crouched posture when walking backwards - multimodal cuing for increased step lengths bilaterally and gentle facilitation for weight shifting.  Sit>stands from EOM to no AD but using R HHA for pt security - using Blaze Pods as external target set on sequence to have targets for transitioning from anterior trunk lean and then to trunk/hip/knee extension to rise to full standing x2  minutes - pt with improved ability to power up with this motivational target.  Gait training additional 16ft using RW with min A this time and pt having improving step lengths bilaterally - although continues to have poor R foot clearance compared to L - continues to have gradual worsening into crouched posture with fatigue. +2 w/c follow for safety   PATIENT EDUCATION: Education details: POC. HEP to improve generalized strength and activity tolerance  Person educated: Patient and Caregiver   Education method: Medical illustrator Education comprehension: verbalized understanding  HOME EXERCISE PROGRAM: Access Code: [email or web address] URL: [email or web address] Date: 11/16/2023 Prepared by: Grier Rocher  Exercises - Seated Long Arc Quad  - 1 x daily - 7 x weekly - 3 sets - 10 reps - Seated March  - 1 x daily - 7 x weekly - 3 sets - 10 reps - 2 hold - Seated Hip Abduction  - 1 x daily - 7 x weekly - 3 sets - 10 reps - 2 hold - Supine Heel Slide  - 1 x daily - 7 x weekly - 3 sets - 10 reps - 3 hold - Supine Hip Abduction AROM  - 1 x daily - 7 x weekly - 3 sets - 10 reps - 3 hold - Supine Straight Leg Raises  - 1 x daily - 7 x weekly - 3 sets -  10 reps - 3 hold  GOALS: Goals reviewed with patient? Yes   SHORT TERM GOALS: Target date: 12/15/2023    Patient will be independent in home exercise program to improve strength/mobility for better functional independence with ADLs. Baseline: provided on 11/16/23 Goal status: INITIAL   LONG TERM GOALS: Target date: 02/09/2024    Patient will increase PSFS score to equal to or greater than and average of 2 points   to demonstrate statistically significant improvement in mobility and quality of life.  Baseline: initial eval: 4.5 Goal status: INITIAL  2.  Patient (> 1 years old) will complete five times sit to stand test in < 30 seconds indicating an increased LE strength and improved balance. Baseline: 54 sec with UE support  and RW  Goal status: INITIAL    3.  Patient will increase 10 meter walk test by 0.3m/s as to improve gait speed for better community ambulation and to reduce fall risk. Baseline: 12/05/23: pt currently requires min physical assistance and is now able to walk far enough to complete this test at next visit Goal status: INITIAL  4.  Patient will reduce timed up and go to <60 sec seconds to reduce fall risk and demonstrate improved transfer/gait ability. Baseline: 2:24 min with RW and min-mod assist  Goal status: INITIAL     ASSESSMENT:  CLINICAL IMPRESSION:  Ms. Quakenbush is a 89 y.o. female who was seen today for physical therapy treatment for debility secondary to strength and sensory changes from degeneration of lumbar discs, LLE pain, R hip pain and difficulty walking. Patient continues to demonstrate functional strength deficits requiring min/light mod A for transfers and gait training. She also has difficulty with dual-task challenges due to cognitive decline from dementia. Pt with no significant R posterolateral trunk lean sitting EOM today! She continues to participate in gait training up to 27ft today using RW with pt achieving more consistent reciprocal stepping pattern and improving gait speed, although more crouched today due to increased fatigue. Pt is very engaged with dual-cognitive tasks using Blaze Pods as external targets to promote increased force production, speed, and amplitude of her movements. She will continue to benefit from high-intensity exercise training for promotion of overall neural recovery and increased endurance. Pt will benefit from continued skilled PT to address strength and balance impairments, to improve safety with bed mobility, transfers, gait and allow improvement on overall QoL and reduce care giver burden.     OBJECTIVE IMPAIRMENTS: Abnormal gait, decreased activity tolerance, decreased balance, decreased cognition, decreased coordination, decreased  endurance, decreased knowledge of condition, decreased mobility, difficulty walking, decreased ROM, decreased strength, decreased safety awareness, hypomobility, impaired flexibility, impaired vision/preception, improper body mechanics, postural dysfunction, and pain.   ACTIVITY LIMITATIONS: carrying, lifting, bending, standing, squatting, stairs, transfers, bed mobility, continence, bathing, toileting, dressing, hygiene/grooming, and locomotion level  PARTICIPATION LIMITATIONS: cleaning, laundry, shopping, and community activity  PERSONAL FACTORS: Age, Behavior pattern, Fitness, and 3+ comorbidities: HTN, L femur fx, dimentia  are also affecting patient's functional outcome.   REHAB POTENTIAL: Good  CLINICAL DECISION MAKING: Evolving/moderate complexity  EVALUATION COMPLEXITY: Moderate  PLAN:  PT FREQUENCY: 1-2x/week  PT DURATION: 12 weeks  PLANNED INTERVENTIONS: 97164- PT Re-evaluation, 97110-Therapeutic exercises, 97530- Therapeutic activity, 97112- Neuromuscular re-education, 97535- Self Care, [id number]- Manual therapy, [phone]- Gait training, Balance training, Stair training, Visual/preceptual remediation/compensation, Cognitive remediation, DME instructions, Wheelchair mobility training, Cryotherapy, and Moist heat  PLAN FOR NEXT SESSION:  - supine bridging and L hip strengthening for improved stance control -  sit>stand training with Blaze Pod sequence to promote gradual anterior lean/weight shift while rising to standing - 10 meter walk test when appropriate (currently requires min/mod A)  - gait training using LRAD  - continue to side stepping and backwards stepping (in // bars?) over targets  - incorporate L UE into activities - dual-cognitive tasks with above interventions  Overall: Activity tolerance, standing tolerance and balance, gait training. Transfer training (sits before turning fully).    Ginny Forth, PT, DPT, NCS, CSRS Physical Therapist - [place]  [facility]  5:42 PM 12/12/23

## 2023-12-12 NOTE — Therapy (Signed)
 OUTPATIENT OCCUPATIONAL THERAPY NEURO EVALUATION  Patient Name: Kerri [name] MRN: [id number] DOB:1930/12/17, 89 y.o., female Today's Date: 12/13/2023  PCP: Wilford Corner, PA-C REFERRING PROVIDER: Lonell Face, MD  END OF SESSION:  OT End of Session - 12/13/23 0810     Visit Number 1    Number of Visits 12    Date for OT Re-Evaluation 03/06/24    OT Start Time 1400    OT Stop Time 1445    OT Time Calculation (min) 45 min    Activity Tolerance Patient tolerated treatment well    Behavior During Therapy [facility] for tasks assessed/performed             Past Medical History:  Diagnosis Date   Arthritis    Past Surgical History:  Procedure Laterality Date   INTRAMEDULLARY (IM) NAIL INTERTROCHANTERIC Left 09/06/2022   Procedure: INTRAMEDULLARY (IM) NAIL INTERTROCHANTERIC;  Surgeon: Signa Kell, MD;  Location: [facility] ORS;  Service: Orthopedics;  Laterality: Left;   PACEMAKER LEADLESS INSERTION N/A 01/07/2021   Procedure: PACEMAKER LEADLESS INSERTION;  Surgeon: Marcina Millard, MD;  Location: [facility] IN[facility]IVE [facility];  Service: Cardiovascular;  Laterality: N/A;   PPM GENERATOR REMOVAL N/A 01/07/2021   Procedure: PPM GENERATOR REMOVAL;  Surgeon: Marcina Millard, MD;  Location: [facility] INV[facility] CV [facility];  Service: Cardiovascular;  Laterality: N/A;   Patient Active Problem List   Diagnosis Date Noted   Acute postoperative anemia due to expected blood loss 09/07/2022   Displaced intertrochanteric fracture of left femur, initial encounter for closed fracture (HCC) 09/05/2022   Orthostatic hypotension 07/12/2022   Diarrhea 07/10/2022   COVID-19 virus infection 07/08/2022   Electrolyte abnormality 07/08/2022   Malnutrition of moderate degree 07/08/2022   Acute delirium 07/08/2022   History of recurrent UTI (urinary tract infection) 01/30/2022   Constipation    Elevated brain natriuretic peptide (BNP) level 10/15/2021   Generalized weakness 10/15/2021   Chronic kidney  disease, stage 3b (HCC) 10/15/2021   Normal pressure hydrocephalus (HCC) 10/15/2021   Dementia without behavioral disturbance (HCC) 10/15/2021   Hypothyroidism 10/15/2021   Essential hypertension 10/15/2021   Overactive bladder 10/15/2021   Moderate mitral regurgitation 01/13/2021   Status post placement of cardiac pacemaker 01/13/2021   CHB (complete heart block) (HCC) 01/07/2021   Chronic systolic CHF (congestive heart failure) (HCC) 12/17/2020   Postmenopausal osteoporosis 08/26/2016   Mixed Alzheimer's and vascular dementia (HCC) 06/13/2014    ONSET DATE: 09/05/2022  REFERRING DIAG: Intertrochanteric Left Femur Fracture, Lumbar Disc Disease with stenosis  THERAPY DIAG:  Muscle weakness (generalized)  Rationale for Evaluation and Treatment: Rehabilitation  SUBJECTIVE:   SUBJECTIVE STATEMENT: Pt. Accompanied to the Initial Eval by her personal Caregiver Pt accompanied by:  Kerri [name]-Personal Caregiver  PERTINENT HISTORY: Pt. Is a 89 y.o. female who sustained an Intertrochanteric Left Femur Fracture, Lumbar Disc Disease with Stenosis, Chronic compression Fractures. 09/05/2022, PMHx includes: Mixed Dementia (Alzheimer's Disease, and Vascular Dementia), Pacemaker, Mild spinal canal stenosis L2-3 & L3-4, Hx of UTI.  PRECAUTIONS: Pacemaker (6-8 years)  WEIGHT BEARING RESTRICTIONS: No  PAIN:  Are you having pain? No  FALLS: Has patient fallen in last 6 months? No, 3 assisted sitdowns to the flloor- When Pt. feels like she is not able to walk any further.  LIVING ENVIRONMENT: Lives with: Kerri [name] Lives in: House Stairs:  One story with a basement; 2 steps to enter from the garage. In step down to the den; and and one step ramp into the kitchen Has following equipment at home: Wheelchair (  manual), walker, hospital bed with rails, and a lift chair, BCommode, and shower chair. Gait belt, Life Alert, hand held shower head.  PLOF: Independent  PATIENT GOALS:  To be able to stand, and walk  to the bathroom. Left  hand -Pt. Is not using her left hand much  OBJECTIVE:  Note: Objective measures were completed at Evaluation unless otherwise noted.  HAND DOMINANCE: Right  ADLs:  Transfers/ambulation related to ADLs: Eating: Independent, assist with set-up for cutting food Grooming: Independent set-up, seated  UB Dressing: Independent with set-up LB Dressing: Mod/MaxA pants, and socks Toileting: Assist with toilet transfers, clothing negotiation, Independent toilet hygiene. Some incontinence episodes Bathing: MaxA Tub Shower transfers: Max A shower transfers  IADLs: Shopping: Dependent. Will accompany at times. Light housekeeping: Occasionally wipes the table, and puts items in the trash, folds laundry when brought sitting Meal Prep: Will help assist with baking/mixing items, no cooking, meals provided for the Pt. Community mobility: Relies on family and friends Medication management: Medication management provided for the Pt. Financial management:  No change from baseline   MOBILITY STATUS: Hx of Falls, Needs assist   FUNCTIONAL OUTCOME MEASURES: TBD  UPPER EXTREMITY ROM:    Active ROM Right eval Left eval  Shoulder flexion 119(140) 112(130)  Shoulder abduction 112 98  Shoulder adduction    Shoulder extension    Shoulder internal rotation    Shoulder external rotation    Elbow flexion WNL WNL  Elbow extension WNL WNL  Wrist flexion    Wrist extension WNL WNL  Wrist ulnar deviation    Wrist radial deviation    Wrist pronation    Wrist supination    (Blank rows = not tested)  UPPER EXTREMITY MMT:     MMT Right eval Left eval  Shoulder flexion 3-/5 3-/5  Shoulder abduction 3-/5 3-/5  Shoulder adduction    Shoulder extension    Shoulder internal rotation    Shoulder external rotation    Middle trapezius    Lower trapezius    Elbow flexion 4/5 3+/5  Elbow extension 4/5 3+/5  Wrist flexion    Wrist extension 4/5 3+/5  Wrist ulnar deviation     Wrist radial deviation    Wrist pronation    Wrist supination    (Blank rows = not tested)  HAND FUNCTION: Grip strength: Right: 29 lbs; Left: 21 lbs  COORDINATION: TBD 9 Hole Peg test: Right:   sec; Left:   sec  SENSATION: Light touch: Inconsistent  COGNITION: Overall cognitive status: History of cognitive impairments - at baseline Alzheimer's Dementia, and Vascular Dementia  VISION: Subjective report:  Glasses all the time   VISION ASSESSMENT: Wears glasses all the time at baseline                                                                                                                            TREATMENT DATE: 12/13/2023  Eval completed. Refer to below for Pt./Caregiver education  PATIENT EDUCATION: Education details: OT services, POC, goals, Pt. UE, and Functional status Person educated: Patient and Caregiver Kerri [name] Education method: Explanation Education comprehension: needs further education  HOME EXERCISE PROGRAM: To be assessed, and provided as indicated   GOALS: Goals reviewed with patient? Yes  SHORT TERM GOALS: Target date: 01/24/2024    Pt. Will require Supervision for BUE HEPs Baseline: Eval: No current HEP Goal status: INITIAL   LONG TERM GOALS: Target date: 03/06/2024  Pt. Will increase BUE strength by 2 mm grades to assist with ADLs/IADLs. Baseline: Eval: Right: shoulder flexion 3-/5, abduction 3-/5, elbow flexion 4/5, extension 4/5, wrist extension 4/5; Left: shoulder flexion: 3-/5, abduction: 3-/5, elbow flexion 3+/5, extension 3+/5, wrist extension 3+/5 Goal status: INITIAL  2.  Pt. Will improve left grip strength by 4 # to assist with hiking pants. Baseline: R: 29#, L: 21# Goal status: INITIAL  3.  Pt. Will initiate engaging the left hand  during daily activity 75% of the time with minimal cuing Baseline: Eval: Limited initiation , and engagement of the LUE during daily tasks.  Goal status: INITIAL  4.  Pt. Demonstrate  compensatory adaptive techniques to assist with ADLs/IADLs. Baseline: Eval: Education to be provided Goal status: INITIAL  ASSESSMENT:  CLINICAL IMPRESSION:  Patient is a 89 y.o. female who was seen today for occupational therapy evaluation for History Left Femur Fracture, spinal stenosis. Pt. has a history of Mixed Dementia (Alzheimer's Disease, and Vascular Dementia). Pt. presents with decreased BUE shoulder ROM, limited LUE strength, and limited engagement of the RUE during ADLs, and IADL tasks. Pt. will benefit from OT services to work on improving LUE functioning in order to improve, and maximize participation with ADLs, and IADL tasks.  PERFORMANCE DEFICITS: in functional skills including ADLs, IADLs, coordination, dexterity, proprioception, ROM, strength, Fine motor control, Gross motor control, mobility, decreased knowledge of use of DME, and UE functional use, cognitive skills including attention, problem solving, and safety awareness, and psychosocial skills including environmental adaptation and routines and behaviors.   IMPAIRMENTS: are limiting patient from ADLs, IADLs, education, and leisure.   CO-MORBIDITIES: has  other co-morbidities that affects occupational performance. Patient will benefit from skilled OT to address above impairments and improve overall function.  MODIFICATION OR ASSISTANCE TO COMPLETE EVALUATION: Min-Moderate modification of tasks or assist with assess necessary to complete an evaluation.  OT OCCUPATIONAL PROFILE AND HISTORY: Detailed assessment: Review of records and additional review of physical, cognitive, psychosocial history related to current functional performance.  CLINICAL DECISION MAKING: Moderate - several treatment options, min-mod task modification necessary  REHAB POTENTIAL: Good  EVALUATION COMPLEXITY: Moderate    PLAN:  OT FREQUENCY: 1x/week  OT DURATION: 12 weeks  PLANNED INTERVENTIONS: 97168 OT Re-evaluation, 97535 self care/ADL  training, 01027 therapeutic exercise, 97530 therapeutic activity, 97112 neuromuscular re-education, 97140 manual therapy, 97018 paraffin, 25366 moist heat, 97034 contrast bath, passive range of motion, functional mobility training, patient/family education, and DME and/or AE instructions  RECOMMENDED OTHER SERVICES: PT  CONSULTED AND AGREED WITH PLAN OF CARE: Patient and family member/caregiver  PLAN FOR NEXT SESSION: Treatment   Olegario Messier, MS, OTR/L 12/13/2023, 8:13 AM

## 2023-12-13 NOTE — Addendum Note (Signed)
 Addended by: Avon Gully on: 12/13/2023 04:42 PM   Modules accepted: Orders

## 2023-12-14 ENCOUNTER — Ambulatory Visit: Payer: Medicare Other | Admitting: Physical Therapy

## 2023-12-14 NOTE — Therapy (Deleted)
 OUTPATIENT PHYSICAL THERAPY NEURO TREATMENT   Patient Name: Kerri [name] MRN: [id number] DOB:09/04/1931, 89 y.o., female Today's Date: 12/14/2023   PCP: Wilford Corner, PA-C  REFERRING PROVIDER: Wilford Corner, PA-C   END OF SESSION: ***        Past Medical History:  Diagnosis Date   Arthritis    Past Surgical History:  Procedure Laterality Date   INTRAMEDULLARY (IM) NAIL INTERTROCHANTERIC Left 09/06/2022   Procedure: INTRAMEDULLARY (IM) NAIL INTERTROCHANTERIC;  Surgeon: Signa Kell, MD;  Location: [facility];  Service: Orthopedics;  Laterality: Left;   PACEMAKER LEADLESS INSERTION N/A 01/07/2021   Procedure: PACEMAKER LEADLESS INSERTION;  Surgeon: Marcina Millard, MD;  Location: [facility];  Service: Cardiovascular;  Laterality: N/A;   PPM GENERATOR REMOVAL N/A 01/07/2021   Procedure: PPM GENERATOR REMOVAL;  Surgeon: Marcina Millard, MD;  Location: [facility];  Service: Cardiovascular;  Laterality: N/A;   Patient Active Problem List   Diagnosis Date Noted   Acute postoperative anemia due to expected blood loss 09/07/2022   Displaced intertrochanteric fracture of left femur, initial encounter for closed fracture (HCC) 09/05/2022   Orthostatic hypotension 07/12/2022   Diarrhea 07/10/2022   COVID-19 virus infection 07/08/2022   Electrolyte abnormality 07/08/2022   Malnutrition of moderate degree 07/08/2022   Acute delirium 07/08/2022   History of recurrent UTI (urinary tract infection) 01/30/2022   Constipation    Elevated brain natriuretic peptide (BNP) level 10/15/2021   Generalized weakness 10/15/2021   Chronic kidney disease, stage 3b (HCC) 10/15/2021   Normal pressure hydrocephalus (HCC) 10/15/2021   Dementia without behavioral disturbance (HCC) 10/15/2021   Hypothyroidism 10/15/2021   Essential hypertension 10/15/2021   Overactive bladder 10/15/2021   Moderate mitral regurgitation 01/13/2021   Status post  placement of cardiac pacemaker 01/13/2021   CHB (complete heart block) (HCC) 01/07/2021   Chronic systolic CHF (congestive heart failure) (HCC) 12/17/2020   Postmenopausal osteoporosis 08/26/2016   Mixed Alzheimer's and vascular dementia (HCC) 06/13/2014    ONSET DATE: getting worse over the last year   REFERRING DIAG:  Diagnosis  M51.369 (ICD-10-CM) - Degeneration of lumbar intervertebral disc    THERAPY DIAG:  *** No diagnosis found.   Rationale for Evaluation and Treatment: Rehabilitation  SUBJECTIVE:                                                                                                                                                                                             SUBJECTIVE STATEMENT:  ***  Pt reports she is doing "fine." Kerri [name], caregiver, present throughout session reporting pt had follow-up MD appointment for lab work this  morning and had a few medication changes to address pt's motivation/initiation level. Kerri [name] reports pt is very sedentary at home finding it challenging to motivate patient to mobilize. Pt continues to express desire to be able to walk better throughout session. Pt does report some fear of falling today.  From Eval:  Hx given from care giver; Kerri [name] .  Hx of L femur fx roughly a year and half ago. Hx of lumbar stenosis and BLE weakness. States that they want PT to address pain and weakness to allow continued mobility as tolerated. Care giver reports that she well complain of pain/numbness with mobility, but more willing to attemept to mobilize if "its something she wants"  Will self propel in [facility] at home  Pt accompanied by:  care giver   PERTINENT HISTORY:   From recent Neurology visit from MD.  "1. Mixed dementia (Alzheimer's disease + vascular dementia) with significant ventriculomegaly (with negative lumbar drain trial in 2015) in a patient with history of sepsis secondary to UTI treated with trimethoprim January 2023. Medication appears to  help with UTI and improved mental status - reports good control with current medication - Continue Donepezil 10 mg by mouth once a day  - Continue Memantine 10 mg by mouth two times a day   - Continue keeping patient involved in daily activities, such as reading out loud, helping prepare meals, picking out clothes.  - Recommend patient does not live alone. Patient should have 24/7 supervision.   2. Right leg pain with inability to pick up feet (right>left - likely from ventriculomegaly + peripheral neuropathy) patient with history of lumbar disc disease with stenosis - reports improvement during recent trial of prednisone. Suspicious for lumbar Disc Disease in addition to decondition Reviewed PCP note: Ongoing leg pain with intermittent weakness. On exam she does have some swelling around the right knee. Unsure if she could have some severe arthritis catching as she walks. Will obtain x-ray of the right knee today. Trial of prednisone 20 mg once a day for 5 days. We did discuss that she has some degenerative lumbar disc disease with stenosis and likely playing a role in right leg weakness. She has a pacemaker and we will defer MRI of the lumbar spine. Keep follow-up with neurology and if they feel it is necessary they could possibly pursue nerve conduction study. Inform caretaker to help patient and stay by her side when walking.   - Patient says that her legs feel "heavy" and that she has difficulty initiating the first step when walking - consistent with magnetic gait in patient with hydrocephalus and brain atrophy - Previous (-) Lumbar Puncture trial at [facility] (01/09/14)."   PAIN:  Are you having pain? No  PRECAUTIONS: Fall  RED FLAGS: None   WEIGHT BEARING RESTRICTIONS: No  FALLS: Has patient fallen in last 6 months? Yes. Number of falls 2-3 controlled falls with assist to floor.    LIVING ENVIRONMENT: Lives with:  caregiver 7 days a week. Alone for an hour or 2 a day, but only when  alseep.   Lives in: House/apartment Stairs: Yes: Internal: 1 but ramp in place steps; none and External: 1 steps; none Has following equipment at home: Walker - 2 wheeled  PLOF: Requires assistive device for independence, Needs assistance with ADLs, Needs assistance with homemaking, Needs assistance with gait, and Needs assistance with transfers  PATIENT GOALS: get stronger so she can "go and do"   OBJECTIVE:  Note: Objective measures were completed at Evaluation  unless otherwise noted.  DIAGNOSTIC FINDINGS:   EXAM: CT LUMBAR SPINE WITHOUT CONTRAST IMPRESSION: 1. Transitional anatomy. Adhering to convention from the prior lumbar spine MRI, the last fully formed disc space is labeled L5-S1. 2. Unchanged chronic compression fractures of L4 and L5. No new fracture of the lumbar spine. 3. Unchanged lumbar spondylosis notable for moderate narrowing of the right lateral recess at L1-2 and L2-3, as well as mild spinal canal stenosis at L2-3 and L3-4.   COGNITION: Overall cognitive status: History of cognitive impairments - at baseline   SENSATION: WFL  COORDINATION: Decreased speed of movement. Decreased ability to follow instructions with MMT   EDEMA:  Mild BLE distal edema   MUSCLE TONE: WFL  MUSCLE LENGTH: Generalized tightness from sedentary   DTRs:  Not assessed   POSTURE: rounded shoulders, forward head, increased lumbar lordosis, and increased thoracic kyphosis  LOWER EXTREMITY ROM:     Unable formally assess due to    LOWER EXTREMITY MMT:    MMT Right Eval Left Eval  Difficult to assess due to cognitive impairments, grossly 3+/5 to 4/5 proximal to distal with functional movement    BED MOBILITY:  To be assessed at later visit.   TRANSFERS: Assistive device utilized: Environmental consultant - 2 wheeled  Sit to stand: Mod A Stand to sit: Mod A Chair to chair: Mod A Floor:  unable to complete without total A per care giver   RAMP:  Level of Assistance: Mod  A Assistive device utilized: Environmental consultant - 2 wheeled Ramp Comments: caregiver reports use of RW to step down in to home entry   CURB:  Level of Assistance:  not assessed at eval  Assistive device utilized:  rails or RW  Curb Comments: care giver reports use of RW   STAIRS: Level of Assistance:  not assessed at [employer] Technique: Step to Pattern with Bilateral Rails Number of Stairs:   Height of Stairs:   Comments: no use of stairs at home for >1.5 years   GAIT: Gait pattern: decreased step length- Right, decreased stance time- Left, decreased hip/knee flexion- Right, knee flexed in stance- Right, knee flexed in stance- Left, and poor foot clearance- Right Distance walked: 20 Assistive device utilized: Walker - 2 wheeled Level of assistance: Min A and Mod A Comments: R foot drag with TUG. Increased flexed knee with fatigue.   FUNCTIONAL TESTS:  5 times sit to stand: 54 Timed up and go (TUG): 2:24 with min-mod assost  10 meter walk test: TBD   PATIENT SURVEYS:  PsFS: 4.5.   Getting into and out of bed: 0  Standing erect with walking: 5 Walking to bathroom: 4                                                                                                                               TREATMENT DATE:   12/14/23  ***  Pt arrives to session in transport chair. Unless  otherwise stated, min A was provided and gait belt donned in order to ensure pt safety throughout.  ***  Sit>stand transport chair>RW mod A at beginning of session today with poor ability to power up into standing - progressed to min A during session - continues to require repeat cuing to push-up from seat and for increased anterior weight shift when initiating rising.  Gait training 72ft using RW with skilled light mod A and +2 w/c follow. Pt demos: - stronger R lean today lacking L weight shift onto L stance phase, appears pt has significant hip drop during L stance, likely contributing to poor R  foot clearance during swing advancement - poor B LE foot clearance during swing, R more impaired than L, verbal cuing for increased step length to achieve reciprocal stepping pattern  - overall crouched posture throughout gait that worsens with fatigue - slight manual facilitation for weight shifting onto stance limbs to allow increased contralateral step length - very slow gait speed  - excessive forward trunk flexed posture and heavy reliance on B UE support through AD to maintain upright *Pt with increased fatigue today due to having more activities prior to therapy session.  Side stepping in // bars down/back x2 each direction (initially requires seated break at each end, but on 2nd trial is able to make it down and back without sitting) with min A for balance  - continues to have more difficulty stepping towards R compared to L - verbal and tactile cuing for increased step length with pt responding best to external targets.  Forwards/backwards walking in // bars x1 down/back with min A for balance - excessive forward trunk flexed posture with gradual worsening crouched posture when walking backwards - multimodal cuing for increased step lengths bilaterally and gentle facilitation for weight shifting.  Sit>stands from EOM to no AD but using R HHA for pt security - using Blaze Pods as external target set on sequence to have targets for transitioning from anterior trunk lean and then to trunk/hip/knee extension to rise to full standing x2 minutes - pt with improved ability to power up with this motivational target.  Gait training additional 42ft using RW with min A this time and pt having improving step lengths bilaterally - although continues to have poor R foot clearance compared to L - continues to have gradual worsening into crouched posture with fatigue. +2 w/c follow for safety  ***   PATIENT EDUCATION: Education details: POC. HEP to improve generalized strength and activity tolerance   Person educated: Patient and Caregiver   Education method: Medical illustrator Education comprehension: verbalized understanding  HOME EXERCISE PROGRAM: Access Code: [email or web address] URL: [email or web address] Date: 11/16/2023 Prepared by: Grier Rocher  Exercises - Seated Long Arc Quad  - 1 x daily - 7 x weekly - 3 sets - 10 reps - Seated March  - 1 x daily - 7 x weekly - 3 sets - 10 reps - 2 hold - Seated Hip Abduction  - 1 x daily - 7 x weekly - 3 sets - 10 reps - 2 hold - Supine Heel Slide  - 1 x daily - 7 x weekly - 3 sets - 10 reps - 3 hold - Supine Hip Abduction AROM  - 1 x daily - 7 x weekly - 3 sets - 10 reps - 3 hold - Supine Straight Leg Raises  - 1 x daily - 7 x weekly - 3 sets - 10 reps - 3 hold  GOALS: Goals reviewed  with patient? Yes   SHORT TERM GOALS: Target date: 12/15/2023   Patient will be independent in home exercise program to improve strength/mobility for better functional independence with ADLs. Baseline: provided on 11/16/23 Goal status: INITIAL   LONG TERM GOALS: Target date: 02/09/2024    Patient will increase PSFS score to equal to or greater than and average of 2 points   to demonstrate statistically significant improvement in mobility and quality of life.  Baseline: initial eval: 4.5 Goal status: INITIAL  2.  Patient (> 79 years old) will complete five times sit to stand test in < 30 seconds indicating an increased LE strength and improved balance. Baseline: 54 sec with UE support and RW  Goal status: INITIAL   3.  Patient will increase 10 meter walk test by 0.71m/s as to improve gait speed for better community ambulation and to reduce fall risk. Baseline: 12/05/23: pt currently requires min physical assistance and is now able to walk far enough to complete this test at next visit Goal status: INITIAL  4.  Patient will reduce timed up and go to <60 sec seconds to reduce fall risk and demonstrate improved transfer/gait  ability. Baseline: 2:24 min with RW and min-mod assist  Goal status: INITIAL     ASSESSMENT:  CLINICAL IMPRESSION: *** Kerri [name] is a 89 y.o. female who was seen today for physical therapy treatment for debility secondary to strength and sensory changes from degeneration of lumbar discs, LLE pain, R hip pain and difficulty walking. Patient continues to demonstrate functional strength deficits requiring min/light mod A for transfers and gait training. She also has difficulty with dual-task challenges due to cognitive decline from dementia. Pt with no significant R posterolateral trunk lean sitting EOM today! She continues to participate in gait training up to 40ft today using RW with pt achieving more consistent reciprocal stepping pattern and improving gait speed, although more crouched today due to increased fatigue. Pt is very engaged with dual-cognitive tasks using Blaze Pods as external targets to promote increased force production, speed, and amplitude of her movements. She will continue to benefit from high-intensity exercise training for promotion of overall neural recovery and increased endurance. Pt will benefit from continued skilled PT to address strength and balance impairments, to improve safety with bed mobility, transfers, gait and allow improvement on overall QoL and reduce care giver burden.     OBJECTIVE IMPAIRMENTS: Abnormal gait, decreased activity tolerance, decreased balance, decreased cognition, decreased coordination, decreased endurance, decreased knowledge of condition, decreased mobility, difficulty walking, decreased ROM, decreased strength, decreased safety awareness, hypomobility, impaired flexibility, impaired vision/preception, improper body mechanics, postural dysfunction, and pain.   ACTIVITY LIMITATIONS: carrying, lifting, bending, standing, squatting, stairs, transfers, bed mobility, continence, bathing, toileting, dressing, hygiene/grooming, and locomotion  level  PARTICIPATION LIMITATIONS: cleaning, laundry, shopping, and community activity  PERSONAL FACTORS: Age, Behavior pattern, Fitness, and 3+ comorbidities: HTN, L femur fx, dimentia  are also affecting patient's functional outcome.   REHAB POTENTIAL: Good  CLINICAL DECISION MAKING: Evolving/moderate complexity  EVALUATION COMPLEXITY: Moderate  PLAN:  PT FREQUENCY: 1-2x/week  PT DURATION: 12 weeks  PLANNED INTERVENTIONS: 97164- PT Re-evaluation, 97110-Therapeutic exercises, 97530- Therapeutic activity, 97112- Neuromuscular re-education, 97535- Self Care, [id number]- Manual therapy, [phone]- Gait training, Balance training, Stair training, Visual/preceptual remediation/compensation, Cognitive remediation, DME instructions, Wheelchair mobility training, Cryotherapy, and Moist heat  PLAN FOR NEXT SESSION:  *** - supine bridging and L hip strengthening for improved stance control - sit>stand training with Blaze Pod sequence to promote gradual anterior  lean/weight shift while rising to standing - 10 meter walk test when appropriate (currently requires min/mod A)  - gait training using LRAD  - continue side stepping and backwards stepping (in // bars?) over targets  - incorporate L UE into activities - dual-cognitive tasks with above interventions  Overall: Activity tolerance, standing tolerance and balance, gait training. Transfer training (sits before turning fully).    Ginny Forth, PT, DPT, NCS, CSRS Physical Therapist - [place]  [facility]  8:19 AM 12/14/23

## 2023-12-19 ENCOUNTER — Ambulatory Visit: Admitting: Occupational Therapy

## 2023-12-19 ENCOUNTER — Ambulatory Visit: Admitting: Physical Therapy

## 2023-12-20 ENCOUNTER — Encounter: Payer: Medicare Other | Admitting: Physical Therapy

## 2023-12-20 ENCOUNTER — Ambulatory Visit: Admitting: Occupational Therapy

## 2023-12-20 ENCOUNTER — Ambulatory Visit: Admitting: Physical Therapy

## 2023-12-20 DIAGNOSIS — R269 Unspecified abnormalities of gait and mobility: Secondary | ICD-10-CM

## 2023-12-20 DIAGNOSIS — R5381 Other malaise: Secondary | ICD-10-CM

## 2023-12-20 DIAGNOSIS — R278 Other lack of coordination: Secondary | ICD-10-CM

## 2023-12-20 DIAGNOSIS — R262 Difficulty in walking, not elsewhere classified: Secondary | ICD-10-CM

## 2023-12-20 DIAGNOSIS — M6281 Muscle weakness (generalized): Secondary | ICD-10-CM

## 2023-12-20 NOTE — Therapy (Signed)
 OUTPATIENT PHYSICAL THERAPY NEURO TREATMENT   Patient Name: Kerri [name] MRN: [id number] DOB:09/07/1931, 89 y.o., female Today's Date: 12/20/2023   PCP: Wilford Corner, PA-C  REFERRING PROVIDER: Wilford Corner, PA-C   END OF SESSION:   PT End of Session - 12/20/23 1451     Visit Number 7    Number of Visits 24    Date for PT Re-Evaluation 02/08/24    PT Start Time 1448    PT Stop Time 1531    PT Time Calculation (min) 43 min    Equipment Utilized During Treatment Gait belt    Activity Tolerance Patient tolerated treatment well;No increased pain    Behavior During Therapy WFL for tasks assessed/performed                  Past Medical History:  Diagnosis Date   Arthritis    Past Surgical History:  Procedure Laterality Date   INTRAMEDULLARY (IM) NAIL INTERTROCHANTERIC Left 09/06/2022   Procedure: INTRAMEDULLARY (IM) NAIL INTERTROCHANTERIC;  Surgeon: Signa Kell, MD;  Location: [facility] OR[facility];  Service: Orthopedics;  Laterality: Left;   PACEMAKER LEADLESS INSERTION N/A 01/07/2021   Procedure: PACEMAKER LEADLESS INSERTION;  Surgeon: Marcina Millard, MD;  Location: [facility];  Service: Cardiovascular;  Laterality: N/A;   PPM GENERATOR REMOVAL N/A 01/07/2021   Procedure: PPM GENERATOR REMOVAL;  Surgeon: Marcina Millard, MD;  Location: [facility];  Service: Cardiovascular;  Laterality: N/A;   Patient Active Problem List   Diagnosis Date Noted   Acute postoperative anemia due to expected blood loss 09/07/2022   Displaced intertrochanteric fracture of left femur, initial encounter for closed fracture (HCC) 09/05/2022   Orthostatic hypotension 07/12/2022   Diarrhea 07/10/2022   COVID-19 virus infection 07/08/2022   Electrolyte abnormality 07/08/2022   Malnutrition of moderate degree 07/08/2022   Acute delirium 07/08/2022   History of recurrent UTI (urinary tract infection) 01/30/2022   Constipation    Elevated brain  natriuretic peptide (BNP) level 10/15/2021   Generalized weakness 10/15/2021   Chronic kidney disease, stage 3b (HCC) 10/15/2021   Normal pressure hydrocephalus (HCC) 10/15/2021   Dementia without behavioral disturbance (HCC) 10/15/2021   Hypothyroidism 10/15/2021   Essential hypertension 10/15/2021   Overactive bladder 10/15/2021   Moderate mitral regurgitation 01/13/2021   Status post placement of cardiac pacemaker 01/13/2021   CHB (complete heart block) (HCC) 01/07/2021   Chronic systolic CHF (congestive heart failure) (HCC) 12/17/2020   Postmenopausal osteoporosis 08/26/2016   Mixed Alzheimer's and vascular dementia (HCC) 06/13/2014    ONSET DATE: getting worse over the last year   REFERRING DIAG:  Diagnosis  M51.369 (ICD-10-CM) - Degeneration of lumbar intervertebral disc    THERAPY DIAG:   Muscle weakness (generalized)  Debility  Difficulty in walking, not elsewhere classified  Abnormality of gait and mobility   Rationale for Evaluation and Treatment: Rehabilitation  SUBJECTIVE:  SUBJECTIVE STATEMENT:  Pt reports she is doing "good." Pt denies any pain. [name], caregiver, present throughout. [name] reports having difficulty motivating patient to be more active at home.  From Eval:  Hx given from care giver; [name] .  Hx of L femur fx roughly a year and half ago. Hx of lumbar stenosis and BLE weakness. States that they want PT to address pain and weakness to allow continued mobility as tolerated. Care giver reports that she well complain of pain/numbness with mobility, but more willing to attemept to mobilize if "its something she wants"  Will self propel in [facility] at home  Pt accompanied by:  care giver   PERTINENT HISTORY:   From recent Neurology visit from MD.  "1. Mixed dementia  (Alzheimer's disease + vascular dementia) with significant ventriculomegaly (with negative lumbar drain trial in 2015) in a patient with history of sepsis secondary to UTI treated with trimethoprim January 2023. Medication appears to help with UTI and improved mental status - reports good control with current medication - Continue Donepezil 10 mg by mouth once a day  - Continue Memantine 10 mg by mouth two times a day   - Continue keeping patient involved in daily activities, such as reading out loud, helping prepare meals, picking out clothes.  - Recommend patient does not live alone. Patient should have 24/7 supervision.   2. Right leg pain with inability to pick up feet (right>left - likely from ventriculomegaly + peripheral neuropathy) patient with history of lumbar disc disease with stenosis - reports improvement during recent trial of prednisone. Suspicious for lumbar Disc Disease in addition to decondition Reviewed PCP note: Ongoing leg pain with intermittent weakness. On exam she does have some swelling around the right knee. Unsure if she could have some severe arthritis catching as she walks. Will obtain x-ray of the right knee today. Trial of prednisone 20 mg once a day for 5 days. We did discuss that she has some degenerative lumbar disc disease with stenosis and likely playing a role in right leg weakness. She has a pacemaker and we will defer MRI of the lumbar spine. Keep follow-up with neurology and if they feel it is necessary they could possibly pursue nerve conduction study. Inform caretaker to help patient and stay by her side when walking.   - Patient says that her legs feel "heavy" and that she has difficulty initiating the first step when walking - consistent with magnetic gait in patient with hydrocephalus and brain atrophy - Previous (-) Lumbar Puncture trial at [facility] (01/09/14)."   PAIN:  Are you having pain? No  PRECAUTIONS: Fall  RED FLAGS: None   WEIGHT BEARING  RESTRICTIONS: No  FALLS: Has patient fallen in last 6 months? Yes. Number of falls 2-3 controlled falls with assist to floor.    LIVING ENVIRONMENT: Lives with:  caregiver 7 days a week. Alone for an hour or 2 a day, but only when alseep.   Lives in: House/apartment Stairs: Yes: Internal: 1 but ramp in place steps; none and External: 1 steps; none Has following equipment at home: Walker - 2 wheeled  PLOF: Requires assistive device for independence, Needs assistance with ADLs, Needs assistance with homemaking, Needs assistance with gait, and Needs assistance with transfers  PATIENT GOALS: get stronger so she can "go and do"   OBJECTIVE:  Note: Objective measures were completed at Evaluation unless otherwise noted.  DIAGNOSTIC FINDINGS:   EXAM: CT LUMBAR SPINE WITHOUT CONTRAST IMPRESSION: 1. Transitional anatomy. Adhering to  convention from the prior lumbar spine MRI, the last fully formed disc space is labeled L5-S1. 2. Unchanged chronic compression fractures of L4 and L5. No new fracture of the lumbar spine. 3. Unchanged lumbar spondylosis notable for moderate narrowing of the right lateral recess at L1-2 and L2-3, as well as mild spinal canal stenosis at L2-3 and L3-4.   COGNITION: Overall cognitive status: History of cognitive impairments - at baseline   SENSATION: WFL  COORDINATION: Decreased speed of movement. Decreased ability to follow instructions with MMT   EDEMA:  Mild BLE distal edema   MUSCLE TONE: WFL  MUSCLE LENGTH: Generalized tightness from sedentary   DTRs:  Not assessed   POSTURE: rounded shoulders, forward head, increased lumbar lordosis, and increased thoracic kyphosis  LOWER EXTREMITY ROM:     Unable formally assess due to    LOWER EXTREMITY MMT:    MMT Right Eval Left Eval  Difficult to assess due to cognitive impairments, grossly 3+/5 to 4/5 proximal to distal with functional movement    BED MOBILITY:  To be assessed at later  visit.   TRANSFERS: Assistive device utilized: Environmental consultant - 2 wheeled  Sit to stand: Mod A Stand to sit: Mod A Chair to chair: Mod A Floor:  unable to complete without total A per care giver   RAMP:  Level of Assistance: Mod A Assistive device utilized: Environmental consultant - 2 wheeled Ramp Comments: caregiver reports use of RW to step down in to home entry   CURB:  Level of Assistance:  not assessed at eval  Assistive device utilized:  rails or RW  Curb Comments: care giver reports use of RW   STAIRS: Level of Assistance:  not assessed at [employer] Technique: Step to Pattern with Bilateral Rails Number of Stairs:   Height of Stairs:   Comments: no use of stairs at home for >1.5 years   GAIT: Gait pattern: decreased step length- Right, decreased stance time- Left, decreased hip/knee flexion- Right, knee flexed in stance- Right, knee flexed in stance- Left, and poor foot clearance- Right Distance walked: 20 Assistive device utilized: Walker - 2 wheeled Level of assistance: Min A and Mod A Comments: R foot drag with TUG. Increased flexed knee with fatigue.   FUNCTIONAL TESTS:  5 times sit to stand: 54 Timed up and go (TUG): 2:24 with min-mod assost  10 meter walk test: TBD   PATIENT SURVEYS:  PsFS: 4.5.   Getting into and out of bed: 0  Standing erect with walking: 5 Walking to bathroom: 4                                                                                                                               TREATMENT DATE:   12/20/23  Therapy session focused on updating patient's HEP and discussing ways to motivate pt to be more active at home to help with carryover of therapeutic interventions. Provided pt and caregiver  education on how to prioritize HEP based on pt's activity tolerance in any given day. Therapist organized HEP to have priority exercises for each position (standing, supine, seated) listed first.  Offered suggestions to promote patient standing by  having activities of interest for her to engage with at a standing level (ex: picking where to go to each day with options written at standing eye level).   Pt arrives to session in transport chair. Unless otherwise stated, min A was provided and gait belt donned in order to ensure pt safety throughout.  R stand pivot transport chair>EOM using RW with heavy min/light mod A for balance - cuing to remain upright and to visual/verbal cuing to improve motor planning turning as pt tries to just pivot hips rather than stepping to complete transfer.   Sit<>supine on mat via logroll technique with heavy min A for trunk and B LE management with mod cuing for sequencing - caregiver confirms assisting pt via this technique at home with inflatable bed that can be put on "firm" setting to improve pt's independence with this and decrease caregiver burden.   Performed the following supine exercises: - bridge 2x10reps - modified "leg press" against level 3 green theraband resistance x5-10 reps per LE (pt reports fatigue after 5 reps on L LE)  Therapist demonstrating to caregiver how to set-up and assist pt with these exercises at home using firm setting on inflatable mattress.   Sitting EOM today pt demos persistent LEFT posterior lean requiring close SBA to min A to remain upright.  Repeated sit<>stand EOM<>RW with mod A for lifting to stand today - attempted to cue pt to push-up from seat but poor ability to motor plan how to use her hands to assist with rising to stand - continues to lack adequate anterior trunk lean - x4reps.   Gait training ~15ft using RW with skilled mod A and +2 w/c follow. Pt demos: - whole body rotated towards R throughout gait - continued significant crouched posture with trunk/hip/knee flexion, worsening with fatigue - downward gaze - poor R LE foot clearance, impaired L foot clearance but not as severe - decreased step lengths bilaterally  - very slow gait speed with frequent  pauses in double limb support - slight manual facilitation for weight shifting onto stance limbs to allow increased contralateral step length - pt with heavy reliance on B UE support on AD *Pt with increased fatigue today despite caregiver reporting pt rested today, but pt had an active day yesterday    PATIENT EDUCATION: Education details: POC. HEP to improve generalized strength and activity tolerance  Person educated: Patient and Caregiver   Education method: Medical illustrator Education comprehension: verbalized understanding  HOME EXERCISE PROGRAM:  Access Code: [email or web address] URL: [email or web address] Date: 12/20/2023 Prepared by: Casimiro Needle  Exercises - Sit to Stand with Armchair  - 1 x daily - 7 x weekly - 3 sets - 5 reps - Standing March with Counter Support  - 1 x daily - 7 x weekly - 2 sets - 10 reps - Supine Bridge  - 1 x daily - 7 x weekly - 2 sets - 10 reps - Supine Heel Slide with Strap  - 1 x daily - 7 x weekly - 2 sets - 10 reps - Supine Hip Abduction AROM  - 1 x daily - 7 x weekly - 3 sets - 10 reps - Seated Long Arc Quad  - 1 x daily - 7 x weekly - 3 sets - 10  reps - Seated March  - 1 x daily - 7 x weekly - 3 sets - 10 reps - 2 hold - Seated Hip Abduction with Resistance  - 1 x daily - 7 x weekly - 2 sets - 10 reps    GOALS: Goals reviewed with patient? Yes   SHORT TERM GOALS: Target date: 12/15/2023   Patient will be independent in home exercise program to improve strength/mobility for better functional independence with ADLs. Baseline: provided on 11/16/23 12/20/2023: Updated  Goal status: INITIAL   LONG TERM GOALS: Target date: 02/09/2024    Patient will increase PSFS score to equal to or greater than and average of 2 points   to demonstrate statistically significant improvement in mobility and quality of life.  Baseline: initial eval: 4.5 Goal status: INITIAL  2.  Patient (> 32 years old) will complete five times sit to stand  test in < 30 seconds indicating an increased LE strength and improved balance. Baseline: 54 sec with UE support and RW  Goal status: INITIAL   3.  Patient will increase 10 meter walk test by 0.54m/s as to improve gait speed for better community ambulation and to reduce fall risk. Baseline: 12/05/23: pt currently requires min physical assistance and is now able to walk far enough to complete this test at next visit Goal status: INITIAL  4.  Patient will reduce timed up and go to <60 sec seconds to reduce fall risk and demonstrate improved transfer/gait ability. Baseline: 2:24 min with RW and min-mod assist  Goal status: INITIAL     ASSESSMENT:  CLINICAL IMPRESSION:  Ms. Cuddeback is a 89 y.o. female who was seen today for physical therapy treatment for debility secondary to strength and sensory changes from degeneration of lumbar discs, LLE pain, R hip pain and difficulty walking. Patient continues to demonstrate significant functional strength deficits requiring min/mod A for transfers and gait training. She also has difficulty with dual-task challenges due to cognitive decline from dementia. Pt with no significant R posterolateral trunk lean sitting EOM today; however, demos L posterior trunk lean. Therapy session focused on providing pt and caregiver an updated HEP to promote increased activity at home to improve carryover of therapeutic interventions. Discussed ideas to engage pt more with standing activities that pt is interested in at home. Performed supine exercises to ensure pt can appropriately perform them at home. Pt continues to participate in gait training up to 60ft today using RW with pt more crouched today due to increased fatigue. She will continue to benefit from high-intensity exercise training for promotion of overall neural recovery and increased endurance. Pt will benefit from continued skilled PT to address strength and balance impairments, to improve safety with bed mobility,  transfers, gait and allow improvement on overall QoL and reduce care giver burden.     OBJECTIVE IMPAIRMENTS: Abnormal gait, decreased activity tolerance, decreased balance, decreased cognition, decreased coordination, decreased endurance, decreased knowledge of condition, decreased mobility, difficulty walking, decreased ROM, decreased strength, decreased safety awareness, hypomobility, impaired flexibility, impaired vision/preception, improper body mechanics, postural dysfunction, and pain.   ACTIVITY LIMITATIONS: carrying, lifting, bending, standing, squatting, stairs, transfers, bed mobility, continence, bathing, toileting, dressing, hygiene/grooming, and locomotion level  PARTICIPATION LIMITATIONS: cleaning, laundry, shopping, and community activity  PERSONAL FACTORS: Age, Behavior pattern, Fitness, and 3+ comorbidities: HTN, L femur fx, dimentia  are also affecting patient's functional outcome.   REHAB POTENTIAL: Good  CLINICAL DECISION MAKING: Evolving/moderate complexity  EVALUATION COMPLEXITY: Moderate  PLAN:  PT FREQUENCY: 1-2x/week  PT DURATION: 12 weeks  PLANNED INTERVENTIONS: [id number]- PT Re-evaluation, 97110-Therapeutic exercises, 97530- Therapeutic activity, 97112- Neuromuscular re-education, 97535- Self Care, [id number]- Manual therapy, [phone]- Gait training, Balance training, Stair training, Visual/preceptual remediation/compensation, Cognitive remediation, DME instructions, Wheelchair mobility training, Cryotherapy, and Moist heat  PLAN FOR NEXT SESSION:  - follow-up on HEP - block practice stand pivot transfer training - L hip strengthening for improved stance control - sit>stand training with Blaze Pod sequence to promote gradual anterior lean/weight shift while rising to standing - continue side stepping and backwards stepping (in // bars?) over targets  - gait training using LRAD  - 10 meter walk test when appropriate (currently requires min/mod A)  - incorporate L UE  into activities - dual-cognitive tasks with above interventions  Overall: Activity tolerance, standing tolerance and balance, gait training. Transfer training (sits before turning fully).    Ginny Forth, PT, DPT, NCS, CSRS Physical Therapist - [place]  [facility]  3:58 PM 12/20/23

## 2023-12-20 NOTE — Therapy (Addendum)
 OUTPATIENT OCCUPATIONAL THERAPY NEURO TREATMENT NOTE  Patient Name: Kerri [name] MRN: [id number] DOB:Apr 06, 1931, 89 y.o., female Today's Date: 12/20/2023  PCP: Wilford Corner, PA-C REFERRING PROVIDER: Lonell Face, MD  END OF SESSION:  OT End of Session - 12/20/23 1630     Visit Number 2    Number of Visits 12    Date for OT Re-Evaluation 03/06/24    OT Start Time 1400    OT Stop Time 1445    OT Time Calculation (min) 45 min    Activity Tolerance Patient tolerated treatment well    Behavior During Therapy [facility] for tasks assessed/performed             Past Medical History:  Diagnosis Date   Arthritis    Past Surgical History:  Procedure Laterality Date   INTRAMEDULLARY (IM) NAIL INTERTROCHANTERIC Left 09/06/2022   Procedure: INTRAMEDULLARY (IM) NAIL INTERTROCHANTERIC;  Surgeon: Signa Kell, MD;  Location: [facility] ORS;  Service: Orthopedics;  Laterality: Left;   PACEMAKER LEADLESS INSERTION N/A 01/07/2021   Procedure: PACEMAKER LEADLESS INSERTION;  Surgeon: Marcina Millard, MD;  Location: [facility] INVASIVE CV LAB;  Service: Cardiovascular;  Laterality: N/A;   PPM GENERATOR REMOVAL N/A 01/07/2021   Procedure: PPM GENERATOR REMOVAL;  Surgeon: Marcina Millard, MD;  Location: [facility] INVASIVE CV LAB;  Service: Cardiovascular;  Laterality: N/A;   Patient Active Problem List   Diagnosis Date Noted   Acute postoperative anemia due to expected blood loss 09/07/2022   Displaced intertrochanteric fracture of left femur, initial encounter for closed fracture (HCC) 09/05/2022   Orthostatic hypotension 07/12/2022   Diarrhea 07/10/2022   COVID-19 virus infection 07/08/2022   Electrolyte abnormality 07/08/2022   Malnutrition of moderate degree 07/08/2022   Acute delirium 07/08/2022   History of recurrent UTI (urinary tract infection) 01/30/2022   Constipation    Elevated brain natriuretic peptide (BNP) level 10/15/2021   Generalized weakness 10/15/2021   Chronic  kidney disease, stage 3b (HCC) 10/15/2021   Normal pressure hydrocephalus (HCC) 10/15/2021   Dementia without behavioral disturbance (HCC) 10/15/2021   Hypothyroidism 10/15/2021   Essential hypertension 10/15/2021   Overactive bladder 10/15/2021   Moderate mitral regurgitation 01/13/2021   Status post placement of cardiac pacemaker 01/13/2021   CHB (complete heart block) (HCC) 01/07/2021   Chronic systolic CHF (congestive heart failure) (HCC) 12/17/2020   Postmenopausal osteoporosis 08/26/2016   Mixed Alzheimer's and vascular dementia (HCC) 06/13/2014    ONSET DATE: 09/05/2022  REFERRING DIAG: Intertrochanteric Left Femur Fracture, Lumbar Disc Disease with stenosis  THERAPY DIAG:  Muscle weakness (generalized)  Other lack of coordination  Rationale for Evaluation and Treatment: Rehabilitation  SUBJECTIVE:   SUBJECTIVE STATEMENT: Pt. Reports doing well today Pt accompanied by:  [name]-Personal Caregiver  PERTINENT HISTORY: Pt. Is a 89 y.o. female who sustained an Intertrochanteric Left Femur Fracture, Lumbar Disc Disease with Stenosis, Chronic compression Fractures. 09/05/2022, PMHx includes: Mixed Dementia (Alzheimer's Disease, and Vascular Dementia), Pacemaker, Mild spinal canal stenosis L2-3 & L3-4, Hx of UTI.  PRECAUTIONS: Pacemaker (6-8 years)  WEIGHT BEARING RESTRICTIONS: No  PAIN:  Are you having pain? No  FALLS: Has patient fallen in last 6 months? No, 3 assisted sitdowns to the flloor- When Pt. feels like she is not able to walk any further.  LIVING ENVIRONMENT: Lives with: [name] Lives in: House Stairs:  One story with a basement; 2 steps to enter from the garage. In step down to the den; and and one step ramp into the kitchen Has following equipment at home:  Wheelchair (manual), walker, hospital bed with rails, and a lift chair, BCommode, and shower chair. Gait belt, Life Alert, hand held shower head.  PLOF: Independent  PATIENT GOALS:  To be able to stand, and  walk to the bathroom. Left  hand -Pt. Is not using her left hand much  OBJECTIVE:  Note: Objective measures were completed at Evaluation unless otherwise noted.  HAND DOMINANCE: Right  ADLs:  Transfers/ambulation related to ADLs: Eating: Independent, assist with set-up for cutting food Grooming: Independent set-up, seated  UB Dressing: Independent with set-up LB Dressing: Mod/MaxA pants, and socks Toileting: Assist with toilet transfers, clothing negotiation, Independent toilet hygiene. Some incontinence episodes Bathing: MaxA Tub Shower transfers: Max A shower transfers  IADLs: Shopping: Dependent. Will accompany at times. Light housekeeping: Occasionally wipes the table, and puts items in the trash, folds laundry when brought sitting Meal Prep: Will help assist with baking/mixing items, no cooking, meals provided for the Pt. Community mobility: Relies on family and friends Medication management: Medication management provided for the Pt. Financial management:  No change from baseline   MOBILITY STATUS: Hx of Falls, Needs assist   FUNCTIONAL OUTCOME MEASURES: TBD  UPPER EXTREMITY ROM:    Active ROM Right eval Left eval  Shoulder flexion 119(140) 112(130)  Shoulder abduction 112 98  Shoulder adduction    Shoulder extension    Shoulder internal rotation    Shoulder external rotation    Elbow flexion WNL WNL  Elbow extension WNL WNL  Wrist flexion    Wrist extension WNL WNL  Wrist ulnar deviation    Wrist radial deviation    Wrist pronation    Wrist supination    (Blank rows = not tested)  UPPER EXTREMITY MMT:     MMT Right eval Left eval  Shoulder flexion 3-/5 3-/5  Shoulder abduction 3-/5 3-/5  Shoulder adduction    Shoulder extension    Shoulder internal rotation    Shoulder external rotation    Middle trapezius    Lower trapezius    Elbow flexion 4/5 3+/5  Elbow extension 4/5 3+/5  Wrist flexion    Wrist extension 4/5 3+/5  Wrist ulnar  deviation    Wrist radial deviation    Wrist pronation    Wrist supination    (Blank rows = not tested)  HAND FUNCTION: Grip strength: Right: 29 lbs; Left: 21 lbs; Pinch strength: Lateral: R: 10#, L: 6#, 3pt. Pinch: R: 8#, L: 5#  COORDINATION: TBD 9 Hole Peg test: Right: 1 min. & 2  sec; Left: 2 min. & 1 sec  SENSATION: Light touch: Inconsistent  COGNITION: Overall cognitive status: History of cognitive impairments - at baseline Alzheimer's Dementia, and Vascular Dementia  VISION: Subjective report:  Glasses all the time   VISION ASSESSMENT: Wears glasses all the time at baseline  TREATMENT DATE: 12/20/2023  Therapeutic Ex.:   -Performed BUE strengthening using a 2# dowel ex. 2/2 to weakness. Bilateral shoulder flexion, chest press, circular patterns, and elbow flexion/extension for 1 set  10 reps each.  -Progressive gross grip strengthening with 11.2# of grip strength resistive force.   Therapeutic Activities:   Pt. worked on functional reaching with the LUE using the shape tower. Pt. Grasped and moved the shapes through 2/4 vertical dowels of progressively increasing heights.    PATIENT EDUCATION: Education details: LUE functioning Person educated: Patient and Caregiver [name] Education method: Explanation Education comprehension: needs further education  HOME EXERCISE PROGRAM: To be assessed, and provided as indicated   GOALS: Goals reviewed with patient? Yes  SHORT TERM GOALS: Target date: 01/24/2024    Pt. Will require Supervision for BUE HEPs Baseline: Eval: No current HEP Goal status: INITIAL   LONG TERM GOALS: Target date: 03/06/2024  Pt. Will increase BUE strength by 2 mm grades to assist with ADLs/IADLs. Baseline: Eval: Right: shoulder flexion 3-/5, abduction 3-/5, elbow flexion 4/5, extension 4/5, wrist extension 4/5; Left:  shoulder flexion: 3-/5, abduction: 3-/5, elbow flexion 3+/5, extension 3+/5, wrist extension 3+/5 Goal status: INITIAL  2.  Pt. Will improve left grip strength by 4 # to assist with hiking pants. Baseline: R: 29#, L: 21# Goal status: INITIAL  3.  Pt. Will initiate engaging the left hand  during daily activity 75% of the time with minimal cuing Baseline: Eval: Limited initiation , and engagement of the LUE during daily tasks.  Goal status: INITIAL  4.  Pt. Demonstrate compensatory adaptive techniques to assist with ADLs/IADLs. Baseline: Eval: Education to be provided Goal status: INITIAL  ASSESSMENT:  CLINICAL IMPRESSION:  Pt. required verbal, and tactile cues for the LUE during each of the exercises. Pt. required support proximally to the LUE during reaching tasks, and to sustain the grip strengthener in a vertical position. Pt. requires consistent cues to engage her LUE only during more tasks. Pt. continues to benefit from OT services to work on improving LUE functioning in order to improve, and maximize participation with ADLs, and IADL tasks.  PERFORMANCE DEFICITS: in functional skills including ADLs, IADLs, coordination, dexterity, proprioception, ROM, strength, Fine motor control, Gross motor control, mobility, decreased knowledge of use of DME, and UE functional use, cognitive skills including attention, problem solving, and safety awareness, and psychosocial skills including environmental adaptation and routines and behaviors.   IMPAIRMENTS: are limiting patient from ADLs, IADLs, education, and leisure.   CO-MORBIDITIES: has  other co-morbidities that affects occupational performance. Patient will benefit from skilled OT to address above impairments and improve overall function.  MODIFICATION OR ASSISTANCE TO COMPLETE EVALUATION: Min-Moderate modification of tasks or assist with assess necessary to complete an evaluation.  OT OCCUPATIONAL PROFILE AND HISTORY: Detailed assessment:  Review of records and additional review of physical, cognitive, psychosocial history related to current functional performance.  CLINICAL DECISION MAKING: Moderate - several treatment options, min-mod task modification necessary  REHAB POTENTIAL: Good  EVALUATION COMPLEXITY: Moderate    PLAN:  OT FREQUENCY: 1x/week  OT DURATION: 12 weeks  PLANNED INTERVENTIONS: 97168 OT Re-evaluation, 97535 self care/ADL training, 95621 therapeutic exercise, 97530 therapeutic activity, 97112 neuromuscular re-education, 97140 manual therapy, 97018 paraffin, 30865 moist heat, 97034 contrast bath, passive range of motion, functional mobility training, patient/family education, and DME and/or AE instructions  RECOMMENDED OTHER SERVICES: PT  CONSULTED AND AGREED WITH PLAN OF CARE: Patient and family member/caregiver  PLAN FOR NEXT SESSION: Treatment   Consuella Lose  Tyan Lasure, MS, OTR/L 12/20/2023, 4:32 PM

## 2023-12-21 ENCOUNTER — Encounter: Admitting: Occupational Therapy

## 2023-12-21 ENCOUNTER — Ambulatory Visit

## 2023-12-21 DIAGNOSIS — R262 Difficulty in walking, not elsewhere classified: Secondary | ICD-10-CM

## 2023-12-21 DIAGNOSIS — R278 Other lack of coordination: Secondary | ICD-10-CM

## 2023-12-21 DIAGNOSIS — M6281 Muscle weakness (generalized): Secondary | ICD-10-CM

## 2023-12-21 DIAGNOSIS — R5381 Other malaise: Secondary | ICD-10-CM | POA: Diagnosis not present

## 2023-12-21 DIAGNOSIS — R269 Unspecified abnormalities of gait and mobility: Secondary | ICD-10-CM

## 2023-12-21 NOTE — Therapy (Signed)
 OUTPATIENT PHYSICAL THERAPY NEURO TREATMENT   Patient Name: Kerri [name] MRN: [id number] DOB:[birth year], 89 y.o., female Today's Date: 12/22/2023   PCP: Wilford Corner, PA-C  REFERRING PROVIDER: Wilford Corner, PA-C   END OF SESSION:   PT End of Session - 12/21/23 1330     Visit Number 8    Number of Visits 24    Date for PT Re-Evaluation 02/08/24    Progress Note Due on Visit 10    PT Start Time 1330    PT Stop Time 1410    PT Time Calculation (min) 40 min    Equipment Utilized During Treatment Gait belt    Activity Tolerance Patient tolerated treatment well;No increased pain    Behavior During Therapy WFL for tasks assessed/performed                  Past Medical History:  Diagnosis Date   Arthritis    Past Surgical History:  Procedure Laterality Date   INTRAMEDULLARY (IM) NAIL INTERTROCHANTERIC Left 09/06/2022   Procedure: INTRAMEDULLARY (IM) NAIL INTERTROCHANTERIC;  Surgeon: Signa Kell, MD;  Location: [facility] ORS;  Service: Orthopedics;  Laterality: Left;   PACEMAKER LEADLESS INSERTION N/A 01/07/2021   Procedure: PACEMAKER LEADLESS INSERTION;  Surgeon: Marcina Millard, MD;  Location: [facility];  Service: Cardiovascular;  Laterality: N/A;   PPM GENERATOR REMOVAL N/A 01/07/2021   Procedure: PPM GENERATOR REMOVAL;  Surgeon: Marcina Millard, MD;  Location: [facility];  Service: Cardiovascular;  Laterality: N/A;   Patient Active Problem List   Diagnosis Date Noted   Acute postoperative anemia due to expected blood loss 09/07/2022   Displaced intertrochanteric fracture of left femur, initial encounter for closed fracture (HCC) 09/05/2022   Orthostatic hypotension 07/12/2022   Diarrhea 07/10/2022   COVID-19 virus infection 07/08/2022   Electrolyte abnormality 07/08/2022   Malnutrition of moderate degree 07/08/2022   Acute delirium 07/08/2022   History of recurrent UTI (urinary tract infection) 01/30/2022    Constipation    Elevated brain natriuretic peptide (BNP) level 10/15/2021   Generalized weakness 10/15/2021   Chronic kidney disease, stage 3b (HCC) 10/15/2021   Normal pressure hydrocephalus (HCC) 10/15/2021   Dementia without behavioral disturbance (HCC) 10/15/2021   Hypothyroidism 10/15/2021   Essential hypertension 10/15/2021   Overactive bladder 10/15/2021   Moderate mitral regurgitation 01/13/2021   Status post placement of cardiac pacemaker 01/13/2021   CHB (complete heart block) (HCC) 01/07/2021   Chronic systolic CHF (congestive heart failure) (HCC) 12/17/2020   Postmenopausal osteoporosis 08/26/2016   Mixed Alzheimer's and vascular dementia (HCC) 06/13/2014    ONSET DATE: getting worse over the last year   REFERRING DIAG:  Diagnosis  M51.369 (ICD-10-CM) - Degeneration of lumbar intervertebral disc    THERAPY DIAG:   Muscle weakness (generalized)  Debility  Difficulty in walking, not elsewhere classified  Abnormality of gait and mobility  Other lack of coordination   Rationale for Evaluation and Treatment: Rehabilitation  SUBJECTIVE:  SUBJECTIVE STATEMENT:  Pt reports feeling some fatigue- agreeable to treatment stating "I will do what I can."   From Eval:  Hx given from care giver; [name] .  Hx of L femur fx roughly a year and half ago. Hx of lumbar stenosis and BLE weakness. States that they want PT to address pain and weakness to allow continued mobility as tolerated. Care giver reports that she well complain of pain/numbness with mobility, but more willing to attemept to mobilize if "its something she wants"  Will self propel in [facility] at home  Pt accompanied by:  care giver   PERTINENT HISTORY:   From recent Neurology visit from MD.  "1. Mixed dementia (Alzheimer's disease +  vascular dementia) with significant ventriculomegaly (with negative lumbar drain trial in 2015) in a patient with history of sepsis secondary to UTI treated with trimethoprim January 2023. Medication appears to help with UTI and improved mental status - reports good control with current medication - Continue Donepezil 10 mg by mouth once a day  - Continue Memantine 10 mg by mouth two times a day   - Continue keeping patient involved in daily activities, such as reading out loud, helping prepare meals, picking out clothes.  - Recommend patient does not live alone. Patient should have 24/7 supervision.   2. Right leg pain with inability to pick up feet (right>left - likely from ventriculomegaly + peripheral neuropathy) patient with history of lumbar disc disease with stenosis - reports improvement during recent trial of prednisone. Suspicious for lumbar Disc Disease in addition to decondition Reviewed PCP note: Ongoing leg pain with intermittent weakness. On exam she does have some swelling around the right knee. Unsure if she could have some severe arthritis catching as she walks. Will obtain x-ray of the right knee today. Trial of prednisone 20 mg once a day for 5 days. We did discuss that she has some degenerative lumbar disc disease with stenosis and likely playing a role in right leg weakness. She has a pacemaker and we will defer MRI of the lumbar spine. Keep follow-up with neurology and if they feel it is necessary they could possibly pursue nerve conduction study. Inform caretaker to help patient and stay by her side when walking.   - Patient says that her legs feel "heavy" and that she has difficulty initiating the first step when walking - consistent with magnetic gait in patient with hydrocephalus and brain atrophy - Previous (-) Lumbar Puncture trial at [facility] (01/09/14)."   PAIN:  Are you having pain? No  PRECAUTIONS: Fall  RED FLAGS: None   WEIGHT BEARING RESTRICTIONS: No  FALLS:  Has patient fallen in last 6 months? Yes. Number of falls 2-3 controlled falls with assist to floor.    LIVING ENVIRONMENT: Lives with:  caregiver 7 days a week. Alone for an hour or 2 a day, but only when alseep.   Lives in: House/apartment Stairs: Yes: Internal: 1 but ramp in place steps; none and External: 1 steps; none Has following equipment at home: Walker - 2 wheeled  PLOF: Requires assistive device for independence, Needs assistance with ADLs, Needs assistance with homemaking, Needs assistance with gait, and Needs assistance with transfers  PATIENT GOALS: get stronger so she can "go and do"   OBJECTIVE:  Note: Objective measures were completed at Evaluation unless otherwise noted.  DIAGNOSTIC FINDINGS:   EXAM: CT LUMBAR SPINE WITHOUT CONTRAST IMPRESSION: 1. Transitional anatomy. Adhering to convention from the prior lumbar spine MRI, the last fully  formed disc space is labeled L5-S1. 2. Unchanged chronic compression fractures of L4 and L5. No new fracture of the lumbar spine. 3. Unchanged lumbar spondylosis notable for moderate narrowing of the right lateral recess at L1-2 and L2-3, as well as mild spinal canal stenosis at L2-3 and L3-4.   COGNITION: Overall cognitive status: History of cognitive impairments - at baseline   SENSATION: WFL  COORDINATION: Decreased speed of movement. Decreased ability to follow instructions with MMT   EDEMA:  Mild BLE distal edema   MUSCLE TONE: WFL  MUSCLE LENGTH: Generalized tightness from sedentary   DTRs:  Not assessed   POSTURE: rounded shoulders, forward head, increased lumbar lordosis, and increased thoracic kyphosis  LOWER EXTREMITY ROM:     Unable formally assess due to    LOWER EXTREMITY MMT:    MMT Right Eval Left Eval  Difficult to assess due to cognitive impairments, grossly 3+/5 to 4/5 proximal to distal with functional movement    BED MOBILITY:  To be assessed at later visit.   TRANSFERS: Assistive  device utilized: Environmental consultant - 2 wheeled  Sit to stand: Mod A Stand to sit: Mod A Chair to chair: Mod A Floor:  unable to complete without total A per care giver   RAMP:  Level of Assistance: Mod A Assistive device utilized: Environmental consultant - 2 wheeled Ramp Comments: caregiver reports use of RW to step down in to home entry   CURB:  Level of Assistance:  not assessed at eval  Assistive device utilized:  rails or RW  Curb Comments: care giver reports use of RW   STAIRS: Level of Assistance:  not assessed at [employer] Technique: Step to Pattern with Bilateral Rails Number of Stairs:   Height of Stairs:   Comments: no use of stairs at home for >1.5 years   GAIT: Gait pattern: decreased step length- Right, decreased stance time- Left, decreased hip/knee flexion- Right, knee flexed in stance- Right, knee flexed in stance- Left, and poor foot clearance- Right Distance walked: 20 Assistive device utilized: Walker - 2 wheeled Level of assistance: Min A and Mod A Comments: R foot drag with TUG. Increased flexed knee with fatigue.   FUNCTIONAL TESTS:  5 times sit to stand: 54 Timed up and go (TUG): 2:24 with min-mod assost  10 meter walk test: TBD   PATIENT SURVEYS:  PsFS: 4.5.   Getting into and out of bed: 0  Standing erect with walking: 5 Walking to bathroom: 4                                                                                                                               TREATMENT DATE:   12/21/2023  Pt arrives to session in transport chair. Unless otherwise stated, min A was provided and gait belt donned in order to ensure pt safety throughout.  Therapeutic Activities:   Sit to stand transfers -focusing on scooting, forward trunk weight  shift, and multimodal cues to look ahead  Seated hip flex with 2.5 # (for LE strength and coordination with step height with walking) - 2 x 10 reps Seated knee ext with 2.5# (for LE strength and coordination of movement) - 2  x 10 reps     Repeated sit<>stand EOM<>RW with mod A for lifting to stand today - attempted to cue pt to push-up from seat but poor ability to motor plan how to use her hands to assist with rising to stand - continues to lack adequate anterior trunk lean - x4reps.   Gait training ~18 ft (using 2.5# AW) and later 36 feet (no ankle weights)  using RW with skilled mod A and +2 w/c follow. Pt demos: - continued significant crouched posture with trunk/hip/knee flexion, worsening with fatigue - downward gaze with max VC to "look ahead"  - poor R LE foot clearance , impaired L foot clearance but not as severe - decreased step lengths bilaterally  - very slow gait speed with frequent pauses in double limb support - pt with heavy reliance on B UE support on AD    PATIENT EDUCATION: Education details: POC. HEP to improve generalized strength and activity tolerance  Person educated: Patient and Caregiver   Education method: Medical illustrator Education comprehension: verbalized understanding  HOME EXERCISE PROGRAM:  Access Code: [email or web address] URL: [email or web address] Date: 12/20/2023 Prepared by: Casimiro Needle  Exercises - Sit to Stand with Armchair  - 1 x daily - 7 x weekly - 3 sets - 5 reps - Standing March with Counter Support  - 1 x daily - 7 x weekly - 2 sets - 10 reps - Supine Bridge  - 1 x daily - 7 x weekly - 2 sets - 10 reps - Supine Heel Slide with Strap  - 1 x daily - 7 x weekly - 2 sets - 10 reps - Supine Hip Abduction AROM  - 1 x daily - 7 x weekly - 3 sets - 10 reps - Seated Long Arc Quad  - 1 x daily - 7 x weekly - 3 sets - 10 reps - Seated March  - 1 x daily - 7 x weekly - 3 sets - 10 reps - 2 hold - Seated Hip Abduction with Resistance  - 1 x daily - 7 x weekly - 2 sets - 10 reps    GOALS: Goals reviewed with patient? Yes   SHORT TERM GOALS: Target date: 12/15/2023   Patient will be independent in home exercise program to improve  strength/mobility for better functional independence with ADLs. Baseline: provided on 11/16/23 12/20/2023: Updated  Goal status: INITIAL   LONG TERM GOALS: Target date: 02/09/2024    Patient will increase PSFS score to equal to or greater than and average of 2 points   to demonstrate statistically significant improvement in mobility and quality of life.  Baseline: initial eval: 4.5 Goal status: INITIAL  2.  Patient (> 69 years old) will complete five times sit to stand test in < 30 seconds indicating an increased LE strength and improved balance. Baseline: 54 sec with UE support and RW  Goal status: INITIAL   3.  Patient will increase 10 meter walk test by 0.81m/s as to improve gait speed for better community ambulation and to reduce fall risk. Baseline: 12/05/23: pt currently requires min physical assistance and is now able to walk far enough to complete this test at next visit Goal status: INITIAL  4.  Patient will reduce  timed up and go to <60 sec seconds to reduce fall risk and demonstrate improved transfer/gait ability. Baseline: 2:24 min with RW and min-mod assist  Goal status: INITIAL     ASSESSMENT:  CLINICAL IMPRESSION:  Continued POC for physical therapy treatment for debility secondary to strength and sensory changes from degeneration of lumbar discs, LLE pain, R hip pain and difficulty walking. Patient continues to demonstrate significant LE weakness and decreased initiative with activities requiring Verbal prompting and encouragement to perform. She was able to increase her distance slightly today and responded well to addition of resistive walking stating "I forgot I had the weights on. Maybe that is why my legs are so tired." She will continue to benefit from high-intensity exercise training for promotion of overall neural recovery and increased endurance. Pt will benefit from continued skilled PT to address strength and balance impairments, to improve safety with bed  mobility, transfers, gait and allow improvement on overall QoL and reduce care giver burden.     OBJECTIVE IMPAIRMENTS: Abnormal gait, decreased activity tolerance, decreased balance, decreased cognition, decreased coordination, decreased endurance, decreased knowledge of condition, decreased mobility, difficulty walking, decreased ROM, decreased strength, decreased safety awareness, hypomobility, impaired flexibility, impaired vision/preception, improper body mechanics, postural dysfunction, and pain.   ACTIVITY LIMITATIONS: carrying, lifting, bending, standing, squatting, stairs, transfers, bed mobility, continence, bathing, toileting, dressing, hygiene/grooming, and locomotion level  PARTICIPATION LIMITATIONS: cleaning, laundry, shopping, and community activity  PERSONAL FACTORS: Age, Behavior pattern, Fitness, and 3+ comorbidities: HTN, L femur fx, dimentia  are also affecting patient's functional outcome.   REHAB POTENTIAL: Good  CLINICAL DECISION MAKING: Evolving/moderate complexity  EVALUATION COMPLEXITY: Moderate  PLAN:  PT FREQUENCY: 1-2x/week  PT DURATION: 12 weeks  PLANNED INTERVENTIONS: 97164- PT Re-evaluation, 97110-Therapeutic exercises, 97530- Therapeutic activity, 97112- Neuromuscular re-education, 97535- Self Care, [id number]- Manual therapy, [phone]- Gait training, Balance training, Stair training, Visual/preceptual remediation/compensation, Cognitive remediation, DME instructions, Wheelchair mobility training, Cryotherapy, and Moist heat  PLAN FOR NEXT SESSION:  - follow-up on HEP - block practice stand pivot transfer training - L hip strengthening for improved stance control - sit>stand training with Blaze Pod sequence to promote gradual anterior lean/weight shift while rising to standing - continue side stepping and backwards stepping (in // bars?) over targets  - gait training using LRAD  - 10 meter walk test when appropriate (currently requires min/mod A)  -  incorporate L UE into activities - dual-cognitive tasks with above interventions  Overall: Activity tolerance, standing tolerance and balance, gait training. Transfer training (sits before turning fully).    Lenda Kelp, PT Physical Therapist - [facility]  [facility]  12:15 PM 12/22/23

## 2023-12-22 ENCOUNTER — Encounter: Payer: Medicare Other | Admitting: Physical Therapy

## 2023-12-22 ENCOUNTER — Encounter: Payer: Medicare Other | Admitting: Occupational Therapy

## 2023-12-26 ENCOUNTER — Ambulatory Visit: Payer: Medicare Other | Admitting: Occupational Therapy

## 2023-12-26 ENCOUNTER — Ambulatory Visit: Payer: Medicare Other | Admitting: Physical Therapy

## 2023-12-26 DIAGNOSIS — M6281 Muscle weakness (generalized): Secondary | ICD-10-CM

## 2023-12-26 DIAGNOSIS — R269 Unspecified abnormalities of gait and mobility: Secondary | ICD-10-CM

## 2023-12-26 DIAGNOSIS — R278 Other lack of coordination: Secondary | ICD-10-CM

## 2023-12-26 DIAGNOSIS — R5381 Other malaise: Secondary | ICD-10-CM

## 2023-12-26 DIAGNOSIS — R262 Difficulty in walking, not elsewhere classified: Secondary | ICD-10-CM

## 2023-12-26 NOTE — Therapy (Signed)
 OUTPATIENT PHYSICAL THERAPY NEURO TREATMENT   Patient Name: Kerri [name] MRN: [id number] DOB:1931/04/18, 89 y.o., female Today's Date: 12/26/2023   PCP: Wilford Corner, PA-C  REFERRING PROVIDER: Wilford Corner, PA-C   END OF SESSION:   PT End of Session - 12/26/23 1534     Visit Number 9    Number of Visits 24    Date for PT Re-Evaluation 02/08/24    Progress Note Due on Visit 10    PT Start Time 1534    PT Stop Time 1614    PT Time Calculation (min) 40 min    Equipment Utilized During Treatment Gait belt    Activity Tolerance Patient tolerated treatment well;No increased pain    Behavior During Therapy WFL for tasks assessed/performed                   Past Medical History:  Diagnosis Date   Arthritis    Past Surgical History:  Procedure Laterality Date   INTRAMEDULLARY (IM) NAIL INTERTROCHANTERIC Left 09/06/2022   Procedure: INTRAMEDULLARY (IM) NAIL INTERTROCHANTERIC;  Surgeon: Signa Kell, MD;  Location: [facility];  Service: Orthopedics;  Laterality: Left;   PACEMAKER LEADLESS INSERTION N/A 01/07/2021   Procedure: PACEMAKER LEADLESS INSERTION;  Surgeon: Marcina Millard, MD;  Location: [facility];  Service: Cardiovascular;  Laterality: N/A;   PPM GENERATOR REMOVAL N/A 01/07/2021   Procedure: PPM GENERATOR REMOVAL;  Surgeon: Marcina Millard, MD;  Location: [facility];  Service: Cardiovascular;  Laterality: N/A;   Patient Active Problem List   Diagnosis Date Noted   Acute postoperative anemia due to expected blood loss 09/07/2022   Displaced intertrochanteric fracture of left femur, initial encounter for closed fracture (HCC) 09/05/2022   Orthostatic hypotension 07/12/2022   Diarrhea 07/10/2022   COVID-19 virus infection 07/08/2022   Electrolyte abnormality 07/08/2022   Malnutrition of moderate degree 07/08/2022   Acute delirium 07/08/2022   History of recurrent UTI (urinary tract infection) 01/30/2022    Constipation    Elevated brain natriuretic peptide (BNP) level 10/15/2021   Generalized weakness 10/15/2021   Chronic kidney disease, stage 3b (HCC) 10/15/2021   Normal pressure hydrocephalus (HCC) 10/15/2021   Dementia without behavioral disturbance (HCC) 10/15/2021   Hypothyroidism 10/15/2021   Essential hypertension 10/15/2021   Overactive bladder 10/15/2021   Moderate mitral regurgitation 01/13/2021   Status post placement of cardiac pacemaker 01/13/2021   CHB (complete heart block) (HCC) 01/07/2021   Chronic systolic CHF (congestive heart failure) (HCC) 12/17/2020   Postmenopausal osteoporosis 08/26/2016   Mixed Alzheimer's and vascular dementia (HCC) 06/13/2014    ONSET DATE: getting worse over the last year   REFERRING DIAG:  Diagnosis  M51.369 (ICD-10-CM) - Degeneration of lumbar intervertebral disc    THERAPY DIAG:   Muscle weakness (generalized)  Debility  Difficulty in walking, not elsewhere classified  Abnormality of gait and mobility  Other lack of coordination   Rationale for Evaluation and Treatment: Rehabilitation  SUBJECTIVE:  SUBJECTIVE STATEMENT:  Pt reports she is doing good. [name], caregiver, reports pt was very sedentary this weekend. States pt did some exercises getting into the bed, such as bridges but not consistent with all of her exercises.  Caregiver reports trying to help pt with recalling the need to stand every hour at home because caregiver reports pt states she only does the exercises at therapy because she has to.      From Eval:  Hx given from care giver; [name] .  Hx of L femur fx roughly a year and half ago. Hx of lumbar stenosis and BLE weakness. States that they want PT to address pain and weakness to allow continued mobility as tolerated. Care giver  reports that she well complain of pain/numbness with mobility, but more willing to attemept to mobilize if "its something she wants"  Will self propel in [facility] at home  Pt accompanied by:  care giver   PERTINENT HISTORY:   From recent Neurology visit from MD.  "1. Mixed dementia (Alzheimer's disease + vascular dementia) with significant ventriculomegaly (with negative lumbar drain trial in 2015) in a patient with history of sepsis secondary to UTI treated with trimethoprim January 2023. Medication appears to help with UTI and improved mental status - reports good control with current medication - Continue Donepezil 10 mg by mouth once a day  - Continue Memantine 10 mg by mouth two times a day   - Continue keeping patient involved in daily activities, such as reading out loud, helping prepare meals, picking out clothes.  - Recommend patient does not live alone. Patient should have 24/7 supervision.   2. Right leg pain with inability to pick up feet (right>left - likely from ventriculomegaly + peripheral neuropathy) patient with history of lumbar disc disease with stenosis - reports improvement during recent trial of prednisone. Suspicious for lumbar Disc Disease in addition to decondition Reviewed PCP note: Ongoing leg pain with intermittent weakness. On exam she does have some swelling around the right knee. Unsure if she could have some severe arthritis catching as she walks. Will obtain x-ray of the right knee today. Trial of prednisone 20 mg once a day for 5 days. We did discuss that she has some degenerative lumbar disc disease with stenosis and likely playing a role in right leg weakness. She has a pacemaker and we will defer MRI of the lumbar spine. Keep follow-up with neurology and if they feel it is necessary they could possibly pursue nerve conduction study. Inform caretaker to help patient and stay by her side when walking.   - Patient says that her legs feel "heavy" and that she has  difficulty initiating the first step when walking - consistent with magnetic gait in patient with hydrocephalus and brain atrophy - Previous (-) Lumbar Puncture trial at [facility] (01/09/14)."   PAIN:  Are you having pain? No  PRECAUTIONS: Fall  RED FLAGS: None   WEIGHT BEARING RESTRICTIONS: No  FALLS: Has patient fallen in last 6 months? Yes. Number of falls 2-3 controlled falls with assist to floor.    LIVING ENVIRONMENT: Lives with:  caregiver 7 days a week. Alone for an hour or 2 a day, but only when alseep.   Lives in: House/apartment Stairs: Yes: Internal: 1 but ramp in place steps; none and External: 1 steps; none Has following equipment at home: Walker - 2 wheeled  PLOF: Requires assistive device for independence, Needs assistance with ADLs, Needs assistance with homemaking, Needs assistance with gait, and  Needs assistance with transfers  PATIENT GOALS: get stronger so she can "go and do"   OBJECTIVE:  Note: Objective measures were completed at Evaluation unless otherwise noted.  DIAGNOSTIC FINDINGS:   EXAM: CT LUMBAR SPINE WITHOUT CONTRAST IMPRESSION: 1. Transitional anatomy. Adhering to convention from the prior lumbar spine MRI, the last fully formed disc space is labeled L5-S1. 2. Unchanged chronic compression fractures of L4 and L5. No new fracture of the lumbar spine. 3. Unchanged lumbar spondylosis notable for moderate narrowing of the right lateral recess at L1-2 and L2-3, as well as mild spinal canal stenosis at L2-3 and L3-4.   COGNITION: Overall cognitive status: History of cognitive impairments - at baseline   SENSATION: WFL  COORDINATION: Decreased speed of movement. Decreased ability to follow instructions with MMT   EDEMA:  Mild BLE distal edema   MUSCLE TONE: WFL  MUSCLE LENGTH: Generalized tightness from sedentary   DTRs:  Not assessed   POSTURE: rounded shoulders, forward head, increased lumbar lordosis, and increased thoracic  kyphosis  LOWER EXTREMITY ROM:     Unable formally assess due to    LOWER EXTREMITY MMT:    MMT Right Eval Left Eval  Difficult to assess due to cognitive impairments, grossly 3+/5 to 4/5 proximal to distal with functional movement    BED MOBILITY:  To be assessed at later visit.   TRANSFERS: Assistive device utilized: Environmental consultant - 2 wheeled  Sit to stand: Mod A Stand to sit: Mod A Chair to chair: Mod A Floor:  unable to complete without total A per care giver   RAMP:  Level of Assistance: Mod A Assistive device utilized: Environmental consultant - 2 wheeled Ramp Comments: caregiver reports use of RW to step down in to home entry   CURB:  Level of Assistance:  not assessed at eval  Assistive device utilized:  rails or RW  Curb Comments: care giver reports use of RW   STAIRS: Level of Assistance:  not assessed at [employer] Technique: Step to Pattern with Bilateral Rails Number of Stairs:   Height of Stairs:   Comments: no use of stairs at home for >1.5 years   GAIT: Gait pattern: decreased step length- Right, decreased stance time- Left, decreased hip/knee flexion- Right, knee flexed in stance- Right, knee flexed in stance- Left, and poor foot clearance- Right Distance walked: 20 Assistive device utilized: Walker - 2 wheeled Level of assistance: Min A and Mod A Comments: R foot drag with TUG. Increased flexed knee with fatigue.   FUNCTIONAL TESTS:  5 times sit to stand: 54 Timed up and go (TUG): 2:24 with min-mod assost  10 meter walk test: TBD   PATIENT SURVEYS:  PsFS: 4.5.   Getting into and out of bed: 0  Standing erect with walking: 5 Walking to bathroom: 4  TREATMENT DATE:   12/26/2023  Pt arrives to session in transport chair. Unless otherwise stated, min A was provided and gait belt donned in order to ensure pt safety  throughout.  Block practice stand pivot transfers transport chair<>EOM using RW with skilled heavy min A primarily for lifting to stand and then verbal cuing for external targets to improve sequencing of stepping fully when turning - therapits facilitating turning AD.  Sit>stand from partially elevated EOM to no UE support, but reaching for 2 Blaze Pods on mirror in front of her to promote anterior trunk lean and full upright standing - requires heavy skilled min to come to standing - pt lacks full hip/knee extension when standing upright, remaining in partial squat/crouch position - x12reps  Gait training 4ft x2 (seated break between) using RW with skilled min A for balance and AD management with +2 assist for transport chair follow for safety.  Pt demos the following gait deviations:  - continues to have gradually worsening crouched posture with fatigue, always has excessive hip flexion  - poor R LE foot clearance with L hip dropping during stance - reliance on B UE support on AD to remain upright - therapist assisting with AD management  - repeated cuing for increased step lengths bilaterally - providing motivational cuing throughout to increase gait distance   PATIENT EDUCATION: Education details: POC. HEP to improve generalized strength and activity tolerance  Person educated: Patient and Caregiver   Education method: Medical illustrator Education comprehension: verbalized understanding  HOME EXERCISE PROGRAM:  Access Code: [email or web address] URL: [email or web address] Date: 12/20/2023 Prepared by: Casimiro Needle  Exercises - Sit to Stand with Armchair  - 1 x daily - 7 x weekly - 3 sets - 5 reps - Standing March with Counter Support  - 1 x daily - 7 x weekly - 2 sets - 10 reps - Supine Bridge  - 1 x daily - 7 x weekly - 2 sets - 10 reps - Supine Heel Slide with Strap  - 1 x daily - 7 x weekly - 2 sets - 10 reps - Supine Hip Abduction AROM  - 1 x daily - 7 x weekly  - 3 sets - 10 reps - Seated Long Arc Quad  - 1 x daily - 7 x weekly - 3 sets - 10 reps - Seated March  - 1 x daily - 7 x weekly - 3 sets - 10 reps - 2 hold - Seated Hip Abduction with Resistance  - 1 x daily - 7 x weekly - 2 sets - 10 reps    GOALS: Goals reviewed with patient? Yes   SHORT TERM GOALS: Target date: 12/15/2023   Patient will be independent in home exercise program to improve strength/mobility for better functional independence with ADLs. Baseline: provided on 11/16/23 12/20/2023: Updated  Goal status: INITIAL   LONG TERM GOALS: Target date: 02/09/2024    Patient will increase PSFS score to equal to or greater than and average of 2 points   to demonstrate statistically significant improvement in mobility and quality of life.  Baseline: initial eval: 4.5 Goal status: INITIAL  2.  Patient (> 78 years old) will complete five times sit to stand test in < 30 seconds indicating an increased LE strength and improved balance. Baseline: 54 sec with UE support and RW  Goal status: INITIAL   3.  Patient will increase 10 meter walk test by 0.59m/s as to improve gait speed for better community ambulation and to reduce  fall risk. Baseline: 12/05/23: pt currently requires min physical assistance and is now able to walk far enough to complete this test at next visit Goal status: INITIAL  4.  Patient will reduce timed up and go to <60 sec seconds to reduce fall risk and demonstrate improved transfer/gait ability. Baseline: 2:24 min with RW and min-mod assist  Goal status: INITIAL     ASSESSMENT:  CLINICAL IMPRESSION:  Continued POC for physical therapy treatment for debility secondary to strength and sensory changes from degeneration of lumbar discs, LLE pain, R hip pain and difficulty walking. Patient continues to demonstrate significant LE weakness, impaired endurance, and decreased initiative with activities requiring verbal cuing and encouragement to perform. She progressed  with gait training to 39ft x2 during session today; however, continues to require skilled assistance and wheelchair follow for safety. Performed functional strengthening task of sit<>stands using an external target to promote increased powering up to stand. She will continue to benefit from high-intensity exercise training for promotion of overall neural recovery and increased endurance. Pt will benefit from continued skilled PT to address strength and balance impairments, to improve safety with bed mobility, transfers, gait and allow improvement on overall QoL and reduce care giver burden.     OBJECTIVE IMPAIRMENTS: Abnormal gait, decreased activity tolerance, decreased balance, decreased cognition, decreased coordination, decreased endurance, decreased knowledge of condition, decreased mobility, difficulty walking, decreased ROM, decreased strength, decreased safety awareness, hypomobility, impaired flexibility, impaired vision/preception, improper body mechanics, postural dysfunction, and pain.   ACTIVITY LIMITATIONS: carrying, lifting, bending, standing, squatting, stairs, transfers, bed mobility, continence, bathing, toileting, dressing, hygiene/grooming, and locomotion level  PARTICIPATION LIMITATIONS: cleaning, laundry, shopping, and community activity  PERSONAL FACTORS: Age, Behavior pattern, Fitness, and 3+ comorbidities: HTN, L femur fx, dimentia  are also affecting patient's functional outcome.   REHAB POTENTIAL: Good  CLINICAL DECISION MAKING: Evolving/moderate complexity  EVALUATION COMPLEXITY: Moderate  PLAN:  PT FREQUENCY: 1-2x/week  PT DURATION: 12 weeks  PLANNED INTERVENTIONS: 97164- PT Re-evaluation, 97110-Therapeutic exercises, 97530- Therapeutic activity, 97112- Neuromuscular re-education, 97535- Self Care, [id number]- Manual therapy, [phone]- Gait training, Balance training, Stair training, Visual/preceptual remediation/compensation, Cognitive remediation, DME instructions,  Wheelchair mobility training, Cryotherapy, and Moist heat  PLAN FOR NEXT SESSION:  - progress note - reassess standardized outcome measures and subjective questionnaire - follow-up on HEP - L hip strengthening for improved stance control - sit>stand training with Blaze Pod sequence to promote gradual anterior lean/weight shift while rising to standing - continue side stepping and backwards stepping (in // bars?) over targets  - gait training using RW  - 10 meter walk test when appropriate (currently requires min/mod A)  - incorporate L UE into activities - dual-cognitive tasks with above interventions  Overall: Activity tolerance, standing tolerance and balance, gait training. Transfer training (sits before turning fully).    Ginny Forth, PT Physical Therapist - [facility]  [facility]  4:16 PM 12/26/23

## 2023-12-26 NOTE — Therapy (Addendum)
 OUTPATIENT OCCUPATIONAL THERAPY NEURO TREATMENT NOTE  Patient Name: Kerri [name] MRN: [id number] DOB:1931/03/26, 89 y.o., female Today's Date: 12/26/2023  PCP: Wilford Corner, PA-C REFERRING PROVIDER: Lonell Face, MD  END OF SESSION:  OT End of Session - 12/26/23 1553     Visit Number 3    Number of Visits 12    Date for OT Re-Evaluation 03/06/24    OT Start Time 1445    OT Stop Time 1530    OT Time Calculation (min) 45 min    Activity Tolerance Patient tolerated treatment well    Behavior During Therapy [facility] for tasks assessed/performed             Past Medical History:  Diagnosis Date   Arthritis    Past Surgical History:  Procedure Laterality Date   INTRAMEDULLARY (IM) NAIL INTERTROCHANTERIC Left 09/06/2022   Procedure: INTRAMEDULLARY (IM) NAIL INTERTROCHANTERIC;  Surgeon: Signa Kell, MD;  Location: [facility] ORS;  Service: Orthopedics;  Laterality: Left;   PACEMAKER LEADLESS INSERTION N/A 01/07/2021   Procedure: PACEMAKER LEADLESS INSERTION;  Surgeon: Marcina Millard, MD;  Location: [facility] IN[facility]IVE [facility];  Service: Cardiovascular;  Laterality: N/A;   PPM GENERATOR REMOVAL N/A 01/07/2021   Procedure: PPM GENERATOR REMOVAL;  Surgeon: Marcina Millard, MD;  Location: [facility];  Service: Cardiovascular;  Laterality: N/A;   Patient Active Problem List   Diagnosis Date Noted   Acute postoperative anemia due to expected blood loss 09/07/2022   Displaced intertrochanteric fracture of left femur, initial encounter for closed fracture (HCC) 09/05/2022   Orthostatic hypotension 07/12/2022   Diarrhea 07/10/2022   COVID-19 virus infection 07/08/2022   Electrolyte abnormality 07/08/2022   Malnutrition of moderate degree 07/08/2022   Acute delirium 07/08/2022   History of recurrent UTI (urinary tract infection) 01/30/2022   Constipation    Elevated brain natriuretic peptide (BNP) level 10/15/2021   Generalized weakness 10/15/2021   Chronic  kidney disease, stage 3b (HCC) 10/15/2021   Normal pressure hydrocephalus (HCC) 10/15/2021   Dementia without behavioral disturbance (HCC) 10/15/2021   Hypothyroidism 10/15/2021   Essential hypertension 10/15/2021   Overactive bladder 10/15/2021   Moderate mitral regurgitation 01/13/2021   Status post placement of cardiac pacemaker 01/13/2021   CHB (complete heart block) (HCC) 01/07/2021   Chronic systolic CHF (congestive heart failure) (HCC) 12/17/2020   Postmenopausal osteoporosis 08/26/2016   Mixed Alzheimer's and vascular dementia (HCC) 06/13/2014    ONSET DATE: 09/05/2022  REFERRING DIAG: Intertrochanteric Left Femur Fracture, Lumbar Disc Disease with stenosis  THERAPY DIAG:  Muscle weakness (generalized)  Rationale for Evaluation and Treatment: Rehabilitation  SUBJECTIVE:   SUBJECTIVE STATEMENT: Pt. Reports doing well today Pt accompanied by:  [name]-Personal Caregiver  PERTINENT HISTORY: Pt. Is a 89 y.o. female who sustained an Intertrochanteric Left Femur Fracture, Lumbar Disc Disease with Stenosis, Chronic compression Fractures. 09/05/2022, PMHx includes: Mixed Dementia (Alzheimer's Disease, and Vascular Dementia), Pacemaker, Mild spinal canal stenosis L2-3 & L3-4, Hx of UTI.  PRECAUTIONS: Pacemaker (6-8 years)  WEIGHT BEARING RESTRICTIONS: No  PAIN:  Are you having pain? No  FALLS: Has patient fallen in last 6 months? No, 3 assisted sitdowns to the flloor- When Pt. feels like she is not able to walk any further.  LIVING ENVIRONMENT: Lives with: [name] Lives in: House Stairs:  One story with a basement; 2 steps to enter from the garage. In step down to the den; and and one step ramp into the kitchen Has following equipment at home: Wheelchair (manual), walker, hospital bed  with rails, and a lift chair, BCommode, and shower chair. Gait belt, Life Alert, hand held shower head.  PLOF: Independent  PATIENT GOALS:  To be able to stand, and walk to the bathroom. Left hand  -Pt. Is not using her left hand much  OBJECTIVE:  Note: Objective measures were completed at Evaluation unless otherwise noted.  HAND DOMINANCE: Right  ADLs:  Transfers/ambulation related to ADLs: Eating: Independent, assist with set-up for cutting food Grooming: Independent set-up, seated  UB Dressing: Independent with set-up LB Dressing: Mod/MaxA pants, and socks Toileting: Assist with toilet transfers, clothing negotiation, Independent toilet hygiene. Some incontinence episodes Bathing: MaxA Tub Shower transfers: Max A shower transfers  IADLs: Shopping: Dependent. Will accompany at times. Light housekeeping: Occasionally wipes the table, and puts items in the trash, folds laundry when brought sitting Meal Prep: Will help assist with baking/mixing items, no cooking, meals provided for the Pt. Community mobility: Relies on family and friends Medication management: Medication management provided for the Pt. Financial management:  No change from baseline   MOBILITY STATUS: Hx of Falls, Needs assist   FUNCTIONAL OUTCOME MEASURES: TBD  UPPER EXTREMITY ROM:    Active ROM Right eval Left eval  Shoulder flexion 119(140) 112(130)  Shoulder abduction 112 98  Shoulder adduction    Shoulder extension    Shoulder internal rotation    Shoulder external rotation    Elbow flexion WNL WNL  Elbow extension WNL WNL  Wrist flexion    Wrist extension WNL WNL  Wrist ulnar deviation    Wrist radial deviation    Wrist pronation    Wrist supination    (Blank rows = not tested)  UPPER EXTREMITY MMT:     MMT Right eval Left eval  Shoulder flexion 3-/5 3-/5  Shoulder abduction 3-/5 3-/5  Shoulder adduction    Shoulder extension    Shoulder internal rotation    Shoulder external rotation    Middle trapezius    Lower trapezius    Elbow flexion 4/5 3+/5  Elbow extension 4/5 3+/5  Wrist flexion    Wrist extension 4/5 3+/5  Wrist ulnar deviation    Wrist radial deviation     Wrist pronation    Wrist supination    (Blank rows = not tested)  HAND FUNCTION: Grip strength: Right: 29 lbs; Left: 21 lbs; Pinch strength: Lateral: R: 10#, L: 6#, 3pt. Pinch: R: 8#, L: 5#  COORDINATION: TBD 9 Hole Peg test: Right: 1 min. & 2  sec; Left: 2 min. & 1 sec  SENSATION: Light touch: Inconsistent  COGNITION: Overall cognitive status: History of cognitive impairments - at baseline Alzheimer's Dementia, and Vascular Dementia  VISION: Subjective report:  Glasses all the time   VISION ASSESSMENT: Wears glasses all the time at baseline                                                                                                                            TREATMENT  DATE: 12/26/2023  Therapeutic Ex.:   -Performed BUE strengthening using a 2# dowel ex. 2/2 to weakness. Bilateral shoulder flexion, chest press, circular patterns, and elbow flexion/extension for 1 set  10 reps each.  -Performed BUE strengthening using 1# hand weight for elbow flexion, and extension, forearm supination/pronation, and wrist flexion/extension.  Therapeutic Activities:   Yellow Theraputty hand strengthening exercises including: gross gripping, gross digit extension, lateral, and 3pt. pinch strengthening, thumb opposition, rolling circular spheres within the tips of the fingers, and manipulating putty within the tips of fingers to remove coins. Pt./caregiver were provided with a visual handout HEP through [employer] with a video access code.    PATIENT EDUCATION: Education details: LUE functioning Person educated: Patient and Caregiver [name] Education method: Explanation Education comprehension: needs further education  HOME EXERCISE PROGRAM: To be assessed, and provided as indicated   GOALS: Goals reviewed with patient? Yes  SHORT TERM GOALS: Target date: 01/24/2024    Pt. Will require Supervision for BUE HEPs Baseline: Eval: No current HEP Goal status: INITIAL   LONG TERM GOALS:  Target date: 03/06/2024  Pt. Will increase BUE strength by 2 mm grades to assist with ADLs/IADLs. Baseline: Eval: Right: shoulder flexion 3-/5, abduction 3-/5, elbow flexion 4/5, extension 4/5, wrist extension 4/5; Left: shoulder flexion: 3-/5, abduction: 3-/5, elbow flexion 3+/5, extension 3+/5, wrist extension 3+/5 Goal status: INITIAL  2.  Pt. Will improve left grip strength by 4 # to assist with hiking pants. Baseline: R: 29#, L: 21# Goal status: INITIAL  3.  Pt. Will initiate engaging the left hand  during daily activity 75% of the time with minimal cuing Baseline: Eval: Limited initiation , and engagement of the LUE during daily tasks.  Goal status: INITIAL  4.  Pt. Demonstrate compensatory adaptive techniques to assist with ADLs/IADLs. Baseline: Eval: Education to be provided Goal status: INITIAL  ASSESSMENT:  CLINICAL IMPRESSION:  Pt. tolerated the exercises well today. Pt. continues to require verbal, and tactile cues for the LUE during each of the exercises. Pt. requires  verbal cues, and cues for visual demonstration of proper technique for each of the yellow theraputty exercises.Pt. presented with difficulty formulating the movement pattern for effective gross digit extension with the theraputty. Pt. required consistent verbal, and tactile cues for the red theraband exercises. Pt. requires consistent cues to engage her LUE only during more tasks. The amount of resistance was adjusted for lateral pinch strength. Pt. continues to benefit from OT services to work on improving LUE functioning in order to improve, and maximize participation with ADLs, and IADL tasks.  PERFORMANCE DEFICITS: in functional skills including ADLs, IADLs, coordination, dexterity, proprioception, ROM, strength, Fine motor control, Gross motor control, mobility, decreased knowledge of use of DME, and UE functional use, cognitive skills including attention, problem solving, and safety awareness, and psychosocial  skills including environmental adaptation and routines and behaviors.   IMPAIRMENTS: are limiting patient from ADLs, IADLs, education, and leisure.   CO-MORBIDITIES: has  other co-morbidities that affects occupational performance. Patient will benefit from skilled OT to address above impairments and improve overall function.  MODIFICATION OR ASSISTANCE TO COMPLETE EVALUATION: Min-Moderate modification of tasks or assist with assess necessary to complete an evaluation.  OT OCCUPATIONAL PROFILE AND HISTORY: Detailed assessment: Review of records and additional review of physical, cognitive, psychosocial history related to current functional performance.  CLINICAL DECISION MAKING: Moderate - several treatment options, min-mod task modification necessary  REHAB POTENTIAL: Good  EVALUATION COMPLEXITY: Moderate    PLAN:  OT FREQUENCY:  1x/week  OT DURATION: 12 weeks  PLANNED INTERVENTIONS: 97168 OT Re-evaluation, 97535 self care/ADL training, 40981 therapeutic exercise, 97530 therapeutic activity, 97112 neuromuscular re-education, 97140 manual therapy, 97018 paraffin, 19147 moist heat, 97034 contrast bath, passive range of motion, functional mobility training, patient/family education, and DME and/or AE instructions  RECOMMENDED OTHER SERVICES: PT  CONSULTED AND AGREED WITH PLAN OF CARE: Patient and family member/caregiver  PLAN FOR NEXT SESSION: Treatment   Olegario Messier, MS, OTR/L 12/26/2023, 4:06 PM

## 2023-12-27 ENCOUNTER — Encounter: Payer: Medicare Other | Admitting: Physical Therapy

## 2023-12-28 ENCOUNTER — Ambulatory Visit: Payer: Medicare Other | Admitting: Physical Therapy

## 2023-12-28 DIAGNOSIS — R278 Other lack of coordination: Secondary | ICD-10-CM

## 2023-12-28 DIAGNOSIS — R262 Difficulty in walking, not elsewhere classified: Secondary | ICD-10-CM

## 2023-12-28 DIAGNOSIS — R5381 Other malaise: Secondary | ICD-10-CM

## 2023-12-28 DIAGNOSIS — R269 Unspecified abnormalities of gait and mobility: Secondary | ICD-10-CM

## 2023-12-28 DIAGNOSIS — M6281 Muscle weakness (generalized): Secondary | ICD-10-CM

## 2023-12-28 NOTE — Therapy (Signed)
 OUTPATIENT PHYSICAL THERAPY NEURO TREATMENT  Physical Therapy Progress Note   Dates of reporting period  11/16/2023   to   12/28/2023   Patient Name: Kerri [name] MRN: [id number] DOB:[birth year], 89 y.o., female Today's Date: 12/28/2023   PCP: Wilford Corner, PA-C  REFERRING PROVIDER: Wilford Corner, PA-C   END OF SESSION:   PT End of Session - 12/28/23 1445     Visit Number 10    Number of Visits 24    Date for PT Re-Evaluation 02/08/24    Progress Note Due on Visit 10    PT Start Time 1445    PT Stop Time 1535    PT Time Calculation (min) 50 min    Equipment Utilized During Treatment Gait belt    Activity Tolerance Patient tolerated treatment well;No increased pain    Behavior During Therapy WFL for tasks assessed/performed             Past Medical History:  Diagnosis Date   Arthritis    Past Surgical History:  Procedure Laterality Date   INTRAMEDULLARY (IM) NAIL INTERTROCHANTERIC Left 09/06/2022   Procedure: INTRAMEDULLARY (IM) NAIL INTERTROCHANTERIC;  Surgeon: Signa Kell, MD;  Location: [facility];  Service: Orthopedics;  Laterality: Left;   PACEMAKER LEADLESS INSERTION N/A 01/07/2021   Procedure: PACEMAKER LEADLESS INSERTION;  Surgeon: Marcina Millard, MD;  Location: [facility];  Service: Cardiovascular;  Laterality: N/A;   PPM GENERATOR REMOVAL N/A 01/07/2021   Procedure: PPM GENERATOR REMOVAL;  Surgeon: Marcina Millard, MD;  Location: [facility];  Service: Cardiovascular;  Laterality: N/A;   Patient Active Problem List   Diagnosis Date Noted   Acute postoperative anemia due to expected blood loss 09/07/2022   Displaced intertrochanteric fracture of left femur, initial encounter for closed fracture (HCC) 09/05/2022   Orthostatic hypotension 07/12/2022   Diarrhea 07/10/2022   COVID-19 virus infection 07/08/2022   Electrolyte abnormality 07/08/2022   Malnutrition of moderate degree 07/08/2022   Acute delirium  07/08/2022   History of recurrent UTI (urinary tract infection) 01/30/2022   Constipation    Elevated brain natriuretic peptide (BNP) level 10/15/2021   Generalized weakness 10/15/2021   Chronic kidney disease, stage 3b (HCC) 10/15/2021   Normal pressure hydrocephalus (HCC) 10/15/2021   Dementia without behavioral disturbance (HCC) 10/15/2021   Hypothyroidism 10/15/2021   Essential hypertension 10/15/2021   Overactive bladder 10/15/2021   Moderate mitral regurgitation 01/13/2021   Status post placement of cardiac pacemaker 01/13/2021   CHB (complete heart block) (HCC) 01/07/2021   Chronic systolic CHF (congestive heart failure) (HCC) 12/17/2020   Postmenopausal osteoporosis 08/26/2016   Mixed Alzheimer's and vascular dementia (HCC) 06/13/2014    ONSET DATE: getting worse over the last year   REFERRING DIAG:  Diagnosis  M51.369 (ICD-10-CM) - Degeneration of lumbar intervertebral disc    THERAPY DIAG:   Muscle weakness (generalized)  Debility  Difficulty in walking, not elsewhere classified  Abnormality of gait and mobility  Other lack of coordination   Rationale for Evaluation and Treatment: Rehabilitation  SUBJECTIVE:  SUBJECTIVE STATEMENT:  Pt reports she is doing good. States "I haven't fell." Pt's caregiver reports pt has been doing her supine exercises including: bridges and heel slides trying to use theraband resistance every day.  Pt's caregiver reports pt's goal is to stand 1x every hour, but pt does it at least every other hour. Continues to report some difficulty motivating pt to participate in activity at home.   From Eval:  Hx given from care giver; [name] .  Hx of L femur fx roughly a year and half ago. Hx of lumbar stenosis and BLE weakness. States that they want PT to  address pain and weakness to allow continued mobility as tolerated. Care giver reports that she well complain of pain/numbness with mobility, but more willing to attemept to mobilize if "its something she wants"  Will self propel in [facility] at home  Pt accompanied by:  care giver   PERTINENT HISTORY:   From recent Neurology visit from MD.  "1. Mixed dementia (Alzheimer's disease + vascular dementia) with significant ventriculomegaly (with negative lumbar drain trial in 2015) in a patient with history of sepsis secondary to UTI treated with trimethoprim January 2023. Medication appears to help with UTI and improved mental status - reports good control with current medication - Continue Donepezil 10 mg by mouth once a day  - Continue Memantine 10 mg by mouth two times a day   - Continue keeping patient involved in daily activities, such as reading out loud, helping prepare meals, picking out clothes.  - Recommend patient does not live alone. Patient should have 24/7 supervision.   2. Right leg pain with inability to pick up feet (right>left - likely from ventriculomegaly + peripheral neuropathy) patient with history of lumbar disc disease with stenosis - reports improvement during recent trial of prednisone. Suspicious for lumbar Disc Disease in addition to decondition Reviewed PCP note: Ongoing leg pain with intermittent weakness. On exam she does have some swelling around the right knee. Unsure if she could have some severe arthritis catching as she walks. Will obtain x-ray of the right knee today. Trial of prednisone 20 mg once a day for 5 days. We did discuss that she has some degenerative lumbar disc disease with stenosis and likely playing a role in right leg weakness. She has a pacemaker and we will defer MRI of the lumbar spine. Keep follow-up with neurology and if they feel it is necessary they could possibly pursue nerve conduction study. Inform caretaker to help patient and stay by her side when  walking.   - Patient says that her legs feel "heavy" and that she has difficulty initiating the first step when walking - consistent with magnetic gait in patient with hydrocephalus and brain atrophy - Previous (-) Lumbar Puncture trial at [facility] (01/09/14)."   PAIN:  Are you having pain? No  PRECAUTIONS: Fall  RED FLAGS: None   WEIGHT BEARING RESTRICTIONS: No  FALLS: Has patient fallen in last 6 months? Yes. Number of falls 2-3 controlled falls with assist to floor.    LIVING ENVIRONMENT: Lives with:  caregiver 7 days a week. Alone for an hour or 2 a day, but only when alseep.   Lives in: House/apartment Stairs: Yes: Internal: 1 but ramp in place steps; none and External: 1 steps; none Has following equipment at home: Walker - 2 wheeled  PLOF: Requires assistive device for independence, Needs assistance with ADLs, Needs assistance with homemaking, Needs assistance with gait, and Needs assistance with transfers  PATIENT GOALS: get stronger so she can "go and do"   OBJECTIVE:  Note: Objective measures were completed at Evaluation unless otherwise noted.  DIAGNOSTIC FINDINGS:   EXAM: CT LUMBAR SPINE WITHOUT CONTRAST IMPRESSION: 1. Transitional anatomy. Adhering to convention from the prior lumbar spine MRI, the last fully formed disc space is labeled L5-S1. 2. Unchanged chronic compression fractures of L4 and L5. No new fracture of the lumbar spine. 3. Unchanged lumbar spondylosis notable for moderate narrowing of the right lateral recess at L1-2 and L2-3, as well as mild spinal canal stenosis at L2-3 and L3-4.   COGNITION: Overall cognitive status: History of cognitive impairments - at baseline   SENSATION: WFL  COORDINATION: Decreased speed of movement. Decreased ability to follow instructions with MMT   EDEMA:  Mild BLE distal edema   MUSCLE TONE: WFL  MUSCLE LENGTH: Generalized tightness from sedentary   DTRs:  Not assessed   POSTURE: rounded shoulders,  forward head, increased lumbar lordosis, and increased thoracic kyphosis  LOWER EXTREMITY ROM:     Unable formally assess due to    LOWER EXTREMITY MMT:    MMT Right Eval Left Eval  Difficult to assess due to cognitive impairments, grossly 3+/5 to 4/5 proximal to distal with functional movement    BED MOBILITY:  To be assessed at later visit.   TRANSFERS: Assistive device utilized: Environmental consultant - 2 wheeled  Sit to stand: Mod A Stand to sit: Mod A Chair to chair: Mod A Floor:  unable to complete without total A per care giver   RAMP:  Level of Assistance: Mod A Assistive device utilized: Environmental consultant - 2 wheeled Ramp Comments: caregiver reports use of RW to step down in to home entry   CURB:  Level of Assistance:  not assessed at eval  Assistive device utilized:  rails or RW  Curb Comments: care giver reports use of RW   STAIRS: Level of Assistance:  not assessed at [employer] Technique: Step to Pattern with Bilateral Rails Number of Stairs:   Height of Stairs:   Comments: no use of stairs at home for >1.5 years   GAIT: Gait pattern: decreased step length- Right, decreased stance time- Left, decreased hip/knee flexion- Right, knee flexed in stance- Right, knee flexed in stance- Left, and poor foot clearance- Right Distance walked: 20 Assistive device utilized: Walker - 2 wheeled Level of assistance: Min A and Mod A Comments: R foot drag with TUG. Increased flexed knee with fatigue.   FUNCTIONAL TESTS:  5 times sit to stand: 54 Timed up and go (TUG): 2:24 with min-mod assost  10 meter walk test: TBD   PATIENT SURVEYS:  PsFS: 4.5.   Getting into and out of bed: 0  Standing erect with walking: 5 Walking to bathroom: 4  TREATMENT DATE:   12/28/2023  Pt arrives to session in transport chair. Unless otherwise stated, min A was provided  and gait belt donned in order to ensure pt safety throughout.  PSFS - rated by caregiver due to pt's cognitive/memory deficits: Getting into and out of bed: 0 Standing erect with walking: 5 Walking to bathroom: slight improvement to 5 *Caregiver reports very minimal improvement noted thus far.  Therapist educated that based on patient's age, CLOF, and cognitive impairments that improvement will be more gradual.  Five times Sit to Stand Test (FTSS) "Stand up and sit down as quickly as possible 5 times, keeping your arms folded across your chest."    1st trial: 55.67 seconds from pt's personal transport chair and using RW in front of her - requires mod A to come to stand  2nd trial: 42.46 seconds as just described, pt tends to perform better after a warm-up and after exposure to task to help improve understanding  Times > 13.6 seconds is associated with increased disability and morbidity (Guralnik, 2000) Times > 15 seconds is predictive of recurrent falls in healthy individuals aged 34 and older (Buatois, et al., 2008) Normal performance values in community dwelling individuals aged 64 and older (Bohannon, 2006): 60-69 years: 11.4 seconds 70-79 years: 12.6 seconds 80-89 years: 14.8 seconds  MCID: >= 2.3 seconds for Vestibular Disorders (Meretta, 2006)   Participated in Timed Up and Go (TUG) using RW and skilled heavy min A/light mod A for balance and AD management with skilled cuing: 1st trial: 2 min 15 seconds requires cuing for turning and AD management as well as cuing for improved R LE step length 2nd trial: 2 min 24 seconds as just described Patient demonstrates high fall risk as indicated by requiring >13.5seconds to complete the TUG.    10 Meter Walk Test: Patient instructed to walk 10 meters (32.8 ft) as quickly and as safely as possible at their normal speed Results: 0.08 m/s ( and 2 seconds) using RW with skilled heavy min/mod A for balance  Cut off scores:    Household Ambulator  < 0.4 m/s  Limited Community Ambulator  0.4 - 0.8 m/s  [employer]  > 0.8 m/s  Increased fall risk  < 1.1m/s  Crossing a Street  >1.40m/s  MCID 0.05 m/s (small), 0.13 m/s (moderate), 0.06 m/s (significant)  (ANPTA Core Set of Outcome Measures for Adults with Neurologic Conditions, 2018)    Stand pivot transport chair<>EOM using RW with skilled heavy min A (primarily assist to lift into standing) - skilled cuing for stepping to turn fully prior to initiating sitting with manual facilitation for AD management. Discussed placing colored tape on RW legs to provide external targets for pt to step towards during transfers at home to decrease pt's tendency to initiate sitting too soon. Pt does well waiting for therapist to say when it is safe to sit during these transfers compared to transfers at end of TUG assessment.  Block practice supine<>sit transfers via logroll technique with education to pt's caregiver on how to provide pt with external targets/cues to improve pt's independence with this task (keeping into consideration when pt is performing these at home she is doing it at times when she is more fatigued). Providing cues for forward trunk flexion immediately upon sitting up ("reach down and touch her socks"). Providing target of "bedrail" with L hand to keep trunk forward when transitioning sit>R sidelying to prevent pt from starting to lay on her back too soon.  Pt requires skilled heavy min A with bed mobility on a mat (likely requires more assist on compliance bed surface)  Reinforced education to pt throughout session on recommendation to stand every hour, walk more when caregiver feels it is safe, and perform assigned HEP daily to help with pt's carryover of therapeutic interventions. Also, to help promote pt being more compliant at home when her caregiver asks her to perform these tasks.    PATIENT EDUCATION: Education details: POC. HEP to improve generalized  strength and activity tolerance  Person educated: Patient and Caregiver   Education method: Medical illustrator Education comprehension: verbalized understanding  HOME EXERCISE PROGRAM:  Access Code: [email or web address] URL: [email or web address] Date: 12/20/2023 Prepared by: Casimiro Needle  Exercises - Sit to Stand with Armchair  - 1 x daily - 7 x weekly - 3 sets - 5 reps - Standing March with Counter Support  - 1 x daily - 7 x weekly - 2 sets - 10 reps - Supine Bridge  - 1 x daily - 7 x weekly - 2 sets - 10 reps - Supine Heel Slide with Strap  - 1 x daily - 7 x weekly - 2 sets - 10 reps - Supine Hip Abduction AROM  - 1 x daily - 7 x weekly - 3 sets - 10 reps - Seated Long Arc Quad  - 1 x daily - 7 x weekly - 3 sets - 10 reps - Seated March  - 1 x daily - 7 x weekly - 3 sets - 10 reps - 2 hold - Seated Hip Abduction with Resistance  - 1 x daily - 7 x weekly - 2 sets - 10 reps    GOALS: Goals reviewed with patient? Yes   SHORT TERM GOALS: Target date: 12/15/2023   Patient will be independent in home exercise program to improve strength/mobility for better functional independence with ADLs. Baseline: provided on 11/16/23 12/20/2023: Updated  Goal status: IN PROGRESS   LONG TERM GOALS: Target date: 02/09/2024    Patient will increase PSFS score to equal to or greater than and average of 2 points   to demonstrate statistically significant improvement in mobility and quality of life.  Baseline: initial eval: 3.0 (corrected score) 12/28/2023: 3.33 Goal status: IN PROGRESS  2.  Patient (> 72 years old) will complete five times sit to stand test in < 30 seconds indicating an increased LE strength and improved balance. Baseline: 54 sec with UE support and RW  12/28/2023: 42.46 seconds with UE support and RW with min/mod A from pt's transport chair Goal status: IN PROGRESS   3.  Patient will increase 10 meter walk test by 0.57m/s as to improve gait speed for better  community ambulation and to reduce fall risk. Baseline: 12/05/23: pt currently requires min physical assistance and is now able to walk far enough to complete this test at next visit 12/28/2023: 0.08 m/s ( and 2 seconds) using RW with skilled heavy min/mod A for balance Goal status: IN PROGRESS  4.  Patient will reduce timed up and go to <60 sec seconds to reduce fall risk and demonstrate improved transfer/gait ability. Baseline: 2:24 min with RW and min-mod assist  12/28/2023: 73min15sec using RW with min/mod A Goal status: IN PROGRESS     ASSESSMENT:  CLINICAL IMPRESSION:  Patient is a 89y.o. female who was seen today for physical therapy treatment for impaired balance, significant generalized and B LE weakness, impaired endurance, impaired motor planning, impaired bed mobility, impaired transfers, and  gait deficits due to multiple comorbidities including Alzheimer's disease + vascular dementia. Therapy session focused on re-assessment of standardized outcome measures and subjective questionnaire to determine patient's progress with therapy thus far. Due to patient's age, cognitive impairments, and CLOF anticipate pt will demonstrate more gradual improvement with therapy. Patient continues to remain highly motivated to participate in therapy and therapist has discussed suggestions and ideas with caregiver to help improve pt's participation in HEP and increase mobility at home to improve carryover effects of therapeutic interventions. Pt demonstrates min improvement on 5xSTS, TUG, and is able to participate in full at this time. Overall, pt continues to require min/mod A for basic functional mobility tasks including sit<>stands, stand pivot transfers, and gait training using RW. Pt will benefit from continued skilled PT to address strength and balance impairments, to improve safety with bed mobility, transfers, gait and allow improvement on overall QoL and reduce care giver burden.  Patient's  condition has the potential to improve in response to therapy. Maximum improvement is yet to be obtained. The anticipated improvement is attainable and reasonable in a generally predictable time.      OBJECTIVE IMPAIRMENTS: Abnormal gait, decreased activity tolerance, decreased balance, decreased cognition, decreased coordination, decreased endurance, decreased knowledge of condition, decreased mobility, difficulty walking, decreased ROM, decreased strength, decreased safety awareness, hypomobility, impaired flexibility, impaired vision/preception, improper body mechanics, postural dysfunction, and pain.   ACTIVITY LIMITATIONS: carrying, lifting, bending, standing, squatting, stairs, transfers, bed mobility, continence, bathing, toileting, dressing, hygiene/grooming, and locomotion level  PARTICIPATION LIMITATIONS: cleaning, laundry, shopping, and community activity  PERSONAL FACTORS: Age, Behavior pattern, Fitness, and 3+ comorbidities: HTN, L femur fx, dimentia  are also affecting patient's functional outcome.   REHAB POTENTIAL: Good  CLINICAL DECISION MAKING: Evolving/moderate complexity  EVALUATION COMPLEXITY: Moderate  PLAN:  PT FREQUENCY: 1-2x/week  PT DURATION: 12 weeks  PLANNED INTERVENTIONS: [id number]- PT Re-evaluation, 97110-Therapeutic exercises, 97530- Therapeutic activity, 97112- Neuromuscular re-education, 97535- Self Care, [id number]- Manual therapy, [id number]- Gait training, Balance training, Stair training, Visual/preceptual remediation/compensation, Cognitive remediation, DME instructions, Wheelchair mobility training, Cryotherapy, and Moist heat  PLAN FOR NEXT SESSION:  - L hip strengthening for improved stance control - sit>stand training with Blaze Pod sequence to promote gradual anterior lean/weight shift while rising to standing - standing upright posture training with overhead reaching - continue side stepping and backwards stepping (in // bars?) over targets  - gait  training using RW, goal of achieving >110ft consistently - follow-up on HEP - incorporate L UE into activities - dual-cognitive tasks with above interventions  Overall: Activity tolerance, standing tolerance and balance, gait training. Transfer training (sits before turning fully).    Ginny Forth, PT Physical Therapist - [place]  [facility]  3:57 PM 12/28/23

## 2023-12-29 ENCOUNTER — Encounter: Payer: Medicare Other | Admitting: Physical Therapy

## 2024-01-02 ENCOUNTER — Ambulatory Visit: Payer: Medicare Other | Admitting: Physical Therapy

## 2024-01-02 ENCOUNTER — Ambulatory Visit: Admitting: Occupational Therapy

## 2024-01-02 DIAGNOSIS — M6281 Muscle weakness (generalized): Secondary | ICD-10-CM

## 2024-01-02 DIAGNOSIS — R278 Other lack of coordination: Secondary | ICD-10-CM

## 2024-01-02 DIAGNOSIS — R262 Difficulty in walking, not elsewhere classified: Secondary | ICD-10-CM

## 2024-01-02 DIAGNOSIS — R5381 Other malaise: Secondary | ICD-10-CM

## 2024-01-02 DIAGNOSIS — R269 Unspecified abnormalities of gait and mobility: Secondary | ICD-10-CM

## 2024-01-02 NOTE — Therapy (Signed)
 OUTPATIENT PHYSICAL THERAPY NEURO TREATMENT    Patient Name: Kerri [name] MRN: [id number] DOB:06-Apr-1931, 89 y.o., female Today's Date: 01/02/2024   PCP: Wilford Corner, PA-C  REFERRING PROVIDER: Wilford Corner, PA-C   END OF SESSION:   PT End of Session - 01/02/24 1536     Visit Number 11    Number of Visits 24    Date for PT Re-Evaluation 02/08/24    Progress Note Due on Visit 10    PT Start Time 1535    PT Stop Time 1626    PT Time Calculation (min) 51 min    Equipment Utilized During Treatment Gait belt    Activity Tolerance Patient tolerated treatment well;No increased pain    Behavior During Therapy WFL for tasks assessed/performed              Past Medical History:  Diagnosis Date   Arthritis    Past Surgical History:  Procedure Laterality Date   INTRAMEDULLARY (IM) NAIL INTERTROCHANTERIC Left 09/06/2022   Procedure: INTRAMEDULLARY (IM) NAIL INTERTROCHANTERIC;  Surgeon: Signa Kell, MD;  Location: [facility] ORS;  Service: Orthopedics;  Laterality: Left;   PACEMAKER LEADLESS INSERTION N/A 01/07/2021   Procedure: PACEMAKER LEADLESS INSERTION;  Surgeon: Marcina Millard, MD;  Location: [facility];  Service: Cardiovascular;  Laterality: N/A;   PPM GENERATOR REMOVAL N/A 01/07/2021   Procedure: PPM GENERATOR REMOVAL;  Surgeon: Marcina Millard, MD;  Location: [facility];  Service: Cardiovascular;  Laterality: N/A;   Patient Active Problem List   Diagnosis Date Noted   Acute postoperative anemia due to expected blood loss 09/07/2022   Displaced intertrochanteric fracture of left femur, initial encounter for closed fracture (HCC) 09/05/2022   Orthostatic hypotension 07/12/2022   Diarrhea 07/10/2022   COVID-19 virus infection 07/08/2022   Electrolyte abnormality 07/08/2022   Malnutrition of moderate degree 07/08/2022   Acute delirium 07/08/2022   History of recurrent UTI (urinary tract infection) 01/30/2022    Constipation    Elevated brain natriuretic peptide (BNP) level 10/15/2021   Generalized weakness 10/15/2021   Chronic kidney disease, stage 3b (HCC) 10/15/2021   Normal pressure hydrocephalus (HCC) 10/15/2021   Dementia without behavioral disturbance (HCC) 10/15/2021   Hypothyroidism 10/15/2021   Essential hypertension 10/15/2021   Overactive bladder 10/15/2021   Moderate mitral regurgitation 01/13/2021   Status post placement of cardiac pacemaker 01/13/2021   CHB (complete heart block) (HCC) 01/07/2021   Chronic systolic CHF (congestive heart failure) (HCC) 12/17/2020   Postmenopausal osteoporosis 08/26/2016   Mixed Alzheimer's and vascular dementia (HCC) 06/13/2014    ONSET DATE: getting worse over the last year   REFERRING DIAG:  Diagnosis  M51.369 (ICD-10-CM) - Degeneration of lumbar intervertebral disc    THERAPY DIAG:   Muscle weakness (generalized)  Debility  Difficulty in walking, not elsewhere classified  Abnormality of gait and mobility  Other lack of coordination   Rationale for Evaluation and Treatment: Rehabilitation  SUBJECTIVE:  SUBJECTIVE STATEMENT:  Pt reports she hasn't been standing every hour at home, but states she will try to do that. Pt reports she is doing good.   Caregiver reports when pt is going to lay down in the bed, pt freezes up when caregiver cues pt to lean to the side for reverse logroll technique and instead pt starts to lean backwards to go straight to supine making it more challenging to assist her with this. Caregiver reports it is awkward getting pt's legs up when going sit>supine, but it does help some when going supine>sit to use logroll technique.   From Eval:  Hx given from care giver; [name] .  Hx of L femur fx roughly a year and half ago. Hx of  lumbar stenosis and BLE weakness. States that they want PT to address pain and weakness to allow continued mobility as tolerated. Care giver reports that she well complain of pain/numbness with mobility, but more willing to attemept to mobilize if "its something she wants"  Will self propel in [facility] at home  Pt accompanied by:  care giver   PERTINENT HISTORY:   From recent Neurology visit from MD.  "1. Mixed dementia (Alzheimer's disease + vascular dementia) with significant ventriculomegaly (with negative lumbar drain trial in 2015) in a patient with history of sepsis secondary to UTI treated with trimethoprim January 2023. Medication appears to help with UTI and improved mental status - reports good control with current medication - Continue Donepezil 10 mg by mouth once a day  - Continue Memantine 10 mg by mouth two times a day   - Continue keeping patient involved in daily activities, such as reading out loud, helping prepare meals, picking out clothes.  - Recommend patient does not live alone. Patient should have 24/7 supervision.   2. Right leg pain with inability to pick up feet (right>left - likely from ventriculomegaly + peripheral neuropathy) patient with history of lumbar disc disease with stenosis - reports improvement during recent trial of prednisone. Suspicious for lumbar Disc Disease in addition to decondition Reviewed PCP note: Ongoing leg pain with intermittent weakness. On exam she does have some swelling around the right knee. Unsure if she could have some severe arthritis catching as she walks. Will obtain x-ray of the right knee today. Trial of prednisone 20 mg once a day for 5 days. We did discuss that she has some degenerative lumbar disc disease with stenosis and likely playing a role in right leg weakness. She has a pacemaker and we will defer MRI of the lumbar spine. Keep follow-up with neurology and if they feel it is necessary they could possibly pursue nerve conduction  study. Inform caretaker to help patient and stay by her side when walking.   - Patient says that her legs feel "heavy" and that she has difficulty initiating the first step when walking - consistent with magnetic gait in patient with hydrocephalus and brain atrophy - Previous (-) Lumbar Puncture trial at [facility] (01/09/14)."   PAIN:  Are you having pain? No  PRECAUTIONS: Fall  RED FLAGS: None   WEIGHT BEARING RESTRICTIONS: No  FALLS: Has patient fallen in last 6 months? Yes. Number of falls 2-3 controlled falls with assist to floor.    LIVING ENVIRONMENT: Lives with:  caregiver 7 days a week. Alone for an hour or 2 a day, but only when alseep.   Lives in: House/apartment Stairs: Yes: Internal: 1 but ramp in place steps; none and External: 1 steps; none Has  following equipment at home: Dan Humphreys - 2 wheeled  PLOF: Requires assistive device for independence, Needs assistance with ADLs, Needs assistance with homemaking, Needs assistance with gait, and Needs assistance with transfers  PATIENT GOALS: get stronger so she can "go and do"   OBJECTIVE:  Note: Objective measures were completed at Evaluation unless otherwise noted.  DIAGNOSTIC FINDINGS:   EXAM: CT LUMBAR SPINE WITHOUT CONTRAST IMPRESSION: 1. Transitional anatomy. Adhering to convention from the prior lumbar spine MRI, the last fully formed disc space is labeled L5-S1. 2. Unchanged chronic compression fractures of L4 and L5. No new fracture of the lumbar spine. 3. Unchanged lumbar spondylosis notable for moderate narrowing of the right lateral recess at L1-2 and L2-3, as well as mild spinal canal stenosis at L2-3 and L3-4.   COGNITION: Overall cognitive status: History of cognitive impairments - at baseline   SENSATION: WFL  COORDINATION: Decreased speed of movement. Decreased ability to follow instructions with MMT   EDEMA:  Mild BLE distal edema   MUSCLE TONE: WFL  MUSCLE LENGTH: Generalized tightness from  sedentary   DTRs:  Not assessed   POSTURE: rounded shoulders, forward head, increased lumbar lordosis, and increased thoracic kyphosis  LOWER EXTREMITY ROM:     Unable formally assess due to    LOWER EXTREMITY MMT:    MMT Right Eval Left Eval  Difficult to assess due to cognitive impairments, grossly 3+/5 to 4/5 proximal to distal with functional movement    BED MOBILITY:  To be assessed at later visit.   TRANSFERS: Assistive device utilized: Environmental consultant - 2 wheeled  Sit to stand: Mod A Stand to sit: Mod A Chair to chair: Mod A Floor:  unable to complete without total A per care giver   RAMP:  Level of Assistance: Mod A Assistive device utilized: Environmental consultant - 2 wheeled Ramp Comments: caregiver reports use of RW to step down in to home entry   CURB:  Level of Assistance:  not assessed at eval  Assistive device utilized:  rails or RW  Curb Comments: care giver reports use of RW   STAIRS: Level of Assistance:  not assessed at [employer] Technique: Step to Pattern with Bilateral Rails Number of Stairs:   Height of Stairs:   Comments: no use of stairs at home for >1.5 years   GAIT: Gait pattern: decreased step length- Right, decreased stance time- Left, decreased hip/knee flexion- Right, knee flexed in stance- Right, knee flexed in stance- Left, and poor foot clearance- Right Distance walked: 20 Assistive device utilized: Walker - 2 wheeled Level of assistance: Min A and Mod A Comments: R foot drag with TUG. Increased flexed knee with fatigue.   FUNCTIONAL TESTS:  5 times sit to stand: 54 Timed up and go (TUG): 2:24 with min-mod assost  10 meter walk test: TBD   PATIENT SURVEYS:  PsFS: 4.5.   Getting into and out of bed: 0  Standing erect with walking: 5 Walking to bathroom: 4  TREATMENT DATE:   01/02/2024  Pt arrives to session  in transport chair. Unless otherwise stated, min A was provided and gait belt donned in order to ensure pt safety throughout.  Sit>stand transport chair>RW with light min A today! And R stand pivot from transport chair>EOM using RW with light min A! And pt with improved ability to listen to therapist's cuing for external targets to step towards to ensure she turns fully and backs up to mat before initiating sitting.  *Caregiver reports at home she provides cue for pt to kick target on the side of the bed with her heels to provide auditory feedback to know she has stepped back fully before sitting - this is an excellent cue!  Focused on supine<>sit transfer training to improve pt's success and independence with this at home and decrease caregiver burden. Pt performed this with primarily min A but occasional light mod A with fatigue. Discussed benefits and challenges of using hospital bed:  - benefits: HOB elevation, full bed elevation, bedrails - challenges: small bed size making bed mobility difficult, set-up of bedrails don't allow pt to use them when sitting EOB   Found option of using the armrest of pt's transport chair as an external target for pt to reach her L hand towards when going from sitting>R sidelying on bed that will promote pt keeping anterior trunk lean for reverse logroll technique and avoid tendency for pt to start to lean posteriorly with poor mechanics.  Discussed potential benefit of assisting pt with scooting while she is still in R sidelying compared to trying to scoot her when in supine - all using bed pads.  Asked caregiver to take pictures of pt's set-up at home to further assist with planning techniques/cuing that would be successful at increasing pt's independence.  Performed repeated sit<>stands from EOM using RW with external target of Blaze Pod at top of mirror to promote fully upright stand - x10reps - question cuing to help pt recall to push-up from mat during each  stand, rather than pulling up on RW. Performs this with only light min A!  Standing tolerance task using RW support and 2 Blaze Pods at top corners of mirror to promote upright posture, set on 2 colors (pink = R hand, blue = L hand) for added cognitive dual-task - light min A for balance and pt stood for 3 minutes! - requires max cuing to recall correct hand to tap each color.   Gait training 81ft using RW with min A and +2 bringing chair follow - therapist continuing to provide min facilitation for weight shifting - max verbal/visual cuing for increased step lengths bilaterally with pt having poor foot clearance, small steps, and partial crouched posture throughout with excessive trunk/hip flexion.   Therapist educates caregiver on pt likely having hip flexor tightness due to prolonged seated positioning resulting in pt's difficulty with achieving full erect posture - educated on performing supine stretch at least 5 minutes each day and continuing to encourage pt to stand every hour.   PATIENT EDUCATION: Education details: POC. HEP to improve generalized strength and activity tolerance  Person educated: Patient and Caregiver   Education method: Medical illustrator Education comprehension: verbalized understanding  HOME EXERCISE PROGRAM:  Access Code: [email or web address] URL: [email or web address] Date: 12/20/2023 Prepared by: Casimiro Needle  Exercises - Sit to Stand with Armchair  - 1 x daily - 7 x weekly - 3 sets - 5 reps - Standing March with Counter Support  - 1 x daily -  7 x weekly - 2 sets - 10 reps - Supine Bridge  - 1 x daily - 7 x weekly - 2 sets - 10 reps - Supine Heel Slide with Strap  - 1 x daily - 7 x weekly - 2 sets - 10 reps - Supine Hip Abduction AROM  - 1 x daily - 7 x weekly - 3 sets - 10 reps - Seated Long Arc Quad  - 1 x daily - 7 x weekly - 3 sets - 10 reps - Seated March  - 1 x daily - 7 x weekly - 3 sets - 10 reps - 2 hold - Seated Hip Abduction with  Resistance  - 1 x daily - 7 x weekly - 2 sets - 10 reps    GOALS: Goals reviewed with patient? Yes   SHORT TERM GOALS: Target date: 12/15/2023   Patient will be independent in home exercise program to improve strength/mobility for better functional independence with ADLs. Baseline: provided on 11/16/23 12/20/2023: Updated  Goal status: IN PROGRESS   LONG TERM GOALS: Target date: 02/09/2024    Patient will increase PSFS score to equal to or greater than and average of 2 points   to demonstrate statistically significant improvement in mobility and quality of life.  Baseline: initial eval: 3.0 (corrected score) 12/28/2023: 3.33 Goal status: IN PROGRESS  2.  Patient (> 22 years old) will complete five times sit to stand test in < 30 seconds indicating an increased LE strength and improved balance. Baseline: 54 sec with UE support and RW  12/28/2023: 42.46 seconds with UE support and RW with min/mod A from pt's transport chair Goal status: IN PROGRESS   3.  Patient will increase 10 meter walk test by 0.29m/s as to improve gait speed for better community ambulation and to reduce fall risk. Baseline: 12/05/23: pt currently requires min physical assistance and is now able to walk far enough to complete this test at next visit 12/28/2023: 0.08 m/s ( and 2 seconds) using RW with skilled heavy min/mod A for balance Goal status: IN PROGRESS  4.  Patient will reduce timed up and go to <60 sec seconds to reduce fall risk and demonstrate improved transfer/gait ability. Baseline: 2:24 min with RW and min-mod assist  12/28/2023: 58min15sec using RW with min/mod A Goal status: IN PROGRESS     ASSESSMENT:  CLINICAL IMPRESSION:  Patient is a 89y.o. female who was seen today for physical therapy treatment for impaired balance, significant generalized and B LE weakness, impaired endurance, impaired motor planning, impaired bed mobility, impaired transfers, and gait deficits due to multiple  comorbidities including Alzheimer's disease + vascular dementia. Therapy session focused on supine<>sit training and education with caregiver on proper logroll technique and ideas of cues/external targets to provide pt at home to increase pt's independence with this mobility task and decrease caregiver burden. Pt participated in function sit<>stand training with external target to promote full upright posture with pt able to complete this with only min A today! Pt also increased her standing tolerance to 3 minutes today while performing dual-task challenge to promote full upright posture. Overall, pt continues to require min A (more consistently but mod A with fatigue) for basic functional mobility tasks including supine<>sit, sit<>stands, stand pivot transfers, and gait training using RW. Pt will benefit from continued skilled PT to address strength and balance impairments, to improve safety with bed mobility, transfers, gait and allow improvement in overall QoL and reduce care giver burden.  OBJECTIVE IMPAIRMENTS: Abnormal gait, decreased activity tolerance, decreased balance, decreased cognition, decreased coordination, decreased endurance, decreased knowledge of condition, decreased mobility, difficulty walking, decreased ROM, decreased strength, decreased safety awareness, hypomobility, impaired flexibility, impaired vision/preception, improper body mechanics, postural dysfunction, and pain.   ACTIVITY LIMITATIONS: carrying, lifting, bending, standing, squatting, stairs, transfers, bed mobility, continence, bathing, toileting, dressing, hygiene/grooming, and locomotion level  PARTICIPATION LIMITATIONS: cleaning, laundry, shopping, and community activity  PERSONAL FACTORS: Age, Behavior pattern, Fitness, and 3+ comorbidities: HTN, L femur fx, dimentia  are also affecting patient's functional outcome.   REHAB POTENTIAL: Good  CLINICAL DECISION MAKING: Evolving/moderate complexity  EVALUATION  COMPLEXITY: Moderate  PLAN:  PT FREQUENCY: 1-2x/week  PT DURATION: 12 weeks  PLANNED INTERVENTIONS: 97164- PT Re-evaluation, 97110-Therapeutic exercises, 97530- Therapeutic activity, 97112- Neuromuscular re-education, 97535- Self Care, 14782- Manual therapy, [id number]- Gait training, Balance training, Stair training, Visual/preceptual remediation/compensation, Cognitive remediation, DME instructions, Wheelchair mobility training, Cryotherapy, and Moist heat  PLAN FOR NEXT SESSION:  - L hip strengthening for improved stance control - sit>stand training with Blaze Pod sequence to promote gradual anterior lean/weight shift while rising to stand - standing upright posture training with overhead reaching (can stand as of 01/02/2024) - continue side stepping and backwards stepping (in // bars?) over targets  - gait training using RW, goal of achieving >61ft consistently - follow-up on HEP - incorporate L UE into activities - dual-cognitive tasks with above interventions  Overall: Activity tolerance, standing tolerance and balance, gait training. Transfer training (sits before turning fully).    Ginny Forth, PT Physical Therapist - [facility]  [facility]  4:30 PM 01/02/24

## 2024-01-02 NOTE — Therapy (Signed)
 OUTPATIENT OCCUPATIONAL THERAPY NEURO TREATMENT NOTE  Patient Name: Kerri [name] MRN: [id number] DOB:15-Jun-1931, 89 y.o., female Today's Date: 01/02/2024  PCP: Wilford Corner, PA-C REFERRING PROVIDER: Lonell Face, MD  END OF SESSION:  OT End of Session - 01/02/24 1702     Visit Number 4    Number of Visits 12    Date for OT Re-Evaluation 03/06/24    OT Start Time 1450    OT Stop Time 1530    OT Time Calculation (min) 40 min    Activity Tolerance Patient tolerated treatment well    Behavior During Therapy [facility] for tasks assessed/performed             Past Medical History:  Diagnosis Date   Arthritis    Past Surgical History:  Procedure Laterality Date   INTRAMEDULLARY (IM) NAIL INTERTROCHANTERIC Left 09/06/2022   Procedure: INTRAMEDULLARY (IM) NAIL INTERTROCHANTERIC;  Surgeon: Signa Kell, MD;  Location: [facility] ORS;  Service: Orthopedics;  Laterality: Left;   PACEMAKER LEADLESS INSERTION N/A 01/07/2021   Procedure: PACEMAKER LEADLESS INSERTION;  Surgeon: Marcina Millard, MD;  Location: [facility] INVASIVE CV LAB;  Service: Cardiovascular;  Laterality: N/A;   PPM GENERATOR REMOVAL N/A 01/07/2021   Procedure: PPM GENERATOR REMOVAL;  Surgeon: Marcina Millard, MD;  Location: [facility] INVASIVE CV LAB;  Service: Cardiovascular;  Laterality: N/A;   Patient Active Problem List   Diagnosis Date Noted   Acute postoperative anemia due to expected blood loss 09/07/2022   Displaced intertrochanteric fracture of left femur, initial encounter for closed fracture (HCC) 09/05/2022   Orthostatic hypotension 07/12/2022   Diarrhea 07/10/2022   COVID-19 virus infection 07/08/2022   Electrolyte abnormality 07/08/2022   Malnutrition of moderate degree 07/08/2022   Acute delirium 07/08/2022   History of recurrent UTI (urinary tract infection) 01/30/2022   Constipation    Elevated brain natriuretic peptide (BNP) level 10/15/2021   Generalized weakness 10/15/2021   Chronic  kidney disease, stage 3b (HCC) 10/15/2021   Normal pressure hydrocephalus (HCC) 10/15/2021   Dementia without behavioral disturbance (HCC) 10/15/2021   Hypothyroidism 10/15/2021   Essential hypertension 10/15/2021   Overactive bladder 10/15/2021   Moderate mitral regurgitation 01/13/2021   Status post placement of cardiac pacemaker 01/13/2021   CHB (complete heart block) (HCC) 01/07/2021   Chronic systolic CHF (congestive heart failure) (HCC) 12/17/2020   Postmenopausal osteoporosis 08/26/2016   Mixed Alzheimer's and vascular dementia (HCC) 06/13/2014    ONSET DATE: 09/05/2022  REFERRING DIAG: Intertrochanteric Left Femur Fracture, Lumbar Disc Disease with stenosis  THERAPY DIAG:  Muscle weakness (generalized)  Rationale for Evaluation and Treatment: Rehabilitation  SUBJECTIVE:   SUBJECTIVE STATEMENT: Pt. Reports doing well today Pt accompanied by:  [name]-Personal Caregiver  PERTINENT HISTORY: Pt. Is a 89 y.o. female who sustained an Intertrochanteric Left Femur Fracture, Lumbar Disc Disease with Stenosis, Chronic compression Fractures. 09/05/2022, PMHx includes: Mixed Dementia (Alzheimer's Disease, and Vascular Dementia), Pacemaker, Mild spinal canal stenosis L2-3 & L3-4, Hx of UTI.  PRECAUTIONS: Pacemaker (6-8 years)  WEIGHT BEARING RESTRICTIONS: No  PAIN:  Are you having pain? No  FALLS: Has patient fallen in last 6 months? No, 3 assisted sitdowns to the flloor- When Pt. feels like she is not able to walk any further.  LIVING ENVIRONMENT: Lives with: [name] Lives in: House Stairs:  One story with a basement; 2 steps to enter from the garage. In step down to the den; and and one step ramp into the kitchen Has following equipment at home: Wheelchair (manual), walker, hospital bed  with rails, and a lift chair, BCommode, and shower chair. Gait belt, Life Alert, hand held shower head.  PLOF: Independent  PATIENT GOALS:  To be able to stand, and walk to the bathroom. Left hand  -Pt. Is not using her left hand much  OBJECTIVE:  Note: Objective measures were completed at Evaluation unless otherwise noted.  HAND DOMINANCE: Right  ADLs:  Transfers/ambulation related to ADLs: Eating: Independent, assist with set-up for cutting food Grooming: Independent set-up, seated  UB Dressing: Independent with set-up LB Dressing: Mod/MaxA pants, and socks Toileting: Assist with toilet transfers, clothing negotiation, Independent toilet hygiene. Some incontinence episodes Bathing: MaxA Tub Shower transfers: Max A shower transfers  IADLs: Shopping: Dependent. Will accompany at times. Light housekeeping: Occasionally wipes the table, and puts items in the trash, folds laundry when brought sitting Meal Prep: Will help assist with baking/mixing items, no cooking, meals provided for the Pt. Community mobility: Relies on family and friends Medication management: Medication management provided for the Pt. Financial management:  No change from baseline   MOBILITY STATUS: Hx of Falls, Needs assist   FUNCTIONAL OUTCOME MEASURES: TBD  UPPER EXTREMITY ROM:    Active ROM Right eval Left eval  Shoulder flexion 119(140) 112(130)  Shoulder abduction 112 98  Shoulder adduction    Shoulder extension    Shoulder internal rotation    Shoulder external rotation    Elbow flexion WNL WNL  Elbow extension WNL WNL  Wrist flexion    Wrist extension WNL WNL  Wrist ulnar deviation    Wrist radial deviation    Wrist pronation    Wrist supination    (Blank rows = not tested)  UPPER EXTREMITY MMT:     MMT Right eval Left eval  Shoulder flexion 3-/5 3-/5  Shoulder abduction 3-/5 3-/5  Shoulder adduction    Shoulder extension    Shoulder internal rotation    Shoulder external rotation    Middle trapezius    Lower trapezius    Elbow flexion 4/5 3+/5  Elbow extension 4/5 3+/5  Wrist flexion    Wrist extension 4/5 3+/5  Wrist ulnar deviation    Wrist radial deviation     Wrist pronation    Wrist supination    (Blank rows = not tested)  HAND FUNCTION: Grip strength: Right: 29 lbs; Left: 21 lbs; Pinch strength: Lateral: R: 10#, L: 6#, 3pt. Pinch: R: 8#, L: 5#  COORDINATION: TBD 9 Hole Peg test: Right: 1 min. & 2  sec; Left: 2 min. & 1 sec  SENSATION: Light touch: Inconsistent  COGNITION: Overall cognitive status: History of cognitive impairments - at baseline Alzheimer's Dementia, and Vascular Dementia  VISION: Subjective report:  Glasses all the time   VISION ASSESSMENT: Wears glasses all the time at baseline                                                                                                                            TREATMENT  DATE: 01/02/2024  Therapeutic Ex.:   -Performed BUE strengthening using a 2# dowel ex. 2/2 to weakness. Bilateral shoulder flexion, chest press, circular patterns, and elbow flexion/extension for 1 set  10 reps each.  -Performed BUE strengthening using 2# hand weight for elbow flexion, and extension, forearm supination/pronation, and right wrist flexion/extension, 1# weight for left wrist flexion, extension, and radial deviation.  Therapeutic Activities:   -Lateral, and 3pt. pinch strengthening using yellow, and red resistive clips for the left hand. Red and green resistive clips for the right hand.  -Combinations of movements for pinch with reach up in various planes to to further increase the complexity of the task.    PATIENT EDUCATION: Education details: LUE functioning Person educated: Patient and Caregiver [name] Education method: Explanation Education comprehension: needs further education  HOME EXERCISE PROGRAM: To be assessed, and provided as indicated   GOALS: Goals reviewed with patient? Yes  SHORT TERM GOALS: Target date: 01/24/2024    Pt. Will require Supervision for BUE HEPs Baseline: Eval: No current HEP Goal status: INITIAL   LONG TERM GOALS: Target date: 03/06/2024  Pt. Will  increase BUE strength by 2 mm grades to assist with ADLs/IADLs. Baseline: Eval: Right: shoulder flexion 3-/5, abduction 3-/5, elbow flexion 4/5, extension 4/5, wrist extension 4/5; Left: shoulder flexion: 3-/5, abduction: 3-/5, elbow flexion 3+/5, extension 3+/5, wrist extension 3+/5 Goal status: INITIAL  2.  Pt. Will improve left grip strength by 4 # to assist with hiking pants. Baseline: R: 29#, L: 21# Goal status: INITIAL  3.  Pt. Will initiate engaging the left hand  during daily activity 75% of the time with minimal cuing Baseline: Eval: Limited initiation , and engagement of the LUE during daily tasks.  Goal status: INITIAL  4.  Pt. Demonstrate compensatory adaptive techniques to assist with ADLs/IADLs. Baseline: Eval: Education to be provided Goal status: INITIAL  ASSESSMENT:  CLINICAL IMPRESSION:  Pt.  was provided with more yellow theraputty this session as they report it was left in PT last week. Pt. requires consistent verbal, and tactile cues for proper hand position, form, and technique with each exercise. Pt. initiated engaging the LUE, and hand more consistently during each task today. Pt. continues to benefit from OT services to work on improving LUE functioning in order to improve, and maximize participation with ADLs, and IADL tasks.  PERFORMANCE DEFICITS: in functional skills including ADLs, IADLs, coordination, dexterity, proprioception, ROM, strength, Fine motor control, Gross motor control, mobility, decreased knowledge of use of DME, and UE functional use, cognitive skills including attention, problem solving, and safety awareness, and psychosocial skills including environmental adaptation and routines and behaviors.   IMPAIRMENTS: are limiting patient from ADLs, IADLs, education, and leisure.   CO-MORBIDITIES: has  other co-morbidities that affects occupational performance. Patient will benefit from skilled OT to address above impairments and improve overall  function.  MODIFICATION OR ASSISTANCE TO COMPLETE EVALUATION: Min-Moderate modification of tasks or assist with assess necessary to complete an evaluation.  OT OCCUPATIONAL PROFILE AND HISTORY: Detailed assessment: Review of records and additional review of physical, cognitive, psychosocial history related to current functional performance.  CLINICAL DECISION MAKING: Moderate - several treatment options, min-mod task modification necessary  REHAB POTENTIAL: Good  EVALUATION COMPLEXITY: Moderate    PLAN:  OT FREQUENCY: 1x/week  OT DURATION: 12 weeks  PLANNED INTERVENTIONS: 97168 OT Re-evaluation, 97535 self care/ADL training, 16109 therapeutic exercise, 97530 therapeutic activity, 97112 neuromuscular re-education, 97140 manual therapy, 97018 paraffin, 60454 moist heat, 97034 contrast bath, passive range  of motion, functional mobility training, patient/family education, and DME and/or AE instructions  RECOMMENDED OTHER SERVICES: PT  CONSULTED AND AGREED WITH PLAN OF CARE: Patient and family member/caregiver  PLAN FOR NEXT SESSION: Treatment   Olegario Messier, MS, OTR/L 01/02/2024, 5:04 PM

## 2024-01-03 ENCOUNTER — Encounter: Payer: Medicare Other | Admitting: Physical Therapy

## 2024-01-04 ENCOUNTER — Ambulatory Visit: Payer: Medicare Other | Admitting: Physical Therapy

## 2024-01-04 DIAGNOSIS — R262 Difficulty in walking, not elsewhere classified: Secondary | ICD-10-CM

## 2024-01-04 DIAGNOSIS — R278 Other lack of coordination: Secondary | ICD-10-CM

## 2024-01-04 DIAGNOSIS — M6281 Muscle weakness (generalized): Secondary | ICD-10-CM

## 2024-01-04 DIAGNOSIS — R5381 Other malaise: Secondary | ICD-10-CM | POA: Diagnosis not present

## 2024-01-04 DIAGNOSIS — R269 Unspecified abnormalities of gait and mobility: Secondary | ICD-10-CM

## 2024-01-04 NOTE — Therapy (Signed)
 OUTPATIENT PHYSICAL THERAPY NEURO TREATMENT    Patient Name: Kerri [name] MRN: [id number] DOB:August 12, 1931, 89 y.o., female Today's Date: 01/04/2024   PCP: Wilford Corner, PA-C  REFERRING PROVIDER: Wilford Corner, PA-C   END OF SESSION:   PT End of Session - 01/04/24 1447     Visit Number 12    Number of Visits 24    Date for PT Re-Evaluation 02/08/24    Progress Note Due on Visit 10    PT Start Time 1447    PT Stop Time 1530    PT Time Calculation (min) 43 min    Equipment Utilized During Treatment Gait belt    Activity Tolerance Patient tolerated treatment well;No increased pain    Behavior During Therapy WFL for tasks assessed/performed               Past Medical History:  Diagnosis Date   Arthritis    Past Surgical History:  Procedure Laterality Date   INTRAMEDULLARY (IM) NAIL INTERTROCHANTERIC Left 09/06/2022   Procedure: INTRAMEDULLARY (IM) NAIL INTERTROCHANTERIC;  Surgeon: Signa Kell, MD;  Location: [facility] OR[facility];  Service: Orthopedics;  Laterality: Left;   PACEMAKER LEADLESS INSERTION N/A 01/07/2021   Procedure: PACEMAKER LEADLESS INSERTION;  Surgeon: Marcina Millard, MD;  Location: [facility];  Service: Cardiovascular;  Laterality: N/A;   PPM GENERATOR REMOVAL N/A 01/07/2021   Procedure: PPM GENERATOR REMOVAL;  Surgeon: Marcina Millard, MD;  Location: [facility];  Service: Cardiovascular;  Laterality: N/A;   Patient Active Problem List   Diagnosis Date Noted   Acute postoperative anemia due to expected blood loss 09/07/2022   Displaced intertrochanteric fracture of left femur, initial encounter for closed fracture (HCC) 09/05/2022   Orthostatic hypotension 07/12/2022   Diarrhea 07/10/2022   COVID-19 virus infection 07/08/2022   Electrolyte abnormality 07/08/2022   Malnutrition of moderate degree 07/08/2022   Acute delirium 07/08/2022   History of recurrent UTI (urinary tract infection) 01/30/2022    Constipation    Elevated brain natriuretic peptide (BNP) level 10/15/2021   Generalized weakness 10/15/2021   Chronic kidney disease, stage 3b (HCC) 10/15/2021   Normal pressure hydrocephalus (HCC) 10/15/2021   Dementia without behavioral disturbance (HCC) 10/15/2021   Hypothyroidism 10/15/2021   Essential hypertension 10/15/2021   Overactive bladder 10/15/2021   Moderate mitral regurgitation 01/13/2021   Status post placement of cardiac pacemaker 01/13/2021   CHB (complete heart block) (HCC) 01/07/2021   Chronic systolic CHF (congestive heart failure) (HCC) 12/17/2020   Postmenopausal osteoporosis 08/26/2016   Mixed Alzheimer's and vascular dementia (HCC) 06/13/2014    ONSET DATE: getting worse over the last year   REFERRING DIAG:  Diagnosis  M51.369 (ICD-10-CM) - Degeneration of lumbar intervertebral disc    THERAPY DIAG:   Muscle weakness (generalized)  Debility  Difficulty in walking, not elsewhere classified  Abnormality of gait and mobility  Other lack of coordination   Rationale for Evaluation and Treatment: Rehabilitation  SUBJECTIVE:  SUBJECTIVE STATEMENT:  Pt reports she has been busy today, ate out for lunch.   Caregiver, [name], reports using the transport chair as a prop to improve sequencing bed mobility has helped some.  Pt reports she wants to get better and is trying. Caregiver confirms pt is not standing every hour, but they are trying to be more consistent with it.   From Eval:  Hx given from care giver; [name] .  Hx of L femur fx roughly a year and half ago. Hx of lumbar stenosis and BLE weakness. States that they want PT to address pain and weakness to allow continued mobility as tolerated. Care giver reports that she well complain of pain/numbness with mobility, but  more willing to attemept to mobilize if "its something she wants"  Will self propel in [facility] at home  Pt accompanied by:  care giver   PERTINENT HISTORY:   From recent Neurology visit from MD.  "1. Mixed dementia (Alzheimer's disease + vascular dementia) with significant ventriculomegaly (with negative lumbar drain trial in 2015) in a patient with history of sepsis secondary to UTI treated with trimethoprim January 2023. Medication appears to help with UTI and improved mental status - reports good control with current medication - Continue Donepezil 10 mg by mouth once a day  - Continue Memantine 10 mg by mouth two times a day   - Continue keeping patient involved in daily activities, such as reading out loud, helping prepare meals, picking out clothes.  - Recommend patient does not live alone. Patient should have 24/7 supervision.   2. Right leg pain with inability to pick up feet (right>left - likely from ventriculomegaly + peripheral neuropathy) patient with history of lumbar disc disease with stenosis - reports improvement during recent trial of prednisone. Suspicious for lumbar Disc Disease in addition to decondition Reviewed PCP note: Ongoing leg pain with intermittent weakness. On exam she does have some swelling around the right knee. Unsure if she could have some severe arthritis catching as she walks. Will obtain x-ray of the right knee today. Trial of prednisone 20 mg once a day for 5 days. We did discuss that she has some degenerative lumbar disc disease with stenosis and likely playing a role in right leg weakness. She has a pacemaker and we will defer MRI of the lumbar spine. Keep follow-up with neurology and if they feel it is necessary they could possibly pursue nerve conduction study. Inform caretaker to help patient and stay by her side when walking.   - Patient says that her legs feel "heavy" and that she has difficulty initiating the first step when walking - consistent with  magnetic gait in patient with hydrocephalus and brain atrophy - Previous (-) Lumbar Puncture trial at [facility] (01/09/14)."   PAIN:  Are you having pain? No  PRECAUTIONS: Fall  RED FLAGS: None   WEIGHT BEARING RESTRICTIONS: No  FALLS: Has patient fallen in last 6 months? Yes. Number of falls 2-3 controlled falls with assist to floor.    LIVING ENVIRONMENT: Lives with:  caregiver 7 days a week. Alone for an hour or 2 a day, but only when alseep.   Lives in: House/apartment Stairs: Yes: Internal: 1 but ramp in place steps; none and External: 1 steps; none Has following equipment at home: Walker - 2 wheeled  PLOF: Requires assistive device for independence, Needs assistance with ADLs, Needs assistance with homemaking, Needs assistance with gait, and Needs assistance with transfers  PATIENT GOALS: get stronger so she  can "go and do"   OBJECTIVE:  Note: Objective measures were completed at Evaluation unless otherwise noted.  DIAGNOSTIC FINDINGS:   EXAM: CT LUMBAR SPINE WITHOUT CONTRAST IMPRESSION: 1. Transitional anatomy. Adhering to convention from the prior lumbar spine MRI, the last fully formed disc space is labeled L5-S1. 2. Unchanged chronic compression fractures of L4 and L5. No new fracture of the lumbar spine. 3. Unchanged lumbar spondylosis notable for moderate narrowing of the right lateral recess at L1-2 and L2-3, as well as mild spinal canal stenosis at L2-3 and L3-4.   COGNITION: Overall cognitive status: History of cognitive impairments - at baseline   SENSATION: WFL  COORDINATION: Decreased speed of movement. Decreased ability to follow instructions with MMT   EDEMA:  Mild BLE distal edema   MUSCLE TONE: WFL  MUSCLE LENGTH: Generalized tightness from sedentary   DTRs:  Not assessed   POSTURE: rounded shoulders, forward head, increased lumbar lordosis, and increased thoracic kyphosis  LOWER EXTREMITY ROM:     Unable formally assess due to     LOWER EXTREMITY MMT:    MMT Right Eval Left Eval  Difficult to assess due to cognitive impairments, grossly 3+/5 to 4/5 proximal to distal with functional movement    BED MOBILITY:  To be assessed at later visit.   TRANSFERS: Assistive device utilized: Environmental consultant - 2 wheeled  Sit to stand: Mod A Stand to sit: Mod A Chair to chair: Mod A Floor:  unable to complete without total A per care giver   RAMP:  Level of Assistance: Mod A Assistive device utilized: Environmental consultant - 2 wheeled Ramp Comments: caregiver reports use of RW to step down in to home entry   CURB:  Level of Assistance:  not assessed at eval  Assistive device utilized:  rails or RW  Curb Comments: care giver reports use of RW   STAIRS: Level of Assistance:  not assessed at [employer] Technique: Step to Pattern with Bilateral Rails Number of Stairs:   Height of Stairs:   Comments: no use of stairs at home for >1.5 years   GAIT: Gait pattern: decreased step length- Right, decreased stance time- Left, decreased hip/knee flexion- Right, knee flexed in stance- Right, knee flexed in stance- Left, and poor foot clearance- Right Distance walked: 20 Assistive device utilized: Walker - 2 wheeled Level of assistance: Min A and Mod A Comments: R foot drag with TUG. Increased flexed knee with fatigue.   FUNCTIONAL TESTS:  5 times sit to stand: 54 Timed up and go (TUG): 2:24 with min-mod assost  10 meter walk test: TBD   PATIENT SURVEYS:  PsFS: 4.5.   Getting into and out of bed: 0  Standing erect with walking: 5 Walking to bathroom: 4                                                                                                                               TREATMENT  DATE:   01/04/2024  Discussed placing a large wooden board underneath pt's feet while trying to bridge in the bed due to the bed being so compliant. Caregiver showed picture of pt's bed bowing when pt is sitting on the edge due to the  softness of the mattress.  Pt arrives to session in transport chair. Unless otherwise stated, min A was provided and gait belt donned in order to ensure pt safety throughout.  Sit>stand transport chair>RW with min A and R stand pivot from transport chair>EOM using RW with min A. Pt with improving ability to listen to therapist's cuing for external targets to step towards to ensure she turns fully and backs up to mat before initiating sitting.   Repeated sit<>stands from EOM using RW with external target of Blaze Pod at top of mirror to promote fully upright stand - x15reps requiring 35min46sec - question cuing to help pt recall to push-up from mat during each stand, rather than pulling up on RW. Performs this with min A and therapist facilitating increased anterior trunk lean when initiating coming to stand as pt tends to lean back and push legs against mat.  Standing tolerance task using RW support and 3 Blaze Pods at top of mirror to promote upright posture, set on 2 colors (red = R hand, blue = L hand) for added cognitive dual-task - light progressed to standard min A for balance and pt stood for 3 minutes and 30seconds! - requires tactile cuing to recall correct hand to tap on each color.   Progressed to alternating foot taps to 2 Blaze Pods with B UE support on RW and heavy min A for balance due to R posterolateral lean  - performed 8 targets x2 (seated break between) because pt becomes fatigued  - demos increased upright posture today and ability to listen to therapist's cuing to focus on upright posture - continues to have more weakness during L stance phase with L hip drop when trying to tap R LE to target  Gait training 36ft using RW with heavy min A due to fatigue and +2 bringing chair follow - therapist continuing to provide min facilitation for weight shifting - max verbal/visual cuing for increased step lengths bilaterally with pt having poor foot clearance, small steps, and partial  crouched posture throughout with excessive trunk/hip flexion.    PATIENT EDUCATION: Education details: POC. HEP to improve generalized strength and activity tolerance  Person educated: Patient and Caregiver   Education method: Medical illustrator Education comprehension: verbalized understanding  HOME EXERCISE PROGRAM:  Access Code: [email or web address] URL: [email or web address] Date: 12/20/2023 Prepared by: Casimiro Needle  Exercises - Sit to Stand with Armchair  - 1 x daily - 7 x weekly - 3 sets - 5 reps - Standing March with Counter Support  - 1 x daily - 7 x weekly - 2 sets - 10 reps - Supine Bridge  - 1 x daily - 7 x weekly - 2 sets - 10 reps - Supine Heel Slide with Strap  - 1 x daily - 7 x weekly - 2 sets - 10 reps - Supine Hip Abduction AROM  - 1 x daily - 7 x weekly - 3 sets - 10 reps - Seated Long Arc Quad  - 1 x daily - 7 x weekly - 3 sets - 10 reps - Seated March  - 1 x daily - 7 x weekly - 3 sets - 10 reps - 2 hold - Seated Hip Abduction with Resistance  -  1 x daily - 7 x weekly - 2 sets - 10 reps    GOALS: Goals reviewed with patient? Yes   SHORT TERM GOALS: Target date: 12/15/2023   Patient will be independent in home exercise program to improve strength/mobility for better functional independence with ADLs. Baseline: provided on 11/16/23 12/20/2023: Updated  Goal status: IN PROGRESS   LONG TERM GOALS: Target date: 02/09/2024    Patient will increase PSFS score to equal to or greater than and average of 2 points   to demonstrate statistically significant improvement in mobility and quality of life.  Baseline: initial eval: 3.0 (corrected score) 12/28/2023: 3.33 Goal status: IN PROGRESS  2.  Patient (> 50 years old) will complete five times sit to stand test in < 30 seconds indicating an increased LE strength and improved balance. Baseline: 54 sec with UE support and RW  12/28/2023: 42.46 seconds with UE support and RW with min/mod A from pt's  transport chair Goal status: IN PROGRESS   3.  Patient will increase 10 meter walk test by 0.86m/s as to improve gait speed for better community ambulation and to reduce fall risk. Baseline: 12/05/23: pt currently requires min physical assistance and is now able to walk far enough to complete this test at next visit 12/28/2023: 0.08 m/s ( and 2 seconds) using RW with skilled heavy min/mod A for balance Goal status: IN PROGRESS  4.  Patient will reduce timed up and go to <60 sec seconds to reduce fall risk and demonstrate improved transfer/gait ability. Baseline: 2:24 min with RW and min-mod assist  12/28/2023: 60min15sec using RW with min/mod A Goal status: IN PROGRESS     ASSESSMENT:  CLINICAL IMPRESSION:  Patient is a 89y.o. female who was seen today for physical therapy treatment for impaired balance, significant generalized and B LE weakness, impaired endurance, impaired motor planning, impaired bed mobility, impaired transfers, and gait deficits due to multiple comorbidities including Alzheimer's disease + vascular dementia. Therapy session focused on sit<>stand training with external target to promote full upright posture with pt able to complete this 15 times today with only min A! Pt also increased her standing tolerance to 3 minutes and 30 seconds today while performing dual-task challenge to promote full upright posture. Overall, pt continues to require min A (more consistently but mod A with fatigue) for basic functional mobility tasks including supine<>sit, sit<>stands, stand pivot transfers, and gait training using RW. Pt will benefit from continued skilled PT to address strength and balance impairments, to improve safety with bed mobility, transfers, gait and allow improvement in overall QoL and reduce care giver burden.     OBJECTIVE IMPAIRMENTS: Abnormal gait, decreased activity tolerance, decreased balance, decreased cognition, decreased coordination, decreased endurance,  decreased knowledge of condition, decreased mobility, difficulty walking, decreased ROM, decreased strength, decreased safety awareness, hypomobility, impaired flexibility, impaired vision/preception, improper body mechanics, postural dysfunction, and pain.   ACTIVITY LIMITATIONS: carrying, lifting, bending, standing, squatting, stairs, transfers, bed mobility, continence, bathing, toileting, dressing, hygiene/grooming, and locomotion level  PARTICIPATION LIMITATIONS: cleaning, laundry, shopping, and community activity  PERSONAL FACTORS: Age, Behavior pattern, Fitness, and 3+ comorbidities: HTN, L femur fx, dimentia  are also affecting patient's functional outcome.   REHAB POTENTIAL: Good  CLINICAL DECISION MAKING: Evolving/moderate complexity  EVALUATION COMPLEXITY: Moderate  PLAN:  PT FREQUENCY: 1-2x/week  PT DURATION: 12 weeks  PLANNED INTERVENTIONS: [id number]- PT Re-evaluation, 97110-Therapeutic exercises, 97530- Therapeutic activity, 97112- Neuromuscular re-education, 97535- Self Care, [id number]- Manual therapy, [phone]- Gait training, Balance training, Stair training,  Visual/preceptual remediation/compensation, Cognitive remediation, DME instructions, Wheelchair mobility training, Cryotherapy, and Moist heat  PLAN FOR NEXT SESSION:  - L hip strengthening for improved stance control  - bridges - sit>stand training with Blaze Pod sequence to promote increased anterior lean/weight shift while rising to stand - standing upright posture training with overhead reaching (can stand 33min30sec as of 01/04/2024) - gait training using RW, goal of achieving >69ft consistently - follow-up on HEP - incorporate L UE into activities - dual-cognitive tasks with above interventions - continue side stepping and backwards stepping (in // bars?) over targets   Overall: Activity tolerance, standing tolerance and balance, gait training. Transfer training (sits before turning fully).    Ginny Forth,  PT Physical Therapist - [facility]  [facility]  6:22 PM 01/04/24

## 2024-01-05 ENCOUNTER — Encounter: Payer: Medicare Other | Admitting: Physical Therapy

## 2024-01-09 ENCOUNTER — Ambulatory Visit: Payer: Medicare Other

## 2024-01-09 ENCOUNTER — Ambulatory Visit: Payer: Medicare Other | Admitting: Physical Therapy

## 2024-01-10 ENCOUNTER — Encounter: Payer: Medicare Other | Admitting: Physical Therapy

## 2024-01-10 ENCOUNTER — Encounter: Payer: Self-pay | Admitting: Occupational Therapy

## 2024-01-10 ENCOUNTER — Ambulatory Visit: Admitting: Physical Therapy

## 2024-01-10 ENCOUNTER — Ambulatory Visit: Attending: Family Medicine | Admitting: Occupational Therapy

## 2024-01-10 DIAGNOSIS — R5381 Other malaise: Secondary | ICD-10-CM | POA: Insufficient documentation

## 2024-01-10 DIAGNOSIS — M6281 Muscle weakness (generalized): Secondary | ICD-10-CM | POA: Diagnosis present

## 2024-01-10 DIAGNOSIS — R269 Unspecified abnormalities of gait and mobility: Secondary | ICD-10-CM

## 2024-01-10 DIAGNOSIS — R278 Other lack of coordination: Secondary | ICD-10-CM | POA: Diagnosis present

## 2024-01-10 DIAGNOSIS — R262 Difficulty in walking, not elsewhere classified: Secondary | ICD-10-CM | POA: Diagnosis present

## 2024-01-10 NOTE — Therapy (Signed)
 OUTPATIENT OCCUPATIONAL THERAPY NEURO TREATMENT NOTE  Patient Name: Kerri [name] MRN: [id number] DOB:1931-08-30, 89 y.o., female Today's Date: 01/10/2024  PCP: Wilford Corner, PA-C REFERRING PROVIDER: Lonell Face, MD  END OF SESSION:  OT End of Session - 01/10/24 1529     Visit Number 5    Number of Visits 12    Date for OT Re-Evaluation 03/06/24    OT Start Time 1530    OT Stop Time 1615    OT Time Calculation (min) 45 min    Activity Tolerance Patient tolerated treatment well    Behavior During Therapy [facility] for tasks assessed/performed             Past Medical History:  Diagnosis Date   Arthritis    Past Surgical History:  Procedure Laterality Date   INTRAMEDULLARY (IM) NAIL INTERTROCHANTERIC Left 09/06/2022   Procedure: INTRAMEDULLARY (IM) NAIL INTERTROCHANTERIC;  Surgeon: Signa Kell, MD;  Location: [facility] ORS;  Service: Orthopedics;  Laterality: Left;   PACEMAKER LEADLESS INSERTION N/A 01/07/2021   Procedure: PACEMAKER LEADLESS INSERTION;  Surgeon: Marcina Millard, MD;  Location: [facility] INVASIVE CV LAB;  Service: Cardiovascular;  Laterality: N/A;   PPM GENERATOR REMOVAL N/A 01/07/2021   Procedure: PPM GENERATOR REMOVAL;  Surgeon: Marcina Millard, MD;  Location: [facility] INVASIVE CV LAB;  Service: Cardiovascular;  Laterality: N/A;   Patient Active Problem List   Diagnosis Date Noted   Acute postoperative anemia due to expected blood loss 09/07/2022   Displaced intertrochanteric fracture of left femur, initial encounter for closed fracture (HCC) 09/05/2022   Orthostatic hypotension 07/12/2022   Diarrhea 07/10/2022   COVID-19 virus infection 07/08/2022   Electrolyte abnormality 07/08/2022   Malnutrition of moderate degree 07/08/2022   Acute delirium 07/08/2022   History of recurrent UTI (urinary tract infection) 01/30/2022   Constipation    Elevated brain natriuretic peptide (BNP) level 10/15/2021   Generalized weakness 10/15/2021   Chronic kidney  disease, stage 3b (HCC) 10/15/2021   Normal pressure hydrocephalus (HCC) 10/15/2021   Dementia without behavioral disturbance (HCC) 10/15/2021   Hypothyroidism 10/15/2021   Essential hypertension 10/15/2021   Overactive bladder 10/15/2021   Moderate mitral regurgitation 01/13/2021   Status post placement of cardiac pacemaker 01/13/2021   CHB (complete heart block) (HCC) 01/07/2021   Chronic systolic CHF (congestive heart failure) (HCC) 12/17/2020   Postmenopausal osteoporosis 08/26/2016   Mixed Alzheimer's and vascular dementia (HCC) 06/13/2014    ONSET DATE: 09/05/2022  REFERRING DIAG: Intertrochanteric Left Femur Fracture, Lumbar Disc Disease with stenosis  THERAPY DIAG:  Muscle weakness (generalized)  Debility  Rationale for Evaluation and Treatment: Rehabilitation  SUBJECTIVE:   SUBJECTIVE STATEMENT: Pt reports [name] is her R and L hand.  Pt accompanied by:  [name]-Personal Caregiver  PERTINENT HISTORY: Pt. Is a 89 y.o. female who sustained an Intertrochanteric Left Femur Fracture, Lumbar Disc Disease with Stenosis, Chronic compression Fractures. 09/05/2022, PMHx includes: Mixed Dementia (Alzheimer's Disease, and Vascular Dementia), Pacemaker, Mild spinal canal stenosis L2-3 & L3-4, Hx of UTI.  PRECAUTIONS: Pacemaker (6-8 years)  WEIGHT BEARING RESTRICTIONS: No  PAIN:  Are you having pain? Yes 2/10 pain in hips  FALLS: Has patient fallen in last 6 months? No, 3 assisted sitdowns to the flloor- When Pt. feels like she is not able to walk any further.  LIVING ENVIRONMENT: Lives with: [name] Lives in: House Stairs:  One story with a basement; 2 steps to enter from the garage. In step down to the den; and and one step ramp into the  kitchen Has following equipment at home: Wheelchair (manual), walker, hospital bed with rails, and a lift chair, BCommode, and shower chair. Gait belt, Life Alert, hand held shower head.  PLOF: Independent  PATIENT GOALS:  To be able to stand, and  walk to the bathroom. Left hand -Pt. Is not using her left hand much  OBJECTIVE:  Note: Objective measures were completed at Evaluation unless otherwise noted.  HAND DOMINANCE: Right  ADLs:  Transfers/ambulation related to ADLs: Eating: Independent, assist with set-up for cutting food Grooming: Independent set-up, seated  UB Dressing: Independent with set-up LB Dressing: Mod/MaxA pants, and socks Toileting: Assist with toilet transfers, clothing negotiation, Independent toilet hygiene. Some incontinence episodes Bathing: MaxA Tub Shower transfers: Max A shower transfers  IADLs: Shopping: Dependent. Will accompany at times. Light housekeeping: Occasionally wipes the table, and puts items in the trash, folds laundry when brought sitting Meal Prep: Will help assist with baking/mixing items, no cooking, meals provided for the Pt. Community mobility: Relies on family and friends Medication management: Medication management provided for the Pt. Financial management:  No change from baseline   MOBILITY STATUS: Hx of Falls, Needs assist   FUNCTIONAL OUTCOME MEASURES: TBD  UPPER EXTREMITY ROM:    Active ROM Right eval Left eval  Shoulder flexion 119(140) 112(130)  Shoulder abduction 112 98  Shoulder adduction    Shoulder extension    Shoulder internal rotation    Shoulder external rotation    Elbow flexion WNL WNL  Elbow extension WNL WNL  Wrist flexion    Wrist extension WNL WNL  Wrist ulnar deviation    Wrist radial deviation    Wrist pronation    Wrist supination    (Blank rows = not tested)  UPPER EXTREMITY MMT:     MMT Right eval Left eval  Shoulder flexion 3-/5 3-/5  Shoulder abduction 3-/5 3-/5  Shoulder adduction    Shoulder extension    Shoulder internal rotation    Shoulder external rotation    Middle trapezius    Lower trapezius    Elbow flexion 4/5 3+/5  Elbow extension 4/5 3+/5  Wrist flexion    Wrist extension 4/5 3+/5  Wrist ulnar deviation     Wrist radial deviation    Wrist pronation    Wrist supination    (Blank rows = not tested)  HAND FUNCTION: Grip strength: Right: 29 lbs; Left: 21 lbs; Pinch strength: Lateral: R: 10#, L: 6#, 3pt. Pinch: R: 8#, L: 5#  COORDINATION: TBD 9 Hole Peg test: Right: 1 min. & 2  sec; Left: 2 min. & 1 sec  SENSATION: Light touch: Inconsistent  COGNITION: Overall cognitive status: History of cognitive impairments - at baseline Alzheimer's Dementia, and Vascular Dementia  VISION: Subjective report:  Glasses all the time   VISION ASSESSMENT: Wears glasses all the time at baseline  TREATMENT DATE: 01/10/2024  Therapeutic Ex.: Ch[place] Si resistive sponge provided and HEP reviewed (reports yellow putty melted into a blanket).  -Performed BUE strengthening using a 2.5# dowel ex 2/2 to weakness. Bilateral shoulder flexion, chest press, circular patterns, and elbow flexion/extension for 1 set  10 reps each.  -Performed BUE strengthening using 1# weight for left wrist flexion, extension, and radial deviation. -Repeated cues and tactile cues for technique  Therapeutic Activities:  - Pt. worked on reaching using the [employer]. Completed first level with L hand and all shapes - unable to raise large blue squares for subsequent levels. Required BUE for 2nd and third levels. RUE only for 4th level.     PATIENT EDUCATION: Education details: LUE functioning Person educated: Patient and Caregiver [name] Education method: Explanation Education comprehension: needs further education  HOME EXERCISE PROGRAM: To be assessed, and provided as indicated   GOALS: Goals reviewed with patient? Yes  SHORT TERM GOALS: Target date: 01/24/2024    Pt. Will require Supervision for BUE HEPs Baseline: Eval: No current HEP Goal status: INITIAL   LONG TERM GOALS: Target date:  03/06/2024  Pt. Will increase BUE strength by 2 mm grades to assist with ADLs/IADLs. Baseline: Eval: Right: shoulder flexion 3-/5, abduction 3-/5, elbow flexion 4/5, extension 4/5, wrist extension 4/5; Left: shoulder flexion: 3-/5, abduction: 3-/5, elbow flexion 3+/5, extension 3+/5, wrist extension 3+/5 Goal status: INITIAL  2.  Pt. Will improve left grip strength by 4 # to assist with hiking pants. Baseline: R: 29#, L: 21# Goal status: INITIAL  3.  Pt. Will initiate engaging the left hand  during daily activity 75% of the time with minimal cuing Baseline: Eval: Limited initiation , and engagement of the LUE during daily tasks.  Goal status: INITIAL  4.  Pt. Demonstrate compensatory adaptive techniques to assist with ADLs/IADLs. Baseline: Eval: Education to be provided Goal status: INITIAL  ASSESSMENT:  CLINICAL IMPRESSION:  Pt issued green resistive sponge as theraputty melted onto a blanket and they do not want more. Tolerated increase to 2.5# dowel. Continues to require consistent verbal and tactile cues for proper hand position, form, and technique with each exercise. Completed shapes tower with L and R hand, minimal cues to include L hand. Pt. continues to benefit from OT services to work on improving LUE functioning in order to improve, and maximize participation with ADLs, and IADL tasks.  PERFORMANCE DEFICITS: in functional skills including ADLs, IADLs, coordination, dexterity, proprioception, ROM, strength, Fine motor control, Gross motor control, mobility, decreased knowledge of use of DME, and UE functional use, cognitive skills including attention, problem solving, and safety awareness, and psychosocial skills including environmental adaptation and routines and behaviors.   IMPAIRMENTS: are limiting patient from ADLs, IADLs, education, and leisure.   CO-MORBIDITIES: has  other co-morbidities that affects occupational performance. Patient will benefit from skilled OT to address  above impairments and improve overall function.  MODIFICATION OR ASSISTANCE TO COMPLETE EVALUATION: Min-Moderate modification of tasks or assist with assess necessary to complete an evaluation.  OT OCCUPATIONAL PROFILE AND HISTORY: Detailed assessment: Review of records and additional review of physical, cognitive, psychosocial history related to current functional performance.  CLINICAL DECISION MAKING: Moderate - several treatment options, min-mod task modification necessary  REHAB POTENTIAL: Good  EVALUATION COMPLEXITY: Moderate    PLAN:  OT FREQUENCY: 1x/week  OT DURATION: 12 weeks  PLANNED INTERVENTIONS: 97168 OT Re-evaluation, 97535 self care/ADL training, 16109 therapeutic exercise, 97530 therapeutic activity, 97112 neuromuscular re-education, 97140 manual therapy, 97018 paraffin, 60454  moist heat, 97034 contrast bath, passive range of motion, functional mobility training, patient/family education, and DME and/or AE instructions  RECOMMENDED OTHER SERVICES: PT  CONSULTED AND AGREED WITH PLAN OF CARE: Patient and family member/caregiver  PLAN FOR NEXT SESSION: Treatment   Kathie Dike, M.S. OTR/L  01/10/24, 3:30 PM  ascom [phone]  01/10/2024, 3:30 PM

## 2024-01-10 NOTE — Therapy (Signed)
 OUTPATIENT PHYSICAL THERAPY NEURO TREATMENT    Patient Name: Kerri [name] MRN: [id number] DOB:Apr 20, 1931, 89 y.o., female Today's Date: 01/10/2024   PCP: Wilford Corner, PA-C  REFERRING PROVIDER: Wilford Corner, PA-C   END OF SESSION:   PT End of Session - 01/10/24 1546     Visit Number 13    Number of Visits 24    Date for PT Re-Evaluation 02/08/24    Progress Note Due on Visit 20    PT Start Time 1615    PT Stop Time 1655    PT Time Calculation (min) 40 min    Equipment Utilized During Treatment Gait belt    Activity Tolerance Patient tolerated treatment well;No increased pain    Behavior During Therapy WFL for tasks assessed/performed                Past Medical History:  Diagnosis Date   Arthritis    Past Surgical History:  Procedure Laterality Date   INTRAMEDULLARY (IM) NAIL INTERTROCHANTERIC Left 09/06/2022   Procedure: INTRAMEDULLARY (IM) NAIL INTERTROCHANTERIC;  Surgeon: Signa Kell, MD;  Location: [facility];  Service: Orthopedics;  Laterality: Left;   PACEMAKER LEADLESS INSERTION N/A 01/07/2021   Procedure: PACEMAKER LEADLESS INSERTION;  Surgeon: Marcina Millard, MD;  Location: [facility];  Service: Cardiovascular;  Laterality: N/A;   PPM GENERATOR REMOVAL N/A 01/07/2021   Procedure: PPM GENERATOR REMOVAL;  Surgeon: Marcina Millard, MD;  Location: [facility];  Service: Cardiovascular;  Laterality: N/A;   Patient Active Problem List   Diagnosis Date Noted   Acute postoperative anemia due to expected blood loss 09/07/2022   Displaced intertrochanteric fracture of left femur, initial encounter for closed fracture (HCC) 09/05/2022   Orthostatic hypotension 07/12/2022   Diarrhea 07/10/2022   COVID-19 virus infection 07/08/2022   Electrolyte abnormality 07/08/2022   Malnutrition of moderate degree 07/08/2022   Acute delirium 07/08/2022   History of recurrent UTI (urinary tract infection) 01/30/2022    Constipation    Elevated brain natriuretic peptide (BNP) level 10/15/2021   Generalized weakness 10/15/2021   Chronic kidney disease, stage 3b (HCC) 10/15/2021   Normal pressure hydrocephalus (HCC) 10/15/2021   Dementia without behavioral disturbance (HCC) 10/15/2021   Hypothyroidism 10/15/2021   Essential hypertension 10/15/2021   Overactive bladder 10/15/2021   Moderate mitral regurgitation 01/13/2021   Status post placement of cardiac pacemaker 01/13/2021   CHB (complete heart block) (HCC) 01/07/2021   Chronic systolic CHF (congestive heart failure) (HCC) 12/17/2020   Postmenopausal osteoporosis 08/26/2016   Mixed Alzheimer's and vascular dementia (HCC) 06/13/2014    ONSET DATE: getting worse over the last year   REFERRING DIAG:  Diagnosis  M51.369 (ICD-10-CM) - Degeneration of lumbar intervertebral disc    THERAPY DIAG:   No diagnosis found.   Rationale for Evaluation and Treatment: Rehabilitation  SUBJECTIVE:  SUBJECTIVE STATEMENT:   Pt caregiver reports she did not do well with activity over the weekend and she refused to to pretty much anything.    From Eval:  Hx given from care giver; [name] .  Hx of L femur fx roughly a year and half ago. Hx of lumbar stenosis and BLE weakness. States that they want PT to address pain and weakness to allow continued mobility as tolerated. Care giver reports that she well complain of pain/numbness with mobility, but more willing to attemept to mobilize if "its something she wants"  Will self propel in [facility] at home  Pt accompanied by:  care giver   PERTINENT HISTORY:   From recent Neurology visit from MD.  "1. Mixed dementia (Alzheimer's disease + vascular dementia) with significant ventriculomegaly (with negative lumbar drain trial in 2015) in a  patient with history of sepsis secondary to UTI treated with trimethoprim January 2023. Medication appears to help with UTI and improved mental status - reports good control with current medication - Continue Donepezil 10 mg by mouth once a day  - Continue Memantine 10 mg by mouth two times a day   - Continue keeping patient involved in daily activities, such as reading out loud, helping prepare meals, picking out clothes.  - Recommend patient does not live alone. Patient should have 24/7 supervision.   2. Right leg pain with inability to pick up feet (right>left - likely from ventriculomegaly + peripheral neuropathy) patient with history of lumbar disc disease with stenosis - reports improvement during recent trial of prednisone. Suspicious for lumbar Disc Disease in addition to decondition Reviewed PCP note: Ongoing leg pain with intermittent weakness. On exam she does have some swelling around the right knee. Unsure if she could have some severe arthritis catching as she walks. Will obtain x-ray of the right knee today. Trial of prednisone 20 mg once a day for 5 days. We did discuss that she has some degenerative lumbar disc disease with stenosis and likely playing a role in right leg weakness. She has a pacemaker and we will defer MRI of the lumbar spine. Keep follow-up with neurology and if they feel it is necessary they could possibly pursue nerve conduction study. Inform caretaker to help patient and stay by her side when walking.   - Patient says that her legs feel "heavy" and that she has difficulty initiating the first step when walking - consistent with magnetic gait in patient with hydrocephalus and brain atrophy - Previous (-) Lumbar Puncture trial at [facility] (01/09/14)."   PAIN:  Are you having pain? No  PRECAUTIONS: Fall  RED FLAGS: None   WEIGHT BEARING RESTRICTIONS: No  FALLS: Has patient fallen in last 6 months? Yes. Number of falls 2-3 controlled falls with assist to floor.     LIVING ENVIRONMENT: Lives with:  caregiver 7 days a week. Alone for an hour or 2 a day, but only when alseep.   Lives in: House/apartment Stairs: Yes: Internal: 1 but ramp in place steps; none and External: 1 steps; none Has following equipment at home: Walker - 2 wheeled  PLOF: Requires assistive device for independence, Needs assistance with ADLs, Needs assistance with homemaking, Needs assistance with gait, and Needs assistance with transfers  PATIENT GOALS: get stronger so she can "go and do"   OBJECTIVE:  Note: Objective measures were completed at Evaluation unless otherwise noted.  DIAGNOSTIC FINDINGS:   EXAM: CT LUMBAR SPINE WITHOUT CONTRAST IMPRESSION: 1. Transitional anatomy. Adhering to convention from  the prior lumbar spine MRI, the last fully formed disc space is labeled L5-S1. 2. Unchanged chronic compression fractures of L4 and L5. No new fracture of the lumbar spine. 3. Unchanged lumbar spondylosis notable for moderate narrowing of the right lateral recess at L1-2 and L2-3, as well as mild spinal canal stenosis at L2-3 and L3-4.   COGNITION: Overall cognitive status: History of cognitive impairments - at baseline   SENSATION: WFL  COORDINATION: Decreased speed of movement. Decreased ability to follow instructions with MMT   EDEMA:  Mild BLE distal edema   MUSCLE TONE: WFL  MUSCLE LENGTH: Generalized tightness from sedentary   DTRs:  Not assessed   POSTURE: rounded shoulders, forward head, increased lumbar lordosis, and increased thoracic kyphosis  LOWER EXTREMITY ROM:     Unable formally assess due to    LOWER EXTREMITY MMT:    MMT Right Eval Left Eval  Difficult to assess due to cognitive impairments, grossly 3+/5 to 4/5 proximal to distal with functional movement    BED MOBILITY:  To be assessed at later visit.   TRANSFERS: Assistive device utilized: Environmental consultant - 2 wheeled  Sit to stand: Mod A Stand to sit: Mod A Chair to chair: Mod  A Floor:  unable to complete without total A per care giver   RAMP:  Level of Assistance: Mod A Assistive device utilized: Environmental consultant - 2 wheeled Ramp Comments: caregiver reports use of RW to step down in to home entry   CURB:  Level of Assistance:  not assessed at eval  Assistive device utilized:  rails or RW  Curb Comments: care giver reports use of RW   STAIRS: Level of Assistance:  not assessed at [employer] Technique: Step to Pattern with Bilateral Rails Number of Stairs:   Height of Stairs:   Comments: no use of stairs at home for >1.5 years   GAIT: Gait pattern: decreased step length- Right, decreased stance time- Left, decreased hip/knee flexion- Right, knee flexed in stance- Right, knee flexed in stance- Left, and poor foot clearance- Right Distance walked: 20 Assistive device utilized: Walker - 2 wheeled Level of assistance: Min A and Mod A Comments: R foot drag with TUG. Increased flexed knee with fatigue.   FUNCTIONAL TESTS:  5 times sit to stand: 54 Timed up and go (TUG): 2:24 with min-mod assost  10 meter walk test: TBD   PATIENT SURVEYS:  PsFS: 4.5.   Getting into and out of bed: 0  Standing erect with walking: 5 Walking to bathroom: 4                                                                                                                               TREATMENT DATE:   01/10/2024  Pt arrives to session in transport chair. Unless otherwise stated, min A was provided and gait belt donned in order to ensure pt safety throughout.  Sit>stand transport chair>RW with min A  and R stand pivot from transport chair>EOM using RW with min A. External cue with stepping to accomplish task   STS x 5 min A  Standing tolerance task using RW support and 2 Blaze Pods at top of mirror to promote upright posture light progressed to standard min A for balance and pt stood for 2 min 45 sec ( sits down due to fatigue, 4 min was goal) Alternating R and L UE    Activity Description: standing step tap to blaze pods on 2 in step  Activity Setting:  random  Number of Pods:  2 Cycles/Sets:  1 Duration (Time or Hit Count):  1 min - heavy post lateral lean, difficulty properly bearing weight through LE for appropriate weigh shift.  - removed step and performed another round, pt transitions forward throughout and requires cues to stay in proper location in walker   Transition practice ( stand pivot) - walked pt through proper walker use ( turn walker a little, then pivot with feet a little until [place] is lines up with target destination) x 3 rounds, heavy min A and cues throughout, pt gets basic principle and improved efficacy with repetition   Gait training 20 ft, pt stops due to fatigue. PT assists with weight shift and R LE propulsion, WC follow   PATIENT EDUCATION: Education details: POC. HEP to improve generalized strength and activity tolerance  Person educated: Patient and Caregiver   Education method: Medical illustrator Education comprehension: verbalized understanding  HOME EXERCISE PROGRAM:  Access Code: [email or web address] URL: [email or web address] Date: 12/20/2023 Prepared by: Casimiro Needle  Exercises - Sit to Stand with Armchair  - 1 x daily - 7 x weekly - 3 sets - 5 reps - Standing March with Counter Support  - 1 x daily - 7 x weekly - 2 sets - 10 reps - Supine Bridge  - 1 x daily - 7 x weekly - 2 sets - 10 reps - Supine Heel Slide with Strap  - 1 x daily - 7 x weekly - 2 sets - 10 reps - Supine Hip Abduction AROM  - 1 x daily - 7 x weekly - 3 sets - 10 reps - Seated Long Arc Quad  - 1 x daily - 7 x weekly - 3 sets - 10 reps - Seated March  - 1 x daily - 7 x weekly - 3 sets - 10 reps - 2 hold - Seated Hip Abduction with Resistance  - 1 x daily - 7 x weekly - 2 sets - 10 reps    GOALS: Goals reviewed with patient? Yes   SHORT TERM GOALS: Target date: 12/15/2023   Patient will be independent in home  exercise program to improve strength/mobility for better functional independence with ADLs. Baseline: provided on 11/16/23 12/20/2023: Updated  Goal status: IN PROGRESS   LONG TERM GOALS: Target date: 02/09/2024    Patient will increase PSFS score to equal to or greater than and average of 2 points   to demonstrate statistically significant improvement in mobility and quality of life.  Baseline: initial eval: 3.0 (corrected score) 12/28/2023: 3.33 Goal status: IN PROGRESS  2.  Patient (> 32 years old) will complete five times sit to stand test in < 30 seconds indicating an increased LE strength and improved balance. Baseline: 54 sec with UE support and RW  12/28/2023: 42.46 seconds with UE support and RW with min/mod A from pt's transport chair Goal status: IN PROGRESS   3.  Patient will increase  10 meter walk test by 0.51m/s as to improve gait speed for better community ambulation and to reduce fall risk. Baseline: 12/05/23: pt currently requires min physical assistance and is now able to walk far enough to complete this test at next visit 12/28/2023: 0.08 m/s ( and 2 seconds) using RW with skilled heavy min/mod A for balance Goal status: IN PROGRESS  4.  Patient will reduce timed up and go to <60 sec seconds to reduce fall risk and demonstrate improved transfer/gait ability. Baseline: 2:24 min with RW and min-mod assist  12/28/2023: 17min15sec using RW with min/mod A Goal status: IN PROGRESS     ASSESSMENT:  CLINICAL IMPRESSION:    Patient presents with fair motivation to complete physical therapy activities with this reports she is tired this date and throughout the session is asking if this therapy is over yet.  Patient challenged with focus on transitions this date with multiple sit and stand pivot transfers from various surfaces including mat table to transport chair and mat table to and from standard armchair.  Patient requires cues for safety to ensure she is at her target  destination.  Will continue to encourage patient to complete home exercises as her caregiver reports she was unable to arouse her to do any of the activities over the weekend. Pt will continue to benefit from skilled physical therapy intervention to address impairments, improve QOL, and attain therapy goals.     OBJECTIVE IMPAIRMENTS: Abnormal gait, decreased activity tolerance, decreased balance, decreased cognition, decreased coordination, decreased endurance, decreased knowledge of condition, decreased mobility, difficulty walking, decreased ROM, decreased strength, decreased safety awareness, hypomobility, impaired flexibility, impaired vision/preception, improper body mechanics, postural dysfunction, and pain.   ACTIVITY LIMITATIONS: carrying, lifting, bending, standing, squatting, stairs, transfers, bed mobility, continence, bathing, toileting, dressing, hygiene/grooming, and locomotion level  PARTICIPATION LIMITATIONS: cleaning, laundry, shopping, and community activity  PERSONAL FACTORS: Age, Behavior pattern, Fitness, and 3+ comorbidities: HTN, L femur fx, dimentia  are also affecting patient's functional outcome.   REHAB POTENTIAL: Good  CLINICAL DECISION MAKING: Evolving/moderate complexity  EVALUATION COMPLEXITY: Moderate  PLAN:  PT FREQUENCY: 1-2x/week  PT DURATION: 12 weeks  PLANNED INTERVENTIONS: 97164- PT Re-evaluation, 97110-Therapeutic exercises, 97530- Therapeutic activity, 97112- Neuromuscular re-education, 97535- Self Care, [id number]- Manual therapy, [phone]- Gait training, Balance training, Stair training, Visual/preceptual remediation/compensation, Cognitive remediation, DME instructions, Wheelchair mobility training, Cryotherapy, and Moist heat  PLAN FOR NEXT SESSION:  - L hip strengthening for improved stance control  - bridges - sit>stand training with Blaze Pod sequence to promote increased anterior lean/weight shift while rising to stand - standing upright posture  training with overhead reaching (can stand 64min30sec as of 01/04/2024) - gait training using RW, goal of achieving >63ft consistently - follow-up on HEP - incorporate L UE into activities - dual-cognitive tasks with above interventions - continue side stepping and backwards stepping (in // bars?) over targets   Overall: Activity tolerance, standing tolerance and balance, gait training. Transfer training (sits before turning fully).    Norman Herrlich, PT, DPT  Physical Therapist - [place]  [facility]  3:47 PM 01/10/24

## 2024-01-11 ENCOUNTER — Ambulatory Visit: Payer: Medicare Other | Admitting: Physical Therapy

## 2024-01-11 DIAGNOSIS — R278 Other lack of coordination: Secondary | ICD-10-CM

## 2024-01-11 DIAGNOSIS — R5381 Other malaise: Secondary | ICD-10-CM

## 2024-01-11 DIAGNOSIS — M6281 Muscle weakness (generalized): Secondary | ICD-10-CM | POA: Diagnosis not present

## 2024-01-11 DIAGNOSIS — R262 Difficulty in walking, not elsewhere classified: Secondary | ICD-10-CM

## 2024-01-11 DIAGNOSIS — R269 Unspecified abnormalities of gait and mobility: Secondary | ICD-10-CM

## 2024-01-11 NOTE — Therapy (Signed)
 OUTPATIENT PHYSICAL THERAPY NEURO TREATMENT    Patient Name: Kerri [name] MRN: [id number] DOB:07-Jun-1931, 89 y.o., female Today's Date: 01/11/2024   PCP: Wilford Corner, PA-C  REFERRING PROVIDER: Wilford Corner, PA-C   END OF SESSION:   PT End of Session - 01/11/24 1448     Visit Number 14    Number of Visits 24    Date for PT Re-Evaluation 02/08/24    Progress Note Due on Visit 20    PT Start Time 1448    PT Stop Time 1537    PT Time Calculation (min) 49 min    Equipment Utilized During Treatment Gait belt    Activity Tolerance Patient tolerated treatment well;No increased pain    Behavior During Therapy WFL for tasks assessed/performed                 Past Medical History:  Diagnosis Date   Arthritis    Past Surgical History:  Procedure Laterality Date   INTRAMEDULLARY (IM) NAIL INTERTROCHANTERIC Left 09/06/2022   Procedure: INTRAMEDULLARY (IM) NAIL INTERTROCHANTERIC;  Surgeon: Signa Kell, MD;  Location: [facility] OR[facility];  Service: Orthopedics;  Laterality: Left;   PACEMAKER LEADLESS INSERTION N/A 01/07/2021   Procedure: PACEMAKER LEADLESS INSERTION;  Surgeon: Marcina Millard, MD;  Location: [facility];  Service: Cardiovascular;  Laterality: N/A;   PPM GENERATOR REMOVAL N/A 01/07/2021   Procedure: PPM GENERATOR REMOVAL;  Surgeon: Marcina Millard, MD;  Location: [facility];  Service: Cardiovascular;  Laterality: N/A;   Patient Active Problem List   Diagnosis Date Noted   Acute postoperative anemia due to expected blood loss 09/07/2022   Displaced intertrochanteric fracture of left femur, initial encounter for closed fracture (HCC) 09/05/2022   Orthostatic hypotension 07/12/2022   Diarrhea 07/10/2022   COVID-19 virus infection 07/08/2022   Electrolyte abnormality 07/08/2022   Malnutrition of moderate degree 07/08/2022   Acute delirium 07/08/2022   History of recurrent UTI (urinary tract infection) 01/30/2022    Constipation    Elevated brain natriuretic peptide (BNP) level 10/15/2021   Generalized weakness 10/15/2021   Chronic kidney disease, stage 3b (HCC) 10/15/2021   Normal pressure hydrocephalus (HCC) 10/15/2021   Dementia without behavioral disturbance (HCC) 10/15/2021   Hypothyroidism 10/15/2021   Essential hypertension 10/15/2021   Overactive bladder 10/15/2021   Moderate mitral regurgitation 01/13/2021   Status post placement of cardiac pacemaker 01/13/2021   CHB (complete heart block) (HCC) 01/07/2021   Chronic systolic CHF (congestive heart failure) (HCC) 12/17/2020   Postmenopausal osteoporosis 08/26/2016   Mixed Alzheimer's and vascular dementia (HCC) 06/13/2014    ONSET DATE: getting worse over the last year   REFERRING DIAG:  Diagnosis  M51.369 (ICD-10-CM) - Degeneration of lumbar intervertebral disc    THERAPY DIAG:   Muscle weakness (generalized)  Difficulty in walking, not elsewhere classified  Abnormality of gait and mobility  Debility  Other lack of coordination   Rationale for Evaluation and Treatment: Rehabilitation  SUBJECTIVE:  SUBJECTIVE STATEMENT:  Pt reports she is doing "okay." States she has generalized pain all over, but not limiting her participation in therapy.  Caregiver reports she can tell an improvement in pt's mobility in the afternoons after therapy as well as the day after therapy; however, when pt has to go ~5days between Wednesday to Monday therapy appointments, she can tell the change in the patient. Discussed changing pt's POC to increase frequency of visits to promote better carryover and longer term improvements.  From Eval:  Hx given from care giver; [name] .  Hx of L femur fx roughly a year and half ago. Hx of lumbar stenosis and BLE weakness. States  that they want PT to address pain and weakness to allow continued mobility as tolerated. Care giver reports that she well complain of pain/numbness with mobility, but more willing to attemept to mobilize if "its something she wants"  [facility] self propel in [facility] at [facility]  Pt accompanied by:  care giver   PERTINENT HISTORY:   From recent Neurology visit from MD.  "1. Mixed dementia (Alzheimer's disease + vascular dementia) with significant ventriculomegaly (with negative lumbar drain trial in 2015) in a patient with history of sepsis secondary to UTI treated with trimethoprim January 2023. Medication appears to help with UTI and improved mental status - reports good control with current medication - Continue Donepezil 10 mg by mouth once a day  - Continue Memantine 10 mg by mouth two times a day   - Continue keeping patient involved in daily activities, such as reading out loud, helping prepare meals, picking out clothes.  - Recommend patient does not live alone. Patient should have 24/7 supervision.   2. Right leg pain with inability to pick up feet (right>left - likely from ventriculomegaly + peripheral neuropathy) patient with history of lumbar disc disease with stenosis - reports improvement during recent trial of prednisone. Suspicious for lumbar Disc Disease in addition to decondition Reviewed PCP note: Ongoing leg pain with intermittent weakness. On exam she does have some swelling around the right knee. Unsure if she could have some severe arthritis catching as she walks. Will obtain x-ray of the right knee today. Trial of prednisone 20 mg once a day for 5 days. We did discuss that she has some degenerative lumbar disc disease with stenosis and likely playing a role in right leg weakness. She has a pacemaker and we will defer MRI of the lumbar spine. Keep follow-up with neurology and if they feel it is necessary they could possibly pursue nerve conduction study. Inform caretaker to help patient and  stay by her side when walking.   - Patient says that her legs feel "heavy" and that she has difficulty initiating the first step when walking - consistent with magnetic gait in patient with hydrocephalus and brain atrophy - Previous (-) Lumbar Puncture trial at [facility] (01/09/14)."   PAIN:  Are you having pain? No  PRECAUTIONS: Fall  RED FLAGS: None   WEIGHT BEARING RESTRICTIONS: No  FALLS: Has patient fallen in last 6 months? Yes. Number of falls 2-3 controlled falls with assist to floor.    LIVING ENVIRONMENT: Lives with:  caregiver 7 days a week. Alone for an hour or 2 a day, but only when alseep.   Lives in: House/apartment Stairs: Yes: Internal: 1 but ramp in place steps; none and External: 1 steps; none Has following equipment at home: Dan Humphreys - 2 wheeled  PLOF: Requires assistive device for independence, Needs assistance with ADLs,  Needs assistance with homemaking, Needs assistance with gait, and Needs assistance with transfers  PATIENT GOALS: get stronger so she can "go and do"   OBJECTIVE:  Note: Objective measures were completed at Evaluation unless otherwise noted.  DIAGNOSTIC FINDINGS:   EXAM: CT LUMBAR SPINE WITHOUT CONTRAST IMPRESSION: 1. Transitional anatomy. Adhering to convention from the prior lumbar spine MRI, the last fully formed disc space is labeled L5-S1. 2. Unchanged chronic compression fractures of L4 and L5. No new fracture of the lumbar spine. 3. Unchanged lumbar spondylosis notable for moderate narrowing of the right lateral recess at L1-2 and L2-3, as well as mild spinal canal stenosis at L2-3 and L3-4.   COGNITION: Overall cognitive status: History of cognitive impairments - at baseline   SENSATION: WFL  COORDINATION: Decreased speed of movement. Decreased ability to follow instructions with MMT   EDEMA:  Mild BLE distal edema   MUSCLE TONE: WFL  MUSCLE LENGTH: Generalized tightness from sedentary   DTRs:  Not assessed    POSTURE: rounded shoulders, forward head, increased lumbar lordosis, and increased thoracic kyphosis  LOWER EXTREMITY ROM:     Unable formally assess due to    LOWER EXTREMITY MMT:    MMT Right Eval Left Eval  Difficult to assess due to cognitive impairments, grossly 3+/5 to 4/5 proximal to distal with functional movement    BED MOBILITY:  To be assessed at later visit.   TRANSFERS: Assistive device utilized: Environmental consultant - 2 wheeled  Sit to stand: Mod A Stand to sit: Mod A Chair to chair: Mod A Floor:  unable to complete without total A per care giver   RAMP:  Level of Assistance: Mod A Assistive device utilized: Environmental consultant - 2 wheeled Ramp Comments: caregiver reports use of RW to step down in to home entry   CURB:  Level of Assistance:  not assessed at eval  Assistive device utilized:  rails or RW  Curb Comments: care giver reports use of RW   STAIRS: Level of Assistance:  not assessed at [employer] Technique: Step to Pattern with Bilateral Rails Number of Stairs:   Height of Stairs:   Comments: no use of stairs at home for >1.5 years   GAIT: Gait pattern: decreased step length- Right, decreased stance time- Left, decreased hip/knee flexion- Right, knee flexed in stance- Right, knee flexed in stance- Left, and poor foot clearance- Right Distance walked: 20 Assistive device utilized: Walker - 2 wheeled Level of assistance: Min A and Mod A Comments: R foot drag with TUG. Increased flexed knee with fatigue.   FUNCTIONAL TESTS:  5 times sit to stand: 54 Timed up and go (TUG): 2:24 with min-mod assost  10 meter walk test: TBD   PATIENT SURVEYS:  PsFS: 4.5.   Getting into and out of bed: 0  Standing erect with walking: 5 Walking to bathroom: 4  TREATMENT DATE:   01/11/2024  Pt arrives to session in transport chair. Unless otherwise  stated, min A was provided and gait belt donned in order to ensure pt safety throughout.  Repeated sit>stands x10reps from transport chair>RW with mod A progressed to min A during dual-task of placing 10 numbered post-it notes on mirror - pt continues to lack adequate anterior trunk lean to initiate coming to stand with posterior lean bias. Pt does better when therapist places the card directly anterior to her on mirror to promote anterior lean/reach prior to standing to place post-it note higher on mirror.  Progressed to standing for 2 min during dual-task challenge of placing numbered post-it notes in order (1-10) with min A progressed to lighter min A for balance - continues to start with a posterior lean bias that improves with cuing. Pt with improving ability to dual-task and correct her gradual "sinking" into a crouched posture with decreased cuing from thearpist, until she becomes more fatigued.  Pt reports she is tired today.   Gait training 43ft + 39ft (seated break) using RW with skilled min assist for balance and +2 w/c follow. Therapist continuing to provide min facilitation for weight shifting - mod verbal/visual cuing for increased step lengths bilaterally with pt having poor foot clearance, small steps, and partial crouched posture throughout with excessive trunk/hip flexion. Continues to have minor R lateral trunk lean that becomes more prominent with fatigue.   PATIENT EDUCATION: Education details: POC. HEP to improve generalized strength and activity tolerance  Person educated: Patient and Caregiver   Education method: Medical illustrator Education comprehension: verbalized understanding  HOME EXERCISE PROGRAM:  Access Code: [email or web address] URL: [email or web address] Date: 12/20/2023 Prepared by: Casimiro Needle  Exercises - Sit to Stand with Armchair  - 1 x daily - 7 x weekly - 3 sets - 5 reps - Standing March with Counter Support  - 1 x daily - 7 x weekly -  2 sets - 10 reps - Supine Bridge  - 1 x daily - 7 x weekly - 2 sets - 10 reps - Supine Heel Slide with Strap  - 1 x daily - 7 x weekly - 2 sets - 10 reps - Supine Hip Abduction AROM  - 1 x daily - 7 x weekly - 3 sets - 10 reps - Seated Long Arc Quad  - 1 x daily - 7 x weekly - 3 sets - 10 reps - Seated March  - 1 x daily - 7 x weekly - 3 sets - 10 reps - 2 hold - Seated Hip Abduction with Resistance  - 1 x daily - 7 x weekly - 2 sets - 10 reps    GOALS: Goals reviewed with patient? Yes   SHORT TERM GOALS: Target date: 12/15/2023   Patient will be independent in home exercise program to improve strength/mobility for better functional independence with ADLs. Baseline: provided on 11/16/23 12/20/2023: Updated  Goal status: IN PROGRESS   LONG TERM GOALS: Target date: 02/09/2024    Patient will increase PSFS score to equal to or greater than and average of 2 points   to demonstrate statistically significant improvement in mobility and quality of life.  Baseline: initial eval: 3.0 (corrected score) 12/28/2023: 3.33 Goal status: IN PROGRESS  2.  Patient (> 49 years old) will complete five times sit to stand test in < 30 seconds indicating an increased LE strength and improved balance. Baseline: 54 sec with UE support and RW  12/28/2023: 42.46 seconds with  UE support and RW with min/mod A from pt's transport chair Goal status: IN PROGRESS   3.  Patient will increase 10 meter walk test by 0.31m/s as to improve gait speed for better community ambulation and to reduce fall risk. Baseline: 12/05/23: pt currently requires min physical assistance and is now able to walk far enough to complete this test at next visit 12/28/2023: 0.08 m/s ( and 2 seconds) using RW with skilled heavy min/mod A for balance Goal status: IN PROGRESS  4.  Patient will reduce timed up and go to <60 sec seconds to reduce fall risk and demonstrate improved transfer/gait ability. Baseline: 2:24 min with RW and min-mod  assist  12/28/2023: 4min15sec using RW with min/mod A Goal status: IN PROGRESS     ASSESSMENT:  CLINICAL IMPRESSION:   Patient is a 89y.o. female who was seen today for physical therapy treatment for impaired balance, significant generalized and B LE weakness, impaired endurance, impaired motor planning, impaired bed mobility, impaired transfers, and gait deficits due to multiple comorbidities including Alzheimer's disease + vascular dementia. Pt highly motivated to participate in therapy session despite reports of fatigue. Pt enjoys having a dual-task challenge with external targets during interventions. Therapy session focused on sit<>stand training with external target to promote full upright posture with pt able to complete this 10 times today from the transport chair height. Pt performed standing for 2 minutes while performing a dual-task challenge to promote full upright posture. Pt participated in gait training reaching 9ft with skilled min A! Overall, pt requires min A (more consistently mod A with fatigue) for basic functional mobility tasks including supine<>sit, sit<>stands, stand pivot transfers, and gait training using RW; however, caregiver reports noticeable improvement in pt's mobility following therapy. Ms. Mow will benefit from continued skilled PT to address strength and balance impairments, to improve safety with bed mobility, transfers, gait and allow improvement in overall QoL and reduce care giver burden.     OBJECTIVE IMPAIRMENTS: Abnormal gait, decreased activity tolerance, decreased balance, decreased cognition, decreased coordination, decreased endurance, decreased knowledge of condition, decreased mobility, difficulty walking, decreased ROM, decreased strength, decreased safety awareness, hypomobility, impaired flexibility, impaired vision/preception, improper body mechanics, postural dysfunction, and pain.   ACTIVITY LIMITATIONS: carrying, lifting, bending, standing,  squatting, stairs, transfers, bed mobility, continence, bathing, toileting, dressing, hygiene/grooming, and locomotion level  PARTICIPATION LIMITATIONS: cleaning, laundry, shopping, and community activity  PERSONAL FACTORS: Age, Behavior pattern, Fitness, and 3+ comorbidities: HTN, L femur fx, dimentia  are also affecting patient's functional outcome.   REHAB POTENTIAL: Good  CLINICAL DECISION MAKING: Evolving/moderate complexity  EVALUATION COMPLEXITY: Moderate  PLAN:  PT FREQUENCY: 1-2x/week  PT DURATION: 12 weeks  PLANNED INTERVENTIONS: 97164- PT Re-evaluation, 97110-Therapeutic exercises, 97530- Therapeutic activity, 97112- Neuromuscular re-education, 97535- Self Care, [id number]- Manual therapy, [phone]- Gait training, Balance training, Stair training, Visual/preceptual remediation/compensation, Cognitive remediation, DME instructions, Wheelchair mobility training, Cryotherapy, and Moist heat  PLAN FOR NEXT SESSION:  - L hip strengthening for improved stance control  - bridges - sit>stand training with Blaze Pod sequence to promote increased anterior lean/weight shift while rising to stand - standing upright posture training with overhead reaching (can stand 56min30sec as of 01/04/2024) - gait training using RW, goal of achieving >14ft consistently - follow-up on HEP - incorporate L UE into activities - dual-cognitive tasks with above interventions - continue side stepping and backwards stepping (in // bars?) over targets   Overall: Activity tolerance, standing tolerance and balance, gait training. Transfer training (sits before turning fully).  Ginny Forth, PT, DPT  Physical Therapist - [place]  [facility]  4:16 PM 01/11/24

## 2024-01-12 ENCOUNTER — Other Ambulatory Visit: Payer: Self-pay

## 2024-01-12 ENCOUNTER — Encounter: Payer: Medicare Other | Admitting: Physical Therapy

## 2024-01-12 ENCOUNTER — Emergency Department (HOSPITAL_COMMUNITY)

## 2024-01-12 ENCOUNTER — Encounter (HOSPITAL_COMMUNITY): Payer: Self-pay

## 2024-01-12 ENCOUNTER — Ambulatory Visit (INDEPENDENT_AMBULATORY_CARE_PROVIDER_SITE_OTHER): Admitting: Physician Assistant

## 2024-01-12 ENCOUNTER — Inpatient Hospital Stay (HOSPITAL_COMMUNITY)
Admission: EM | Admit: 2024-01-12 | Discharge: 2024-01-17 | DRG: 683 | Disposition: A | Discharge status: Home Health | Attending: Internal Medicine | Admitting: Internal Medicine

## 2024-01-12 VITALS — BP 132/83 | HR 61

## 2024-01-12 DIAGNOSIS — I5022 Chronic systolic (congestive) heart failure: Secondary | ICD-10-CM | POA: Diagnosis present

## 2024-01-12 DIAGNOSIS — R531 Weakness: Secondary | ICD-10-CM

## 2024-01-12 DIAGNOSIS — Z79899 Other long term (current) drug therapy: Secondary | ICD-10-CM | POA: Diagnosis not present

## 2024-01-12 DIAGNOSIS — N184 Chronic kidney disease, stage 4 (severe): Secondary | ICD-10-CM

## 2024-01-12 DIAGNOSIS — I10 Essential (primary) hypertension: Secondary | ICD-10-CM | POA: Diagnosis present

## 2024-01-12 DIAGNOSIS — M4856XA Collapsed vertebra, not elsewhere classified, lumbar region, initial encounter for fracture: Secondary | ICD-10-CM | POA: Diagnosis present

## 2024-01-12 DIAGNOSIS — Z87440 Personal history of urinary (tract) infections: Secondary | ICD-10-CM | POA: Diagnosis not present

## 2024-01-12 DIAGNOSIS — F0150 Vascular dementia without behavioral disturbance: Secondary | ICD-10-CM | POA: Diagnosis present

## 2024-01-12 DIAGNOSIS — N179 Acute kidney failure, unspecified: Secondary | ICD-10-CM | POA: Diagnosis present

## 2024-01-12 DIAGNOSIS — D1803 Hemangioma of intra-abdominal structures: Secondary | ICD-10-CM | POA: Diagnosis present

## 2024-01-12 DIAGNOSIS — Z79890 Hormone replacement therapy (postmenopausal): Secondary | ICD-10-CM | POA: Diagnosis not present

## 2024-01-12 DIAGNOSIS — N3000 Acute cystitis without hematuria: Secondary | ICD-10-CM | POA: Diagnosis not present

## 2024-01-12 DIAGNOSIS — E8809 Other disorders of plasma-protein metabolism, not elsewhere classified: Secondary | ICD-10-CM | POA: Diagnosis present

## 2024-01-12 DIAGNOSIS — M810 Age-related osteoporosis without current pathological fracture: Secondary | ICD-10-CM | POA: Diagnosis present

## 2024-01-12 DIAGNOSIS — Z888 Allergy status to other drugs, medicaments and biological substances status: Secondary | ICD-10-CM | POA: Diagnosis not present

## 2024-01-12 DIAGNOSIS — E860 Dehydration: Secondary | ICD-10-CM | POA: Diagnosis present

## 2024-01-12 DIAGNOSIS — K5730 Diverticulosis of large intestine without perforation or abscess without bleeding: Secondary | ICD-10-CM | POA: Diagnosis present

## 2024-01-12 DIAGNOSIS — Z950 Presence of cardiac pacemaker: Secondary | ICD-10-CM | POA: Diagnosis present

## 2024-01-12 DIAGNOSIS — N1832 Chronic kidney disease, stage 3b: Secondary | ICD-10-CM | POA: Diagnosis present

## 2024-01-12 DIAGNOSIS — I130 Hypertensive heart and chronic kidney disease with heart failure and stage 1 through stage 4 chronic kidney disease, or unspecified chronic kidney disease: Secondary | ICD-10-CM | POA: Diagnosis present

## 2024-01-12 DIAGNOSIS — R8271 Bacteriuria: Secondary | ICD-10-CM

## 2024-01-12 DIAGNOSIS — F0280 Dementia in other diseases classified elsewhere without behavioral disturbance: Secondary | ICD-10-CM | POA: Diagnosis present

## 2024-01-12 DIAGNOSIS — F0390 Unspecified dementia without behavioral disturbance: Secondary | ICD-10-CM | POA: Diagnosis present

## 2024-01-12 DIAGNOSIS — G309 Alzheimer's disease, unspecified: Secondary | ICD-10-CM | POA: Diagnosis present

## 2024-01-12 DIAGNOSIS — E039 Hypothyroidism, unspecified: Secondary | ICD-10-CM | POA: Diagnosis present

## 2024-01-12 DIAGNOSIS — N390 Urinary tract infection, site not specified: Principal | ICD-10-CM | POA: Diagnosis present

## 2024-01-12 HISTORY — DX: Essential (primary) hypertension: I10

## 2024-01-12 HISTORY — DX: Hypothyroidism, unspecified: E03.9

## 2024-01-12 HISTORY — DX: Overactive bladder: N32.81

## 2024-01-12 HISTORY — DX: Unspecified dementia, unspecified severity, without behavioral disturbance, psychotic disturbance, mood disturbance, and anxiety: F03.90

## 2024-01-12 HISTORY — DX: Chronic systolic (congestive) heart failure: I50.22

## 2024-01-12 HISTORY — DX: Chronic kidney disease, stage 3 unspecified: N18.30

## 2024-01-12 LAB — CBC WITH DIFFERENTIAL/PLATELET
Abs Immature Granulocytes: 0.09 10*3/uL — ABNORMAL HIGH (ref 0.00–0.07)
Basophils Absolute: 0.1 10*3/uL (ref 0.0–0.1)
Basophils Relative: 0 %
Eosinophils Absolute: 0.2 10*3/uL (ref 0.0–0.5)
Eosinophils Relative: 1 %
HCT: 44.1 % (ref 36.0–46.0)
Hemoglobin: 14 g/dL (ref 12.0–15.0)
Immature Granulocytes: 1 %
Lymphocytes Relative: 18 %
Lymphs Abs: 2.5 10*3/uL (ref 0.7–4.0)
MCH: 28.8 pg (ref 26.0–34.0)
MCHC: 31.7 g/dL (ref 30.0–36.0)
MCV: 90.7 fL (ref 80.0–100.0)
Monocytes Absolute: 1 10*3/uL (ref 0.1–1.0)
Monocytes Relative: 7 %
Neutro Abs: 10.3 10*3/uL — ABNORMAL HIGH (ref 1.7–7.7)
Neutrophils Relative %: 73 %
Platelets: 373 10*3/uL (ref 150–400)
RBC: 4.86 MIL/uL (ref 3.87–5.11)
RDW: 14.1 % (ref 11.5–15.5)
WBC: 14.1 10*3/uL — ABNORMAL HIGH (ref 4.0–10.5)
nRBC: 0 % (ref 0.0–0.2)

## 2024-01-12 LAB — COMPREHENSIVE METABOLIC PANEL WITH GFR
ALT: 11 U/L (ref 0–44)
AST: 16 U/L (ref 15–41)
Albumin: 3.6 g/dL (ref 3.5–5.0)
Alkaline Phosphatase: 68 U/L (ref 38–126)
Anion gap: 11 (ref 5–15)
BUN: 47 mg/dL — ABNORMAL HIGH (ref 8–23)
CO2: 19 mmol/L — ABNORMAL LOW (ref 22–32)
Calcium: 9.8 mg/dL (ref 8.9–10.3)
Chloride: 113 mmol/L — ABNORMAL HIGH (ref 98–111)
Creatinine, Ser: 2.63 mg/dL — ABNORMAL HIGH (ref 0.44–1.00)
GFR, Estimated: 17 mL/min — ABNORMAL LOW (ref >60)
Glucose, Bld: 119 mg/dL — ABNORMAL HIGH (ref 70–99)
Potassium: 4.5 mmol/L (ref 3.5–5.1)
Sodium: 143 mmol/L (ref 135–145)
Total Bilirubin: 0.6 mg/dL (ref 0.0–1.2)
Total Protein: 8.6 g/dL — ABNORMAL HIGH (ref 6.5–8.1)

## 2024-01-12 LAB — URINALYSIS, ROUTINE W REFLEX MICROSCOPIC
Bilirubin Urine: NEGATIVE
Glucose, UA: NEGATIVE mg/dL
Ketones, ur: NEGATIVE mg/dL
Nitrite: POSITIVE — AB
Protein, ur: 30 mg/dL — AB
Specific Gravity, Urine: 1.011 (ref 1.005–1.030)
WBC, UA: >50 WBC/hpf (ref 0–5)
pH: 5 (ref 5.0–8.0)

## 2024-01-12 MED ORDER — LACTATED RINGERS IV BOLUS
500.0000 mL | Freq: Once | INTRAVENOUS | Status: AC
  Administered 2024-01-13: 500 mL via INTRAVENOUS

## 2024-01-12 MED ORDER — SODIUM CHLORIDE 0.9 % IV SOLN
1.0000 g | Freq: Once | INTRAVENOUS | Status: AC
  Administered 2024-01-12: 1 g via INTRAVENOUS
  Filled 2024-01-12: qty 10

## 2024-01-12 MED ORDER — LACTATED RINGERS IV BOLUS
1000.0000 mL | Freq: Once | INTRAVENOUS | Status: AC
  Administered 2024-01-12: 1000 mL via INTRAVENOUS

## 2024-01-12 NOTE — ED Provider Notes (Signed)
 [place] EMERGENCY DEPARTMENT AT [facility] Provider Note   CSN: [id number] Arrival date & time: 01/12/24  1851     History  Chief Complaint  Patient presents with   Abnormal Labs    Kerri [name] is a 89 y.o. female.  Patient indicates saw urology and was told kidney function increased from baseline, possible uti, possible kidney stone, and to go to ER.  Pt limited historian, hx dementia, level 5 caveat. Pt denies abdominal or flank pain. No vomiting. No fever or chills. Is eating/drinking, possibly less than normal. No dysuria. No fever or chills. No extremity pain or swelling.   The history is provided by the patient, medical records and a relative.       Home Medications Prior to Admission medications   Medication Sig Start Date End Date Taking? Authorizing Provider  acetaminophen (TYLENOL) 325 MG tablet Take 2 tablets (650 mg total) by mouth every 6 (six) hours as needed for mild pain, moderate pain or fever. 10/23/21   Pennie Banter, DO  bisacodyl (DULCOLAX) 10 MG suppository Place 1 suppository (10 mg total) rectally daily as needed for moderate constipation. 09/10/22   Arnetha Courser, MD  chlorhexidine (HIBICLENS) 4 % external liquid Apply topically daily as needed. 06/17/22   Michiel Cowboy A, PA-C  cyanocobalamin (,VITAMIN B-12,) 1000 MCG/ML injection Inject 1,000 mcg into the muscle every 30 (thirty) days. 11/11/21   [provider]  docusate sodium (COLACE) 100 MG capsule Take 1 capsule (100 mg total) by mouth 2 (two) times daily. 09/10/22   Arnetha Courser, MD  donepezil (ARICEPT) 10 MG tablet Take 10 mg by mouth at bedtime.    [provider]  Fe Fum-Vit C-Vit B12-FA (TRIGELS-F FORTE) CAPS capsule Take 1 capsule by mouth 2 (two) times daily. 09/10/22   Arnetha Courser, MD  fluticasone (FLONASE) 50 MCG/ACT nasal spray 2 sprays in both nostrils    [provider]  folic acid (FOLVITE) 1 MG tablet Take 1 mg by mouth daily.    [provider]  levothyroxine (SYNTHROID) 50 MCG tablet Take 50 mcg by mouth daily before breakfast. 11/17/18   [provider]  memantine (NAMENDA) 10 MG tablet Take 10 mg by mouth 2 (two) times daily. 02/19/19   [provider]  metoprolol succinate (TOPROL-XL) 25 MG 24 hr tablet Take 25 mg by mouth daily.    [provider]  Multiple Vitamin (MULTIVITAMIN WITH MINERALS) TABS tablet Take 1 tablet by mouth daily. 09/10/22   Arnetha Courser, MD  polyethylene glycol powder (GLYCOLAX/MIRALAX) 17 GM/SCOOP powder Take 17 g by mouth daily. 10/23/21   [provider]  potassium chloride (KLOR-CON) 10 MEQ tablet Take 10 mEq by mouth once a week. Every Monday    [provider]  QUEtiapine (SEROQUEL) 25 MG tablet Take by mouth. 12/12/23   [provider]  trimethoprim (TRIMPEX) 100 MG tablet TAKE ONE TABLET BY MOUTH EVERY DAY 10/17/23   Marvel Plan, Carollee Herter A, PA-C  Trospium Chloride 60 MG CP24 Take 1 capsule (60 mg total) by mouth daily. 12/03/22   Sondra Come, MD  venlafaxine XR (EFFEXOR-XR) 75 MG 24 hr capsule Take 75 mg by mouth daily with breakfast.  03/09/19   [provider]      Allergies    Alendronate sodium, Bupropion, Rofecoxib, and Alendronate    Review of Systems   Review of Systems  Constitutional:  Negative for fever.  HENT:  Negative for sore throat.  Eyes:  Negative for redness.  Respiratory:  Negative for cough and shortness of breath.   Cardiovascular:  Negative for chest pain and leg swelling.  Gastrointestinal:  Negative for abdominal pain, diarrhea and vomiting.  Genitourinary:  Negative for dysuria and flank pain.  Musculoskeletal:  Negative for back pain and neck pain.  Skin:  Negative for rash.  Neurological:  Negative for headaches.    Physical Exam Updated Vital Signs BP (!) 156/87 (BP Location: Left Arm)   Pulse 77   Temp (!) 97.3 F (36.3 C) (Oral)   Resp 18   Ht 1.727 m (5\' 8" )   SpO2 95%   BMI 25.85  kg/m  Physical Exam Vitals and nursing note reviewed.  Constitutional:      Appearance: Normal appearance. She is well-developed.  HENT:     Head: Atraumatic.     Nose: Nose normal.     Mouth/Throat:     Mouth: Mucous membranes are moist.  Eyes:     General: No scleral icterus.    Conjunctiva/sclera: Conjunctivae normal.  Neck:     Trachea: No tracheal deviation.  Cardiovascular:     Rate and Rhythm: Normal rate and regular rhythm.     Pulses: Normal pulses.     Heart sounds: Normal heart sounds. No murmur heard.    No friction rub. No gallop.  Pulmonary:     Effort: Pulmonary effort is normal. No respiratory distress.     Breath sounds: Normal breath sounds.  Abdominal:     General: Bowel sounds are normal. There is no distension.     Palpations: Abdomen is soft. There is no mass.     Tenderness: There is no abdominal tenderness. There is no guarding.  Genitourinary:    Comments: No cva tenderness.  Musculoskeletal:        General: No swelling or tenderness.     Cervical back: Normal range of motion and neck supple. No rigidity. No muscular tenderness.     Right lower leg: No edema.     Left lower leg: No edema.  Skin:    General: Skin is warm and dry.     Findings: No rash.  Neurological:     Mental Status: She is alert.     Comments: Alert, speech normal.   Psychiatric:        Mood and Affect: Mood normal.     ED Results / Procedures / Treatments   Labs (all labs ordered are listed, but only abnormal results are displayed) Results for orders placed or performed during the hospital encounter of 01/12/24  Comprehensive metabolic panel   Collection Time: 01/12/24  7:37 PM  Result Value Ref Range   Sodium 143 135 - 145 mmol/L   Potassium 4.5 3.5 - 5.1 mmol/L   Chloride 113 (H) 98 - 111 mmol/L   CO2 19 (L) 22 - 32 mmol/L   Glucose, Bld 119 (H) 70 - 99 mg/dL   BUN 47 (H) 8 - 23 mg/dL   Creatinine, Ser 4.13 (H) 0.44 - 1.00 mg/dL   Calcium 9.8 8.9 - 24.4 mg/dL    Total Protein 8.6 (H) 6.5 - 8.1 g/dL   Albumin 3.6 3.5 - 5.0 g/dL   AST 16 15 - 41 U/L   ALT 11 0 - 44 U/L   Alkaline Phosphatase 68 38 - 126 U/L   Total Bilirubin 0.6 0.0 - 1.2 mg/dL   GFR, Estimated 17 (L) >60 mL/min   Anion gap 11 5 -  15  CBC with Differential   Collection Time: 01/12/24  7:37 PM  Result Value Ref Range   WBC 14.1 (H) 4.0 - 10.5 K/uL   RBC 4.86 3.87 - 5.11 MIL/uL   Hemoglobin 14.0 12.0 - 15.0 g/dL   HCT 40.9 81.1 - 91.4 %   MCV 90.7 80.0 - 100.0 fL   MCH 28.8 26.0 - 34.0 pg   MCHC 31.7 30.0 - 36.0 g/dL   RDW 78.2 95.6 - 21.3 %   Platelets 373 150 - 400 K/uL   nRBC 0.0 0.0 - 0.2 %   Neutrophils Relative % 73 %   Neutro Abs 10.3 (H) 1.7 - 7.7 K/uL   Lymphocytes Relative 18 %   Lymphs Abs 2.5 0.7 - 4.0 K/uL   Monocytes Relative 7 %   Monocytes Absolute 1.0 0.1 - 1.0 K/uL   Eosinophils Relative 1 %   Eosinophils Absolute 0.2 0.0 - 0.5 K/uL   Basophils Relative 0 %   Basophils Absolute 0.1 0.0 - 0.1 K/uL   Immature Granulocytes 1 %   Abs Immature Granulocytes 0.09 (H) 0.00 - 0.07 K/uL  Urinalysis, Routine w reflex microscopic -Urine, Clean Catch   Collection Time: 01/12/24  8:38 PM  Result Value Ref Range   Color, Urine YELLOW YELLOW   APPearance HAZY (A) CLEAR   Specific Gravity, Urine 1.011 1.005 - 1.030   pH 5.0 5.0 - 8.0   Glucose, UA NEGATIVE NEGATIVE mg/dL   Hgb urine dipstick SMALL (A) NEGATIVE   Bilirubin Urine NEGATIVE NEGATIVE   Ketones, ur NEGATIVE NEGATIVE mg/dL   Protein, ur 30 (A) NEGATIVE mg/dL   Nitrite POSITIVE (A) NEGATIVE   Leukocytes,Ua LARGE (A) NEGATIVE   RBC / HPF 21-50 0 - 5 RBC/hpf   WBC, UA >50 0 - 5 WBC/hpf   Bacteria, UA MANY (A) NONE SEEN   Squamous Epithelial / HPF 0-5 0 - 5 /HPF   WBC Clumps PRESENT    Mucus PRESENT      EKG None  Radiology CT Renal Stone Study Result Date: 01/12/2024 CLINICAL DATA:  Abdominal and flank pain. EXAM: CT ABDOMEN AND PELVIS WITHOUT CONTRAST TECHNIQUE: Multidetector CT imaging of  the abdomen and pelvis was performed following the standard protocol without IV contrast. RADIATION DOSE REDUCTION: This exam was performed according to the departmental dose-optimization program which includes automated exposure control, adjustment of the mA and/or kV according to patient size and/or use of iterative reconstruction technique. COMPARISON:  CT abdomen and pelvis 05/14/2014 FINDINGS: Lower chest: No acute abnormality. Heart is enlarged. Pacemaker is present. Hepatobiliary: There is an 11 mm hypodensity in the right lobe of the liver most likely related to cyst or hemangioma. Otherwise, the liver, gallbladder and bile ducts are within normal limits. Pancreas: Unremarkable. No pancreatic ductal dilatation or surrounding inflammatory changes. Spleen: Normal in size without focal abnormality. Adrenals/Urinary Tract: There is an 8 mm calculus in the mid left kidney. Otherwise, the kidneys, adrenal glands, and bladder are within normal limits. Stomach/Bowel: Stomach is within normal limits. Appendix appears normal. No evidence of bowel wall thickening, distention, or inflammatory changes. The appendix is not seen. There is diffuse colonic diverticulosis. Vascular/Lymphatic: Aortic atherosclerosis. No enlarged abdominal or pelvic lymph nodes. Reproductive: Status post hysterectomy. No adnexal masses. Other: No abdominal wall hernia or abnormality. No abdominopelvic ascites. Musculoskeletal: The bones are osteopenic. There is chronic appearing compression deformity of L4. Left-sided hip screws and intramedullary nail are present. There are healed bilateral inferior and right superior pubic  rami fractures. IMPRESSION: 1. No acute localizing process in the abdomen or pelvis. 2. Nonobstructing left renal calculus. 3. Colonic diverticulosis without evidence for diverticulitis. 4. Cardiomegaly. 5. Aortic atherosclerosis. Aortic Atherosclerosis (ICD10-I70.0). Electronically Signed   By: Darliss Cheney M.D.   On:  01/12/2024 21:13    Procedures Procedures    Medications Ordered in ED Medications  lactated ringers bolus 1,000 mL (1,000 mLs Intravenous New Bag/Given 01/12/24 1939)    ED Course/ Medical Decision Making/ A&P                                 Medical Decision Making Problems Addressed: Acute UTI: acute illness or injury with systemic symptoms that poses a threat to life or bodily functions AKI (acute kidney injury) [facility]): acute illness or injury with systemic symptoms that poses a threat to life or bodily functions Dehydration: acute illness or injury with systemic symptoms that poses a threat to life or bodily functions  Amount and/or Complexity of Data Reviewed Independent Historian:     Details: Family, hx External Data Reviewed: notes. Labs: ordered. Decision-making details documented in ED Course. Radiology: ordered and independent interpretation performed. Decision-making details documented in ED Course.  Risk Prescription drug management. Decision regarding hospitalization.   Iv ns. Continuous pulse ox and cardiac monitoring. Labs ordered/sent. Imaging ordered.   Differential diagnosis includes dehydration, aki, uti, etc. Dispo decision including potential need for admission considered - will get labs and imaging and reassess.   Reviewed nursing notes and prior charts for additional history. External reports reviewed. Additional history from: family.   LR bolus.   Labs reviewed/interpreted by me - ua c/w uti. Cx sent. Wbc elevated, 14. Aki. Additional ivf.   Rocephin iv.   CT reviewed/interpreted by me - no obstructing stone.   Medicine consulted for admission.           Final Clinical Impression(s) / ED Diagnoses Final diagnoses:  None    Rx / DC Orders ED Discharge Orders     None         Cathren Laine, MD 01/12/24 2131

## 2024-01-12 NOTE — Progress Notes (Signed)
 In and Out Catheterization  Patient is present today for a I & O catheterization due to UTI symptoms. Patient was cleaned and prepped in a sterile fashion with betadine . A 14FR cath was inserted no complications were noted , 60 ml of urine return was noted, urine was cloudy in color. A clean urine sample was collected for UA. Bladder was drained  And catheter was removed with out difficulty.    Performed by: Randa Lynn, RMA

## 2024-01-12 NOTE — Patient Instructions (Addendum)
 I'm sending you to the Emergency Department today due to a significant worsening in your kidney function. Your labs yesterday showed creatinine 2.9 (eGFR 15). This reflects a significant decline from your baseline creatinine of 1.3 (eGFR 39).

## 2024-01-12 NOTE — H&P (Signed)
 History and Physical    Patient: Kerri [name]:[id number] DOB: January 25, 1931 DOA: 01/12/2024 DOS: the patient was seen and examined on 01/12/2024 PCP: Wilford Corner, PA-C  Patient coming from: Home  Chief Complaint:  Chief Complaint  Patient presents with   Abnormal Labs   HPI: Kerri [name] is a 89 y.o. female with medical history significant of hypothyroidism, recurrent UTIs, osteoarthritis, previous intertrochanter fracture, chronic kidney disease stage IIIb, chronic systolic heart failure, dementia, essential hypertension, history of cardiac pacemaker, history of normal pressure hydrocephalus and overactive bladder.  Was brought in with generalized weakness, on suspicion for possible kidney stone.  Patient denied any fever or chills.  Denied any flank pain.  Denied any urinary's symptoms.  Family have noted decreased oral intake.  Patient was worked up in the ER and found to have no renal stones.  She however has AKI on CKD 3 as well as evidence of UTI.  She has been admitted with generalized weakness due to UTI and AKI on CKD 3.  Review of Systems: As mentioned in the history of present illness. All other systems reviewed and are negative. Past Medical History:  Diagnosis Date   Arthritis    Past Surgical History:  Procedure Laterality Date   INTRAMEDULLARY (IM) NAIL INTERTROCHANTERIC Left 09/06/2022   Procedure: INTRAMEDULLARY (IM) NAIL INTERTROCHANTERIC;  Surgeon: Signa Kell, MD;  Location: [facility] ORS;  Service: Orthopedics;  Laterality: Left;   PACEMAKER LEADLESS INSERTION N/A 01/07/2021   Procedure: PACEMAKER LEADLESS INSERTION;  Surgeon: Marcina Millard, MD;  Location: [facility] INVASIVE CV LAB;  Service: Cardiovascular;  Laterality: N/A;   PPM GENERATOR REMOVAL N/A 01/07/2021   Procedure: PPM GENERATOR REMOVAL;  Surgeon: Marcina Millard, MD;  Location: [facility] INVASIVE CV LAB;  Service: Cardiovascular;  Laterality: N/A;   Social History:  reports that she has  never smoked. She has never been exposed to tobacco smoke. She has never used smokeless tobacco. She reports that she does not drink alcohol and does not use drugs.  Allergies  Allergen Reactions   Alendronate Sodium Other (See Comments)   Bupropion Other (See Comments)    Tremors      Rofecoxib Diarrhea   Alendronate Rash    History reviewed. No pertinent family history.  Prior to Admission medications   Medication Sig Start Date End Date Taking? Authorizing Provider  acetaminophen (TYLENOL) 325 MG tablet Take 2 tablets (650 mg total) by mouth every 6 (six) hours as needed for mild pain, moderate pain or fever. 10/23/21   Pennie Banter, DO  bisacodyl (DULCOLAX) 10 MG suppository Place 1 suppository (10 mg total) rectally daily as needed for moderate constipation. 09/10/22   Arnetha Courser, MD  chlorhexidine (HIBICLENS) 4 % external liquid Apply topically daily as needed. 06/17/22   Michiel Cowboy A, PA-C  cyanocobalamin (,VITAMIN B-12,) 1000 MCG/ML injection Inject 1,000 mcg into the muscle every 30 (thirty) days. 11/11/21   [provider]  docusate sodium (COLACE) 100 MG capsule Take 1 capsule (100 mg total) by mouth 2 (two) times daily. 09/10/22   Arnetha Courser, MD  donepezil (ARICEPT) 10 MG tablet Take 10 mg by mouth at bedtime.    [provider]  Fe Fum-Vit C-Vit B12-FA (TRIGELS-F FORTE) CAPS capsule Take 1 capsule by mouth 2 (two) times daily. 09/10/22   Arnetha Courser, MD  fluticasone (FLONASE) 50 MCG/ACT nasal spray 2 sprays in both nostrils    [provider]  folic acid (FOLVITE) 1 MG tablet Take 1 mg by  mouth daily.    [provider]  levothyroxine (SYNTHROID) 50 MCG tablet Take 50 mcg by mouth daily before breakfast. 11/17/18   [provider]  memantine (NAMENDA) 10 MG tablet Take 10 mg by mouth 2 (two) times daily. 02/19/19   [provider]  metoprolol succinate (TOPROL-XL) 25 MG 24 hr tablet Take 25 mg by mouth daily.     [provider]  Multiple Vitamin (MULTIVITAMIN WITH MINERALS) TABS tablet Take 1 tablet by mouth daily. 09/10/22   Arnetha Courser, MD  polyethylene glycol powder (GLYCOLAX/MIRALAX) 17 GM/SCOOP powder Take 17 g by mouth daily. 10/23/21   [provider]  potassium chloride (KLOR-CON) 10 MEQ tablet Take 10 mEq by mouth once a week. Every Monday    [provider]  QUEtiapine (SEROQUEL) 25 MG tablet Take by mouth. 12/12/23   [provider]  trimethoprim (TRIMPEX) 100 MG tablet TAKE ONE TABLET BY MOUTH EVERY DAY 10/17/23   Marvel Plan, Carollee Herter A, PA-C  Trospium Chloride 60 MG CP24 Take 1 capsule (60 mg total) by mouth daily. 12/03/22   Sondra Come, MD  venlafaxine XR (EFFEXOR-XR) 75 MG 24 hr capsule Take 75 mg by mouth daily with breakfast.  03/09/19   [provider]    Physical Exam: Vitals:   01/12/24 1857 01/12/24 1905 01/12/24 2044  BP: (!) 120/107  (!) 156/87  Pulse: (!) 59  77  Resp: 18  18  Temp: 97.6 F (36.4 C)  (!) 97.3 F (36.3 C)  TempSrc: Oral  Oral  SpO2: 97%  95%  Height:  5\' 8"  (1.727 m)    Constitutional: Acutely ill looking, NAD, calm, comfortable Eyes: PERRL, lids and conjunctivae normal ENMT: Mucous membranes are dry, posterior pharynx clear of any exudate or lesions.Normal dentition.  Neck: normal, supple, no masses, no thyromegaly Respiratory: clear to auscultation bilaterally, no wheezing, no crackles. Normal respiratory effort. No accessory muscle use.  Cardiovascular: Sinus bradycardia, no murmurs / rubs / gallops. No extremity edema. 2+ pedal pulses. No carotid bruits.  Abdomen: no tenderness, no masses palpated. No hepatosplenomegaly. Bowel sounds positive.  Musculoskeletal: Good range of motion, no joint swelling or tenderness, Skin: no rashes, lesions, ulcers. No induration Neurologic: CN 2-12 grossly intact. Sensation intact, DTR normal. Strength 5/5 in all 4.  Psychiatric: Normal judgment and insight. Alert and  oriented x 3. Normal mood  Data Reviewed:  Temperature 97.3, blood pressure 120/107, pulse 59, white count 14.1, chloride 113 CO2 19 BUN 47 creatinine 2.63 and calcium 9.8.  Urinalysis showed large leukocytes nitrite positive protein 30 and many bacteria WBC more than 50 CT renal stone shows no acute findings  Assessment and Plan:  #1 AKI with CKD 3: Baseline creatinine was 1.26 but currently 2.63.  Most likely prerenal due to poor oral intake.  Patient will be admitted.  Aggressively hydrated.  She does have an EF of 45 to 50%.  We will still hydrate as patient appears to be intravascularly dry.  Follow renal function closely.  #2 UTI: Urine cultures and blood cultures to be obtained.  Initiate IV Rocephin while waiting for culture results.  #3 essential hypertension: Largely uncontrolled.  Resume home regimen and adjust  #4 dementia: Alzheimer's mixed with vascular.  Continue Namenda and Aricept.  #5 hypothyroidism: Continue to replete  #5 chronic systolic dysfunction: EF was 45 to 50%.  No evidence of decompensation.  Continue to monitor  #6 generalized weakness: Will consult PT and OT    Advance Care Planning:  Code Status: Full Code   Consults: None  Family Communication: No family at bedside  Severity of Illness: The appropriate patient status for this patient is INPATIENT. Inpatient status is judged to be reasonable and necessary in order to provide the required intensity of service to ensure the patient's safety. The patient's presenting symptoms, physical exam findings, and initial radiographic and laboratory data in the context of their chronic comorbidities is felt to place them at high risk for further clinical deterioration. Furthermore, it is not anticipated that the patient will be medically stable for discharge from the hospital within 2 midnights of admission.   * I certify that at the point of admission it is my clinical judgment that the patient will require  inpatient hospital care spanning beyond 2 midnights from the point of admission due to high intensity of service, high risk for further deterioration and high frequency of surveillance required.*  AuthorLonia Blood, MD 01/12/2024 9:48 PM  For on call review [email or web address].

## 2024-01-12 NOTE — ED Triage Notes (Signed)
 Pt had labs drawn yesterday and GFR was 15, Oncologist told pt to come to ER. Family reports odor in pt urine. Hx of dementia

## 2024-01-12 NOTE — Progress Notes (Addendum)
 01/12/2024 4:59 PM   [name] 02-Aug-1931 [id number]  CC: Chief Complaint  Patient presents with   Follow-up   HPI: Kerri [name] is a 89 y.o. female with PMH normal pressure hydrocephalus, dementia, bacteriuria of unclear significance previously on suppressive trimethoprim, and OAB wet on trospium ER 60 mg who presents today for cath UA.  She is accompanied today by her caregiver, Kerri [name], who contributes to HPI.  Today Kerri [name] reports that they recently saw Dr. Sherryll Burger in neurology.  As part of his workup, he has requested a urinalysis.  Since she typically cannot provide a voided specimen, they have come here requesting a catheterized UA.  Kerri [name] reports that [name] has had some recent fatigue, lethargy, and depression.  She has been stating that she does not feel well for the past couple of days.  Notably, she had labs drawn at her PCP yesterday, which were notable for creatinine 2.9 with EGFR 15.  This represents a significant acute to subacute decline, with baseline creatinine 1.3/GFR 39 in August 2024.  In-office catheterized UA today positive for 3+ blood, 2+ protein, and 3+ leukocytes; urine microscopy with >30 WBCs/HPF, >30 RBCs/HPF, and many bacteria.  PMH: Past Medical History:  Diagnosis Date   Arthritis     Surgical History: Past Surgical History:  Procedure Laterality Date   INTRAMEDULLARY (IM) NAIL INTERTROCHANTERIC Left 09/06/2022   Procedure: INTRAMEDULLARY (IM) NAIL INTERTROCHANTERIC;  Surgeon: Signa Kell, MD;  Location: [facility] ORS;  Service: Orthopedics;  Laterality: Left;   PACEMAKER LEADLESS INSERTION N/A 01/07/2021   Procedure: PACEMAKER LEADLESS INSERTION;  Surgeon: Marcina Millard, MD;  Location: [facility] INVASIVE CV LAB;  Service: Cardiovascular;  Laterality: N/A;   PPM GENERATOR REMOVAL N/A 01/07/2021   Procedure: PPM GENERATOR REMOVAL;  Surgeon: Marcina Millard, MD;  Location: [facility] INVASIVE CV LAB;  Service: Cardiovascular;  Laterality: N/A;     Home Medications:  Allergies as of 01/12/2024       Reactions   Alendronate Sodium Other (See Comments)   Bupropion Other (See Comments)   Tremors    Rofecoxib Diarrhea   Alendronate Rash        Medication List        Accurate as of January 12, 2024  4:59 PM. If you have any questions, ask your nurse or doctor.          acetaminophen 325 MG tablet Commonly known as: TYLENOL Take 2 tablets (650 mg total) by mouth every 6 (six) hours as needed for mild pain, moderate pain or fever.   bisacodyl 10 MG suppository Commonly known as: DULCOLAX Place 1 suppository (10 mg total) rectally daily as needed for moderate constipation.   chlorhexidine 4 % external liquid Commonly known as: Hibiclens Apply topically daily as needed.   cyanocobalamin 1000 MCG/ML injection Commonly known as: VITAMIN B12 Inject 1,000 mcg into the muscle every 30 (thirty) days.   docusate sodium 100 MG capsule Commonly known as: COLACE Take 1 capsule (100 mg total) by mouth 2 (two) times daily.   donepezil 10 MG tablet Commonly known as: ARICEPT Take 10 mg by mouth at bedtime.   Fe Fum-Vit C-Vit B12-FA Caps capsule Commonly known as: TRIGELS-F FORTE Take 1 capsule by mouth 2 (two) times daily.   fluticasone 50 MCG/ACT nasal spray Commonly known as: FLONASE 2 sprays in both nostrils   folic acid 1 MG tablet Commonly known as: FOLVITE Take 1 mg by mouth daily.   levothyroxine 50 MCG tablet Commonly known as: SYNTHROID  Take 50 mcg by mouth daily before breakfast.   memantine 10 MG tablet Commonly known as: NAMENDA Take 10 mg by mouth 2 (two) times daily.   metoprolol succinate 25 MG 24 hr tablet Commonly known as: TOPROL-XL Take 25 mg by mouth daily.   multivitamin with minerals Tabs tablet Take 1 tablet by mouth daily.   polyethylene glycol powder 17 GM/SCOOP powder Commonly known as: GLYCOLAX/MIRALAX Take 17 g by mouth daily.   potassium chloride 10 MEQ tablet Commonly known  as: KLOR-CON Take 10 mEq by mouth once a week. Every Monday   QUEtiapine 25 MG tablet Commonly known as: SEROQUEL Take by mouth.   trimethoprim 100 MG tablet Commonly known as: TRIMPEX TAKE ONE TABLET BY MOUTH EVERY DAY   Trospium Chloride 60 MG Cp24 Take 1 capsule (60 mg total) by mouth daily.   venlafaxine XR 75 MG 24 hr capsule Commonly known as: EFFEXOR-XR Take 75 mg by mouth daily with breakfast.        Allergies:  Allergies  Allergen Reactions   Alendronate Sodium Other (See Comments)   Bupropion Other (See Comments)    Tremors      Rofecoxib Diarrhea   Alendronate Rash    Family History: No family history on file.  Social History:   reports that she has never smoked. She has never been exposed to tobacco smoke. She has never used smokeless tobacco. She reports that she does not drink alcohol and does not use drugs.  Physical Exam: BP 132/83   Pulse 61   Constitutional:  Alert and oriented, no acute distress, nontoxic appearing HEENT: [place], AT Cardiovascular: No clubbing, cyanosis, or edema Respiratory: Normal respiratory effort, no increased work of breathing Skin: No rashes, bruises or suspicious lesions Neurologic: Grossly intact, no focal deficits, moving all 4 extremities Psychiatric: Normal mood and affect  Laboratory Data: See Epic  Assessment & Plan:   1. Bacteriuria (Primary) UA appears grossly positive today, common for her.  Unclear clinical significance but question possible obstructing stone, see below.  Will send urine for culture and I have advised them to go to the emergency department for further evaluation and workup, see below. - Urinalysis, Complete - CULTURE, URINE COMPREHENSIVE  2. CKD (chronic kidney disease) stage 4, GFR 15-29 ml/min (HCC) She has had a significant and rather rapid decline in her baseline renal function, now approaching ESRD.  I think this could certainly contribute to her recent symptoms, which have been rather  vague.  Even her age and frailty, I recommended proceeding to the emergency department for further evaluation including fluids, imaging, and further lab testing and they agreed.  Return for Will call with results, Proceed to the ED for further evaluation.  [name], PA-C  [facility] [place] [address] [place], [place] [address] [phone]

## 2024-01-13 ENCOUNTER — Encounter (HOSPITAL_COMMUNITY): Payer: Self-pay | Admitting: Internal Medicine

## 2024-01-13 DIAGNOSIS — E039 Hypothyroidism, unspecified: Secondary | ICD-10-CM

## 2024-01-13 DIAGNOSIS — N179 Acute kidney failure, unspecified: Secondary | ICD-10-CM | POA: Diagnosis not present

## 2024-01-13 DIAGNOSIS — F0390 Unspecified dementia without behavioral disturbance: Secondary | ICD-10-CM

## 2024-01-13 DIAGNOSIS — R531 Weakness: Secondary | ICD-10-CM

## 2024-01-13 DIAGNOSIS — Z87440 Personal history of urinary (tract) infections: Secondary | ICD-10-CM | POA: Diagnosis not present

## 2024-01-13 DIAGNOSIS — N1832 Chronic kidney disease, stage 3b: Secondary | ICD-10-CM

## 2024-01-13 DIAGNOSIS — Z950 Presence of cardiac pacemaker: Secondary | ICD-10-CM

## 2024-01-13 DIAGNOSIS — I5022 Chronic systolic (congestive) heart failure: Secondary | ICD-10-CM

## 2024-01-13 DIAGNOSIS — N3000 Acute cystitis without hematuria: Secondary | ICD-10-CM

## 2024-01-13 DIAGNOSIS — I10 Essential (primary) hypertension: Secondary | ICD-10-CM

## 2024-01-13 LAB — MICROSCOPIC EXAMINATION
RBC, Urine: >30 /HPF — AB (ref 0–2)
WBC, UA: >30 /HPF — AB (ref 0–5)

## 2024-01-13 LAB — URINE CULTURE

## 2024-01-13 LAB — RENAL FUNCTION PANEL
Albumin: 2.9 g/dL — ABNORMAL LOW (ref 3.5–5.0)
Anion gap: 10 (ref 5–15)
BUN: 42 mg/dL — ABNORMAL HIGH (ref 8–23)
CO2: 18 mmol/L — ABNORMAL LOW (ref 22–32)
Calcium: 9.1 mg/dL (ref 8.9–10.3)
Chloride: 114 mmol/L — ABNORMAL HIGH (ref 98–111)
Creatinine, Ser: 2.48 mg/dL — ABNORMAL HIGH (ref 0.44–1.00)
GFR, Estimated: 18 mL/min — ABNORMAL LOW (ref >60)
Glucose, Bld: 116 mg/dL — ABNORMAL HIGH (ref 70–99)
Phosphorus: 3.4 mg/dL (ref 2.5–4.6)
Potassium: 4.2 mmol/L (ref 3.5–5.1)
Sodium: 142 mmol/L (ref 135–145)

## 2024-01-13 LAB — URINALYSIS, COMPLETE
Bilirubin, UA: NEGATIVE
Glucose, UA: NEGATIVE
Ketones, UA: NEGATIVE
Nitrite, UA: NEGATIVE
Specific Gravity, UA: 1.02 (ref 1.005–1.030)
Urobilinogen, Ur: 0.2 mg/dL (ref 0.2–1.0)
pH, UA: 5.5 (ref 5.0–7.5)

## 2024-01-13 LAB — CBC
HCT: 37.1 % (ref 36.0–46.0)
Hemoglobin: 11.7 g/dL — ABNORMAL LOW (ref 12.0–15.0)
MCH: 28.7 pg (ref 26.0–34.0)
MCHC: 31.5 g/dL (ref 30.0–36.0)
MCV: 91.2 fL (ref 80.0–100.0)
Platelets: 328 10*3/uL (ref 150–400)
RBC: 4.07 MIL/uL (ref 3.87–5.11)
RDW: 14.1 % (ref 11.5–15.5)
WBC: 12.3 10*3/uL — ABNORMAL HIGH (ref 4.0–10.5)
nRBC: 0 % (ref 0.0–0.2)

## 2024-01-13 MED ORDER — NEPRO/CARBSTEADY PO LIQD: 237.0000 mL | Freq: Three times a day (TID) | ORAL | Status: DC | PRN

## 2024-01-13 MED ORDER — ENSURE ENLIVE PO LIQD
237.0000 mL | ORAL | Status: DC
  Administered 2024-01-13 – 2024-01-15 (×3): 237 mL via ORAL

## 2024-01-13 MED ORDER — ACETAMINOPHEN 650 MG RE SUPP: 650.0000 mg | Freq: Four times a day (QID) | RECTAL | Status: DC | PRN

## 2024-01-13 MED ORDER — ENOXAPARIN SODIUM 30 MG/0.3ML IJ SOSY
30.0000 mg | PREFILLED_SYRINGE | INTRAMUSCULAR | Status: DC
  Administered 2024-01-13 – 2024-01-17 (×5): 30 mg via SUBCUTANEOUS
  Filled 2024-01-13 (×5): qty 0.3

## 2024-01-13 MED ORDER — CALCIUM CARBONATE ANTACID 1250 MG/5ML PO SUSP: 500.0000 mg | Freq: Four times a day (QID) | ORAL | Status: DC | PRN

## 2024-01-13 MED ORDER — CAMPHOR-MENTHOL 0.5-0.5 % EX LOTN: 1.0000 | TOPICAL_LOTION | Freq: Three times a day (TID) | CUTANEOUS | Status: DC | PRN

## 2024-01-13 MED ORDER — MEMANTINE HCL 10 MG PO TABS
10.0000 mg | ORAL_TABLET | Freq: Two times a day (BID) | ORAL | Status: DC
  Administered 2024-01-13 – 2024-01-17 (×10): 10 mg via ORAL
  Filled 2024-01-13 (×10): qty 1

## 2024-01-13 MED ORDER — SODIUM CHLORIDE 0.9 % IV SOLN
1.0000 g | INTRAVENOUS | Status: DC
  Administered 2024-01-13 – 2024-01-16 (×4): 1 g via INTRAVENOUS
  Filled 2024-01-13 (×4): qty 10

## 2024-01-13 MED ORDER — ACETAMINOPHEN 325 MG PO TABS
650.0000 mg | ORAL_TABLET | Freq: Four times a day (QID) | ORAL | Status: DC | PRN
  Administered 2024-01-13 – 2024-01-17 (×4): 650 mg via ORAL
  Filled 2024-01-13 (×4): qty 2

## 2024-01-13 MED ORDER — ONDANSETRON HCL 4 MG PO TABS: 4.0000 mg | ORAL_TABLET | Freq: Four times a day (QID) | ORAL | Status: DC | PRN

## 2024-01-13 MED ORDER — DOCUSATE SODIUM 283 MG RE ENEM: 1.0000 | ENEMA | RECTAL | Status: DC | PRN

## 2024-01-13 MED ORDER — DONEPEZIL HCL 10 MG PO TABS
10.0000 mg | ORAL_TABLET | Freq: Every day | ORAL | Status: DC
  Administered 2024-01-13 – 2024-01-16 (×5): 10 mg via ORAL
  Filled 2024-01-13 (×5): qty 1

## 2024-01-13 MED ORDER — HYDROXYZINE HCL 25 MG PO TABS: 25.0000 mg | ORAL_TABLET | Freq: Three times a day (TID) | ORAL | Status: DC | PRN

## 2024-01-13 MED ORDER — SODIUM CHLORIDE 0.9 % IV SOLN: INTRAVENOUS | Status: AC

## 2024-01-13 MED ORDER — ONDANSETRON HCL 4 MG/2ML IJ SOLN: 4.0000 mg | Freq: Four times a day (QID) | INTRAMUSCULAR | Status: DC | PRN

## 2024-01-13 MED ORDER — SORBITOL 70 % SOLN: 30.0000 mL | Status: DC | PRN

## 2024-01-13 NOTE — Progress Notes (Signed)
 PROGRESS NOTE    Kerri [name]  YQI:[id number] DOB: March 18, 1931 DOA: 01/12/2024 PCP: Wilford Corner, PA-C  Chief Complaint  Patient presents with   Abnormal Labs    Hospital Course:  Kerri [name] is 89 y.o. female with hypothyroidism, recurrent UTIs, osteoarthritis, prior anterior trochanter fracture, CKD stage IIIb, chronic systolic heart failure, dementia, hypertension, history of cardiac pacemaker placement, history of normal pressure hydrocephalus, overactive bladder.  Patient was previously on suppressive Bactrim. Patient has been receiving workup outpatient with neurology.  Neurology has requested for urinalysis but patient was unable to provide a voided specimen.  She went to urology for catheterized UA.  At urology office she reported feeling weak.  Outpatient labs revealed a creatinine of 2.9 with EGFR of 15 which was a decline from her baseline.  Patient was sent to the ED for evaluation of AKI.  Subjective: Today patient complains of headache.  Otherwise no issues.  Her son and caregiver at bedside.  We had extensive discussion regarding her outpatient care.  They report that her oral intake has been sufficient.  Her caregiver reports that she drinks water throughout the day.  She does have to be prompted to eat but has had no issues eating.  Her caregiver reports her urine is chronically cloudy.  She also reports she has frequent UTIs. Her caregiver reports her only recent problem was an ear infection for which she received Keflex 3 weeks ago.  Objective: Vitals:   01/12/24 2200 01/13/24 0023 01/13/24 0038 01/13/24 0602  BP: 128/76  (!) 155/68 (!) 144/68  Pulse: 60  (!) 59 62  Resp:   20 16  Temp:   98.1 F (36.7 C) 98.1 F (36.7 C)  TempSrc:   Oral   SpO2: (!) 87%  99% 95%  Weight:  77.1 kg    Height:        Intake/Output Summary (Last 24 hours) at 01/13/2024 0826 Last data filed at 01/13/2024 0706 Gross per 24 hour  Intake 979.2 ml  Output 0 ml  Net 979.2  ml   Filed Weights   01/13/24 0023  Weight: 77.1 kg    Examination: General exam: Appears calm and comfortable, NAD  Respiratory system: No work of breathing, symmetric chest wall expansion Cardiovascular system: S1 & S2 heard, RRR.  Gastrointestinal system: Abdomen is nondistended, soft and nontender.  Neuro: Oriented to self and situation, disoriented to time and date. Extremities: Symmetric, expected ROM Skin: No rashes, lesions Psychiatry: Demonstrates appropriate judgement and insight. Mood & affect appropriate for situation.   Assessment & Plan:  Principal Problem:   AKI (acute kidney injury) (HCC) Active Problems:   UTI (urinary tract infection)   History of recurrent UTI (urinary tract infection)   Chronic kidney disease, stage 3b (HCC)   Chronic systolic CHF (congestive heart failure) (HCC)   Dementia without behavioral disturbance (HCC)   Hypothyroidism   Essential hypertension   Generalized weakness   Status post placement of cardiac pacemaker   AKI superimposed on CKD stage IIIb - Baseline creatinine 1.26, 2.63 on arrival - Labs [name] consistent with dehydration the family reports that she is an excellent water drinker throughout the day. - Unclear if AKI may also be provoked somewhat by UTI.   - Continue with maintenance IV fluids.  Monitor volume status closely as she does have EF 45 to 50% - Continue to trend CMP, improving some. - Strict I's and O's - Avoid nephrotoxic meds when able - Renally dose with a  creatinine clearance of 16  UTI - Urine cultures and blood cultures pending. - Patient does have history of chronic UTI.  Has previously been on suppressive therapies.  Unclear if true UTI versus colonization. - She denies any dysuria but due to her advanced dementia she is unable to provide much history.  Family reports that she becomes confused when having a UTI - Continue with IV Rocephin for now.  Follow urine culture  Essential hypertension -  Initiate home meds and titrate as needed  Alzheimer's dementia mixed with vascular dementia - Resume home meds Namenda and Aricept - Frequent reorientation - Delirium precautions - Decreased appetite intake may be secondary to dementia advancement  Hypothyroidism - Continue home dose Synthroid  Chronic systolic heart failure - EF was 45 to 50%, no evidence of decompensation at this time.  Appears dry - Maintenance IV fluids as above - Initiate GDMT and titrate as tolerated.  Hold off on diuretics for now  Generalized weakness - Consult PT/OT, anticipate she will need at least home health at discharge.  Potentially SNF versus rehab.  Hypoalbuminemia - Suspect some degree of malnutrition - Consult RD  Leukocytosis - Treating for UTI as above - WBC is downtrending - Monitor fever curve  Liver lesion - 11 mm hypodensity in right lobe of liver [name] likely related to cyst or hemangioma.  Incidentally seen on CT abdomen pelvis - Outpatient follow-up if needed  Colonic diverticulosis - Incidentally seen on CT - Bowel regimen as needed to promote daily bowel movement  Osteoporosis Chronic compression deformity of L4 - Resume home meds. - PT/OT.  DVT prophylaxis: lovenox   Code Status: Full Code Family Communication:  Discussed directly with patient, her son Kerri [name] at bedside, her caregiver Kerri [name] at bedside, and her daughter-in-law on the phone Disposition: Inpatient, still hospitalized for AKI.  Monitoring renal function.  On IV antibiotics pending culture results  Consultants:    Procedures:    Antimicrobials:  Anti-infectives (From admission, onward)    Start     Dose/Rate Route Frequency Ordered Stop   01/13/24 1000  cefTRIAXone (ROCEPHIN) 1 g in sodium chloride 0.9 % 100 mL IVPB        1 g 200 mL/hr over 30 Minutes Intravenous Every 24 hours 01/13/24 0022     01/12/24 2145  cefTRIAXone (ROCEPHIN) 1 g in sodium chloride 0.9 % 100 mL IVPB        1 g 200 mL/hr over  30 Minutes Intravenous  Once 01/12/24 2132 01/12/24 2232       Data Reviewed: I have personally reviewed following labs and imaging studies CBC: Recent Labs  Lab 01/12/24 1937 01/13/24 0327  WBC 14.1* 12.3*  NEUTROABS 10.3*  --   HGB 14.0 11.7*  HCT 44.1 37.1  MCV 90.7 91.2  PLT 373 328   Basic Metabolic Panel: Recent Labs  Lab 01/12/24 1937 01/13/24 0327  NA 143 142  K 4.5 4.2  CL 113* 114*  CO2 19* 18*  GLUCOSE 119* 116*  BUN 47* 42*  CREATININE 2.63* 2.48*  CALCIUM 9.8 9.1  PHOS  --  3.4   GFR: Estimated Creatinine Clearance: 16.5 mL/min (A) (by C-G formula based on SCr of 2.48 mg/dL (H)). Liver Function Tests: Recent Labs  Lab 01/12/24 1937 01/13/24 0327  AST 16  --   ALT 11  --   ALKPHOS 68  --   BILITOT 0.6  --   PROT 8.6*  --   ALBUMIN 3.6 2.9*  CBG: No results for input(s): "GLUCAP" in the last 168 hours.  Recent Results (from the past 240 hours)  Microscopic Examination     Status: Abnormal   Collection Time: 01/12/24  4:45 PM   Urine  Result Value Ref Range Status   WBC, UA >30 (A) 0 - 5 /hpf Final   RBC, Urine >30 (A) 0 - 2 /hpf Final   Epithelial Cells (non renal) 0-10 0 - 10 /hpf Final   Mucus, UA Present (A) Not Estab. Final   Bacteria, UA Many (A) None seen/Few Final     Radiology Studies: CT Renal Stone Study Result Date: 01/12/2024 CLINICAL DATA:  Abdominal and flank pain. EXAM: CT ABDOMEN AND PELVIS WITHOUT CONTRAST TECHNIQUE: Multidetector CT imaging of the abdomen and pelvis was performed following the standard protocol without IV contrast. RADIATION DOSE REDUCTION: This exam was performed according to the departmental dose-optimization program which includes automated exposure control, adjustment of the mA and/or kV according to patient size and/or use of iterative reconstruction technique. COMPARISON:  CT abdomen and pelvis 05/14/2014 FINDINGS: Lower chest: No acute abnormality. Heart is enlarged. Pacemaker is present.  Hepatobiliary: There is an 11 mm hypodensity in the right lobe of the liver [name] likely related to cyst or hemangioma. Otherwise, the liver, gallbladder and bile ducts are within normal limits. Pancreas: Unremarkable. No pancreatic ductal dilatation or surrounding inflammatory changes. Spleen: Normal in size without focal abnormality. Adrenals/Urinary Tract: There is an 8 mm calculus in the mid left kidney. Otherwise, the kidneys, adrenal glands, and bladder are within normal limits. Stomach/Bowel: Stomach is within normal limits. Appendix appears normal. No evidence of bowel wall thickening, distention, or inflammatory changes. The appendix is not seen. There is diffuse colonic diverticulosis. Vascular/Lymphatic: Aortic atherosclerosis. No enlarged abdominal or pelvic lymph nodes. Reproductive: Status post hysterectomy. No adnexal masses. Other: No abdominal wall hernia or abnormality. No abdominopelvic ascites. Musculoskeletal: The bones are osteopenic. There is chronic appearing compression deformity of L4. Left-sided hip screws and intramedullary nail are present. There are healed bilateral inferior and right superior pubic rami fractures. IMPRESSION: 1. No acute localizing process in the abdomen or pelvis. 2. Nonobstructing left renal calculus. 3. Colonic diverticulosis without evidence for diverticulitis. 4. Cardiomegaly. 5. Aortic atherosclerosis. Aortic Atherosclerosis (ICD10-I70.0). Electronically Signed   By: Darliss Cheney M.D.   On: 01/12/2024 21:13    Scheduled Meds:  donepezil  10 mg Oral QHS   enoxaparin (LOVENOX) injection  30 mg Subcutaneous Q24H   memantine  10 mg Oral BID   Continuous Infusions:  sodium chloride 75 mL/hr at 01/13/24 0706   cefTRIAXone (ROCEPHIN)  IV       LOS: 1 day  MDM: Patient is high risk for one or more organ failure.  They necessitate ongoing hospitalization for continued IV therapies and subsequent lab monitoring. Total time spent interpreting labs and vitals,  coordinating care amongst consultants and care team members, directly assessing and discussing care with the patient and/or family: 55 min    Debarah Crape, DO [facility]  To contact the attending physician between 7A-7P please use Epic Chat. To contact the covering physician during after hours 7P-7A, please review Amion.   01/13/2024, 8:26 AM   *This document has been created with the assistance of dictation software. Please excuse typographical errors. *

## 2024-01-13 NOTE — Plan of Care (Signed)
  Problem: Clinical Measurements: Goal: Diagnostic test results will improve Outcome: Progressing   Problem: Nutrition: Goal: Adequate nutrition will be maintained Outcome: Progressing   Problem: Coping: Goal: Level of anxiety will decrease Outcome: Progressing   Problem: Pain Managment: Goal: General experience of comfort will improve and/or be controlled Outcome: Progressing   Problem: Safety: Goal: Ability to remain free from injury will improve Outcome: Progressing

## 2024-01-13 NOTE — Progress Notes (Signed)
 Initial Nutrition Assessment  DOCUMENTATION CODES:   Not applicable  INTERVENTION:  - Liberalize to a Regular diet to avoid restricting intake given advanced age.  - Ensure Plus High Protein po once daily, provides 350 kcal and 20 grams of protein. - Monitor weight trends.   NUTRITION DIAGNOSIS:   Increased nutrient needs related to acute illness as evidenced by estimated needs.  GOAL:   Patient will meet greater than or equal to 90% of their needs  MONITOR:   PO intake, Supplement acceptance, Weight trends  REASON FOR ASSESSMENT:   Consult Assessment of nutrition requirement/status  ASSESSMENT:   89 y.o. female with PMH significant of recurrent UTIs, CKD stage IIIb, chronic systolic heart failure, dementia, essential HTN who presented with generalized weakness and suspicion for possible kidney stone. Admitted for UTI and AKI on CKD3.  Patient in bedside chair at time of visit, caretaker and son in room.  Caretaker provided most of nutrition history.   UBW reported to be 172# and caretaker reports patient has gained a little weight over the past few months. Per EMR, weight mostly stable.   Patient typically consumes 3 meals a day at home and appetite has been normal. Does not regularly consume ONS.   Current appetite normal and patient did fairly well with breakfast. Patient on a renal diet but discussed with family and MD about liberalizing to Regular to avoid restricting intake given advanced age and all in agreement. Caretaker also feels the patient would enjoy Ensure.    Medications reviewed and include: Namenda  Labs reviewed:  Creatinine 2.48   NUTRITION - FOCUSED PHYSICAL EXAM:  Flowsheet Row Most Recent Value  Orbital Region Mild depletion  Upper Arm Region No depletion  Thoracic and Lumbar Region Unable to assess  Buccal Region No depletion  Temple Region No depletion  Clavicle Bone Region Severe depletion  Clavicle and Acromion Bone Region Moderate  depletion  Scapular Bone Region Unable to assess  Dorsal Hand Mild depletion  Patellar Region No depletion  Anterior Thigh Region No depletion  Posterior Calf Region No depletion  Edema (RD Assessment) None  Hair Reviewed  Eyes Reviewed  Mouth Reviewed  Skin Reviewed  Nails Reviewed       Diet Order:   Diet Order             Diet regular Room service appropriate? Yes; Fluid consistency: Thin  Diet effective now                   EDUCATION NEEDS:  Education needs have been addressed  Skin:  Skin Assessment: Reviewed RN Assessment  Last BM:  4/2  Height:  Ht Readings from Last 1 Encounters:  01/12/24 5\' 8"  (1.727 m)   Weight:  Wt Readings from Last 1 Encounters:  01/13/24 78.1 kg    BMI:  Body mass index is 26.18 kg/m.  Estimated Nutritional Needs:  Kcal:  1500-1650 kcals Protein:  70-85 grams Fluid:  >/= 1.5L    Shelle Iron RD, LDN Contact via Secure Chat.

## 2024-01-13 NOTE — TOC Initial Note (Signed)
 Transition of Care [facility]) - Initial/Assessment Note    Patient Details  Name: Kerri [name] MRN: [id number] Date of Birth: [birth year]  Transition of Care [facility]) CM/SW Contact:    Howell Rucks, RN Phone Number: 01/13/2024, 10:12 AM  Clinical Narrative:   Met with patient, pt's son and home caregiver at bedside to introduce role of TOC/NCM and review for dc planning, caregiver answered most of assessment questions, caregiver reports she is 24/7 caregiver, no current home care services but pt attends OP therapy twice weekly with plan to increase to 3x/wk, home DME: walker, wheelchair, hospital bed, shower chair ramp, gait belt. PT eval pending, await recommendation. TOC will continue to follow.   Expected Discharge Plan: Home/Self Care Barriers to Discharge: Continued Medical Work up   Patient Goals and CMS Choice Patient states their goals for this hospitalization and ongoing recovery are:: return home with caregiver          Expected Discharge Plan and Services       Living arrangements for the past 2 months: Single Family Home                                      Prior Living Arrangements/Services Living arrangements for the past 2 months: Single Family Home Lives with:: Other (Comment) (24 hour caregiver) Patient language and need for interpreter reviewed:: Yes Do you feel safe going back to the place where you live?: Yes      Need for Family Participation in Patient Care: Yes (Comment) Care giver support system in place?: Yes (comment) Current home services: DME (wheelchair, walker, hospital bed, shower chair, ramp, gait belt) Criminal Activity/Legal Involvement Pertinent to Current Situation/Hospitalization: No - Comment as needed  Activities of Daily Living   ADL Screening (condition at time of admission) Independently performs ADLs?: No Does the patient have a NEW difficulty with bathing/dressing/toileting/self-feeding that is expected to last >3 days?:  Yes (Initiates electronic notice to provider for possible OT consult) Does the patient have a NEW difficulty with getting in/out of bed, walking, or climbing stairs that is expected to last >3 days?: Yes (Initiates electronic notice to provider for possible PT consult) Does the patient have a NEW difficulty with communication that is expected to last >3 days?: No Is the patient deaf or have difficulty hearing?: No Does the patient have difficulty seeing, even when wearing glasses/contacts?: No Does the patient have difficulty concentrating, remembering, or making decisions?: No  Permission Sought/Granted                  Emotional Assessment Appearance:: Appears stated age Attitude/Demeanor/Rapport: Gracious Affect (typically observed): Accepting Orientation: : Oriented to Self, Oriented to Place, Oriented to Situation Alcohol / Substance Use: Not Applicable Psych Involvement: No (comment)  Admission diagnosis:  Dehydration [E86.0] Acute UTI [N39.0] AKI (acute kidney injury) (HCC) [N17.9] Patient Active Problem List   Diagnosis Date Noted   AKI (acute kidney injury) (HCC) 01/12/2024   Acute postoperative anemia due to expected blood loss 09/07/2022   Displaced intertrochanteric fracture of left femur, initial encounter for closed fracture (HCC) 09/05/2022   Orthostatic hypotension 07/12/2022   Diarrhea 07/10/2022   COVID-19 virus infection 07/08/2022   Electrolyte abnormality 07/08/2022   Malnutrition of moderate degree 07/08/2022   Acute delirium 07/08/2022   History of recurrent UTI (urinary tract infection) 01/30/2022   Constipation    UTI (urinary tract infection) 10/15/2021  Elevated brain natriuretic peptide (BNP) level 10/15/2021   Generalized weakness 10/15/2021   Chronic kidney disease, stage 3b (HCC) 10/15/2021   Normal pressure hydrocephalus (HCC) 10/15/2021   Dementia without behavioral disturbance (HCC) 10/15/2021   Hypothyroidism 10/15/2021   Essential  hypertension 10/15/2021   Overactive bladder 10/15/2021   Moderate mitral regurgitation 01/13/2021   Status post placement of cardiac pacemaker 01/13/2021   CHB (complete heart block) (HCC) 01/07/2021   Chronic systolic CHF (congestive heart failure) (HCC) 12/17/2020   Postmenopausal osteoporosis 08/26/2016   Mixed Alzheimer's and vascular dementia (HCC) 06/13/2014   PCP:  Wilford Corner, PA-C Pharmacy:   [facility] - [place], [place] - [address] [place] [address] Phone: [phone] Fax: [phone]  [id number] - [place], [facility] - [address] [place] [address] Phone: [phone] Fax: [phone]     Social Drivers of Health (SDOH) Social History: SDOH Screenings   Food Insecurity: No Food Insecurity (01/13/2024)  Housing: Low Risk  (01/13/2024)  Transportation Needs: No Transportation Needs (01/13/2024)  Utilities: Not At Risk (01/13/2024)  Financial Resource Strain: Low Risk  (06/14/2023)   Received from [facility]  Physical Activity: Insufficiently Active (05/01/2019)  Social Connections: Socially Isolated (01/13/2024)  Stress: No Stress Concern Present (05/01/2019)  Tobacco Use: Low Risk  (01/12/2024)   SDOH Interventions:     Readmission Risk Interventions    01/13/2024   10:09 AM 09/10/2022   10:03 AM  Readmission Risk Prevention Plan  Transportation Screening Complete Complete  PCP or Specialist Appt within 5-7 Days Complete   Home Care Screening Complete   Medication Review (RN CM) Complete   Medication Review (RN Care Manager)  Complete  PCP or Specialist appointment within 3-5 days of discharge  Complete  HRI or Home Care Consult  Complete  SW Recovery Care/Counseling Consult  Complete  Palliative Care Screening  Not Applicable  Skilled Nursing Facility  Complete

## 2024-01-14 DIAGNOSIS — N179 Acute kidney failure, unspecified: Secondary | ICD-10-CM | POA: Diagnosis not present

## 2024-01-14 DIAGNOSIS — N3000 Acute cystitis without hematuria: Secondary | ICD-10-CM | POA: Diagnosis not present

## 2024-01-14 DIAGNOSIS — Z87440 Personal history of urinary (tract) infections: Secondary | ICD-10-CM | POA: Diagnosis not present

## 2024-01-14 DIAGNOSIS — E039 Hypothyroidism, unspecified: Secondary | ICD-10-CM | POA: Diagnosis not present

## 2024-01-14 LAB — CBC WITH DIFFERENTIAL/PLATELET
Abs Immature Granulocytes: 0.05 10*3/uL (ref 0.00–0.07)
Basophils Absolute: 0 10*3/uL (ref 0.0–0.1)
Basophils Relative: 0 %
Eosinophils Absolute: 0.2 10*3/uL (ref 0.0–0.5)
Eosinophils Relative: 2 %
HCT: 35.8 % — ABNORMAL LOW (ref 36.0–46.0)
Hemoglobin: 11 g/dL — ABNORMAL LOW (ref 12.0–15.0)
Immature Granulocytes: 1 %
Lymphocytes Relative: 25 %
Lymphs Abs: 2.8 10*3/uL (ref 0.7–4.0)
MCH: 28.1 pg (ref 26.0–34.0)
MCHC: 30.7 g/dL (ref 30.0–36.0)
MCV: 91.6 fL (ref 80.0–100.0)
Monocytes Absolute: 1 10*3/uL (ref 0.1–1.0)
Monocytes Relative: 9 %
Neutro Abs: 7.1 10*3/uL (ref 1.7–7.7)
Neutrophils Relative %: 63 %
Platelets: 322 10*3/uL (ref 150–400)
RBC: 3.91 MIL/uL (ref 3.87–5.11)
RDW: 14.4 % (ref 11.5–15.5)
WBC: 11.1 10*3/uL — ABNORMAL HIGH (ref 4.0–10.5)
nRBC: 0 % (ref 0.0–0.2)

## 2024-01-14 LAB — MAGNESIUM: Magnesium: 2 mg/dL (ref 1.7–2.4)

## 2024-01-14 LAB — COMPREHENSIVE METABOLIC PANEL WITH GFR
ALT: 10 U/L (ref 0–44)
AST: 12 U/L — ABNORMAL LOW (ref 15–41)
Albumin: 2.6 g/dL — ABNORMAL LOW (ref 3.5–5.0)
Alkaline Phosphatase: 65 U/L (ref 38–126)
Anion gap: 7 (ref 5–15)
BUN: 36 mg/dL — ABNORMAL HIGH (ref 8–23)
CO2: 17 mmol/L — ABNORMAL LOW (ref 22–32)
Calcium: 8.9 mg/dL (ref 8.9–10.3)
Chloride: 119 mmol/L — ABNORMAL HIGH (ref 98–111)
Creatinine, Ser: 2.22 mg/dL — ABNORMAL HIGH (ref 0.44–1.00)
GFR, Estimated: 21 mL/min — ABNORMAL LOW (ref >60)
Glucose, Bld: 118 mg/dL — ABNORMAL HIGH (ref 70–99)
Potassium: 4.1 mmol/L (ref 3.5–5.1)
Sodium: 143 mmol/L (ref 135–145)
Total Bilirubin: 0.4 mg/dL (ref 0.0–1.2)
Total Protein: 6.4 g/dL — ABNORMAL LOW (ref 6.5–8.1)

## 2024-01-14 LAB — PHOSPHORUS: Phosphorus: 3.2 mg/dL (ref 2.5–4.6)

## 2024-01-14 MED ORDER — VENLAFAXINE HCL ER 75 MG PO CP24
75.0000 mg | ORAL_CAPSULE | Freq: Every day | ORAL | Status: DC
  Administered 2024-01-14 – 2024-01-17 (×4): 75 mg via ORAL
  Filled 2024-01-14 (×4): qty 1

## 2024-01-14 MED ORDER — LEVOTHYROXINE SODIUM 50 MCG PO TABS
50.0000 ug | ORAL_TABLET | Freq: Every day | ORAL | Status: DC
  Administered 2024-01-15 – 2024-01-17 (×3): 50 ug via ORAL
  Filled 2024-01-14 (×3): qty 1

## 2024-01-14 NOTE — Evaluation (Signed)
 Occupational Therapy Evaluation Patient Details Name: Kerri [name] MRN: [id number] DOB: 09-23-1931 Today's Date: 01/14/2024   History of Present Illness   89 y.o. female who presented with generalized weakness and suspicion for possible kidney stone. Admitted for UTI and AKI on CKD3. 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     Clinical Impressions Patient is currently requiring as high as Total assistance with basic ADLs, as well as  moderate assist with bed mobility and up to Maximum assist with functional transfers to pt's Transport chair with use of RW and 2nd person standing by for safety as pt sits unpredictably.    Current level of function is below patient's typical baseline.    During this evaluation, patient was limited by exacerbated dementia symptoms due to being off her meds for a few days, per CG, generalized weakness, LUE inattention and lack of use, impaired activity tolerance, and impaired balance, all of which has the potential to impact patient's and/or caregivers' safety and independence during functional mobility, as well as performance for ADLs.    Patient lives with her CG, [name] who  is able to provide 24/7 supervision and assistance with help from pt's son and DIL when [name] needs a break.  Patient demonstrates good rehab potential, and should benefit from continued skilled occupational therapy services while in acute care to maximize safety, independence and quality of life at home.  Continued occupational therapy services in pt's home are recommended.  ?      If plan is discharge home, recommend the following:   A lot of help with walking and/or transfers;A lot of help with  bathing/dressing/bathroom;Supervision due to cognitive status     Functional Status Assessment   Patient has had a recent decline in their functional status and demonstrates the ability to make significant improvements in function in a reasonable and predictable amount of time.     Equipment Recommendations   None recommended by OT     Recommendations for Other Services         Precautions/Restrictions   Precautions Precautions: Fall Restrictions Weight Bearing Restrictions Per Provider Order: No Other Position/Activity Restrictions: Bil Foot drop, baseline     Mobility Bed Mobility Overal bed mobility: Needs Assistance Bed Mobility: Supine to Sit     Supine to sit: Used rails, HOB elevated, Mod assist          Transfers                          Balance Overall balance assessment: Needs assistance Sitting-balance support: Bilateral upper extremity supported Sitting balance-Leahy Scale: Fair     Standing balance support: Bilateral upper extremity supported, During functional activity, Reliant on assistive device for balance Standing balance-Leahy Scale: Poor                             ADL either performed or assessed with clinical judgement   ADL Overall ADL's : Needs assistance/impaired Eating/Feeding: Minimal assistance;Cueing for sequencing   Grooming: Sitting;Minimal assistance;Cueing for sequencing   Upper Body Bathing: Minimal assistance;Sitting;Cueing for sequencing   Lower Body Bathing: Maximal assistance;Sitting/lateral leans;Bed level;Cueing for sequencing;Cueing for compensatory techniques   Upper  Body Dressing : Minimal assistance;Sitting;Cueing for sequencing   Lower Body Dressing: Maximal assistance;Bed level;Sit to/from stand Lower Body Dressing Details (indicate cue type and reason): Mesh underwear donned over pt's feet for her in bed. Pt able to weight shift and bring underwear up in front to top of thighs.  Once standing, pt required Total Assist to pull up and 2nd person for safety with standing balance. Toilet Transfer: Maximal assistance;Rolling walker (2 wheels);Cueing for sequencing;Cueing for safety;Stand-pivot;+2 for safety/equipment   Toileting- Clothing Manipulation and Hygiene: Total assistance Toileting - Clothing Manipulation Details (indicate cue type and reason): Incontinent at baseline and uses depends. Now on pure wick     Functional mobility during ADLs: Maximal assistance;Moderate assistance;Rolling walker (2 wheels);Cueing for sequencing;Cueing for safety;+2 for safety/equipment       Vision Baseline Vision/History: 1 Wears glasses Vision Assessment?: No apparent visual deficits     Perception Perception: Impaired Preception Impairment Details: Body Scheme Perception-Other Comments: Tends to ignore LUE   Praxis         Pertinent Vitals/Pain Pain Assessment Pain Assessment: No/denies pain Breathing: normal Negative Vocalization: none Facial Expression: smiling or inexpressive Body Language: relaxed Consolability: no need to console PAINAD Score: 0     Extremity/Trunk Assessment Upper Extremity Assessment Upper Extremity Assessment: Right hand dominant;RUE deficits/detail;LUE deficits/detail RUE Deficits / Details: Gen weakness RUE Sensation: history of peripheral neuropathy LUE Deficits / Details: LT UE inattention and minimal use of LUE when doing ADLs but does use LUE with mobility. Weakner than LT with grossly 3-/5 shoulder and grip. LUE Sensation: history of peripheral neuropathy   Lower Extremity Assessment Lower Extremity Assessment: Defer to PT evaluation       Communication Communication Communication: No apparent difficulties   Cognition Arousal: Alert Behavior During Therapy: WFL for tasks assessed/performed Cognition: History of cognitive impairments             OT - Cognition Comments: Per [name], pt's is baseline impaired but more so  now as pt has been off her "dementia medication" for a few days.                 Following commands: Impaired Following commands impaired: Only follows one step commands consistently     Cueing  General Comments   Cueing Techniques: Verbal cues;Gestural cues;Tactile cues;Visual cues      Exercises     Shoulder Instructions      Home Living Family/patient expects to be discharged to:: Private residence Living Arrangements: Other (Comment) (24/7 caregiver or family sit with pt when CG needs to go do anything) Available Help at Discharge: Family;Personal care attendant;Available 24 hours/day Type of Home: House Home Access: Ramped entrance     Home Layout: One level         Bathroom Toilet: Handicapped height Bathroom Accessibility: No (Small per CG)   Home Equipment: Rolling Walker (2 wheels);Rollator (4 wheels);Transport chair;Shower seat;BSC/3in1;Hand held shower head;Hospital bed;Lift chair   Additional Comments: Lives with: [name]  Lives in: House  Stairs:  One story with a basement; 2 steps to enter from the garage. In step down to the den; and and one step ramp into the kitchen  Has following equipment at home: Wheelchair (Transport style x 2), RW, Rollator, hospital bed with rails, and a lift chair, BCommode, and shower chair. Gait belt, Life Alert, hand held shower head.      Prior Functioning/Environment Prior Level of Function : Needs assist  Cognitive Assist : ADLs (cognitive);Mobility (cognitive) Mobility (Cognitive): Step by  step cues ADLs (Cognitive): Step by step cues Physical Assist : ADLs (physical);Mobility (physical) Mobility (physical): Bed mobility;Transfers;Gait;Stairs ADLs (physical): IADLs;Toileting;Dressing;Bathing Mobility Comments: Intermittently ambulatory, short distances with either 2WW or 4WW and [name] always holding pt's gait belt.  Pt will use feet sometimes to help move Transport chair where she spends most of her time. ADLs Comments:  CG, [name] assists with all ADLs, but pt does participate and feeds herself as well as performs own grooming and most UB dressing. No major falls in past 6 months. Sometimes, [name] must lower pt to chair if pt becomes suddently fatigued.    OT Problem List: Decreased strength;Decreased cognition;Decreased safety awareness;Decreased activity tolerance;Decreased knowledge of use of DME or AE;Impaired UE functional use;Impaired balance (sitting and/or standing)   OT Treatment/Interventions: Self-care/ADL training;Therapeutic activities;Cognitive remediation/compensation;Therapeutic exercise;Patient/family education;Balance training;DME and/or AE instruction      OT Goals(Current goals can be found in the care plan section)   Acute Rehab OT Goals Patient Stated Goal: Per CG, she feels comfortable with pt's current assistance needs and does not want SNF. OT agrees as familiar routine is bneficial for pt. OT Goal Formulation:  (With CG,) Potential to Achieve Goals: Fair ADL Goals Pt Will Perform Grooming: sitting (EOB with setup/supervision and good balance, and using LUE ad lib for tasks) Pt Will Transfer to Toilet: with min assist;bedside commode;ambulating;stand pivot transfer Pt/caregiver will Perform Home Exercise Program: Increased strength;Both right and left upper extremity;With Supervision;With minimal assist (Esp focus on LUE function) Additional ADL Goal #1: Pt will tolerate 20 min of unsupported UB functional tasks such as folding linens, or leisure task with good sitting balance without need of UE support and with up to Min As for tasks   OT Frequency:  Min 2X/week    Co-evaluation              AM-PAC OT "6 Clicks" Daily Activity     Outcome Measure Help from another person eating meals?: A Little Help from another person taking care of personal grooming?: A Little Help from another person toileting, which includes using toliet, bedpan, or urinal?: Total Help from another person  bathing (including washing, rinsing, drying)?: A Lot Help from another person to put on and taking off regular upper body clothing?: A Little Help from another person to put on and taking off regular lower body clothing?: A Lot 6 Click Score: 14   End of Session Equipment Utilized During Treatment: Gait belt;Rolling walker (2 wheels) Nurse Communication: Mobility status;Other (comment) (CG in room, knows to alert nursing for back to bed if CG needs to [facility] for any reason.)  Activity Tolerance: Patient tolerated treatment well Patient left: with family/visitor present;Other (comment) (Up in her Transport chair. CG verbalized to have staff help pt back to bed of [name] needs to leave for any reason due to no chair alarm on pt's WC)  OT Visit Diagnosis: Unsteadiness on feet (R26.81);Muscle weakness (generalized) (M62.81);Other symptoms and signs involving cognitive function;Hemiplegia and hemiparesis Hemiplegia - Right/Left: Left (disuse/inattention)                Time: 1610-9604 OT Time Calculation (min): 32 min Charges:  OT General Charges $OT Visit: 1 Visit OT Evaluation $OT Eval Moderate Complexity: 1 Mod OT Treatments $Therapeutic Activity: 8-22 mins  Victorino Dike, OT Acute Rehab Services Office: [phone] 01/14/2024   Theodoro Clock 01/14/2024, 9:17 AM

## 2024-01-14 NOTE — Evaluation (Signed)
 Physical Therapy Evaluation Patient Details Name: Kerri [name] MRN: [id number] DOB: 1930-11-24 Today's Date: 01/14/2024  History of Present Illness  89 y.o. female who presented with generalized weakness and suspicion for possible kidney stone. Admitted for UTI and AKI on CKD3. 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  Clinical Impression  Pt admitted with above diagnosis.  Pt currently with functional limitations due to the deficits listed below (see PT Problem List). Pt will benefit from acute skilled PT to increase their independence and safety with mobility to allow discharge.  Pt in her transport chair from home with caregiver present on arrival to room.  Caregiver reports pt can be more anxious with new faces and typically does well with increased visits of PT.  Pt from home with either family or caregiver present.  Caregiver reports she normally takes more time to get up and started in the morning and mobility improves through day with prompts for standing and ambulating (if pt agreeable).  Pt has been working with neuro OPPT prior to this admission however caregiver reports likely HHPT better upon d/c to improve movement prior to return to community.         If plan is discharge home, recommend the following: A little help with walking and/or transfers;A little help with bathing/dressing/bathroom;Assistance with cooking/housework;Direct supervision/assist for medications management;Help with stairs or ramp for entrance;Supervision due to cognitive status;Assist for transportation   Can travel by private vehicle        Equipment Recommendations None recommended by PT  Recommendations for Other  Services       Functional Status Assessment Patient has had a recent decline in their functional status and demonstrates the ability to make significant improvements in function in a reasonable and predictable amount of time.     Precautions / Restrictions Precautions Precautions: Fall Precaution/Restrictions Comments: urinary incontinence Restrictions Other Position/Activity Restrictions: pt able to perform some AROM with DF (with feet on the floor) but drags R foot with mobilizing most of the time per caregiver (given cues for lifting foot in OPPT per caregiver)      Mobility  Bed Mobility               General bed mobility comments: pt sitting her transport chair from home on arrival    Transfers Overall transfer level: Needs assistance Equipment used: Rolling walker (2 wheels) Transfers: Sit to/from Stand Sit to Stand: Min assist           General transfer comment: verbal cues for hand placement and anterior weight shift; requires assist to rise and stabilize with changing hand support; performed twice (urinary incontinence on initial stand)    Ambulation/Gait Ambulation/Gait assistance: Min assist Gait Distance (Feet): 8 Feet Assistive device: Rolling walker (2 wheels) Gait Pattern/deviations: Narrow base of support, Trunk flexed, Decreased dorsiflexion - right, Step-to pattern Gait velocity: decr     General Gait Details: verbal cues for "big steps" and "lifting R foot"; tends to remain towards left side of RW and trunk lean to right at times (caregiver reports this occurs at home as well); min assist for stabilizing; distance to tolerance (however also limited distance at baseline)  Careers information officer  Tilt Bed    Modified Rankin (Stroke Patients Only)       Balance           Standing balance support: Bilateral upper extremity supported, During functional activity, Reliant on assistive device for balance Standing  balance-Leahy Scale: Poor                               Pertinent Vitals/Pain Pain Assessment Pain Assessment: PAINAD Breathing: normal Negative Vocalization: none Facial Expression: smiling or inexpressive Body Language: relaxed Consolability: no need to console PAINAD Score: 0 Pain Intervention(s): Repositioned    Home Living Family/patient expects to be discharged to:: Private residence Living Arrangements: Other (Comment) (24/7 caregiver or family sit with pt when CG needs to go do anything) Available Help at Discharge: Family;Personal care attendant;Available 24 hours/day Type of Home: House Home Access: Ramped entrance       Home Layout: One level Home Equipment: Agricultural consultant (2 wheels);Rollator (4 wheels);Transport chair;Shower seat;BSC/3in1;Hand held shower head;Hospital bed;Lift chair Additional Comments: Lives with: [name]  Lives in: House  Stairs:  One story with a basement; 2 steps to enter from the garage. In step down to the den; and and one step ramp into the kitchen  Has following equipment at home: Wheelchair (Transport style x 2), RW, Rollator, hospital bed with rails, and a lift chair, BCommode, and shower chair. Gait belt, Life Alert, hand held shower head.    Prior Function Prior Level of Function : Needs assist  Cognitive Assist : ADLs (cognitive);Mobility (cognitive) Mobility (Cognitive): Step by step cues ADLs (Cognitive): Step by step cues Physical Assist : ADLs (physical);Mobility (physical) Mobility (physical): Bed mobility;Transfers;Gait;Stairs ADLs (physical): IADLs;Toileting;Dressing;Bathing Mobility Comments: Intermittently ambulatory, short distances with either 2WW or 4WW and [name] always holding pt's gait belt.  Pt will use feet sometimes to help move Transport chair where she spends most of her time. ADLs Comments: CG, [name] assists with all ADLs, but pt does participate and feeds herself as well as performs own grooming and most UB  dressing. No major falls in past 6 months. Sometimes, [name] must lower pt to chair if pt becomes suddently fatigued.     Extremity/Trunk Assessment   Upper Extremity Assessment Upper Extremity Assessment: Right hand dominant;RUE deficits/detail;LUE deficits/detail RUE Deficits / Details: Gen weakness RUE Sensation: history of peripheral neuropathy LUE Deficits / Details: LT UE inattention and minimal use of LUE when doing ADLs but does use LUE with mobility. Weakner than LT with grossly 3-/5 shoulder and grip. LUE Sensation: history of peripheral neuropathy    Lower Extremity Assessment Lower Extremity Assessment: Generalized weakness;RLE deficits/detail RLE Deficits / Details: bilaterally pt is able to perform mid range AROM against gravity in sitting position, observed grossly 25% AROM with bil DF with feet on the floor, caregiver reports R LE weakness and foot drag with mobility due to hx of spinal stenosis, neuropathy and significant pressure hydrocephalus abnd ventriculomegaly (which also increased after left femur fx) RLE Sensation: history of peripheral neuropathy       Communication   Communication Communication: No apparent difficulties    Cognition Arousal: Alert Behavior During Therapy: WFL for tasks assessed/performed   PT - Cognitive impairments: History of cognitive impairments                       PT - Cognition Comments: hx of dementia Following commands: Impaired Following commands impaired: Only follows one step commands  consistently     Cueing Cueing Techniques: Verbal cues, Gestural cues, Tactile cues, Visual cues     General Comments      Exercises     Assessment/Plan    PT Assessment Patient needs continued PT services  PT Problem List Decreased strength;Decreased knowledge of use of DME;Decreased activity tolerance;Decreased mobility;Decreased balance       PT Treatment Interventions DME instruction;Gait training;Balance  training;Functional mobility training;Therapeutic activities;Therapeutic exercise;Neuromuscular re-education;Patient/family education;Wheelchair mobility training    PT Goals (Current goals can be found in the Care Plan section)  Acute Rehab PT Goals PT Goal Formulation: With patient/family Time For Goal Achievement: 01/28/24 Potential to Achieve Goals: Good    Frequency Min 3X/week     Co-evaluation               AM-PAC PT "6 Clicks" Mobility  Outcome Measure Help needed turning from your back to your side while in a flat bed without using bedrails?: A Lot Help needed moving from lying on your back to sitting on the side of a flat bed without using bedrails?: A Lot Help needed moving to and from a bed to a chair (including a wheelchair)?: A Little Help needed standing up from a chair using your arms (e.g., wheelchair or bedside chair)?: A Little Help needed to walk in hospital room?: A Lot Help needed climbing 3-5 steps with a railing? : A Lot 6 Click Score: 14    End of Session Equipment Utilized During Treatment: Gait belt Activity Tolerance: Patient tolerated treatment well Patient left: in chair;with call bell/phone within reach;with chair alarm set;with family/visitor present Nurse Communication: Mobility status PT Visit Diagnosis: Other abnormalities of gait and mobility (R26.89)    Time: 3086-5784 PT Time Calculation (min) (ACUTE ONLY): 17 min   Charges:   PT Evaluation $PT Eval Moderate Complexity: 1 Mod   PT General Charges $$ ACUTE PT VISIT: 1 Visit        Thomasene Mohair PT, DPT Physical Therapist Acute Rehabilitation Services Office: [phone]   Kati L Payson 01/14/2024, 12:12 PM

## 2024-01-14 NOTE — Plan of Care (Signed)
  Problem: Pain Managment: Goal: General experience of comfort will improve and/or be controlled Outcome: Progressing   Problem: Respiratory: Goal: Ability to maintain adequate ventilation will improve Outcome: Progressing

## 2024-01-14 NOTE — Progress Notes (Signed)
 PROGRESS NOTE    Kerri [name]  XBM:[id number] DOB: 02/11/1931 DOA: 01/12/2024 PCP: Wilford Corner, PA-C  Chief Complaint  Patient presents with   Abnormal Labs    Hospital Course:  Kerri [name] is 89 y.o. female with hypothyroidism, recurrent UTIs, osteoarthritis, prior anterior trochanter fracture, CKD stage IIIb, chronic systolic heart failure, dementia, hypertension, history of cardiac pacemaker placement, history of normal pressure hydrocephalus, overactive bladder.  Patient was previously on suppressive Bactrim. Patient has been receiving workup outpatient with neurology.  Neurology has requested for urinalysis but patient was unable to provide a voided specimen.  She went to urology for catheterized UA.  At urology office she reported feeling weak.  Outpatient labs revealed a creatinine of 2.9 with EGFR of 15 which was a decline from her baseline.  Patient was sent to the ED for evaluation of AKI.  Subjective: No acute events overnight.  Patient endorses that she has had good p.o. intake and is trying to drink a lot of water.  Continues to deny dysuria  Objective: Vitals:   01/13/24 1349 01/13/24 2139 01/14/24 0627 01/14/24 1329  BP: (!) 140/68 (!) 150/68 (!) 154/63 (!) 151/69  Pulse: 61 67 61 (!) 59  Resp: 18 17 17 16   Temp: 98.2 F (36.8 C) 97.9 F (36.6 C) 98.6 F (37 C) 98 F (36.7 C)  TempSrc: Oral     SpO2: 99% 98% 96% 99%  Weight:      Height:        Intake/Output Summary (Last 24 hours) at 01/14/2024 1414 Last data filed at 01/14/2024 1300 Gross per 24 hour  Intake 2225.95 ml  Output 1500 ml  Net 725.95 ml   Filed Weights   01/13/24 0023 01/13/24 0826  Weight: 77.1 kg 78.1 kg    Examination: General exam: Appears calm and comfortable, NAD  Respiratory system: No work of breathing, symmetric chest wall expansion Cardiovascular system: S1 & S2 heard, RRR.  Gastrointestinal system: Abdomen is nondistended, soft and nontender.  Neuro: Oriented  to self and situation, disoriented to time and date. Extremities: Symmetric, expected ROM, + pedal edema. Skin: No rashes, lesions Psychiatry: Demonstrates appropriate judgement and insight. Mood & affect appropriate for situation.   Assessment & Plan:  Principal Problem:   AKI (acute kidney injury) (HCC) Active Problems:   UTI (urinary tract infection)   History of recurrent UTI (urinary tract infection)   Chronic kidney disease, stage 3b (HCC)   Chronic systolic CHF (congestive heart failure) (HCC)   Dementia without behavioral disturbance (HCC)   Hypothyroidism   Essential hypertension   Generalized weakness   Status post placement of cardiac pacemaker   AKI superimposed on CKD stage IIIb - Baseline creatinine 1.26, 2.63 on arrival - Labs most consistent with dehydration the family reports that she is an excellent water drinker throughout the day. - Unclear if AKI may also be provoked somewhat by UTI. - Patient is now tolerating p.o., will encourage p.o. hydration.  Discontinue further maintenance IV fluids -Continue to trend CMP - Strict I's and O's - Avoid nephrotoxic meds when able - Renally dose with a creatinine clearance of 18  UTI - Outpatient urine culture still pending.  Urine cultures this admission are polymicrobial.  Have ordered for recollection - Patient does have history of chronic UTI.  Has previously been on suppressive therapies.  Unclear if true UTI versus colonization.  Discussed this extensively with family who feels strongly that she has UTI. - She denies any dysuria  but due to her advanced dementia she is unable to provide much history.   -Continue with Rocephin for now.  Follow new urine cultures.  Essential hypertension - Initiate home meds and titrate as needed  Alzheimer's dementia mixed with vascular dementia - Resume home meds Namenda and Aricept - Appears stable. - Continue frequent reorientation - Continue delirium precautions - Decreased  appetite may be secondary to dementia advancement  Hypothyroidism - Continue home dose Synthroid  Chronic systolic heart failure - EF was 45 to 50%, dry on arrival. - Some lower extremity edema today.  Will hold further IV fluids. - Cont GDMT and titrate as tolerated.  Hold off on diuretics for now  Generalized weakness - Continue PT/OT -- HH at DC, has caregiver  Hypoalbuminemia - Suspect some degree of malnutrition - Consult RD  Leukocytosis - Treating for UTI as above - WBC is downtrending - Continue to monitor fever curve  Liver lesion - 11 mm hypodensity in right lobe of liver most likely related to cyst or hemangioma.  Incidentally seen on CT abdomen pelvis - Outpatient follow-up if needed  Colonic diverticulosis - Incidentally seen on CT - Bowel regimen as needed to promote daily bowel movement  Osteoporosis Chronic compression deformity of L4 - Resume home meds. - PT/OT.  DVT prophylaxis: lovenox   Code Status: Full Code Family Communication:  Discussed directly with patient and her caregiver [name] at bedside. Disposition: Inpatient, still hospitalized for AKI.  Monitoring renal function.  On IV antibiotics pending culture results  Consultants:    Procedures:    Antimicrobials:  Anti-infectives (From admission, onward)    Start     Dose/Rate Route Frequency Ordered Stop   01/13/24 1000  cefTRIAXone (ROCEPHIN) 1 g in sodium chloride 0.9 % 100 mL IVPB        1 g 200 mL/hr over 30 Minutes Intravenous Every 24 hours 01/13/24 0022     01/12/24 2145  cefTRIAXone (ROCEPHIN) 1 g in sodium chloride 0.9 % 100 mL IVPB        1 g 200 mL/hr over 30 Minutes Intravenous  Once 01/12/24 2132 01/12/24 2232       Data Reviewed: I have personally reviewed following labs and imaging studies CBC: Recent Labs  Lab 01/12/24 1937 01/13/24 0327 01/14/24 0301  WBC 14.1* 12.3* 11.1*  NEUTROABS 10.3*  --  7.1  HGB 14.0 11.7* 11.0*  HCT 44.1 37.1 35.8*  MCV 90.7 91.2  91.6  PLT 373 328 322   Basic Metabolic Panel: Recent Labs  Lab 01/12/24 1937 01/13/24 0327 01/14/24 0301  NA 143 142 143  K 4.5 4.2 4.1  CL 113* 114* 119*  CO2 19* 18* 17*  GLUCOSE 119* 116* 118*  BUN 47* 42* 36*  CREATININE 2.63* 2.48* 2.22*  CALCIUM 9.8 9.1 8.9  MG  --   --  2.0  PHOS  --  3.4 3.2   GFR: Estimated Creatinine Clearance: 18.5 mL/min (A) (by C-G formula based on SCr of 2.22 mg/dL (H)). Liver Function Tests: Recent Labs  Lab 01/12/24 1937 01/13/24 0327 01/14/24 0301  AST 16  --  12*  ALT 11  --  10  ALKPHOS 68  --  65  BILITOT 0.6  --  0.4  PROT 8.6*  --  6.4*  ALBUMIN 3.6 2.9* 2.6*   CBG: No results for input(s): "GLUCAP" in the last 168 hours.  Recent Results (from the past 240 hours)  Microscopic Examination     Status: Abnormal  Collection Time: 01/12/24  4:45 PM   Urine  Result Value Ref Range Status   WBC, UA >30 (A) 0 - 5 /hpf Final   RBC, Urine >30 (A) 0 - 2 /hpf Final   Epithelial Cells (non renal) 0-10 0 - 10 /hpf Final   Mucus, UA Present (A) Not Estab. Final   Bacteria, UA Many (A) None seen/Few Final  CULTURE, URINE COMPREHENSIVE     Status: None (Preliminary result)   Collection Time: 01/12/24  5:00 PM   Specimen: Urine   UR  Result Value Ref Range Status   Urine Culture, Comprehensive Preliminary report  Preliminary   Organism ID, Bacteria Comment  Preliminary    Comment: Microbiological testing to rule out the presence of possible pathogens is in progress.   Urine Culture     Status: Abnormal   Collection Time: 01/12/24 10:17 PM   Specimen: Urine, Clean Catch  Result Value Ref Range Status   Specimen Description   Final    URINE, CLEAN CATCH Performed at [facility], [address] [place], [place] [address]    Special Requests   Final    NONE Performed at [facility], [address] [place], [place] [address]    Culture MULTIPLE SPECIES PRESENT, SUGGEST RECOLLECTION (A)   Final   Report Status 01/13/2024 FINAL  Final     Radiology Studies: CT Renal Stone Study Result Date: 01/12/2024 CLINICAL DATA:  Abdominal and flank pain. EXAM: CT ABDOMEN AND PELVIS WITHOUT CONTRAST TECHNIQUE: Multidetector CT imaging of the abdomen and pelvis was performed following the standard protocol without IV contrast. RADIATION DOSE REDUCTION: This exam was performed according to the departmental dose-optimization program which includes automated exposure control, adjustment of the mA and/or kV according to patient size and/or use of iterative reconstruction technique. COMPARISON:  CT abdomen and pelvis 05/14/2014 FINDINGS: Lower chest: No acute abnormality. Heart is enlarged. Pacemaker is present. Hepatobiliary: There is an 11 mm hypodensity in the right lobe of the liver most likely related to cyst or hemangioma. Otherwise, the liver, gallbladder and bile ducts are within normal limits. Pancreas: Unremarkable. No pancreatic ductal dilatation or surrounding inflammatory changes. Spleen: Normal in size without focal abnormality. Adrenals/Urinary Tract: There is an 8 mm calculus in the mid left kidney. Otherwise, the kidneys, adrenal glands, and bladder are within normal limits. Stomach/Bowel: Stomach is within normal limits. Appendix appears normal. No evidence of bowel wall thickening, distention, or inflammatory changes. The appendix is not seen. There is diffuse colonic diverticulosis. Vascular/Lymphatic: Aortic atherosclerosis. No enlarged abdominal or pelvic lymph nodes. Reproductive: Status post hysterectomy. No adnexal masses. Other: No abdominal wall hernia or abnormality. No abdominopelvic ascites. Musculoskeletal: The bones are osteopenic. There is chronic appearing compression deformity of L4. Left-sided hip screws and intramedullary nail are present. There are healed bilateral inferior and right superior pubic rami fractures. IMPRESSION: 1. No acute localizing process in the abdomen or  pelvis. 2. Nonobstructing left renal calculus. 3. Colonic diverticulosis without evidence for diverticulitis. 4. Cardiomegaly. 5. Aortic atherosclerosis. Aortic Atherosclerosis (ICD10-I70.0). Electronically Signed   By: Darliss Cheney M.D.   On: 01/12/2024 21:13    Scheduled Meds:  donepezil  10 mg Oral QHS   enoxaparin (LOVENOX) injection  30 mg Subcutaneous Q24H   feeding supplement  237 mL Oral Q24H   [START ON 01/15/2024] levothyroxine  50 mcg Oral QAC breakfast   memantine  10 mg Oral BID   venlafaxine XR  75 mg Oral Daily  Continuous Infusions:  cefTRIAXone (ROCEPHIN)  IV 1 g (01/14/24 0923)     LOS: 2 days  MDM: Patient is high risk for one or more organ failure.  They necessitate ongoing hospitalization for continued IV therapies and subsequent lab monitoring. Total time spent interpreting labs and vitals, coordinating care amongst consultants and care team members, directly assessing and discussing care with the patient and/or family: 55 min    Debarah Crape, DO [facility]  To contact the attending physician between 7A-7P please use Epic Chat. To contact the covering physician during after hours 7P-7A, please review Amion.   01/14/2024, 2:14 PM   *This document has been created with the assistance of dictation software. Please excuse typographical errors. *

## 2024-01-14 NOTE — Plan of Care (Signed)
  Problem: Health Behavior/Discharge Planning: Goal: Ability to manage health-related needs will improve Outcome: Progressing   Problem: Clinical Measurements: Goal: Ability to maintain clinical measurements within normal limits will improve Outcome: Progressing Goal: Will remain free from infection Outcome: Progressing Goal: Diagnostic test results will improve Outcome: Progressing Goal: Cardiovascular complication will be avoided Outcome: Progressing   Problem: Activity: Goal: Risk for activity intolerance will decrease Outcome: Progressing   Problem: Nutrition: Goal: Adequate nutrition will be maintained Outcome: Progressing   Problem: Coping: Goal: Level of anxiety will decrease Outcome: Progressing   Problem: Elimination: Goal: Will not experience complications related to bowel motility Outcome: Progressing Goal: Will not experience complications related to urinary retention Outcome: Progressing   Problem: Pain Managment: Goal: General experience of comfort will improve and/or be controlled Outcome: Progressing   Problem: Safety: Goal: Ability to remain free from injury will improve Outcome: Progressing   Problem: Skin Integrity: Goal: Risk for impaired skin integrity will decrease Outcome: Progressing   Problem: Activity: Goal: Ability to tolerate increased activity will improve Outcome: Progressing   Problem: Clinical Measurements: Goal: Ability to maintain a body temperature in the normal range will improve Outcome: Progressing   Problem: Respiratory: Goal: Ability to maintain adequate ventilation will improve Outcome: Progressing Goal: Ability to maintain a clear airway will improve Outcome: Progressing

## 2024-01-15 DIAGNOSIS — N179 Acute kidney failure, unspecified: Secondary | ICD-10-CM | POA: Diagnosis not present

## 2024-01-15 LAB — COMPREHENSIVE METABOLIC PANEL WITH GFR
ALT: 10 U/L (ref 0–44)
AST: 13 U/L — ABNORMAL LOW (ref 15–41)
Albumin: 2.7 g/dL — ABNORMAL LOW (ref 3.5–5.0)
Alkaline Phosphatase: 61 U/L (ref 38–126)
Anion gap: 10 (ref 5–15)
BUN: 29 mg/dL — ABNORMAL HIGH (ref 8–23)
CO2: 19 mmol/L — ABNORMAL LOW (ref 22–32)
Calcium: 9.1 mg/dL (ref 8.9–10.3)
Chloride: 114 mmol/L — ABNORMAL HIGH (ref 98–111)
Creatinine, Ser: 1.99 mg/dL — ABNORMAL HIGH (ref 0.44–1.00)
GFR, Estimated: 23 mL/min — ABNORMAL LOW (ref >60)
Glucose, Bld: 121 mg/dL — ABNORMAL HIGH (ref 70–99)
Potassium: 4.2 mmol/L (ref 3.5–5.1)
Sodium: 143 mmol/L (ref 135–145)
Total Bilirubin: 0.5 mg/dL (ref 0.0–1.2)
Total Protein: 6.7 g/dL (ref 6.5–8.1)

## 2024-01-15 LAB — PHOSPHORUS: Phosphorus: 2.9 mg/dL (ref 2.5–4.6)

## 2024-01-15 LAB — CBC WITH DIFFERENTIAL/PLATELET
Abs Immature Granulocytes: 0.08 10*3/uL — ABNORMAL HIGH (ref 0.00–0.07)
Basophils Absolute: 0.1 10*3/uL (ref 0.0–0.1)
Basophils Relative: 1 %
Eosinophils Absolute: 0.3 10*3/uL (ref 0.0–0.5)
Eosinophils Relative: 3 %
HCT: 34.8 % — ABNORMAL LOW (ref 36.0–46.0)
Hemoglobin: 11.1 g/dL — ABNORMAL LOW (ref 12.0–15.0)
Immature Granulocytes: 1 %
Lymphocytes Relative: 24 %
Lymphs Abs: 2.6 10*3/uL (ref 0.7–4.0)
MCH: 28.9 pg (ref 26.0–34.0)
MCHC: 31.9 g/dL (ref 30.0–36.0)
MCV: 90.6 fL (ref 80.0–100.0)
Monocytes Absolute: 0.8 10*3/uL (ref 0.1–1.0)
Monocytes Relative: 8 %
Neutro Abs: 7.1 10*3/uL (ref 1.7–7.7)
Neutrophils Relative %: 63 %
Platelets: 315 10*3/uL (ref 150–400)
RBC: 3.84 MIL/uL — ABNORMAL LOW (ref 3.87–5.11)
RDW: 14.4 % (ref 11.5–15.5)
WBC: 11 10*3/uL — ABNORMAL HIGH (ref 4.0–10.5)
nRBC: 0 % (ref 0.0–0.2)

## 2024-01-15 LAB — MAGNESIUM: Magnesium: 1.9 mg/dL (ref 1.7–2.4)

## 2024-01-15 NOTE — Progress Notes (Signed)
 PROGRESS NOTE    Kerri [name]  HYQ:[id number] DOB: [birth year] DOA: 01/12/2024 PCP: Wilford Corner, PA-C   Brief Narrative: 50 with past medical history significant for hypothyroidism, recurrent UTIs, osteoarthritis, prior anterior trochanter fracture, CKD stage IIIb, chronic systolic heart failure, dementia, hypertension, history of cardiac pacemaker, history of normal pressure hydrocephalus, overreactive bladder.  Patient was previously on suppressive Bactrim.  She has been receiving workup outpatient with neurology.  Neurology requested for urinalysis but patient was unable to provide a voided specimen.  She went to urology for catheter catheterization UA.  At urology office reported feeling weak.  Outpatient labs revealed creatinine 2.9.  She was sent to the ED for further evaluation of AKI.   Assessment & Plan:   Principal Problem:   AKI (acute kidney injury) (HCC) Active Problems:   UTI (urinary tract infection)   History of recurrent UTI (urinary tract infection)   Chronic kidney disease, stage 3b (HCC)   Chronic systolic CHF (congestive heart failure) (HCC)   Dementia without behavioral disturbance (HCC)   Hypothyroidism   Essential hypertension   Generalized weakness   Status post placement of cardiac pacemaker   1-AKI superimposed on CKD stage IIIb: - Creatinine baseline 1.2 - Presented with a creatinine of 2.6 slowly trending down today at 1.9 - Continue to encourage oral intake - On admission CT scan did not show any hydronephrosis or kidney obstruction - Received IV fluids  2-UTI: - Presented with pyuria and AKI.  Patient unable to provide history due to dementia - She has been getting IV ceftriaxone since hospitalization. - White blood cell trending down from 14 down to 11 - Initial urine culture multiple bacterial morphotypes - Repeat urine culture yesterday pending   3-Essential HTN:  - On metoprolol at home, on hold due to bradycardia - Monitor  blood pressure  4-Alzheimer's Dementia with vascular dementia: - Continue Namenda and Aricept - Continue Effexor -Follow-up with neurologist for further care of behavioral dementia  5-Hypothyroidism:  - Continue Synthroid  Chronic systolic heart failure - On metoprolol at home, currently on hold due to bradycardia   Generalized weakness -Continue PT OT   Hypoalbuminemia    Liver lesion - 11 mm hypodensity in right lobe of liver most likely related to cyst or hemangioma.  Incidentally seen on CT abdomen pelvis - Outpatient follow-up if needed   Colonic diverticulosis - Incidentally seen on CT - Bowel regimen as needed to promote daily bowel movement   Osteoporosis Chronic compression deformity of L4 - Resume home meds. - PT/OT.       Nutrition Problem: Increased nutrient needs Etiology: acute illness    Signs/Symptoms: estimated needs    Interventions: Refer to RD note for recommendations, Ensure Enlive (each supplement provides 350kcal and 20 grams of protein), Liberalize Diet  Estimated body mass index is 27.35 kg/m as calculated from the following:   Height as of this encounter: 5\' 8"  (1.727 m).   Weight as of this encounter: 81.6 kg.   DVT prophylaxis: Lovenox Code Status: Full code Family Communication: Multiple family over the phone Disposition Plan:  Status is: Inpatient Remains inpatient appropriate because: Management of AKI    Consultants:  None  Procedures:  None  Antimicrobials:    Subjective: She is alert, pleasant. 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 They will follow up with neurologist. Patient has been drinking anough fluids at home.   Objective: Vitals:   01/14/24 0627 01/14/24 1329 01/14/24 2035 01/15/24 0427  BP: (!) 154/63 Marland Kitchen)  151/69 (!) 140/49 118/76  Pulse: 61 (!) 59 69 64  Resp: 17 16 18 17   Temp: 98.6 F (37 C) 98 F (36.7 C) (!) 97.5 F (36.4 C) 98.4 F (36.9 C)  TempSrc:   Oral Oral  SpO2: 96% 99%  95% 94%  Weight:    81.6 kg  Height:        Intake/Output Summary (Last 24 hours) at 01/15/2024 0735 Last data filed at 01/15/2024 0600 Gross per 24 hour  Intake 1000 ml  Output 2000 ml  Net -1000 ml   Filed Weights   01/13/24 0023 01/13/24 0826 01/15/24 0427  Weight: 77.1 kg 78.1 kg 81.6 kg    Examination:  General exam: Appears calm and comfortable  Respiratory system: Clear to auscultation. Respiratory effort normal. Cardiovascular system: S1 & S2 heard, RRR.  Gastrointestinal system: Abdomen is nondistended, soft and nontender. No organomegaly or masses felt. Normal bowel sounds heard. Central nervous system: Alert and oriented. No focal neurological deficits. Extremities: Symmetric 5 x 5 power.   Data Reviewed: I have personally reviewed following labs and imaging studies  CBC: Recent Labs  Lab 01/12/24 1937 01/13/24 0327 01/14/24 0301 01/15/24 0309  WBC 14.1* 12.3* 11.1* 11.0*  NEUTROABS 10.3*  --  7.1 7.1  HGB 14.0 11.7* 11.0* 11.1*  HCT 44.1 37.1 35.8* 34.8*  MCV 90.7 91.2 91.6 90.6  PLT 373 328 322 315   Basic Metabolic Panel: Recent Labs  Lab 01/12/24 1937 01/13/24 0327 01/14/24 0301 01/15/24 0309  NA 143 142 143 143  K 4.5 4.2 4.1 4.2  CL 113* 114* 119* 114*  CO2 19* 18* 17* 19*  GLUCOSE 119* 116* 118* 121*  BUN 47* 42* 36* 29*  CREATININE 2.63* 2.48* 2.22* 1.99*  CALCIUM 9.8 9.1 8.9 9.1  MG  --   --  2.0 1.9  PHOS  --  3.4 3.2 2.9   GFR: Estimated Creatinine Clearance: 21.1 mL/min (A) (by C-G formula based on SCr of 1.99 mg/dL (H)). Liver Function Tests: Recent Labs  Lab 01/12/24 1937 01/13/24 0327 01/14/24 0301 01/15/24 0309  AST 16  --  12* 13*  ALT 11  --  10 10  ALKPHOS 68  --  65 61  BILITOT 0.6  --  0.4 0.5  PROT 8.6*  --  6.4* 6.7  ALBUMIN 3.6 2.9* 2.6* 2.7*   No results for input(s): "LIPASE", "AMYLASE" in the last 168 hours. No results for input(s): "AMMONIA" in the last 168 hours. Coagulation Profile: No results for  input(s): "INR", "PROTIME" in the last 168 hours. Cardiac Enzymes: No results for input(s): "CKTOTAL", "CKMB", "CKMBINDEX", "TROPONINI" in the last 168 hours. BNP (last 3 results) No results for input(s): "PROBNP" in the last 8760 hours. HbA1C: No results for input(s): "HGBA1C" in the last 72 hours. CBG: No results for input(s): "GLUCAP" in the last 168 hours. Lipid Profile: No results for input(s): "CHOL", "HDL", "LDLCALC", "TRIG", "CHOLHDL", "LDLDIRECT" in the last 72 hours. Thyroid Function Tests: No results for input(s): "TSH", "T4TOTAL", "FREET4", "T3FREE", "THYROIDAB" in the last 72 hours. Anemia Panel: No results for input(s): "VITAMINB12", "FOLATE", "FERRITIN", "TIBC", "IRON", "RETICCTPCT" in the last 72 hours. Sepsis Labs: No results for input(s): "PROCALCITON", "LATICACIDVEN" in the last 168 hours.  Recent Results (from the past 240 hours)  Microscopic Examination     Status: Abnormal   Collection Time: 01/12/24  4:45 PM   Urine  Result Value Ref Range Status   WBC, UA >30 (A) 0 - 5 /hpf  Final   RBC, Urine >30 (A) 0 - 2 /hpf Final   Epithelial Cells (non renal) 0-10 0 - 10 /hpf Final   Mucus, UA Present (A) Not Estab. Final   Bacteria, UA Many (A) None seen/Few Final  CULTURE, URINE COMPREHENSIVE     Status: None (Preliminary result)   Collection Time: 01/12/24  5:00 PM   Specimen: Urine   UR  Result Value Ref Range Status   Urine Culture, Comprehensive Preliminary report  Preliminary   Organism ID, Bacteria Comment  Preliminary    Comment: Microbiological testing to rule out the presence of possible pathogens is in progress.   Urine Culture     Status: Abnormal   Collection Time: 01/12/24 10:17 PM   Specimen: Urine, Clean Catch  Result Value Ref Range Status   Specimen Description   Final    URINE, CLEAN CATCH Performed at [facility], [address], [place], [place] [address]    Special Requests   Final    NONE Performed at [facility], [address], [place], [place] [address]    Culture MULTIPLE SPECIES PRESENT, SUGGEST RECOLLECTION (A)  Final   Report Status 01/13/2024 FINAL  Final         Radiology Studies: No results found.      Scheduled Meds:  donepezil  10 mg Oral QHS   enoxaparin (LOVENOX) injection  30 mg Subcutaneous Q24H   feeding supplement  237 mL Oral Q24H   levothyroxine  50 mcg Oral QAC breakfast   memantine  10 mg Oral BID   venlafaxine XR  75 mg Oral Daily   Continuous Infusions:  cefTRIAXone (ROCEPHIN)  IV 1 g (01/14/24 0923)     LOS: 3 days    Time spent: 35 minutes    Randy Castrejon A Felice Deem, MD [facility]   If 7PM-7AM, please contact night-coverage [email or web address]  01/15/2024, 7:35 AM

## 2024-01-16 ENCOUNTER — Ambulatory Visit: Payer: Medicare Other | Admitting: Physical Therapy

## 2024-01-16 ENCOUNTER — Ambulatory Visit: Payer: Medicare Other | Admitting: Occupational Therapy

## 2024-01-16 DIAGNOSIS — N179 Acute kidney failure, unspecified: Secondary | ICD-10-CM | POA: Diagnosis not present

## 2024-01-16 LAB — CBC WITH DIFFERENTIAL/PLATELET
Abs Immature Granulocytes: 0.08 10*3/uL — ABNORMAL HIGH (ref 0.00–0.07)
Basophils Absolute: 0.1 10*3/uL (ref 0.0–0.1)
Basophils Relative: 1 %
Eosinophils Absolute: 0.3 10*3/uL (ref 0.0–0.5)
Eosinophils Relative: 3 %
HCT: 38.3 % (ref 36.0–46.0)
Hemoglobin: 11.9 g/dL — ABNORMAL LOW (ref 12.0–15.0)
Immature Granulocytes: 1 %
Lymphocytes Relative: 25 %
Lymphs Abs: 2.9 10*3/uL (ref 0.7–4.0)
MCH: 29.1 pg (ref 26.0–34.0)
MCHC: 31.1 g/dL (ref 30.0–36.0)
MCV: 93.6 fL (ref 80.0–100.0)
Monocytes Absolute: 0.9 10*3/uL (ref 0.1–1.0)
Monocytes Relative: 8 %
Neutro Abs: 7.3 10*3/uL (ref 1.7–7.7)
Neutrophils Relative %: 62 %
Platelets: 325 10*3/uL (ref 150–400)
RBC: 4.09 MIL/uL (ref 3.87–5.11)
RDW: 14.2 % (ref 11.5–15.5)
WBC: 11.6 10*3/uL — ABNORMAL HIGH (ref 4.0–10.5)
nRBC: 0 % (ref 0.0–0.2)

## 2024-01-16 LAB — COMPREHENSIVE METABOLIC PANEL WITH GFR
ALT: 10 U/L (ref 0–44)
AST: 15 U/L (ref 15–41)
Albumin: 2.8 g/dL — ABNORMAL LOW (ref 3.5–5.0)
Alkaline Phosphatase: 58 U/L (ref 38–126)
Anion gap: 12 (ref 5–15)
BUN: 33 mg/dL — ABNORMAL HIGH (ref 8–23)
CO2: 18 mmol/L — ABNORMAL LOW (ref 22–32)
Calcium: 9.1 mg/dL (ref 8.9–10.3)
Chloride: 111 mmol/L (ref 98–111)
Creatinine, Ser: 1.81 mg/dL — ABNORMAL HIGH (ref 0.44–1.00)
GFR, Estimated: 26 mL/min — ABNORMAL LOW (ref >60)
Glucose, Bld: 110 mg/dL — ABNORMAL HIGH (ref 70–99)
Potassium: 4.1 mmol/L (ref 3.5–5.1)
Sodium: 141 mmol/L (ref 135–145)
Total Bilirubin: 0.4 mg/dL (ref 0.0–1.2)
Total Protein: 6.8 g/dL (ref 6.5–8.1)

## 2024-01-16 LAB — PHOSPHORUS: Phosphorus: 3.5 mg/dL (ref 2.5–4.6)

## 2024-01-16 LAB — MAGNESIUM: Magnesium: 1.9 mg/dL (ref 1.7–2.4)

## 2024-01-16 MED ORDER — LEVOFLOXACIN 500 MG PO TABS
250.0000 mg | ORAL_TABLET | Freq: Every day | ORAL | Status: DC
  Administered 2024-01-16 – 2024-01-17 (×2): 250 mg via ORAL
  Filled 2024-01-16 (×2): qty 1

## 2024-01-16 MED ORDER — LEVOFLOXACIN IN D5W 500 MG/100ML IV SOLN
500.0000 mg | INTRAVENOUS | Status: DC
  Filled 2024-01-16: qty 100

## 2024-01-16 MED ORDER — SODIUM CHLORIDE 0.9 % IV SOLN: INTRAVENOUS | Status: DC

## 2024-01-16 NOTE — Plan of Care (Signed)
  Problem: Clinical Measurements: Goal: Ability to maintain clinical measurements within normal limits will improve Outcome: Progressing Goal: Will remain free from infection Outcome: Progressing Goal: Diagnostic test results will improve Outcome: Progressing Goal: Cardiovascular complication will be avoided Outcome: Progressing   Problem: Activity: Goal: Risk for activity intolerance will decrease Outcome: Progressing   Problem: Nutrition: Goal: Adequate nutrition will be maintained Outcome: Progressing   Problem: Coping: Goal: Level of anxiety will decrease Outcome: Progressing   Problem: Pain Managment: Goal: General experience of comfort will improve and/or be controlled Outcome: Progressing   Problem: Safety: Goal: Ability to remain free from injury will improve Outcome: Progressing   Problem: Skin Integrity: Goal: Risk for impaired skin integrity will decrease Outcome: Progressing

## 2024-01-16 NOTE — Plan of Care (Signed)
   Problem: Activity: Goal: Risk for activity intolerance will decrease Outcome: Progressing   Problem: Pain Managment: Goal: General experience of comfort will improve and/or be controlled Outcome: Progressing   Problem: Safety: Goal: Ability to remain free from injury will improve Outcome: Progressing

## 2024-01-16 NOTE — Plan of Care (Signed)
  Problem: Clinical Measurements: Goal: Ability to maintain clinical measurements within normal limits will improve Outcome: Progressing Goal: Will remain free from infection Outcome: Progressing Goal: Diagnostic test results will improve Outcome: Progressing Goal: Cardiovascular complication will be avoided Outcome: Progressing   Problem: Nutrition: Goal: Adequate nutrition will be maintained Outcome: Progressing   Problem: Coping: Goal: Level of anxiety will decrease Outcome: Progressing   Problem: Pain Managment: Goal: General experience of comfort will improve and/or be controlled Outcome: Progressing   Problem: Safety: Goal: Ability to remain free from injury will improve Outcome: Progressing   Problem: Skin Integrity: Goal: Risk for impaired skin integrity will decrease Outcome: Progressing

## 2024-01-16 NOTE — Progress Notes (Signed)
 PROGRESS NOTE    Kerri [name]  UJW:[id number] DOB: [birth year] DOA: 01/12/2024 PCP: Wilford Corner, PA-C   Brief Narrative: 31 with past medical history significant for hypothyroidism, recurrent UTIs, osteoarthritis, prior anterior trochanter fracture, CKD stage IIIb, chronic systolic heart failure, dementia, hypertension, history of cardiac pacemaker, history of normal pressure hydrocephalus, overreactive bladder.  Patient was previously on suppressive Bactrim.  She has been receiving workup outpatient with neurology.  Neurology requested for urinalysis but patient was unable to provide a voided specimen.  She went to urology for catheter catheterization UA.  At urology office reported feeling weak.  Outpatient labs revealed creatinine 2.9.  She was sent to the ED for further evaluation of AKI.   Assessment & Plan:   Principal Problem:   AKI (acute kidney injury) (HCC) Active Problems:   UTI (urinary tract infection)   History of recurrent UTI (urinary tract infection)   Chronic kidney disease, stage 3b (HCC)   Chronic systolic CHF (congestive heart failure) (HCC)   Dementia without behavioral disturbance (HCC)   Hypothyroidism   Essential hypertension   Generalized weakness   Status post placement of cardiac pacemaker   1-AKI superimposed on CKD stage IIIb: - Creatinine baseline 1.2 - Presented with a creatinine of 2.6 slowly trending down today at 1.9 - Continue to encourage oral intake - On admission CT scan did not show any hydronephrosis or kidney obstruction - Received IV fluids  2-UTI: - Presented with pyuria and AKI.  Patient unable to provide history due to dementia - She has been getting IV ceftriaxone since hospitalization. - White blood cell trending down from 14 down to 11 - Initial urine culture multiple bacterial morphotypes - Repeat urine culture 4/05: grew Pseudomonas sensitivity will be available 4/09, Citrobacter sensitivity 4/08 aerococcus.  Plan  to start Oral Levaquin, IV fluids overnight and await for sensitivity for citrobacter for tomorrow.    3-Essential HTN:  - On metoprolol at home, on hold due to bradycardia - Monitor blood pressure  4-Alzheimer's Dementia with vascular dementia: - Continue Namenda and Aricept - Continue Effexor -Follow-up with neurologist for further care of behavioral dementia  5-Hypothyroidism:  - Continue Synthroid  Chronic systolic heart failure - On metoprolol at home, currently on hold due to bradycardia   Generalized weakness -Continue PT OT   Hypoalbuminemia    Liver lesion - 11 mm hypodensity in right lobe of liver most likely related to cyst or hemangioma.  Incidentally seen on CT abdomen pelvis - Outpatient follow-up if needed   Colonic diverticulosis - Incidentally seen on CT - Bowel regimen as needed to promote daily bowel movement   Osteoporosis Chronic compression deformity of L4 - Resume home meds. - PT/OT.       Nutrition Problem: Increased nutrient needs Etiology: acute illness    Signs/Symptoms: estimated needs    Interventions: Refer to RD note for recommendations, Ensure Enlive (each supplement provides 350kcal and 20 grams of protein), Liberalize Diet  Estimated body mass index is 25.06 kg/m as calculated from the following:   Height as of this encounter: 5\' 8"  (1.727 m).   Weight as of this encounter: 74.8 kg.   DVT prophylaxis: Lovenox Code Status: Full code Family Communication: Multiple family over the phone Disposition Plan:  Status is: Inpatient Remains inpatient appropriate because: Management of AKI    Consultants:  None  Procedures:  None  Antimicrobials:    Subjective: No new complaints., some improvement of confusion per family.   Objective: Vitals:  01/16/24 0500 01/16/24 0528 01/16/24 0529 01/16/24 1331  BP:  (!) 146/66 (!) 146/66 134/72  Pulse:  60 61 60  Resp:  17 17 18   Temp:  98.3 F (36.8 C) 98.3 F (36.8  C) 97.8 F (36.6 C)  TempSrc:  Oral Oral   SpO2:  95% 95% 96%  Weight: 74.8 kg     Height:        Intake/Output Summary (Last 24 hours) at 01/16/2024 1447 Last data filed at 01/16/2024 0529 Gross per 24 hour  Intake 340.17 ml  Output 550 ml  Net -209.83 ml   Filed Weights   01/13/24 0826 01/15/24 0427 01/16/24 0500  Weight: 78.1 kg 81.6 kg 74.8 kg    Examination:  General exam: NAD Respiratory system: CTA Cardiovascular system: S 1, S 2 RRR Gastrointestinal system: BS present, soft, nt Central nervous system: Alert Extremities: Symmetric 5 x 5 power.   Data Reviewed: I have personally reviewed following labs and imaging studies  CBC: Recent Labs  Lab 01/12/24 1937 01/13/24 0327 01/14/24 0301 01/15/24 0309 01/16/24 0313  WBC 14.1* 12.3* 11.1* 11.0* 11.6*  NEUTROABS 10.3*  --  7.1 7.1 7.3  HGB 14.0 11.7* 11.0* 11.1* 11.9*  HCT 44.1 37.1 35.8* 34.8* 38.3  MCV 90.7 91.2 91.6 90.6 93.6  PLT 373 328 322 315 325   Basic Metabolic Panel: Recent Labs  Lab 01/12/24 1937 01/13/24 0327 01/14/24 0301 01/15/24 0309 01/16/24 0313  NA 143 142 143 143 141  K 4.5 4.2 4.1 4.2 4.1  CL 113* 114* 119* 114* 111  CO2 19* 18* 17* 19* 18*  GLUCOSE 119* 116* 118* 121* 110*  BUN 47* 42* 36* 29* 33*  CREATININE 2.63* 2.48* 2.22* 1.99* 1.81*  CALCIUM 9.8 9.1 8.9 9.1 9.1  MG  --   --  2.0 1.9 1.9  PHOS  --  3.4 3.2 2.9 3.5   GFR: Estimated Creatinine Clearance: 20.8 mL/min (A) (by C-G formula based on SCr of 1.81 mg/dL (H)). Liver Function Tests: Recent Labs  Lab 01/12/24 1937 01/13/24 0327 01/14/24 0301 01/15/24 0309 01/16/24 0313  AST 16  --  12* 13* 15  ALT 11  --  10 10 10   ALKPHOS 68  --  65 61 58  BILITOT 0.6  --  0.4 0.5 0.4  PROT 8.6*  --  6.4* 6.7 6.8  ALBUMIN 3.6 2.9* 2.6* 2.7* 2.8*   No results for input(s): "LIPASE", "AMYLASE" in the last 168 hours. No results for input(s): "AMMONIA" in the last 168 hours. Coagulation Profile: No results for input(s):  "INR", "PROTIME" in the last 168 hours. Cardiac Enzymes: No results for input(s): "CKTOTAL", "CKMB", "CKMBINDEX", "TROPONINI" in the last 168 hours. BNP (last 3 results) No results for input(s): "PROBNP" in the last 8760 hours. HbA1C: No results for input(s): "HGBA1C" in the last 72 hours. CBG: No results for input(s): "GLUCAP" in the last 168 hours. Lipid Profile: No results for input(s): "CHOL", "HDL", "LDLCALC", "TRIG", "CHOLHDL", "LDLDIRECT" in the last 72 hours. Thyroid Function Tests: No results for input(s): "TSH", "T4TOTAL", "FREET4", "T3FREE", "THYROIDAB" in the last 72 hours. Anemia Panel: No results for input(s): "VITAMINB12", "FOLATE", "FERRITIN", "TIBC", "IRON", "RETICCTPCT" in the last 72 hours. Sepsis Labs: No results for input(s): "PROCALCITON", "LATICACIDVEN" in the last 168 hours.  Recent Results (from the past 240 hours)  Microscopic Examination     Status: Abnormal   Collection Time: 01/12/24  4:45 PM   Urine  Result Value Ref Range Status  WBC, UA >30 (A) 0 - 5 /hpf Final   RBC, Urine >30 (A) 0 - 2 /hpf Final   Epithelial Cells (non renal) 0-10 0 - 10 /hpf Final   Mucus, UA Present (A) Not Estab. Final   Bacteria, UA Many (A) None seen/Few Final  CULTURE, URINE COMPREHENSIVE     Status: Abnormal (Preliminary result)   Collection Time: 01/12/24  5:00 PM   Specimen: Urine   UR  Result Value Ref Range Status   Urine Culture, Comprehensive Preliminary report (A)  Preliminary   Organism ID, Bacteria Comment  Preliminary    Comment: Microbiological testing to rule out the presence of possible pathogens is in progress. Greater than 100,000 colony forming units per mL    Organism ID, Bacteria Gram negative rods (A)  Preliminary    Comment: Greater than 100,000 colony forming units per mL   Organism ID, Bacteria Gram negative rods (A)  Preliminary    Comment: Greater than 100,000 colony forming units per mL  Urine Culture     Status: Abnormal   Collection  Time: 01/12/24 10:17 PM   Specimen: Urine, Clean Catch  Result Value Ref Range Status   Specimen Description   Final    URINE, CLEAN CATCH Performed at [facility], [address] [place], [place] [address]    Special Requests   Final    NONE Performed at [facility], [address] [place], [place] [address]    Culture MULTIPLE SPECIES PRESENT, SUGGEST RECOLLECTION (A)  Final   Report Status 01/13/2024 FINAL  Final  Urine Culture (for pregnant, neutropenic or urologic patients or patients with an indwelling urinary catheter)     Status: Abnormal (Preliminary result)   Collection Time: 01/14/24 12:21 PM   Specimen: In/Out Cath Urine  Result Value Ref Range Status   Specimen Description   Final    IN/OUT CATH URINE Performed at [facility], [address], [place], [place] [address]    Special Requests   Final    NONE Performed at [facility], [address] [place], [place] [address]    Culture (A)  Final    80,000 COLONIES/mL PSEUDOMONAS AERUGINOSA 70,000 COLONIES/mL CITROBACTER FREUNDII SUSCEPTIBILITIES TO FOLLOW >=100,000 COLONIES/mL AEROCOCCUS SPECIES Standardized susceptibility testing for this organism is not available. Performed at [facility], [address] [place], [place] [address]    Report Status PENDING  Incomplete         Radiology Studies: No results found.      Scheduled Meds:  donepezil  10 mg Oral QHS   enoxaparin (LOVENOX) injection  30 mg Subcutaneous Q24H   feeding supplement  237 mL Oral Q24H   levothyroxine  50 mcg Oral QAC breakfast   memantine  10 mg Oral BID   venlafaxine XR  75 mg Oral Daily   Continuous Infusions:  sodium chloride     levofloxacin (LEVAQUIN) IV       LOS: 4 days    Time spent: 35 minutes    Ancil Dewan A Romy Mcgue, MD [facility]   If 7PM-7AM, please contact night-coverage [email or web address]  01/16/2024, 2:47 PM

## 2024-01-16 NOTE — Progress Notes (Signed)
 Occupational Therapy Treatment Patient Details Name: Kerri [name] MRN: [id number] DOB: 28-May-1931 Today's Date: 01/16/2024   History of present illness 89 y.o. female who presented with generalized weakness and suspicion for possible kidney stone. Admitted for UTI and AKI on CKD3. 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   OT comments  Patient seen for skilled OT session this afternoon. Patient and caregiver Richardo Hanks participated in session and open to all treatment below. Patient needed to change brief and worked with OT on standing for dressing and hygiene as well as activity tolerance, balance and light visual/cognitive task seated and standing with support. OT continues to be warranted in Acute hospital setting and for [facility] with caregiver assistance and S upon discharge to home.       If plan is discharge home, recommend the following:  A lot of help with walking and/or transfers;A lot of help with bathing/dressing/bathroom;Supervision due to cognitive status   Equipment Recommendations  None recommended by OT       Precautions / Restrictions Precautions Precautions: Fall Restrictions Weight Bearing Restrictions Per Provider Order: No       Mobility Bed Mobility Overal bed mobility:  (up in recliner for session)                  Transfers Overall transfer level: Needs assistance Equipment used: Rolling walker (2 wheels), None Transfers: Sit to/from Stand Sit to Stand: Mod assist (mod cues for hand placement and forward weight shifts for successful sit to stand x 6 during treatment and LB dressing and peri hygiene)                 Balance Overall balance assessment: Needs  assistance Sitting-balance support: Feet supported, Single extremity supported Sitting balance-Leahy Scale: Fair     Standing balance support: Bilateral upper extremity supported, During functional activity, Reliant on assistive device for balance Standing balance-Leahy Scale: Poor                             ADL either performed or assessed with clinical judgement   ADL Overall ADL's : Needs assistance/impaired Eating/Feeding: Set up;Sitting   Grooming: Wash/dry hands;Wash/dry face;Contact guard assist;Sitting       Lower Body Bathing: Moderate assistance;Sitting/lateral leans;Sit to/from stand       Lower Body Dressing: Maximal assistance;Sitting/lateral leans;Sit to/from stand (changed mesh panties seated and standing level)       Toileting- Clothing Manipulation and Hygiene: Maximal assistance;Sitting/lateral lean;Sit to/from stand         General ADL Comments: cues for sequencing    Extremity/Trunk Assessment Upper Extremity Assessment RUE Deficits / Details: Gen weakness   Lower Extremity Assessment Lower Extremity Assessment: Generalized weakness                 Communication Communication Communication: No apparent difficulties   Cognition Arousal: Alert Behavior During Therapy: WFL for tasks assessed/performed Cognition: History of cognitive impairments             OT - Cognition Comments: incorperated cognitive component into activity tolerance and balance activity                 Following commands: Impaired Following commands impaired: Only  follows one step commands consistently      Cueing   Cueing Techniques: Verbal cues, Gestural cues, Tactile cues, Visual cues  Exercises      Shoulder Instructions       General Comments stood for pressure releifs, simple sit to stand, standing tolerance and balance training during ther act and ADL's    Pertinent Vitals/ Pain       Pain Assessment Pain Assessment: No/denies  pain   Frequency  Min 2X/week        Progress Toward Goals  OT Goals(current goals can now be found in the care plan section)  Progress towards OT goals: Progressing toward goals  Acute Rehab OT Goals Patient Stated Goal: o kep getting stronger to go home tomorrow OT Goal Formulation: With patient/family Potential to Achieve Goals: Fair ADL Goals Pt Will Perform Grooming: sitting Pt Will Transfer to Toilet: with min assist;bedside commode;ambulating;stand pivot transfer Pt/caregiver will Perform Home Exercise Program: Increased strength;Both right and left upper extremity;With Supervision;With minimal assist Additional ADL Goal #1: Pt will tolerate 20 min of unsupported UB functional tasks such as folding linens, or leisure task with good sitting balance without need of UE support and with up to Min As for tasks   AM-PAC OT "6 Clicks" Daily Activity     Outcome Measure   Help from another person eating meals?: A Little Help from another person taking care of personal grooming?: A Little Help from another person toileting, which includes using toliet, bedpan, or urinal?: Total Help from another person bathing (including washing, rinsing, drying)?: A Lot Help from another person to put on and taking off regular upper body clothing?: A Little Help from another person to put on and taking off regular lower body clothing?: A Lot 6 Click Score: 14    End of Session Equipment Utilized During Treatment: Gait belt;Rolling walker (2 wheels)  OT Visit Diagnosis: Unsteadiness on feet (R26.81);Muscle weakness (generalized) (M62.81);Other symptoms and signs involving cognitive function;Hemiplegia and hemiparesis Hemiplegia - Right/Left: Left   Activity Tolerance Patient tolerated treatment well (seated rests between standing intervals of 30 sec, seated balloon toss with B ly UE use and scanning with 25 reps x 4 sets with mod cues to generate simple catagory game items)   Patient Left  with family/visitor present;in chair;with chair alarm set   Nurse Communication Mobility status        Time: 1458-1530 OT Time Calculation (min): 32 min  Charges: OT General Charges $OT Visit: 1 Visit OT Treatments $Therapeutic Activity: 23-37 mins  Tylik Treese OT/L Acute Rehabilitation Department  [phone]  01/16/2024, 3:45 PM

## 2024-01-16 NOTE — Progress Notes (Addendum)
 Pharmacy Antibiotic Note  Kerri [name] is a 89 y.o. female admitted on 01/12/2024 with weakness and AKI found during outpatient urology visit. Patient has been on ceftriaxone for UTI. Pharmacy has been consulted for levofloxacin dosing.  Plan: -Levofloxacin 250 mg PO daily for UTI  Height: 5\' 8"  (172.7 cm) Weight: 74.8 kg (164 lb 12.8 oz) IBW/kg (Calculated) : 63.9  Temp (24hrs), Avg:97.9 F (36.6 C), Min:97.3 F (36.3 C), Max:98.3 F (36.8 C)  Recent Labs  Lab 01/12/24 1937 01/13/24 0327 01/14/24 0301 01/15/24 0309 01/16/24 0313  WBC 14.1* 12.3* 11.1* 11.0* 11.6*  CREATININE 2.63* 2.48* 2.22* 1.99* 1.81*    Estimated Creatinine Clearance: 20.8 mL/min (A) (by C-G formula based on SCr of 1.81 mg/dL (H)).    Allergies  Allergen Reactions   Alendronate Sodium Other (See Comments)   Bupropion Other (See Comments)    Tremors      Rofecoxib Diarrhea   Alendronate Rash    Antimicrobials this admission: Levofloxacin 4/7 >> Ceftriaxone 4/3 >> 4/7  Microbiology results: 4/5 Ucx: 80k pseudomonas aeruginosa, 70k citrobacter freundii, >100k aerococcus species  Thank you for allowing pharmacy to be a part of this patient's care.  Pricilla Riffle, PharmD, BCPS Clinical Pharmacist 01/16/2024 2:46 PM

## 2024-01-16 NOTE — Progress Notes (Signed)
 Physical Therapy Treatment Patient Details Name: Kerri [name] MRN: [id number] DOB: [birth year] Today's Date: 01/16/2024   History of Present Illness 89 y.o. female who presented with generalized weakness and suspicion for possible kidney stone. Admitted for UTI and AKI on CKD3. 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    PT Comments  PT - Cognition Comments: AxO x 2 pleasant following repeat simple commands.  Hx Dementia.  PT - Cognition Comments: AxO x 2 pleasant following repeat simple commands.  Hx Dementia. Incont.  Assisted OOB was difficult.  General bed mobility comments: increased time and difficulty self scooting requiring Mod Assist.  Severe posterior lean.  Enlarged ABD girth.General transfer comment: any where from Mod to Min Assist present with severe posterior lean.  First assisted from EOB to [facility].  Pt did void/BM, assisted with peri care.  Unsteady.  Right lean.  Posterior lean.  All with poor self awareness and correction.   HIGH FALL RISK   General Gait Details: very limited amb distance of 4 feet, 4 feet, 10 feet, 4 feet with seated rest breaks between.  MAX vc's to "stand tall" and increase R LE steps length. Returned to room in recliner.   Pt plans to return home with prior private duty sitters/Aides. Pt was also going to OP PT.      If plan is discharge home, recommend the following: A little help with walking and/or transfers;A little help with bathing/dressing/bathroom;Assistance with cooking/housework;Direct supervision/assist for medications management;Help with stairs or ramp for entrance;Supervision due to cognitive status;Assist for transportation   Can travel by private vehicle         Equipment Recommendations  None recommended by PT    Recommendations for Other Services       Precautions / Restrictions Precautions Precautions: Fall Precaution/Restrictions Comments: urinary incontinence/wears briefs and LIFT CHAIR Restrictions Weight Bearing Restrictions Per Provider Order: No     Mobility  Bed Mobility Overal bed mobility: Needs Assistance Bed Mobility: Supine to Sit     Supine to sit: Used rails, HOB elevated, Mod assist     General bed mobility comments: increased time and difficulty self scooting requiring Mod Assist.  Severe posterior lean.  Enlarged ABD girth.    Transfers Overall transfer level: Needs assistance Equipment used: Rolling walker (2 wheels), None Transfers: Sit to/from Stand, Bed to chair/wheelchair/BSC Sit to Stand: Mod assist, Max assist Stand pivot transfers: Mod assist, Max assist         General transfer comment: any where from Mod to Min Assist present with severe posterior lean.  First assisted from EOB to [facility].  Pt did void/BM, assisted with peri care.  Unsteady.  Right lean.  Posterior lean.  All with poor self awareness and correction.   HIGH FALL RISK    Ambulation/Gait Ambulation/Gait assistance: Mod assist, Min assist Gait Distance (Feet): 22 Feet Assistive device: Rolling walker (2 wheels) Gait Pattern/deviations: Narrow base of support, Trunk flexed, Decreased dorsiflexion - right, Step-to pattern Gait velocity: decr     General Gait Details: very limited amb distance of 4 feet, 4 feet, 10 feet, 4 feet with seated rest breaks between.  MAX vc's to "stand tall" and increase R LE steps length.   Stairs  Wheelchair Mobility     Tilt Bed    Modified Rankin (Stroke Patients Only)       Balance                                            Communication Communication Communication: No apparent difficulties  Cognition Arousal: Alert Behavior During Therapy: WFL for  tasks assessed/performed   PT - Cognitive impairments: No apparent impairments                       PT - Cognition Comments: AxO x 2 pleasant following repeat simple commands.  Hx Dementia. Following commands: Impaired Following commands impaired: Only follows one step commands consistently    Cueing Cueing Techniques: Verbal cues, Gestural cues, Tactile cues, Visual cues  Exercises      General Comments        Pertinent Vitals/Pain      Home Living                          Prior Function            PT Goals (current goals can now be found in the care plan section) Progress towards PT goals: Progressing toward goals    Frequency    Min 3X/week      PT Plan      Co-evaluation              AM-PAC PT "6 Clicks" Mobility   Outcome Measure  Help needed turning from your back to your side while in a flat bed without using bedrails?: A Lot Help needed moving from lying on your back to sitting on the side of a flat bed without using bedrails?: A Lot Help needed moving to and from a bed to a chair (including a wheelchair)?: A Lot Help needed standing up from a chair using your arms (e.g., wheelchair or bedside chair)?: A Lot Help needed to walk in hospital room?: A Lot Help needed climbing 3-5 steps with a railing? : A Lot 6 Click Score: 12    End of Session Equipment Utilized During Treatment: Gait belt Activity Tolerance: Patient tolerated treatment well Patient left: in chair;with call bell/phone within reach;with chair alarm set;with family/visitor present Nurse Communication: Mobility status PT Visit Diagnosis: Other abnormalities of gait and mobility (R26.89)     Time: 1056-1140 PT Time Calculation (min) (ACUTE ONLY): 44 min  Charges:    $Gait Training: 23-37 mins $Therapeutic Activity: 8-22 mins PT General Charges $$ ACUTE PT VISIT: 1 Visit                    Felecia Shelling  PTA Acute  Rehabilitation Services Office M-F           [phone]

## 2024-01-16 NOTE — TOC Progression Note (Addendum)
 Transition of Care [facility]) - Progression Note    Patient Details  Name: Kerri [name] MRN: [id number] Date of Birth: Nov 18, 1930  Transition of Care [facility]) CM/SW Contact  Howell Rucks, RN Phone Number: 01/16/2024, 9:49 AM  Clinical Narrative:  PT eval completed, recommendation for [facility] PT/OT, dtr agreeable, reports has had [facility] in the past but open to other [facility] agencies.   -10:00am [facility], rep [name], accepted for [facility] PT/OT, added to AVS.      Expected Discharge Plan: Home/Self Care Barriers to Discharge: Continued Medical Work up  Expected Discharge Plan and Services       Living arrangements for the past 2 months: Single Family Home                                       Social Determinants of Health (SDOH) Interventions SDOH Screenings   Food Insecurity: No Food Insecurity (01/13/2024)  Housing: Low Risk  (01/13/2024)  Transportation Needs: No Transportation Needs (01/13/2024)  Utilities: Not At Risk (01/13/2024)  Financial Resource Strain: Low Risk  (06/14/2023)   Received from [facility]  Physical Activity: Insufficiently Active (05/01/2019)  Social Connections: Socially Isolated (01/13/2024)  Stress: No Stress Concern Present (05/01/2019)  Tobacco Use: Low Risk  (01/12/2024)    Readmission Risk Interventions    01/13/2024   10:09 AM 09/10/2022   10:03 AM  Readmission Risk Prevention Plan  Transportation Screening Complete Complete  PCP or Specialist Appt within 5-7 Days Complete   Home Care Screening Complete   Medication Review (RN CM) Complete   Medication Review (RN Care Manager)  Complete  PCP or Specialist appointment within 3-5 days of discharge  Complete  HRI or Home Care Consult  Complete  SW Recovery Care/Counseling Consult  Complete  Palliative Care Screening  Not Applicable  Skilled Nursing Facility  Complete

## 2024-01-17 ENCOUNTER — Encounter: Payer: Medicare Other | Admitting: Physical Therapy

## 2024-01-17 DIAGNOSIS — N179 Acute kidney failure, unspecified: Secondary | ICD-10-CM | POA: Diagnosis not present

## 2024-01-17 LAB — CBC WITH DIFFERENTIAL/PLATELET
Abs Immature Granulocytes: 0.08 10*3/uL — ABNORMAL HIGH (ref 0.00–0.07)
Basophils Absolute: 0.1 10*3/uL (ref 0.0–0.1)
Basophils Relative: 1 %
Eosinophils Absolute: 0.2 10*3/uL (ref 0.0–0.5)
Eosinophils Relative: 2 %
HCT: 36.9 % (ref 36.0–46.0)
Hemoglobin: 11.5 g/dL — ABNORMAL LOW (ref 12.0–15.0)
Immature Granulocytes: 1 %
Lymphocytes Relative: 23 %
Lymphs Abs: 2.4 10*3/uL (ref 0.7–4.0)
MCH: 29 pg (ref 26.0–34.0)
MCHC: 31.2 g/dL (ref 30.0–36.0)
MCV: 93.2 fL (ref 80.0–100.0)
Monocytes Absolute: 0.8 10*3/uL (ref 0.1–1.0)
Monocytes Relative: 7 %
Neutro Abs: 7.2 10*3/uL (ref 1.7–7.7)
Neutrophils Relative %: 66 %
Platelets: 329 10*3/uL (ref 150–400)
RBC: 3.96 MIL/uL (ref 3.87–5.11)
RDW: 14.2 % (ref 11.5–15.5)
WBC: 10.7 10*3/uL — ABNORMAL HIGH (ref 4.0–10.5)
nRBC: 0 % (ref 0.0–0.2)

## 2024-01-17 LAB — CULTURE, URINE COMPREHENSIVE

## 2024-01-17 LAB — MAGNESIUM: Magnesium: 1.9 mg/dL (ref 1.7–2.4)

## 2024-01-17 LAB — COMPREHENSIVE METABOLIC PANEL WITH GFR
ALT: 12 U/L (ref 0–44)
AST: 18 U/L (ref 15–41)
Albumin: 2.8 g/dL — ABNORMAL LOW (ref 3.5–5.0)
Alkaline Phosphatase: 54 U/L (ref 38–126)
Anion gap: 7 (ref 5–15)
BUN: 30 mg/dL — ABNORMAL HIGH (ref 8–23)
CO2: 22 mmol/L (ref 22–32)
Calcium: 8.8 mg/dL — ABNORMAL LOW (ref 8.9–10.3)
Chloride: 112 mmol/L — ABNORMAL HIGH (ref 98–111)
Creatinine, Ser: 1.79 mg/dL — ABNORMAL HIGH (ref 0.44–1.00)
GFR, Estimated: 27 mL/min — ABNORMAL LOW (ref >60)
Glucose, Bld: 121 mg/dL — ABNORMAL HIGH (ref 70–99)
Potassium: 4 mmol/L (ref 3.5–5.1)
Sodium: 141 mmol/L (ref 135–145)
Total Bilirubin: 0.3 mg/dL (ref 0.0–1.2)
Total Protein: 6.5 g/dL (ref 6.5–8.1)

## 2024-01-17 LAB — HEMOGLOBIN A1C
Hgb A1c MFr Bld: 6 % — ABNORMAL HIGH (ref 4.8–5.6)
Mean Plasma Glucose: 126 mg/dL

## 2024-01-17 LAB — PHOSPHORUS: Phosphorus: 3.4 mg/dL (ref 2.5–4.6)

## 2024-01-17 MED ORDER — FOSFOMYCIN TROMETHAMINE 3 G PO PACK: 3.0000 g | PACK | Freq: Once | ORAL | 0 refills | Status: AC

## 2024-01-17 MED ORDER — LEVOFLOXACIN 250 MG PO TABS: 250.0000 mg | ORAL_TABLET | Freq: Every day | ORAL | 0 refills | Status: AC

## 2024-01-17 MED ORDER — SACCHAROMYCES BOULARDII 250 MG PO CAPS: 250.0000 mg | ORAL_CAPSULE | Freq: Two times a day (BID) | ORAL | 0 refills | Status: AC

## 2024-01-17 MED ORDER — FOSFOMYCIN TROMETHAMINE 3 G PO PACK
3.0000 g | PACK | Freq: Once | ORAL | Status: AC
  Administered 2024-01-17: 3 g via ORAL
  Filled 2024-01-17: qty 3

## 2024-01-17 MED ORDER — SACCHAROMYCES BOULARDII 250 MG PO CAPS
250.0000 mg | ORAL_CAPSULE | Freq: Two times a day (BID) | ORAL | Status: DC
  Administered 2024-01-17: 250 mg via ORAL
  Filled 2024-01-17: qty 1

## 2024-01-17 MED ORDER — NITROFURANTOIN MONOHYD MACRO 100 MG PO CAPS: 100.0000 mg | ORAL_CAPSULE | Freq: Two times a day (BID) | ORAL | Status: DC

## 2024-01-17 NOTE — Plan of Care (Signed)
 Patient discharging home via private vehicle with Kerri [name]. AVS reviewed and patient and caregiver verbalize understanding. Haydee Salter, RN 01/17/24 10:32 AM

## 2024-01-17 NOTE — Discharge Summary (Signed)
 Physician Discharge Summary   Patient: Kerri [name] MRN: [id number] DOB: 01/16/1931  Admit date:     01/12/2024  Discharge date: 01/17/24  Discharge Physician: Alba Cory   PCP: Wilford Corner, PA-C   Recommendations at discharge:    Final Urine culture results will need to follow up for Pseudomonas sensitivity.   Discharge Diagnoses: Principal Problem:   AKI (acute kidney injury) (HCC) Active Problems:   UTI (urinary tract infection)   History of recurrent UTI (urinary tract infection)   Chronic kidney disease, stage 3b (HCC)   Chronic systolic CHF (congestive heart failure) (HCC)   Dementia without behavioral disturbance (HCC)   Hypothyroidism   Essential hypertension   Generalized weakness   Status post placement of cardiac pacemaker  Resolved Problems:   * No resolved hospital problems. [facility] Course: 84 with past medical history significant for hypothyroidism, recurrent UTIs, osteoarthritis, prior anterior trochanter fracture, CKD stage IIIb, chronic systolic heart failure, dementia, hypertension, history of cardiac pacemaker, history of normal pressure hydrocephalus, overreactive bladder.  Patient was previously on suppressive Bactrim.  She has been receiving workup outpatient with neurology.  Neurology requested for urinalysis but patient was unable to provide a voided specimen.  She went to urology for catheter catheterization UA.  At urology office reported feeling weak.  Outpatient labs revealed creatinine 2.9.  She was sent to the ED for further evaluation of AKI.    Assessment and Plan: 1-AKI superimposed on CKD stage IIIb: - Creatinine baseline 1.2 - Presented with a creatinine of 2.6 slowly trending down today at 1.9 - Continue to encourage oral intake - On admission CT scan did not show any hydronephrosis or kidney obstruction - Received IV fluids. Cr down to 1.7   2-UTI: - Presented with pyuria and AKI.  Patient unable to provide  history due to dementia - She has been getting IV ceftriaxone since hospitalization. - White blood cell trending down from 14 down to 11 - Initial urine culture multiple bacterial morphotypes - Repeat urine culture 4/05: grew Pseudomonas sensitivity will be available 4/09, Citrobacter sensitivity 4/08 aerococcus.  -Started on  Levaquin, Citrobacter only sensitive to nitrofurantoin. Unable to do nitrofurantoin due to CKD. Discussed with pharmacist, will do Fosfomycin. She will get a dose now prior to discharge. Will repeat another dose in 72 hours.  -Discharge on Levaquin to cover for pseudomonas. Sensitivity pending. Family was advised to follow results with PCP     3-Essential HTN:  - On metoprolol at home, on hold due to bradycardia - Monitor blood pressure   4-Alzheimer's Dementia with vascular dementia: - Continue Namenda and Aricept - Continue Effexor -Follow-up with neurologist for further care of behavioral dementia   5-Hypothyroidism:  - Continue Synthroid   Chronic systolic heart failure - On metoprolol at home, currently on hold due to bradycardia     Generalized weakness -Continue PT OT   Hypoalbuminemia     Liver lesion - 11 mm hypodensity in right lobe of liver most likely related to cyst or hemangioma.  Incidentally seen on CT abdomen pelvis - Outpatient follow-up if needed   Colonic diverticulosis - Incidentally seen on CT - Bowel regimen as needed to promote daily bowel movement   Osteoporosis Chronic compression deformity of L4 - Resume home meds. - PT/OT.          Consultants: None Procedures performed: None Disposition: Home Diet recommendation:  Discharge Diet Orders (From admission, onward)     Start  Ordered   01/17/24 0000  Diet - low sodium heart healthy        01/17/24 0954           Cardiac diet DISCHARGE MEDICATION: Allergies as of 01/17/2024       Reactions   Alendronate Sodium Other (See Comments)   Bupropion Other  (See Comments)   Tremors    Rofecoxib Diarrhea   Alendronate Rash        Medication List     TAKE these medications    acetaminophen 325 MG tablet Commonly known as: TYLENOL Take 2 tablets (650 mg total) by mouth every 6 (six) hours as needed for mild pain, moderate pain or fever. What changed: when to take this   bisacodyl 10 MG suppository Commonly known as: DULCOLAX Place 1 suppository (10 mg total) rectally daily as needed for moderate constipation.   chlorhexidine 4 % external liquid Commonly known as: Hibiclens Apply topically daily as needed.   cyanocobalamin 1000 MCG/ML injection Commonly known as: VITAMIN B12 Inject 1,000 mcg into the muscle every 30 (thirty) days.   docusate sodium 100 MG capsule Commonly known as: COLACE Take 1 capsule (100 mg total) by mouth 2 (two) times daily. What changed:  when to take this reasons to take this   donepezil 10 MG tablet Commonly known as: ARICEPT Take 10 mg by mouth at bedtime.   Fe Fum-Vit C-Vit B12-FA Caps capsule Commonly known as: TRIGELS-F FORTE Take 1 capsule by mouth 2 (two) times daily.   fluticasone 50 MCG/ACT nasal spray Commonly known as: FLONASE Place 1 spray into both nostrils daily as needed for allergies.   folic acid 1 MG tablet Commonly known as: FOLVITE Take 1 mg by mouth daily.   fosfomycin 3 g Pack Commonly known as: MONUROL Take 3 g by mouth once for 1 dose. Start taking on: January 20, 2024   levofloxacin 250 MG tablet Commonly known as: LEVAQUIN Take 1 tablet (250 mg total) by mouth daily for 5 days.   levothyroxine 50 MCG tablet Commonly known as: SYNTHROID Take 50 mcg by mouth daily before breakfast.   memantine 10 MG tablet Commonly known as: NAMENDA Take 10 mg by mouth 2 (two) times daily.   metoprolol succinate 25 MG 24 hr tablet Commonly known as: TOPROL-XL Take 25 mg by mouth daily.   multivitamin with minerals Tabs tablet Take 1 tablet by mouth daily.    polyethylene glycol powder 17 GM/SCOOP powder Commonly known as: GLYCOLAX/MIRALAX Take 17 g by mouth daily as needed for mild constipation or moderate constipation.   potassium chloride 10 MEQ tablet Commonly known as: KLOR-CON Take 10 mEq by mouth daily. Every Monday   saccharomyces boulardii 250 MG capsule Commonly known as: FLORASTOR Take 1 capsule (250 mg total) by mouth 2 (two) times daily.   Trospium Chloride 60 MG Cp24 Take 1 capsule (60 mg total) by mouth daily.   venlafaxine XR 75 MG 24 hr capsule Commonly known as: EFFEXOR-XR Take 75 mg by mouth daily with breakfast.        Follow-up Information     Pa, [facility]. Call in 1 week(s).   Contact information: [address] [place] [address] [phone]         [facility] Follow up.   Why: [facility] Contact information: [address] [place] [address] [phone]  Discharge Exam: Filed Weights   01/15/24 0427 01/16/24 0500 01/17/24 0516  Weight: 81.6 kg 74.8 kg 76.7 kg   General; NAD  Condition at discharge: stable  The results of significant diagnostics from this hospitalization (including imaging, microbiology, ancillary and laboratory) are listed below for reference.   Imaging Studies: CT Renal Stone Study Result Date: 01/12/2024 CLINICAL DATA:  Abdominal and flank pain. EXAM: CT ABDOMEN AND PELVIS WITHOUT CONTRAST TECHNIQUE: Multidetector CT imaging of the abdomen and pelvis was performed following the standard protocol without IV contrast. RADIATION DOSE REDUCTION: This exam was performed according to the departmental dose-optimization program which includes automated exposure control, adjustment of the mA and/or kV according to patient size and/or use of iterative reconstruction technique. COMPARISON:  CT abdomen and pelvis 05/14/2014 FINDINGS: Lower chest: No acute  abnormality. Heart is enlarged. Pacemaker is present. Hepatobiliary: There is an 11 mm hypodensity in the right lobe of the liver most likely related to cyst or hemangioma. Otherwise, the liver, gallbladder and bile ducts are within normal limits. Pancreas: Unremarkable. No pancreatic ductal dilatation or surrounding inflammatory changes. Spleen: Normal in size without focal abnormality. Adrenals/Urinary Tract: There is an 8 mm calculus in the mid left kidney. Otherwise, the kidneys, adrenal glands, and bladder are within normal limits. Stomach/Bowel: Stomach is within normal limits. Appendix appears normal. No evidence of bowel wall thickening, distention, or inflammatory changes. The appendix is not seen. There is diffuse colonic diverticulosis. Vascular/Lymphatic: Aortic atherosclerosis. No enlarged abdominal or pelvic lymph nodes. Reproductive: Status post hysterectomy. No adnexal masses. Other: No abdominal wall hernia or abnormality. No abdominopelvic ascites. Musculoskeletal: The bones are osteopenic. There is chronic appearing compression deformity of L4. Left-sided hip screws and intramedullary nail are present. There are healed bilateral inferior and right superior pubic rami fractures. IMPRESSION: 1. No acute localizing process in the abdomen or pelvis. 2. Nonobstructing left renal calculus. 3. Colonic diverticulosis without evidence for diverticulitis. 4. Cardiomegaly. 5. Aortic atherosclerosis. Aortic Atherosclerosis (ICD10-I70.0). Electronically Signed   By: Darliss Cheney M.D.   On: 01/12/2024 21:13    Microbiology: Results for orders placed or performed during the hospital encounter of 01/12/24  Urine Culture     Status: Abnormal   Collection Time: 01/12/24 10:17 PM   Specimen: Urine, Clean Catch  Result Value Ref Range Status   Specimen Description   Final    URINE, CLEAN CATCH Performed at [facility], [address], [place], [place] [address]    Special Requests    Final    NONE Performed at [facility], [address] [place], [place] [address]    Culture MULTIPLE SPECIES PRESENT, SUGGEST RECOLLECTION (A)  Final   Report Status 01/13/2024 FINAL  Final  Urine Culture (for pregnant, neutropenic or urologic patients or patients with an indwelling urinary catheter)     Status: Abnormal (Preliminary result)   Collection Time: 01/14/24 12:21 PM   Specimen: In/Out Cath Urine  Result Value Ref Range Status   Specimen Description IN/OUT CATH URINE  Final   Special Requests NONE  Final   Culture (A)  Final    80,000 COLONIES/mL PSEUDOMONAS AERUGINOSA 70,000 COLONIES/mL CITROBACTER FREUNDII SUSCEPTIBILITIES TO FOLLOW >=100,000 COLONIES/mL AEROCOCCUS SPECIES Standardized susceptibility testing for this organism is not available.    Report Status PENDING  Incomplete   Organism ID, Bacteria CITROBACTER FREUNDII (A)  Final      Susceptibility   Citrobacter freundii - MIC*    CEFEPIME 1 SENSITIVE Sensitive  CEFTRIAXONE >=64 RESISTANT Resistant     CIPROFLOXACIN 1 RESISTANT Resistant     GENTAMICIN <=1 SENSITIVE Sensitive     IMIPENEM <=0.25 SENSITIVE Sensitive     NITROFURANTOIN <=16 SENSITIVE Sensitive     TRIMETH/SULFA >=320 RESISTANT Resistant     PIP/TAZO Value in next row Intermediate ug/mL     64 INTERMEDIATEPerformed at [facility], [address], [place], [place] [address]    * 70,000 COLONIES/mL CITROBACTER FREUNDII    Labs: CBC: Recent Labs  Lab 01/12/24 1937 01/13/24 0327 01/14/24 0301 01/15/24 0309 01/16/24 0313 01/17/24 0342  WBC 14.1* 12.3* 11.1* 11.0* 11.6* 10.7*  NEUTROABS 10.3*  --  7.1 7.1 7.3 7.2  HGB 14.0 11.7* 11.0* 11.1* 11.9* 11.5*  HCT 44.1 37.1 35.8* 34.8* 38.3 36.9  MCV 90.7 91.2 91.6 90.6 93.6 93.2  PLT 373 328 322 315 325 329   Basic Metabolic Panel: Recent Labs  Lab 01/13/24 0327 01/14/24 0301 01/15/24 0309 01/16/24 0313 01/17/24 0342  NA 142 143 143 141 141  K 4.2 4.1  4.2 4.1 4.0  CL 114* 119* 114* 111 112*  CO2 18* 17* 19* 18* 22  GLUCOSE 116* 118* 121* 110* 121*  BUN 42* 36* 29* 33* 30*  CREATININE 2.48* 2.22* 1.99* 1.81* 1.79*  CALCIUM 9.1 8.9 9.1 9.1 8.8*  MG  --  2.0 1.9 1.9 1.9  PHOS 3.4 3.2 2.9 3.5 3.4   Liver Function Tests: Recent Labs  Lab 01/12/24 1937 01/13/24 0327 01/14/24 0301 01/15/24 0309 01/16/24 0313 01/17/24 0342  AST 16  --  12* 13* 15 18  ALT 11  --  10 10 10 12   ALKPHOS 68  --  65 61 58 54  BILITOT 0.6  --  0.4 0.5 0.4 0.3  PROT 8.6*  --  6.4* 6.7 6.8 6.5  ALBUMIN 3.6 2.9* 2.6* 2.7* 2.8* 2.8*   CBG: No results for input(s): "GLUCAP" in the last 168 hours.  Discharge time spent: greater than 30 minutes.  Signed: Alba Cory, MD [facility] 01/17/2024

## 2024-01-17 NOTE — Care Management Important Message (Signed)
 Important Message  Patient Details IM Letter given to the Patient. Name: Kerri [name] MRN: [id number] Date of Birth: 1931-03-04   Important Message Given:  Yes - Medicare IM     Caren Macadam 01/17/2024, 10:08 AM

## 2024-01-18 ENCOUNTER — Ambulatory Visit: Payer: Medicare Other | Admitting: Physical Therapy

## 2024-01-18 ENCOUNTER — Encounter: Payer: Medicare Other | Admitting: Occupational Therapy

## 2024-01-18 LAB — URINE CULTURE

## 2024-01-19 ENCOUNTER — Encounter: Payer: Medicare Other | Admitting: Physical Therapy

## 2024-01-23 ENCOUNTER — Ambulatory Visit: Payer: Medicare Other

## 2024-01-23 ENCOUNTER — Ambulatory Visit: Payer: Medicare Other | Admitting: Physical Therapy

## 2024-01-24 ENCOUNTER — Encounter: Payer: Medicare Other | Admitting: Physical Therapy

## 2024-01-25 ENCOUNTER — Ambulatory Visit: Payer: Medicare Other | Admitting: Physical Therapy

## 2024-01-26 ENCOUNTER — Encounter: Payer: Medicare Other | Admitting: Physical Therapy

## 2024-01-30 ENCOUNTER — Ambulatory Visit: Payer: Medicare Other | Admitting: Physical Therapy

## 2024-01-30 ENCOUNTER — Ambulatory Visit: Payer: Medicare Other

## 2024-01-31 ENCOUNTER — Encounter: Payer: Medicare Other | Admitting: Physical Therapy

## 2024-02-01 ENCOUNTER — Ambulatory Visit: Payer: Medicare Other | Admitting: Physical Therapy

## 2024-02-02 ENCOUNTER — Encounter: Payer: Medicare Other | Admitting: Physical Therapy

## 2024-02-06 ENCOUNTER — Encounter: Payer: Medicare Other | Admitting: Physical Therapy

## 2024-02-07 ENCOUNTER — Encounter: Payer: Medicare Other | Admitting: Physical Therapy

## 2024-02-08 ENCOUNTER — Encounter: Payer: Medicare Other | Admitting: Physical Therapy

## 2024-02-09 ENCOUNTER — Encounter: Payer: Medicare Other | Admitting: Physical Therapy

## 2024-02-13 ENCOUNTER — Telehealth: Payer: Self-pay

## 2024-02-13 ENCOUNTER — Telehealth: Payer: Self-pay | Admitting: Physical Therapy

## 2024-02-13 ENCOUNTER — Ambulatory Visit: Payer: Medicare Other | Attending: Family Medicine | Admitting: Physical Therapy

## 2024-02-13 ENCOUNTER — Ambulatory Visit: Payer: Medicare Other

## 2024-02-13 DIAGNOSIS — R278 Other lack of coordination: Secondary | ICD-10-CM | POA: Insufficient documentation

## 2024-02-13 DIAGNOSIS — R262 Difficulty in walking, not elsewhere classified: Secondary | ICD-10-CM | POA: Insufficient documentation

## 2024-02-13 DIAGNOSIS — R2681 Unsteadiness on feet: Secondary | ICD-10-CM | POA: Insufficient documentation

## 2024-02-13 DIAGNOSIS — R5381 Other malaise: Secondary | ICD-10-CM | POA: Insufficient documentation

## 2024-02-13 DIAGNOSIS — R269 Unspecified abnormalities of gait and mobility: Secondary | ICD-10-CM | POA: Insufficient documentation

## 2024-02-13 DIAGNOSIS — M6281 Muscle weakness (generalized): Secondary | ICD-10-CM | POA: Insufficient documentation

## 2024-02-13 NOTE — Telephone Encounter (Signed)
 Pt contacted via telephone and author left voice mail informing of missed appointment and informed pt of future PT appointment date and time.   Casimiro Needle, PT, DPT, NCS, CSRS

## 2024-02-13 NOTE — Telephone Encounter (Signed)
 Left message with [name] to call back and let front office know when pt will be ready to restart OT/PT following recent hospitalization. Clinic number provided.   Marcus Sewer, MS, OTR/L

## 2024-02-14 ENCOUNTER — Encounter: Payer: Medicare Other | Admitting: Physical Therapy

## 2024-02-15 ENCOUNTER — Ambulatory Visit: Payer: Medicare Other | Admitting: Physical Therapy

## 2024-02-16 ENCOUNTER — Encounter: Payer: Medicare Other | Admitting: Physical Therapy

## 2024-02-17 ENCOUNTER — Ambulatory Visit (INDEPENDENT_AMBULATORY_CARE_PROVIDER_SITE_OTHER): Admitting: Physician Assistant

## 2024-02-17 VITALS — BP 145/82 | HR 60 | Ht 68.0 in | Wt 165.0 lb

## 2024-02-17 DIAGNOSIS — N390 Urinary tract infection, site not specified: Secondary | ICD-10-CM | POA: Diagnosis not present

## 2024-02-17 LAB — URINALYSIS, COMPLETE
Bilirubin, UA: NEGATIVE
Glucose, UA: NEGATIVE
Ketones, UA: NEGATIVE
Nitrite, UA: POSITIVE — AB
Protein,UA: NEGATIVE
Specific Gravity, UA: 1.02 (ref 1.005–1.030)
Urobilinogen, Ur: 0.2 mg/dL (ref 0.2–1.0)
pH, UA: 6 (ref 5.0–7.5)

## 2024-02-17 LAB — MICROSCOPIC EXAMINATION: WBC, UA: >30 /HPF — AB (ref 0–5)

## 2024-02-17 MED ORDER — FOSFOMYCIN TROMETHAMINE 3 G PO PACK: 3.0000 g | PACK | Freq: Once | ORAL | 0 refills | Status: AC

## 2024-02-17 NOTE — Progress Notes (Signed)
 02/17/2024 1:36 PM   [name] August 01, 1931 [id number]  CC: Chief Complaint  Patient presents with   Recurrent UTI   HPI: Kerri [name] is a 89 y.o. female with PMH CKD, normal pressure hydrocephalus, dementia, bacteriuria of unclear significance previously on suppressive trimethoprim , and OAB went on trospium  ER 60 mg who presents today for evaluation of possible UTI. She is accompanied today by [name] and [name], who contribute to HPI.  Today she reports 2 to 4 days of worsening confusion, decreased ambulation, and intermittent dysuria.  They deny fever, chills, nausea, or vomiting.  In-office catheterized UA today positive for cloudy urine, 1+ blood, nitrates, and 2+ leukocytes; urine microscopy with >30 WBCs/HPF, 3-10 RBCs/HPF, and many bacteria. Measured residual .  PMH: Past Medical History:  Diagnosis Date   Arthritis    Chronic systolic heart failure (HCC)    CKD (chronic kidney disease), stage III (HCC)    Dementia (HCC)    Hypertension    Hypothyroidism    Overactive bladder     Surgical History: Past Surgical History:  Procedure Laterality Date   INTRAMEDULLARY (IM) NAIL INTERTROCHANTERIC Left 09/06/2022   Procedure: INTRAMEDULLARY (IM) NAIL INTERTROCHANTERIC;  Surgeon: Lorri Rota, MD;  Location: [facility] ORS;  Service: Orthopedics;  Laterality: Left;   PACEMAKER LEADLESS INSERTION N/A 01/07/2021   Procedure: PACEMAKER LEADLESS INSERTION;  Surgeon: Percival Brace, MD;  Location: [facility] INVASIVE CV LAB;  Service: Cardiovascular;  Laterality: N/A;   PPM GENERATOR REMOVAL N/A 01/07/2021   Procedure: PPM GENERATOR REMOVAL;  Surgeon: Percival Brace, MD;  Location: [facility] INVASIVE CV LAB;  Service: Cardiovascular;  Laterality: N/A;    Home Medications:  Allergies as of 02/17/2024       Reactions   Alendronate Sodium Other (See Comments)   Bupropion Other (See Comments)   Tremors    Rofecoxib Diarrhea   Alendronate Rash        Medication List         Accurate as of Feb 17, 2024  1:36 PM. If you have any questions, ask your nurse or doctor.          acetaminophen  325 MG tablet Commonly known as: TYLENOL  Take 2 tablets (650 mg total) by mouth every 6 (six) hours as needed for mild pain, moderate pain or fever. What changed: when to take this   bisacodyl  10 MG suppository Commonly known as: DULCOLAX Place 1 suppository (10 mg total) rectally daily as needed for moderate constipation.   chlorhexidine  4 % external liquid Commonly known as: Hibiclens  Apply topically daily as needed.   cyanocobalamin  1000 MCG/ML injection Commonly known as: VITAMIN B12 Inject 1,000 mcg into the muscle every 30 (thirty) days.   docusate sodium  100 MG capsule Commonly known as: COLACE Take 1 capsule (100 mg total) by mouth 2 (two) times daily. What changed:  when to take this reasons to take this   donepezil  10 MG tablet Commonly known as: ARICEPT  Take 10 mg by mouth at bedtime.   Fe Fum-Vit C-Vit B12-FA Caps capsule Commonly known as: TRIGELS-F FORTE Take 1 capsule by mouth 2 (two) times daily.   fluticasone  50 MCG/ACT nasal spray Commonly known as: FLONASE  Place 1 spray into both nostrils daily as needed for allergies.   folic acid  1 MG tablet Commonly known as: FOLVITE  Take 1 mg by mouth daily.   levothyroxine  50 MCG tablet Commonly known as: SYNTHROID  Take 50 mcg by mouth daily before breakfast.   memantine  10 MG tablet Commonly known as:  NAMENDA  Take 10 mg by mouth 2 (two) times daily.   metoprolol  succinate 25 MG 24 hr tablet Commonly known as: TOPROL -XL Take 25 mg by mouth daily.   multivitamin with minerals Tabs tablet Take 1 tablet by mouth daily.   polyethylene glycol powder 17 GM/SCOOP powder Commonly known as: GLYCOLAX /MIRALAX  Take 17 g by mouth daily as needed for mild constipation or moderate constipation.   potassium chloride  10 MEQ tablet Commonly known as: KLOR-CON  Take 10 mEq by mouth daily. Every  Monday   saccharomyces boulardii 250 MG capsule Commonly known as: FLORASTOR Take 1 capsule (250 mg total) by mouth 2 (two) times daily.   Trospium  Chloride 60 MG Cp24 Take 1 capsule (60 mg total) by mouth daily.   venlafaxine  XR 75 MG 24 hr capsule Commonly known as: EFFEXOR -XR Take 75 mg by mouth daily with breakfast.        Allergies:  Allergies  Allergen Reactions   Alendronate Sodium Other (See Comments)   Bupropion Other (See Comments)    Tremors      Rofecoxib Diarrhea   Alendronate Rash    Family History: No family history on file.  Social History:   reports that she has never smoked. She has never been exposed to tobacco smoke. She has never used smokeless tobacco. She reports that she does not drink alcohol and does not use drugs.  Physical Exam: There were no vitals taken for this visit.  Constitutional:  Alert and oriented, no acute distress, nontoxic appearing HEENT: [place], AT Cardiovascular: No clubbing, cyanosis, or edema Respiratory: Normal respiratory effort, no increased work of breathing Skin: No rashes, bruises or suspicious lesions Neurologic: Grossly intact, no focal deficits, moving all 4 extremities Psychiatric: Normal mood and affect  Laboratory Data: Results for orders placed or performed in visit on 02/17/24  Microscopic Examination   Collection Time: 02/17/24  2:01 PM   Urine  Result Value Ref Range   WBC, UA >30 (A) 0 - 5 /hpf   RBC, Urine 3-10 (A) 0 - 2 /hpf   Epithelial Cells (non renal) 0-10 0 - 10 /hpf   Bacteria, UA Many (A) None seen/Few  Urinalysis, Complete   Collection Time: 02/17/24  2:01 PM  Result Value Ref Range   Specific Gravity, UA 1.020 1.005 - 1.030   pH, UA 6.0 5.0 - 7.5   Color, UA Yellow Yellow   Appearance Ur Cloudy (A) Clear   Leukocytes,UA 2+ (A) Negative   Protein,UA Negative Negative/Trace   Glucose, UA Negative Negative   Ketones, UA Negative Negative   RBC, UA 1+ (A) Negative   Bilirubin, UA  Negative Negative   Urobilinogen, Ur 0.2 0.2 - 1.0 mg/dL   Nitrite, UA Positive (A) Negative   Microscopic Examination See below:    Assessment & Plan:   1. Recurrent UTI (Primary) Cath UA appears grossly positive, will start empiric fosfomycin per most recent culture results.  Unfortunately, I do not have any good suppressive options for her with her CKD and MDR urine cultures. - Urinalysis, Complete - CULTURE, URINE COMPREHENSIVE - fosfomycin (MONUROL ) 3 g PACK; Take 3 g by mouth once for 1 dose.  Dispense: 3 g; Refill: 0   Return if symptoms worsen or fail to improve.  Kathreen Pare, PA-C  [facility] [place] [address], [address] [place], [place] [address] [phone]

## 2024-02-20 ENCOUNTER — Ambulatory Visit

## 2024-02-20 ENCOUNTER — Ambulatory Visit: Payer: Medicare Other | Admitting: Physical Therapy

## 2024-02-21 ENCOUNTER — Encounter: Payer: Medicare Other | Admitting: Physical Therapy

## 2024-02-21 LAB — CULTURE, URINE COMPREHENSIVE

## 2024-02-22 ENCOUNTER — Ambulatory Visit: Payer: Medicare Other | Admitting: Physical Therapy

## 2024-02-22 ENCOUNTER — Encounter: Admitting: Occupational Therapy

## 2024-02-23 ENCOUNTER — Encounter: Payer: Medicare Other | Admitting: Physical Therapy

## 2024-02-27 ENCOUNTER — Ambulatory Visit: Admitting: Physical Therapy

## 2024-02-27 ENCOUNTER — Ambulatory Visit

## 2024-02-29 ENCOUNTER — Encounter (HOSPITAL_COMMUNITY): Payer: Self-pay | Admitting: Pharmacist

## 2024-02-29 ENCOUNTER — Encounter

## 2024-02-29 ENCOUNTER — Ambulatory Visit: Admitting: Physical Therapy

## 2024-02-29 ENCOUNTER — Other Ambulatory Visit (HOSPITAL_COMMUNITY): Payer: Self-pay

## 2024-03-07 ENCOUNTER — Ambulatory Visit

## 2024-03-07 ENCOUNTER — Ambulatory Visit: Admitting: Physical Therapy

## 2024-03-07 DIAGNOSIS — R262 Difficulty in walking, not elsewhere classified: Secondary | ICD-10-CM

## 2024-03-07 DIAGNOSIS — R269 Unspecified abnormalities of gait and mobility: Secondary | ICD-10-CM

## 2024-03-07 DIAGNOSIS — R278 Other lack of coordination: Secondary | ICD-10-CM

## 2024-03-07 DIAGNOSIS — M6281 Muscle weakness (generalized): Secondary | ICD-10-CM

## 2024-03-07 DIAGNOSIS — R2681 Unsteadiness on feet: Secondary | ICD-10-CM | POA: Diagnosis present

## 2024-03-07 DIAGNOSIS — R5381 Other malaise: Secondary | ICD-10-CM

## 2024-03-07 NOTE — Therapy (Addendum)
 OUTPATIENT OCCUPATIONAL THERAPY NEURO RECERTIFICATION NOTE  Patient Name: Kerri [name] MRN: [id number] DOB:[birth year], 89 y.o., female Today's Date: 03/09/2024  PCP: Lenell Query, PA-C REFERRING PROVIDER: Devora Folks, Linnell Richardson, MD  END OF SESSION:  OT End of Session - 03/09/24 1907     Visit Number 6    Number of Visits 18    Date for OT Re-Evaluation 05/30/24    OT Start Time 1330    OT Stop Time 1400    OT Time Calculation (min) 30 min    Activity Tolerance Patient tolerated treatment well    Behavior During Therapy WFL for tasks assessed/performed            Past Medical History:  Diagnosis Date   Arthritis    Chronic systolic heart failure (HCC)    CKD (chronic kidney disease), stage III (HCC)    Dementia (HCC)    Hypertension    Hypothyroidism    Overactive bladder    Past Surgical History:  Procedure Laterality Date   INTRAMEDULLARY (IM) NAIL INTERTROCHANTERIC Left 09/06/2022   Procedure: INTRAMEDULLARY (IM) NAIL INTERTROCHANTERIC;  Surgeon: Lorri Rota, MD;  Location: [facility];  Service: Orthopedics;  Laterality: Left;   PACEMAKER LEADLESS INSERTION N/A 01/07/2021   Procedure: PACEMAKER LEADLESS INSERTION;  Surgeon: Percival Brace, MD;  Location: [facility];  Service: Cardiovascular;  Laterality: N/A;   PPM GENERATOR REMOVAL N/A 01/07/2021   Procedure: PPM GENERATOR REMOVAL;  Surgeon: Percival Brace, MD;  Location: [facility];  Service: Cardiovascular;  Laterality: N/A;   Patient Active Problem List   Diagnosis Date Noted   AKI (acute kidney injury) (HCC) 01/12/2024   Acute postoperative anemia due to expected blood loss 09/07/2022   Displaced intertrochanteric fracture of left femur, initial encounter for closed fracture (HCC) 09/05/2022   Orthostatic hypotension 07/12/2022   Diarrhea 07/10/2022   COVID-19 virus infection 07/08/2022   Electrolyte abnormality 07/08/2022   Malnutrition of moderate degree 07/08/2022    Acute delirium 07/08/2022   History of recurrent UTI (urinary tract infection) 01/30/2022   Constipation    UTI (urinary tract infection) 10/15/2021   Elevated brain natriuretic peptide (BNP) level 10/15/2021   Generalized weakness 10/15/2021   Chronic kidney disease, stage 3b (HCC) 10/15/2021   Normal pressure hydrocephalus (HCC) 10/15/2021   Dementia without behavioral disturbance (HCC) 10/15/2021   Hypothyroidism 10/15/2021   Essential hypertension 10/15/2021   Overactive bladder 10/15/2021   Moderate mitral regurgitation 01/13/2021   Status post placement of cardiac pacemaker 01/13/2021   CHB (complete heart block) (HCC) 01/07/2021   Chronic systolic CHF (congestive heart failure) (HCC) 12/17/2020   Postmenopausal osteoporosis 08/26/2016   Mixed Alzheimer's and vascular dementia (HCC) 06/13/2014    ONSET DATE: 09/05/2022  REFERRING DIAG: Intertrochanteric Left Femur Fracture, Lumbar Disc Disease with stenosis  THERAPY DIAG:  Muscle weakness (generalized)  Other lack of coordination  Debility  Rationale for Evaluation and Treatment: Rehabilitation  SUBJECTIVE:  SUBJECTIVE STATEMENT: Caregiver, [name] reports that their goal for OT continues to be for pt to use the L arm more.  Pt accompanied by: [name]-Personal Caregiver  PERTINENT HISTORY:  03/07/24:  Pt returns today to resume therapy services after recent hospitalization for AKI.   Per chart: 89 with past medical history significant for hypothyroidism, recurrent UTIs, osteoarthritis, prior anterior trochanter fracture, CKD stage IIIb, chronic systolic heart failure, dementia, hypertension, history of cardiac pacemaker, history of normal pressure hydrocephalus, overreactive bladder. Patient was previously on suppressive Bactrim . She has been receiving workup  outpatient with neurology. Neurology requested for urinalysis but patient was unable to provide a voided specimen. She went to urology for catheter catheterization UA. At  urology office reported feeling weak. Outpatient labs revealed creatinine 2.9. She was sent to the ED for further evaluation of AKI.   Pt. Is a 89 y.o. female who sustained an Intertrochanteric Left Femur Fracture, Lumbar Disc Disease with Stenosis, Chronic compression Fractures. 09/05/2022, PMHx includes: Mixed Dementia (Alzheimer's Disease, and Vascular Dementia), Pacemaker, Mild spinal canal stenosis L2-3 & L3-4, Hx of UTI.  PRECAUTIONS: Pacemaker (6-8 years)  WEIGHT BEARING RESTRICTIONS: No  PAIN:  Are you having pain? Yes, in the a.m. R leg, back   FALLS: Has patient fallen in last 6 months? No, 3 assisted sitdowns to the flloor- When Pt. feels like she is not able to walk any further.  LIVING ENVIRONMENT: Lives with: [name] Lives in: House Stairs:  One story with a basement; 2 steps to enter from the garage. In step down to the den; and and one step ramp into the kitchen Has following equipment at home: Wheelchair (manual), walker, hospital bed with rails, and a lift chair, BCommode, and shower chair. Gait belt, Life Alert, hand held shower head.  PLOF: Independent  PATIENT GOALS:  To be able to stand, and walk to the bathroom. Left hand -Pt. Is not using her left hand much  OBJECTIVE:  Note: Objective measures were completed at Evaluation unless otherwise noted.  HAND DOMINANCE: Right  ADLs: Now, occasional min A, CGA , eating no change, set up for grooming, set up for UB dressing,same for dressing, CGA toilet transfer, bathing same, mod A to lift legs over tub while seated on chair, before hip fx, occasional help with transfers at night or if tired.  Transfers/ambulation related to ADLs:  Eating: Independent, assist with set-up for cutting food Grooming: Independent set-up, seated  UB Dressing: Independent with set-up LB Dressing: Mod/MaxA pants, and socks Toileting: Assist with toilet transfers, clothing negotiation, Independent toilet hygiene. Some incontinence  episodes Bathing: MaxA Tub Shower transfers: Max A shower transfers  IADLs: Shopping: Dependent. Will accompany at times. Light housekeeping: Occasionally wipes the table, and puts items in the trash, folds laundry when brought sitting Meal Prep: Will help assist with baking/mixing items, no cooking, meals provided for the Pt. Community mobility: Relies on family and friends Medication management: Medication management provided for the Pt. Financial management:  No change from baseline   MOBILITY STATUS: Hx of Falls, Needs assist   FUNCTIONAL OUTCOME MEASURES: TBD  UPPER EXTREMITY ROM:    Active ROM Right eval Right 03/07/24 Left eval Left 03/07/24  Shoulder flexion 119(140) 130 (143) 112(130) 117 (130)  Shoulder abduction 112 160 (170) 98 135 (155)  Shoulder adduction      Shoulder extension      Shoulder internal rotation      Shoulder external rotation      Elbow flexion WNL  WNL   Elbow extension WNL  WNL   Wrist flexion      Wrist extension WNL  WNL   Wrist ulnar deviation      Wrist radial deviation      Wrist pronation      Wrist supination      (Blank rows = not tested)  UPPER EXTREMITY MMT:     MMT Right eval Right 03/07/24 Left eval Left 03/07/24  Shoulder flexion 3-/5 4+/5 within available range 3-/5 3+ within available range  Shoulder abduction 3-/5 4+/5 within available range  3-/5 3+ within available range  Shoulder adduction      Shoulder extension      Shoulder internal rotation      Shoulder external rotation      Middle trapezius      Lower trapezius      Elbow flexion 4/5 4+ 3+/5 4+  Elbow extension 4/5 4+ 3+/5 4+  Wrist flexion    4+  Wrist extension 4/5 4+ 3+/5 4+  Wrist ulnar deviation      Wrist radial deviation      Wrist pronation      Wrist supination      (Blank rows = not tested)  HAND FUNCTION: Grip strength: Right: 29 lbs; Left: 21 lbs; Pinch strength: Lateral: R: 10#, L: 6#, 3pt. Pinch: R: 8#, L: 5# 03/07/24: Grip strength:  Right: 45 lbs; Left: 27 lbs; Pinch strength: Lateral: R: 10 lbs, L: 7 lbs, 3 point pinch: Right: 6 lbs, L: 5#  COORDINATION: TBD 9 Hole Peg test: Right: 1 min. & 2  sec; Left: 2 min. & 1 sec 03/07/24: 9 hole peg test: Right: 46 sec, Left: 1 min 24 sec   SENSATION: Light touch: Inconsistent  COGNITION: Overall cognitive status: History of cognitive impairments - at baseline Alzheimer's Dementia, and Vascular Dementia  VISION: Subjective report:  Glasses all the time   VISION ASSESSMENT: Wears glasses all the time at baseline                                                                                                            TREATMENT DATE: 03/07/24 Therapeutic Activity: Objective measures taken and goals updated and reviewed for recertification.  PATIENT EDUCATION: Education details: OT recert goals/poc Person educated: Patient and Caregiver [name] Education method: Explanation Education comprehension: verbalized understanding  HOME EXERCISE PROGRAM: To be assessed, and provided as indicated  GOALS: Goals reviewed with patient? Yes  SHORT TERM GOALS: Target date: 04/18/2024    Pt. Will require Supervision for BUE HEPs Baseline: Eval: No current HEP; 03/07/24: Will re-establish HEP d/t therapy interrupted with recent hospitalization Goal status: ongoing   LONG TERM GOALS: Target date: 05/30/2024  Pt. Will increase L shoulder strength by 1 mm grade to assist with ADLs/IADLs (revised on 03/07/24). Baseline: Eval: Right: shoulder flexion 3-/5, abduction 3-/5, elbow flexion 4/5, extension 4/5, wrist extension 4/5; Left: shoulder flexion: 3-/5, abduction: 3-/5, elbow flexion 3+/5, extension 3+/5, wrist extension 3+/5; 03/07/24: L shoulder flex/abd 3+/5 within available range Goal status: ongoing  2.  Pt will increase L grip strength by 5 or more lbs to assist with hiking pants (revised on 03/07/24). Baseline: R: 29#, L: 21#; 03/07/24: L grip 27# Goal status: INITIAL  3.  Pt.  Will initiate engaging the left hand during daily activity 75% of the time with minimal cuing Baseline: Eval: Limited initiation and engagement of the LUE during daily tasks.  10-15% engaging; 03/07/24: same as eval Goal status: ongoing  4.  Pt. Will demonstrate compensatory adaptive techniques to assist with ADLs/IADLs. Baseline: Eval: Education to be provided; 03/07/24:  ongoing Goal status: ongoing  ASSESSMENT:5 days kidney failure  HH PT only 3 weeks   CLINICAL IMPRESSION: Pt seen for OT recertication, returning today after therapy interruption since 01/10/24 d/t a 5 day hospitalization for kidney failure and subsequent HH PT for 3 weeks, per caregiver.  Pt and caregiver return today and verbalize continued goal to increase engagement of the LUE into daily tasks.  Objective measures taken today to establish new baselines, and goals updated accordingly.  Pt. continues to benefit from OT services to work on improving LUE functioning in order to improve, and maximize participation with ADLs, and IADL tasks.  PERFORMANCE DEFICITS: in functional skills including ADLs, IADLs, coordination, dexterity, proprioception, ROM, strength, Fine motor control, Gross motor control, mobility, decreased knowledge of use of DME, and UE functional use, cognitive skills including attention, problem solving, and safety awareness, and psychosocial skills including environmental adaptation and routines and behaviors.   IMPAIRMENTS: are limiting patient from ADLs, IADLs, education, and leisure.   CO-MORBIDITIES: has  other co-morbidities that affects occupational performance. Patient will benefit from skilled OT to address above impairments and improve overall function.  MODIFICATION OR ASSISTANCE TO COMPLETE EVALUATION: Min-Moderate modification of tasks or assist with assess necessary to complete an evaluation.  OT OCCUPATIONAL PROFILE AND HISTORY: Detailed assessment: Review of records and additional review of  physical, cognitive, psychosocial history related to current functional performance.  CLINICAL DECISION MAKING: Moderate - several treatment options, min-mod task modification necessary  REHAB POTENTIAL: Good  EVALUATION COMPLEXITY: Moderate    PLAN:  OT FREQUENCY: 1x/week  OT DURATION: 12 weeks  PLANNED INTERVENTIONS: 97168 OT Re-evaluation, 97535 self care/ADL training, 16109 therapeutic exercise, 97530 therapeutic activity, 97112 neuromuscular re-education, 97140 manual therapy, 97018 paraffin, 60454 moist heat, 97034 contrast bath, passive range of motion, functional mobility training, patient/family education, and DME and/or AE instructions  RECOMMENDED OTHER SERVICES: PT  CONSULTED AND AGREED WITH PLAN OF CARE: Patient and family member/caregiver  PLAN FOR NEXT SESSION: see above  Marcus Sewer, MS, OTR/L  03/09/2024, 7:10 PM

## 2024-03-07 NOTE — Therapy (Signed)
 OUTPATIENT PHYSICAL THERAPY NEURO EVALUATION   Patient Name: Kerri [name] MRN: [id number] DOB:09-07-1931, 89 y.o., female Today's Date: 03/07/2024   PCP: Lenell Query, PA-C  REFERRING PROVIDER: Lenell Query, PA-C   END OF SESSION: \  PT End of Session - 03/07/24 1404     Visit Number 1    Number of Visits 24    Date for PT Re-Evaluation 05/30/24    PT Start Time 1401    PT Stop Time 1445    PT Time Calculation (min) 44 min    Equipment Utilized During Treatment Gait belt    Activity Tolerance Patient tolerated treatment well;No increased pain    Behavior During Therapy WFL for tasks assessed/performed             Past Medical History:  Diagnosis Date   Arthritis    Chronic systolic heart failure (HCC)    CKD (chronic kidney disease), stage III (HCC)    Dementia (HCC)    Hypertension    Hypothyroidism    Overactive bladder    Past Surgical History:  Procedure Laterality Date   INTRAMEDULLARY (IM) NAIL INTERTROCHANTERIC Left 09/06/2022   Procedure: INTRAMEDULLARY (IM) NAIL INTERTROCHANTERIC;  Surgeon: Lorri Rota, MD;  Location: [facility] ORS;  Service: Orthopedics;  Laterality: Left;   PACEMAKER LEADLESS INSERTION N/A 01/07/2021   Procedure: PACEMAKER LEADLESS INSERTION;  Surgeon: Percival Brace, MD;  Location: [facility];  Service: Cardiovascular;  Laterality: N/A;   PPM GENERATOR REMOVAL N/A 01/07/2021   Procedure: PPM GENERATOR REMOVAL;  Surgeon: Percival Brace, MD;  Location: [facility];  Service: Cardiovascular;  Laterality: N/A;   Patient Active Problem List   Diagnosis Date Noted   AKI (acute kidney injury) (HCC) 01/12/2024   Acute postoperative anemia due to expected blood loss 09/07/2022   Displaced intertrochanteric fracture of left femur, initial encounter for closed fracture (HCC) 09/05/2022   Orthostatic hypotension 07/12/2022   Diarrhea 07/10/2022   COVID-19 virus infection 07/08/2022   Electrolyte  abnormality 07/08/2022   Malnutrition of moderate degree 07/08/2022   Acute delirium 07/08/2022   History of recurrent UTI (urinary tract infection) 01/30/2022   Constipation    UTI (urinary tract infection) 10/15/2021   Elevated brain natriuretic peptide (BNP) level 10/15/2021   Generalized weakness 10/15/2021   Chronic kidney disease, stage 3b (HCC) 10/15/2021   Normal pressure hydrocephalus (HCC) 10/15/2021   Dementia without behavioral disturbance (HCC) 10/15/2021   Hypothyroidism 10/15/2021   Essential hypertension 10/15/2021   Overactive bladder 10/15/2021   Moderate mitral regurgitation 01/13/2021   Status post placement of cardiac pacemaker 01/13/2021   CHB (complete heart block) (HCC) 01/07/2021   Chronic systolic CHF (congestive heart failure) (HCC) 12/17/2020   Postmenopausal osteoporosis 08/26/2016   Mixed Alzheimer's and vascular dementia (HCC) 06/13/2014    ONSET DATE: Progressive decline over ~1-1.5 years  REFERRING DIAG:  M51.369 (ICD-10-CM) - Other intervertebral disc degeneration, lumbar region without mention of lumbar back pain or lower extremity pain  R53.81 (ICD-10-CM) - Other malaise  M62.81 (ICD-10-CM) - Muscle weakness (generalized)    THERAPY DIAG:  Muscle weakness (generalized)  Difficulty in walking, not elsewhere classified  Abnormality of gait and mobility  Debility  Other lack of coordination  Unsteadiness on feet  Rationale for Evaluation and Treatment: Rehabilitation  SUBJECTIVE:  SUBJECTIVE STATEMENT:  Patient previously known to this clinic. Patient with recent short hospitalization following AKI superimposed on CKI stage III with UTI. Pt received HHPT prior to returning to OPPT.  [name], caregiver, reports having taken patient to MD due to pt having  decreased interest in doing things and then MD found out pt's kidney levels were low, resulting in her hospitalization.   Denies pain. Denies falls/stumbles at home.  Pt accompanied by: caregiver, [name]   PERTINENT HISTORY:  PMH: hypothyroidism, recurrent UTIs, osteoarthritis, prior anterior trochanter fracture, CKD stage IIIb, chronic systolic heart failure, dementia, hypertension, history of cardiac pacemaker, history of normal pressure hydrocephalus, overreactive bladder   Most recent A&P from Neurology note on 12/21/2023: "Mixed dementia (Alzheimer's disease + vascular dementia) with significant ventriculomegaly (with negative lumbar drain trial in 2015) in a patient with history of sepsis secondary to UTI treated with trimethoprim  January 2023. Medication appears to help with UTI and improved mental status - reports good control with current medication Progressive behavioral disturbances with mood swings, outbursts, and apathy, exacerbated during the day. Seroquel (quetiapine) was initiated but caused significant lethargy. Alternative medications, lamotrigine and Depakote, were discussed for her mood-stabilizing properties and different tolerability profiles. - Discuss with primary care provider about reducing Seroquel dose to assess tolerability. Consider 1/2 tablet (12.5 mg) instead.  - If unable to tolerate Seroquel at lower dose, will consider trial of Depakote Valproic Acid is an antiepileptic drug, used for many different conditions. Patient was advised not to stop medication abruptly, not to use alcohol. Acute side-effects can be abdominal pain, N/V, diarrhea and increased ammonia level. Long term side-effects are hair loss, weight gain, tremors and amenorrhea. Rarely, it can cause low platelets, liver failure and birth defects.  - Consider alternative medications such as lamotrigine or Depakote if Seroquel remains intolerable. - Document alternative medications in the chart for future  reference.  Fatigue and somnolence Increased fatigue and somnolence potentially related to Seroquel, hypothyroidism, and medication side effects.  - Monitor heart rate and adjust medications if fatigue persists and heart rate remains low.  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 Symptoms include leg pain, particularly in the right leg, and dragging of the leg during ambulation. Physical therapy has been beneficial. Her symptoms arte chronic. She has had a reportedly negative lower extremity doppler, unavailable for my review.  Right leg pain with inability to pick up feet (right>left - likely from ventriculomegaly + peripheral neuropathy) patient with history of lumbar disc disease with stenosis - reports improvement during recent trial of prednisone. Suspicious for lumbar Disc Disease in addition to decondition Reviewed PCP note: Ongoing leg pain with intermittent weakness. On exam she does have some swelling around the right knee. Unsure if she could have some severe arthritis catching as she walks. Will obtain x-ray of the right knee today. Trial of prednisone 20 mg once a day for 5 days. We did discuss that she has some degenerative lumbar disc disease with stenosis and likely playing a role in right leg weakness. She has a pacemaker and we will defer MRI of the lumbar spine. Keep follow-up with neurology and if they feel it is necessary they could possibly pursue nerve conduction study. Inform caretaker to help patient and stay by her side when walking.   - Patient says that her legs feel "heavy" and that she has difficulty initiating the first step when walking - consistent with magnetic gait in patient  with hydrocephalus and brain atrophy - Previous (-) Lumbar Puncture trial at [facility] (01/09/14). - Continue physical therapy to manage symptoms and improve mobility. - strict Emergency  Room/return precautions "   PAIN:  Are you having pain? No  PRECAUTIONS: Fall and ICD/Pacemaker  RED FLAGS: None and hx of spinal stenosis   WEIGHT BEARING RESTRICTIONS: No  FALLS: Has patient fallen in last 6 months? No  LIVING ENVIRONMENT: Lives with: caregiver 7 days a week. Alone for an hour or 2 a day, but only when alseep.   Lives in: House/apartment Stairs: Yes: Internal: 1 but ramp in place steps; none and External: 1 steps; none Has following equipment at home: Walker - 2 wheeled; transport chair; shower chair and grab bars   PLOF: Needs assistance with ADLs, Needs assistance with gait, Needs assistance with transfers, and Needs assistance with ambulation using RW - Primarily uses transport chair for mobility, but will ambulate with assistance to/from bathroom using RW in the home  PATIENT GOALS: Help her walking be easier and help her get stronger   OBJECTIVE:  Note: Objective measures were completed at Evaluation unless otherwise noted.  DIAGNOSTIC FINDINGS:   EXAM (from 10/28/2022): CT LUMBAR SPINE WITHOUT CONTRAST IMPRESSION: 1. Transitional anatomy. Adhering to convention from the prior lumbar spine MRI, the last fully formed disc space is labeled L5-S1. 2. Unchanged chronic compression fractures of L4 and L5. No new fracture of the lumbar spine. 3. Unchanged lumbar spondylosis notable for moderate narrowing of the right lateral recess at L1-2 and L2-3, as well as mild spinal canal stenosis at L2-3 and L3-4.  Electronically Signed   By: Audra Blend M.D.   On: 10/28/2022 16:42   EXAM (from 09/09/2022): CT HEAD WITHOUT CONTRAST  COMPARISON: CT head 09/05/2022, 08/15/2021   IMPRESSION: 1. No acute abnormality and no change from prior studies. 2. Atrophy and chronic microvascular ischemic change in the white matter.   Electronically Signed   By: Anastasio Balsam M.D.   On: 09/09/2022 11:50    COGNITION: Overall cognitive status: History of  cognitive impairments - at baseline   SENSATION: Light touch: WFL on screen, but pt with hx of neuropathy in B LEs  COORDINATION: Not formally assessed, likely impacted by strength deficits as noted below  EDEMA:  Not formally assessed, but no significant edema noted  MUSCLE TONE: Not formally assessed   MUSCLE LENGTH: Not formally assessed   DTRs:  Not formally assessed   POSTURE: rounded shoulders, forward head, increased thoracic kyphosis, posterior pelvic tilt, and flexed trunk    LOWER EXTREMITY ROM:     Active  Right Eval Left Eval  Hip flexion Limited AROM by weakness Limited AROM by weakness (more impaired than R)  Hip extension Decreased ROM Decreased ROM  Hip abduction    Hip adduction    Hip internal rotation    Hip external rotation    Knee flexion [facility]  Knee extension [facility]  Ankle dorsiflexion [facility]  Ankle plantarflexion [facility] WFL  Ankle inversion    Ankle eversion     (Blank rows = not tested)  LOWER EXTREMITY MMT:    MMT Right Eval Left Eval  Hip flexion 3+ 3-  Hip extension    Hip abduction    Hip adduction    Hip internal rotation    Hip external rotation    Knee flexion 4- 4-  Knee extension 4- 4-  Ankle dorsiflexion 4- 3-  Ankle plantarflexion 3+ 4-  Ankle  inversion    Ankle eversion    (Blank rows = not tested)  Manual Muscle Test Scale 0/5 = No muscle contraction can be seen or felt 1/5 = Contraction can be felt, but there is no motion 2-/5 = Part moves through incomplete ROM w/ gravity decreased 2/5 = Part moves through complete ROM w/ gravity decreased 2+/5 = Part moves through incomplete ROM (<50%) against gravity or through complete ROM w/ gravity 3-/5 = Part moves through incomplete ROM (>50%) against gravity 3/5 = Part moves through complete ROM against gravity 3+/5 = Part moves through complete ROM against gravity/slight resistance 4-/5= Holds test position against slight to moderate pressure 4/5 = Part moves  through complete ROM against gravity/moderate resistance 4+/5= Holds test position against moderate to strong pressure 5/5 = Part moves through complete ROM against gravity/full resistance  BED MOBILITY:  Findings: Sit to supine Min A for B LE management onto mat Supine to sit Min A for trunk upright with continued impaired motor plan Pt doesn't recall previously trained logroll technique to increase her independence, could be due to impaired carryover of technique in the clinic space Caregiver reports bed mobility has improved slightly at home  Pt with significant difficulty performing anterior scooting towards EOM requiring mod A for this   TRANSFERS: Sit to stand: Min A  Assistive device utilized: Environmental consultant - 2 wheeled     Stand to sit: Min A  Assistive device utilized: Environmental consultant - 2 wheeled     Chair to chair: Min A  Assistive device utilized: Environmental consultant - 2 wheeled      *continues to require cuing to turn fully prior to initiating sitting   RAMP:  Not tested  CURB:  Not tested  STAIRS: Not tested GAIT: Findings: Gait Characteristics: step to pattern, step through pattern, decreased step length- Right, decreased step length- Left, decreased stride length, decreased hip/knee flexion- Right, decreased hip/knee flexion- Left, decreased ankle dorsiflexion- Right, decreased ankle dorsiflexion- Left, trunk flexed, narrow BOS, poor foot clearance- Right, and poor foot clearance- Left, Distance walked: ~35ft x2, Assistive device utilized:Walker - 2 wheeled, Level of assistance: Min A, and Comments: severely decreased gait speed  FUNCTIONAL TESTS:  5 times sit to stand: 83 seconds Timed up and go (TUG):  95min54 seconds using RW with min A 10 meter walk test: needs to be assessed  PATIENT SURVEYS:  PsFS: average: 3.67  Patient will completely turn 90degrees prior to sitting down during transfers with minimal cuing: 3  Patient will be able to maintain standing balance using B UE support on RW  with only SBA while aide provides total A for LB clothing or ADLs: 5 Patient will stand with erect/upright posture when walking: 3                                                                                                                              TREATMENT DATE: 03/07/2024    Participated in Timed  Up and Go (TUG): Results: 30min54 seconds using RW with min A for balance safety, especially at end when turing to sit in wheelchair Patient demonstrates high fall risk as indicated by requiring >13.5seconds to complete the TUG.   Five times Sit to Stand Test (FTSS) "Stand up and sit down as quickly as possible 5 times, keeping your arms folded across your chest."    TIME: 23 seconds (83 seconds) from her transport chair with RW in front of her and min progressed to light mod A for lifting into standing at end due to fatigue  Times > 13.6 seconds is associated with increased disability and morbidity (Guralnik, 2000) Times > 15 seconds is predictive of recurrent falls in healthy individuals aged 84 and older (Buatois, et al., 2008) Normal performance values in community dwelling individuals aged 68 and older (Bohannon, 2006): 60-69 years: 11.4 seconds 70-79 years: 12.6 seconds 80-89 years: 14.8 seconds  MCID: >= 2.3 seconds for Vestibular Disorders (Meretta, 2006)    PATIENT EDUCATION: Education details: Findings on assessments today, comparison of pt's CLOF compared to last seen by this therapist, therapy POC and LTGs, education on continuing to encourage pt to stand every ~1 hour and ambulate to/from bathroom using RW and aid's assistance Person educated: Patient and Caregiver [name] Education method: Explanation Education comprehension: verbalized understanding and needs further education  HOME EXERCISE PROGRAM: Need to initiate  GOALS: Goals reviewed with patient? Yes  SHORT TERM GOALS: Target date: 04/18/2024  Patient will be independent in home exercise program to  improve strength/mobility for better functional independence with ADLs.  Baseline: need to initiate Goal status: INITIAL   LONG TERM GOALS: Target date: 05/30/2024  Patient will perform bed mobility with consistently no more than min A utilizing bed features/supports as needed and with no more than than min cuing from aide/caregiver.  Baseline: requires up to mod A and mod/max verbal cuing Goal status: INITIAL  2.  Patient will increase PsFS score to equal to or greater than and average of 2 points to demonstrate statistically significant improvement in mobility and quality of life.  Baseline: 3.667 Goal status: INITIAL  3.  Patient (> 24 years old) will complete five times sit to stand test in < 30 seconds indicating an increased LE strength and improved balance.  Baseline: 83 seconds from her transport chair with RW in front of her and min progressed to light mod A  Goal status: INITIAL   4.  Patient will reduce timed up and go to <60 sec seconds to reduce fall risk and demonstrate improved transfer/gait ability.  Baseline: 34min54 seconds using RW with min A  Goal status: INITIAL  5.  Patient will increase 10 meter walk test by 0.74m/s as to improve gait speed for better household level ambulation and to reduce fall risk.  Baseline: need to assess Goal status: INITIAL   ASSESSMENT:  CLINICAL IMPRESSION: Patient is a 89 y.o. female who was seen today for physical therapy evaluation and treatment for generalized weakness with significant B LE weakness, impaired balance, impaired endurance, impaired motor planning, impaired bed mobility, transfers, and gait due to multiple comorbidities including Alzheimer's disease + vascular dementia. Patient motivated to participate in therapy session despite reporting fatigue towards end. Patient currently requires min/mod assist for basic functional mobility tasks using RW and has limited ability to participate in ambulation at home, primarily only  walking to and/or from her bathroom with caregiver assistance. Otherwise, pt requires dependent navigation in transport chair for all  functional mobility. Patient demonstrates high fall risk as noted on both 5xSTS and TUG objective measures with pt requiring significantly increased time to come to stand from a chair, with assistance, and ambulate a short distance. Ms. Harner will benefit from further skilled PT to improve these deficits in order to increase QOL and ease/safety with ADLs.   OBJECTIVE IMPAIRMENTS: Abnormal gait, decreased activity tolerance, decreased balance, decreased cognition, decreased coordination, decreased endurance, decreased knowledge of condition, decreased knowledge of use of DME, decreased mobility, difficulty walking, decreased ROM, decreased strength, impaired flexibility, impaired sensation, impaired UE functional use, and pain.   ACTIVITY LIMITATIONS: carrying, lifting, sitting, standing, transfers, bed mobility, continence, bathing, toileting, dressing, self feeding, reach over head, hygiene/grooming, and locomotion level  PARTICIPATION LIMITATIONS: meal prep, cleaning, laundry, shopping, and community activity  PERSONAL FACTORS: Age, Time since onset of injury/illness/exacerbation, and 3+ comorbidities: hypothyroidism, recurrent UTIs, osteoarthritis, prior anterior trochanter fracture, CKD stage IIIb, chronic systolic heart failure, dementia, hypertension, history of cardiac pacemaker, history of normal pressure hydrocephalus, overreactive bladder are also affecting patient's functional outcome.   REHAB POTENTIAL: Good  CLINICAL DECISION MAKING: Evolving/moderate complexity  EVALUATION COMPLEXITY: Moderate  PLAN:  PT FREQUENCY: 1-2x/week  PT DURATION: 12 weeks  PLANNED INTERVENTIONS: 97164- PT Re-evaluation, 97750- Physical Performance Testing, 97110-Therapeutic exercises, 97530- Therapeutic activity, 97112- Neuromuscular re-education, 97535- Self Care,  97140- Manual therapy, [id number]- Gait training, 60454- Canalith repositioning, Patient/Family education, Balance training, Stair training, Joint mobilization, Vestibular training, Visual/preceptual remediation/compensation, Cognitive remediation, DME instructions, Cryotherapy, Moist heat, and Biofeedback  PLAN FOR NEXT SESSIONS:  - 10 meter walk test  - hip flexor stretch - supine bridges - sit<>stands - standing reaching overhead/upright for improved hip extension and upright posture - gait training - standing marches/hip flexion for improved foot clearance   Yaroslav Gombos, PT, DPT, NCS, CSRS Physical Therapist - [facility]  9:54 PM 03/07/24

## 2024-03-10 NOTE — Addendum Note (Signed)
 Addended by: Derran Sear K on: 03/10/2024 09:01 PM   Modules accepted: Orders

## 2024-03-12 ENCOUNTER — Ambulatory Visit

## 2024-03-12 ENCOUNTER — Ambulatory Visit: Attending: Family Medicine | Admitting: Physical Therapy

## 2024-03-12 DIAGNOSIS — R5381 Other malaise: Secondary | ICD-10-CM | POA: Diagnosis present

## 2024-03-12 DIAGNOSIS — M6281 Muscle weakness (generalized): Secondary | ICD-10-CM

## 2024-03-12 DIAGNOSIS — R2681 Unsteadiness on feet: Secondary | ICD-10-CM | POA: Diagnosis present

## 2024-03-12 DIAGNOSIS — R278 Other lack of coordination: Secondary | ICD-10-CM

## 2024-03-12 DIAGNOSIS — R262 Difficulty in walking, not elsewhere classified: Secondary | ICD-10-CM

## 2024-03-12 DIAGNOSIS — R269 Unspecified abnormalities of gait and mobility: Secondary | ICD-10-CM

## 2024-03-12 NOTE — Therapy (Signed)
 OUTPATIENT PHYSICAL THERAPY NEURO EVALUATION   Patient Name: Kerri [name] MRN: [id number] DOB:[birth year], 89 y.o., female Today's Date: 03/12/2024   PCP: Lenell Query, PA-C  REFERRING PROVIDER: Lenell Query, PA-C   END OF SESSION: \  PT End of Session - 03/12/24 1318     Visit Number 2    Number of Visits 24    Date for PT Re-Evaluation 05/30/24    PT Start Time 1318    PT Stop Time 1400    PT Time Calculation (min) 42 min    Equipment Utilized During Treatment Gait belt    Activity Tolerance Patient tolerated treatment well;No increased pain    Behavior During Therapy WFL for tasks assessed/performed             Past Medical History:  Diagnosis Date   Arthritis    Chronic systolic heart failure (HCC)    CKD (chronic kidney disease), stage III (HCC)    Dementia (HCC)    Hypertension    Hypothyroidism    Overactive bladder    Past Surgical History:  Procedure Laterality Date   INTRAMEDULLARY (IM) NAIL INTERTROCHANTERIC Left 09/06/2022   Procedure: INTRAMEDULLARY (IM) NAIL INTERTROCHANTERIC;  Surgeon: Lorri Rota, MD;  Location: [facility] ORS;  Service: Orthopedics;  Laterality: Left;   PACEMAKER LEADLESS INSERTION N/A 01/07/2021   Procedure: PACEMAKER LEADLESS INSERTION;  Surgeon: Percival Brace, MD;  Location: [facility];  Service: Cardiovascular;  Laterality: N/A;   PPM GENERATOR REMOVAL N/A 01/07/2021   Procedure: PPM GENERATOR REMOVAL;  Surgeon: Percival Brace, MD;  Location: [facility];  Service: Cardiovascular;  Laterality: N/A;   Patient Active Problem List   Diagnosis Date Noted   AKI (acute kidney injury) (HCC) 01/12/2024   Acute postoperative anemia due to expected blood loss 09/07/2022   Displaced intertrochanteric fracture of left femur, initial encounter for closed fracture (HCC) 09/05/2022   Orthostatic hypotension 07/12/2022   Diarrhea 07/10/2022   COVID-19 virus infection 07/08/2022   Electrolyte  abnormality 07/08/2022   Malnutrition of moderate degree 07/08/2022   Acute delirium 07/08/2022   History of recurrent UTI (urinary tract infection) 01/30/2022   Constipation    UTI (urinary tract infection) 10/15/2021   Elevated brain natriuretic peptide (BNP) level 10/15/2021   Generalized weakness 10/15/2021   Chronic kidney disease, stage 3b (HCC) 10/15/2021   Normal pressure hydrocephalus (HCC) 10/15/2021   Dementia without behavioral disturbance (HCC) 10/15/2021   Hypothyroidism 10/15/2021   Essential hypertension 10/15/2021   Overactive bladder 10/15/2021   Moderate mitral regurgitation 01/13/2021   Status post placement of cardiac pacemaker 01/13/2021   CHB (complete heart block) (HCC) 01/07/2021   Chronic systolic CHF (congestive heart failure) (HCC) 12/17/2020   Postmenopausal osteoporosis 08/26/2016   Mixed Alzheimer's and vascular dementia (HCC) 06/13/2014    ONSET DATE: Progressive decline over ~1-1.5 years  REFERRING DIAG:  M51.369 (ICD-10-CM) - Other intervertebral disc degeneration, lumbar region without mention of lumbar back pain or lower extremity pain  R53.81 (ICD-10-CM) - Other malaise  M62.81 (ICD-10-CM) - Muscle weakness (generalized)    THERAPY DIAG:  Muscle weakness (generalized)  Difficulty in walking, not elsewhere classified  Abnormality of gait and mobility  Debility  Other lack of coordination  Rationale for Evaluation and Treatment: Rehabilitation  SUBJECTIVE:  SUBJECTIVE STATEMENT: From Today:  States that's she is doing well. A little bit of "usual" pain all over, but nothing new.  Went to a car show with son over the weekend.   From Eval.  Patient previously known to this clinic. Patient with recent short hospitalization following AKI superimposed  on CKI stage III with UTI. Pt received HHPT prior to returning to OPPT.  [name], caregiver, reports having taken patient to MD due to pt having decreased interest in doing things and then MD found out pt's kidney levels were low, resulting in her hospitalization.   Denies pain. Denies falls/stumbles at home.  Pt accompanied by: caregiver, [name]   PERTINENT HISTORY:  PMH: hypothyroidism, recurrent UTIs, osteoarthritis, prior anterior trochanter fracture, CKD stage IIIb, chronic systolic heart failure, dementia, hypertension, history of cardiac pacemaker, history of normal pressure hydrocephalus, overreactive bladder   Most recent A&P from Neurology note on 12/21/2023: "Mixed dementia (Alzheimer's disease + vascular dementia) with significant ventriculomegaly (with negative lumbar drain trial in 2015) in a patient with history of sepsis secondary to UTI treated with trimethoprim  January 2023. Medication appears to help with UTI and improved mental status - reports good control with current medication Progressive behavioral disturbances with mood swings, outbursts, and apathy, exacerbated during the day. Seroquel (quetiapine) was initiated but caused significant lethargy. Alternative medications, lamotrigine and Depakote, were discussed for her mood-stabilizing properties and different tolerability profiles. - Discuss with primary care provider about reducing Seroquel dose to assess tolerability. Consider 1/2 tablet (12.5 mg) instead.  - If unable to tolerate Seroquel at lower dose, will consider trial of Depakote Valproic Acid is an antiepileptic drug, used for many different conditions. Patient was advised not to stop medication abruptly, not to use alcohol. Acute side-effects can be abdominal pain, N/V, diarrhea and increased ammonia level. Long term side-effects are hair loss, weight gain, tremors and amenorrhea. Rarely, it can cause low platelets, liver failure and birth defects.  - Consider alternative  medications such as lamotrigine or Depakote if Seroquel remains intolerable. - Document alternative medications in the chart for future reference.  Fatigue and somnolence Increased fatigue and somnolence potentially related to Seroquel, hypothyroidism, and medication side effects.  - Monitor heart rate and adjust medications if fatigue persists and heart rate remains low.  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 Symptoms include leg pain, particularly in the right leg, and dragging of the leg during ambulation. Physical therapy has been beneficial. Her symptoms arte chronic. She has had a reportedly negative lower extremity doppler, unavailable for my review.  Right leg pain with inability to pick up feet (right>left - likely from ventriculomegaly + peripheral neuropathy) patient with history of lumbar disc disease with stenosis - reports improvement during recent trial of prednisone. Suspicious for lumbar Disc Disease in addition to decondition Reviewed PCP note: Ongoing leg pain with intermittent weakness. On exam she does have some swelling around the right knee. Unsure if she could have some severe arthritis catching as she walks. Will obtain x-ray of the right knee today. Trial of prednisone 20 mg once a day for 5 days. We did discuss that she has some degenerative lumbar disc disease with stenosis and likely playing a role in right leg weakness. She has a pacemaker and we will defer MRI of the lumbar spine. Keep follow-up with neurology and if they feel it is necessary they could possibly pursue nerve conduction study. Inform caretaker to help patient  and stay by her side when walking.   - Patient says that her legs feel "heavy" and that she has difficulty initiating the first step when walking - consistent with magnetic gait in patient with hydrocephalus and brain atrophy - Previous (-)  Lumbar Puncture trial at [facility] (01/09/14). - Continue physical therapy to manage symptoms and improve mobility. - strict Emergency Room/return precautions "   PAIN:  Are you having pain? No  PRECAUTIONS: Fall and ICD/Pacemaker  RED FLAGS: None and hx of spinal stenosis   WEIGHT BEARING RESTRICTIONS: No  FALLS: Has patient fallen in last 6 months? No  LIVING ENVIRONMENT: Lives with: caregiver 7 days a week. Alone for an hour or 2 a day, but only when alseep.   Lives in: House/apartment Stairs: Yes: Internal: 1 but ramp in place steps; none and External: 1 steps; none Has following equipment at home: Walker - 2 wheeled; transport chair; shower chair and grab bars   PLOF: Needs assistance with ADLs, Needs assistance with gait, Needs assistance with transfers, and Needs assistance with ambulation using RW - Primarily uses transport chair for mobility, but will ambulate with assistance to/from bathroom using RW in the home  PATIENT GOALS: Help her walking be easier and help her get stronger   OBJECTIVE:  Note: Objective measures were completed at Evaluation unless otherwise noted.  DIAGNOSTIC FINDINGS:   EXAM (from 10/28/2022): CT LUMBAR SPINE WITHOUT CONTRAST IMPRESSION: 1. Transitional anatomy. Adhering to convention from the prior lumbar spine MRI, the last fully formed disc space is labeled L5-S1. 2. Unchanged chronic compression fractures of L4 and L5. No new fracture of the lumbar spine. 3. Unchanged lumbar spondylosis notable for moderate narrowing of the right lateral recess at L1-2 and L2-3, as well as mild spinal canal stenosis at L2-3 and L3-4.  Electronically Signed   By: Audra Blend M.D.   On: 10/28/2022 16:42   EXAM (from 09/09/2022): CT HEAD WITHOUT CONTRAST  COMPARISON: CT head 09/05/2022, 08/15/2021   IMPRESSION: 1. No acute abnormality and no change from prior studies. 2. Atrophy and chronic microvascular ischemic change in the white matter.    Electronically Signed   By: Anastasio Balsam M.D.   On: 09/09/2022 11:50    COGNITION: Overall cognitive status: History of cognitive impairments - at baseline   SENSATION: Light touch: WFL on screen, but pt with hx of neuropathy in B LEs  COORDINATION: Not formally assessed, likely impacted by strength deficits as noted below  EDEMA:  Not formally assessed, but no significant edema noted  MUSCLE TONE: Not formally assessed   MUSCLE LENGTH: Not formally assessed   DTRs:  Not formally assessed   POSTURE: rounded shoulders, forward head, increased thoracic kyphosis, posterior pelvic tilt, and flexed trunk    LOWER EXTREMITY ROM:     Active  Right Eval Left Eval  Hip flexion Limited AROM by weakness Limited AROM by weakness (more impaired than R)  Hip extension Decreased ROM Decreased ROM  Hip abduction    Hip adduction    Hip internal rotation    Hip external rotation    Knee flexion [facility]  Knee extension [facility]  Ankle dorsiflexion [facility]  Ankle plantarflexion [facility] WFL  Ankle inversion    Ankle eversion     (Blank rows = not tested)  LOWER EXTREMITY MMT:    MMT Right Eval Left Eval  Hip flexion 3+ 3-  Hip extension    Hip abduction    Hip adduction  Hip internal rotation    Hip external rotation    Knee flexion 4- 4-  Knee extension 4- 4-  Ankle dorsiflexion 4- 3-  Ankle plantarflexion 3+ 4-  Ankle inversion    Ankle eversion    (Blank rows = not tested)  Manual Muscle Test Scale 0/5 = No muscle contraction can be seen or felt 1/5 = Contraction can be felt, but there is no motion 2-/5 = Part moves through incomplete ROM w/ gravity decreased 2/5 = Part moves through complete ROM w/ gravity decreased 2+/5 = Part moves through incomplete ROM (<50%) against gravity or through complete ROM w/ gravity 3-/5 = Part moves through incomplete ROM (>50%) against gravity 3/5 = Part moves through complete ROM against gravity 3+/5 = Part moves through  complete ROM against gravity/slight resistance 4-/5= Holds test position against slight to moderate pressure 4/5 = Part moves through complete ROM against gravity/moderate resistance 4+/5= Holds test position against moderate to strong pressure 5/5 = Part moves through complete ROM against gravity/full resistance  BED MOBILITY:  Findings: Sit to supine Min A for B LE management onto mat Supine to sit Min A for trunk upright with continued impaired motor plan Pt doesn't recall previously trained logroll technique to increase her independence, could be due to impaired carryover of technique in the clinic space Caregiver reports bed mobility has improved slightly at home  Pt with significant difficulty performing anterior scooting towards EOM requiring mod A for this   TRANSFERS: Sit to stand: Min A  Assistive device utilized: Environmental consultant - 2 wheeled     Stand to sit: Min A  Assistive device utilized: Environmental consultant - 2 wheeled     Chair to chair: Min A  Assistive device utilized: Environmental consultant - 2 wheeled      *continues to require cuing to turn fully prior to initiating sitting   RAMP:  Not tested  CURB:  Not tested  STAIRS: Not tested GAIT: Findings: Gait Characteristics: step to pattern, step through pattern, decreased step length- Right, decreased step length- Left, decreased stride length, decreased hip/knee flexion- Right, decreased hip/knee flexion- Left, decreased ankle dorsiflexion- Right, decreased ankle dorsiflexion- Left, trunk flexed, narrow BOS, poor foot clearance- Right, and poor foot clearance- Left, Distance walked: ~41ft x2, Assistive device utilized:Walker - 2 wheeled, Level of assistance: Min A, and Comments: severely decreased gait speed  FUNCTIONAL TESTS:  5 times sit to stand: 83 seconds Timed up and go (TUG):  92min54 seconds using RW with min A 10 meter walk test: needs to be assessed  PATIENT SURVEYS:  PsFS: average: 3.67  Patient will completely turn 90degrees prior to  sitting down during transfers with minimal cuing: 3  Patient will be able to maintain standing balance using B UE support on RW with only SBA while aide provides total A for LB clothing or ADLs: 5 Patient will stand with erect/upright posture when walking: 3  TREATMENT DATE: 03/12/2024    10 Meter Walk Test: Patient instructed to walk 10 meters (32.8 ft) as quickly and as safely as possible at their normal speed x2 and at a fast speed x2. Time measured from 2 meter mark to 8 meter mark to accommodate ramp-up and ramp-down.  Normal speed 1: 2:07.6 min m/s Normal speed 2: 1:43.81min  m/s Average Normal speed: 0.043m/s (1:55.326min)Cut off scores: <0.4 m/s = household Ambulator, 0.4-0.8 m/s = limited community Ambulator, >0.8 m/s = community Ambulator, >1.2 m/s = crossing a street, <1.0 = increased fall risk MCID 0.05 m/s (small), 0.13 m/s (moderate), 0.06 m/s (significant)  (ANPTA Core Set of Outcome Measures for Adults with Neurologic Conditions, 2018)   Sit<>stand from PT with CGA on first rep to mod assist on last 2 due to BLE/BUE fatigue.   Standing superior reach. To top to mirror x 10 bil with cues for use of only 1 UE at a time 2 x 10 bil difficulty with reciprocal movement, as pt attempting to reach with BUE.  Uncontrolled descent into chair after first bout. Improved return to sitting after second bout.   Foot tap on 4inch step 2x 6 bil. Min cues  and assist to improved posture and adequate weight shift over the LLE to allow full step height on the RLE on step.   Gait with RW to OT gym x 31ft with min assist cues for improved step height on the RLE. Assisted with weight shift, but mildly resistant due to difficulty with midline disorientation.      PATIENT EDUCATION: Education details: Findings on assessments today, comparison of pt's CLOF compared to last  seen by this therapist, therapy POC and LTGs, education on continuing to encourage pt to stand every ~1 hour and ambulate to/from bathroom using RW and aid's assistance Person educated: Patient and Caregiver [name] Education method: Explanation Education comprehension: verbalized understanding and needs further education  HOME EXERCISE PROGRAM: Need to initiate  GOALS: Goals reviewed with patient? Yes  SHORT TERM GOALS: Target date: 04/18/2024  Patient will be independent in home exercise program to improve strength/mobility for better functional independence with ADLs.  Baseline: need to initiate Goal status: INITIAL   LONG TERM GOALS: Target date: 05/30/2024  Patient will perform bed mobility with consistently no more than min A utilizing bed features/supports as needed and with no more than than min cuing from aide/caregiver.  Baseline: requires up to mod A and mod/max verbal cuing Goal status: INITIAL  2.  Patient will increase PsFS score to equal to or greater than and average of 2 points to demonstrate statistically significant improvement in mobility and quality of life.  Baseline: 3.667 Goal status: INITIAL  3.  Patient (> 62 years old) will complete five times sit to stand test in < 30 seconds indicating an increased LE strength and improved balance.  Baseline: 83 seconds from her transport chair with RW in front of her and min progressed to light mod A  Goal status: INITIAL   4.  Patient will reduce timed up and go to <60 sec seconds to reduce fall risk and demonstrate improved transfer/gait ability.  Baseline: 42min54 seconds using RW with min A  Goal status: INITIAL  5.  Patient will increase 10 meter walk test by 0.66m/s as to improve gait speed for better household level ambulation and to reduce fall risk.  Baseline: 0.022m/s with RW  Goal status: INITIAL   ASSESSMENT:  CLINICAL IMPRESSION:  Patient is a 89 y.o.  female who was seen today for physical therapy  evaluation and treatment for generalized weakness with significant B LE weakness, impaired balance, impaired endurance, impaired motor planning, impaired bed mobility, transfers, and gait due to multiple comorbidities including Alzheimer's disease + vascular dementia. Patient motivated to participate in therapy session despite reporting fatigue towards end. performed significant decrease in gait speed requiring ~ 2 MIN to complete. Improved tolerance with sit<>stand with improved mechanics throughout requiring mainly GCA-min assist on this day. Improved step length noted following reciprocal foot tap on step prior to last bout of gait training.  Ms. Poppe will benefit from further skilled PT to improve these deficits in order to increase QOL and ease/safety with ADLs.   OBJECTIVE IMPAIRMENTS: Abnormal gait, decreased activity tolerance, decreased balance, decreased cognition, decreased coordination, decreased endurance, decreased knowledge of condition, decreased knowledge of use of DME, decreased mobility, difficulty walking, decreased ROM, decreased strength, impaired flexibility, impaired sensation, impaired UE functional use, and pain.   ACTIVITY LIMITATIONS: carrying, lifting, sitting, standing, transfers, bed mobility, continence, bathing, toileting, dressing, self feeding, reach over head, hygiene/grooming, and locomotion level  PARTICIPATION LIMITATIONS: meal prep, cleaning, laundry, shopping, and community activity  PERSONAL FACTORS: Age, Time since onset of injury/illness/exacerbation, and 3+ comorbidities: hypothyroidism, recurrent UTIs, osteoarthritis, prior anterior trochanter fracture, CKD stage IIIb, chronic systolic heart failure, dementia, hypertension, history of cardiac pacemaker, history of normal pressure hydrocephalus, overreactive bladder are also affecting patient's functional outcome.   REHAB POTENTIAL: Good  CLINICAL DECISION MAKING: Evolving/moderate  complexity  EVALUATION COMPLEXITY: Moderate  PLAN:  PT FREQUENCY: 1-2x/week  PT DURATION: 12 weeks  PLANNED INTERVENTIONS: 97164- PT Re-evaluation, 97750- Physical Performance Testing, 97110-Therapeutic exercises, 97530- Therapeutic activity, 97112- Neuromuscular re-education, 97535- Self Care, 24401- Manual therapy, [phone]- Gait training, [phone]- Canalith repositioning, Patient/Family education, Balance training, Stair training, Joint mobilization, Vestibular training, Visual/preceptual remediation/compensation, Cognitive remediation, DME instructions, Cryotherapy, Moist heat, and Biofeedback  PLAN FOR NEXT SESSIONS:  - hip flexor stretch - supine bridges - sit<>stands - standing reaching overhead/upright for improved hip extension and upright posture - gait training - standing marches/hip flexion for improved foot clearance   Aurora Lees PT, DPT  Physical Therapist - [facility]  [facility]  2:06 PM 03/12/24

## 2024-03-12 NOTE — Therapy (Unsigned)
 OUTPATIENT OCCUPATIONAL THERAPY NEURO TREATMENT NOTE  Patient Name: Kerri [name] MRN: [id number] DOB:23-Mar-1931, 89 y.o., female Today's Date: 03/12/2024  PCP: Lenell Query, PA-C REFERRING PROVIDER: Devora Folks, Linnell Richardson, MD  END OF SESSION:  OT End of Session - 03/12/24 1340     Visit Number 7    Number of Visits 18    Date for OT Re-Evaluation 05/30/24    OT Start Time 1400    OT Stop Time 1445    OT Time Calculation (min) 45 min    Equipment Utilized During Treatment wc    Activity Tolerance Patient tolerated treatment well    Behavior During Therapy WFL for tasks assessed/performed            Past Medical History:  Diagnosis Date   Arthritis    Chronic systolic heart failure (HCC)    CKD (chronic kidney disease), stage III (HCC)    Dementia (HCC)    Hypertension    Hypothyroidism    Overactive bladder    Past Surgical History:  Procedure Laterality Date   INTRAMEDULLARY (IM) NAIL INTERTROCHANTERIC Left 09/06/2022   Procedure: INTRAMEDULLARY (IM) NAIL INTERTROCHANTERIC;  Surgeon: Lorri Rota, MD;  Location: [facility];  Service: Orthopedics;  Laterality: Left;   PACEMAKER LEADLESS INSERTION N/A 01/07/2021   Procedure: PACEMAKER LEADLESS INSERTION;  Surgeon: Percival Brace, MD;  Location: [facility];  Service: Cardiovascular;  Laterality: N/A;   PPM GENERATOR REMOVAL N/A 01/07/2021   Procedure: PPM GENERATOR REMOVAL;  Surgeon: Percival Brace, MD;  Location: [facility];  Service: Cardiovascular;  Laterality: N/A;   Patient Active Problem List   Diagnosis Date Noted   AKI (acute kidney injury) (HCC) 01/12/2024   Acute postoperative anemia due to expected blood loss 09/07/2022   Displaced intertrochanteric fracture of left femur, initial encounter for closed fracture (HCC) 09/05/2022   Orthostatic hypotension 07/12/2022   Diarrhea 07/10/2022   COVID-19 virus infection 07/08/2022   Electrolyte abnormality 07/08/2022    Malnutrition of moderate degree 07/08/2022   Acute delirium 07/08/2022   History of recurrent UTI (urinary tract infection) 01/30/2022   Constipation    UTI (urinary tract infection) 10/15/2021   Elevated brain natriuretic peptide (BNP) level 10/15/2021   Generalized weakness 10/15/2021   Chronic kidney disease, stage 3b (HCC) 10/15/2021   Normal pressure hydrocephalus (HCC) 10/15/2021   Dementia without behavioral disturbance (HCC) 10/15/2021   Hypothyroidism 10/15/2021   Essential hypertension 10/15/2021   Overactive bladder 10/15/2021   Moderate mitral regurgitation 01/13/2021   Status post placement of cardiac pacemaker 01/13/2021   CHB (complete heart block) (HCC) 01/07/2021   Chronic systolic CHF (congestive heart failure) (HCC) 12/17/2020   Postmenopausal osteoporosis 08/26/2016   Mixed Alzheimer's and vascular dementia (HCC) 06/13/2014   ONSET DATE: 09/05/2022  REFERRING DIAG: Intertrochanteric Left Femur Fracture, Lumbar Disc Disease with stenosis  THERAPY DIAG:  Muscle weakness (generalized)  Other lack of coordination  Debility  Rationale for Evaluation and Treatment: Rehabilitation  SUBJECTIVE:  SUBJECTIVE STATEMENT: Pt reports doing well today. Pt accompanied by: [name]-Personal Caregiver  PERTINENT HISTORY:  03/07/24:  Pt returns today to resume therapy services after recent hospitalization for AKI.   Per chart: 89 with past medical history significant for hypothyroidism, recurrent UTIs, osteoarthritis, prior anterior trochanter fracture, CKD stage IIIb, chronic systolic heart failure, dementia, hypertension, history of cardiac pacemaker, history of normal pressure hydrocephalus, overreactive bladder. Patient was previously on suppressive Bactrim . She has been receiving workup outpatient with neurology. Neurology requested for urinalysis but  patient was unable to provide a voided specimen. She went to urology for catheter catheterization UA. At urology office  reported feeling weak. Outpatient labs revealed creatinine 2.9. She was sent to the ED for further evaluation of AKI.   Pt. Is a 89 y.o. female who sustained an Intertrochanteric Left Femur Fracture, Lumbar Disc Disease with Stenosis, Chronic compression Fractures. 09/05/2022, PMHx includes: Mixed Dementia (Alzheimer's Disease, and Vascular Dementia), Pacemaker, Mild spinal canal stenosis L2-3 & L3-4, Hx of UTI.  PRECAUTIONS: Pacemaker (6-8 years)  WEIGHT BEARING RESTRICTIONS: No  PAIN:  Are you having pain? Yes, in the a.m. R leg, back   FALLS: Has patient fallen in last 6 months? No, 3 assisted sitdowns to the flloor- When Pt. feels like she is not able to walk any further.  LIVING ENVIRONMENT: Lives with: [name] Lives in: House Stairs:  One story with a basement; 2 steps to enter from the garage. In step down to the den; and and one step ramp into the kitchen Has following equipment at home: Wheelchair (manual), walker, hospital bed with rails, and a lift chair, BCommode, and shower chair. Gait belt, Life Alert, hand held shower head.  PLOF: Independent  PATIENT GOALS:  To be able to stand, and walk to the bathroom. Left hand -Pt. Is not using her left hand much  OBJECTIVE:  Note: Objective measures were completed at Evaluation unless otherwise noted.  HAND DOMINANCE: Right  ADLs: Now, occasional min A, CGA , eating no change, set up for grooming, set up for UB dressing,same for dressing, CGA toilet transfer, bathing same, mod A to lift legs over tub while seated on chair, before hip fx, occasional help with transfers at night or if tired.  Transfers/ambulation related to ADLs:  Eating: Independent, assist with set-up for cutting food Grooming: Independent set-up, seated  UB Dressing: Independent with set-up LB Dressing: Mod/MaxA pants, and socks Toileting: Assist with toilet transfers, clothing negotiation, Independent toilet hygiene. Some incontinence episodes Bathing: MaxA Tub  Shower transfers: Max A shower transfers  IADLs: Shopping: Dependent. Will accompany at times. Light housekeeping: Occasionally wipes the table, and puts items in the trash, folds laundry when brought sitting Meal Prep: Will help assist with baking/mixing items, no cooking, meals provided for the Pt. Community mobility: Relies on family and friends Medication management: Medication management provided for the Pt. Financial management:  No change from baseline  MOBILITY STATUS: Hx of Falls, Needs assist   FUNCTIONAL OUTCOME MEASURES: TBD  UPPER EXTREMITY ROM:    Active ROM Right eval Right 03/07/24 Left eval Left 03/07/24  Shoulder flexion 119(140) 130 (143) 112(130) 117 (130)  Shoulder abduction 112 160 (170) 98 135 (155)  Shoulder adduction      Shoulder extension      Shoulder internal rotation      Shoulder external rotation      Elbow flexion WNL  WNL   Elbow extension WNL  WNL   Wrist flexion      Wrist extension WNL  WNL   Wrist ulnar deviation      Wrist radial deviation      Wrist pronation      Wrist supination      (Blank rows = not tested)  UPPER EXTREMITY MMT:     MMT Right eval Right 03/07/24 Left eval Left 03/07/24  Shoulder flexion 3-/5 4+/5 within available range 3-/5 3+ within available range  Shoulder abduction 3-/5 4+/5 within available range 3-/5 3+ within available range  Shoulder adduction  Shoulder extension      Shoulder internal rotation      Shoulder external rotation      Middle trapezius      Lower trapezius      Elbow flexion 4/5 4+ 3+/5 4+  Elbow extension 4/5 4+ 3+/5 4+  Wrist flexion    4+  Wrist extension 4/5 4+ 3+/5 4+  Wrist ulnar deviation      Wrist radial deviation      Wrist pronation      Wrist supination      (Blank rows = not tested)  HAND FUNCTION: Grip strength: Right: 29 lbs; Left: 21 lbs; Pinch strength: Lateral: R: 10#, L: 6#, 3pt. Pinch: R: 8#, L: 5# 03/07/24: Grip strength: Right: 45 lbs; Left: 27 lbs;  Pinch strength: Lateral: R: 10 lbs, L: 7 lbs, 3 point pinch: Right: 6 lbs, L: 5#  COORDINATION:  9 Hole Peg test: Right: 1 min. & 2  sec; Left: 2 min. & 1 sec 03/07/24: 9 hole peg test: Right: 46 sec, Left: 1 min 24 sec   SENSATION: Light touch: Inconsistent  COGNITION: Overall cognitive status: History of cognitive impairments - at baseline Alzheimer's Dementia, and Vascular Dementia  VISION: Subjective report:  Glasses all the time   VISION ASSESSMENT: Wears glasses all the time at baseline                                                                                                            TREATMENT DATE: 03/12/24 Therapeutic Activity: -Facilitated LUE GMC/FMC to increase engagement of LUE into daily tasks  -Activities with deck of cards: pt held deck in R hand and used LUE to flip and sort cards, 1 trial by number, 2nd trial by  Color.  Mod vc/tactile cues to prevent use of R hand for anything other than stabilizing deck.   -Coin manipulation skills: stacking, pick up from table, storing, translatory movements  -1# weight donned to L wrist for increasing GMC challenge while pt practiced item storage, translatory skills, and forward/lateral reaching to pick up dice and poker chips from table top and return to container using L hand. [facility] handout issued to pt/caregiver with review of select activities recommended for pt.   PATIENT EDUCATION: Education details: [facility] HEP Person educated: Patient and Caregiver [name] Education method: Explanation, demo, vc/tactile cues Education comprehension: verbalized understanding, demonstrated understanding  HOME EXERCISE PROGRAM: -Yellow theraputty with visual handout+ -FMC handout  GOALS: Goals reviewed with patient? Yes  SHORT TERM GOALS: Target date: 04/18/2024    Pt. Will require Supervision for BUE HEPs Baseline: Eval: No current HEP; 03/07/24: Will re-establish HEP d/t therapy interrupted with recent hospitalization Goal status:  ongoing   LONG TERM GOALS: Target date: 05/30/2024  Pt. Will increase L shoulder strength by 1 mm grade to assist with ADLs/IADLs (revised on 03/07/24). Baseline: Eval: Right: shoulder flexion 3-/5, abduction 3-/5, elbow flexion 4/5, extension 4/5, wrist extension 4/5; Left: shoulder flexion: 3-/5, abduction: 3-/5, elbow flexion 3+/5, extension 3+/5, wrist extension 3+/5; 03/07/24: L shoulder flex/abd 3+/5  within available range Goal status: ongoing  2.  Pt will increase L grip strength by 5 or more lbs to assist with hiking pants (revised on 03/07/24). Baseline: R: 29#, L: 21#; 03/07/24: L grip 27# Goal status: INITIAL  3.  Pt. Will initiate engaging the left hand during daily activity 75% of the time with minimal cuing Baseline: Eval: Limited initiation and engagement of the LUE during daily tasks.  10-15% engaging; 03/07/24: same as eval Goal status: ongoing  4.  Pt. Will demonstrate compensatory adaptive techniques to assist with ADLs/IADLs. Baseline: Eval: Education to be provided; 03/07/24: ongoing Goal status: ongoing  ASSESSMENT:  CLINICAL IMPRESSION: Pt with good participation in therapeutic activities noted above this date.  Pt required ongoing mod vc and tactile cues to engage the L hand and utilize R hand only as a stabilizer for the deck of cards or the jar of dice/poker chips, OT or caregiver often having to hold pt's R hand on table top to prevent use.  Pt with good tolerance to 1# wrist weight for forward/lateral reaching at or below shoulder level.  Caregiver verbalized understanding of [facility] handout with OT assisting to identify beneficial activities specifically geared to pt's interests within handout.  Pt presented with increased challenge with translatory movements when manipulating dice, poker chips, and coins in hand, requiring increased time to move these items from palm to fingertips.  Pt was able to practice working with up to 3-4 of these items in hand at once with  occasional dropping.  Pt. continues to benefit from OT services to work on improving LUE functioning in order to improve, and maximize participation with ADLs, and IADL tasks.  PERFORMANCE DEFICITS: in functional skills including ADLs, IADLs, coordination, dexterity, proprioception, ROM, strength, Fine motor control, Gross motor control, mobility, decreased knowledge of use of DME, and UE functional use, cognitive skills including attention, problem solving, and safety awareness, and psychosocial skills including environmental adaptation and routines and behaviors.   IMPAIRMENTS: are limiting patient from ADLs, IADLs, education, and leisure.   CO-MORBIDITIES: has  other co-morbidities that affects occupational performance. Patient will benefit from skilled OT to address above impairments and improve overall function.  MODIFICATION OR ASSISTANCE TO COMPLETE EVALUATION: Min-Moderate modification of tasks or assist with assess necessary to complete an evaluation.  OT OCCUPATIONAL PROFILE AND HISTORY: Detailed assessment: Review of records and additional review of physical, cognitive, psychosocial history related to current functional performance.  CLINICAL DECISION MAKING: Moderate - several treatment options, min-mod task modification necessary  REHAB POTENTIAL: Good  EVALUATION COMPLEXITY: Moderate    PLAN:  OT FREQUENCY: 1x/week  OT DURATION: 12 weeks  PLANNED INTERVENTIONS: 97168 OT Re-evaluation, 97535 self care/ADL training, 40981 therapeutic exercise, 97530 therapeutic activity, 97112 neuromuscular re-education, 97140 manual therapy, 97018 paraffin, 19147 moist heat, 97034 contrast bath, passive range of motion, functional mobility training, patient/family education, and DME and/or AE instructions  RECOMMENDED OTHER SERVICES: Pt currently working with PT  CONSULTED AND AGREED WITH PLAN OF CARE: Patient and family member/caregiver  PLAN FOR NEXT SESSION: see above  Marcus Sewer, MS, OTR/L  03/12/2024, 1:41 PM

## 2024-03-14 ENCOUNTER — Encounter

## 2024-03-14 ENCOUNTER — Ambulatory Visit: Admitting: Physical Therapy

## 2024-03-14 DIAGNOSIS — R278 Other lack of coordination: Secondary | ICD-10-CM

## 2024-03-14 DIAGNOSIS — R262 Difficulty in walking, not elsewhere classified: Secondary | ICD-10-CM

## 2024-03-14 DIAGNOSIS — R2681 Unsteadiness on feet: Secondary | ICD-10-CM

## 2024-03-14 DIAGNOSIS — R5381 Other malaise: Secondary | ICD-10-CM

## 2024-03-14 DIAGNOSIS — M6281 Muscle weakness (generalized): Secondary | ICD-10-CM

## 2024-03-14 DIAGNOSIS — R269 Unspecified abnormalities of gait and mobility: Secondary | ICD-10-CM

## 2024-03-14 NOTE — Therapy (Signed)
 OUTPATIENT PHYSICAL THERAPY NEURO TREATMENT   Patient Name: Kerri [name] MRN: [id number] DOB:08/10/1931, 89 y.o., female Today's Date: 03/14/2024   PCP: Kerri Query, PA-C  REFERRING PROVIDER: Lenell Query, PA-C   END OF SESSION:   PT End of Session - 03/14/24 1016     Visit Number 3    Number of Visits 24    Date for PT Re-Evaluation 05/30/24    Authorization Time Period [employer] [id number] for 4 PT vistis from 5/28-6/25 (6/4 is 3 of 4)    PT Start Time 1318    PT Stop Time 1403    PT Time Calculation (min) 45 min    Equipment Utilized During Treatment Gait belt    Activity Tolerance Patient tolerated treatment well;No increased pain    Behavior During Therapy WFL for tasks assessed/performed              Past Medical History:  Diagnosis Date   Arthritis    Chronic systolic heart failure (HCC)    CKD (chronic kidney disease), stage III (HCC)    Dementia (HCC)    Hypertension    Hypothyroidism    Overactive bladder    Past Surgical History:  Procedure Laterality Date   INTRAMEDULLARY (IM) NAIL INTERTROCHANTERIC Left 09/06/2022   Procedure: INTRAMEDULLARY (IM) NAIL INTERTROCHANTERIC;  Surgeon: Lorri Rota, MD;  Location: [facility] ORS;  Service: Orthopedics;  Laterality: Left;   PACEMAKER LEADLESS INSERTION N/A 01/07/2021   Procedure: PACEMAKER LEADLESS INSERTION;  Surgeon: Percival Brace, MD;  Location: [facility];  Service: Cardiovascular;  Laterality: N/A;   PPM GENERATOR REMOVAL N/A 01/07/2021   Procedure: PPM GENERATOR REMOVAL;  Surgeon: Percival Brace, MD;  Location: [facility];  Service: Cardiovascular;  Laterality: N/A;   Patient Active Problem List   Diagnosis Date Noted   AKI (acute kidney injury) (HCC) 01/12/2024   Acute postoperative anemia due to expected blood loss 09/07/2022   Displaced intertrochanteric fracture of left femur, initial encounter for closed fracture (HCC) 09/05/2022   Orthostatic  hypotension 07/12/2022   Diarrhea 07/10/2022   COVID-19 virus infection 07/08/2022   Electrolyte abnormality 07/08/2022   Malnutrition of moderate degree 07/08/2022   Acute delirium 07/08/2022   History of recurrent UTI (urinary tract infection) 01/30/2022   Constipation    UTI (urinary tract infection) 10/15/2021   Elevated brain natriuretic peptide (BNP) level 10/15/2021   Generalized weakness 10/15/2021   Chronic kidney disease, stage 3b (HCC) 10/15/2021   Normal pressure hydrocephalus (HCC) 10/15/2021   Dementia without behavioral disturbance (HCC) 10/15/2021   Hypothyroidism 10/15/2021   Essential hypertension 10/15/2021   Overactive bladder 10/15/2021   Moderate mitral regurgitation 01/13/2021   Status post placement of cardiac pacemaker 01/13/2021   CHB (complete heart block) (HCC) 01/07/2021   Chronic systolic CHF (congestive heart failure) (HCC) 12/17/2020   Postmenopausal osteoporosis 08/26/2016   Mixed Alzheimer's and vascular dementia (HCC) 06/13/2014    ONSET DATE: Progressive decline over ~1-1.5 years  REFERRING DIAG:  M51.369 (ICD-10-CM) - Other intervertebral disc degeneration, lumbar region without mention of lumbar back pain or lower extremity pain  R53.81 (ICD-10-CM) - Other malaise  M62.81 (ICD-10-CM) - Muscle weakness (generalized)    THERAPY DIAG:  Muscle weakness (generalized)  Other lack of coordination  Debility  Difficulty in walking, not elsewhere classified  Abnormality of gait and mobility  Unsteadiness on feet  Rationale for Evaluation and Treatment: Rehabilitation  SUBJECTIVE:  SUBJECTIVE STATEMENT:  Pt reports she is doing good, but is tired. Reports no aches and pains that are too bad. No significant updates from Caregiver. No  stumbles/falls.   From Today:  States that's she is doing well. A little bit of "usual" pain all over, but nothing new.  Went to a car show with son over the weekend.    From Eval: Patient previously known to this clinic. Patient with recent short hospitalization following AKI superimposed on CKI stage III with UTI. Pt received HHPT prior to returning to OPPT.  [name], caregiver, reports having taken patient to MD due to pt having decreased interest in doing things and then MD found out pt's kidney levels were low, resulting in her hospitalization.   Denies pain. Denies falls/stumbles at home.  Pt accompanied by: caregiver, [name]   PERTINENT HISTORY:  PMH: hypothyroidism, recurrent UTIs, osteoarthritis, prior anterior trochanter fracture, CKD stage IIIb, chronic systolic heart failure, dementia, hypertension, history of cardiac pacemaker, history of normal pressure hydrocephalus, overreactive bladder   Most recent A&P from Neurology note on 12/21/2023: "Mixed dementia (Alzheimer's disease + vascular dementia) with significant ventriculomegaly (with negative lumbar drain trial in 2015) in a patient with history of sepsis secondary to UTI treated with trimethoprim  January 2023. Medication appears to help with UTI and improved mental status - reports good control with current medication Progressive behavioral disturbances with mood swings, outbursts, and apathy, exacerbated during the day. Seroquel (quetiapine) was initiated but caused significant lethargy. Alternative medications, lamotrigine and Depakote, were discussed for her mood-stabilizing properties and different tolerability profiles. - Discuss with primary care provider about reducing Seroquel dose to assess tolerability. Consider 1/2 tablet (12.5 mg) instead.  - If unable to tolerate Seroquel at lower dose, will consider trial of Depakote Valproic Acid is an antiepileptic drug, used for many different conditions. Patient was advised not to  stop medication abruptly, not to use alcohol. Acute side-effects can be abdominal pain, N/V, diarrhea and increased ammonia level. Long term side-effects are hair loss, weight gain, tremors and amenorrhea. Rarely, it can cause low platelets, liver failure and birth defects.  - Consider alternative medications such as lamotrigine or Depakote if Seroquel remains intolerable. - Document alternative medications in the chart for future reference.  Fatigue and somnolence Increased fatigue and somnolence potentially related to Seroquel, hypothyroidism, and medication side effects.  - Monitor heart rate and adjust medications if fatigue persists and heart rate remains low.  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 Symptoms include leg pain, particularly in the right leg, and dragging of the leg during ambulation. Physical therapy has been beneficial. Her symptoms arte chronic. She has had a reportedly negative lower extremity doppler, unavailable for my review.  Right leg pain with inability to pick up feet (right>left - likely from ventriculomegaly + peripheral neuropathy) patient with history of lumbar disc disease with stenosis - reports improvement during recent trial of prednisone. Suspicious for lumbar Disc Disease in addition to decondition Reviewed PCP note: Ongoing leg pain with intermittent weakness. On exam she does have some swelling around the right knee. Unsure if she could have some severe arthritis catching as she walks. Will obtain x-ray of the right knee today. Trial of prednisone 20 mg once a day for 5 days. We did discuss that she has some degenerative lumbar disc disease with stenosis and likely playing a role in right leg weakness. She has a pacemaker and we will defer  MRI of the lumbar spine. Keep follow-up with neurology and if they feel it is necessary they could possibly pursue  nerve conduction study. Inform caretaker to help patient and stay by her side when walking.   - Patient says that her legs feel "heavy" and that she has difficulty initiating the first step when walking - consistent with magnetic gait in patient with hydrocephalus and brain atrophy - Previous (-) Lumbar Puncture trial at [facility] (01/09/14). - Continue physical therapy to manage symptoms and improve mobility. - strict Emergency Room/return precautions "   PAIN:  Are you having pain? No  PRECAUTIONS: Fall and ICD/Pacemaker  RED FLAGS: None and hx of spinal stenosis   WEIGHT BEARING RESTRICTIONS: No  FALLS: Has patient fallen in last 6 months? No  LIVING ENVIRONMENT: Lives with: caregiver 7 days a week. Alone for an hour or 2 a day, but only when alseep.   Lives in: House/apartment Stairs: Yes: Internal: 1 but ramp in place steps; none and External: 1 steps; none Has following equipment at home: Walker - 2 wheeled; transport chair; shower chair and grab bars   PLOF: Needs assistance with ADLs, Needs assistance with gait, Needs assistance with transfers, and Needs assistance with ambulation using RW - Primarily uses transport chair for mobility, but will ambulate with assistance to/from bathroom using RW in the home  PATIENT GOALS: Help her walking be easier and help her get stronger   OBJECTIVE:  Note: Objective measures were completed at Evaluation unless otherwise noted.  DIAGNOSTIC FINDINGS:   EXAM (from 10/28/2022): CT LUMBAR SPINE WITHOUT CONTRAST IMPRESSION: 1. Transitional anatomy. Adhering to convention from the prior lumbar spine MRI, the last fully formed disc space is labeled L5-S1. 2. Unchanged chronic compression fractures of L4 and L5. No new fracture of the lumbar spine. 3. Unchanged lumbar spondylosis notable for moderate narrowing of the right lateral recess at L1-2 and L2-3, as well as mild spinal canal stenosis at L2-3 and L3-4.  Electronically Signed   By:  Audra Blend M.D.   On: 10/28/2022 16:42   EXAM (from 09/09/2022): CT HEAD WITHOUT CONTRAST  COMPARISON: CT head 09/05/2022, 08/15/2021   IMPRESSION: 1. No acute abnormality and no change from prior studies. 2. Atrophy and chronic microvascular ischemic change in the white matter.   Electronically Signed   By: Anastasio Balsam M.D.   On: 09/09/2022 11:50    COGNITION: Overall cognitive status: History of cognitive impairments - at baseline   SENSATION: Light touch: WFL on screen, but pt with hx of neuropathy in B LEs  COORDINATION: Not formally assessed, likely impacted by strength deficits as noted below  EDEMA:  Not formally assessed, but no significant edema noted  MUSCLE TONE: Not formally assessed   MUSCLE LENGTH: Not formally assessed   DTRs:  Not formally assessed   POSTURE: rounded shoulders, forward head, increased thoracic kyphosis, posterior pelvic tilt, and flexed trunk    LOWER EXTREMITY ROM:     Active  Right Eval Left Eval  Hip flexion Limited AROM by weakness Limited AROM by weakness (more impaired than R)  Hip extension Decreased ROM Decreased ROM  Hip abduction    Hip adduction    Hip internal rotation    Hip external rotation    Knee flexion [facility]  Knee extension [facility]  Ankle dorsiflexion [facility]  Ankle plantarflexion [facility] WFL  Ankle inversion    Ankle eversion     (Blank rows = not tested)  LOWER EXTREMITY  MMT:    MMT Right Eval Left Eval  Hip flexion 3+ 3-  Hip extension    Hip abduction    Hip adduction    Hip internal rotation    Hip external rotation    Knee flexion 4- 4-  Knee extension 4- 4-  Ankle dorsiflexion 4- 3-  Ankle plantarflexion 3+ 4-  Ankle inversion    Ankle eversion    (Blank rows = not tested)  Manual Muscle Test Scale 0/5 = No muscle contraction can be seen or felt 1/5 = Contraction can be felt, but there is no motion 2-/5 = Part moves through incomplete ROM w/ gravity decreased 2/5 = Part  moves through complete ROM w/ gravity decreased 2+/5 = Part moves through incomplete ROM (<50%) against gravity or through complete ROM w/ gravity 3-/5 = Part moves through incomplete ROM (>50%) against gravity 3/5 = Part moves through complete ROM against gravity 3+/5 = Part moves through complete ROM against gravity/slight resistance 4-/5= Holds test position against slight to moderate pressure 4/5 = Part moves through complete ROM against gravity/moderate resistance 4+/5= Holds test position against moderate to strong pressure 5/5 = Part moves through complete ROM against gravity/full resistance  BED MOBILITY:  Findings: Sit to supine Min A for B LE management onto mat Supine to sit Min A for trunk upright with continued impaired motor plan Pt doesn't recall previously trained logroll technique to increase her independence, could be due to impaired carryover of technique in the clinic space Caregiver reports bed mobility has improved slightly at home  Pt with significant difficulty performing anterior scooting towards EOM requiring mod A for this   TRANSFERS: Sit to stand: Min A  Assistive device utilized: Environmental consultant - 2 wheeled     Stand to sit: Min A  Assistive device utilized: Environmental consultant - 2 wheeled     Chair to chair: Min A  Assistive device utilized: Environmental consultant - 2 wheeled      *continues to require cuing to turn fully prior to initiating sitting   RAMP:  Not tested  CURB:  Not tested  STAIRS: Not tested GAIT: Findings: Gait Characteristics: step to pattern, step through pattern, decreased step length- Right, decreased step length- Left, decreased stride length, decreased hip/knee flexion- Right, decreased hip/knee flexion- Left, decreased ankle dorsiflexion- Right, decreased ankle dorsiflexion- Left, trunk flexed, narrow BOS, poor foot clearance- Right, and poor foot clearance- Left, Distance walked: ~57ft x2, Assistive device utilized:Walker - 2 wheeled, Level of assistance: Min A,  and Comments: severely decreased gait speed  FUNCTIONAL TESTS:  5 times sit to stand: 83 seconds Timed up and go (TUG):  23min54 seconds using RW with min A 10 meter walk test: needs to be assessed  PATIENT SURVEYS:  PsFS: average: 3.67  Patient will completely turn 90degrees prior to sitting down during transfers with minimal cuing: 3  Patient will be able to maintain standing balance using B UE support on RW with only SBA while aide provides total A for LB clothing or ADLs: 5 Patient will stand with erect/upright posture when walking: 3  TREATMENT DATE: 03/14/2024   Pt arrives in her personal transport chair.  L stand pivot transport chair to EOM using RW with min A for lifting to stand Cuing for proper AD management when turning Cuing to push-up from armrests of transport chair to stand Continues to lack full anterior trunk lean/weight shift when initiating coming to stand  Repeated sit<>stands from EOM (placed at transport chair height) performing dual-task of picking up small "basket balls" to shoot through a hoop in standing to promote anterior trunk lean followed by more upright posture once in standing Requires heavy min A to come to stand Repeated cuing to push up with L hand from mat and not pull up on walker with her hands X10reps  Standing for 13min10sec, with RW support, during UE reaching task to external targets on verbal command of therapist to promote upright posture Pt continues to demo bilateral hip abductor weakness with alternating "hip drop" as her LEs fatigue Also resulting in progressively increased knee flexion in standing  Standing with B UE support on RW performing alternating foot taps to 4" white step  2x 10 reps per LE Improved upright posture but with fatigue has progressive worsening hip/knee flexion and forward trunk flexed  posture More instability standing on L LE to lift R LE to step with noticeable R hip flexor weakness as well Decreased R foot clearance   Gait training 37ft using RW with skilled progressively heavier min assist for balance and AD management. Pt demonstrating the following gait deviations with therapist providing the described cuing and facilitation for improvement:  Continues to demo progressively worsening anterior trunk flexed posture Poor R LE foot clearance compared to L Due to impaired L hip stability during L stance & poor R hip flexor strength to pick-up and advance R LE More consistent reciprocal stepping pattern +2 transport chair follow for safety and to allow increased distance   Educated pt and caregiver on pt's limited number of PT visits approved during current insurance authorization despite her need for continued skilled physical therapy. Therapist educated them on local [facility] physical therapy clinic run by [facility] DPT program. Therapist also educated on option of signing pt up for [facility] or [employer] at [employer] to at least allow pt to participate in safe seated exercises such as using the Nustep recumbent bicycle.    PATIENT EDUCATION: Education details: Findings on assessments today, comparison of pt's CLOF compared to last seen by this therapist, therapy POC and LTGs, education on continuing to encourage pt to stand every ~1 hour and ambulate to/from bathroom using RW and aid's assistance Person educated: Patient and Caregiver [name] Education method: Explanation Education comprehension: verbalized understanding and needs further education  HOME EXERCISE PROGRAM: Need to initiate   GOALS: Goals reviewed with patient? Yes  SHORT TERM GOALS: Target date: 04/18/2024  Patient will be independent in home exercise program to improve strength/mobility for better functional independence with ADLs.  Baseline: need to initiate Goal status:  INITIAL   LONG TERM GOALS: Target date: 05/30/2024  Patient will perform bed mobility with consistently no more than min A utilizing bed features/supports as needed and with no more than than min cuing from aide/caregiver.  Baseline: requires up to mod A and mod/max verbal cuing Goal status: INITIAL  2.  Patient will increase PsFS score to equal to or greater than and average of 2 points to demonstrate statistically significant improvement in mobility and quality of life.  Baseline: 3.667 Goal  status: INITIAL  3.  Patient (> 41 years old) will complete five times sit to stand test in < 30 seconds indicating an increased LE strength and improved balance.  Baseline: 83 seconds from her transport chair with RW in front of her and min progressed to light mod A  Goal status: INITIAL   4.  Patient will reduce timed up and go to <60 sec seconds to reduce fall risk and demonstrate improved transfer/gait ability.  Baseline: 80min54 seconds using RW with min A  Goal status: INITIAL  5.  Patient will increase 10 meter walk test by 0.29m/s as to improve gait speed for better household level ambulation and to reduce fall risk.  Baseline: 0.065m/s with RW  Goal status: INITIAL   ASSESSMENT:  CLINICAL IMPRESSION:  Patient is a 89 y.o. female who was seen today for physical therapy treatment for generalized weakness with significant B LE weakness, impaired balance, impaired endurance, impaired motor planning, impaired bed mobility, transfers, and gait due to multiple comorbidities including Alzheimer's disease + vascular dementia. Patient motivated to participate in therapy session despite reporting fatigue. Participated in repeated sit<>stand exercise to promote increased anterior trunk lean/weight shift to increase independence with coming to stand and strength B LEs. Participated in standing alternating foot tap exercise for functional B LE strengthening to improve gait with pt demonstrating improved  foot clearance to tap step. Also participated in gait training reaching 72ft! With pt having more consistent reciprocal stepping pattern and slight improvement in hip/knee extension for upright posture.  Ms. Fullenwider will benefit from further skilled PT to improve these deficits in order to increase QOL and ease/safety with ADLs.   OBJECTIVE IMPAIRMENTS: Abnormal gait, decreased activity tolerance, decreased balance, decreased cognition, decreased coordination, decreased endurance, decreased knowledge of condition, decreased knowledge of use of DME, decreased mobility, difficulty walking, decreased ROM, decreased strength, impaired flexibility, impaired sensation, impaired UE functional use, and pain.   ACTIVITY LIMITATIONS: carrying, lifting, sitting, standing, transfers, bed mobility, continence, bathing, toileting, dressing, self feeding, reach over head, hygiene/grooming, and locomotion level  PARTICIPATION LIMITATIONS: meal prep, cleaning, laundry, shopping, and community activity  PERSONAL FACTORS: Age, Time since onset of injury/illness/exacerbation, and 3+ comorbidities: hypothyroidism, recurrent UTIs, osteoarthritis, prior anterior trochanter fracture, CKD stage IIIb, chronic systolic heart failure, dementia, hypertension, history of cardiac pacemaker, history of normal pressure hydrocephalus, overreactive bladder are also affecting patient's functional outcome.   REHAB POTENTIAL: Good  CLINICAL DECISION MAKING: Evolving/moderate complexity  EVALUATION COMPLEXITY: Moderate  PLAN:  PT FREQUENCY: 1-2x/week  PT DURATION: 12 weeks  PLANNED INTERVENTIONS: 97164- PT Re-evaluation, 97750- Physical Performance Testing, 97110-Therapeutic exercises, 97530- Therapeutic activity, 97112- Neuromuscular re-education, 97535- Self Care, 86578- Manual therapy, [phone]- Gait training, [phone]- Canalith repositioning, Patient/Family education, Balance training, Stair training, Joint mobilization, Vestibular  training, Visual/preceptual remediation/compensation, Cognitive remediation, DME instructions, Cryotherapy, Moist heat, and Biofeedback  PLAN FOR NEXT SESSIONS:   - may be end of insurance auth  - plan for potential D/C - show [employer]  - show Nustep - ensure HEP up to date   - hip flexor stretch - supine bridges - sit<>stands - standing reaching overhead/upright for improved hip extension and upright posture - gait training - standing marches/hip flexion for improved foot clearance   Sanora Cunanan, PT, DPT, NCS, CSRS Physical Therapist - [place]  [facility]  2:05 PM 03/14/24

## 2024-03-19 ENCOUNTER — Ambulatory Visit: Admitting: Physical Therapy

## 2024-03-19 ENCOUNTER — Ambulatory Visit

## 2024-03-19 NOTE — Therapy (Incomplete)
 OUTPATIENT PHYSICAL THERAPY NEURO TREATMENT   Patient Name: Kerri [name] MRN: [id number] DOB:[birth year], 89 y.o., female Today's Date: 03/19/2024   PCP: Lenell Query, PA-C  REFERRING PROVIDER: Lenell Query, PA-C   END OF SESSION: ***     Past Medical History:  Diagnosis Date   Arthritis    Chronic systolic heart failure (HCC)    CKD (chronic kidney disease), stage III (HCC)    Dementia (HCC)    Hypertension    Hypothyroidism    Overactive bladder    Past Surgical History:  Procedure Laterality Date   INTRAMEDULLARY (IM) NAIL INTERTROCHANTERIC Left 09/06/2022   Procedure: INTRAMEDULLARY (IM) NAIL INTERTROCHANTERIC;  Surgeon: Lorri Rota, MD;  Location: [facility] ORS;  Service: Orthopedics;  Laterality: Left;   PACEMAKER LEADLESS INSERTION N/A 01/07/2021   Procedure: PACEMAKER LEADLESS INSERTION;  Surgeon: Percival Brace, MD;  Location: [facility] INVASIVE [facility];  Service: Cardiovascular;  Laterality: N/A;   PPM GENERATOR REMOVAL N/A 01/07/2021   Procedure: PPM GENERATOR REMOVAL;  Surgeon: Percival Brace, MD;  Location: [facility] INVASIVE CV LAB;  Service: Cardiovascular;  Laterality: N/A;   Patient Active Problem List   Diagnosis Date Noted   AKI (acute kidney injury) (HCC) 01/12/2024   Acute postoperative anemia due to expected blood loss 09/07/2022   Displaced intertrochanteric fracture of left femur, initial encounter for closed fracture (HCC) 09/05/2022   Orthostatic hypotension 07/12/2022   Diarrhea 07/10/2022   COVID-19 virus infection 07/08/2022   Electrolyte abnormality 07/08/2022   Malnutrition of moderate degree 07/08/2022   Acute delirium 07/08/2022   History of recurrent UTI (urinary tract infection) 01/30/2022   Constipation    UTI (urinary tract infection) 10/15/2021   Elevated brain natriuretic peptide (BNP) level 10/15/2021   Generalized weakness 10/15/2021   Chronic kidney disease, stage 3b (HCC) 10/15/2021   Normal pressure  hydrocephalus (HCC) 10/15/2021   Dementia without behavioral disturbance (HCC) 10/15/2021   Hypothyroidism 10/15/2021   Essential hypertension 10/15/2021   Overactive bladder 10/15/2021   Moderate mitral regurgitation 01/13/2021   Status post placement of cardiac pacemaker 01/13/2021   CHB (complete heart block) (HCC) 01/07/2021   Chronic systolic CHF (congestive heart failure) (HCC) 12/17/2020   Postmenopausal osteoporosis 08/26/2016   Mixed Alzheimer's and vascular dementia (HCC) 06/13/2014    ONSET DATE: Progressive decline over ~1-1.5 years  REFERRING DIAG:  M51.369 (ICD-10-CM) - Other intervertebral disc degeneration, lumbar region without mention of lumbar back pain or lower extremity pain  R53.81 (ICD-10-CM) - Other malaise  M62.81 (ICD-10-CM) - Muscle weakness (generalized)    THERAPY DIAG: *** No diagnosis found.  Rationale for Evaluation and Treatment: Rehabilitation  SUBJECTIVE:  SUBJECTIVE STATEMENT:  ***  Pt reports she is doing good, but is tired. Reports no aches and pains that are too bad. No significant updates from Caregiver. No stumbles/falls.   ***   From Eval: Patient previously known to this clinic. Patient with recent short hospitalization following AKI superimposed on CKI stage III with UTI. Pt received HHPT prior to returning to OPPT.  [name], caregiver, reports having taken patient to MD due to pt having decreased interest in doing things and then MD found out pt's kidney levels were low, resulting in her hospitalization.   Denies pain. Denies falls/stumbles at home.  Pt accompanied by: caregiver, [name]   PERTINENT HISTORY:  PMH: hypothyroidism, recurrent UTIs, osteoarthritis, prior anterior trochanter fracture, CKD stage IIIb, chronic systolic heart failure,  dementia, hypertension, history of cardiac pacemaker, history of normal pressure hydrocephalus, overreactive bladder   Most recent A&P from Neurology note on 12/21/2023: "Mixed dementia (Alzheimer's disease + vascular dementia) with significant ventriculomegaly (with negative lumbar drain trial in 2015) in a patient with history of sepsis secondary to UTI treated with trimethoprim  January 2023. Medication appears to help with UTI and improved mental status - reports good control with current medication Progressive behavioral disturbances with mood swings, outbursts, and apathy, exacerbated during the day. Seroquel (quetiapine) was initiated but caused significant lethargy. Alternative medications, lamotrigine and Depakote, were discussed for her mood-stabilizing properties and different tolerability profiles. - Discuss with primary care provider about reducing Seroquel dose to assess tolerability. Consider 1/2 tablet (12.5 mg) instead.  - If unable to tolerate Seroquel at lower dose, will consider trial of Depakote Valproic Acid is an antiepileptic drug, used for many different conditions. Patient was advised not to stop medication abruptly, not to use alcohol. Acute side-effects can be abdominal pain, N/V, diarrhea and increased ammonia level. Long term side-effects are hair loss, weight gain, tremors and amenorrhea. Rarely, it can cause low platelets, liver failure and birth defects.  - Consider alternative medications such as lamotrigine or Depakote if Seroquel remains intolerable. - Document alternative medications in the chart for future reference.  Fatigue and somnolence Increased fatigue and somnolence potentially related to Seroquel, hypothyroidism, and medication side effects.  - Monitor heart rate and adjust medications if fatigue persists and heart rate remains low.  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 Symptoms include leg pain, particularly in the right leg, and dragging of the leg during ambulation. Physical therapy has been beneficial. Her symptoms arte chronic. She has had a reportedly negative lower extremity doppler, unavailable for my review.  Right leg pain with inability to pick up feet (right>left - likely from ventriculomegaly + peripheral neuropathy) patient with history of lumbar disc disease with stenosis - reports improvement during recent trial of prednisone. Suspicious for lumbar Disc Disease in addition to decondition Reviewed PCP note: Ongoing leg pain with intermittent weakness. On exam she does have some swelling around the right knee. Unsure if she could have some severe arthritis catching as she walks. Will obtain x-ray of the right knee today. Trial of prednisone 20 mg once a day for 5 days. We did discuss that she has some degenerative lumbar disc disease with stenosis and likely playing a role in right leg weakness. She has a pacemaker and we will defer MRI of the lumbar spine. Keep follow-up with neurology and if they feel it is necessary they could possibly pursue nerve conduction study. Inform caretaker to help patient and  stay by her side when walking.   - Patient says that her legs feel "heavy" and that she has difficulty initiating the first step when walking - consistent with magnetic gait in patient with hydrocephalus and brain atrophy - Previous (-) Lumbar Puncture trial at [facility] (01/09/14). - Continue physical therapy to manage symptoms and improve mobility. - strict Emergency Room/return precautions "   PAIN:  Are you having pain? No  PRECAUTIONS: Fall and ICD/Pacemaker  RED FLAGS: None and hx of spinal stenosis   WEIGHT BEARING RESTRICTIONS: No  FALLS: Has patient fallen in last 6 months? No  LIVING ENVIRONMENT: Lives with: caregiver 7 days a week. Alone for an hour or 2 a day, but only when alseep.   Lives in:  House/apartment Stairs: Yes: Internal: 1 but ramp in place steps; none and External: 1 steps; none Has following equipment at home: Walker - 2 wheeled; transport chair; shower chair and grab bars   PLOF: Needs assistance with ADLs, Needs assistance with gait, Needs assistance with transfers, and Needs assistance with ambulation using RW - Primarily uses transport chair for mobility, but will ambulate with assistance to/from bathroom using RW in the home  PATIENT GOALS: Help her walking be easier and help her get stronger   OBJECTIVE:  Note: Objective measures were completed at Evaluation unless otherwise noted.  DIAGNOSTIC FINDINGS:   EXAM (from 10/28/2022): CT LUMBAR SPINE WITHOUT CONTRAST IMPRESSION: 1. Transitional anatomy. Adhering to convention from the prior lumbar spine MRI, the last fully formed disc space is labeled L5-S1. 2. Unchanged chronic compression fractures of L4 and L5. No new fracture of the lumbar spine. 3. Unchanged lumbar spondylosis notable for moderate narrowing of the right lateral recess at L1-2 and L2-3, as well as mild spinal canal stenosis at L2-3 and L3-4.  Electronically Signed   By: Audra Blend M.D.   On: 10/28/2022 16:42   EXAM (from 09/09/2022): CT HEAD WITHOUT CONTRAST  COMPARISON: CT head 09/05/2022, 08/15/2021   IMPRESSION: 1. No acute abnormality and no change from prior studies. 2. Atrophy and chronic microvascular ischemic change in the white matter.   Electronically Signed   By: Anastasio Balsam M.D.   On: 09/09/2022 11:50    COGNITION: Overall cognitive status: History of cognitive impairments - at baseline   SENSATION: Light touch: WFL on screen, but pt with hx of neuropathy in B LEs  COORDINATION: Not formally assessed, likely impacted by strength deficits as noted below  EDEMA:  Not formally assessed, but no significant edema noted  MUSCLE TONE: Not formally assessed   MUSCLE LENGTH: Not formally assessed    DTRs:  Not formally assessed   POSTURE: rounded shoulders, forward head, increased thoracic kyphosis, posterior pelvic tilt, and flexed trunk    LOWER EXTREMITY ROM:     Active  Right Eval Left Eval  Hip flexion Limited AROM by weakness Limited AROM by weakness (more impaired than R)  Hip extension Decreased ROM Decreased ROM  Hip abduction    Hip adduction    Hip internal rotation    Hip external rotation    Knee flexion [facility]  Knee extension [facility]  Ankle dorsiflexion [facility]  Ankle plantarflexion [facility] WFL  Ankle inversion    Ankle eversion     (Blank rows = not tested)  LOWER EXTREMITY MMT:    MMT Right Eval Left Eval  Hip flexion 3+ 3-  Hip extension    Hip abduction    Hip adduction  Hip internal rotation    Hip external rotation    Knee flexion 4- 4-  Knee extension 4- 4-  Ankle dorsiflexion 4- 3-  Ankle plantarflexion 3+ 4-  Ankle inversion    Ankle eversion    (Blank rows = not tested)  Manual Muscle Test Scale 0/5 = No muscle contraction can be seen or felt 1/5 = Contraction can be felt, but there is no motion 2-/5 = Part moves through incomplete ROM w/ gravity decreased 2/5 = Part moves through complete ROM w/ gravity decreased 2+/5 = Part moves through incomplete ROM (<50%) against gravity or through complete ROM w/ gravity 3-/5 = Part moves through incomplete ROM (>50%) against gravity 3/5 = Part moves through complete ROM against gravity 3+/5 = Part moves through complete ROM against gravity/slight resistance 4-/5= Holds test position against slight to moderate pressure 4/5 = Part moves through complete ROM against gravity/moderate resistance 4+/5= Holds test position against moderate to strong pressure 5/5 = Part moves through complete ROM against gravity/full resistance  BED MOBILITY:  Findings: Sit to supine Min A for B LE management onto mat Supine to sit Min A for trunk upright with continued impaired motor plan Pt doesn't recall  previously trained logroll technique to increase her independence, could be due to impaired carryover of technique in the clinic space Caregiver reports bed mobility has improved slightly at home  Pt with significant difficulty performing anterior scooting towards EOM requiring mod A for this   TRANSFERS: Sit to stand: Min A  Assistive device utilized: Environmental consultant - 2 wheeled     Stand to sit: Min A  Assistive device utilized: Environmental consultant - 2 wheeled     Chair to chair: Min A  Assistive device utilized: Environmental consultant - 2 wheeled      *continues to require cuing to turn fully prior to initiating sitting   RAMP:  Not tested  CURB:  Not tested  STAIRS: Not tested GAIT: Findings: Gait Characteristics: step to pattern, step through pattern, decreased step length- Right, decreased step length- Left, decreased stride length, decreased hip/knee flexion- Right, decreased hip/knee flexion- Left, decreased ankle dorsiflexion- Right, decreased ankle dorsiflexion- Left, trunk flexed, narrow BOS, poor foot clearance- Right, and poor foot clearance- Left, Distance walked: ~10ft x2, Assistive device utilized:Walker - 2 wheeled, Level of assistance: Min A, and Comments: severely decreased gait speed  FUNCTIONAL TESTS:  5 times sit to stand: 83 seconds Timed up and go (TUG):  69min54 seconds using RW with min A 10 meter walk test: needs to be assessed  PATIENT SURVEYS:  PsFS: average: 3.67  Patient will completely turn 90degrees prior to sitting down during transfers with minimal cuing: 3  Patient will be able to maintain standing balance using B UE support on RW with only SBA while aide provides total A for LB clothing or ADLs: 5 Patient will stand with erect/upright posture when walking: 3  TREATMENT DATE: 03/19/2024   ***  Pt arrives in her personal transport chair.  L stand pivot  transport chair to EOM using RW with min A for lifting to stand Cuing for proper AD management when turning Cuing to push-up from armrests of transport chair to stand Continues to lack full anterior trunk lean/weight shift when initiating coming to stand  Repeated sit<>stands from EOM (placed at transport chair height) performing dual-task of picking up small "basket balls" to shoot through a hoop in standing to promote anterior trunk lean followed by more upright posture once in standing Requires heavy min A to come to stand Repeated cuing to push up with L hand from mat and not pull up on walker with her hands X10reps  Standing for 64min10sec, with RW support, during UE reaching task to external targets on verbal command of therapist to promote upright posture Pt continues to demo bilateral hip abductor weakness with alternating "hip drop" as her LEs fatigue Also resulting in progressively increased knee flexion in standing  Standing with B UE support on RW performing alternating foot taps to 4" white step  2x 10 reps per LE Improved upright posture but with fatigue has progressive worsening hip/knee flexion and forward trunk flexed posture More instability standing on L LE to lift R LE to step with noticeable R hip flexor weakness as well Decreased R foot clearance   Gait training 73ft using RW with skilled progressively heavier min assist for balance and AD management. Pt demonstrating the following gait deviations with therapist providing the described cuing and facilitation for improvement:  Continues to demo progressively worsening anterior trunk flexed posture Poor R LE foot clearance compared to L Due to impaired L hip stability during L stance & poor R hip flexor strength to pick-up and advance R LE More consistent reciprocal stepping pattern +2 transport chair follow for safety and to allow increased distance   Educated pt and caregiver on pt's limited number of PT visits  approved during current insurance authorization despite her need for continued skilled physical therapy. Therapist educated them on local [facility] physical therapy clinic run by [facility] DPT program. Therapist also educated on option of signing pt up for [facility] at [facility] hospital or [employer] at [employer] to at least allow pt to participate in safe seated exercises such as using the Nustep recumbent bicycle.  ***   PATIENT EDUCATION: Education details: Findings on assessments today, comparison of pt's CLOF compared to last seen by this therapist, therapy POC and LTGs, education on continuing to encourage pt to stand every ~1 hour and ambulate to/from bathroom using RW and aid's assistance Person educated: Patient and Caregiver [name] Education method: Explanation Education comprehension: verbalized understanding and needs further education  HOME EXERCISE PROGRAM: Need to initiate   GOALS: Goals reviewed with patient? Yes  SHORT TERM GOALS: Target date: 04/18/2024  Patient will be independent in home exercise program to improve strength/mobility for better functional independence with ADLs.  Baseline: need to initiate Goal status: INITIAL   LONG TERM GOALS: Target date: 05/30/2024  Patient will perform bed mobility with consistently no more than min A utilizing bed features/supports as needed and with no more than than min cuing from aide/caregiver.  Baseline: requires up to mod A and mod/max verbal cuing Goal status: INITIAL  2.  Patient will increase PsFS score to equal to or greater than and average of 2 points to demonstrate statistically significant improvement in mobility and quality of life.  Baseline: 3.667 Goal status: INITIAL  3.  Patient (> 88 years old) will complete five times sit to stand test in < 30 seconds indicating an increased LE strength and improved balance.  Baseline: 83 seconds from her transport chair with RW in front of her and min progressed to  light mod A  Goal status: INITIAL   4.  Patient will reduce timed up and go to <60 sec seconds to reduce fall risk and demonstrate improved transfer/gait ability.  Baseline: 86min54 seconds using RW with min A  Goal status: INITIAL  5.  Patient will increase 10 meter walk test by 0.30m/s as to improve gait speed for better household level ambulation and to reduce fall risk.  Baseline: 0.010m/s with RW  Goal status: INITIAL   ASSESSMENT:  CLINICAL IMPRESSION: *** Patient is a 89 y.o. female who was seen today for physical therapy treatment for generalized weakness with significant B LE weakness, impaired balance, impaired endurance, impaired motor planning, impaired bed mobility, transfers, and gait due to multiple comorbidities including Alzheimer's disease + vascular dementia. Patient motivated to participate in therapy session despite reporting fatigue. Participated in repeated sit<>stand exercise to promote increased anterior trunk lean/weight shift to increase independence with coming to stand and strength B LEs. Participated in standing alternating foot tap exercise for functional B LE strengthening to improve gait with pt demonstrating improved foot clearance to tap step. Also participated in gait training reaching 27ft! With pt having more consistent reciprocal stepping pattern and slight improvement in hip/knee extension for upright posture.  Ms. Savary will benefit from further skilled PT to improve these deficits in order to increase QOL and ease/safety with ADLs.   OBJECTIVE IMPAIRMENTS: Abnormal gait, decreased activity tolerance, decreased balance, decreased cognition, decreased coordination, decreased endurance, decreased knowledge of condition, decreased knowledge of use of DME, decreased mobility, difficulty walking, decreased ROM, decreased strength, impaired flexibility, impaired sensation, impaired UE functional use, and pain.   ACTIVITY LIMITATIONS: carrying, lifting, sitting,  standing, transfers, bed mobility, continence, bathing, toileting, dressing, self feeding, reach over head, hygiene/grooming, and locomotion level  PARTICIPATION LIMITATIONS: meal prep, cleaning, laundry, shopping, and community activity  PERSONAL FACTORS: Age, Time since onset of injury/illness/exacerbation, and 3+ comorbidities: hypothyroidism, recurrent UTIs, osteoarthritis, prior anterior trochanter fracture, CKD stage IIIb, chronic systolic heart failure, dementia, hypertension, history of cardiac pacemaker, history of normal pressure hydrocephalus, overreactive bladder are also affecting patient's functional outcome.   REHAB POTENTIAL: Good  CLINICAL DECISION MAKING: Evolving/moderate complexity  EVALUATION COMPLEXITY: Moderate  PLAN:  PT FREQUENCY: 1-2x/week  PT DURATION: 12 weeks  PLANNED INTERVENTIONS: 97164- PT Re-evaluation, 97750- Physical Performance Testing, 97110-Therapeutic exercises, 97530- Therapeutic activity, 97112- Neuromuscular re-education, 97535- Self Care, [id number]- Manual therapy, [phone]- Gait training, [phone]- Canalith repositioning, Patient/Family education, Balance training, Stair training, Joint mobilization, Vestibular training, Visual/preceptual remediation/compensation, Cognitive remediation, DME instructions, Cryotherapy, Moist heat, and Biofeedback  PLAN FOR NEXT SESSIONS:  *** - may be end of insurance auth  - plan for potential D/C - show [employer]  - show Nustep - ensure HEP up to date   - hip flexor stretch - supine bridges - sit<>stands - standing reaching overhead/upright for improved hip extension and upright posture - gait training - standing marches/hip flexion for improved foot clearance     Zaid Tomes, PT, DPT, NCS, CSRS Physical Therapist - [place]  [facility]  10:04 AM 03/19/24

## 2024-03-21 ENCOUNTER — Encounter

## 2024-03-21 ENCOUNTER — Ambulatory Visit: Admitting: Physical Therapy

## 2024-03-21 DIAGNOSIS — R2681 Unsteadiness on feet: Secondary | ICD-10-CM

## 2024-03-21 DIAGNOSIS — R262 Difficulty in walking, not elsewhere classified: Secondary | ICD-10-CM

## 2024-03-21 DIAGNOSIS — R269 Unspecified abnormalities of gait and mobility: Secondary | ICD-10-CM

## 2024-03-21 DIAGNOSIS — R278 Other lack of coordination: Secondary | ICD-10-CM

## 2024-03-21 DIAGNOSIS — M6281 Muscle weakness (generalized): Secondary | ICD-10-CM

## 2024-03-21 DIAGNOSIS — R5381 Other malaise: Secondary | ICD-10-CM

## 2024-03-21 NOTE — Therapy (Signed)
 OUTPATIENT PHYSICAL THERAPY NEURO TREATMENT   Patient Name: Kerri [name] MRN: [id number] DOB:[birth year], 89 y.o., female Today's Date: 03/21/2024   PCP: Lenell Query, PA-C  REFERRING PROVIDER: Lenell Query, PA-C   END OF SESSION:   PT End of Session - 03/21/24 1405     Visit Number 4    Number of Visits 24    Date for PT Re-Evaluation 05/30/24    Authorization Time Period [employer] [id number] for 4 PT vistis from 5/28-6/25 (6/4 is 3 of 4)    PT Start Time 1405    PT Stop Time 1450    PT Time Calculation (min) 45 min    Equipment Utilized During Treatment Gait belt    Activity Tolerance Patient tolerated treatment well;No increased pain    Behavior During Therapy WFL for tasks assessed/performed               Past Medical History:  Diagnosis Date   Arthritis    Chronic systolic heart failure (HCC)    CKD (chronic kidney disease), stage III (HCC)    Dementia (HCC)    Hypertension    Hypothyroidism    Overactive bladder    Past Surgical History:  Procedure Laterality Date   INTRAMEDULLARY (IM) NAIL INTERTROCHANTERIC Left 09/06/2022   Procedure: INTRAMEDULLARY (IM) NAIL INTERTROCHANTERIC;  Surgeon: Lorri Rota, MD;  Location: [facility] OR[facility];  Service: Orthopedics;  Laterality: Left;   PACEMAKER LEADLESS INSERTION N/A 01/07/2021   Procedure: PACEMAKER LEADLESS INSERTION;  Surgeon: Percival Brace, MD;  Location: [facility];  Service: Cardiovascular;  Laterality: N/A;   PPM GENERATOR REMOVAL N/A 01/07/2021   Procedure: PPM GENERATOR REMOVAL;  Surgeon: Percival Brace, MD;  Location: [facility];  Service: Cardiovascular;  Laterality: N/A;   Patient Active Problem List   Diagnosis Date Noted   AKI (acute kidney injury) (HCC) 01/12/2024   Acute postoperative anemia due to expected blood loss 09/07/2022   Displaced intertrochanteric fracture of left femur, initial encounter for closed fracture (HCC) 09/05/2022   Orthostatic  hypotension 07/12/2022   Diarrhea 07/10/2022   COVID-19 virus infection 07/08/2022   Electrolyte abnormality 07/08/2022   Malnutrition of moderate degree 07/08/2022   Acute delirium 07/08/2022   History of recurrent UTI (urinary tract infection) 01/30/2022   Constipation    UTI (urinary tract infection) 10/15/2021   Elevated brain natriuretic peptide (BNP) level 10/15/2021   Generalized weakness 10/15/2021   Chronic kidney disease, stage 3b (HCC) 10/15/2021   Normal pressure hydrocephalus (HCC) 10/15/2021   Dementia without behavioral disturbance (HCC) 10/15/2021   Hypothyroidism 10/15/2021   Essential hypertension 10/15/2021   Overactive bladder 10/15/2021   Moderate mitral regurgitation 01/13/2021   Status post placement of cardiac pacemaker 01/13/2021   CHB (complete heart block) (HCC) 01/07/2021   Chronic systolic CHF (congestive heart failure) (HCC) 12/17/2020   Postmenopausal osteoporosis 08/26/2016   Mixed Alzheimer's and vascular dementia (HCC) 06/13/2014    ONSET DATE: Progressive decline over ~1-1.5 years  REFERRING DIAG:  M51.369 (ICD-10-CM) - Other intervertebral disc degeneration, lumbar region without mention of lumbar back pain or lower extremity pain  R53.81 (ICD-10-CM) - Other malaise  M62.81 (ICD-10-CM) - Muscle weakness (generalized)    THERAPY DIAG:  Muscle weakness (generalized)  Other lack of coordination  Debility  Difficulty in walking, not elsewhere classified  Abnormality of gait and mobility  Unsteadiness on feet  Rationale for Evaluation and Treatment: Rehabilitation  SUBJECTIVE:  SUBJECTIVE STATEMENT:  Pt recently had appointment with Internal Med MD due to recent upper respiratory infection, Caregiver reports pt was negative for COVID and the flu,  assumed pt had standard cold and pt reporting feeling good today.  Caregiver states MD put in another referral for PT to hopefully extend her POC as pt would benefit form continued skilled therapy. Caregiver reports MD referred pt to trial aquatic therapy - therapist educated on safety with need for indoor pool with the lift chair to safely get pt in/out of the water and patient requiring assistance in the pool.  No reports of pain. Denies stumbles/falls.   From Eval: Patient previously known to this clinic. Patient with recent short hospitalization following AKI superimposed on CKI stage III with UTI. Pt received HHPT prior to returning to OPPT.  [name], caregiver, reports having taken patient to MD due to pt having decreased interest in doing things and then MD found out pt's kidney levels were low, resulting in her hospitalization.   Denies pain. Denies falls/stumbles at home.  Pt accompanied by: caregiver, [name]   PERTINENT HISTORY:  PMH: hypothyroidism, recurrent UTIs, osteoarthritis, prior anterior trochanter fracture, CKD stage IIIb, chronic systolic heart failure, dementia, hypertension, history of cardiac pacemaker, history of normal pressure hydrocephalus, overreactive bladder   Most recent A&P from Neurology note on 12/21/2023: Mixed dementia (Alzheimer's disease + vascular dementia) with significant ventriculomegaly (with negative lumbar drain trial in 2015) in a patient with history of sepsis secondary to UTI treated with trimethoprim  January 2023. Medication appears to help with UTI and improved mental status - reports good control with current medication Progressive behavioral disturbances with mood swings, outbursts, and apathy, exacerbated during the day. Seroquel (quetiapine) was initiated but caused significant lethargy. Alternative medications, lamotrigine and Depakote, were discussed for her mood-stabilizing properties and different tolerability profiles. - Discuss with primary  care provider about reducing Seroquel dose to assess tolerability. Consider 1/2 tablet (12.5 mg) instead.  - If unable to tolerate Seroquel at lower dose, will consider trial of Depakote Valproic Acid is an antiepileptic drug, used for many different conditions. Patient was advised not to stop medication abruptly, not to use alcohol. Acute side-effects can be abdominal pain, N/V, diarrhea and increased ammonia level. Long term side-effects are hair loss, weight gain, tremors and amenorrhea. Rarely, it can cause low platelets, liver failure and birth defects.  - Consider alternative medications such as lamotrigine or Depakote if Seroquel remains intolerable. - Document alternative medications in the chart for future reference.  Fatigue and somnolence Increased fatigue and somnolence potentially related to Seroquel, hypothyroidism, and medication side effects.  - Monitor heart rate and adjust medications if fatigue persists and heart rate remains low.  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 Symptoms include leg pain, particularly in the right leg, and dragging of the leg during ambulation. Physical therapy has been beneficial. Her symptoms arte chronic. She has had a reportedly negative lower extremity doppler, unavailable for my review.  Right leg pain with inability to pick up feet (right>left - likely from ventriculomegaly + peripheral neuropathy) patient with history of lumbar disc disease with stenosis - reports improvement during recent trial of prednisone. Suspicious for lumbar Disc Disease in addition to decondition Reviewed PCP note: Ongoing leg pain with intermittent weakness. On exam she does have some swelling around the right knee. Unsure if she could have some severe arthritis catching as she walks. Will obtain x-ray of the  right knee today. Trial of prednisone 20 mg once a day for  5 days. We did discuss that she has some degenerative lumbar disc disease with stenosis and likely playing a role in right leg weakness. She has a pacemaker and we will defer MRI of the lumbar spine. Keep follow-up with neurology and if they feel it is necessary they could possibly pursue nerve conduction study. Inform caretaker to help patient and stay by her side when walking.   - Patient says that her legs feel heavy and that she has difficulty initiating the first step when walking - consistent with magnetic gait in patient with hydrocephalus and brain atrophy - Previous (-) Lumbar Puncture trial at [facility] (01/09/14). - Continue physical therapy to manage symptoms and improve mobility. - strict Emergency Room/return precautions    PAIN:  Are you having pain? No  PRECAUTIONS: Fall and ICD/Pacemaker  RED FLAGS: None and hx of spinal stenosis   WEIGHT BEARING RESTRICTIONS: No  FALLS: Has patient fallen in last 6 months? No  LIVING ENVIRONMENT: Lives with: caregiver 7 days a week. Alone for an hour or 2 a day, but only when alseep.   Lives in: House/apartment Stairs: Yes: Internal: 1 but ramp in place steps; none and External: 1 steps; none Has following equipment at home: Walker - 2 wheeled; transport chair; shower chair and grab bars   PLOF: Needs assistance with ADLs, Needs assistance with gait, Needs assistance with transfers, and Needs assistance with ambulation using RW - Primarily uses transport chair for mobility, but will ambulate with assistance to/from bathroom using RW in the home  PATIENT GOALS: Help her walking be easier and help her get stronger   OBJECTIVE:  Note: Objective measures were completed at Evaluation unless otherwise noted.  DIAGNOSTIC FINDINGS:   EXAM (from 10/28/2022): CT LUMBAR SPINE WITHOUT CONTRAST IMPRESSION: 1. Transitional anatomy. Adhering to convention from the prior lumbar spine MRI, the last fully formed disc space is labeled L5-S1. 2.  Unchanged chronic compression fractures of L4 and L5. No new fracture of the lumbar spine. 3. Unchanged lumbar spondylosis notable for moderate narrowing of the right lateral recess at L1-2 and L2-3, as well as mild spinal canal stenosis at L2-3 and L3-4.  Electronically Signed   By: Audra Blend M.D.   On: 10/28/2022 16:42   EXAM (from 09/09/2022): CT HEAD WITHOUT CONTRAST  COMPARISON: CT head 09/05/2022, 08/15/2021   IMPRESSION: 1. No acute abnormality and no change from prior studies. 2. Atrophy and chronic microvascular ischemic change in the white matter.   Electronically Signed   By: Anastasio Balsam M.D.   On: 09/09/2022 11:50    COGNITION: Overall cognitive status: History of cognitive impairments - at baseline   SENSATION: Light touch: WFL on screen, but pt with hx of neuropathy in B LEs  COORDINATION: Not formally assessed, likely impacted by strength deficits as noted below  EDEMA:  Not formally assessed, but no significant edema noted  MUSCLE TONE: Not formally assessed   MUSCLE LENGTH: Not formally assessed   DTRs:  Not formally assessed   POSTURE: rounded shoulders, forward head, increased thoracic kyphosis, posterior pelvic tilt, and flexed trunk    LOWER EXTREMITY ROM:     Active  Right Eval Left Eval  Hip flexion Limited AROM by weakness Limited AROM by weakness (more impaired than R)  Hip extension Decreased ROM Decreased ROM  Hip abduction    Hip adduction    Hip internal rotation    Hip  external rotation    Knee flexion [facility] WFL  Knee extension [facility]  Ankle dorsiflexion [facility]  Ankle plantarflexion [facility] WFL  Ankle inversion    Ankle eversion     (Blank rows = not tested)  LOWER EXTREMITY MMT:    MMT Right Eval Left Eval  Hip flexion 3+ 3-  Hip extension    Hip abduction    Hip adduction    Hip internal rotation    Hip external rotation    Knee flexion 4- 4-  Knee extension 4- 4-  Ankle dorsiflexion 4- 3-  Ankle  plantarflexion 3+ 4-  Ankle inversion    Ankle eversion    (Blank rows = not tested)  Manual Muscle Test Scale 0/5 = No muscle contraction can be seen or felt 1/5 = Contraction can be felt, but there is no motion 2-/5 = Part moves through incomplete ROM w/ gravity decreased 2/5 = Part moves through complete ROM w/ gravity decreased 2+/5 = Part moves through incomplete ROM (<50%) against gravity or through complete ROM w/ gravity 3-/5 = Part moves through incomplete ROM (>50%) against gravity 3/5 = Part moves through complete ROM against gravity 3+/5 = Part moves through complete ROM against gravity/slight resistance 4-/5= Holds test position against slight to moderate pressure 4/5 = Part moves through complete ROM against gravity/moderate resistance 4+/5= Holds test position against moderate to strong pressure 5/5 = Part moves through complete ROM against gravity/full resistance  BED MOBILITY:  Findings: Sit to supine Min A for B LE management onto mat Supine to sit Min A for trunk upright with continued impaired motor plan Pt doesn't recall previously trained logroll technique to increase her independence, could be due to impaired carryover of technique in the clinic space Caregiver reports bed mobility has improved slightly at home  Pt with significant difficulty performing anterior scooting towards EOM requiring mod A for this   TRANSFERS: Sit to stand: Min A  Assistive device utilized: Environmental consultant - 2 wheeled     Stand to sit: Min A  Assistive device utilized: Environmental consultant - 2 wheeled     Chair to chair: Min A  Assistive device utilized: Environmental consultant - 2 wheeled      *continues to require cuing to turn fully prior to initiating sitting   RAMP:  Not tested  CURB:  Not tested  STAIRS: Not tested GAIT: Findings: Gait Characteristics: step to pattern, step through pattern, decreased step length- Right, decreased step length- Left, decreased stride length, decreased hip/knee flexion- Right,  decreased hip/knee flexion- Left, decreased ankle dorsiflexion- Right, decreased ankle dorsiflexion- Left, trunk flexed, narrow BOS, poor foot clearance- Right, and poor foot clearance- Left, Distance walked: ~32ft x2, Assistive device utilized:Walker - 2 wheeled, Level of assistance: Min A, and Comments: severely decreased gait speed  FUNCTIONAL TESTS:  5 times sit to stand: 83 seconds Timed up and go (TUG):  107min54 seconds using RW with min A 10 meter walk test: needs to be assessed  PATIENT SURVEYS:  PsFS: average: 3.67  Patient will completely turn 90degrees prior to sitting down during transfers with minimal cuing: 3  Patient will be able to maintain standing balance using B UE support on RW with only SBA while aide provides total A for LB clothing or ADLs: 5 Patient will stand with erect/upright posture when walking: 3  TREATMENT DATE: 03/21/2024   Pt arrives in her personal transport chair.  Therapist educates on option to use Nustep recumbent bike for B LE functional strengthening and cardiovascular training.   Stand pivot transport chair<>Nustep using RW with min A for lifting to stand and min A for balance with cuing for turning fully via stepping to get hips to face seat prior to initiating sitting.  Pt with impaired ability to lift R LE and clear it from floor when stepping sideways during transfer Requires cuing for improved AD management when turning  Pt improving in her consistency with this  B UE and B LE reciprocal movement pattern on Nustep for cardiovascular training and B LE functional strengthening against level 1 resistance for 5 minutes totaling 198 steps Performed final minute with just B LEs to focus on strengthening Educated caregiver on how to set-up machine for pt to use at [facility]   Therapist reviewed HEP as prescribed below with  patient's caregiver. Caregiver reports feeling comfortable with assisting patient in performing these exercises.   Stand pivot transfer to EOM as described above - potentially improved transfers towards R compared to L.   Sit<>supine on mat with min A for trunk descent and  BLE management with cuing to reinforce logroll technique to increase pt's independence with the transition.   Supine bridges x10 reps with cuing for improved glute and hamstring activation with pt reporting this as very challenging.  Therapist introduced patient and Caregiver to the [facility] as an option to allow patient to perform exercise using Nustep.    PATIENT EDUCATION: Education details: Findings on assessments today, comparison of pt's CLOF compared to last seen by this therapist, therapy POC and LTGs, education on continuing to encourage pt to stand every ~1 hour and ambulate to/from bathroom using RW and aid's assistance Person educated: Patient and Caregiver [name] Education method: Explanation Education comprehension: verbalized understanding and needs further education  HOME EXERCISE PROGRAM:  Access Code: [email or web address] URL: [email or web address] Date: 12/20/2023 Prepared by: Carlen Chasten   Exercises - Sit to Stand with Armchair  - 1 x daily - 7 x weekly - 3 sets - 5 reps - Standing March with Counter Support  - 1 x daily - 7 x weekly - 2 sets - 10 reps - Supine Bridge  - 1 x daily - 7 x weekly - 2 sets - 10 reps - Supine Heel Slide with Strap  - 1 x daily - 7 x weekly - 2 sets - 10 reps - Supine Hip Abduction AROM  - 1 x daily - 7 x weekly - 3 sets - 10 reps - Seated Long Arc Quad  - 1 x daily - 7 x weekly - 3 sets - 10 reps - Seated March  - 1 x daily - 7 x weekly - 3 sets - 10 reps - 2 hold - Seated Hip Abduction with Resistance  - 1 x daily - 7 x weekly - 2 sets - 10 reps   GOALS: Goals reviewed with patient? Yes  SHORT TERM GOALS: Target date: 04/18/2024  Patient will be independent in  home exercise program to improve strength/mobility for better functional independence with ADLs.  Baseline: need to initiate Goal status: INITIAL   LONG TERM GOALS: Target date: 05/30/2024  Patient will perform bed mobility with consistently no more than min A utilizing bed features/supports as needed and with no more than than min cuing from aide/caregiver.  Baseline: requires up to mod A and mod/max verbal cuing  Goal status: INITIAL  2.  Patient will increase PsFS score to equal to or greater than and average of 2 points to demonstrate statistically significant improvement in mobility and quality of life.  Baseline: 3.667 Goal status: INITIAL  3.  Patient (> 53 years old) will complete five times sit to stand test in < 30 seconds indicating an increased LE strength and improved balance.  Baseline: 83 seconds from her transport chair with RW in front of her and min progressed to light mod A  Goal status: INITIAL   4.  Patient will reduce timed up and go to <60 sec seconds to reduce fall risk and demonstrate improved transfer/gait ability.  Baseline: 35min54 seconds using RW with min A  Goal status: INITIAL  5.  Patient will increase 10 meter walk test by 0.13m/s as to improve gait speed for better household level ambulation and to reduce fall risk.  Baseline: 0.028m/s with RW  Goal status: INITIAL   ASSESSMENT:  CLINICAL IMPRESSION:  Patient is a 89 y.o. female who was seen today for physical therapy treatment for generalized weakness with significant B LE weakness, impaired balance, impaired endurance, impaired motor planning, impaired bed mobility, transfers, and gait due to multiple comorbidities including Alzheimer's disease + vascular dementia. Patient motivated to participate in therapy session to get better. Therapy session focused on patient and caregiver education to promote patient maintaining improvements in therapy of increased endurance and B LE strength for improved  functional mobility. Patient continues to demonstrate improvements in her sit<>stands, stand pivot transfers, and gait abilities with continued skilled physical therapy resulting in decreased fall risk and reduced caregiver burden. Ms. Pianka will benefit from further skilled PT to improve these deficits in order to increase QOL and ease/safety with ADLs.   OBJECTIVE IMPAIRMENTS: Abnormal gait, decreased activity tolerance, decreased balance, decreased cognition, decreased coordination, decreased endurance, decreased knowledge of condition, decreased knowledge of use of DME, decreased mobility, difficulty walking, decreased ROM, decreased strength, impaired flexibility, impaired sensation, impaired UE functional use, and pain.   ACTIVITY LIMITATIONS: carrying, lifting, sitting, standing, transfers, bed mobility, continence, bathing, toileting, dressing, self feeding, reach over head, hygiene/grooming, and locomotion level  PARTICIPATION LIMITATIONS: meal prep, cleaning, laundry, shopping, and community activity  PERSONAL FACTORS: Age, Time since onset of injury/illness/exacerbation, and 3+ comorbidities: hypothyroidism, recurrent UTIs, osteoarthritis, prior anterior trochanter fracture, CKD stage IIIb, chronic systolic heart failure, dementia, hypertension, history of cardiac pacemaker, history of normal pressure hydrocephalus, overreactive bladder are also affecting patient's functional outcome.   REHAB POTENTIAL: Good  CLINICAL DECISION MAKING: Evolving/moderate complexity  EVALUATION COMPLEXITY: Moderate  PLAN:  PT FREQUENCY: 1-2x/week  PT DURATION: 12 weeks  PLANNED INTERVENTIONS: 97164- PT Re-evaluation, 97750- Physical Performance Testing, 97110-Therapeutic exercises, 97530- Therapeutic activity, 97112- Neuromuscular re-education, 97535- Self Care, 60454- Manual therapy, [phone]- Gait training, [phone]- Canalith repositioning, Patient/Family education, Balance training, Stair training,  Joint mobilization, Vestibular training, Visual/preceptual remediation/compensation, Cognitive remediation, DME instructions, Cryotherapy, Moist heat, and Biofeedback  PLAN FOR NEXT SESSIONS:   - hip flexor stretch - supine bridges - sit<>stands - standing reaching overhead/upright for improved hip extension and upright posture - gait training - standing marches/hip flexion for improved foot clearance     Alexxia Stankiewicz, PT, DPT, NCS, CSRS Physical Therapist - [employer]  [facility]  2:52 PM 03/21/24

## 2024-03-26 ENCOUNTER — Ambulatory Visit: Admitting: Physical Therapy

## 2024-03-26 ENCOUNTER — Ambulatory Visit

## 2024-03-27 NOTE — Therapy (Incomplete)
 OUTPATIENT PHYSICAL THERAPY NEURO TREATMENT   Patient Name: Kerri [name] MRN: [id number] DOB:1931/08/17, 89 y.o., female Today's Date: 03/27/2024   PCP: Cyrus Selinda Moose, PA-C  REFERRING PROVIDER: Cyrus Selinda Moose, PA-C   END OF SESSION: ***      Past Medical History:  Diagnosis Date   Arthritis    Chronic systolic heart failure (HCC)    CKD (chronic kidney disease), stage III (HCC)    Dementia (HCC)    Hypertension    Hypothyroidism    Overactive bladder    Past Surgical History:  Procedure Laterality Date   INTRAMEDULLARY (IM) NAIL INTERTROCHANTERIC Left 09/06/2022   Procedure: INTRAMEDULLARY (IM) NAIL INTERTROCHANTERIC;  Surgeon: Tobie Priest, MD;  Location: [facility] ORS;  Service: Orthopedics;  Laterality: Left;   PACEMAKER LEADLESS INSERTION N/A 01/07/2021   Procedure: PACEMAKER LEADLESS INSERTION;  Surgeon: Ammon Blunt, MD;  Location: [facility] IN[facility]IVE [facility];  Service: Cardiovascular;  Laterality: N/A;   PPM GENERATOR REMOVAL N/A 01/07/2021   Procedure: PPM GENERATOR REMOVAL;  Surgeon: Ammon Blunt, MD;  Location: [facility] IN[facility]IVE CV [facility];  Service: Cardiovascular;  Laterality: N/A;   Patient Active Problem List   Diagnosis Date Noted   AKI (acute kidney injury) (HCC) 01/12/2024   Acute postoperative anemia due to expected blood loss 09/07/2022   Displaced intertrochanteric fracture of left femur, initial encounter for closed fracture (HCC) 09/05/2022   Orthostatic hypotension 07/12/2022   Diarrhea 07/10/2022   COVID-19 virus infection 07/08/2022   Electrolyte abnormality 07/08/2022   Malnutrition of moderate degree 07/08/2022   Acute delirium 07/08/2022   History of recurrent UTI (urinary tract infection) 01/30/2022   Constipation    UTI (urinary tract infection) 10/15/2021   Elevated brain natriuretic peptide (BNP) level 10/15/2021   Generalized weakness 10/15/2021   Chronic kidney disease, stage 3b (HCC) 10/15/2021   Normal pressure  hydrocephalus (HCC) 10/15/2021   Dementia without behavioral disturbance (HCC) 10/15/2021   Hypothyroidism 10/15/2021   Essential hypertension 10/15/2021   Overactive bladder 10/15/2021   Moderate mitral regurgitation 01/13/2021   Status post placement of cardiac pacemaker 01/13/2021   CHB (complete heart block) (HCC) 01/07/2021   Chronic systolic CHF (congestive heart failure) (HCC) 12/17/2020   Postmenopausal osteoporosis 08/26/2016   Mixed Alzheimer's and vascular dementia (HCC) 06/13/2014    ONSET DATE: Progressive decline over ~1-1.5 years  REFERRING DIAG:  M51.369 (ICD-10-CM) - Other intervertebral disc degeneration, lumbar region without mention of lumbar back pain or lower extremity pain  R53.81 (ICD-10-CM) - Other malaise  M62.81 (ICD-10-CM) - Muscle weakness (generalized)    THERAPY DIAG: *** No diagnosis found.  Rationale for Evaluation and Treatment: Rehabilitation  SUBJECTIVE:  SUBJECTIVE STATEMENT:  ***LOOK AT SCHEDULE TOGETHER - only 3 visits left doing 1x/week*** Thoughts on [facility]? Use of Nustep?  Pt recently had appointment with Internal Med MD due to recent upper respiratory infection, Caregiver reports pt was negative for COVID and the flu, assumed pt had standard cold and pt reporting feeling good today.  Caregiver states MD put in another referral for PT to hopefully extend her POC as pt would benefit form continued skilled therapy. Caregiver reports MD referred pt to trial aquatic therapy - therapist educated on safety with need for indoor pool with the lift chair to safely get pt in/out of the water and patient requiring assistance in the pool.  No reports of pain. Denies stumbles/falls.  ***    From Eval: Patient previously known to this clinic. Patient with  recent short hospitalization following AKI superimposed on CKI stage III with UTI. Pt received HHPT prior to returning to OPPT.  [name], caregiver, reports having taken patient to MD due to pt having decreased interest in doing things and then MD found out pt's kidney levels were low, resulting in her hospitalization.   Denies pain. Denies falls/stumbles at home.  Pt accompanied by: caregiver, [name]   PERTINENT HISTORY:  PMH: hypothyroidism, recurrent UTIs, osteoarthritis, prior anterior trochanter fracture, CKD stage IIIb, chronic systolic heart failure, dementia, hypertension, history of cardiac pacemaker, history of normal pressure hydrocephalus, overreactive bladder   Most recent A&P from Neurology note on 12/21/2023: Mixed dementia (Alzheimer's disease + vascular dementia) with significant ventriculomegaly (with negative lumbar drain trial in 2015) in a patient with history of sepsis secondary to UTI treated with trimethoprim  January 2023. Medication appears to help with UTI and improved mental status - reports good control with current medication Progressive behavioral disturbances with mood swings, outbursts, and apathy, exacerbated during the day. Seroquel (quetiapine) was initiated but caused significant lethargy. Alternative medications, lamotrigine and Depakote, were discussed for her mood-stabilizing properties and different tolerability profiles. - Discuss with primary care provider about reducing Seroquel dose to assess tolerability. Consider 1/2 tablet (12.5 mg) instead.  - If unable to tolerate Seroquel at lower dose, will consider trial of Depakote Valproic Acid is an antiepileptic drug, used for many different conditions. Patient was advised not to stop medication abruptly, not to use alcohol. Acute side-effects can be abdominal pain, N/V, diarrhea and increased ammonia level. Long term side-effects are hair loss, weight gain, tremors and amenorrhea. Rarely, it can cause low platelets,  liver failure and birth defects.  - Consider alternative medications such as lamotrigine or Depakote if Seroquel remains intolerable. - Document alternative medications in the chart for future reference.  Fatigue and somnolence Increased fatigue and somnolence potentially related to Seroquel, hypothyroidism, and medication side effects.  - Monitor heart rate and adjust medications if fatigue persists and heart rate remains low.  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 Symptoms include leg pain, particularly in the right leg, and dragging of the leg during ambulation. Physical therapy has been beneficial. Her symptoms arte chronic. She has had a reportedly negative lower extremity doppler, unavailable for my review.  Right leg pain with inability to pick up feet (right>left - likely from ventriculomegaly + peripheral neuropathy) patient with history of lumbar disc disease with stenosis - reports improvement during recent trial of prednisone. Suspicious for lumbar Disc Disease in addition to decondition Reviewed PCP note: Ongoing leg pain with intermittent weakness. On exam she does have some swelling  around the right knee. Unsure if she could have some severe arthritis catching as she walks. Will obtain x-ray of the right knee today. Trial of prednisone 20 mg once a day for 5 days. We did discuss that she has some degenerative lumbar disc disease with stenosis and likely playing a role in right leg weakness. She has a pacemaker and we will defer MRI of the lumbar spine. Keep follow-up with neurology and if they feel it is necessary they could possibly pursue nerve conduction study. Inform caretaker to help patient and stay by her side when walking.   - Patient says that her legs feel heavy and that she has difficulty initiating the first step when walking - consistent with magnetic gait in  patient with hydrocephalus and brain atrophy - Previous (-) Lumbar Puncture trial at [facility] (01/09/14). - Continue physical therapy to manage symptoms and improve mobility. - strict Emergency Room/return precautions    PAIN:  Are you having pain? No  PRECAUTIONS: Fall and ICD/Pacemaker  RED FLAGS: None and hx of spinal stenosis   WEIGHT BEARING RESTRICTIONS: No  FALLS: Has patient fallen in last 6 months? No  LIVING ENVIRONMENT: Lives with: caregiver 7 days a week. Alone for an hour or 2 a day, but only when alseep.   Lives in: House/apartment Stairs: Yes: Internal: 1 but ramp in place steps; none and External: 1 steps; none Has following equipment at home: Walker - 2 wheeled; transport chair; shower chair and grab bars   PLOF: Needs assistance with ADLs, Needs assistance with gait, Needs assistance with transfers, and Needs assistance with ambulation using RW - Primarily uses transport chair for mobility, but will ambulate with assistance to/from bathroom using RW in the home  PATIENT GOALS: Help her walking be easier and help her get stronger   OBJECTIVE:  Note: Objective measures were completed at Evaluation unless otherwise noted.  DIAGNOSTIC FINDINGS:   EXAM (from 10/28/2022): CT LUMBAR SPINE WITHOUT CONTRAST IMPRESSION: 1. Transitional anatomy. Adhering to convention from the prior lumbar spine MRI, the last fully formed disc space is labeled L5-S1. 2. Unchanged chronic compression fractures of L4 and L5. No new fracture of the lumbar spine. 3. Unchanged lumbar spondylosis notable for moderate narrowing of the right lateral recess at L1-2 and L2-3, as well as mild spinal canal stenosis at L2-3 and L3-4.  Electronically Signed   By: Ryan Chess M.D.   On: 10/28/2022 16:42   EXAM (from 09/09/2022): CT HEAD WITHOUT CONTRAST  COMPARISON: CT head 09/05/2022, 08/15/2021   IMPRESSION: 1. No acute abnormality and no change from prior studies. 2. Atrophy and  chronic microvascular ischemic change in the white matter.   Electronically Signed   By: Carlin Gaskins M.D.   On: 09/09/2022 11:50    COGNITION: Overall cognitive status: History of cognitive impairments - at baseline   SENSATION: Light touch: WFL on screen, but pt with hx of neuropathy in B LEs  COORDINATION: Not formally assessed, likely impacted by strength deficits as noted below  EDEMA:  Not formally assessed, but no significant edema noted  MUSCLE TONE: Not formally assessed   MUSCLE LENGTH: Not formally assessed   DTRs:  Not formally assessed   POSTURE: rounded shoulders, forward head, increased thoracic kyphosis, posterior pelvic tilt, and flexed trunk    LOWER EXTREMITY ROM:     Active  Right Eval Left Eval  Hip flexion Limited AROM by weakness Limited AROM by weakness (more impaired than R)  Hip extension Decreased  ROM Decreased ROM  Hip abduction    Hip adduction    Hip internal rotation    Hip external rotation    Knee flexion [facility] WFL  Knee extension [facility]  Ankle dorsiflexion [facility]  Ankle plantarflexion [facility] WFL  Ankle inversion    Ankle eversion     (Blank rows = not tested)  LOWER EXTREMITY MMT:    MMT Right Eval Left Eval  Hip flexion 3+ 3-  Hip extension    Hip abduction    Hip adduction    Hip internal rotation    Hip external rotation    Knee flexion 4- 4-  Knee extension 4- 4-  Ankle dorsiflexion 4- 3-  Ankle plantarflexion 3+ 4-  Ankle inversion    Ankle eversion    (Blank rows = not tested)  Manual Muscle Test Scale 0/5 = No muscle contraction can be seen or felt 1/5 = Contraction can be felt, but there is no motion 2-/5 = Part moves through incomplete ROM w/ gravity decreased 2/5 = Part moves through complete ROM w/ gravity decreased 2+/5 = Part moves through incomplete ROM (<50%) against gravity or through complete ROM w/ gravity 3-/5 = Part moves through incomplete ROM (>50%) against gravity 3/5 = Part moves  through complete ROM against gravity 3+/5 = Part moves through complete ROM against gravity/slight resistance 4-/5= Holds test position against slight to moderate pressure 4/5 = Part moves through complete ROM against gravity/moderate resistance 4+/5= Holds test position against moderate to strong pressure 5/5 = Part moves through complete ROM against gravity/full resistance  BED MOBILITY:  Findings: Sit to supine Min A for B LE management onto mat Supine to sit Min A for trunk upright with continued impaired motor plan Pt doesn't recall previously trained logroll technique to increase her independence, could be due to impaired carryover of technique in the clinic space Caregiver reports bed mobility has improved slightly at home  Pt with significant difficulty performing anterior scooting towards EOM requiring mod A for this   TRANSFERS: Sit to stand: Min A  Assistive device utilized: Environmental consultant - 2 wheeled     Stand to sit: Min A  Assistive device utilized: Environmental consultant - 2 wheeled     Chair to chair: Min A  Assistive device utilized: Environmental consultant - 2 wheeled      *continues to require cuing to turn fully prior to initiating sitting   RAMP:  Not tested  CURB:  Not tested  STAIRS: Not tested GAIT: Findings: Gait Characteristics: step to pattern, step through pattern, decreased step length- Right, decreased step length- Left, decreased stride length, decreased hip/knee flexion- Right, decreased hip/knee flexion- Left, decreased ankle dorsiflexion- Right, decreased ankle dorsiflexion- Left, trunk flexed, narrow BOS, poor foot clearance- Right, and poor foot clearance- Left, Distance walked: ~38ft x2, Assistive device utilized:Walker - 2 wheeled, Level of assistance: Min A, and Comments: severely decreased gait speed  FUNCTIONAL TESTS:  5 times sit to stand: 83 seconds Timed up and go (TUG):  2min54 seconds using RW with min A 10 meter walk test: needs to be assessed  PATIENT SURVEYS:  PsFS:  average: 3.67  Patient will completely turn 90degrees prior to sitting down during transfers with minimal cuing: 3  Patient will be able to maintain standing balance using B UE support on RW with only SBA while aide provides total A for LB clothing or ADLs: 5 Patient will stand with erect/upright posture when walking: 3  TREATMENT DATE: 03/27/2024   ***  Pt arrives in her personal transport chair.  - hip flexor stretch - supine bridges - sit<>stands - standing reaching overhead/upright for improved hip extension and upright posture - gait training - standing marches/hip flexion for improved foot clearance  ***  Therapist educates on option to use Nustep recumbent bike for B LE functional strengthening and cardiovascular training.   Stand pivot transport chair<>Nustep using RW with min A for lifting to stand and min A for balance with cuing for turning fully via stepping to get hips to face seat prior to initiating sitting.  Pt with impaired ability to lift R LE and clear it from floor when stepping sideways during transfer Requires cuing for improved AD management when turning  Pt improving in her consistency with this  B UE and B LE reciprocal movement pattern on Nustep for cardiovascular training and B LE functional strengthening against level 1 resistance for 5 minutes totaling 198 steps Performed final minute with just B LEs to focus on strengthening Educated caregiver on how to set-up machine for pt to use at [facility]   Therapist reviewed HEP as prescribed below with patient's caregiver. Caregiver reports feeling comfortable with assisting patient in performing these exercises.   Stand pivot transfer to EOM as described above - potentially improved transfers towards R compared to L.   Sit<>supine on mat with min A for trunk descent and  BLE management  with cuing to reinforce logroll technique to increase pt's independence with the transition.   Supine bridges x10 reps with cuing for improved glute and hamstring activation with pt reporting this as very challenging.  Therapist introduced patient and Caregiver to the [facility] as an option to allow patient to perform exercise using Nustep.   ***   PATIENT EDUCATION: Education details: Findings on assessments today, comparison of pt's CLOF compared to last seen by this therapist, therapy POC and LTGs, education on continuing to encourage pt to stand every ~1 hour and ambulate to/from bathroom using RW and aid's assistance Person educated: Patient and Caregiver [name] Education method: Explanation Education comprehension: verbalized understanding and needs further education  HOME EXERCISE PROGRAM:  Access Code: [email or web address] URL: [email or web address] Date: 12/20/2023 Prepared by: Connell Kiss   Exercises - Sit to Stand with Armchair  - 1 x daily - 7 x weekly - 3 sets - 5 reps - Standing March with Counter Support  - 1 x daily - 7 x weekly - 2 sets - 10 reps - Supine Bridge  - 1 x daily - 7 x weekly - 2 sets - 10 reps - Supine Heel Slide with Strap  - 1 x daily - 7 x weekly - 2 sets - 10 reps - Supine Hip Abduction AROM  - 1 x daily - 7 x weekly - 3 sets - 10 reps - Seated Long Arc Quad  - 1 x daily - 7 x weekly - 3 sets - 10 reps - Seated March  - 1 x daily - 7 x weekly - 3 sets - 10 reps - 2 hold - Seated Hip Abduction with Resistance  - 1 x daily - 7 x weekly - 2 sets - 10 reps   GOALS: Goals reviewed with patient? Yes  SHORT TERM GOALS: Target date: 04/18/2024  Patient will be independent in home exercise program to improve strength/mobility for better functional independence with ADLs.  Baseline: need to initiate Goal status: INITIAL   LONG TERM GOALS: Target date: 05/30/2024  Patient  will perform bed mobility with consistently no more than min A utilizing bed  features/supports as needed and with no more than than min cuing from aide/caregiver.  Baseline: requires up to mod A and mod/max verbal cuing Goal status: INITIAL  2.  Patient will increase PsFS score to equal to or greater than and average of 2 points to demonstrate statistically significant improvement in mobility and quality of life.  Baseline: 3.667 Goal status: INITIAL  3.  Patient (> 56 years old) will complete five times sit to stand test in < 30 seconds indicating an increased LE strength and improved balance.  Baseline: 83 seconds from her transport chair with RW in front of her and min progressed to light mod A  Goal status: INITIAL   4.  Patient will reduce timed up and go to <60 sec seconds to reduce fall risk and demonstrate improved transfer/gait ability.  Baseline: 32min54 seconds using RW with min A  Goal status: INITIAL  5.  Patient will increase 10 meter walk test by 0.71m/s as to improve gait speed for better household level ambulation and to reduce fall risk.  Baseline: 0.075m/s with RW  Goal status: INITIAL   ASSESSMENT:  CLINICAL IMPRESSION: *** Patient is a 89 y.o. female who was seen today for physical therapy treatment for generalized weakness with significant B LE weakness, impaired balance, impaired endurance, impaired motor planning, impaired bed mobility, transfers, and gait due to multiple comorbidities including Alzheimer's disease + vascular dementia. Patient motivated to participate in therapy session to get better. Therapy session focused on patient and caregiver education to promote patient maintaining improvements in therapy of increased endurance and B LE strength for improved functional mobility. Patient continues to demonstrate improvements in her sit<>stands, stand pivot transfers, and gait abilities with continued skilled physical therapy resulting in decreased fall risk and reduced caregiver burden. Ms. Arnett will benefit from further skilled PT to  improve these deficits in order to increase QOL and ease/safety with ADLs.   OBJECTIVE IMPAIRMENTS: Abnormal gait, decreased activity tolerance, decreased balance, decreased cognition, decreased coordination, decreased endurance, decreased knowledge of condition, decreased knowledge of use of DME, decreased mobility, difficulty walking, decreased ROM, decreased strength, impaired flexibility, impaired sensation, impaired UE functional use, and pain.   ACTIVITY LIMITATIONS: carrying, lifting, sitting, standing, transfers, bed mobility, continence, bathing, toileting, dressing, self feeding, reach over head, hygiene/grooming, and locomotion level  PARTICIPATION LIMITATIONS: meal prep, cleaning, laundry, shopping, and community activity  PERSONAL FACTORS: Age, Time since onset of injury/illness/exacerbation, and 3+ comorbidities: hypothyroidism, recurrent UTIs, osteoarthritis, prior anterior trochanter fracture, CKD stage IIIb, chronic systolic heart failure, dementia, hypertension, history of cardiac pacemaker, history of normal pressure hydrocephalus, overreactive bladder are also affecting patient's functional outcome.   REHAB POTENTIAL: Good  CLINICAL DECISION MAKING: Evolving/moderate complexity  EVALUATION COMPLEXITY: Moderate  PLAN:  PT FREQUENCY: 1-2x/week  PT DURATION: 12 weeks  PLANNED INTERVENTIONS: [id number]- PT Re-evaluation, 97750- Physical Performance Testing, 97110-Therapeutic exercises, 97530- Therapeutic activity, 97112- Neuromuscular re-education, 97535- Self Care, [id number]- Manual therapy, [phone]- Gait training, [phone]- Canalith repositioning, Patient/Family education, Balance training, Stair training, Joint mobilization, Vestibular training, Visual/preceptual remediation/compensation, Cognitive remediation, DME instructions, Cryotherapy, Moist heat, and Biofeedback  PLAN FOR NEXT SESSIONS:  *** - hip flexor stretch - supine bridges - sit<>stands - standing reaching  overhead/upright for improved hip extension and upright posture - gait training - standing marches/hip flexion for improved foot clearance     Rickell Wiehe, PT, DPT, NCS, CSRS Physical Therapist - [place]  [place]  [facility]  12:33 PM 03/27/24

## 2024-03-28 ENCOUNTER — Ambulatory Visit: Admitting: Physical Therapy

## 2024-03-28 ENCOUNTER — Encounter

## 2024-04-02 ENCOUNTER — Ambulatory Visit: Admitting: Physical Therapy

## 2024-04-02 ENCOUNTER — Ambulatory Visit

## 2024-04-02 NOTE — Therapy (Incomplete)
 OUTPATIENT OCCUPATIONAL THERAPY NEURO TREATMENT NOTE  Patient Name: Kerri [name] MRN: [id number] DOB:1931-06-22, 89 y.o., female Today's Date: 04/02/2024  PCP: Selinda Cyrus Moose, PA-C REFERRING PROVIDER: Maree Hila, MARLA, MD  END OF SESSION:   Past Medical History:  Diagnosis Date   Arthritis    Chronic systolic heart failure (HCC)    CKD (chronic kidney disease), stage III (HCC)    Dementia (HCC)    Hypertension    Hypothyroidism    Overactive bladder    Past Surgical History:  Procedure Laterality Date   INTRAMEDULLARY (IM) NAIL INTERTROCHANTERIC Left 09/06/2022   Procedure: INTRAMEDULLARY (IM) NAIL INTERTROCHANTERIC;  Surgeon: Tobie Priest, MD;  Location: [facility];  Service: Orthopedics;  Laterality: Left;   PACEMAKER LEADLESS INSERTION N/A 01/07/2021   Procedure: PACEMAKER LEADLESS INSERTION;  Surgeon: Ammon Blunt, MD;  Location: [facility];  Service: Cardiovascular;  Laterality: N/A;   PPM GENERATOR REMOVAL N/A 01/07/2021   Procedure: PPM GENERATOR REMOVAL;  Surgeon: Ammon Blunt, MD;  Location: [facility];  Service: Cardiovascular;  Laterality: N/A;   Patient Active Problem List   Diagnosis Date Noted   AKI (acute kidney injury) (HCC) 01/12/2024   Acute postoperative anemia due to expected blood loss 09/07/2022   Displaced intertrochanteric fracture of left femur, initial encounter for closed fracture (HCC) 09/05/2022   Orthostatic hypotension 07/12/2022   Diarrhea 07/10/2022   COVID-19 virus infection 07/08/2022   Electrolyte abnormality 07/08/2022   Malnutrition of moderate degree 07/08/2022   Acute delirium 07/08/2022   History of recurrent UTI (urinary tract infection) 01/30/2022   Constipation    UTI (urinary tract infection) 10/15/2021   Elevated brain natriuretic peptide (BNP) level 10/15/2021   Generalized weakness 10/15/2021   Chronic kidney disease, stage 3b (HCC) 10/15/2021   Normal pressure hydrocephalus (HCC)  10/15/2021   Dementia without behavioral disturbance (HCC) 10/15/2021   Hypothyroidism 10/15/2021   Essential hypertension 10/15/2021   Overactive bladder 10/15/2021   Moderate mitral regurgitation 01/13/2021   Status post placement of cardiac pacemaker 01/13/2021   CHB (complete heart block) (HCC) 01/07/2021   Chronic systolic CHF (congestive heart failure) (HCC) 12/17/2020   Postmenopausal osteoporosis 08/26/2016   Mixed Alzheimer's and vascular dementia (HCC) 06/13/2014   ONSET DATE: 09/05/2022  REFERRING DIAG: Intertrochanteric Left Femur Fracture, Lumbar Disc Disease with stenosis  THERAPY DIAG:  No diagnosis found.  Rationale for Evaluation and Treatment: Rehabilitation  SUBJECTIVE:  SUBJECTIVE STATEMENT: Pt reports ***doing well today. Pt accompanied by: [name]-Personal Caregiver  PERTINENT HISTORY:  03/07/24:  Pt returns today to resume therapy services after recent hospitalization for AKI.   Per chart: 89 with past medical history significant for hypothyroidism, recurrent UTIs, osteoarthritis, prior anterior trochanter fracture, CKD stage IIIb, chronic systolic heart failure, dementia, hypertension, history of cardiac pacemaker, history of normal pressure hydrocephalus, overreactive bladder. Patient was previously on suppressive Bactrim . She has been receiving workup outpatient with neurology. Neurology requested for urinalysis but patient was unable to provide a voided specimen. She went to urology for catheter catheterization UA. At urology office reported feeling weak. Outpatient labs revealed creatinine 2.9. She was sent to the ED for further evaluation of AKI.   Pt. Is a 89 y.o. female who sustained an Intertrochanteric Left Femur Fracture, Lumbar Disc Disease with Stenosis, Chronic compression Fractures. 09/05/2022, PMHx includes: Mixed Dementia (Alzheimer's Disease, and Vascular Dementia), Pacemaker, Mild spinal canal stenosis L2-3 & L3-4, Hx of UTI.  PRECAUTIONS:  Pacemaker (6-8 years)  WEIGHT BEARING RESTRICTIONS: No  PAIN:  Are you having pain? Yes, in the a.m. R leg, back   FALLS: Has patient fallen in last 6 months? No, 3 assisted sitdowns to the flloor- When Pt. feels like she is not able to walk any further.  LIVING ENVIRONMENT: Lives with: [name] Lives in: House Stairs:  One story with a basement; 2 steps to enter from the garage. In step down to the den; and and one step ramp into the kitchen Has following equipment at home: Wheelchair (manual), walker, hospital bed with rails, and a lift chair, BCommode, and shower chair. Gait belt, Life Alert, hand held shower head.  PLOF: Independent  PATIENT GOALS:  To be able to stand, and walk to the bathroom. Left hand -Pt. Is not using her left hand much  OBJECTIVE:  Note: Objective measures were completed at Evaluation unless otherwise noted.  HAND DOMINANCE: Right  ADLs: Now, occasional min A, CGA , eating no change, set up for grooming, set up for UB dressing,same for dressing, CGA toilet transfer, bathing same, mod A to lift legs over tub while seated on chair, before hip fx, occasional help with transfers at night or if tired.  Transfers/ambulation related to ADLs:  Eating: Independent, assist with set-up for cutting food Grooming: Independent set-up, seated  UB Dressing: Independent with set-up LB Dressing: Mod/MaxA pants, and socks Toileting: Assist with toilet transfers, clothing negotiation, Independent toilet hygiene. Some incontinence episodes Bathing: MaxA Tub Shower transfers: Max A shower transfers  IADLs: Shopping: Dependent. Will accompany at times. Light housekeeping: Occasionally wipes the table, and puts items in the trash, folds laundry when brought sitting Meal Prep: Will help assist with baking/mixing items, no cooking, meals provided for the Pt. Community mobility: Relies on family and friends Medication management: Medication management provided for the  Pt. Financial management:  No change from baseline  MOBILITY STATUS: Hx of Falls, Needs assist   FUNCTIONAL OUTCOME MEASURES: TBD  UPPER EXTREMITY ROM:    Active ROM Right eval Right 03/07/24 Left eval Left 03/07/24  Shoulder flexion 119(140) 130 (143) 112(130) 117 (130)  Shoulder abduction 112 160 (170) 98 135 (155)  Shoulder adduction      Shoulder extension      Shoulder internal rotation      Shoulder external rotation      Elbow flexion WNL  WNL   Elbow extension WNL  WNL   Wrist flexion      Wrist extension WNL  WNL   Wrist ulnar deviation      Wrist radial deviation      Wrist pronation      Wrist supination      (Blank rows = not tested)  UPPER EXTREMITY MMT:     MMT Right eval Right 03/07/24 Left eval Left 03/07/24  Shoulder flexion 3-/5 4+/5 within available range 3-/5 3+ within available range  Shoulder abduction 3-/5 4+/5 within available range 3-/5 3+ within available range  Shoulder adduction      Shoulder extension      Shoulder internal rotation      Shoulder external rotation      Middle trapezius      Lower trapezius      Elbow flexion 4/5 4+ 3+/5 4+  Elbow extension 4/5 4+ 3+/5 4+  Wrist flexion    4+  Wrist extension 4/5 4+ 3+/5 4+  Wrist ulnar deviation      Wrist radial deviation      Wrist pronation      Wrist supination      (  Blank rows = not tested)  HAND FUNCTION: Grip strength: Right: 29 lbs; Left: 21 lbs; Pinch strength: Lateral: R: 10#, L: 6#, 3pt. Pinch: R: 8#, L: 5# 03/07/24: Grip strength: Right: 45 lbs; Left: 27 lbs; Pinch strength: Lateral: R: 10 lbs, L: 7 lbs, 3 point pinch: Right: 6 lbs, L: 5#  COORDINATION:  9 Hole Peg test: Right: 1 min. & 2  sec; Left: 2 min. & 1 sec 03/07/24: 9 hole peg test: Right: 46 sec, Left: 1 min 24 sec   SENSATION: Light touch: Inconsistent  COGNITION: Overall cognitive status: History of cognitive impairments - at baseline Alzheimer's Dementia, and Vascular Dementia  VISION: Subjective  report:  Glasses all the time   VISION ASSESSMENT: Wears glasses all the time at baseline                                                                                                            TREATMENT DATE: 04/02/24 Therapeutic Activity: -Facilitated LUE GMC/FMC to increase engagement of LUE into daily tasks  -Activities with deck of cards: pt held deck in R hand and used LUE to flip and sort cards, 1 trial by number, 2nd trial by  Color.  Mod vc/tactile cues to prevent use of R hand for anything other than stabilizing deck.   -Coin manipulation skills: stacking, pick up from table, storing, translatory movements  -1# weight donned to L wrist for increasing GMC challenge while pt practiced item storage, translatory skills, and forward/lateral reaching to pick up dice and poker chips from table top and return to container using L hand. [facility] handout issued to pt/caregiver with review of select activities recommended for pt.   PATIENT EDUCATION: Education details: [facility] HEP Person educated: Patient and Caregiver [name] Education method: Explanation, demo, vc/tactile cues Education comprehension: verbalized understanding, demonstrated understanding  HOME EXERCISE PROGRAM: -Yellow theraputty with visual handout+ -FMC handout  GOALS: Goals reviewed with patient? Yes  SHORT TERM GOALS: Target date: 04/18/2024    Pt. Will require Supervision for BUE HEPs Baseline: Eval: No current HEP; 03/07/24: Will re-establish HEP d/t therapy interrupted with recent hospitalization Goal status: ongoing   LONG TERM GOALS: Target date: 05/30/2024  Pt. Will increase L shoulder strength by 1 mm grade to assist with ADLs/IADLs (revised on 03/07/24). Baseline: Eval: Right: shoulder flexion 3-/5, abduction 3-/5, elbow flexion 4/5, extension 4/5, wrist extension 4/5; Left: shoulder flexion: 3-/5, abduction: 3-/5, elbow flexion 3+/5, extension 3+/5, wrist extension 3+/5; 03/07/24: L shoulder flex/abd 3+/5 within  available range Goal status: ongoing  2.  Pt will increase L grip strength by 5 or more lbs to assist with hiking pants (revised on 03/07/24). Baseline: R: 29#, L: 21#; 03/07/24: L grip 27# Goal status: INITIAL  3.  Pt. Will initiate engaging the left hand during daily activity 75% of the time with minimal cuing Baseline: Eval: Limited initiation and engagement of the LUE during daily tasks.  10-15% engaging; 03/07/24: same as eval Goal status: ongoing  4.  Pt. Will demonstrate compensatory adaptive techniques to assist with ADLs/IADLs.  Baseline: Eval: Education to be provided; 03/07/24: ongoing Goal status: ongoing  ASSESSMENT:  CLINICAL IMPRESSION: ***Pt with good participation in therapeutic activities noted above this date.  Pt required ongoing mod vc and tactile cues to engage the L hand and utilize R hand only as a stabilizer for the deck of cards or the jar of dice/poker chips, OT or caregiver often having to hold pt's R hand on table top to prevent use.  Pt with good tolerance to 1# wrist weight for forward/lateral reaching at or below shoulder level.  Caregiver verbalized understanding of [facility] handout with OT assisting to identify beneficial activities specifically geared to pt's interests within handout.  Pt presented with increased challenge with translatory movements when manipulating dice, poker chips, and coins in hand, requiring increased time to move these items from palm to fingertips.  Pt was able to practice working with up to 3-4 of these items in hand at once with occasional dropping.  Pt. continues to benefit from OT services to work on improving LUE functioning in order to improve, and maximize participation with ADLs, and IADL tasks.  PERFORMANCE DEFICITS: in functional skills including ADLs, IADLs, coordination, dexterity, proprioception, ROM, strength, Fine motor control, Gross motor control, mobility, decreased knowledge of use of DME, and UE functional use, cognitive skills  including attention, problem solving, and safety awareness, and psychosocial skills including environmental adaptation and routines and behaviors.   IMPAIRMENTS: are limiting patient from ADLs, IADLs, education, and leisure.   CO-MORBIDITIES: has  other co-morbidities that affects occupational performance. Patient will benefit from skilled OT to address above impairments and improve overall function.  MODIFICATION OR ASSISTANCE TO COMPLETE EVALUATION: Min-Moderate modification of tasks or assist with assess necessary to complete an evaluation.  OT OCCUPATIONAL PROFILE AND HISTORY: Detailed assessment: Review of records and additional review of physical, cognitive, psychosocial history related to current functional performance.  CLINICAL DECISION MAKING: Moderate - several treatment options, min-mod task modification necessary  REHAB POTENTIAL: Good  EVALUATION COMPLEXITY: Moderate    PLAN:  OT FREQUENCY: 1x/week  OT DURATION: 12 weeks  PLANNED INTERVENTIONS: 97168 OT Re-evaluation, 97535 self care/ADL training, 02889 therapeutic exercise, 97530 therapeutic activity, 97112 neuromuscular re-education, 97140 manual therapy, 97018 paraffin, 02989 moist heat, 97034 contrast bath, passive range of motion, functional mobility training, patient/family education, and DME and/or AE instructions  RECOMMENDED OTHER SERVICES: Pt currently working with PT  CONSULTED AND AGREED WITH PLAN OF CARE: Patient and family member/caregiver  PLAN FOR NEXT SESSION: see above  Elston Slot, M.S. OTR/L  04/02/24, 8:26 AM  ascom [phone]   04/02/2024, 8:26 AM

## 2024-04-04 ENCOUNTER — Ambulatory Visit: Admitting: Physical Therapy

## 2024-04-04 ENCOUNTER — Encounter

## 2024-04-05 ENCOUNTER — Telehealth: Payer: Self-pay | Admitting: Physical Therapy

## 2024-04-05 ENCOUNTER — Ambulatory Visit: Admitting: Physical Therapy

## 2024-04-05 NOTE — Telephone Encounter (Signed)
 Pt's caregiver contacted via telephone and author left voice mail informing of missed appointment and informed of future PT appointment date and time.   Connell Kiss, PT, DPT, NCS, CSRS

## 2024-04-09 ENCOUNTER — Ambulatory Visit

## 2024-04-09 ENCOUNTER — Ambulatory Visit: Admitting: Physical Therapy

## 2024-04-09 DIAGNOSIS — R278 Other lack of coordination: Secondary | ICD-10-CM

## 2024-04-09 DIAGNOSIS — R2681 Unsteadiness on feet: Secondary | ICD-10-CM

## 2024-04-09 DIAGNOSIS — M6281 Muscle weakness (generalized): Secondary | ICD-10-CM | POA: Diagnosis not present

## 2024-04-09 DIAGNOSIS — R269 Unspecified abnormalities of gait and mobility: Secondary | ICD-10-CM

## 2024-04-09 DIAGNOSIS — R5381 Other malaise: Secondary | ICD-10-CM

## 2024-04-09 DIAGNOSIS — R262 Difficulty in walking, not elsewhere classified: Secondary | ICD-10-CM

## 2024-04-09 NOTE — Therapy (Signed)
 OUTPATIENT OCCUPATIONAL THERAPY NEURO TREATMENT NOTE  Patient Name: Kerri [name] MRN: [id number] DOB:30-Oct-1930, 89 y.o., female Today's Date: 04/09/2024  PCP: Selinda Cyrus Moose, PA-C REFERRING PROVIDER: Maree Hila, MARLA, MD  END OF SESSION:  OT End of Session - 04/09/24 1420     Visit Number 8    Number of Visits 18    Date for OT Re-Evaluation 05/30/24    OT Start Time 1403    OT Stop Time 1445    OT Time Calculation (min) 42 min    Equipment Utilized During Treatment wc    Activity Tolerance Patient tolerated treatment well    Behavior During Therapy WFL for tasks assessed/performed         Past Medical History:  Diagnosis Date   Arthritis    Chronic systolic heart failure (HCC)    CKD (chronic kidney disease), stage III (HCC)    Dementia (HCC)    Hypertension    Hypothyroidism    Overactive bladder    Past Surgical History:  Procedure Laterality Date   INTRAMEDULLARY (IM) NAIL INTERTROCHANTERIC Left 09/06/2022   Procedure: INTRAMEDULLARY (IM) NAIL INTERTROCHANTERIC;  Surgeon: Tobie Priest, MD;  Location: [facility];  Service: Orthopedics;  Laterality: Left;   PACEMAKER LEADLESS INSERTION N/A 01/07/2021   Procedure: PACEMAKER LEADLESS INSERTION;  Surgeon: Ammon Blunt, MD;  Location: [facility];  Service: Cardiovascular;  Laterality: N/A;   PPM GENERATOR REMOVAL N/A 01/07/2021   Procedure: PPM GENERATOR REMOVAL;  Surgeon: Ammon Blunt, MD;  Location: [facility];  Service: Cardiovascular;  Laterality: N/A;   Patient Active Problem List   Diagnosis Date Noted   AKI (acute kidney injury) (HCC) 01/12/2024   Acute postoperative anemia due to expected blood loss 09/07/2022   Displaced intertrochanteric fracture of left femur, initial encounter for closed fracture (HCC) 09/05/2022   Orthostatic hypotension 07/12/2022   Diarrhea 07/10/2022   COVID-19 virus infection 07/08/2022   Electrolyte abnormality 07/08/2022   Malnutrition of  moderate degree 07/08/2022   Acute delirium 07/08/2022   History of recurrent UTI (urinary tract infection) 01/30/2022   Constipation    UTI (urinary tract infection) 10/15/2021   Elevated brain natriuretic peptide (BNP) level 10/15/2021   Generalized weakness 10/15/2021   Chronic kidney disease, stage 3b (HCC) 10/15/2021   Normal pressure hydrocephalus (HCC) 10/15/2021   Dementia without behavioral disturbance (HCC) 10/15/2021   Hypothyroidism 10/15/2021   Essential hypertension 10/15/2021   Overactive bladder 10/15/2021   Moderate mitral regurgitation 01/13/2021   Status post placement of cardiac pacemaker 01/13/2021   CHB (complete heart block) (HCC) 01/07/2021   Chronic systolic CHF (congestive heart failure) (HCC) 12/17/2020   Postmenopausal osteoporosis 08/26/2016   Mixed Alzheimer's and vascular dementia (HCC) 06/13/2014   ONSET DATE: 09/05/2022  REFERRING DIAG: Intertrochanteric Left Femur Fracture, Lumbar Disc Disease with stenosis  THERAPY DIAG:  Debility  Other lack of coordination  Muscle weakness (generalized)  Rationale for Evaluation and Treatment: Rehabilitation  SUBJECTIVE:  SUBJECTIVE STATEMENT: Pt reports doing working hard in PT today. Pt accompanied by: [name]-Personal Caregiver  PERTINENT HISTORY:  03/07/24:  Pt returns today to resume therapy services after recent hospitalization for AKI.   Per chart: 89 with past medical history significant for hypothyroidism, recurrent UTIs, osteoarthritis, prior anterior trochanter fracture, CKD stage IIIb, chronic systolic heart failure, dementia, hypertension, history of cardiac pacemaker, history of normal pressure hydrocephalus, overreactive bladder. Patient was previously on suppressive Bactrim . She has been receiving workup outpatient with neurology. Neurology requested for urinalysis but  patient was unable to provide a voided specimen. She went to urology for catheter catheterization UA. At urology office reported  feeling weak. Outpatient labs revealed creatinine 2.9. She was sent to the ED for further evaluation of AKI.   Pt. Is a 89 y.o. female who sustained an Intertrochanteric Left Femur Fracture, Lumbar Disc Disease with Stenosis, Chronic compression Fractures. 09/05/2022, PMHx includes: Mixed Dementia (Alzheimer's Disease, and Vascular Dementia), Pacemaker, Mild spinal canal stenosis L2-3 & L3-4, Hx of UTI.  PRECAUTIONS: Pacemaker (6-8 years)  WEIGHT BEARING RESTRICTIONS: No  PAIN:  Are you having pain? Yes, in the a.m. R leg, back   FALLS: Has patient fallen in last 6 months? No, 3 assisted sitdowns to the flloor- When Pt. feels like she is not able to walk any further.  LIVING ENVIRONMENT: Lives with: [name] Lives in: House Stairs:  One story with a basement; 2 steps to enter from the garage. In step down to the den; and and one step ramp into the kitchen Has following equipment at home: Wheelchair (manual), walker, hospital bed with rails, and a lift chair, BCommode, and shower chair. Gait belt, Life Alert, hand held shower head.  PLOF: Independent  PATIENT GOALS:  To be able to stand, and walk to the bathroom. Left hand -Pt. Is not using her left hand much  OBJECTIVE:  Note: Objective measures were completed at Evaluation unless otherwise noted.  HAND DOMINANCE: Right  ADLs: Now, occasional min A, CGA , eating no change, set up for grooming, set up for UB dressing,same for dressing, CGA toilet transfer, bathing same, mod A to lift legs over tub while seated on chair, before hip fx, occasional help with transfers at night or if tired.  Transfers/ambulation related to ADLs:  Eating: Independent, assist with set-up for cutting food Grooming: Independent set-up, seated  UB Dressing: Independent with set-up LB Dressing: Mod/MaxA pants, and socks Toileting: Assist with toilet transfers, clothing negotiation, Independent toilet hygiene. Some incontinence episodes Bathing: MaxA Tub Shower  transfers: Max A shower transfers  IADLs: Shopping: Dependent. Will accompany at times. Light housekeeping: Occasionally wipes the table, and puts items in the trash, folds laundry when brought sitting Meal Prep: Will help assist with baking/mixing items, no cooking, meals provided for the Pt. Community mobility: Relies on family and friends Medication management: Medication management provided for the Pt. Financial management:  No change from baseline  MOBILITY STATUS: Hx of Falls, Needs assist   FUNCTIONAL OUTCOME MEASURES: TBD  UPPER EXTREMITY ROM:    Active ROM Right eval Right 03/07/24 Left eval Left 03/07/24  Shoulder flexion 119(140) 130 (143) 112(130) 117 (130)  Shoulder abduction 112 160 (170) 98 135 (155)  Shoulder adduction      Shoulder extension      Shoulder internal rotation      Shoulder external rotation      Elbow flexion WNL  WNL   Elbow extension WNL  WNL   Wrist flexion      Wrist extension WNL  WNL   Wrist ulnar deviation      Wrist radial deviation      Wrist pronation      Wrist supination      (Blank rows = not tested)  UPPER EXTREMITY MMT:     MMT Right eval Right 03/07/24 Left eval Left 03/07/24  Shoulder flexion 3-/5 4+/5 within available range 3-/5 3+ within available range  Shoulder abduction 3-/5 4+/5 within available range 3-/5 3+ within available range  Shoulder adduction  Shoulder extension      Shoulder internal rotation      Shoulder external rotation      Middle trapezius      Lower trapezius      Elbow flexion 4/5 4+ 3+/5 4+  Elbow extension 4/5 4+ 3+/5 4+  Wrist flexion    4+  Wrist extension 4/5 4+ 3+/5 4+  Wrist ulnar deviation      Wrist radial deviation      Wrist pronation      Wrist supination      (Blank rows = not tested)  HAND FUNCTION: Grip strength: Right: 29 lbs; Left: 21 lbs; Pinch strength: Lateral: R: 10#, L: 6#, 3pt. Pinch: R: 8#, L: 5# 03/07/24: Grip strength: Right: 45 lbs; Left: 27 lbs; Pinch  strength: Lateral: R: 10 lbs, L: 7 lbs, 3 point pinch: Right: 6 lbs, L: 5#  COORDINATION:  9 Hole Peg test: Right: 1 min. & 2  sec; Left: 2 min. & 1 sec 03/07/24: 9 hole peg test: Right: 46 sec, Left: 1 min 24 sec   SENSATION: Light touch: Inconsistent  COGNITION: Overall cognitive status: History of cognitive impairments - at baseline Alzheimer's Dementia, and Vascular Dementia  VISION: Subjective report:  Glasses all the time   VISION ASSESSMENT: Wears glasses all the time at baseline                                                                                                            TREATMENT DATE: 04/09/24 Therapeutic Activity: -Facilitated LUE GMC/FMC to increase engagement of LUE into daily tasks  -1# weight donned to L wrist for increasing GMC challenge while pt practiced item storage, translatory skills, and forward/lateral reaching to pick up knob pegs and place them on pegboard.  -pt manipulated nuts and bolts positioned vertically. With MIN verbal and tactile cues pt unscrewed x2 each 1 and 1/2 nuts, however was unable to remove the nuts without dropping them.  -Pt completed the Box and Block test with 28 blocks on R hand and 25 blocks on L hand. Requires cues for one block at a time. -Put blocks away 3 or 4 at a time with good storage in palm.  Therapeutic Exercise:  -reviewed HEP -seated exercises with 1# wrist weight 1 set x 10 reps each: shoulder flexion, shoulder horizontal adduction/abduction, scapular retraction, table assisted shoulder protraction  PATIENT EDUCATION: Education details: [facility] HEP Person educated: Patient and Caregiver [name] Education method: Explanation, demo, vc/tactile cues Education comprehension: verbalized understanding, demonstrated understanding  HOME EXERCISE PROGRAM: -Yellow theraputty with visual handout+ -FMC handout  GOALS: Goals reviewed with patient? Yes  SHORT TERM GOALS: Target date: 04/18/2024    Pt. Will require  Supervision for BUE HEPs Baseline: Eval: No current HEP; 03/07/24: Will re-establish HEP d/t therapy interrupted with recent hospitalization Goal status: ongoing   LONG TERM GOALS: Target date: 05/30/2024  Pt. Will increase L shoulder strength by 1 mm grade to assist with ADLs/IADLs (revised on 03/07/24). Baseline: Eval: Right: shoulder flexion 3-/5, abduction 3-/5, elbow flexion 4/5,  extension 4/5, wrist extension 4/5; Left: shoulder flexion: 3-/5, abduction: 3-/5, elbow flexion 3+/5, extension 3+/5, wrist extension 3+/5; 03/07/24: L shoulder flex/abd 3+/5 within available range Goal status: ongoing  2.  Pt will increase L grip strength by 5 or more lbs to assist with hiking pants (revised on 03/07/24). Baseline: R: 29#, L: 21#; 03/07/24: L grip 27# Goal status: INITIAL  3.  Pt. Will initiate engaging the left hand during daily activity 75% of the time with minimal cuing Baseline: Eval: Limited initiation and engagement of the LUE during daily tasks.  10-15% engaging; 03/07/24: same as eval Goal status: ongoing  4.  Pt. Will demonstrate compensatory adaptive techniques to assist with ADLs/IADLs. Baseline: Eval: Education to be provided; 03/07/24: ongoing Goal status: ongoing  ASSESSMENT:  CLINICAL IMPRESSION: Pt required MOD verbal and tactile cues to engage the L hand throughout tasks. Pt with good tolerance to 1# wrist weight for forward/lateral reaching at or below shoulder level.  Pt presented with increased challenge with translatory movements when manipulating knob pegs. Pt completed the Box and Block test with R hand 28, L hand 25 - reports it is easy with her L.  Pt. continues to benefit from OT services to work on improving LUE functioning in order to improve, and maximize participation with ADLs, and IADL tasks.  PERFORMANCE DEFICITS: in functional skills including ADLs, IADLs, coordination, dexterity, proprioception, ROM, strength, Fine motor control, Gross motor control, mobility,  decreased knowledge of use of DME, and UE functional use, cognitive skills including attention, problem solving, and safety awareness, and psychosocial skills including environmental adaptation and routines and behaviors.   IMPAIRMENTS: are limiting patient from ADLs, IADLs, education, and leisure.   CO-MORBIDITIES: has  other co-morbidities that affects occupational performance. Patient will benefit from skilled OT to address above impairments and improve overall function.  MODIFICATION OR ASSISTANCE TO COMPLETE EVALUATION: Min-Moderate modification of tasks or assist with assess necessary to complete an evaluation.  OT OCCUPATIONAL PROFILE AND HISTORY: Detailed assessment: Review of records and additional review of physical, cognitive, psychosocial history related to current functional performance.  CLINICAL DECISION MAKING: Moderate - several treatment options, min-mod task modification necessary  REHAB POTENTIAL: Good  EVALUATION COMPLEXITY: Moderate    PLAN:  OT FREQUENCY: 1x/week  OT DURATION: 12 weeks  PLANNED INTERVENTIONS: 97168 OT Re-evaluation, 97535 self care/ADL training, 02889 therapeutic exercise, 97530 therapeutic activity, 97112 neuromuscular re-education, 97140 manual therapy, 97018 paraffin, 02989 moist heat, 97034 contrast bath, passive range of motion, functional mobility training, patient/family education, and DME and/or AE instructions  RECOMMENDED OTHER SERVICES: Pt currently working with PT  CONSULTED AND AGREED WITH PLAN OF CARE: Patient and family member/caregiver  PLAN FOR NEXT SESSION: see above  Elston Slot, M.S. OTR/L  04/09/24, 2:23 PM  ascom [phone]   04/09/2024, 2:23 PM

## 2024-04-09 NOTE — Therapy (Signed)
 OUTPATIENT PHYSICAL THERAPY NEURO TREATMENT   Patient Name: Kerri [name] MRN: [id number] DOB:12-20-1930, 89 y.o., female Today's Date: 04/09/2024   PCP: Cyrus Selinda Moose, PA-C  REFERRING PROVIDER: Cyrus Selinda Moose, PA-C   END OF SESSION:   PT End of Session - 04/09/24 1319     Visit Number 5    Number of Visits 24    Date for PT Re-Evaluation 05/30/24    Authorization Time Period [employer] [id number] for 4 PT vistis from 5/28-6/25 (6/4 is 3 of 4)    PT Start Time 1319    PT Stop Time 1404    PT Time Calculation (min) 45 min    Equipment Utilized During Treatment Gait belt    Activity Tolerance Patient tolerated treatment well;No increased pain    Behavior During Therapy WFL for tasks assessed/performed             Past Medical History:  Diagnosis Date   Arthritis    Chronic systolic heart failure (HCC)    CKD (chronic kidney disease), stage III (HCC)    Dementia (HCC)    Hypertension    Hypothyroidism    Overactive bladder    Past Surgical History:  Procedure Laterality Date   INTRAMEDULLARY (IM) NAIL INTERTROCHANTERIC Left 09/06/2022   Procedure: INTRAMEDULLARY (IM) NAIL INTERTROCHANTERIC;  Surgeon: Tobie Priest, MD;  Location: [facility];  Service: Orthopedics;  Laterality: Left;   PACEMAKER LEADLESS INSERTION N/A 01/07/2021   Procedure: PACEMAKER LEADLESS INSERTION;  Surgeon: Ammon Blunt, MD;  Location: [facility];  Service: Cardiovascular;  Laterality: N/A;   PPM GENERATOR REMOVAL N/A 01/07/2021   Procedure: PPM GENERATOR REMOVAL;  Surgeon: Ammon Blunt, MD;  Location: [facility];  Service: Cardiovascular;  Laterality: N/A;   Patient Active Problem List   Diagnosis Date Noted   AKI (acute kidney injury) (HCC) 01/12/2024   Acute postoperative anemia due to expected blood loss 09/07/2022   Displaced intertrochanteric fracture of left femur, initial encounter for closed fracture (HCC) 09/05/2022   Orthostatic  hypotension 07/12/2022   Diarrhea 07/10/2022   COVID-19 virus infection 07/08/2022   Electrolyte abnormality 07/08/2022   Malnutrition of moderate degree 07/08/2022   Acute delirium 07/08/2022   History of recurrent UTI (urinary tract infection) 01/30/2022   Constipation    UTI (urinary tract infection) 10/15/2021   Elevated brain natriuretic peptide (BNP) level 10/15/2021   Generalized weakness 10/15/2021   Chronic kidney disease, stage 3b (HCC) 10/15/2021   Normal pressure hydrocephalus (HCC) 10/15/2021   Dementia without behavioral disturbance (HCC) 10/15/2021   Hypothyroidism 10/15/2021   Essential hypertension 10/15/2021   Overactive bladder 10/15/2021   Moderate mitral regurgitation 01/13/2021   Status post placement of cardiac pacemaker 01/13/2021   CHB (complete heart block) (HCC) 01/07/2021   Chronic systolic CHF (congestive heart failure) (HCC) 12/17/2020   Postmenopausal osteoporosis 08/26/2016   Mixed Alzheimer's and vascular dementia (HCC) 06/13/2014    ONSET DATE: Progressive decline over ~1-1.5 years  REFERRING DIAG:  M51.369 (ICD-10-CM) - Other intervertebral disc degeneration, lumbar region without mention of lumbar back pain or lower extremity pain  R53.81 (ICD-10-CM) - Other malaise  M62.81 (ICD-10-CM) - Muscle weakness (generalized)    THERAPY DIAG:  Muscle weakness (generalized)  Other lack of coordination  Debility  Difficulty in walking, not elsewhere classified  Abnormality of gait and mobility  Unsteadiness on feet  Rationale for Evaluation and Treatment: Rehabilitation  SUBJECTIVE:  SUBJECTIVE STATEMENT:  Pt's caregiver, [name], apologizes for missing last visit, reporting she mixed the days up. Pt states she enjoys coming to therapy. No reports of pain to  start session.  Therapist, patient, and Caregiver discussed pt's limited number of visits with current insurance auth for only 3 visits with plan to space them out to 1 visit per week for the next 3 weeks. Therapist educated that pt would benefit from additional therapy especially now that she has been making improvements following most recent hospitalization; however, unsure if insurance will approve for additional visits after these 3.  From Eval: Patient previously known to this clinic. Patient with recent short hospitalization following AKI superimposed on CKI stage III with UTI. Pt received HHPT prior to returning to OPPT.  [name], caregiver, reports having taken patient to MD due to pt having decreased interest in doing things and then MD found out pt's kidney levels were low, resulting in her hospitalization.   Denies pain. Denies falls/stumbles at home.  Pt accompanied by: caregiver, [name]   PERTINENT HISTORY:  PMH: hypothyroidism, recurrent UTIs, osteoarthritis, prior anterior trochanter fracture, CKD stage IIIb, chronic systolic heart failure, dementia, hypertension, history of cardiac pacemaker, history of normal pressure hydrocephalus, overreactive bladder   Most recent A&P from Neurology note on 12/21/2023: Mixed dementia (Alzheimer's disease + vascular dementia) with significant ventriculomegaly (with negative lumbar drain trial in 2015) in a patient with history of sepsis secondary to UTI treated with trimethoprim  January 2023. Medication appears to help with UTI and improved mental status - reports good control with current medication Progressive behavioral disturbances with mood swings, outbursts, and apathy, exacerbated during the day. Seroquel (quetiapine) was initiated but caused significant lethargy. Alternative medications, lamotrigine and Depakote, were discussed for her mood-stabilizing properties and different tolerability profiles. - Discuss with primary care provider about  reducing Seroquel dose to assess tolerability. Consider 1/2 tablet (12.5 mg) instead.  - If unable to tolerate Seroquel at lower dose, will consider trial of Depakote Valproic Acid is an antiepileptic drug, used for many different conditions. Patient was advised not to stop medication abruptly, not to use alcohol. Acute side-effects can be abdominal pain, N/V, diarrhea and increased ammonia level. Long term side-effects are hair loss, weight gain, tremors and amenorrhea. Rarely, it can cause low platelets, liver failure and birth defects.  - Consider alternative medications such as lamotrigine or Depakote if Seroquel remains intolerable. - Document alternative medications in the chart for future reference.  Fatigue and somnolence Increased fatigue and somnolence potentially related to Seroquel, hypothyroidism, and medication side effects.  - Monitor heart rate and adjust medications if fatigue persists and heart rate remains low.  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 Symptoms include leg pain, particularly in the right leg, and dragging of the leg during ambulation. Physical therapy has been beneficial. Her symptoms arte chronic. She has had a reportedly negative lower extremity doppler, unavailable for my review.  Right leg pain with inability to pick up feet (right>left - likely from ventriculomegaly + peripheral neuropathy) patient with history of lumbar disc disease with stenosis - reports improvement during recent trial of prednisone. Suspicious for lumbar Disc Disease in addition to decondition Reviewed PCP note: Ongoing leg pain with intermittent weakness. On exam she does have some swelling around the right knee. Unsure if she could have some severe arthritis catching as she walks. Will obtain x-ray of the right knee today. Trial of prednisone 20 mg  once a day for 5 days. We did  discuss that she has some degenerative lumbar disc disease with stenosis and likely playing a role in right leg weakness. She has a pacemaker and we will defer MRI of the lumbar spine. Keep follow-up with neurology and if they feel it is necessary they could possibly pursue nerve conduction study. Inform caretaker to help patient and stay by her side when walking.   - Patient says that her legs feel heavy and that she has difficulty initiating the first step when walking - consistent with magnetic gait in patient with hydrocephalus and brain atrophy - Previous (-) Lumbar Puncture trial at [facility] (01/09/14). - Continue physical therapy to manage symptoms and improve mobility. - strict Emergency Room/return precautions    PAIN:  Are you having pain? No  PRECAUTIONS: Fall and ICD/Pacemaker  RED FLAGS: None and hx of spinal stenosis   WEIGHT BEARING RESTRICTIONS: No  FALLS: Has patient fallen in last 6 months? No  LIVING ENVIRONMENT: Lives with: caregiver 7 days a week. Alone for an hour or 2 a day, but only when alseep.   Lives in: House/apartment Stairs: Yes: Internal: 1 but ramp in place steps; none and External: 1 steps; none Has following equipment at home: Walker - 2 wheeled; transport chair; shower chair and grab bars   PLOF: Needs assistance with ADLs, Needs assistance with gait, Needs assistance with transfers, and Needs assistance with ambulation using RW - Primarily uses transport chair for mobility, but will ambulate with assistance to/from bathroom using RW in the home  PATIENT GOALS: Help her walking be easier and help her get stronger   OBJECTIVE:  Note: Objective measures were completed at Evaluation unless otherwise noted.  DIAGNOSTIC FINDINGS:   EXAM (from 10/28/2022): CT LUMBAR SPINE WITHOUT CONTRAST IMPRESSION: 1. Transitional anatomy. Adhering to convention from the prior lumbar spine MRI, the last fully formed disc space is labeled L5-S1. 2. Unchanged  chronic compression fractures of L4 and L5. No new fracture of the lumbar spine. 3. Unchanged lumbar spondylosis notable for moderate narrowing of the right lateral recess at L1-2 and L2-3, as well as mild spinal canal stenosis at L2-3 and L3-4.  Electronically Signed   By: Ryan Chess M.D.   On: 10/28/2022 16:42   EXAM (from 09/09/2022): CT HEAD WITHOUT CONTRAST  COMPARISON: CT head 09/05/2022, 08/15/2021   IMPRESSION: 1. No acute abnormality and no change from prior studies. 2. Atrophy and chronic microvascular ischemic change in the white matter.   Electronically Signed   By: Carlin Gaskins M.D.   On: 09/09/2022 11:50    COGNITION: Overall cognitive status: History of cognitive impairments - at baseline   SENSATION: Light touch: WFL on screen, but pt with hx of neuropathy in B LEs  COORDINATION: Not formally assessed, likely impacted by strength deficits as noted below  EDEMA:  Not formally assessed, but no significant edema noted  MUSCLE TONE: Not formally assessed   MUSCLE LENGTH: Not formally assessed   DTRs:  Not formally assessed   POSTURE: rounded shoulders, forward head, increased thoracic kyphosis, posterior pelvic tilt, and flexed trunk    LOWER EXTREMITY ROM:     Active  Right Eval Left Eval  Hip flexion Limited AROM by weakness Limited AROM by weakness (more impaired than R)  Hip extension Decreased ROM Decreased ROM  Hip abduction    Hip adduction    Hip internal rotation    Hip external rotation    Knee flexion [facility]  WFL  Knee extension [facility]  Ankle dorsiflexion [facility] WFL  Ankle plantarflexion [facility] WFL  Ankle inversion    Ankle eversion     (Blank rows = not tested)  LOWER EXTREMITY MMT:    MMT Right Eval Left Eval  Hip flexion 3+ 3-  Hip extension    Hip abduction    Hip adduction    Hip internal rotation    Hip external rotation    Knee flexion 4- 4-  Knee extension 4- 4-  Ankle dorsiflexion 4- 3-  Ankle  plantarflexion 3+ 4-  Ankle inversion    Ankle eversion    (Blank rows = not tested)  Manual Muscle Test Scale 0/5 = No muscle contraction can be seen or felt 1/5 = Contraction can be felt, but there is no motion 2-/5 = Part moves through incomplete ROM w/ gravity decreased 2/5 = Part moves through complete ROM w/ gravity decreased 2+/5 = Part moves through incomplete ROM (<50%) against gravity or through complete ROM w/ gravity 3-/5 = Part moves through incomplete ROM (>50%) against gravity 3/5 = Part moves through complete ROM against gravity 3+/5 = Part moves through complete ROM against gravity/slight resistance 4-/5= Holds test position against slight to moderate pressure 4/5 = Part moves through complete ROM against gravity/moderate resistance 4+/5= Holds test position against moderate to strong pressure 5/5 = Part moves through complete ROM against gravity/full resistance  BED MOBILITY:  Findings: Sit to supine Min A for B LE management onto mat Supine to sit Min A for trunk upright with continued impaired motor plan Pt doesn't recall previously trained logroll technique to increase her independence, could be due to impaired carryover of technique in the clinic space Caregiver reports bed mobility has improved slightly at home  Pt with significant difficulty performing anterior scooting towards EOM requiring mod A for this   TRANSFERS: Sit to stand: Min A  Assistive device utilized: Environmental consultant - 2 wheeled     Stand to sit: Min A  Assistive device utilized: Environmental consultant - 2 wheeled     Chair to chair: Min A  Assistive device utilized: Environmental consultant - 2 wheeled      *continues to require cuing to turn fully prior to initiating sitting   RAMP:  Not tested  CURB:  Not tested  STAIRS: Not tested GAIT: Findings: Gait Characteristics: step to pattern, step through pattern, decreased step length- Right, decreased step length- Left, decreased stride length, decreased hip/knee flexion- Right,  decreased hip/knee flexion- Left, decreased ankle dorsiflexion- Right, decreased ankle dorsiflexion- Left, trunk flexed, narrow BOS, poor foot clearance- Right, and poor foot clearance- Left, Distance walked: ~59ft x2, Assistive device utilized:Walker - 2 wheeled, Level of assistance: Min A, and Comments: severely decreased gait speed  FUNCTIONAL TESTS:  5 times sit to stand: 83 seconds Timed up and go (TUG):  78min54 seconds using RW with min A 10 meter walk test: needs to be assessed  PATIENT SURVEYS:  PsFS: average: 3.67  Patient will completely turn 90degrees prior to sitting down during transfers with minimal cuing: 3  Patient will be able to maintain standing balance using B UE support on RW with only SBA while aide provides total A for LB clothing or ADLs: 5 Patient will stand with erect/upright posture when walking: 3  TREATMENT DATE: 04/09/2024   Pt arrives to therapy in her personal transport chair.  L stand pivot transport chair>EOM using RW with light min A for lifting to stand then CGA/light min A for steadying when turning min cuing to turn fully and step back towards seat keeping AD with her, prior to initiating sitting  Repeated sit>stands from EOM>RW and then tossing hedgehogs over mirror x8 reps Requires consistent cuing to recall to push-up from EOM rather than pulling up on RW Goal of promoting increased hip extension and upright posture in standing to reach over this tall target  Gait training ~49ft to // bars using RW with light min A for balance safety. Pt continues to demo the below deviations: R foot drag during swing  Excessive hip/knee flexion with crouched posturing Decreased step lengths bilaterally (R more impaired)   In // bars performed:  Forward gait focused on reciprocal stepping pattern with B UE support  down/back 2 laps + 3  laps (seated break between) Min A for balance and cuing for increased hip/knee extension during stance to improve upright  Noticed improved upright posture in // bars compared to when using RW More consistent reciprocal stepping pattern, but continues to have L hip drop during L stance causing worsening R foot clearance during swing, but improved compared to when ambulating with RW to // bars Pt reports this as tiring and one of the hardest things she has done Side stepping  2 laps + 1 lap (seated break between) Skilled min A for balance Requires repeated cuing to take larger step Continues to have more difficulty stepping towards R compared to L Pt does have more crouched posture with side stepping compared to forward, likely due to weakness in hip abductors Foot taps to green step in // bars with B UE support  X12 reps per LE  Mirror feedback, which pt used, to help maintain improved upright posture With fatigue, pt has greater hip drop (more impaired on L compared to R) and more narrow BOS, but started out with significant improvement in upright posture and BOS, which resulted in pt easily lifting each LE up onto the step During sit>stands at // bars pt requires mod A to come to stand due to feet being elevated slightly   PATIENT EDUCATION: Education details: Findings on assessments today, comparison of pt's CLOF compared to last seen by this therapist, therapy POC and LTGs, education on continuing to encourage pt to stand every ~1 hour and ambulate to/from bathroom using RW and aid's assistance Person educated: Patient and Caregiver [name] Education method: Explanation Education comprehension: verbalized understanding and needs further education  HOME EXERCISE PROGRAM:  Access Code: [email or web address] URL: [email or web address] Date: 12/20/2023 Prepared by: Connell Kiss   Exercises - Sit to Stand with Armchair  - 1 x daily - 7 x weekly - 3 sets - 5 reps - Standing March with  Counter Support  - 1 x daily - 7 x weekly - 2 sets - 10 reps - Supine Bridge  - 1 x daily - 7 x weekly - 2 sets - 10 reps - Supine Heel Slide with Strap  - 1 x daily - 7 x weekly - 2 sets - 10 reps - Supine Hip Abduction AROM  - 1 x daily - 7 x weekly - 3 sets - 10 reps - Seated Long Arc Quad  - 1 x daily - 7 x weekly - 3 sets - 10 reps - Seated March  - 1 x daily -  7 x weekly - 3 sets - 10 reps - 2 hold - Seated Hip Abduction with Resistance  - 1 x daily - 7 x weekly - 2 sets - 10 reps   GOALS: Goals reviewed with patient? Yes  SHORT TERM GOALS: Target date: 04/18/2024  Patient will be independent in home exercise program to improve strength/mobility for better functional independence with ADLs.  Baseline: need to initiate Goal status: INITIAL   LONG TERM GOALS: Target date: 05/30/2024  Patient will perform bed mobility with consistently no more than min A utilizing bed features/supports as needed and with no more than than min cuing from aide/caregiver.  Baseline: requires up to mod A and mod/max verbal cuing Goal status: INITIAL  2.  Patient will increase PsFS score to equal to or greater than and average of 2 points to demonstrate statistically significant improvement in mobility and quality of life.  Baseline: 3.667 Goal status: INITIAL  3.  Patient (> 87 years old) will complete five times sit to stand test in < 30 seconds indicating an increased LE strength and improved balance.  Baseline: 83 seconds from her transport chair with RW in front of her and min progressed to light mod A  Goal status: INITIAL   4.  Patient will reduce timed up and go to <60 sec seconds to reduce fall risk and demonstrate improved transfer/gait ability.  Baseline: 54min54 seconds using RW with min A  Goal status: INITIAL  5.  Patient will increase 10 meter walk test by 0.25m/s as to improve gait speed for better household level ambulation and to reduce fall risk.  Baseline: 0.030m/s with RW  Goal  status: INITIAL   ASSESSMENT:  CLINICAL IMPRESSION:  Patient is a 89 y.o. female who was seen today for physical therapy treatment for generalized weakness with significant B LE weakness, impaired balance, impaired endurance, impaired motor planning, impaired bed mobility, transfers, and gait due to multiple comorbidities including Alzheimer's disease + vascular dementia. Patient motivated to participate in therapy session to get better. Patient demonstrates improved ability to recall turning fully prior to initiating sitting during transfers. Therapy session focused on functional B LE strengthening during sit<>stands as well as gait training in // bars with focus on improved trunk/hip/knee extension for upright posture and improved stepping mechanics. Patient demonstrates significantly improved upright posture in // bars compared to when ambulating with RW, which resulted in improved R LE foot clearance, and increased step lengths bilaterally. Patient reports gait training in // bars as very challenging and will benefit from continuation of this to promote improved B LE functional strengthening and gait.  Ms. Duffell will benefit from further skilled PT to improve these deficits in order to increase QOL and ease/safety with ADLs.   OBJECTIVE IMPAIRMENTS: Abnormal gait, decreased activity tolerance, decreased balance, decreased cognition, decreased coordination, decreased endurance, decreased knowledge of condition, decreased knowledge of use of DME, decreased mobility, difficulty walking, decreased ROM, decreased strength, impaired flexibility, impaired sensation, impaired UE functional use, and pain.   ACTIVITY LIMITATIONS: carrying, lifting, sitting, standing, transfers, bed mobility, continence, bathing, toileting, dressing, self feeding, reach over head, hygiene/grooming, and locomotion level  PARTICIPATION LIMITATIONS: meal prep, cleaning, laundry, shopping, and community activity  PERSONAL  FACTORS: Age, Time since onset of injury/illness/exacerbation, and 3+ comorbidities: hypothyroidism, recurrent UTIs, osteoarthritis, prior anterior trochanter fracture, CKD stage IIIb, chronic systolic heart failure, dementia, hypertension, history of cardiac pacemaker, history of normal pressure hydrocephalus, overreactive bladder are also affecting patient's functional outcome.   REHAB  POTENTIAL: Good  CLINICAL DECISION MAKING: Evolving/moderate complexity  EVALUATION COMPLEXITY: Moderate  PLAN:  PT FREQUENCY: 1-2x/week  PT DURATION: 12 weeks  PLANNED INTERVENTIONS: 97164- PT Re-evaluation, 97750- Physical Performance Testing, 97110-Therapeutic exercises, 97530- Therapeutic activity, 97112- Neuromuscular re-education, 97535- Self Care, [id number]- Manual therapy, [phone]- Gait training, [phone]- Canalith repositioning, Patient/Family education, Balance training, Stair training, Joint mobilization, Vestibular training, Visual/preceptual remediation/compensation, Cognitive remediation, DME instructions, Cryotherapy, Moist heat, and Biofeedback  PLAN FOR NEXT SESSIONS:   - continue gait training and functional B LE strengthening in // bars - hip flexor stretch - supine bridges - sit<>stands - standing reaching overhead/upright for improved hip extension and upright posture - gait training - standing marches/hip flexion for improved foot clearance     Sanaiyah Kirchhoff, PT, DPT, NCS, CSRS Physical Therapist - [facility]  2:05 PM 04/09/24

## 2024-04-11 ENCOUNTER — Encounter: Admitting: Occupational Therapy

## 2024-04-11 ENCOUNTER — Ambulatory Visit: Admitting: Physical Therapy

## 2024-04-16 ENCOUNTER — Ambulatory Visit: Admitting: Occupational Therapy

## 2024-04-16 ENCOUNTER — Ambulatory Visit: Attending: Family Medicine

## 2024-04-16 DIAGNOSIS — M6281 Muscle weakness (generalized): Secondary | ICD-10-CM

## 2024-04-16 DIAGNOSIS — R269 Unspecified abnormalities of gait and mobility: Secondary | ICD-10-CM | POA: Diagnosis present

## 2024-04-16 DIAGNOSIS — R278 Other lack of coordination: Secondary | ICD-10-CM

## 2024-04-16 DIAGNOSIS — R2681 Unsteadiness on feet: Secondary | ICD-10-CM | POA: Diagnosis present

## 2024-04-16 DIAGNOSIS — R262 Difficulty in walking, not elsewhere classified: Secondary | ICD-10-CM | POA: Diagnosis present

## 2024-04-16 DIAGNOSIS — R5381 Other malaise: Secondary | ICD-10-CM | POA: Insufficient documentation

## 2024-04-16 NOTE — Therapy (Signed)
 OUTPATIENT PHYSICAL THERAPY NEURO TREATMENT   Patient Name: Kerri [name] MRN: [id number] DOB:[birth year], 89 y.o., female Today's Date: 04/16/2024   PCP: Cyrus Selinda Moose, PA-C  REFERRING PROVIDER: Cyrus Selinda Moose, PA-C   END OF SESSION:   PT End of Session - 04/16/24 1325     Visit Number 6    Number of Visits 24    Date for PT Re-Evaluation 05/30/24    Authorization Time Period [employer] [id number] for 4 PT vistis from 5/28-6/25 (6/4 is 3 of 4)    PT Start Time 1319    PT Stop Time 1400    PT Time Calculation (min) 41 min    Equipment Utilized During Treatment Gait belt    Activity Tolerance Patient tolerated treatment well;No increased pain    Behavior During Therapy WFL for tasks assessed/performed              Past Medical History:  Diagnosis Date   Arthritis    Chronic systolic heart failure (HCC)    CKD (chronic kidney disease), stage III (HCC)    Dementia (HCC)    Hypertension    Hypothyroidism    Overactive bladder    Past Surgical History:  Procedure Laterality Date   INTRAMEDULLARY (IM) NAIL INTERTROCHANTERIC Left 09/06/2022   Procedure: INTRAMEDULLARY (IM) NAIL INTERTROCHANTERIC;  Surgeon: Tobie Priest, MD;  Location: [facility] ORS;  Service: Orthopedics;  Laterality: Left;   PACEMAKER LEADLESS INSERTION N/A 01/07/2021   Procedure: PACEMAKER LEADLESS INSERTION;  Surgeon: Ammon Blunt, MD;  Location: [facility];  Service: Cardiovascular;  Laterality: N/A;   PPM GENERATOR REMOVAL N/A 01/07/2021   Procedure: PPM GENERATOR REMOVAL;  Surgeon: Ammon Blunt, MD;  Location: [facility];  Service: Cardiovascular;  Laterality: N/A;   Patient Active Problem List   Diagnosis Date Noted   AKI (acute kidney injury) (HCC) 01/12/2024   Acute postoperative anemia due to expected blood loss 09/07/2022   Displaced intertrochanteric fracture of left femur, initial encounter for closed fracture (HCC) 09/05/2022   Orthostatic  hypotension 07/12/2022   Diarrhea 07/10/2022   COVID-19 virus infection 07/08/2022   Electrolyte abnormality 07/08/2022   Malnutrition of moderate degree 07/08/2022   Acute delirium 07/08/2022   History of recurrent UTI (urinary tract infection) 01/30/2022   Constipation    UTI (urinary tract infection) 10/15/2021   Elevated brain natriuretic peptide (BNP) level 10/15/2021   Generalized weakness 10/15/2021   Chronic kidney disease, stage 3b (HCC) 10/15/2021   Normal pressure hydrocephalus (HCC) 10/15/2021   Dementia without behavioral disturbance (HCC) 10/15/2021   Hypothyroidism 10/15/2021   Essential hypertension 10/15/2021   Overactive bladder 10/15/2021   Moderate mitral regurgitation 01/13/2021   Status post placement of cardiac pacemaker 01/13/2021   CHB (complete heart block) (HCC) 01/07/2021   Chronic systolic CHF (congestive heart failure) (HCC) 12/17/2020   Postmenopausal osteoporosis 08/26/2016   Mixed Alzheimer's and vascular dementia (HCC) 06/13/2014    ONSET DATE: Progressive decline over ~1-1.5 years  REFERRING DIAG:  M51.369 (ICD-10-CM) - Other intervertebral disc degeneration, lumbar region without mention of lumbar back pain or lower extremity pain  R53.81 (ICD-10-CM) - Other malaise  M62.81 (ICD-10-CM) - Muscle weakness (generalized)    THERAPY DIAG:  Muscle weakness (generalized)  Difficulty in walking, not elsewhere classified  Unsteadiness on feet  Rationale for Evaluation and Treatment: Rehabilitation  SUBJECTIVE:  SUBJECTIVE STATEMENT:  Pt's caregiver, [name] present for session. No updates. Pt caregiver notes pt RLE appears more swollen. PT instructs them to contact pt's physician due to reports of increased LE swelling. Pt caregiver agreeable to plan.  From  Eval: Patient previously known to this clinic. Patient with recent short hospitalization following AKI superimposed on CKI stage III with UTI. Pt received HHPT prior to returning to OPPT.  [name], caregiver, reports having taken patient to MD due to pt having decreased interest in doing things and then MD found out pt's kidney levels were low, resulting in her hospitalization.   Denies pain. Denies falls/stumbles at home.  Pt accompanied by: caregiver, [name]   PERTINENT HISTORY:  PMH: hypothyroidism, recurrent UTIs, osteoarthritis, prior anterior trochanter fracture, CKD stage IIIb, chronic systolic heart failure, dementia, hypertension, history of cardiac pacemaker, history of normal pressure hydrocephalus, overreactive bladder   Most recent A&P from Neurology note on 12/21/2023: Mixed dementia (Alzheimer's disease + vascular dementia) with significant ventriculomegaly (with negative lumbar drain trial in 2015) in a patient with history of sepsis secondary to UTI treated with trimethoprim  January 2023. Medication appears to help with UTI and improved mental status - reports good control with current medication Progressive behavioral disturbances with mood swings, outbursts, and apathy, exacerbated during the day. Seroquel (quetiapine) was initiated but caused significant lethargy. Alternative medications, lamotrigine and Depakote, were discussed for her mood-stabilizing properties and different tolerability profiles. - Discuss with primary care provider about reducing Seroquel dose to assess tolerability. Consider 1/2 tablet (12.5 mg) instead.  - If unable to tolerate Seroquel at lower dose, will consider trial of Depakote Valproic Acid is an antiepileptic drug, used for many different conditions. Patient was advised not to stop medication abruptly, not to use alcohol. Acute side-effects can be abdominal pain, N/V, diarrhea and increased ammonia level. Long term side-effects are hair loss, weight gain,  tremors and amenorrhea. Rarely, it can cause low platelets, liver failure and birth defects.  - Consider alternative medications such as lamotrigine or Depakote if Seroquel remains intolerable. - Document alternative medications in the chart for future reference.  Fatigue and somnolence Increased fatigue and somnolence potentially related to Seroquel, hypothyroidism, and medication side effects.  - Monitor heart rate and adjust medications if fatigue persists and heart rate remains low.  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 Symptoms include leg pain, particularly in the right leg, and dragging of the leg during ambulation. Physical therapy has been beneficial. Her symptoms arte chronic. She has had a reportedly negative lower extremity doppler, unavailable for my review.  Right leg pain with inability to pick up feet (right>left - likely from ventriculomegaly + peripheral neuropathy) patient with history of lumbar disc disease with stenosis - reports improvement during recent trial of prednisone. Suspicious for lumbar Disc Disease in addition to decondition Reviewed PCP note: Ongoing leg pain with intermittent weakness. On exam she does have some swelling around the right knee. Unsure if she could have some severe arthritis catching as she walks. Will obtain x-ray of the right knee today. Trial of prednisone 20 mg once a day for 5 days. We did discuss that she has some degenerative lumbar disc disease with stenosis and likely playing a role in right leg weakness. She has a pacemaker and we will defer MRI of the lumbar spine. Keep follow-up with neurology and if they feel it is necessary they could possibly pursue nerve conduction study. Inform caretaker  to help patient and stay by her side when walking.   - Patient says that her legs feel heavy and that she has difficulty initiating the  first step when walking - consistent with magnetic gait in patient with hydrocephalus and brain atrophy - Previous (-) Lumbar Puncture trial at [facility] (01/09/14). - Continue physical therapy to manage symptoms and improve mobility. - strict Emergency Room/return precautions    PAIN:  Are you having pain? No  PRECAUTIONS: Fall and ICD/Pacemaker  RED FLAGS: None and hx of spinal stenosis   WEIGHT BEARING RESTRICTIONS: No  FALLS: Has patient fallen in last 6 months? No  LIVING ENVIRONMENT: Lives with: caregiver 7 days a week. Alone for an hour or 2 a day, but only when alseep.   Lives in: House/apartment Stairs: Yes: Internal: 1 but ramp in place steps; none and External: 1 steps; none Has following equipment at home: Walker - 2 wheeled; transport chair; shower chair and grab bars   PLOF: Needs assistance with ADLs, Needs assistance with gait, Needs assistance with transfers, and Needs assistance with ambulation using RW - Primarily uses transport chair for mobility, but will ambulate with assistance to/from bathroom using RW in the home  PATIENT GOALS: Help her walking be easier and help her get stronger   OBJECTIVE:  Note: Objective measures were completed at Evaluation unless otherwise noted.  DIAGNOSTIC FINDINGS:   EXAM (from 10/28/2022): CT LUMBAR SPINE WITHOUT CONTRAST IMPRESSION: 1. Transitional anatomy. Adhering to convention from the prior lumbar spine MRI, the last fully formed disc space is labeled L5-S1. 2. Unchanged chronic compression fractures of L4 and L5. No new fracture of the lumbar spine. 3. Unchanged lumbar spondylosis notable for moderate narrowing of the right lateral recess at L1-2 and L2-3, as well as mild spinal canal stenosis at L2-3 and L3-4.  Electronically Signed   By: Ryan Chess M.D.   On: 10/28/2022 16:42   EXAM (from 09/09/2022): CT HEAD WITHOUT CONTRAST  COMPARISON: CT head 09/05/2022, 08/15/2021   IMPRESSION: 1. No acute  abnormality and no change from prior studies. 2. Atrophy and chronic microvascular ischemic change in the white matter.   Electronically Signed   By: Carlin Gaskins M.D.   On: 09/09/2022 11:50    COGNITION: Overall cognitive status: History of cognitive impairments - at baseline   SENSATION: Light touch: WFL on screen, but pt with hx of neuropathy in B LEs  COORDINATION: Not formally assessed, likely impacted by strength deficits as noted below  EDEMA:  Not formally assessed, but no significant edema noted  MUSCLE TONE: Not formally assessed   MUSCLE LENGTH: Not formally assessed   DTRs:  Not formally assessed   POSTURE: rounded shoulders, forward head, increased thoracic kyphosis, posterior pelvic tilt, and flexed trunk    LOWER EXTREMITY ROM:     Active  Right Eval Left Eval  Hip flexion Limited AROM by weakness Limited AROM by weakness (more impaired than R)  Hip extension Decreased ROM Decreased ROM  Hip abduction    Hip adduction    Hip internal rotation    Hip external rotation    Knee flexion [facility]  Knee extension [facility]  Ankle dorsiflexion [facility]  Ankle plantarflexion [facility] WFL  Ankle inversion    Ankle eversion     (Blank rows = not tested)  LOWER EXTREMITY MMT:    MMT Right Eval Left Eval  Hip flexion 3+ 3-  Hip extension    Hip abduction  Hip adduction    Hip internal rotation    Hip external rotation    Knee flexion 4- 4-  Knee extension 4- 4-  Ankle dorsiflexion 4- 3-  Ankle plantarflexion 3+ 4-  Ankle inversion    Ankle eversion    (Blank rows = not tested)  Manual Muscle Test Scale 0/5 = No muscle contraction can be seen or felt 1/5 = Contraction can be felt, but there is no motion 2-/5 = Part moves through incomplete ROM w/ gravity decreased 2/5 = Part moves through complete ROM w/ gravity decreased 2+/5 = Part moves through incomplete ROM (<50%) against gravity or through complete ROM w/ gravity 3-/5 = Part moves through  incomplete ROM (>50%) against gravity 3/5 = Part moves through complete ROM against gravity 3+/5 = Part moves through complete ROM against gravity/slight resistance 4-/5= Holds test position against slight to moderate pressure 4/5 = Part moves through complete ROM against gravity/moderate resistance 4+/5= Holds test position against moderate to strong pressure 5/5 = Part moves through complete ROM against gravity/full resistance  BED MOBILITY:  Findings: Sit to supine Min A for B LE management onto mat Supine to sit Min A for trunk upright with continued impaired motor plan Pt doesn't recall previously trained logroll technique to increase her independence, could be due to impaired carryover of technique in the clinic space Caregiver reports bed mobility has improved slightly at home  Pt with significant difficulty performing anterior scooting towards EOM requiring mod A for this   TRANSFERS: Sit to stand: Min A  Assistive device utilized: Environmental consultant - 2 wheeled     Stand to sit: Min A  Assistive device utilized: Environmental consultant - 2 wheeled     Chair to chair: Min A  Assistive device utilized: Environmental consultant - 2 wheeled      *continues to require cuing to turn fully prior to initiating sitting   RAMP:  Not tested  CURB:  Not tested  STAIRS: Not tested GAIT: Findings: Gait Characteristics: step to pattern, step through pattern, decreased step length- Right, decreased step length- Left, decreased stride length, decreased hip/knee flexion- Right, decreased hip/knee flexion- Left, decreased ankle dorsiflexion- Right, decreased ankle dorsiflexion- Left, trunk flexed, narrow BOS, poor foot clearance- Right, and poor foot clearance- Left, Distance walked: ~28ft x2, Assistive device utilized:Walker - 2 wheeled, Level of assistance: Min A, and Comments: severely decreased gait speed  FUNCTIONAL TESTS:  5 times sit to stand: 83 seconds Timed up and go (TUG):  43min54 seconds using RW with min A 10 meter walk  test: needs to be assessed  PATIENT SURVEYS:  PsFS: average: 3.67  Patient will completely turn 90degrees prior to sitting down during transfers with minimal cuing: 3  Patient will be able to maintain standing balance using B UE support on RW with only SBA while aide provides total A for LB clothing or ADLs: 5 Patient will stand with erect/upright posture when walking: 3  TREATMENT DATE: 04/16/2024   Pt arrives to therapy in her personal transport chair. Caregiver present  TE & TA: Min a for stand pivot transfer from transport chair>RW>standard chair  Seated march 2x10 each LE, very challenging, with use of TC and cuing for upright posture throughout. Pt struggles to maintain upright posture due to fatigue, exhibiting weakness of trunk mm.  Seated adductor squeeze with pb.all 3x10 bilat LE - TC/VC for upright in chair   STS 3x3 cuing for technique/push off arm-rests - second PT assists in session to provide min-mod a. Pt with intermittent carryover following cues for technique  Seated hamstring curl with red t.band 2x12 each LE   Seated hip abduction with red t.band 2x12 each LE with TC/ VC  Seated heel-toe raises x multiple reps of each, most difficulty with heel raises      PATIENT EDUCATION: Education details: Pt educated throughout session about proper posture and technique with exercises. Improved exercise technique, movement at target joints, use of target muscles after min to mod verbal, visual, tactile cues.  Person educated: Patient and Caregiver [name] Education method: Explanation Education comprehension: verbalized understanding and needs further education  HOME EXERCISE PROGRAM:  Access Code: [email or web address] URL: [email or web address] Date: 12/20/2023 Prepared by: Connell Kiss   Exercises - Sit to Stand with Armchair  - 1 x daily - 7  x weekly - 3 sets - 5 reps - Standing March with Counter Support  - 1 x daily - 7 x weekly - 2 sets - 10 reps - Supine Bridge  - 1 x daily - 7 x weekly - 2 sets - 10 reps - Supine Heel Slide with Strap  - 1 x daily - 7 x weekly - 2 sets - 10 reps - Supine Hip Abduction AROM  - 1 x daily - 7 x weekly - 3 sets - 10 reps - Seated Long Arc Quad  - 1 x daily - 7 x weekly - 3 sets - 10 reps - Seated March  - 1 x daily - 7 x weekly - 3 sets - 10 reps - 2 hold - Seated Hip Abduction with Resistance  - 1 x daily - 7 x weekly - 2 sets - 10 reps   GOALS: Goals reviewed with patient? Yes  SHORT TERM GOALS: Target date: 04/18/2024  Patient will be independent in home exercise program to improve strength/mobility for better functional independence with ADLs.  Baseline: need to initiate Goal status: INITIAL   LONG TERM GOALS: Target date: 05/30/2024  Patient will perform bed mobility with consistently no more than min A utilizing bed features/supports as needed and with no more than than min cuing from aide/caregiver.  Baseline: requires up to mod A and mod/max verbal cuing Goal status: INITIAL  2.  Patient will increase PsFS score to equal to or greater than and average of 2 points to demonstrate statistically significant improvement in mobility and quality of life.  Baseline: 3.667 Goal status: INITIAL  3.  Patient (> 89 years old) will complete five times sit to stand test in < 30 seconds indicating an increased LE strength and improved balance.  Baseline: 83 seconds from her transport chair with RW in front of her and min progressed to light mod A  Goal status: INITIAL   4.  Patient will reduce timed up and go to <60 sec seconds to reduce fall risk and demonstrate improved transfer/gait ability.  Baseline: 56min54 seconds using RW with min A  Goal status: INITIAL  5.  Patient will increase 10 meter walk test by 0.68m/s as to improve gait speed for better household level ambulation and to reduce  fall risk.  Baseline: 0.050m/s with RW  Goal status: INITIAL   ASSESSMENT:  CLINICAL IMPRESSION:  Focus of session primarily on addressing BLE strength deficits & instruction in safe STS technique. Pt noted to have difficulty maintaining upright posture in seated due to quick onset of fatigue of trunk mm. She also showed intermittent ability to correct to safer STS technique during visit following cues, but still will require further reinforcement. Ms. Florence will benefit from further skilled PT to improve these deficits in order to increase QOL and ease/safety with ADLs.   OBJECTIVE IMPAIRMENTS: Abnormal gait, decreased activity tolerance, decreased balance, decreased cognition, decreased coordination, decreased endurance, decreased knowledge of condition, decreased knowledge of use of DME, decreased mobility, difficulty walking, decreased ROM, decreased strength, impaired flexibility, impaired sensation, impaired UE functional use, and pain.   ACTIVITY LIMITATIONS: carrying, lifting, sitting, standing, transfers, bed mobility, continence, bathing, toileting, dressing, self feeding, reach over head, hygiene/grooming, and locomotion level  PARTICIPATION LIMITATIONS: meal prep, cleaning, laundry, shopping, and community activity  PERSONAL FACTORS: Age, Time since onset of injury/illness/exacerbation, and 3+ comorbidities: hypothyroidism, recurrent UTIs, osteoarthritis, prior anterior trochanter fracture, CKD stage IIIb, chronic systolic heart failure, dementia, hypertension, history of cardiac pacemaker, history of normal pressure hydrocephalus, overreactive bladder are also affecting patient's functional outcome.   REHAB POTENTIAL: Good  CLINICAL DECISION MAKING: Evolving/moderate complexity  EVALUATION COMPLEXITY: Moderate  PLAN:  PT FREQUENCY: 1-2x/week  PT DURATION: 12 weeks  PLANNED INTERVENTIONS: 97164- PT Re-evaluation, 97750- Physical Performance Testing, 97110-Therapeutic  exercises, 97530- Therapeutic activity, 97112- Neuromuscular re-education, 97535- Self Care, [id number]- Manual therapy, [phone]- Gait training, [phone]- Canalith repositioning, Patient/Family education, Balance training, Stair training, Joint mobilization, Vestibular training, Visual/preceptual remediation/compensation, Cognitive remediation, DME instructions, Cryotherapy, Moist heat, and Biofeedback  PLAN FOR NEXT SESSIONS:   - continue gait training and functional B LE strengthening in // bars - hip flexor stretch - supine bridges - sit<>stands - standing reaching overhead/upright for improved hip extension and upright posture - gait training - standing marches/hip flexion for improved foot clearance     Darryle Patten PT, DPT  Physical Therapist - [facility]  [facility]  5:12 PM 04/16/24

## 2024-04-16 NOTE — Therapy (Addendum)
 OUTPATIENT OCCUPATIONAL THERAPY NEURO TREATMENT NOTE  Patient Name: Kerri [name] MRN: [id number] DOB:[birth year], 89 y.o., female Today's Date: 04/16/2024  PCP: Selinda Cyrus Moose, PA-C REFERRING PROVIDER: Maree Hila, MARLA, MD  END OF SESSION:  OT End of Session - 04/16/24 1601     Visit Number 9    Number of Visits 18    Date for OT Re-Evaluation 05/30/24    OT Start Time 1400    OT Stop Time 1445    OT Time Calculation (min) 45 min    Activity Tolerance Patient tolerated treatment well    Behavior During Therapy WFL for tasks assessed/performed          Past Medical History:  Diagnosis Date   Arthritis    Chronic systolic heart failure (HCC)    CKD (chronic kidney disease), stage III (HCC)    Dementia (HCC)    Hypertension    Hypothyroidism    Overactive bladder    Past Surgical History:  Procedure Laterality Date   INTRAMEDULLARY (IM) NAIL INTERTROCHANTERIC Left 09/06/2022   Procedure: INTRAMEDULLARY (IM) NAIL INTERTROCHANTERIC;  Surgeon: Tobie Priest, MD;  Location: [facility];  Service: Orthopedics;  Laterality: Left;   PACEMAKER LEADLESS INSERTION N/A 01/07/2021   Procedure: PACEMAKER LEADLESS INSERTION;  Surgeon: Ammon Blunt, MD;  Location: [facility];  Service: Cardiovascular;  Laterality: N/A;   PPM GENERATOR REMOVAL N/A 01/07/2021   Procedure: PPM GENERATOR REMOVAL;  Surgeon: Ammon Blunt, MD;  Location: [facility];  Service: Cardiovascular;  Laterality: N/A;   Patient Active Problem List   Diagnosis Date Noted   AKI (acute kidney injury) (HCC) 01/12/2024   Acute postoperative anemia due to expected blood loss 09/07/2022   Displaced intertrochanteric fracture of left femur, initial encounter for closed fracture (HCC) 09/05/2022   Orthostatic hypotension 07/12/2022   Diarrhea 07/10/2022   COVID-19 virus infection 07/08/2022   Electrolyte abnormality 07/08/2022   Malnutrition of moderate degree 07/08/2022   Acute  delirium 07/08/2022   History of recurrent UTI (urinary tract infection) 01/30/2022   Constipation    UTI (urinary tract infection) 10/15/2021   Elevated brain natriuretic peptide (BNP) level 10/15/2021   Generalized weakness 10/15/2021   Chronic kidney disease, stage 3b (HCC) 10/15/2021   Normal pressure hydrocephalus (HCC) 10/15/2021   Dementia without behavioral disturbance (HCC) 10/15/2021   Hypothyroidism 10/15/2021   Essential hypertension 10/15/2021   Overactive bladder 10/15/2021   Moderate mitral regurgitation 01/13/2021   Status post placement of cardiac pacemaker 01/13/2021   CHB (complete heart block) (HCC) 01/07/2021   Chronic systolic CHF (congestive heart failure) (HCC) 12/17/2020   Postmenopausal osteoporosis 08/26/2016   Mixed Alzheimer's and vascular dementia (HCC) 06/13/2014   ONSET DATE: 09/05/2022  REFERRING DIAG: Intertrochanteric Left Femur Fracture, Lumbar Disc Disease with stenosis  THERAPY DIAG:  Other lack of coordination  Muscle weakness (generalized)  Rationale for Evaluation and Treatment: Rehabilitation  SUBJECTIVE:  SUBJECTIVE STATEMENT: Pt caregiver reports that Pt. Still continues to have a difficult time to initiate tasks using her L hand.  Pt accompanied by: [name]-Personal Caregiver  PERTINENT HISTORY:  03/07/24:  Pt returns today to resume therapy services after recent hospitalization for AKI.   Per chart: 89 with past medical history significant for hypothyroidism, recurrent UTIs, osteoarthritis, prior anterior trochanter fracture, CKD stage IIIb, chronic systolic heart failure, dementia, hypertension, history of cardiac pacemaker, history of normal pressure hydrocephalus, overreactive bladder. Patient was previously on suppressive Bactrim . She has been receiving workup outpatient with neurology. Neurology requested  for urinalysis but patient was unable to provide a voided specimen. She went to urology for catheter catheterization UA. At  urology office reported feeling weak. Outpatient labs revealed creatinine 2.9. She was sent to the ED for further evaluation of AKI.   Pt. Is a 89 y.o. female who sustained an Intertrochanteric Left Femur Fracture, Lumbar Disc Disease with Stenosis, Chronic compression Fractures. 09/05/2022, PMHx includes: Mixed Dementia (Alzheimer's Disease, and Vascular Dementia), Pacemaker, Mild spinal canal stenosis L2-3 & L3-4, Hx of UTI.  PRECAUTIONS: Pacemaker (6-8 years)  WEIGHT BEARING RESTRICTIONS: No  PAIN:  Are you having pain? Yes, in the a.m. R leg, back   FALLS: Has patient fallen in last 6 months? No, 3 assisted sitdowns to the flloor- When Pt. feels like she is not able to walk any further.  LIVING ENVIRONMENT: Lives with: [name] Lives in: House Stairs:  One story with a basement; 2 steps to enter from the garage. In step down to the den; and and one step ramp into the kitchen Has following equipment at home: Wheelchair (manual), walker, hospital bed with rails, and a lift chair, BCommode, and shower chair. Gait belt, Life Alert, hand held shower head.  PLOF: Independent  PATIENT GOALS:  To be able to stand, and walk to the bathroom. Left hand -Pt. Is not using her left hand much  OBJECTIVE:  Note: Objective measures were completed at Evaluation unless otherwise noted.  HAND DOMINANCE: Right  ADLs: Now, occasional min A, CGA , eating no change, set up for grooming, set up for UB dressing,same for dressing, CGA toilet transfer, bathing same, mod A to lift legs over tub while seated on chair, before hip fx, occasional help with transfers at night or if tired.  Transfers/ambulation related to ADLs:  Eating: Independent, assist with set-up for cutting food Grooming: Independent set-up, seated  UB Dressing: Independent with set-up LB Dressing: Mod/MaxA pants, and socks Toileting: Assist with toilet transfers, clothing negotiation, Independent toilet hygiene. Some incontinence  episodes Bathing: MaxA Tub Shower transfers: Max A shower transfers  IADLs: Shopping: Dependent. Will accompany at times. Light housekeeping: Occasionally wipes the table, and puts items in the trash, folds laundry when brought sitting Meal Prep: Will help assist with baking/mixing items, no cooking, meals provided for the Pt. Community mobility: Relies on family and friends Medication management: Medication management provided for the Pt. Financial management:  No change from baseline  MOBILITY STATUS: Hx of Falls, Needs assist   FUNCTIONAL OUTCOME MEASURES: TBD  UPPER EXTREMITY ROM:    Active ROM Right eval Right 03/07/24 Left eval Left 03/07/24  Shoulder flexion 119(140) 130 (143) 112(130) 117 (130)  Shoulder abduction 112 160 (170) 98 135 (155)  Shoulder adduction      Shoulder extension      Shoulder internal rotation      Shoulder external rotation      Elbow flexion WNL  WNL   Elbow extension WNL  WNL   Wrist flexion      Wrist extension WNL  WNL   Wrist ulnar deviation      Wrist radial deviation      Wrist pronation      Wrist supination      (Blank rows = not tested)  UPPER EXTREMITY MMT:     MMT Right eval Right 03/07/24 Left eval Left 03/07/24  Shoulder flexion 3-/5 4+/5 within available range 3-/5 3+ within available range  Shoulder abduction 3-/5 4+/5 within available range 3-/5 3+ within available range  Shoulder adduction      Shoulder extension      Shoulder internal rotation      Shoulder external rotation      Middle trapezius      Lower trapezius      Elbow flexion 4/5 4+ 3+/5 4+  Elbow extension 4/5 4+ 3+/5 4+  Wrist flexion    4+  Wrist extension 4/5 4+ 3+/5 4+  Wrist ulnar deviation      Wrist radial deviation      Wrist pronation      Wrist supination      (Blank rows = not tested)  HAND FUNCTION: Grip strength: Right: 29 lbs; Left: 21 lbs; Pinch strength: Lateral: R: 10#, L: 6#, 3pt. Pinch: R: 8#, L: 5# 03/07/24: Grip strength:  Right: 45 lbs; Left: 27 lbs; Pinch strength: Lateral: R: 10 lbs, L: 7 lbs, 3 point pinch: Right: 6 lbs, L: 5#  COORDINATION:  9 Hole Peg test: Right: 1 min. & 2  sec; Left: 2 min. & 1 sec 03/07/24: 9 hole peg test: Right: 46 sec, Left: 1 min 24 sec   SENSATION: Light touch: Inconsistent  COGNITION: Overall cognitive status: History of cognitive impairments - at baseline Alzheimer's Dementia, and Vascular Dementia  VISION: Subjective report:  Glasses all the time   VISION ASSESSMENT: Wears glasses all the time at baseline                                                                                                            TREATMENT DATE: 04/16/24 Therapeutic Activity: -Pt. Performed 2 trials of BUE FMC grasping for 1 resistive cubes, removing resistive cubes from board at table top surface using the thumb and 2nd digits, promoting sustained grasp in wrist extension, while crossing midline to place cubes beside the opposite side of board. -To further challenge the task, Pt. Grasped 1 resistive cubes, alternating Left and Right hand to place resistive cubes onto board at vertical angle to promote BUE thumb and 2nd/3rd digit flexion/extension, wrist and elbow extension in functional reaching. -Emphasis was placed on initiating with the LUE.   -Facilitated BUE functional reaching and FMC tasks removing resistive washers from magnetic dish to place onto horizontal dowel in multiple planes.    -Pt. Performed BUE [facility] skills grasping 1 1/2 circular discs to place into slotted target, positioned in multiple planes to promote thumb/2nd/3rd digit flexion/extension, shoulder flexion, wrist extension, and crossing midline.  -Pt. Placed 1/2 cubes into container in multiple planes, alternating R and L hand to place into container, promoting crossing midline while sustaining 2 pt. and 3 pt. Pinch.   Neuromuscular re-education:  -Pt. Performed BUE FMC skills pinching, grasping, and storing 5  [place] flat circular stones at a time in preparation for releasing 1 at a time into shallow dishes from the ulnar aspect of the hand, while crossing midline.      PATIENT EDUCATION: Education details: [facility] HEP Person educated: Patient and Caregiver [name] Education method: Explanation, demo, vc/tactile cues Education comprehension: verbalized understanding, demonstrated understanding  HOME  EXERCISE PROGRAM: -Yellow theraputty with visual handout+ -[facility] handout  GOALS: Goals reviewed with patient? Yes  SHORT TERM GOALS: Target date: 04/18/2024    Pt. Will require Supervision for BUE HEPs Baseline: Eval: No current HEP; 03/07/24: Will re-establish HEP d/t therapy interrupted with recent hospitalization Goal status: ongoing   LONG TERM GOALS: Target date: 05/30/2024  Pt. Will increase L shoulder strength by 1 mm grade to assist with ADLs/IADLs (revised on 03/07/24). Baseline: Eval: Right: shoulder flexion 3-/5, abduction 3-/5, elbow flexion 4/5, extension 4/5, wrist extension 4/5; Left: shoulder flexion: 3-/5, abduction: 3-/5, elbow flexion 3+/5, extension 3+/5, wrist extension 3+/5; 03/07/24: L shoulder flex/abd 3+/5 within available range Goal status: ongoing  2.  Pt will increase L grip strength by 5 or more lbs to assist with hiking pants (revised on 03/07/24). Baseline: R: 29#, L: 21#; 03/07/24: L grip 27# Goal status: INITIAL  3.  Pt. Will initiate engaging the left hand during daily activity 75% of the time with minimal cuing Baseline: Eval: Limited initiation and engagement of the LUE during daily tasks.  10-15% engaging; 03/07/24: same as eval Goal status: ongoing  4.  Pt. Will demonstrate compensatory adaptive techniques to assist with ADLs/IADLs. Baseline: Eval: Education to be provided; 03/07/24: ongoing Goal status: ongoing  ASSESSMENT:  CLINICAL IMPRESSION: Pt. Tolerated treatment well today. Pt required MOD verbal and tactile cues to engage the L hand throughout [facility] and  functional reaching tasks. Pt. Has difficulty with formulating translatory movements using the L hand moving the objects through the hand. Pt. Engages the thumb to move the objects through the hand stopping at the MP joints of the 3rd and 4th digits, then discarding them from there.  Pt. Has difficulty moving them to the tip of the left 2nd digit, and thumb in preparation for discarding them into the shallow dishes.  Pt. has improved with initiating and incorporating the left hand in tasks during treatment session. Pt. continues to benefit from OT services to work on improving LUE functioning in order to improve, and maximize participation with ADLs, and IADL tasks.  PERFORMANCE DEFICITS: in functional skills including ADLs, IADLs, coordination, dexterity, proprioception, ROM, strength, Fine motor control, Gross motor control, mobility, decreased knowledge of use of DME, and UE functional use, cognitive skills including attention, problem solving, and safety awareness, and psychosocial skills including environmental adaptation and routines and behaviors.   IMPAIRMENTS: are limiting patient from ADLs, IADLs, education, and leisure.   CO-MORBIDITIES: has  other co-morbidities that affects occupational performance. Patient will benefit from skilled OT to address above impairments and improve overall function.  MODIFICATION OR ASSISTANCE TO COMPLETE EVALUATION: Min-Moderate modification of tasks or assist with assess necessary to complete an evaluation.  OT OCCUPATIONAL PROFILE AND HISTORY: Detailed assessment: Review of records and additional review of physical, cognitive, psychosocial history related to current functional performance.  CLINICAL DECISION MAKING: Moderate - several treatment options, min-mod task modification necessary  REHAB POTENTIAL: Good  EVALUATION COMPLEXITY: Moderate    PLAN:  OT FREQUENCY: 1x/week  OT DURATION: 12 weeks  PLANNED INTERVENTIONS: 97168 OT Re-evaluation,  97535 self care/ADL training, 02889 therapeutic exercise, 97530 therapeutic activity, 97112 neuromuscular re-education, 97140 manual therapy, 97018 paraffin, 02989 moist heat, 97034 contrast bath, passive range of motion, functional mobility training, patient/family education, and DME and/or AE instructions  RECOMMENDED OTHER SERVICES: Pt currently working with PT  CONSULTED AND AGREED WITH PLAN OF CARE: Patient and family member/caregiver  PLAN FOR NEXT SESSION: see above  Damien Nap, OTS  This entire session was performed under direct supervision and direction of a licensed therapist/therapist assistant . I have personally read, edited and approve of the note as written.  Richardson Otter, MS, OTR/L   04/16/2024, 4:03 PM

## 2024-04-18 ENCOUNTER — Ambulatory Visit: Admitting: Physical Therapy

## 2024-04-18 ENCOUNTER — Encounter

## 2024-04-23 ENCOUNTER — Ambulatory Visit: Admitting: Physical Therapy

## 2024-04-23 ENCOUNTER — Ambulatory Visit

## 2024-04-23 DIAGNOSIS — R5381 Other malaise: Secondary | ICD-10-CM

## 2024-04-23 DIAGNOSIS — M6281 Muscle weakness (generalized): Secondary | ICD-10-CM

## 2024-04-23 DIAGNOSIS — R278 Other lack of coordination: Secondary | ICD-10-CM

## 2024-04-23 DIAGNOSIS — R269 Unspecified abnormalities of gait and mobility: Secondary | ICD-10-CM

## 2024-04-23 DIAGNOSIS — R2681 Unsteadiness on feet: Secondary | ICD-10-CM

## 2024-04-23 DIAGNOSIS — R262 Difficulty in walking, not elsewhere classified: Secondary | ICD-10-CM

## 2024-04-23 NOTE — Therapy (Unsigned)
 OUTPATIENT OCCUPATIONAL THERAPY NEURO TREATMENT NOTE  Patient Name: Kerri [name] MRN: [id number] DOB:1931-04-27, 89 y.o., female Today's Date: 04/23/2024  PCP: Selinda Cyrus Moose, PA-C REFERRING PROVIDER: Maree Hila, MARLA, MD  END OF SESSION:    Past Medical History:  Diagnosis Date   Arthritis    Chronic systolic heart failure (HCC)    CKD (chronic kidney disease), stage III (HCC)    Dementia (HCC)    Hypertension    Hypothyroidism    Overactive bladder    Past Surgical History:  Procedure Laterality Date   INTRAMEDULLARY (IM) NAIL INTERTROCHANTERIC Left 09/06/2022   Procedure: INTRAMEDULLARY (IM) NAIL INTERTROCHANTERIC;  Surgeon: Tobie Priest, MD;  Location: [facility];  Service: Orthopedics;  Laterality: Left;   PACEMAKER LEADLESS INSERTION N/A 01/07/2021   Procedure: PACEMAKER LEADLESS INSERTION;  Surgeon: Ammon Blunt, MD;  Location: [facility];  Service: Cardiovascular;  Laterality: N/A;   PPM GENERATOR REMOVAL N/A 01/07/2021   Procedure: PPM GENERATOR REMOVAL;  Surgeon: Ammon Blunt, MD;  Location: [facility];  Service: Cardiovascular;  Laterality: N/A;   Patient Active Problem List   Diagnosis Date Noted   AKI (acute kidney injury) (HCC) 01/12/2024   Acute postoperative anemia due to expected blood loss 09/07/2022   Displaced intertrochanteric fracture of left femur, initial encounter for closed fracture (HCC) 09/05/2022   Orthostatic hypotension 07/12/2022   Diarrhea 07/10/2022   COVID-19 virus infection 07/08/2022   Electrolyte abnormality 07/08/2022   Malnutrition of moderate degree 07/08/2022   Acute delirium 07/08/2022   History of recurrent UTI (urinary tract infection) 01/30/2022   Constipation    UTI (urinary tract infection) 10/15/2021   Elevated brain natriuretic peptide (BNP) level 10/15/2021   Generalized weakness 10/15/2021   Chronic kidney disease, stage 3b (HCC) 10/15/2021   Normal pressure hydrocephalus  (HCC) 10/15/2021   Dementia without behavioral disturbance (HCC) 10/15/2021   Hypothyroidism 10/15/2021   Essential hypertension 10/15/2021   Overactive bladder 10/15/2021   Moderate mitral regurgitation 01/13/2021   Status post placement of cardiac pacemaker 01/13/2021   CHB (complete heart block) (HCC) 01/07/2021   Chronic systolic CHF (congestive heart failure) (HCC) 12/17/2020   Postmenopausal osteoporosis 08/26/2016   Mixed Alzheimer's and vascular dementia (HCC) 06/13/2014   ONSET DATE: 09/05/2022  REFERRING DIAG: Intertrochanteric Left Femur Fracture, Lumbar Disc Disease with stenosis  THERAPY DIAG:  No diagnosis found.  Rationale for Evaluation and Treatment: Rehabilitation  SUBJECTIVE:  SUBJECTIVE STATEMENT: Pt caregiver reports that Pt. Still continues to have a difficult time to initiate tasks using her L hand.  Pt accompanied by: [name]-Personal Caregiver  PERTINENT HISTORY:  03/07/24:  Pt returns today to resume therapy services after recent hospitalization for AKI.   Per chart: 89 with past medical history significant for hypothyroidism, recurrent UTIs, osteoarthritis, prior anterior trochanter fracture, CKD stage IIIb, chronic systolic heart failure, dementia, hypertension, history of cardiac pacemaker, history of normal pressure hydrocephalus, overreactive bladder. Patient was previously on suppressive Bactrim . She has been receiving workup outpatient with neurology. Neurology requested for urinalysis but patient was unable to provide a voided specimen. She went to urology for catheter catheterization UA. At urology office reported feeling weak. Outpatient labs revealed creatinine 2.9. She was sent to the ED for further evaluation of AKI.   Pt. Is a 89 y.o. female who sustained an Intertrochanteric Left Femur Fracture, Lumbar Disc Disease with Stenosis, Chronic compression Fractures. 09/05/2022, PMHx includes: Mixed Dementia (Alzheimer's Disease, and Vascular Dementia),  Pacemaker, Mild spinal canal stenosis L2-3 & L3-4,  Hx of UTI.  PRECAUTIONS: Pacemaker (6-8 years)  WEIGHT BEARING RESTRICTIONS: No  PAIN: 04/23/24: Caregiver estimates really bad pain in the morning since starting therapy only 1x per week. Are you having pain? Yes, in the a.m. R leg, back   FALLS: Has patient fallen in last 6 months? No, 3 assisted sitdowns to the flloor- When Pt. feels like she is not able to walk any further.  LIVING ENVIRONMENT: Lives with: [name] Lives in: House Stairs:  One story with a basement; 2 steps to enter from the garage. In step down to the den; and and one step ramp into the kitchen Has following equipment at home: Wheelchair (manual), walker, hospital bed with rails, and a lift chair, BCommode, and shower chair. Gait belt, Life Alert, hand held shower head.  PLOF: Independent  PATIENT GOALS:  To be able to stand, and walk to the bathroom. Left hand -Pt. Is not using her left hand much  OBJECTIVE:  Note: Objective measures were completed at Evaluation unless otherwise noted.  HAND DOMINANCE: Right  ADLs: Now, occasional min A, CGA , eating no change, set up for grooming, set up for UB dressing,same for dressing, CGA toilet transfer, bathing same, mod A to lift legs over tub while seated on chair, before hip fx, occasional help with transfers at night or if tired.  Transfers/ambulation related to ADLs:  Eating: Independent, assist with set-up for cutting food Grooming: Independent set-up, seated  UB Dressing: Independent with set-up LB Dressing: Mod/MaxA pants, and socks Toileting: Assist with toilet transfers, clothing negotiation, Independent toilet hygiene. Some incontinence episodes Bathing: MaxA Tub Shower transfers: Max A shower transfers  IADLs: Shopping: Dependent. Will accompany at times. Light housekeeping: Occasionally wipes the table, and puts items in the trash, folds laundry when brought sitting Meal Prep: Will help assist with  baking/mixing items, no cooking, meals provided for the Pt. Community mobility: Relies on family and friends Medication management: Medication management provided for the Pt. Financial management:  No change from baseline  MOBILITY STATUS: Hx of Falls, Needs assist   FUNCTIONAL OUTCOME MEASURES: TBD  UPPER EXTREMITY ROM:    Active ROM Right eval Right 03/07/24 Left eval Left 03/07/24  Shoulder flexion 119(140) 130 (143) 112(130) 117 (130)  Shoulder abduction 112 160 (170) 98 135 (155)  Shoulder adduction      Shoulder extension      Shoulder internal rotation      Shoulder external rotation      Elbow flexion WNL  WNL   Elbow extension WNL  WNL   Wrist flexion      Wrist extension WNL  WNL   Wrist ulnar deviation      Wrist radial deviation      Wrist pronation      Wrist supination      (Blank rows = not tested)  UPPER EXTREMITY MMT:     MMT Right eval Right 03/07/24 Right 04/23/24 Left eval Left 03/07/24 Left 04/23/24  Shoulder flexion 3-/5 4+/5 within available range 4+ 3-/5 3+ within available range 4-  Shoulder abduction 3-/5 4+/5 within available range 4+ 3-/5 3+ within available range 4-  Shoulder adduction        Shoulder extension        Shoulder internal rotation        Shoulder external rotation        Middle trapezius        Lower trapezius        Elbow flexion 4/5 4+  4+ 3+/5 4+ 4+  Elbow extension 4/5 4+ 4+ 3+/5 4+ 4+  Wrist flexion     4+ 4+  Wrist extension 4/5 4+ 4+ 3+/5 4+ 4+  Wrist ulnar deviation        Wrist radial deviation        Wrist pronation        Wrist supination        (Blank rows = not tested)  HAND FUNCTION: Grip strength: Right: 29 lbs; Left: 21 lbs; Pinch strength: Lateral: R: 10#, L: 6#, 3pt. Pinch: R: 8#, L: 5# 03/07/24: Grip strength: Right: 45 lbs; Left: 27 lbs; Pinch strength: Lateral: R: 10 lbs, L: 7 lbs, 3 point pinch: Right: 6 lbs, L: 5# 04/24/23: Grip strength: 43 lbs, Left: 25 lbs; Pinch strength: Lateral: R: 11 lbs,  L: 11 lbs; 3 point pinch: Right: 9 lbs, L: 6 lbs  COORDINATION:  9 Hole Peg test: Right: 1 min. & 2  sec; Left: 2 min. & 1 sec 03/07/24: 9 hole peg test: Right: 46 sec, Left: 1 min 24 sec  04/23/24: 9 hole peg test: Right: 46 sec, Left: 58 sec   SENSATION: Light touch: Inconsistent  COGNITION: Overall cognitive status: History of cognitive impairments - at baseline Alzheimer's Dementia, and Vascular Dementia  VISION: Subjective report:  Glasses all the time   VISION ASSESSMENT: Wears glasses all the time at baseline                                                                                                            TREATMENT DATE: 04/16/24 Therapeutic Activity: -Pt. Performed 2 trials of BUE FMC grasping for 1 resistive cubes, removing resistive cubes from board at table top surface using the thumb and 2nd digits, promoting sustained grasp in wrist extension, while crossing midline to place cubes beside the opposite side of board. -To further challenge the task, Pt. Grasped 1 resistive cubes, alternating Left and Right hand to place resistive cubes onto board at vertical angle to promote BUE thumb and 2nd/3rd digit flexion/extension, wrist and elbow extension in functional reaching. -Emphasis was placed on initiating with the LUE.   -Facilitated BUE functional reaching and FMC tasks removing resistive washers from magnetic dish to place onto horizontal dowel in multiple planes.    -Pt. Performed BUE [facility] skills grasping 1 1/2 circular discs to place into slotted target, positioned in multiple planes to promote thumb/2nd/3rd digit flexion/extension, shoulder flexion, wrist extension, and crossing midline.  -Pt. Placed 1/2 cubes into container in multiple planes, alternating R and L hand to place into container, promoting crossing midline while sustaining 2 pt. and 3 pt. Pinch.   Neuromuscular re-education:  -Pt. Performed BUE FMC skills pinching, grasping, and storing 5 [place]  flat circular stones at a time in preparation for releasing 1 at a time into shallow dishes from the ulnar aspect of the hand, while crossing midline.      PATIENT EDUCATION: Education details: [facility] HEP Person educated: Patient and Caregiver [name] Education method: Explanation, demo, vc/tactile cues Education comprehension:  verbalized understanding, demonstrated understanding  HOME EXERCISE PROGRAM: -Yellow theraputty with visual handout+ -[facility] handout  GOALS: Goals reviewed with patient? Yes  SHORT TERM GOALS: Target date: 04/18/2024    Pt. Will require Supervision for BUE HEPs Baseline: Eval: No current HEP; 03/07/24: Will re-establish HEP d/t therapy interrupted with recent hospitalization Goal status: ongoing   LONG TERM GOALS: Target date: 05/30/2024  Pt. Will increase L shoulder strength by 1 mm grade to assist with ADLs/IADLs (revised on 03/07/24). Baseline: Eval: Right: shoulder flexion 3-/5, abduction 3-/5, elbow flexion 4/5, extension 4/5, wrist extension 4/5; Left: shoulder flexion: 3-/5, abduction: 3-/5, elbow flexion 3+/5, extension 3+/5, wrist extension 3+/5; 03/07/24: L shoulder flex/abd 3+/5 within available range Goal status: ongoing  2.  Pt will increase L grip strength by 5 or more lbs to assist with hiking pants (revised on 03/07/24). Baseline: R: 29#, L: 21#; 03/07/24: L grip 27# Goal status: INITIAL  3.  Pt. Will initiate engaging the left hand during daily activity 75% of the time with minimal cuing Baseline: Eval: Limited initiation and engagement of the LUE during daily tasks.  10-15% engaging; 03/07/24: same as eval; 04/23/24: Caregiver estimates that pt uses her hand a little more, maybe 20% of the time.  Caregiver reports that she sees pt holding a napkin when she eats.  Goal status: ongoing  4.  Pt. Will demonstrate compensatory adaptive techniques to assist with ADLs/IADLs. Baseline: Eval: Education to be provided; 03/07/24: ongoing Goal status:  ongoing  ASSESSMENT:  CLINICAL IMPRESSION: Pt. Tolerated treatment well today. Pt required MOD verbal and tactile cues to engage the L hand throughout [facility] and functional reaching tasks. Pt. Has difficulty with formulating translatory movements using the L hand moving the objects through the hand. Pt. Engages the thumb to move the objects through the hand stopping at the MP joints of the 3rd and 4th digits, then discarding them from there.  Pt. Has difficulty moving them to the tip of the left 2nd digit, and thumb in preparation for discarding them into the shallow dishes.  Pt. has improved with initiating and incorporating the left hand in tasks during treatment session. Pt. continues to benefit from OT services to work on improving LUE functioning in order to improve, and maximize participation with ADLs, and IADL tasks.  PERFORMANCE DEFICITS: in functional skills including ADLs, IADLs, coordination, dexterity, proprioception, ROM, strength, Fine motor control, Gross motor control, mobility, decreased knowledge of use of DME, and UE functional use, cognitive skills including attention, problem solving, and safety awareness, and psychosocial skills including environmental adaptation and routines and behaviors.   IMPAIRMENTS: are limiting patient from ADLs, IADLs, education, and leisure.   CO-MORBIDITIES: has  other co-morbidities that affects occupational performance. Patient will benefit from skilled OT to address above impairments and improve overall function.  MODIFICATION OR ASSISTANCE TO COMPLETE EVALUATION: Min-Moderate modification of tasks or assist with assess necessary to complete an evaluation.  OT OCCUPATIONAL PROFILE AND HISTORY: Detailed assessment: Review of records and additional review of physical, cognitive, psychosocial history related to current functional performance.  CLINICAL DECISION MAKING: Moderate - several treatment options, min-mod task modification necessary  REHAB  POTENTIAL: Good  EVALUATION COMPLEXITY: Moderate    PLAN:  OT FREQUENCY: 1x/week  OT DURATION: 12 weeks  PLANNED INTERVENTIONS: 97168 OT Re-evaluation, 97535 self care/ADL training, 02889 therapeutic exercise, 97530 therapeutic activity, 97112 neuromuscular re-education, 97140 manual therapy, 97018 paraffin, 02989 moist heat, 97034 contrast bath, passive range of motion, functional mobility training, patient/family education, and DME  and/or AE instructions  RECOMMENDED OTHER SERVICES: Pt currently working with PT  CONSULTED AND AGREED WITH PLAN OF CARE: Patient and family member/caregiver  PLAN FOR NEXT SESSION: see above  Damien Nap, OTS   This entire session was performed under direct supervision and direction of a licensed therapist/therapist assistant . I have personally read, edited and approve of the note as written.  Richardson Otter, MS, OTR/L   04/23/2024, 2:05 PM

## 2024-04-23 NOTE — Therapy (Signed)
 OUTPATIENT PHYSICAL THERAPY NEURO TREATMENT   Patient Name: Kerri [name] MRN: [id number] DOB:23-Jun-1931, 89 y.o., female Today's Date: 04/23/2024   PCP: Cyrus Selinda Moose, PA-C  REFERRING PROVIDER: Cyrus Selinda Moose, PA-C   END OF SESSION:   PT End of Session - 04/23/24 1321     Visit Number 7    Number of Visits 24    Date for PT Re-Evaluation 05/30/24    Authorization Time Period [employer] [id number] for 3 PT vsts from 6/16-7/14 (7/14 is 3/3)    PT Start Time 1320    PT Stop Time 1400    PT Time Calculation (min) 40 min    Equipment Utilized During Treatment Gait belt    Activity Tolerance Patient tolerated treatment well;No increased pain    Behavior During Therapy WFL for tasks assessed/performed               Past Medical History:  Diagnosis Date   Arthritis    Chronic systolic heart failure (HCC)    CKD (chronic kidney disease), stage III (HCC)    Dementia (HCC)    Hypertension    Hypothyroidism    Overactive bladder    Past Surgical History:  Procedure Laterality Date   INTRAMEDULLARY (IM) NAIL INTERTROCHANTERIC Left 09/06/2022   Procedure: INTRAMEDULLARY (IM) NAIL INTERTROCHANTERIC;  Surgeon: Tobie Priest, MD;  Location: [facility] ORS;  Service: Orthopedics;  Laterality: Left;   PACEMAKER LEADLESS INSERTION N/A 01/07/2021   Procedure: PACEMAKER LEADLESS INSERTION;  Surgeon: Ammon Blunt, MD;  Location: [facility];  Service: Cardiovascular;  Laterality: N/A;   PPM GENERATOR REMOVAL N/A 01/07/2021   Procedure: PPM GENERATOR REMOVAL;  Surgeon: Ammon Blunt, MD;  Location: [facility];  Service: Cardiovascular;  Laterality: N/A;   Patient Active Problem List   Diagnosis Date Noted   AKI (acute kidney injury) (HCC) 01/12/2024   Acute postoperative anemia due to expected blood loss 09/07/2022   Displaced intertrochanteric fracture of left femur, initial encounter for closed fracture (HCC) 09/05/2022   Orthostatic  hypotension 07/12/2022   Diarrhea 07/10/2022   COVID-19 virus infection 07/08/2022   Electrolyte abnormality 07/08/2022   Malnutrition of moderate degree 07/08/2022   Acute delirium 07/08/2022   History of recurrent UTI (urinary tract infection) 01/30/2022   Constipation    UTI (urinary tract infection) 10/15/2021   Elevated brain natriuretic peptide (BNP) level 10/15/2021   Generalized weakness 10/15/2021   Chronic kidney disease, stage 3b (HCC) 10/15/2021   Normal pressure hydrocephalus (HCC) 10/15/2021   Dementia without behavioral disturbance (HCC) 10/15/2021   Hypothyroidism 10/15/2021   Essential hypertension 10/15/2021   Overactive bladder 10/15/2021   Moderate mitral regurgitation 01/13/2021   Status post placement of cardiac pacemaker 01/13/2021   CHB (complete heart block) (HCC) 01/07/2021   Chronic systolic CHF (congestive heart failure) (HCC) 12/17/2020   Postmenopausal osteoporosis 08/26/2016   Mixed Alzheimer's and vascular dementia (HCC) 06/13/2014    ONSET DATE: Progressive decline over ~1-1.5 years  REFERRING DIAG:  M51.369 (ICD-10-CM) - Other intervertebral disc degeneration, lumbar region without mention of lumbar back pain or lower extremity pain  R53.81 (ICD-10-CM) - Other malaise  M62.81 (ICD-10-CM) - Muscle weakness (generalized)    THERAPY DIAG:  Other lack of coordination  Muscle weakness (generalized)  Difficulty in walking, not elsewhere classified  Unsteadiness on feet  Debility  Abnormality of gait and mobility  Rationale for Evaluation and Treatment: Rehabilitation  SUBJECTIVE:  SUBJECTIVE STATEMENT:  Pt reports she has done a lot this morning including getting her haircut and going out for lunch. Pt denies pain. Denies stumbles/falls. [name],  caregiver, reports pt is still having swelling of R LE and states pt reports pain primarily first thing in the morning. Caregiver reports when pt was coming to therapy 2x/week pt wasn't complaining of pain as much.  MD apt for R LE swelling on 04/18/2024 - MD recommended sequential compression devices (SCDs) as well as continued PT. Caregiver reports the SCDs would have to be purchased out of pocket.    From Eval: Patient previously known to this clinic. Patient with recent short hospitalization following AKI superimposed on CKI stage III with UTI. Pt received HHPT prior to returning to OPPT.  [name], caregiver, reports having taken patient to MD due to pt having decreased interest in doing things and then MD found out pt's kidney levels were low, resulting in her hospitalization.   Denies pain. Denies falls/stumbles at home.  Pt accompanied by: caregiver, [name]   PERTINENT HISTORY:  PMH: hypothyroidism, recurrent UTIs, osteoarthritis, prior anterior trochanter fracture, CKD stage IIIb, chronic systolic heart failure, dementia, hypertension, history of cardiac pacemaker, history of normal pressure hydrocephalus, overreactive bladder   Most recent A&P from Neurology note on 12/21/2023: Mixed dementia (Alzheimer's disease + vascular dementia) with significant ventriculomegaly (with negative lumbar drain trial in 2015) in a patient with history of sepsis secondary to UTI treated with trimethoprim  January 2023. Medication appears to help with UTI and improved mental status - reports good control with current medication Progressive behavioral disturbances with mood swings, outbursts, and apathy, exacerbated during the day. Seroquel (quetiapine) was initiated but caused significant lethargy. Alternative medications, lamotrigine and Depakote, were discussed for her mood-stabilizing properties and different tolerability profiles. - Discuss with primary care provider about reducing Seroquel dose to assess  tolerability. Consider 1/2 tablet (12.5 mg) instead.  - If unable to tolerate Seroquel at lower dose, will consider trial of Depakote Valproic Acid is an antiepileptic drug, used for many different conditions. Patient was advised not to stop medication abruptly, not to use alcohol. Acute side-effects can be abdominal pain, N/V, diarrhea and increased ammonia level. Long term side-effects are hair loss, weight gain, tremors and amenorrhea. Rarely, it can cause low platelets, liver failure and birth defects.  - Consider alternative medications such as lamotrigine or Depakote if Seroquel remains intolerable. - Document alternative medications in the chart for future reference.  Fatigue and somnolence Increased fatigue and somnolence potentially related to Seroquel, hypothyroidism, and medication side effects.  - Monitor heart rate and adjust medications if fatigue persists and heart rate remains low.  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 Symptoms include leg pain, particularly in the right leg, and dragging of the leg during ambulation. Physical therapy has been beneficial. Her symptoms arte chronic. She has had a reportedly negative lower extremity doppler, unavailable for my review.  Right leg pain with inability to pick up feet (right>left - likely from ventriculomegaly + peripheral neuropathy) patient with history of lumbar disc disease with stenosis - reports improvement during recent trial of prednisone. Suspicious for lumbar Disc Disease in addition to decondition Reviewed PCP note: Ongoing leg pain with intermittent weakness. On exam she does have some swelling around the right knee. Unsure if she could have some severe arthritis catching as she walks. Will obtain x-ray of the right knee today. Trial of prednisone 20 mg  once a day for 5 days. We did discuss that she has some  degenerative lumbar disc disease with stenosis and likely playing a role in right leg weakness. She has a pacemaker and we will defer MRI of the lumbar spine. Keep follow-up with neurology and if they feel it is necessary they could possibly pursue nerve conduction study. Inform caretaker to help patient and stay by her side when walking.   - Patient says that her legs feel heavy and that she has difficulty initiating the first step when walking - consistent with magnetic gait in patient with hydrocephalus and brain atrophy - Previous (-) Lumbar Puncture trial at [facility] (01/09/14). - Continue physical therapy to manage symptoms and improve mobility. - strict Emergency Room/return precautions    PAIN:  Are you having pain? No  PRECAUTIONS: Fall and ICD/Pacemaker  RED FLAGS: None and hx of spinal stenosis   WEIGHT BEARING RESTRICTIONS: No  FALLS: Has patient fallen in last 6 months? No  LIVING ENVIRONMENT: Lives with: caregiver 7 days a week. Alone for an hour or 2 a day, but only when alseep.   Lives in: House/apartment Stairs: Yes: Internal: 1 but ramp in place steps; none and External: 1 steps; none Has following equipment at home: Walker - 2 wheeled; transport chair; shower chair and grab bars   PLOF: Needs assistance with ADLs, Needs assistance with gait, Needs assistance with transfers, and Needs assistance with ambulation using RW - Primarily uses transport chair for mobility, but will ambulate with assistance to/from bathroom using RW in the home  PATIENT GOALS: Help her walking be easier and help her get stronger   OBJECTIVE:  Note: Objective measures were completed at Evaluation unless otherwise noted.  DIAGNOSTIC FINDINGS:   EXAM (from 10/28/2022): CT LUMBAR SPINE WITHOUT CONTRAST IMPRESSION: 1. Transitional anatomy. Adhering to convention from the prior lumbar spine MRI, the last fully formed disc space is labeled L5-S1. 2. Unchanged chronic compression fractures of  L4 and L5. No new fracture of the lumbar spine. 3. Unchanged lumbar spondylosis notable for moderate narrowing of the right lateral recess at L1-2 and L2-3, as well as mild spinal canal stenosis at L2-3 and L3-4.  Electronically Signed   By: Ryan Chess M.D.   On: 10/28/2022 16:42   EXAM (from 09/09/2022): CT HEAD WITHOUT CONTRAST  COMPARISON: CT head 09/05/2022, 08/15/2021   IMPRESSION: 1. No acute abnormality and no change from prior studies. 2. Atrophy and chronic microvascular ischemic change in the white matter.   Electronically Signed   By: Carlin Gaskins M.D.   On: 09/09/2022 11:50    COGNITION: Overall cognitive status: History of cognitive impairments - at baseline   SENSATION: Light touch: WFL on screen, but pt with hx of neuropathy in B LEs  COORDINATION: Not formally assessed, likely impacted by strength deficits as noted below  EDEMA:  Not formally assessed, but no significant edema noted  MUSCLE TONE: Not formally assessed   MUSCLE LENGTH: Not formally assessed   DTRs:  Not formally assessed   POSTURE: rounded shoulders, forward head, increased thoracic kyphosis, posterior pelvic tilt, and flexed trunk    LOWER EXTREMITY ROM:     Active  Right Eval Left Eval  Hip flexion Limited AROM by weakness Limited AROM by weakness (more impaired than R)  Hip extension Decreased ROM Decreased ROM  Hip abduction    Hip adduction    Hip internal rotation    Hip external rotation    Knee flexion [facility]  WFL  Knee extension [facility]  Ankle dorsiflexion [facility] WFL  Ankle plantarflexion [facility] WFL  Ankle inversion    Ankle eversion     (Blank rows = not tested)  LOWER EXTREMITY MMT:    MMT Right Eval Left Eval  Hip flexion 3+ 3-  Hip extension    Hip abduction    Hip adduction    Hip internal rotation    Hip external rotation    Knee flexion 4- 4-  Knee extension 4- 4-  Ankle dorsiflexion 4- 3-  Ankle plantarflexion 3+ 4-  Ankle inversion     Ankle eversion    (Blank rows = not tested)  Manual Muscle Test Scale 0/5 = No muscle contraction can be seen or felt 1/5 = Contraction can be felt, but there is no motion 2-/5 = Part moves through incomplete ROM w/ gravity decreased 2/5 = Part moves through complete ROM w/ gravity decreased 2+/5 = Part moves through incomplete ROM (<50%) against gravity or through complete ROM w/ gravity 3-/5 = Part moves through incomplete ROM (>50%) against gravity 3/5 = Part moves through complete ROM against gravity 3+/5 = Part moves through complete ROM against gravity/slight resistance 4-/5= Holds test position against slight to moderate pressure 4/5 = Part moves through complete ROM against gravity/moderate resistance 4+/5= Holds test position against moderate to strong pressure 5/5 = Part moves through complete ROM against gravity/full resistance  BED MOBILITY:  Findings: Sit to supine Min A for B LE management onto mat Supine to sit Min A for trunk upright with continued impaired motor plan Pt doesn't recall previously trained logroll technique to increase her independence, could be due to impaired carryover of technique in the clinic space Caregiver reports bed mobility has improved slightly at home  Pt with significant difficulty performing anterior scooting towards EOM requiring mod A for this   TRANSFERS: Sit to stand: Min A  Assistive device utilized: Environmental consultant - 2 wheeled     Stand to sit: Min A  Assistive device utilized: Environmental consultant - 2 wheeled     Chair to chair: Min A  Assistive device utilized: Environmental consultant - 2 wheeled      *continues to require cuing to turn fully prior to initiating sitting   RAMP:  Not tested  CURB:  Not tested  STAIRS: Not tested GAIT: Findings: Gait Characteristics: step to pattern, step through pattern, decreased step length- Right, decreased step length- Left, decreased stride length, decreased hip/knee flexion- Right, decreased hip/knee flexion- Left,  decreased ankle dorsiflexion- Right, decreased ankle dorsiflexion- Left, trunk flexed, narrow BOS, poor foot clearance- Right, and poor foot clearance- Left, Distance walked: ~20ft x2, Assistive device utilized:Walker - 2 wheeled, Level of assistance: Min A, and Comments: severely decreased gait speed  FUNCTIONAL TESTS:  5 times sit to stand: 83 seconds Timed up and go (TUG):  28min54 seconds using RW with min A 10 meter walk test: needs to be assessed  PATIENT SURVEYS:  PsFS: average: 3.67  Patient will completely turn 90degrees prior to sitting down during transfers with minimal cuing: 3  Patient will be able to maintain standing balance using B UE support on RW with only SBA while aide provides total A for LB clothing or ADLs: 5 Patient will stand with erect/upright posture when walking: 3  TREATMENT DATE: 04/23/2024   Pt arrives to therapy in her personal transport chair. Caregiver, [name], present.   L stand pivot transport chair>EOM using RW with light min A for lifting to stand then CGA/light min A for steadying when turning min cuing to turn fully and step back towards seat keeping AD with her, prior to initiating sitting   Repeated sit>stands from elevated EOM>RW using Blaze Pods set on a sequence to promote anterior trunk flexion when initiating rising to stand followed by Pod placed high to promote upright posture  x6 reps + 10 reps Requires occasional cuing to recall to push-up from EOM rather than pulling up on RW (pt reaching with 1 hand towards Pod) Goal of promoting increased forward trunk flexion/hip hinge followed by hip extension and upright posture in standing to reach tall target Started with 22in height mat, changed to 21in to replicate height of her transport chair seat with slight difficulty initially, but improved with repetition Skilled min A for  lifting throughout With fatigue pt starts to demo more crouched posture in standing with excessive hip/knee flexion   Standing with L UE support on RW during reaching overhead task to promote increaed trunk/hip/knee extension for improved upright posture via placing cones on top of punching bag plate - standing x3 reps placing 5 cones in a stack to progressively increase upright challenge - skilled min A for balance      In // bars performed:  Forward gait focused on reciprocal stepping pattern with B UE support  down/back 1 lap + 1 lap (seated break between) Skilled Min A for balance and cuing for increased hip/knee extension during stance to improve upright  Limited ability to improve today, may be due to fatigue Pt having more narrow BOS during gait in // bars today, cuing to improve Pt overall having increased fatigue today resulting in decreased laps able to perform prior to seated rest break and decreased ability to improve technique with cuing   PATIENT EDUCATION: Education details: Pt educated throughout session about proper posture and technique with exercises. Improved exercise technique, movement at target joints, use of target muscles after min to mod verbal, visual, tactile cues.  Person educated: Patient and Caregiver [name] Education method: Explanation Education comprehension: verbalized understanding and needs further education  HOME EXERCISE PROGRAM:  Access Code: [email or web address] URL: [email or web address] Date: 12/20/2023 Prepared by: Connell Kiss   Exercises - Sit to Stand with Armchair  - 1 x daily - 7 x weekly - 3 sets - 5 reps - Standing March with Counter Support  - 1 x daily - 7 x weekly - 2 sets - 10 reps - Supine Bridge  - 1 x daily - 7 x weekly - 2 sets - 10 reps - Supine Heel Slide with Strap  - 1 x daily - 7 x weekly - 2 sets - 10 reps - Supine Hip Abduction AROM  - 1 x daily - 7 x weekly - 3 sets - 10 reps - Seated Long Arc Quad  - 1 x daily - 7  x weekly - 3 sets - 10 reps - Seated March  - 1 x daily - 7 x weekly - 3 sets - 10 reps - 2 hold - Seated Hip Abduction with Resistance  - 1 x daily - 7 x weekly - 2 sets - 10 reps   GOALS: Goals reviewed with patient? Yes  SHORT TERM GOALS: Target date: 04/18/2024  Patient will be independent in home exercise program to  improve strength/mobility for better functional independence with ADLs.  Baseline: need to initiate Goal status: INITIAL   LONG TERM GOALS: Target date: 05/30/2024  Patient will perform bed mobility with consistently no more than min A utilizing bed features/supports as needed and with no more than than min cuing from aide/caregiver.  Baseline: requires up to mod A and mod/max verbal cuing Goal status: INITIAL  2.  Patient will increase PsFS score to equal to or greater than and average of 2 points to demonstrate statistically significant improvement in mobility and quality of life.  Baseline: 3.667 Goal status: INITIAL  3.  Patient (> 54 years old) will complete five times sit to stand test in < 30 seconds indicating an increased LE strength and improved balance.  Baseline: 83 seconds from her transport chair with RW in front of her and min progressed to light mod A  Goal status: INITIAL   4.  Patient will reduce timed up and go to <60 sec seconds to reduce fall risk and demonstrate improved transfer/gait ability.  Baseline: 52min54 seconds using RW with min A  Goal status: INITIAL  5.  Patient will increase 10 meter walk test by 0.37m/s as to improve gait speed for better household level ambulation and to reduce fall risk.  Baseline: 0.083m/s with RW  Goal status: INITIAL   ASSESSMENT:  CLINICAL IMPRESSION:  Therapy session focused on B LE functional strengthening and improved sequencing of body mechanics during sit<>stand transfers. Patient requires skilled min A during sit<>stands to initially clear hips from seat, but then can power up remainder of distance  into standing, from transport chair height. Patient overall with increased fatigue today due to having appointments prior to therapy session. Patient participated in gait training in // bars; however, due to fatigue pt with decreased ability to power up into improved trunk/hip/knee extension and sustain it today. Ms. Emmons will benefit from further skilled PT to improve these deficits in order to increase QOL and ease/safety with ADLs.   OBJECTIVE IMPAIRMENTS: Abnormal gait, decreased activity tolerance, decreased balance, decreased cognition, decreased coordination, decreased endurance, decreased knowledge of condition, decreased knowledge of use of DME, decreased mobility, difficulty walking, decreased ROM, decreased strength, impaired flexibility, impaired sensation, impaired UE functional use, and pain.   ACTIVITY LIMITATIONS: carrying, lifting, sitting, standing, transfers, bed mobility, continence, bathing, toileting, dressing, self feeding, reach over head, hygiene/grooming, and locomotion level  PARTICIPATION LIMITATIONS: meal prep, cleaning, laundry, shopping, and community activity  PERSONAL FACTORS: Age, Time since onset of injury/illness/exacerbation, and 3+ comorbidities: hypothyroidism, recurrent UTIs, osteoarthritis, prior anterior trochanter fracture, CKD stage IIIb, chronic systolic heart failure, dementia, hypertension, history of cardiac pacemaker, history of normal pressure hydrocephalus, overreactive bladder are also affecting patient's functional outcome.   REHAB POTENTIAL: Good  CLINICAL DECISION MAKING: Evolving/moderate complexity  EVALUATION COMPLEXITY: Moderate  PLAN:  PT FREQUENCY: 1-2x/week  PT DURATION: 12 weeks  PLANNED INTERVENTIONS: 97164- PT Re-evaluation, 97750- Physical Performance Testing, 97110-Therapeutic exercises, 97530- Therapeutic activity, 97112- Neuromuscular re-education, 97535- Self Care, [id number]- Manual therapy, [phone]- Gait training, [phone]-  Canalith repositioning, Patient/Family education, Balance training, Stair training, Joint mobilization, Vestibular training, Visual/preceptual remediation/compensation, Cognitive remediation, DME instructions, Cryotherapy, Moist heat, and Biofeedback  PLAN FOR NEXT SESSIONS:   - continue gait training and functional B LE strengthening in // bars - hip flexor stretch - supine bridges - sit<>stands  - try weighted vest? - standing reaching overhead/upright for improved hip extension and upright posture - gait training - standing marches/hip flexion for improved foot  clearance  - add weights if/when appropriate    Kameron Blethen, PT, DPT, NCS, CSRS Physical Therapist - [place]  [facility]  2:02 PM 04/23/24

## 2024-04-25 ENCOUNTER — Ambulatory Visit: Admitting: Physical Therapy

## 2024-04-25 ENCOUNTER — Encounter

## 2024-04-30 ENCOUNTER — Ambulatory Visit: Admitting: Physical Therapy

## 2024-04-30 ENCOUNTER — Telehealth: Payer: Self-pay | Admitting: Physical Therapy

## 2024-04-30 ENCOUNTER — Ambulatory Visit

## 2024-04-30 NOTE — Telephone Encounter (Signed)
 Therapist called patient's caregiver, [name], to notify her of patient's insurance approval for 24 more physical therapy visits between 04/30/2024 - 07/23/2024. Therapist notified caregiver of patient's next appointment date and time.  Connell Kiss, PT, DPT, NCS, CSRS

## 2024-04-30 NOTE — Therapy (Incomplete)
 OUTPATIENT PHYSICAL THERAPY NEURO TREATMENT   Patient Name: Kerri [name] MRN: [id number] DOB:05-17-1931, 89 y.o., female Today's Date: 04/30/2024   PCP: Cyrus Selinda Moose, PA-C  REFERRING PROVIDER: Cyrus Selinda Moose, PA-C   END OF SESSION: ***         Past Medical History:  Diagnosis Date   Arthritis    Chronic systolic heart failure (HCC)    CKD (chronic kidney disease), stage III (HCC)    Dementia (HCC)    Hypertension    Hypothyroidism    Overactive bladder    Past Surgical History:  Procedure Laterality Date   INTRAMEDULLARY (IM) NAIL INTERTROCHANTERIC Left 09/06/2022   Procedure: INTRAMEDULLARY (IM) NAIL INTERTROCHANTERIC;  Surgeon: Tobie Priest, MD;  Location: [facility] ORS;  Service: Orthopedics;  Laterality: Left;   PACEMAKER LEADLESS INSERTION N/A 01/07/2021   Procedure: PACEMAKER LEADLESS INSERTION;  Surgeon: Ammon Blunt, MD;  Location: [facility]IVE [facility];  Service: Cardiovascular;  Laterality: N/A;   PPM GENERATOR REMOVAL N/A 01/07/2021   Procedure: PPM GENERATOR REMOVAL;  Surgeon: Ammon Blunt, MD;  Location: [facility];  Service: Cardiovascular;  Laterality: N/A;   Patient Active Problem List   Diagnosis Date Noted   AKI (acute kidney injury) (HCC) 01/12/2024   Acute postoperative anemia due to expected blood loss 09/07/2022   Displaced intertrochanteric fracture of left femur, initial encounter for closed fracture (HCC) 09/05/2022   Orthostatic hypotension 07/12/2022   Diarrhea 07/10/2022   COVID-19 virus infection 07/08/2022   Electrolyte abnormality 07/08/2022   Malnutrition of moderate degree 07/08/2022   Acute delirium 07/08/2022   History of recurrent UTI (urinary tract infection) 01/30/2022   Constipation    UTI (urinary tract infection) 10/15/2021   Elevated brain natriuretic peptide (BNP) level 10/15/2021   Generalized weakness 10/15/2021   Chronic kidney disease, stage 3b (HCC) 10/15/2021   Normal  pressure hydrocephalus (HCC) 10/15/2021   Dementia without behavioral disturbance (HCC) 10/15/2021   Hypothyroidism 10/15/2021   Essential hypertension 10/15/2021   Overactive bladder 10/15/2021   Moderate mitral regurgitation 01/13/2021   Status post placement of cardiac pacemaker 01/13/2021   CHB (complete heart block) (HCC) 01/07/2021   Chronic systolic CHF (congestive heart failure) (HCC) 12/17/2020   Postmenopausal osteoporosis 08/26/2016   Mixed Alzheimer's and vascular dementia (HCC) 06/13/2014    ONSET DATE: Progressive decline over ~1-1.5 years  REFERRING DIAG:  M51.369 (ICD-10-CM) - Other intervertebral disc degeneration, lumbar region without mention of lumbar back pain or lower extremity pain  R53.81 (ICD-10-CM) - Other malaise  M62.81 (ICD-10-CM) - Muscle weakness (generalized)    THERAPY DIAG: *** No diagnosis found.  Rationale for Evaluation and Treatment: Rehabilitation  SUBJECTIVE:  SUBJECTIVE STATEMENT:  *** 04/30/2024 - 07/23/2024 Number of visits: 24 Visit(s)   ***  Pt reports she has done a lot this morning including getting her haircut and going out for lunch. Pt denies pain. Denies stumbles/falls. [name], caregiver, reports pt is still having swelling of R LE and states pt reports pain primarily first thing in the morning. Caregiver reports when pt was coming to therapy 2x/week pt wasn't complaining of pain as much.  MD apt for R LE swelling on 04/18/2024 - MD recommended sequential compression devices (SCDs) as well as continued PT. Caregiver reports the SCDs would have to be purchased out of pocket.   ***    From Eval: Patient previously known to this clinic. Patient with recent short hospitalization following AKI superimposed on CKI stage III with UTI. Pt received HHPT  prior to returning to OPPT.  [name], caregiver, reports having taken patient to MD due to pt having decreased interest in doing things and then MD found out pt's kidney levels were low, resulting in her hospitalization.   Denies pain. Denies falls/stumbles at home.  Pt accompanied by: caregiver, [name]   PERTINENT HISTORY:  PMH: hypothyroidism, recurrent UTIs, osteoarthritis, prior anterior trochanter fracture, CKD stage IIIb, chronic systolic heart failure, dementia, hypertension, history of cardiac pacemaker, history of normal pressure hydrocephalus, overreactive bladder   Most recent A&P from Neurology note on 12/21/2023: Mixed dementia (Alzheimer's disease + vascular dementia) with significant ventriculomegaly (with negative lumbar drain trial in 2015) in a patient with history of sepsis secondary to UTI treated with trimethoprim  January 2023. Medication appears to help with UTI and improved mental status - reports good control with current medication Progressive behavioral disturbances with mood swings, outbursts, and apathy, exacerbated during the day. Seroquel (quetiapine) was initiated but caused significant lethargy. Alternative medications, lamotrigine and Depakote, were discussed for her mood-stabilizing properties and different tolerability profiles. - Discuss with primary care provider about reducing Seroquel dose to assess tolerability. Consider 1/2 tablet (12.5 mg) instead.  - If unable to tolerate Seroquel at lower dose, will consider trial of Depakote Valproic Acid is an antiepileptic drug, used for many different conditions. Patient was advised not to stop medication abruptly, not to use alcohol. Acute side-effects can be abdominal pain, N/V, diarrhea and increased ammonia level. Long term side-effects are hair loss, weight gain, tremors and amenorrhea. Rarely, it can cause low platelets, liver failure and birth defects.  - Consider alternative medications such as lamotrigine or Depakote  if Seroquel remains intolerable. - Document alternative medications in the chart for future reference.  Fatigue and somnolence Increased fatigue and somnolence potentially related to Seroquel, hypothyroidism, and medication side effects.  - Monitor heart rate and adjust medications if fatigue persists and heart rate remains low.  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 Symptoms include leg pain, particularly in the right leg, and dragging of the leg during ambulation. Physical therapy has been beneficial. Her symptoms arte chronic. She has had a reportedly negative lower extremity doppler, unavailable for my review.  Right leg pain with inability to pick up feet (right>left - likely from ventriculomegaly + peripheral neuropathy) patient with history of lumbar disc disease with stenosis - reports improvement during recent trial of prednisone. Suspicious for lumbar Disc Disease in addition to decondition Reviewed PCP note: Ongoing leg pain with intermittent weakness. On exam she does have some swelling around the right knee. Unsure if she could have some severe arthritis catching  as she walks. Will obtain x-ray of the right knee today. Trial of prednisone 20 mg once a day for 5 days. We did discuss that she has some degenerative lumbar disc disease with stenosis and likely playing a role in right leg weakness. She has a pacemaker and we will defer MRI of the lumbar spine. Keep follow-up with neurology and if they feel it is necessary they could possibly pursue nerve conduction study. Inform caretaker to help patient and stay by her side when walking.   - Patient says that her legs feel heavy and that she has difficulty initiating the first step when walking - consistent with magnetic gait in patient with hydrocephalus and brain atrophy - Previous (-) Lumbar Puncture trial at [facility] (01/09/14). -  Continue physical therapy to manage symptoms and improve mobility. - strict Emergency Room/return precautions    PAIN:  Are you having pain? No  PRECAUTIONS: Fall and ICD/Pacemaker  RED FLAGS: None and hx of spinal stenosis   WEIGHT BEARING RESTRICTIONS: No  FALLS: Has patient fallen in last 6 months? No  LIVING ENVIRONMENT: Lives with: caregiver 7 days a week. Alone for an hour or 2 a day, but only when alseep.   Lives in: House/apartment Stairs: Yes: Internal: 1 but ramp in place steps; none and External: 1 steps; none Has following equipment at home: Walker - 2 wheeled; transport chair; shower chair and grab bars   PLOF: Needs assistance with ADLs, Needs assistance with gait, Needs assistance with transfers, and Needs assistance with ambulation using RW - Primarily uses transport chair for mobility, but will ambulate with assistance to/from bathroom using RW in the home  PATIENT GOALS: Help her walking be easier and help her get stronger   OBJECTIVE:  Note: Objective measures were completed at Evaluation unless otherwise noted.  DIAGNOSTIC FINDINGS:   EXAM (from 10/28/2022): CT LUMBAR SPINE WITHOUT CONTRAST IMPRESSION: 1. Transitional anatomy. Adhering to convention from the prior lumbar spine MRI, the last fully formed disc space is labeled L5-S1. 2. Unchanged chronic compression fractures of L4 and L5. No new fracture of the lumbar spine. 3. Unchanged lumbar spondylosis notable for moderate narrowing of the right lateral recess at L1-2 and L2-3, as well as mild spinal canal stenosis at L2-3 and L3-4.  Electronically Signed   By: Ryan Chess M.D.   On: 10/28/2022 16:42   EXAM (from 09/09/2022): CT HEAD WITHOUT CONTRAST  COMPARISON: CT head 09/05/2022, 08/15/2021   IMPRESSION: 1. No acute abnormality and no change from prior studies. 2. Atrophy and chronic microvascular ischemic change in the white matter.   Electronically Signed   By: Carlin Gaskins  M.D.   On: 09/09/2022 11:50    COGNITION: Overall cognitive status: History of cognitive impairments - at baseline   SENSATION: Light touch: WFL on screen, but pt with hx of neuropathy in B LEs  COORDINATION: Not formally assessed, likely impacted by strength deficits as noted below  EDEMA:  Not formally assessed, but no significant edema noted  MUSCLE TONE: Not formally assessed   MUSCLE LENGTH: Not formally assessed   DTRs:  Not formally assessed   POSTURE: rounded shoulders, forward head, increased thoracic kyphosis, posterior pelvic tilt, and flexed trunk    LOWER EXTREMITY ROM:     Active  Right Eval Left Eval  Hip flexion Limited AROM by weakness Limited AROM by weakness (more impaired than R)  Hip extension Decreased ROM Decreased ROM  Hip abduction    Hip adduction  Hip internal rotation    Hip external rotation    Knee flexion [facility] WFL  Knee extension [facility]  Ankle dorsiflexion [facility]  Ankle plantarflexion [facility] WFL  Ankle inversion    Ankle eversion     (Blank rows = not tested)  LOWER EXTREMITY MMT:    MMT Right Eval Left Eval  Hip flexion 3+ 3-  Hip extension    Hip abduction    Hip adduction    Hip internal rotation    Hip external rotation    Knee flexion 4- 4-  Knee extension 4- 4-  Ankle dorsiflexion 4- 3-  Ankle plantarflexion 3+ 4-  Ankle inversion    Ankle eversion    (Blank rows = not tested)  Manual Muscle Test Scale 0/5 = No muscle contraction can be seen or felt 1/5 = Contraction can be felt, but there is no motion 2-/5 = Part moves through incomplete ROM w/ gravity decreased 2/5 = Part moves through complete ROM w/ gravity decreased 2+/5 = Part moves through incomplete ROM (<50%) against gravity or through complete ROM w/ gravity 3-/5 = Part moves through incomplete ROM (>50%) against gravity 3/5 = Part moves through complete ROM against gravity 3+/5 = Part moves through complete ROM against gravity/slight  resistance 4-/5= Holds test position against slight to moderate pressure 4/5 = Part moves through complete ROM against gravity/moderate resistance 4+/5= Holds test position against moderate to strong pressure 5/5 = Part moves through complete ROM against gravity/full resistance  BED MOBILITY:  Findings: Sit to supine Min A for B LE management onto mat Supine to sit Min A for trunk upright with continued impaired motor plan Pt doesn't recall previously trained logroll technique to increase her independence, could be due to impaired carryover of technique in the clinic space Caregiver reports bed mobility has improved slightly at home  Pt with significant difficulty performing anterior scooting towards EOM requiring mod A for this   TRANSFERS: Sit to stand: Min A  Assistive device utilized: Environmental consultant - 2 wheeled     Stand to sit: Min A  Assistive device utilized: Environmental consultant - 2 wheeled     Chair to chair: Min A  Assistive device utilized: Environmental consultant - 2 wheeled      *continues to require cuing to turn fully prior to initiating sitting   RAMP:  Not tested  CURB:  Not tested  STAIRS: Not tested GAIT: Findings: Gait Characteristics: step to pattern, step through pattern, decreased step length- Right, decreased step length- Left, decreased stride length, decreased hip/knee flexion- Right, decreased hip/knee flexion- Left, decreased ankle dorsiflexion- Right, decreased ankle dorsiflexion- Left, trunk flexed, narrow BOS, poor foot clearance- Right, and poor foot clearance- Left, Distance walked: ~51ft x2, Assistive device utilized:Walker - 2 wheeled, Level of assistance: Min A, and Comments: severely decreased gait speed  FUNCTIONAL TESTS:  5 times sit to stand: 83 seconds Timed up and go (TUG):  62min54 seconds using RW with min A 10 meter walk test: needs to be assessed  PATIENT SURVEYS:  PsFS: average: 3.67  Patient will completely turn 90degrees prior to sitting down during transfers with  minimal cuing: 3  Patient will be able to maintain standing balance using B UE support on RW with only SBA while aide provides total A for LB clothing or ADLs: 5 Patient will stand with erect/upright posture when walking: 3  TREATMENT DATE: 04/30/2024   ***  Pt arrives to therapy in her personal transport chair. Caregiver, [name], present.   L stand pivot transport chair>EOM using RW with light min A for lifting to stand then CGA/light min A for steadying when turning min cuing to turn fully and step back towards seat keeping AD with her, prior to initiating sitting   Repeated sit>stands from elevated EOM>RW using Blaze Pods set on a sequence to promote anterior trunk flexion when initiating rising to stand followed by Pod placed high to promote upright posture  x6 reps + 10 reps Requires occasional cuing to recall to push-up from EOM rather than pulling up on RW (pt reaching with 1 hand towards Pod) Goal of promoting increased forward trunk flexion/hip hinge followed by hip extension and upright posture in standing to reach tall target Started with 22in height mat, changed to 21in to replicate height of her transport chair seat with slight difficulty initially, but improved with repetition Skilled min A for lifting throughout With fatigue pt starts to demo more crouched posture in standing with excessive hip/knee flexion   Standing with L UE support on RW during reaching overhead task to promote increaed trunk/hip/knee extension for improved upright posture via placing cones on top of punching bag plate - standing x3 reps placing 5 cones in a stack to progressively increase upright challenge - skilled min A for balance      In // bars performed:  Forward gait focused on reciprocal stepping pattern with B UE support  down/back 1 lap + 1 lap (seated break  between) Skilled Min A for balance and cuing for increased hip/knee extension during stance to improve upright  Limited ability to improve today, may be due to fatigue Pt having more narrow BOS during gait in // bars today, cuing to improve Pt overall having increased fatigue today resulting in decreased laps able to perform prior to seated rest break and decreased ability to improve technique with cuing  ***  PATIENT EDUCATION: Education details: Pt educated throughout session about proper posture and technique with exercises. Improved exercise technique, movement at target joints, use of target muscles after min to mod verbal, visual, tactile cues.  Person educated: Patient and Caregiver [name] Education method: Explanation Education comprehension: verbalized understanding and needs further education  HOME EXERCISE PROGRAM:  Access Code: [email or web address] URL: [email or web address] Date: 12/20/2023 Prepared by: Connell Kiss   Exercises - Sit to Stand with Armchair  - 1 x daily - 7 x weekly - 3 sets - 5 reps - Standing March with Counter Support  - 1 x daily - 7 x weekly - 2 sets - 10 reps - Supine Bridge  - 1 x daily - 7 x weekly - 2 sets - 10 reps - Supine Heel Slide with Strap  - 1 x daily - 7 x weekly - 2 sets - 10 reps - Supine Hip Abduction AROM  - 1 x daily - 7 x weekly - 3 sets - 10 reps - Seated Long Arc Quad  - 1 x daily - 7 x weekly - 3 sets - 10 reps - Seated March  - 1 x daily - 7 x weekly - 3 sets - 10 reps - 2 hold - Seated Hip Abduction with Resistance  - 1 x daily - 7 x weekly - 2 sets - 10 reps   GOALS: Goals reviewed with patient? Yes  SHORT TERM GOALS: Target date: 04/18/2024  Patient will be independent in home exercise  program to improve strength/mobility for better functional independence with ADLs.  Baseline: need to initiate Goal status: INITIAL   LONG TERM GOALS: Target date: 05/30/2024  Patient will perform bed mobility with consistently no more  than min A utilizing bed features/supports as needed and with no more than than min cuing from aide/caregiver.  Baseline: requires up to mod A and mod/max verbal cuing Goal status: INITIAL  2.  Patient will increase PsFS score to equal to or greater than and average of 2 points to demonstrate statistically significant improvement in mobility and quality of life.  Baseline: 3.667 Goal status: INITIAL  3.  Patient (> 8 years old) will complete five times sit to stand test in < 30 seconds indicating an increased LE strength and improved balance.  Baseline: 83 seconds from her transport chair with RW in front of her and min progressed to light mod A  Goal status: INITIAL   4.  Patient will reduce timed up and go to <60 sec seconds to reduce fall risk and demonstrate improved transfer/gait ability.  Baseline: 54min54 seconds using RW with min A  Goal status: INITIAL  5.  Patient will increase 10 meter walk test by 0.46m/s as to improve gait speed for better household level ambulation and to reduce fall risk.  Baseline: 0.066m/s with RW  Goal status: INITIAL   ASSESSMENT:  CLINICAL IMPRESSION: *** Therapy session focused on B LE functional strengthening and improved sequencing of body mechanics during sit<>stand transfers. Patient requires skilled min A during sit<>stands to initially clear hips from seat, but then can power up remainder of distance into standing, from transport chair height. Patient overall with increased fatigue today due to having appointments prior to therapy session. Patient participated in gait training in // bars; however, due to fatigue pt with decreased ability to power up into improved trunk/hip/knee extension and sustain it today. Ms. Fross will benefit from further skilled PT to improve these deficits in order to increase QOL and ease/safety with ADLs.   OBJECTIVE IMPAIRMENTS: Abnormal gait, decreased activity tolerance, decreased balance, decreased cognition,  decreased coordination, decreased endurance, decreased knowledge of condition, decreased knowledge of use of DME, decreased mobility, difficulty walking, decreased ROM, decreased strength, impaired flexibility, impaired sensation, impaired UE functional use, and pain.   ACTIVITY LIMITATIONS: carrying, lifting, sitting, standing, transfers, bed mobility, continence, bathing, toileting, dressing, self feeding, reach over head, hygiene/grooming, and locomotion level  PARTICIPATION LIMITATIONS: meal prep, cleaning, laundry, shopping, and community activity  PERSONAL FACTORS: Age, Time since onset of injury/illness/exacerbation, and 3+ comorbidities: hypothyroidism, recurrent UTIs, osteoarthritis, prior anterior trochanter fracture, CKD stage IIIb, chronic systolic heart failure, dementia, hypertension, history of cardiac pacemaker, history of normal pressure hydrocephalus, overreactive bladder are also affecting patient's functional outcome.   REHAB POTENTIAL: Good  CLINICAL DECISION MAKING: Evolving/moderate complexity  EVALUATION COMPLEXITY: Moderate  PLAN:  PT FREQUENCY: 1-2x/week  PT DURATION: 12 weeks  PLANNED INTERVENTIONS: [id number]- PT Re-evaluation, 97750- Physical Performance Testing, 97110-Therapeutic exercises, 97530- Therapeutic activity, 97112- Neuromuscular re-education, 97535- Self Care, [id number]- Manual therapy, [phone]- Gait training, [phone]- Canalith repositioning, Patient/Family education, Balance training, Stair training, Joint mobilization, Vestibular training, Visual/preceptual remediation/compensation, Cognitive remediation, DME instructions, Cryotherapy, Moist heat, and Biofeedback  PLAN FOR NEXT SESSIONS:  *** - continue gait training and functional B LE strengthening in // bars - hip flexor stretch - supine bridges - sit<>stands  - try weighted vest? - standing reaching overhead/upright for improved hip extension and upright posture - gait training - standing marches/hip  flexion  for improved foot clearance  - add weights if/when appropriate    Makel Mcmann, PT, DPT, NCS, CSRS Physical Therapist - [place]  [facility]/[place]  8:21 AM 04/30/24

## 2024-05-02 ENCOUNTER — Ambulatory Visit: Admitting: Physical Therapy

## 2024-05-02 ENCOUNTER — Encounter

## 2024-05-02 NOTE — Therapy (Incomplete)
 OUTPATIENT PHYSICAL THERAPY NEURO TREATMENT   Patient Name: Kerri [name] MRN: [id number] DOB:25-Feb-1931, 89 y.o., female Today's Date: 05/02/2024   PCP: Cyrus Selinda Moose, PA-C  REFERRING PROVIDER: Cyrus Selinda Moose, PA-C   END OF SESSION: ***         Past Medical History:  Diagnosis Date   Arthritis    Chronic systolic heart failure (HCC)    CKD (chronic kidney disease), stage III (HCC)    Dementia (HCC)    Hypertension    Hypothyroidism    Overactive bladder    Past Surgical History:  Procedure Laterality Date   INTRAMEDULLARY (IM) NAIL INTERTROCHANTERIC Left 09/06/2022   Procedure: INTRAMEDULLARY (IM) NAIL INTERTROCHANTERIC;  Surgeon: Tobie Priest, MD;  Location: [facility] ORS;  Service: Orthopedics;  Laterality: Left;   PACEMAKER LEADLESS INSERTION N/A 01/07/2021   Procedure: PACEMAKER LEADLESS INSERTION;  Surgeon: Ammon Blunt, MD;  Location: [facility] INVASIVE [facility];  Service: Cardiovascular;  Laterality: N/A;   PPM GENERATOR REMOVAL N/A 01/07/2021   Procedure: PPM GENERATOR REMOVAL;  Surgeon: Ammon Blunt, MD;  Location: [facility] INVASIVE CV [facility];  Service: Cardiovascular;  Laterality: N/A;   Patient Active Problem List   Diagnosis Date Noted   AKI (acute kidney injury) (HCC) 01/12/2024   Acute postoperative anemia due to expected blood loss 09/07/2022   Displaced intertrochanteric fracture of left femur, initial encounter for closed fracture (HCC) 09/05/2022   Orthostatic hypotension 07/12/2022   Diarrhea 07/10/2022   COVID-19 virus infection 07/08/2022   Electrolyte abnormality 07/08/2022   Malnutrition of moderate degree 07/08/2022   Acute delirium 07/08/2022   History of recurrent UTI (urinary tract infection) 01/30/2022   Constipation    UTI (urinary tract infection) 10/15/2021   Elevated brain natriuretic peptide (BNP) level 10/15/2021   Generalized weakness 10/15/2021   Chronic kidney disease, stage 3b (HCC) 10/15/2021   Normal  pressure hydrocephalus (HCC) 10/15/2021   Dementia without behavioral disturbance (HCC) 10/15/2021   Hypothyroidism 10/15/2021   Essential hypertension 10/15/2021   Overactive bladder 10/15/2021   Moderate mitral regurgitation 01/13/2021   Status post placement of cardiac pacemaker 01/13/2021   CHB (complete heart block) (HCC) 01/07/2021   Chronic systolic CHF (congestive heart failure) (HCC) 12/17/2020   Postmenopausal osteoporosis 08/26/2016   Mixed Alzheimer's and vascular dementia (HCC) 06/13/2014    ONSET DATE: Progressive decline over ~1-1.5 years  REFERRING DIAG:  M51.369 (ICD-10-CM) - Other intervertebral disc degeneration, lumbar region without mention of lumbar back pain or lower extremity pain  R53.81 (ICD-10-CM) - Other malaise  M62.81 (ICD-10-CM) - Muscle weakness (generalized)    THERAPY DIAG: *** No diagnosis found.  Rationale for Evaluation and Treatment: Rehabilitation  SUBJECTIVE:  SUBJECTIVE STATEMENT:  *** 04/30/2024 - 07/23/2024 Number of visits: 24 Visit(s)   ***  Pt reports she has done a lot this morning including getting her haircut and going out for lunch. Pt denies pain. Denies stumbles/falls. [name], caregiver, reports pt is still having swelling of R LE and states pt reports pain primarily first thing in the morning. Caregiver reports when pt was coming to therapy 2x/week pt wasn't complaining of pain as much.  MD apt for R LE swelling on 04/18/2024 - MD recommended sequential compression devices (SCDs) as well as continued PT. Caregiver reports the SCDs would have to be purchased out of pocket.   ***    From Eval: Patient previously known to this clinic. Patient with recent short hospitalization following AKI superimposed on CKI stage III with UTI. Pt received HHPT  prior to returning to OPPT.  [name], caregiver, reports having taken patient to MD due to pt having decreased interest in doing things and then MD found out pt's kidney levels were low, resulting in her hospitalization.   Denies pain. Denies falls/stumbles at home.  Pt accompanied by: caregiver, [name]   PERTINENT HISTORY:  PMH: hypothyroidism, recurrent UTIs, osteoarthritis, prior anterior trochanter fracture, CKD stage IIIb, chronic systolic heart failure, dementia, hypertension, history of cardiac pacemaker, history of normal pressure hydrocephalus, overreactive bladder   Most recent A&P from Neurology note on 12/21/2023: Mixed dementia (Alzheimer's disease + vascular dementia) with significant ventriculomegaly (with negative lumbar drain trial in 2015) in a patient with history of sepsis secondary to UTI treated with trimethoprim  January 2023. Medication appears to help with UTI and improved mental status - reports good control with current medication Progressive behavioral disturbances with mood swings, outbursts, and apathy, exacerbated during the day. Seroquel (quetiapine) was initiated but caused significant lethargy. Alternative medications, lamotrigine and Depakote, were discussed for her mood-stabilizing properties and different tolerability profiles. - Discuss with primary care provider about reducing Seroquel dose to assess tolerability. Consider 1/2 tablet (12.5 mg) instead.  - If unable to tolerate Seroquel at lower dose, will consider trial of Depakote Valproic Acid is an antiepileptic drug, used for many different conditions. Patient was advised not to stop medication abruptly, not to use alcohol. Acute side-effects can be abdominal pain, N/V, diarrhea and increased ammonia level. Long term side-effects are hair loss, weight gain, tremors and amenorrhea. Rarely, it can cause low platelets, liver failure and birth defects.  - Consider alternative medications such as lamotrigine or Depakote  if Seroquel remains intolerable. - Document alternative medications in the chart for future reference.  Fatigue and somnolence Increased fatigue and somnolence potentially related to Seroquel, hypothyroidism, and medication side effects.  - Monitor heart rate and adjust medications if fatigue persists and heart rate remains low.  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 Symptoms include leg pain, particularly in the right leg, and dragging of the leg during ambulation. Physical therapy has been beneficial. Her symptoms arte chronic. She has had a reportedly negative lower extremity doppler, unavailable for my review.  Right leg pain with inability to pick up feet (right>left - likely from ventriculomegaly + peripheral neuropathy) patient with history of lumbar disc disease with stenosis - reports improvement during recent trial of prednisone. Suspicious for lumbar Disc Disease in addition to decondition Reviewed PCP note: Ongoing leg pain with intermittent weakness. On exam she does have some swelling around the right knee. Unsure if she could have some severe arthritis catching  as she walks. Will obtain x-ray of the right knee today. Trial of prednisone 20 mg once a day for 5 days. We did discuss that she has some degenerative lumbar disc disease with stenosis and likely playing a role in right leg weakness. She has a pacemaker and we will defer MRI of the lumbar spine. Keep follow-up with neurology and if they feel it is necessary they could possibly pursue nerve conduction study. Inform caretaker to help patient and stay by her side when walking.   - Patient says that her legs feel heavy and that she has difficulty initiating the first step when walking - consistent with magnetic gait in patient with hydrocephalus and brain atrophy - Previous (-) Lumbar Puncture trial at [facility] (01/09/14). -  Continue physical therapy to manage symptoms and improve mobility. - strict Emergency Room/return precautions    PAIN:  Are you having pain? No  PRECAUTIONS: Fall and ICD/Pacemaker  RED FLAGS: None and hx of spinal stenosis   WEIGHT BEARING RESTRICTIONS: No  FALLS: Has patient fallen in last 6 months? No  LIVING ENVIRONMENT: Lives with: caregiver 7 days a week. Alone for an hour or 2 a day, but only when alseep.   Lives in: House/apartment Stairs: Yes: Internal: 1 but ramp in place steps; none and External: 1 steps; none Has following equipment at home: Walker - 2 wheeled; transport chair; shower chair and grab bars   PLOF: Needs assistance with ADLs, Needs assistance with gait, Needs assistance with transfers, and Needs assistance with ambulation using RW - Primarily uses transport chair for mobility, but will ambulate with assistance to/from bathroom using RW in the home  PATIENT GOALS: Help her walking be easier and help her get stronger   OBJECTIVE:  Note: Objective measures were completed at Evaluation unless otherwise noted.  DIAGNOSTIC FINDINGS:   EXAM (from 10/28/2022): CT LUMBAR SPINE WITHOUT CONTRAST IMPRESSION: 1. Transitional anatomy. Adhering to convention from the prior lumbar spine MRI, the last fully formed disc space is labeled L5-S1. 2. Unchanged chronic compression fractures of L4 and L5. No new fracture of the lumbar spine. 3. Unchanged lumbar spondylosis notable for moderate narrowing of the right lateral recess at L1-2 and L2-3, as well as mild spinal canal stenosis at L2-3 and L3-4.  Electronically Signed   By: Ryan Chess M.D.   On: 10/28/2022 16:42   EXAM (from 09/09/2022): CT HEAD WITHOUT CONTRAST  COMPARISON: CT head 09/05/2022, 08/15/2021   IMPRESSION: 1. No acute abnormality and no change from prior studies. 2. Atrophy and chronic microvascular ischemic change in the white matter.   Electronically Signed   By: Carlin Gaskins  M.D.   On: 09/09/2022 11:50    COGNITION: Overall cognitive status: History of cognitive impairments - at baseline   SENSATION: Light touch: WFL on screen, but pt with hx of neuropathy in B LEs  COORDINATION: Not formally assessed, likely impacted by strength deficits as noted below  EDEMA:  Not formally assessed, but no significant edema noted  MUSCLE TONE: Not formally assessed   MUSCLE LENGTH: Not formally assessed   DTRs:  Not formally assessed   POSTURE: rounded shoulders, forward head, increased thoracic kyphosis, posterior pelvic tilt, and flexed trunk    LOWER EXTREMITY ROM:     Active  Right Eval Left Eval  Hip flexion Limited AROM by weakness Limited AROM by weakness (more impaired than R)  Hip extension Decreased ROM Decreased ROM  Hip abduction    Hip adduction  Hip internal rotation    Hip external rotation    Knee flexion [facility] WFL  Knee extension [facility]  Ankle dorsiflexion [facility]  Ankle plantarflexion [facility] WFL  Ankle inversion    Ankle eversion     (Blank rows = not tested)  LOWER EXTREMITY MMT:    MMT Right Eval Left Eval  Hip flexion 3+ 3-  Hip extension    Hip abduction    Hip adduction    Hip internal rotation    Hip external rotation    Knee flexion 4- 4-  Knee extension 4- 4-  Ankle dorsiflexion 4- 3-  Ankle plantarflexion 3+ 4-  Ankle inversion    Ankle eversion    (Blank rows = not tested)  Manual Muscle Test Scale 0/5 = No muscle contraction can be seen or felt 1/5 = Contraction can be felt, but there is no motion 2-/5 = Part moves through incomplete ROM w/ gravity decreased 2/5 = Part moves through complete ROM w/ gravity decreased 2+/5 = Part moves through incomplete ROM (<50%) against gravity or through complete ROM w/ gravity 3-/5 = Part moves through incomplete ROM (>50%) against gravity 3/5 = Part moves through complete ROM against gravity 3+/5 = Part moves through complete ROM against gravity/slight  resistance 4-/5= Holds test position against slight to moderate pressure 4/5 = Part moves through complete ROM against gravity/moderate resistance 4+/5= Holds test position against moderate to strong pressure 5/5 = Part moves through complete ROM against gravity/full resistance  BED MOBILITY:  Findings: Sit to supine Min A for B LE management onto mat Supine to sit Min A for trunk upright with continued impaired motor plan Pt doesn't recall previously trained logroll technique to increase her independence, could be due to impaired carryover of technique in the clinic space Caregiver reports bed mobility has improved slightly at home  Pt with significant difficulty performing anterior scooting towards EOM requiring mod A for this   TRANSFERS: Sit to stand: Min A  Assistive device utilized: Environmental consultant - 2 wheeled     Stand to sit: Min A  Assistive device utilized: Environmental consultant - 2 wheeled     Chair to chair: Min A  Assistive device utilized: Environmental consultant - 2 wheeled      *continues to require cuing to turn fully prior to initiating sitting   RAMP:  Not tested  CURB:  Not tested  STAIRS: Not tested GAIT: Findings: Gait Characteristics: step to pattern, step through pattern, decreased step length- Right, decreased step length- Left, decreased stride length, decreased hip/knee flexion- Right, decreased hip/knee flexion- Left, decreased ankle dorsiflexion- Right, decreased ankle dorsiflexion- Left, trunk flexed, narrow BOS, poor foot clearance- Right, and poor foot clearance- Left, Distance walked: ~73ft x2, Assistive device utilized:Walker - 2 wheeled, Level of assistance: Min A, and Comments: severely decreased gait speed  FUNCTIONAL TESTS:  5 times sit to stand: 83 seconds Timed up and go (TUG):  93min54 seconds using RW with min A 10 meter walk test: needs to be assessed  PATIENT SURVEYS:  PsFS: average: 3.67  Patient will completely turn 90degrees prior to sitting down during transfers with  minimal cuing: 3  Patient will be able to maintain standing balance using B UE support on RW with only SBA while aide provides total A for LB clothing or ADLs: 5 Patient will stand with erect/upright posture when walking: 3  TREATMENT DATE: 05/02/2024   ***  Pt arrives to therapy in her personal transport chair. Caregiver, [name], present.   L stand pivot transport chair>EOM using RW with light min A for lifting to stand then CGA/light min A for steadying when turning min cuing to turn fully and step back towards seat keeping AD with her, prior to initiating sitting   Repeated sit>stands from elevated EOM>RW using Blaze Pods set on a sequence to promote anterior trunk flexion when initiating rising to stand followed by Pod placed high to promote upright posture  x6 reps + 10 reps Requires occasional cuing to recall to push-up from EOM rather than pulling up on RW (pt reaching with 1 hand towards Pod) Goal of promoting increased forward trunk flexion/hip hinge followed by hip extension and upright posture in standing to reach tall target Started with 22in height mat, changed to 21in to replicate height of her transport chair seat with slight difficulty initially, but improved with repetition Skilled min A for lifting throughout With fatigue pt starts to demo more crouched posture in standing with excessive hip/knee flexion   Standing with L UE support on RW during reaching overhead task to promote increaed trunk/hip/knee extension for improved upright posture via placing cones on top of punching bag plate - standing x3 reps placing 5 cones in a stack to progressively increase upright challenge - skilled min A for balance      In // bars performed:  Forward gait focused on reciprocal stepping pattern with B UE support  down/back 1 lap + 1 lap (seated break  between) Skilled Min A for balance and cuing for increased hip/knee extension during stance to improve upright  Limited ability to improve today, may be due to fatigue Pt having more narrow BOS during gait in // bars today, cuing to improve Pt overall having increased fatigue today resulting in decreased laps able to perform prior to seated rest break and decreased ability to improve technique with cuing  ***  PATIENT EDUCATION: Education details: Pt educated throughout session about proper posture and technique with exercises. Improved exercise technique, movement at target joints, use of target muscles after min to mod verbal, visual, tactile cues.  Person educated: Patient and Caregiver [name] Education method: Explanation Education comprehension: verbalized understanding and needs further education  HOME EXERCISE PROGRAM:  Access Code: [email or web address] URL: [email or web address] Date: 12/20/2023 Prepared by: Connell Kiss   Exercises - Sit to Stand with Armchair  - 1 x daily - 7 x weekly - 3 sets - 5 reps - Standing March with Counter Support  - 1 x daily - 7 x weekly - 2 sets - 10 reps - Supine Bridge  - 1 x daily - 7 x weekly - 2 sets - 10 reps - Supine Heel Slide with Strap  - 1 x daily - 7 x weekly - 2 sets - 10 reps - Supine Hip Abduction AROM  - 1 x daily - 7 x weekly - 3 sets - 10 reps - Seated Long Arc Quad  - 1 x daily - 7 x weekly - 3 sets - 10 reps - Seated March  - 1 x daily - 7 x weekly - 3 sets - 10 reps - 2 hold - Seated Hip Abduction with Resistance  - 1 x daily - 7 x weekly - 2 sets - 10 reps   GOALS: Goals reviewed with patient? Yes  SHORT TERM GOALS: Target date: 04/18/2024  Patient will be independent in home exercise  program to improve strength/mobility for better functional independence with ADLs.  Baseline: need to initiate Goal status: INITIAL   LONG TERM GOALS: Target date: 05/30/2024  Patient will perform bed mobility with consistently no more  than min A utilizing bed features/supports as needed and with no more than than min cuing from aide/caregiver.  Baseline: requires up to mod A and mod/max verbal cuing Goal status: INITIAL  2.  Patient will increase PsFS score to equal to or greater than and average of 2 points to demonstrate statistically significant improvement in mobility and quality of life.  Baseline: 3.667 Goal status: INITIAL  3.  Patient (> 74 years old) will complete five times sit to stand test in < 30 seconds indicating an increased LE strength and improved balance.  Baseline: 83 seconds from her transport chair with RW in front of her and min progressed to light mod A  Goal status: INITIAL   4.  Patient will reduce timed up and go to <60 sec seconds to reduce fall risk and demonstrate improved transfer/gait ability.  Baseline: 86min54 seconds using RW with min A  Goal status: INITIAL  5.  Patient will increase 10 meter walk test by 0.15m/s as to improve gait speed for better household level ambulation and to reduce fall risk.  Baseline: 0.076m/s with RW  Goal status: INITIAL   ASSESSMENT:  CLINICAL IMPRESSION: *** Therapy session focused on B LE functional strengthening and improved sequencing of body mechanics during sit<>stand transfers. Patient requires skilled min A during sit<>stands to initially clear hips from seat, but then can power up remainder of distance into standing, from transport chair height. Patient overall with increased fatigue today due to having appointments prior to therapy session. Patient participated in gait training in // bars; however, due to fatigue pt with decreased ability to power up into improved trunk/hip/knee extension and sustain it today. Ms. Asper will benefit from further skilled PT to improve these deficits in order to increase QOL and ease/safety with ADLs.   OBJECTIVE IMPAIRMENTS: Abnormal gait, decreased activity tolerance, decreased balance, decreased cognition,  decreased coordination, decreased endurance, decreased knowledge of condition, decreased knowledge of use of DME, decreased mobility, difficulty walking, decreased ROM, decreased strength, impaired flexibility, impaired sensation, impaired UE functional use, and pain.   ACTIVITY LIMITATIONS: carrying, lifting, sitting, standing, transfers, bed mobility, continence, bathing, toileting, dressing, self feeding, reach over head, hygiene/grooming, and locomotion level  PARTICIPATION LIMITATIONS: meal prep, cleaning, laundry, shopping, and community activity  PERSONAL FACTORS: Age, Time since onset of injury/illness/exacerbation, and 3+ comorbidities: hypothyroidism, recurrent UTIs, osteoarthritis, prior anterior trochanter fracture, CKD stage IIIb, chronic systolic heart failure, dementia, hypertension, history of cardiac pacemaker, history of normal pressure hydrocephalus, overreactive bladder are also affecting patient's functional outcome.   REHAB POTENTIAL: Good  CLINICAL DECISION MAKING: Evolving/moderate complexity  EVALUATION COMPLEXITY: Moderate  PLAN:  PT FREQUENCY: 1-2x/week  PT DURATION: 12 weeks  PLANNED INTERVENTIONS: [id number]- PT Re-evaluation, 97750- Physical Performance Testing, 97110-Therapeutic exercises, 97530- Therapeutic activity, 97112- Neuromuscular re-education, 97535- Self Care, [id number]- Manual therapy, [phone]- Gait training, [phone]- Canalith repositioning, Patient/Family education, Balance training, Stair training, Joint mobilization, Vestibular training, Visual/preceptual remediation/compensation, Cognitive remediation, DME instructions, Cryotherapy, Moist heat, and Biofeedback  PLAN FOR NEXT SESSIONS:  *** - continue gait training and functional B LE strengthening in // bars - hip flexor stretch - supine bridges - sit<>stands  - try weighted vest? - standing reaching overhead/upright for improved hip extension and upright posture - gait training - standing marches/hip  flexion  for improved foot clearance  - add weights if/when appropriate    Merial Moritz, PT, DPT, NCS, CSRS Physical Therapist - [facility]  12:57 PM 05/02/24

## 2024-05-07 ENCOUNTER — Ambulatory Visit

## 2024-05-07 ENCOUNTER — Ambulatory Visit: Admitting: Physical Therapy

## 2024-05-07 DIAGNOSIS — R269 Unspecified abnormalities of gait and mobility: Secondary | ICD-10-CM

## 2024-05-07 DIAGNOSIS — R278 Other lack of coordination: Secondary | ICD-10-CM

## 2024-05-07 DIAGNOSIS — M6281 Muscle weakness (generalized): Secondary | ICD-10-CM

## 2024-05-07 DIAGNOSIS — R262 Difficulty in walking, not elsewhere classified: Secondary | ICD-10-CM

## 2024-05-07 DIAGNOSIS — R5381 Other malaise: Secondary | ICD-10-CM

## 2024-05-07 DIAGNOSIS — R2681 Unsteadiness on feet: Secondary | ICD-10-CM

## 2024-05-07 NOTE — Therapy (Signed)
 OUTPATIENT PHYSICAL THERAPY NEURO TREATMENT   Patient Name: Kerri [name] MRN: [id number] DOB:Mar 15, 1931, 89 y.o., female Today's Date: 05/07/2024   PCP: Cyrus Selinda Moose, PA-C  REFERRING PROVIDER: Cyrus Selinda Moose, PA-C   END OF SESSION:   PT End of Session - 05/07/24 1305     Visit Number 8    Number of Visits 24    Date for PT Re-Evaluation 05/30/24    Authorization Type [employer] [id number] for 24 PT vsts from 7/21-10/13 (7/28 is 1 of 24)    PT Start Time 1320    PT Stop Time 1401    PT Time Calculation (min) 41 min    Equipment Utilized During Treatment Gait belt    Activity Tolerance Patient tolerated treatment well;No increased pain    Behavior During Therapy WFL for tasks assessed/performed                Past Medical History:  Diagnosis Date   Arthritis    Chronic systolic heart failure (HCC)    CKD (chronic kidney disease), stage III (HCC)    Dementia (HCC)    Hypertension    Hypothyroidism    Overactive bladder    Past Surgical History:  Procedure Laterality Date   INTRAMEDULLARY (IM) NAIL INTERTROCHANTERIC Left 09/06/2022   Procedure: INTRAMEDULLARY (IM) NAIL INTERTROCHANTERIC;  Surgeon: Tobie Priest, MD;  Location: [facility] ORS;  Service: Orthopedics;  Laterality: Left;   PACEMAKER LEADLESS INSERTION N/A 01/07/2021   Procedure: PACEMAKER LEADLESS INSERTION;  Surgeon: Ammon Blunt, MD;  Location: [facility] IN[facility]IVE [facility];  Service: Cardiovascular;  Laterality: N/A;   PPM GENERATOR REMOVAL N/A 01/07/2021   Procedure: PPM GENERATOR REMOVAL;  Surgeon: Ammon Blunt, MD;  Location: [facility];  Service: Cardiovascular;  Laterality: N/A;   Patient Active Problem List   Diagnosis Date Noted   AKI (acute kidney injury) (HCC) 01/12/2024   Acute postoperative anemia due to expected blood loss 09/07/2022   Displaced intertrochanteric fracture of left femur, initial encounter for closed fracture (HCC) 09/05/2022   Orthostatic  hypotension 07/12/2022   Diarrhea 07/10/2022   COVID-19 virus infection 07/08/2022   Electrolyte abnormality 07/08/2022   Malnutrition of moderate degree 07/08/2022   Acute delirium 07/08/2022   History of recurrent UTI (urinary tract infection) 01/30/2022   Constipation    UTI (urinary tract infection) 10/15/2021   Elevated brain natriuretic peptide (BNP) level 10/15/2021   Generalized weakness 10/15/2021   Chronic kidney disease, stage 3b (HCC) 10/15/2021   Normal pressure hydrocephalus (HCC) 10/15/2021   Dementia without behavioral disturbance (HCC) 10/15/2021   Hypothyroidism 10/15/2021   Essential hypertension 10/15/2021   Overactive bladder 10/15/2021   Moderate mitral regurgitation 01/13/2021   Status post placement of cardiac pacemaker 01/13/2021   CHB (complete heart block) (HCC) 01/07/2021   Chronic systolic CHF (congestive heart failure) (HCC) 12/17/2020   Postmenopausal osteoporosis 08/26/2016   Mixed Alzheimer's and vascular dementia (HCC) 06/13/2014    ONSET DATE: Progressive decline over ~1-1.5 years  REFERRING DIAG:  M51.369 (ICD-10-CM) - Other intervertebral disc degeneration, lumbar region without mention of lumbar back pain or lower extremity pain  R53.81 (ICD-10-CM) - Other malaise  M62.81 (ICD-10-CM) - Muscle weakness (generalized)    THERAPY DIAG:  Muscle weakness (generalized)  Other lack of coordination  Debility  Difficulty in walking, not elsewhere classified  Abnormality of gait and mobility  Unsteadiness on feet  Rationale for Evaluation and Treatment: Rehabilitation  SUBJECTIVE:  SUBJECTIVE STATEMENT:  Pt reports she has been doing good. Caregiver reports no significant changes. Caregiver reports patient continues to have greatest challenge with  standing up straight/tall. Denies falls/stumbles.     From Eval: Patient previously known to this clinic. Patient with recent short hospitalization following AKI superimposed on CKI stage III with UTI. Pt received HHPT prior to returning to OPPT.  [name], caregiver, reports having taken patient to MD due to pt having decreased interest in doing things and then MD found out pt's kidney levels were low, resulting in her hospitalization.   Denies pain. Denies falls/stumbles at home.  Pt accompanied by: caregiver, [name]   PERTINENT HISTORY:  PMH: hypothyroidism, recurrent UTIs, osteoarthritis, prior anterior trochanter fracture, CKD stage IIIb, chronic systolic heart failure, dementia, hypertension, history of cardiac pacemaker, history of normal pressure hydrocephalus, overreactive bladder   Most recent A&P from Neurology note on 12/21/2023: Mixed dementia (Alzheimer's disease + vascular dementia) with significant ventriculomegaly (with negative lumbar drain trial in 2015) in a patient with history of sepsis secondary to UTI treated with trimethoprim  January 2023. Medication appears to help with UTI and improved mental status - reports good control with current medication Progressive behavioral disturbances with mood swings, outbursts, and apathy, exacerbated during the day. Seroquel (quetiapine) was initiated but caused significant lethargy. Alternative medications, lamotrigine and Depakote, were discussed for her mood-stabilizing properties and different tolerability profiles. - Discuss with primary care provider about reducing Seroquel dose to assess tolerability. Consider 1/2 tablet (12.5 mg) instead.  - If unable to tolerate Seroquel at lower dose, will consider trial of Depakote Valproic Acid is an antiepileptic drug, used for many different conditions. Patient was advised not to stop medication abruptly, not to use alcohol. Acute side-effects can be abdominal pain, N/V, diarrhea and increased  ammonia level. Long term side-effects are hair loss, weight gain, tremors and amenorrhea. Rarely, it can cause low platelets, liver failure and birth defects.  - Consider alternative medications such as lamotrigine or Depakote if Seroquel remains intolerable. - Document alternative medications in the chart for future reference.  Fatigue and somnolence Increased fatigue and somnolence potentially related to Seroquel, hypothyroidism, and medication side effects.  - Monitor heart rate and adjust medications if fatigue persists and heart rate remains low.  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 Symptoms include leg pain, particularly in the right leg, and dragging of the leg during ambulation. Physical therapy has been beneficial. Her symptoms arte chronic. She has had a reportedly negative lower extremity doppler, unavailable for my review.  Right leg pain with inability to pick up feet (right>left - likely from ventriculomegaly + peripheral neuropathy) patient with history of lumbar disc disease with stenosis - reports improvement during recent trial of prednisone. Suspicious for lumbar Disc Disease in addition to decondition Reviewed PCP note: Ongoing leg pain with intermittent weakness. On exam she does have some swelling around the right knee. Unsure if she could have some severe arthritis catching as she walks. Will obtain x-ray of the right knee today. Trial of prednisone 20 mg once a day for 5 days. We did discuss that she has some degenerative lumbar disc disease with stenosis and likely playing a role in right leg weakness. She has a pacemaker and we will defer MRI of the lumbar spine. Keep follow-up with neurology and if they feel it is necessary they could possibly pursue nerve conduction study. Inform caretaker to help patient and stay by  her side when walking.   - Patient says that  her legs feel heavy and that she has difficulty initiating the first step when walking - consistent with magnetic gait in patient with hydrocephalus and brain atrophy - Previous (-) Lumbar Puncture trial at [facility] (01/09/14). - Continue physical therapy to manage symptoms and improve mobility. - strict Emergency Room/return precautions    PAIN:  Are you having pain? No  PRECAUTIONS: Fall and ICD/Pacemaker  RED FLAGS: None and hx of spinal stenosis   WEIGHT BEARING RESTRICTIONS: No  FALLS: Has patient fallen in last 6 months? No  LIVING ENVIRONMENT: Lives with: caregiver 7 days a week. Alone for an hour or 2 a day, but only when alseep.   Lives in: House/apartment Stairs: Yes: Internal: 1 but ramp in place steps; none and External: 1 steps; none Has following equipment at home: Walker - 2 wheeled; transport chair; shower chair and grab bars   PLOF: Needs assistance with ADLs, Needs assistance with gait, Needs assistance with transfers, and Needs assistance with ambulation using RW - Primarily uses transport chair for mobility, but will ambulate with assistance to/from bathroom using RW in the home  PATIENT GOALS: Help her walking be easier and help her get stronger   OBJECTIVE:  Note: Objective measures were completed at Evaluation unless otherwise noted.  DIAGNOSTIC FINDINGS:   EXAM (from 10/28/2022): CT LUMBAR SPINE WITHOUT CONTRAST IMPRESSION: 1. Transitional anatomy. Adhering to convention from the prior lumbar spine MRI, the last fully formed disc space is labeled L5-S1. 2. Unchanged chronic compression fractures of L4 and L5. No new fracture of the lumbar spine. 3. Unchanged lumbar spondylosis notable for moderate narrowing of the right lateral recess at L1-2 and L2-3, as well as mild spinal canal stenosis at L2-3 and L3-4.  Electronically Signed   By: Ryan Chess M.D.   On: 10/28/2022 16:42   EXAM (from 09/09/2022): CT HEAD WITHOUT CONTRAST  COMPARISON:  CT head 09/05/2022, 08/15/2021   IMPRESSION: 1. No acute abnormality and no change from prior studies. 2. Atrophy and chronic microvascular ischemic change in the white matter.   Electronically Signed   By: Carlin Gaskins M.D.   On: 09/09/2022 11:50    COGNITION: Overall cognitive status: History of cognitive impairments - at baseline   SENSATION: Light touch: WFL on screen, but pt with hx of neuropathy in B LEs  COORDINATION: Not formally assessed, likely impacted by strength deficits as noted below  EDEMA:  Not formally assessed, but no significant edema noted  MUSCLE TONE: Not formally assessed   MUSCLE LENGTH: Not formally assessed   DTRs:  Not formally assessed   POSTURE: rounded shoulders, forward head, increased thoracic kyphosis, posterior pelvic tilt, and flexed trunk    LOWER EXTREMITY ROM:     Active  Right Eval Left Eval  Hip flexion Limited AROM by weakness Limited AROM by weakness (more impaired than R)  Hip extension Decreased ROM Decreased ROM  Hip abduction    Hip adduction    Hip internal rotation    Hip external rotation    Knee flexion [facility]  Knee extension [facility]  Ankle dorsiflexion [facility]  Ankle plantarflexion [facility] WFL  Ankle inversion    Ankle eversion     (Blank rows = not tested)  LOWER EXTREMITY MMT:    MMT Right Eval Left Eval  Hip flexion 3+ 3-  Hip extension    Hip abduction    Hip adduction    Hip  internal rotation    Hip external rotation    Knee flexion 4- 4-  Knee extension 4- 4-  Ankle dorsiflexion 4- 3-  Ankle plantarflexion 3+ 4-  Ankle inversion    Ankle eversion    (Blank rows = not tested)  Manual Muscle Test Scale 0/5 = No muscle contraction can be seen or felt 1/5 = Contraction can be felt, but there is no motion 2-/5 = Part moves through incomplete ROM w/ gravity decreased 2/5 = Part moves through complete ROM w/ gravity decreased 2+/5 = Part moves through incomplete ROM (<50%) against gravity or  through complete ROM w/ gravity 3-/5 = Part moves through incomplete ROM (>50%) against gravity 3/5 = Part moves through complete ROM against gravity 3+/5 = Part moves through complete ROM against gravity/slight resistance 4-/5= Holds test position against slight to moderate pressure 4/5 = Part moves through complete ROM against gravity/moderate resistance 4+/5= Holds test position against moderate to strong pressure 5/5 = Part moves through complete ROM against gravity/full resistance  BED MOBILITY:  Findings: Sit to supine Min A for B LE management onto mat Supine to sit Min A for trunk upright with continued impaired motor plan Pt doesn't recall previously trained logroll technique to increase her independence, could be due to impaired carryover of technique in the clinic space Caregiver reports bed mobility has improved slightly at home  Pt with significant difficulty performing anterior scooting towards EOM requiring mod A for this   TRANSFERS: Sit to stand: Min A  Assistive device utilized: Environmental consultant - 2 wheeled     Stand to sit: Min A  Assistive device utilized: Environmental consultant - 2 wheeled     Chair to chair: Min A  Assistive device utilized: Environmental consultant - 2 wheeled      *continues to require cuing to turn fully prior to initiating sitting   RAMP:  Not tested  CURB:  Not tested  STAIRS: Not tested GAIT: Findings: Gait Characteristics: step to pattern, step through pattern, decreased step length- Right, decreased step length- Left, decreased stride length, decreased hip/knee flexion- Right, decreased hip/knee flexion- Left, decreased ankle dorsiflexion- Right, decreased ankle dorsiflexion- Left, trunk flexed, narrow BOS, poor foot clearance- Right, and poor foot clearance- Left, Distance walked: ~67ft x2, Assistive device utilized:Walker - 2 wheeled, Level of assistance: Min A, and Comments: severely decreased gait speed  FUNCTIONAL TESTS:  5 times sit to stand: 83 seconds Timed up and go  (TUG):  14min54 seconds using RW with min A 10 meter walk test: needs to be assessed  PATIENT SURVEYS:  PsFS: average: 3.67  Patient will completely turn 90degrees prior to sitting down during transfers with minimal cuing: 3  Patient will be able to maintain standing balance using B UE support on RW with only SBA while aide provides total A for LB clothing or ADLs: 5 Patient will stand with erect/upright posture when walking: 3  TREATMENT DATE: 05/07/2024   Pt arrives to therapy in her personal transport chair. Caregiver, [name], present.  In // bars with B UE support, performed:  Forward gait focused on reciprocal stepping pattern with B UE support  down/back 3 laps + 3 laps (seated break between) Skilled Min A for balance and cuing for increased hip/knee extension during stance to improve upright posture using mirror feedback Pt continues to have very narrow BOS, cuing to improve, but limited ability to correct Performed this at beginning of session when pt with increased energy levels allowing increased success of performing hip/knee/trunk extension for upright posture Side stepping 2 laps Pt having increasing fatigue at this time resulting in more flexed trunk/hip/knee posture especially towards end  Cuing for longer step lengths, no significant difference between sides as both are challenging with poor foot clearance bilaterally Alternating foot taps to brown 6 step using B UE support Mirror feedback with cuing to maintain upright trunk/hip/knee extension - cued between every foot tap to stand tall X8-10 reps each LE Significantly more difficult to lift R LE to tap it onto the step compared to L LE Added 2lb AW on each LE  X8reps  *requires skilled min A throughout all gait interventions in // bars for balance/safety, despite B UE support   Gait training  ~32ft using RW with skilled min A for balance and cuing for improved gait mechanics and AD management:  With fatigue starts to push AD too far in front of her with excessive forward trunk flexed posture  Continues to have more impaired R LE swing phase advancement lacking foot clearance with weakness in hip flexors compared to L LE Cuing to step towards front wheels of RW as external targets to increase step lengths Caregiver providing w/c follow to allow increased distance    PATIENT EDUCATION: Education details: Pt educated throughout session about proper posture and technique with exercises. Improved exercise technique, movement at target joints, use of target muscles after min to mod verbal, visual, tactile cues.  Person educated: Patient and Caregiver [name] Education method: Explanation Education comprehension: verbalized understanding and needs further education  HOME EXERCISE PROGRAM:  Access Code: [email or web address] URL: [email or web address] Date: 12/20/2023 Prepared by: Connell Kiss   Exercises - Sit to Stand with Armchair  - 1 x daily - 7 x weekly - 3 sets - 5 reps - Standing March with Counter Support  - 1 x daily - 7 x weekly - 2 sets - 10 reps - Supine Bridge  - 1 x daily - 7 x weekly - 2 sets - 10 reps - Supine Heel Slide with Strap  - 1 x daily - 7 x weekly - 2 sets - 10 reps - Supine Hip Abduction AROM  - 1 x daily - 7 x weekly - 3 sets - 10 reps - Seated Long Arc Quad  - 1 x daily - 7 x weekly - 3 sets - 10 reps - Seated March  - 1 x daily - 7 x weekly - 3 sets - 10 reps - 2 hold - Seated Hip Abduction with Resistance  - 1 x daily - 7 x weekly - 2 sets - 10 reps   GOALS: Goals reviewed with patient? Yes  SHORT TERM GOALS: Target date: 04/18/2024  Patient will be independent in home exercise program to improve strength/mobility for better functional independence with ADLs.  Baseline: need to initiate Goal status: INITIAL   LONG TERM GOALS: Target date:  05/30/2024  Patient will perform  bed mobility with consistently no more than min A utilizing bed features/supports as needed and with no more than than min cuing from aide/caregiver.  Baseline: requires up to mod A and mod/max verbal cuing Goal status: INITIAL  2.  Patient will increase PsFS score to equal to or greater than and average of 2 points to demonstrate statistically significant improvement in mobility and quality of life.  Baseline: 3.667 Goal status: INITIAL  3.  Patient (> 64 years old) will complete five times sit to stand test in < 30 seconds indicating an increased LE strength and improved balance.  Baseline: 83 seconds from her transport chair with RW in front of her and min progressed to light mod A  Goal status: INITIAL   4.  Patient will reduce timed up and go to <60 sec seconds to reduce fall risk and demonstrate improved transfer/gait ability.  Baseline: 5min54 seconds using RW with min A  Goal status: INITIAL  5.  Patient will increase 10 meter walk test by 0.19m/s as to improve gait speed for better household level ambulation and to reduce fall risk.  Baseline: 0.044m/s with RW  Goal status: INITIAL   ASSESSMENT:  CLINICAL IMPRESSION:  Therapy session focused on gait training in // bars with mirror feedback to promote increased trunk/hip/knee extension for improved upright posture. Patient continues to demonstrate difficulty sustaining extension throughout gait with gradual progression into crouched posture. Patient also continues to demo narrow BOS with limited ability to correct for this today. Patient able to participate in alternating foot taps on step with progression to adding AWs on each LE for increased motor recruitment and strength training, recommend continued use of AWs. Patient will benefit from continuation of these interventions to promote extensor muscle group strengthening and increased endurance to allow pt improved ability to participate in standing  and ambulatory functional mobility tasks. Ms. Schweizer will benefit from further skilled PT to improve these deficits in order to increase QOL and ease/safety with ADLs.   OBJECTIVE IMPAIRMENTS: Abnormal gait, decreased activity tolerance, decreased balance, decreased cognition, decreased coordination, decreased endurance, decreased knowledge of condition, decreased knowledge of use of DME, decreased mobility, difficulty walking, decreased ROM, decreased strength, impaired flexibility, impaired sensation, impaired UE functional use, and pain.   ACTIVITY LIMITATIONS: carrying, lifting, sitting, standing, transfers, bed mobility, continence, bathing, toileting, dressing, self feeding, reach over head, hygiene/grooming, and locomotion level  PARTICIPATION LIMITATIONS: meal prep, cleaning, laundry, shopping, and community activity  PERSONAL FACTORS: Age, Time since onset of injury/illness/exacerbation, and 3+ comorbidities: hypothyroidism, recurrent UTIs, osteoarthritis, prior anterior trochanter fracture, CKD stage IIIb, chronic systolic heart failure, dementia, hypertension, history of cardiac pacemaker, history of normal pressure hydrocephalus, overreactive bladder are also affecting patient's functional outcome.   REHAB POTENTIAL: Good  CLINICAL DECISION MAKING: Evolving/moderate complexity  EVALUATION COMPLEXITY: Moderate  PLAN:  PT FREQUENCY: 1-2x/week  PT DURATION: 12 weeks  PLANNED INTERVENTIONS: 97164- PT Re-evaluation, 97750- Physical Performance Testing, 97110-Therapeutic exercises, 97530- Therapeutic activity, 97112- Neuromuscular re-education, 97535- Self Care, [id number]- Manual therapy, [phone]- Gait training, [phone]- Canalith repositioning, Patient/Family education, Balance training, Stair training, Joint mobilization, Vestibular training, Visual/preceptual remediation/compensation, Cognitive remediation, DME instructions, Cryotherapy, Moist heat, and Biofeedback  PLAN FOR NEXT SESSIONS:    - continue gait training and functional B LE strengthening in // bars  - try theraband around hips for increased hip extension? - hip flexor stretch - supine bridges - sit<>stands  - try weighted vest? - standing reaching overhead/upright for improved hip extension and upright posture -  gait training - standing marches/hip flexion for improved foot clearance  - continue use of AWs (2lbs on 7/28)    Aedan Geimer, PT, DPT, NCS, CSRS Physical Therapist - [facility]  [facility]  6:18 PM 05/07/24

## 2024-05-09 ENCOUNTER — Ambulatory Visit: Admitting: Physical Therapy

## 2024-05-09 DIAGNOSIS — M6281 Muscle weakness (generalized): Secondary | ICD-10-CM

## 2024-05-09 DIAGNOSIS — R5381 Other malaise: Secondary | ICD-10-CM

## 2024-05-09 DIAGNOSIS — R2681 Unsteadiness on feet: Secondary | ICD-10-CM

## 2024-05-09 DIAGNOSIS — R278 Other lack of coordination: Secondary | ICD-10-CM

## 2024-05-09 DIAGNOSIS — R262 Difficulty in walking, not elsewhere classified: Secondary | ICD-10-CM

## 2024-05-09 DIAGNOSIS — R269 Unspecified abnormalities of gait and mobility: Secondary | ICD-10-CM

## 2024-05-09 NOTE — Therapy (Signed)
 OUTPATIENT PHYSICAL THERAPY NEURO TREATMENT   Patient Name: Kerri [name] MRN: [id number] DOB:10-01-1931, 89 y.o., female Today's Date: 05/09/2024   PCP: Cyrus Selinda Moose, PA-C  REFERRING PROVIDER: Cyrus Selinda Moose, PA-C   END OF SESSION:   PT End of Session - 05/09/24 1318     Visit Number 9    Number of Visits 24    Date for PT Re-Evaluation 05/30/24    Authorization Type [employer] [id number] for 24 PT vsts from 7/21-10/13 (7/30 is 2 of 24)    PT Start Time 1318    PT Stop Time 1403    PT Time Calculation (min) 45 min    Equipment Utilized During Treatment Gait belt    Activity Tolerance Patient tolerated treatment well;No increased pain    Behavior During Therapy WFL for tasks assessed/performed                 Past Medical History:  Diagnosis Date   Arthritis    Chronic systolic heart failure (HCC)    CKD (chronic kidney disease), stage III (HCC)    Dementia (HCC)    Hypertension    Hypothyroidism    Overactive bladder    Past Surgical History:  Procedure Laterality Date   INTRAMEDULLARY (IM) NAIL INTERTROCHANTERIC Left 09/06/2022   Procedure: INTRAMEDULLARY (IM) NAIL INTERTROCHANTERIC;  Surgeon: Tobie Priest, MD;  Location: [facility] ORS;  Service: Orthopedics;  Laterality: Left;   PACEMAKER LEADLESS INSERTION N/A 01/07/2021   Procedure: PACEMAKER LEADLESS INSERTION;  Surgeon: Ammon Blunt, MD;  Location: [facility];  Service: Cardiovascular;  Laterality: N/A;   PPM GENERATOR REMOVAL N/A 01/07/2021   Procedure: PPM GENERATOR REMOVAL;  Surgeon: Ammon Blunt, MD;  Location: [facility];  Service: Cardiovascular;  Laterality: N/A;   Patient Active Problem List   Diagnosis Date Noted   AKI (acute kidney injury) (HCC) 01/12/2024   Acute postoperative anemia due to expected blood loss 09/07/2022   Displaced intertrochanteric fracture of left femur, initial encounter for closed fracture (HCC) 09/05/2022   Orthostatic  hypotension 07/12/2022   Diarrhea 07/10/2022   COVID-19 virus infection 07/08/2022   Electrolyte abnormality 07/08/2022   Malnutrition of moderate degree 07/08/2022   Acute delirium 07/08/2022   History of recurrent UTI (urinary tract infection) 01/30/2022   Constipation    UTI (urinary tract infection) 10/15/2021   Elevated brain natriuretic peptide (BNP) level 10/15/2021   Generalized weakness 10/15/2021   Chronic kidney disease, stage 3b (HCC) 10/15/2021   Normal pressure hydrocephalus (HCC) 10/15/2021   Dementia without behavioral disturbance (HCC) 10/15/2021   Hypothyroidism 10/15/2021   Essential hypertension 10/15/2021   Overactive bladder 10/15/2021   Moderate mitral regurgitation 01/13/2021   Status post placement of cardiac pacemaker 01/13/2021   CHB (complete heart block) (HCC) 01/07/2021   Chronic systolic CHF (congestive heart failure) (HCC) 12/17/2020   Postmenopausal osteoporosis 08/26/2016   Mixed Alzheimer's and vascular dementia (HCC) 06/13/2014    ONSET DATE: Progressive decline over ~1-1.5 years  REFERRING DIAG:  M51.369 (ICD-10-CM) - Other intervertebral disc degeneration, lumbar region without mention of lumbar back pain or lower extremity pain  R53.81 (ICD-10-CM) - Other malaise  M62.81 (ICD-10-CM) - Muscle weakness (generalized)    THERAPY DIAG:  Difficulty in walking, not elsewhere classified  Muscle weakness (generalized)  Other lack of coordination  Abnormality of gait and mobility  Unsteadiness on feet  Debility  Rationale for Evaluation and Treatment: Rehabilitation  SUBJECTIVE:  SUBJECTIVE STATEMENT:  Pt reports she has been doing good. Caregiver reports patient continues to have greatest challenge with standing up straight/tall. Denies falls/stumbles.  Patient reports no more pain than normal. Caregiver reports MD took patient off of her dementia medication ~2.5 weeks ago with caregiver statign pt does remember things a little better now that she is off the medication; however, states pt has been a little more feisty.   Caregiver reports pt does have hx of having lumbar cortisone injections, which would improve patient's walking temporarily, but pt hasn't had one in >83months.   From Eval: Patient previously known to this clinic. Patient with recent short hospitalization following AKI superimposed on CKI stage III with UTI. Pt received HHPT prior to returning to OPPT.  [name], caregiver, reports having taken patient to MD due to pt having decreased interest in doing things and then MD found out pt's kidney levels were low, resulting in her hospitalization.   Denies pain. Denies falls/stumbles at home.  Pt accompanied by: caregiver, [name]   PERTINENT HISTORY:  PMH: hypothyroidism, recurrent UTIs, osteoarthritis, prior anterior trochanter fracture, CKD stage IIIb, chronic systolic heart failure, dementia, hypertension, history of cardiac pacemaker, history of normal pressure hydrocephalus, overreactive bladder   Most recent A&P from Neurology note on 12/21/2023: Mixed dementia (Alzheimer's disease + vascular dementia) with significant ventriculomegaly (with negative lumbar drain trial in 2015) in a patient with history of sepsis secondary to UTI treated with trimethoprim  January 2023. Medication appears to help with UTI and improved mental status - reports good control with current medication Progressive behavioral disturbances with mood swings, outbursts, and apathy, exacerbated during the day. Seroquel (quetiapine) was initiated but caused significant lethargy. Alternative medications, lamotrigine and Depakote, were discussed for her mood-stabilizing properties and different tolerability profiles. - Discuss with primary care provider about reducing  Seroquel dose to assess tolerability. Consider 1/2 tablet (12.5 mg) instead.  - If unable to tolerate Seroquel at lower dose, will consider trial of Depakote Valproic Acid is an antiepileptic drug, used for many different conditions. Patient was advised not to stop medication abruptly, not to use alcohol. Acute side-effects can be abdominal pain, N/V, diarrhea and increased ammonia level. Long term side-effects are hair loss, weight gain, tremors and amenorrhea. Rarely, it can cause low platelets, liver failure and birth defects.  - Consider alternative medications such as lamotrigine or Depakote if Seroquel remains intolerable. - Document alternative medications in the chart for future reference.  Fatigue and somnolence Increased fatigue and somnolence potentially related to Seroquel, hypothyroidism, and medication side effects.  - Monitor heart rate and adjust medications if fatigue persists and heart rate remains low.  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 Symptoms include leg pain, particularly in the right leg, and dragging of the leg during ambulation. Physical therapy has been beneficial. Her symptoms arte chronic. She has had a reportedly negative lower extremity doppler, unavailable for my review.  Right leg pain with inability to pick up feet (right>left - likely from ventriculomegaly + peripheral neuropathy) patient with history of lumbar disc disease with stenosis - reports improvement during recent trial of prednisone. Suspicious for lumbar Disc Disease in addition to decondition Reviewed PCP note: Ongoing leg pain with intermittent weakness. On exam she does have some swelling around the right knee. Unsure if she could have some severe arthritis catching as she walks. Will obtain x-ray of the right knee today. Trial of prednisone 20 mg once a  day for 5 days. We did discuss that  she has some degenerative lumbar disc disease with stenosis and likely playing a role in right leg weakness. She has a pacemaker and we will defer MRI of the lumbar spine. Keep follow-up with neurology and if they feel it is necessary they could possibly pursue nerve conduction study. Inform caretaker to help patient and stay by her side when walking.   - Patient says that her legs feel heavy and that she has difficulty initiating the first step when walking - consistent with magnetic gait in patient with hydrocephalus and brain atrophy - Previous (-) Lumbar Puncture trial at [facility] (01/09/14). - Continue physical therapy to manage symptoms and improve mobility. - strict Emergency Room/return precautions    PAIN:  Are you having pain? No  PRECAUTIONS: Fall and ICD/Pacemaker  RED FLAGS: None and hx of spinal stenosis   WEIGHT BEARING RESTRICTIONS: No  FALLS: Has patient fallen in last 6 months? No  LIVING ENVIRONMENT: Lives with: caregiver 7 days a week. Alone for an hour or 2 a day, but only when alseep.   Lives in: House/apartment Stairs: Yes: Internal: 1 but ramp in place steps; none and External: 1 steps; none Has following equipment at home: Walker - 2 wheeled; transport chair; shower chair and grab bars   PLOF: Needs assistance with ADLs, Needs assistance with gait, Needs assistance with transfers, and Needs assistance with ambulation using RW - Primarily uses transport chair for mobility, but will ambulate with assistance to/from bathroom using RW in the home  PATIENT GOALS: Help her walking be easier and help her get stronger   OBJECTIVE:  Note: Objective measures were completed at Evaluation unless otherwise noted.  DIAGNOSTIC FINDINGS:   EXAM (from 10/28/2022): CT LUMBAR SPINE WITHOUT CONTRAST IMPRESSION: 1. Transitional anatomy. Adhering to convention from the prior lumbar spine MRI, the last fully formed disc space is labeled L5-S1. 2. Unchanged chronic compression  fractures of L4 and L5. No new fracture of the lumbar spine. 3. Unchanged lumbar spondylosis notable for moderate narrowing of the right lateral recess at L1-2 and L2-3, as well as mild spinal canal stenosis at L2-3 and L3-4.  Electronically Signed   By: Ryan Chess M.D.   On: 10/28/2022 16:42   EXAM (from 09/09/2022): CT HEAD WITHOUT CONTRAST  COMPARISON: CT head 09/05/2022, 08/15/2021   IMPRESSION: 1. No acute abnormality and no change from prior studies. 2. Atrophy and chronic microvascular ischemic change in the white matter.   Electronically Signed   By: Carlin Gaskins M.D.   On: 09/09/2022 11:50    COGNITION: Overall cognitive status: History of cognitive impairments - at baseline   SENSATION: Light touch: WFL on screen, but pt with hx of neuropathy in B LEs  COORDINATION: Not formally assessed, likely impacted by strength deficits as noted below  EDEMA:  Not formally assessed, but no significant edema noted  MUSCLE TONE: Not formally assessed   MUSCLE LENGTH: Not formally assessed   DTRs:  Not formally assessed   POSTURE: rounded shoulders, forward head, increased thoracic kyphosis, posterior pelvic tilt, and flexed trunk    LOWER EXTREMITY ROM:     Active  Right Eval Left Eval  Hip flexion Limited AROM by weakness Limited AROM by weakness (more impaired than R)  Hip extension Decreased ROM Decreased ROM  Hip abduction    Hip adduction    Hip internal rotation    Hip external rotation    Knee flexion [facility]  Knee extension [facility]  Ankle dorsiflexion [facility]  Ankle plantarflexion [facility] WFL  Ankle inversion    Ankle eversion     (Blank rows = not tested)  LOWER EXTREMITY MMT:    MMT Right Eval Left Eval  Hip flexion 3+ 3-  Hip extension    Hip abduction    Hip adduction    Hip internal rotation    Hip external rotation    Knee flexion 4- 4-  Knee extension 4- 4-  Ankle dorsiflexion 4- 3-  Ankle plantarflexion 3+ 4-  Ankle  inversion    Ankle eversion    (Blank rows = not tested)  Manual Muscle Test Scale 0/5 = No muscle contraction can be seen or felt 1/5 = Contraction can be felt, but there is no motion 2-/5 = Part moves through incomplete ROM w/ gravity decreased 2/5 = Part moves through complete ROM w/ gravity decreased 2+/5 = Part moves through incomplete ROM (<50%) against gravity or through complete ROM w/ gravity 3-/5 = Part moves through incomplete ROM (>50%) against gravity 3/5 = Part moves through complete ROM against gravity 3+/5 = Part moves through complete ROM against gravity/slight resistance 4-/5= Holds test position against slight to moderate pressure 4/5 = Part moves through complete ROM against gravity/moderate resistance 4+/5= Holds test position against moderate to strong pressure 5/5 = Part moves through complete ROM against gravity/full resistance  BED MOBILITY:  Findings: Sit to supine Min A for B LE management onto mat Supine to sit Min A for trunk upright with continued impaired motor plan Pt doesn't recall previously trained logroll technique to increase her independence, could be due to impaired carryover of technique in the clinic space Caregiver reports bed mobility has improved slightly at home  Pt with significant difficulty performing anterior scooting towards EOM requiring mod A for this   TRANSFERS: Sit to stand: Min A  Assistive device utilized: Environmental consultant - 2 wheeled     Stand to sit: Min A  Assistive device utilized: Environmental consultant - 2 wheeled     Chair to chair: Min A  Assistive device utilized: Environmental consultant - 2 wheeled      *continues to require cuing to turn fully prior to initiating sitting   RAMP:  Not tested  CURB:  Not tested  STAIRS: Not tested GAIT: Findings: Gait Characteristics: step to pattern, step through pattern, decreased step length- Right, decreased step length- Left, decreased stride length, decreased hip/knee flexion- Right, decreased hip/knee flexion-  Left, decreased ankle dorsiflexion- Right, decreased ankle dorsiflexion- Left, trunk flexed, narrow BOS, poor foot clearance- Right, and poor foot clearance- Left, Distance walked: ~92ft x2, Assistive device utilized:Walker - 2 wheeled, Level of assistance: Min A, and Comments: severely decreased gait speed  FUNCTIONAL TESTS:  5 times sit to stand: 83 seconds Timed up and go (TUG):  42min54 seconds using RW with min A 10 meter walk test: needs to be assessed  PATIENT SURVEYS:  PsFS: average: 3.67  Patient will completely turn 90degrees prior to sitting down during transfers with minimal cuing: 3  Patient will be able to maintain standing balance using B UE support on RW with only SBA while aide provides total A for LB clothing or ADLs: 5 Patient will stand with erect/upright posture when walking: 3  TREATMENT DATE: 05/09/2024   Pt arrives to therapy in her personal transport chair. Caregiver, [name], present.  In // bars with B UE support, performed:  Forward gait focused on reciprocal stepping pattern with B UE support  down/back 2 laps + 3 laps (seated break between) Attempted to provide YTB resistance around hips to promote hip extension and forward gait propulsion, but this quickly fatigues patient with her having increased trunk flexion Donned 2lb AW to each LE for 2nd gait trial  Therapist cuing right, left to sequence LE steps and sustain attention to task while promoting improved step length  Skilled Min A for balance and cuing for increased hip/knee extension during stance to improve upright posture using mirror feedback Pt responds well to mirror feedback to improve posture although does get distracted by her appearance Pt continues to have very narrow BOS (NBOS) with significant trendelenburg hip drop, especially during L stance phase contributing to NBOS   Continues to have poor R LE foot clearance Educated pt and Caregiver on performing seated hip abduction against YTB resistance (provided caregiver with tband). Educated Caregiver on how to set up exercise to prevent pt compensating with use of ankle PF to abduct knees. Side stepping over PVC pipe X5 reps wearing 2lb AW on LEs Pt having increasing fatigue at this time resulting in more flexed trunk/hip/knee posture especially towards end with pt relying on B UE support on // bar  Pt able to recognize when she is not taking an adequate step length over PVC pipe, but unable to anticipate and correct it in advance Requires at least 2 steps to get both feet over obstacle each time Cuing for longer step lengths, no significant difference between sides as both are challenging with poor foot clearance bilaterally; however, pt is able to clear target with R foot Forward/backwards stepping over PVC pipe Wearing 2lb AW X5 reps  Pt able to clear PVC pipe ~75% of the time with increased difficulty during backwards stepping over obstacle *requires skilled min A throughout all gait interventions in // bars for balance/safety, despite B UE support, and to provide facilitation for wt shifting onto stance limbs   Gait training ~40ft using RW with skilled min A for balance and cuing for improved gait mechanics and AD management:  Pt demos improved endurance without tendency to excessively lean forward on AD today Continues to have more impaired R LE swing phase advancement lacking foot clearance with weakness in hip flexors compared to L LE (in addition to L hip drop during L stance) Verbal Cuing right, left with therapist providing slight facilitation for weight shifting onto stance limb to encourage reciprocal pattern and sustained attention to task Pt with improved BOS today and more consistent reciprocal stepping pattern Caregiver providing w/c follow to allow increased distance    PATIENT  EDUCATION: Education details: Pt educated throughout session about proper posture and technique with exercises. Improved exercise technique, movement at target joints, use of target muscles after min to mod verbal, visual, tactile cues.  Person educated: Patient and Caregiver [name] Education method: Explanation Education comprehension: verbalized understanding and needs further education  HOME EXERCISE PROGRAM:  Access Code: [email or web address] URL: [email or web address] Date: 12/20/2023 Prepared by: Connell Kiss   Exercises - Sit to Stand with Armchair  - 1 x daily - 7 x weekly - 3 sets - 5 reps - Standing March with Counter Support  - 1 x daily - 7 x weekly - 2 sets - 10 reps - Supine Bridge  -  1 x daily - 7 x weekly - 2 sets - 10 reps - Supine Heel Slide with Strap  - 1 x daily - 7 x weekly - 2 sets - 10 reps - Supine Hip Abduction AROM  - 1 x daily - 7 x weekly - 3 sets - 10 reps - Seated Long Arc Quad  - 1 x daily - 7 x weekly - 3 sets - 10 reps - Seated March  - 1 x daily - 7 x weekly - 3 sets - 10 reps - 2 hold - Seated Hip Abduction with Resistance  - 1 x daily - 7 x weekly - 2 sets - 10 reps   GOALS: Goals reviewed with patient? Yes  SHORT TERM GOALS: Target date: 04/18/2024  Patient will be independent in home exercise program to improve strength/mobility for better functional independence with ADLs.  Baseline: need to initiate Goal status: INITIAL   LONG TERM GOALS: Target date: 05/30/2024  Patient will perform bed mobility with consistently no more than min A utilizing bed features/supports as needed and with no more than than min cuing from aide/caregiver.  Baseline: requires up to mod A and mod/max verbal cuing Goal status: INITIAL  2.  Patient will increase PsFS score to equal to or greater than and average of 2 points to demonstrate statistically significant improvement in mobility and quality of life.  Baseline: 3.667 Goal status: INITIAL  3.  Patient (> 85  years old) will complete five times sit to stand test in < 30 seconds indicating an increased LE strength and improved balance.  Baseline: 83 seconds from her transport chair with RW in front of her and min progressed to light mod A  Goal status: INITIAL   4.  Patient will reduce timed up and go to <60 sec seconds to reduce fall risk and demonstrate improved transfer/gait ability.  Baseline: 70min54 seconds using RW with min A  Goal status: INITIAL  5.  Patient will increase 10 meter walk test by 0.20m/s as to improve gait speed for better household level ambulation and to reduce fall risk.  Baseline: 0.066m/s with RW  Goal status: INITIAL   ASSESSMENT:  CLINICAL IMPRESSION:  Therapy session focused on gait training in // bars with mirror feedback to promote increased trunk/hip/knee extension for improved upright posture. Patient continues to demonstrate difficulty sustaining extension throughout gait with gradual progression into crouched posture. Patient also continues to demo narrow BOS, but slightly improved today. Patient able to progress to lateral side stepping and forward/backwards stepping over small obstacle (PVC pipe) today while wearing 2lb AWs to promote increased motor recruitment via error augmentation. Patient will benefit from continuation and progression of these interventions to promote both flexor and extensor muscle group strengthening and increased endurance to allow pt improved ability to participate in standing and ambulatory functional mobility tasks. Ms. Mclaurin will benefit from further skilled PT to improve these deficits in order to increase QOL and ease/safety with ADLs.   OBJECTIVE IMPAIRMENTS: Abnormal gait, decreased activity tolerance, decreased balance, decreased cognition, decreased coordination, decreased endurance, decreased knowledge of condition, decreased knowledge of use of DME, decreased mobility, difficulty walking, decreased ROM, decreased strength,  impaired flexibility, impaired sensation, impaired UE functional use, and pain.   ACTIVITY LIMITATIONS: carrying, lifting, sitting, standing, transfers, bed mobility, continence, bathing, toileting, dressing, self feeding, reach over head, hygiene/grooming, and locomotion level  PARTICIPATION LIMITATIONS: meal prep, cleaning, laundry, shopping, and community activity  PERSONAL FACTORS: Age, Time since onset of injury/illness/exacerbation,  and 3+ comorbidities: hypothyroidism, recurrent UTIs, osteoarthritis, prior anterior trochanter fracture, CKD stage IIIb, chronic systolic heart failure, dementia, hypertension, history of cardiac pacemaker, history of normal pressure hydrocephalus, overreactive bladder are also affecting patient's functional outcome.   REHAB POTENTIAL: Good  CLINICAL DECISION MAKING: Evolving/moderate complexity  EVALUATION COMPLEXITY: Moderate  PLAN:  PT FREQUENCY: 1-2x/week  PT DURATION: 12 weeks  PLANNED INTERVENTIONS: 97164- PT Re-evaluation, 97750- Physical Performance Testing, 97110-Therapeutic exercises, 97530- Therapeutic activity, 97112- Neuromuscular re-education, 97535- Self Care, [id number]- Manual therapy, [phone]- Gait training, [phone]- Canalith repositioning, Patient/Family education, Balance training, Stair training, Joint mobilization, Vestibular training, Visual/preceptual remediation/compensation, Cognitive remediation, DME instructions, Cryotherapy, Moist heat, and Biofeedback  PLAN FOR NEXT SESSIONS:    *Progress Note*  - continue gait training and functional B LE strengthening in // bars  - try theraband around hips for increased hip extension?  - continue use of 2lb AWs - continue lateral and forward/backwards stepping over PVC pipe - hip flexor stretch - supine bridges - sit<>stands  - try weighted vest? - standing reaching overhead/upright for improved hip extension and upright posture - gait training - standing marches/hip flexion for improved  foot clearance  - continue use of AWs (2lbs on 7/28)    Sharetha Newson, PT, DPT, NCS, CSRS Physical Therapist - [facility]  2:04 PM 05/09/24

## 2024-05-10 NOTE — Therapy (Signed)
 OUTPATIENT OCCUPATIONAL THERAPY NEURO TREATMENT NOTE  Patient Name: Kerri [name] MRN: [id number] DOB:[birth year], 89 y.o., female Today's Date: 05/10/2024  PCP: Selinda Cyrus Moose, PA-C REFERRING PROVIDER: Maree Jannett POUR, MD  END OF SESSION:  OT End of Session - 05/10/24 1612     Visit Number 11    Number of Visits 18    Date for OT Re-Evaluation 05/30/24    Authorization Time Period Reporting period beginning 12/12/23-04/23/24    OT Start Time 1401    OT Stop Time 1445    OT Time Calculation (min) 44 min    Equipment Utilized During Treatment wc    Activity Tolerance Patient tolerated treatment well    Behavior During Therapy WFL for tasks assessed/performed         Past Medical History:  Diagnosis Date   Arthritis    Chronic systolic heart failure (HCC)    CKD (chronic kidney disease), stage III (HCC)    Dementia (HCC)    Hypertension    Hypothyroidism    Overactive bladder    Past Surgical History:  Procedure Laterality Date   INTRAMEDULLARY (IM) NAIL INTERTROCHANTERIC Left 09/06/2022   Procedure: INTRAMEDULLARY (IM) NAIL INTERTROCHANTERIC;  Surgeon: Tobie Priest, MD;  Location: [facility];  Service: Orthopedics;  Laterality: Left;   PACEMAKER LEADLESS INSERTION N/A 01/07/2021   Procedure: PACEMAKER LEADLESS INSERTION;  Surgeon: Ammon Blunt, MD;  Location: [facility];  Service: Cardiovascular;  Laterality: N/A;   PPM GENERATOR REMOVAL N/A 01/07/2021   Procedure: PPM GENERATOR REMOVAL;  Surgeon: Ammon Blunt, MD;  Location: [facility];  Service: Cardiovascular;  Laterality: N/A;   Patient Active Problem List   Diagnosis Date Noted   AKI (acute kidney injury) (HCC) 01/12/2024   Acute postoperative anemia due to expected blood loss 09/07/2022   Displaced intertrochanteric fracture of left femur, initial encounter for closed fracture (HCC) 09/05/2022   Orthostatic hypotension 07/12/2022   Diarrhea 07/10/2022   COVID-19 virus  infection 07/08/2022   Electrolyte abnormality 07/08/2022   Malnutrition of moderate degree 07/08/2022   Acute delirium 07/08/2022   History of recurrent UTI (urinary tract infection) 01/30/2022   Constipation    UTI (urinary tract infection) 10/15/2021   Elevated brain natriuretic peptide (BNP) level 10/15/2021   Generalized weakness 10/15/2021   Chronic kidney disease, stage 3b (HCC) 10/15/2021   Normal pressure hydrocephalus (HCC) 10/15/2021   Dementia without behavioral disturbance (HCC) 10/15/2021   Hypothyroidism 10/15/2021   Essential hypertension 10/15/2021   Overactive bladder 10/15/2021   Moderate mitral regurgitation 01/13/2021   Status post placement of cardiac pacemaker 01/13/2021   CHB (complete heart block) (HCC) 01/07/2021   Chronic systolic CHF (congestive heart failure) (HCC) 12/17/2020   Postmenopausal osteoporosis 08/26/2016   Mixed Alzheimer's and vascular dementia (HCC) 06/13/2014   ONSET DATE: 09/05/2022  REFERRING DIAG: Intertrochanteric Left Femur Fracture, Lumbar Disc Disease with stenosis  THERAPY DIAG:  Other lack of coordination  Muscle weakness (generalized)  Debility  Rationale for Evaluation and Treatment: Rehabilitation  SUBJECTIVE:  SUBJECTIVE STATEMENT: Caregiver reports that she sees pt engaging the L hand more when she eats, using the hand to hold her plate or napkin. Pt accompanied by: Amandalynn Pitz-Personal Caregiver  PERTINENT HISTORY:  03/07/24:  Pt returns today to resume therapy services after recent hospitalization for AKI.   Per chart: 89 with past medical history significant for hypothyroidism, recurrent UTIs, osteoarthritis, prior anterior trochanter fracture, CKD stage IIIb, chronic systolic heart failure, dementia, hypertension, history of cardiac pacemaker, history  of normal pressure hydrocephalus, overreactive bladder. Patient was previously on suppressive Bactrim . She has been receiving workup outpatient with neurology. Neurology  requested for urinalysis but patient was unable to provide a voided specimen. She went to urology for catheter catheterization UA. At urology office reported feeling weak. Outpatient labs revealed creatinine 2.9. She was sent to the ED for further evaluation of AKI.   Pt. Is a 89 y.o. female who sustained an Intertrochanteric Left Femur Fracture, Lumbar Disc Disease with Stenosis, Chronic compression Fractures. 09/05/2022, PMHx includes: Mixed Dementia (Alzheimer's Disease, and Vascular Dementia), Pacemaker, Mild spinal canal stenosis L2-3 & L3-4, Hx of UTI.  PRECAUTIONS: Pacemaker (6-8 years)  WEIGHT BEARING RESTRICTIONS: No  PAIN: 04/23/24: Caregiver estimates really bad pain in the morning since reducing therapy to only 1x per week. Are you having pain? Yes, in the a.m. R leg, back   FALLS: Has patient fallen in last 6 months? No, 3 assisted sitdowns to the flloor- When Pt. feels like she is not able to walk any further.  LIVING ENVIRONMENT: Lives with: Carlethia Mesquita Lives in: House Stairs:  One story with a basement; 2 steps to enter from the garage. In step down to the den; and and one step ramp into the kitchen Has following equipment at home: Wheelchair (manual), walker, hospital bed with rails, and a lift chair, BCommode, and shower chair. Gait belt, Life Alert, hand held shower head.  PLOF: Independent  PATIENT GOALS:  To be able to stand, and walk to the bathroom. Left hand -Pt. Is not using her left hand much  OBJECTIVE:  Note: Objective measures were completed at Evaluation unless otherwise noted.  HAND DOMINANCE: Right  ADLs: Now, occasional min A, CGA , eating no change, set up for grooming, set up for UB dressing,same for dressing, CGA toilet transfer, bathing same, mod A to lift legs over tub while seated on chair, before hip fx, occasional help with transfers at night or if tired.  Transfers/ambulation related to ADLs:  Eating: Independent, assist with set-up for cutting  food Grooming: Independent set-up, seated  UB Dressing: Independent with set-up LB Dressing: Mod/MaxA pants, and socks Toileting: Assist with toilet transfers, clothing negotiation, Independent toilet hygiene. Some incontinence episodes Bathing: MaxA Tub Shower transfers: Max A shower transfers  IADLs: Shopping: Dependent. Will accompany at times. Light housekeeping: Occasionally wipes the table, and puts items in the trash, folds laundry when brought sitting Meal Prep: Will help assist with baking/mixing items, no cooking, meals provided for the Pt. Community mobility: Relies on family and friends Medication management: Medication management provided for the Pt. Financial management:  No change from baseline  MOBILITY STATUS: Hx of Falls, Needs assist   FUNCTIONAL OUTCOME MEASURES: TBD  UPPER EXTREMITY ROM:    Active ROM Right eval Right 03/07/24 Left eval Left 03/07/24  Shoulder flexion 119(140) 130 (143) 112(130) 117 (130)  Shoulder abduction 112 160 (170) 98 135 (155)  Shoulder adduction      Shoulder extension      Shoulder internal rotation      Shoulder external rotation      Elbow flexion WNL  WNL   Elbow extension WNL  WNL   Wrist flexion      Wrist extension WNL  WNL   Wrist ulnar deviation      Wrist radial deviation      Wrist pronation      Wrist supination      (Blank rows = not tested)  UPPER EXTREMITY MMT:  MMT Right eval Right 03/07/24 Right 04/23/24 Left eval Left 03/07/24 Left 04/23/24  Shoulder flexion 3-/5 4+/5 within available range 4+ 3-/5 3+ within available range 4-  Shoulder abduction 3-/5 4+/5 within available range 4+ 3-/5 3+ within available range 4-  Shoulder adduction        Shoulder extension        Shoulder internal rotation        Shoulder external rotation        Middle trapezius        Lower trapezius        Elbow flexion 4/5 4+ 4+ 3+/5 4+ 4+  Elbow extension 4/5 4+ 4+ 3+/5 4+ 4+  Wrist flexion     4+ 4+  Wrist  extension 4/5 4+ 4+ 3+/5 4+ 4+  Wrist ulnar deviation        Wrist radial deviation        Wrist pronation        Wrist supination        (Blank rows = not tested)  HAND FUNCTION: Grip strength: Right: 29 lbs; Left: 21 lbs; Pinch strength: Lateral: R: 10#, L: 6#, 3pt. Pinch: R: 8#, L: 5# 03/07/24: Grip strength: Right: 45 lbs; Left: 27 lbs; Pinch strength: Lateral: R: 10 lbs, L: 7 lbs, 3 point pinch: Right: 6 lbs, L: 5# 04/24/23: Grip strength: 43 lbs, Left: 25 lbs; Pinch strength: Lateral: R: 11 lbs, L: 11 lbs; 3 point pinch: Right: 9 lbs, L: 6 lbs  COORDINATION:  9 Hole Peg test: Right: 1 min. & 2  sec; Left: 2 min. & 1 sec 03/07/24: 9 hole peg test: Right: 46 sec, Left: 1 min 24 sec  04/23/24: 9 hole peg test: Right: 46 sec, Left: 58 sec   SENSATION: Light touch: Inconsistent  COGNITION: Overall cognitive status: History of cognitive impairments - at baseline Alzheimer's Dementia, and Vascular Dementia  VISION: Subjective report:  Glasses all the time   VISION ASSESSMENT: Wears glasses all the time at baseline                                                                                                            TREATMENT DATE: 05/07/24 Therapeutic Exercises: Pt seen this date for UB strengthening with use of 1# dowel.  Overhead press, chest press, abduction adduction, forward circles, backward circles and dowel climb.  10 repetitions each exercise for 2 sets patient required therapist demonstration and cues cues for proper form and technique.  Therapeutic Activity: Patient seen this date with focus on reaching and manipulation skills with use of jumbo pegs and large judy board, formulating a pattern placed on a wedge for facilitation of greater reach.  Patient responded well to moderate cues for pattern design.  PATIENT EDUCATION: Education details: Progress towards goals Person educated: Patient and Caregiver Pluma Diniz Education method: Explanation, vc Education comprehension:  verbalized understanding  HOME EXERCISE PROGRAM: -Yellow theraputty with visual handout+ -[facility] handout  GOALS: Goals reviewed with patient? Yes  SHORT TERM GOALS: Target date: 04/18/2024    Pt. Will  require Supervision for BUE HEPs Baseline: Eval: No current HEP; 03/07/24: Will re-establish HEP d/t therapy interrupted with recent hospitalization; 04/23/24: Pt no longer working with theraputty d/t putty making a mess with a blanket and clothing.  Pt has a ball she squeezes for grip strengthening.  Pt also has [facility] handout and caregiver reports she works on Chief of Staff from written handout Goal status: achieved   LONG TERM GOALS: Target date: 05/30/2024  Pt. Will increase L shoulder strength by 1 mm grade to assist with ADLs/IADLs (revised on 03/07/24). Baseline: Eval: Right: shoulder flexion 3-/5, abduction 3-/5, elbow flexion 4/5, extension 4/5, wrist extension 4/5; Left: shoulder flexion: 3-/5, abduction: 3-/5, elbow flexion 3+/5, extension 3+/5, wrist extension 3+/5; 03/07/24: L shoulder flex/abd 3+/5 within available range; 04/23/24: L shoulder grossly 4-, elbow/wrist 4+ Goal status: ongoing  2.  Pt will increase L grip strength by 5 or more lbs to assist with hiking pants (revised on 03/07/24). Baseline: R: 29#, L: 21#; 03/07/24: L grip 27#; 04/23/24: L grip 25# Goal status: INITIAL  3.  Pt. Will initiate engaging the left hand during daily activity 75% of the time with minimal cuing Baseline: Eval: Limited initiation and engagement of the LUE during daily tasks.  10-15% engaging; 03/07/24: same as eval; 04/23/24: Caregiver estimates that pt uses her hand a little more, maybe 20% of the time.  Caregiver reports that she sees pt holding a napkin when she eats.  Goal status: ongoing  4.  Pt. Will demonstrate compensatory adaptive techniques to assist with ADLs/IADLs. Baseline: Eval: Education to be provided; 03/07/24: ongoing; 04/23/24: ongoing Goal status:  ongoing  ASSESSMENT:  CLINICAL IMPRESSION: Patient seen this date with focus on strengthening and range of motion for bilateral upper extremities with emphasis on left upper extremity to increase engagement in tasks.  Patient requires therapist demonstration and verbal cues for proper form and technique.  Patient occasionally requires therapist guiding during exercises.  Patient performed well with manipulation of jumbo pegs in combination with multidirectional reaching patterns.  She did require moderate cues for pattern design and copying.  Pt. continues to benefit from OT services to work on improving LUE functioning in order to improve, and maximize participation with ADLs, and IADL tasks.  PERFORMANCE DEFICITS: in functional skills including ADLs, IADLs, coordination, dexterity, proprioception, ROM, strength, Fine motor control, Gross motor control, mobility, decreased knowledge of use of DME, and UE functional use, cognitive skills including attention, problem solving, and safety awareness, and psychosocial skills including environmental adaptation and routines and behaviors.   IMPAIRMENTS: are limiting patient from ADLs, IADLs, education, and leisure.   CO-MORBIDITIES: has  other co-morbidities that affects occupational performance. Patient will benefit from skilled OT to address above impairments and improve overall function.  MODIFICATION OR ASSISTANCE TO COMPLETE EVALUATION: Min-Moderate modification of tasks or assist with assess necessary to complete an evaluation.  OT OCCUPATIONAL PROFILE AND HISTORY: Detailed assessment: Review of records and additional review of physical, cognitive, psychosocial history related to current functional performance.  CLINICAL DECISION MAKING: Moderate - several treatment options, min-mod task modification necessary  REHAB POTENTIAL: Good  EVALUATION COMPLEXITY: Moderate  PLAN:  OT FREQUENCY: 1x/week  OT DURATION: 12 weeks  PLANNED  INTERVENTIONS: 97168 OT Re-evaluation, 97535 self care/ADL training, 02889 therapeutic exercise, 97530 therapeutic activity, 97112 neuromuscular re-education, 97140 manual therapy, 97018 paraffin, 02989 moist heat, 97034 contrast bath, passive range of motion, functional mobility training, patient/family education, and DME and/or AE instructions  RECOMMENDED OTHER SERVICES: Pt currently working with  PT  CONSULTED AND AGREED WITH PLAN OF CARE: Patient and family member/caregiver  PLAN FOR NEXT SESSION: see above   Hobie Kohles T Rayshawn Maney, OTR/L, CLT   05/10/2024, 4:13 PM

## 2024-05-14 ENCOUNTER — Ambulatory Visit: Attending: Family Medicine | Admitting: Physical Therapy

## 2024-05-14 ENCOUNTER — Ambulatory Visit

## 2024-05-14 DIAGNOSIS — M6281 Muscle weakness (generalized): Secondary | ICD-10-CM

## 2024-05-14 DIAGNOSIS — R5381 Other malaise: Secondary | ICD-10-CM | POA: Diagnosis present

## 2024-05-14 DIAGNOSIS — R278 Other lack of coordination: Secondary | ICD-10-CM

## 2024-05-14 DIAGNOSIS — R2681 Unsteadiness on feet: Secondary | ICD-10-CM | POA: Insufficient documentation

## 2024-05-14 DIAGNOSIS — R269 Unspecified abnormalities of gait and mobility: Secondary | ICD-10-CM | POA: Insufficient documentation

## 2024-05-14 DIAGNOSIS — R262 Difficulty in walking, not elsewhere classified: Secondary | ICD-10-CM | POA: Diagnosis present

## 2024-05-14 NOTE — Therapy (Signed)
 OUTPATIENT PHYSICAL THERAPY NEURO TREATMENT  Physical Therapy Progress Note   Dates of reporting period  03/07/2024   to   05/14/2024    Patient Name: Kerri [name] MRN: [id number] DOB:[birth year], 89 y.o., female Today's Date: 05/14/2024   PCP: Cyrus Selinda Moose, PA-C  REFERRING PROVIDER: Cyrus Selinda Moose, PA-C   END OF SESSION:   PT End of Session - 05/14/24 1318     Visit Number 10    Number of Visits 24    Date for PT Re-Evaluation 05/30/24    Authorization Type [employer] [id number] for 24 PT vsts from 7/21-10/13 (8/4 is 3 of 24)    PT Start Time 1318    PT Stop Time 1400    PT Time Calculation (min) 42 min    Equipment Utilized During Treatment Gait belt    Activity Tolerance Patient tolerated treatment well;No increased pain    Behavior During Therapy WFL for tasks assessed/performed           Past Medical History:  Diagnosis Date   Arthritis    Chronic systolic heart failure (HCC)    CKD (chronic kidney disease), stage III (HCC)    Dementia (HCC)    Hypertension    Hypothyroidism    Overactive bladder    Past Surgical History:  Procedure Laterality Date   INTRAMEDULLARY (IM) NAIL INTERTROCHANTERIC Left 09/06/2022   Procedure: INTRAMEDULLARY (IM) NAIL INTERTROCHANTERIC;  Surgeon: Tobie Priest, MD;  Location: [facility];  Service: Orthopedics;  Laterality: Left;   PACEMAKER LEADLESS INSERTION N/A 01/07/2021   Procedure: PACEMAKER LEADLESS INSERTION;  Surgeon: Ammon Blunt, MD;  Location: [facility];  Service: Cardiovascular;  Laterality: N/A;   PPM GENERATOR REMOVAL N/A 01/07/2021   Procedure: PPM GENERATOR REMOVAL;  Surgeon: Ammon Blunt, MD;  Location: [facility];  Service: Cardiovascular;  Laterality: N/A;   Patient Active Problem List   Diagnosis Date Noted   AKI (acute kidney injury) (HCC) 01/12/2024   Acute postoperative anemia due to expected blood loss 09/07/2022   Displaced intertrochanteric fracture of  left femur, initial encounter for closed fracture (HCC) 09/05/2022   Orthostatic hypotension 07/12/2022   Diarrhea 07/10/2022   COVID-19 virus infection 07/08/2022   Electrolyte abnormality 07/08/2022   Malnutrition of moderate degree 07/08/2022   Acute delirium 07/08/2022   History of recurrent UTI (urinary tract infection) 01/30/2022   Constipation    UTI (urinary tract infection) 10/15/2021   Elevated brain natriuretic peptide (BNP) level 10/15/2021   Generalized weakness 10/15/2021   Chronic kidney disease, stage 3b (HCC) 10/15/2021   Normal pressure hydrocephalus (HCC) 10/15/2021   Dementia without behavioral disturbance (HCC) 10/15/2021   Hypothyroidism 10/15/2021   Essential hypertension 10/15/2021   Overactive bladder 10/15/2021   Moderate mitral regurgitation 01/13/2021   Status post placement of cardiac pacemaker 01/13/2021   CHB (complete heart block) (HCC) 01/07/2021   Chronic systolic CHF (congestive heart failure) (HCC) 12/17/2020   Postmenopausal osteoporosis 08/26/2016   Mixed Alzheimer's and vascular dementia (HCC) 06/13/2014    ONSET DATE: Progressive decline over ~1-1.5 years  REFERRING DIAG:  M51.369 (ICD-10-CM) - Other intervertebral disc degeneration, lumbar region without mention of lumbar back pain or lower extremity pain  R53.81 (ICD-10-CM) - Other malaise  M62.81 (ICD-10-CM) - Muscle weakness (generalized)    THERAPY DIAG:  Difficulty in walking, not elsewhere classified  Muscle weakness (generalized)  Other lack of coordination  Abnormality of gait and mobility  Unsteadiness on feet  Debility  Rationale for Evaluation and Treatment:  Rehabilitation  SUBJECTIVE:                                                                                                                                                                                             SUBJECTIVE STATEMENT:  [name] reports patient did not have a good weekend and because of that, pt  has not waked very much. [name] states pt will not stand up straight with pt stating she just can't do it. Pt denies increased pain. Denies falls/stumbles. Pt's caregiver states pt has just refused to work on standing up straight. [name] reports patient has been off the dementia medication for ~3 weeks. [name] reports pt is still eating and drinking well with no changes in bowel/bladder.    From Prior sessions: Caregiver reports pt does have hx of having lumbar cortisone injections, which would improve patient's walking temporarily, but pt hasn't had one in >71months. Hx of L LE fx, but now reporting pain in R LE   From Eval: Patient previously known to this clinic. Patient with recent short hospitalization following AKI superimposed on CKI stage III with UTI. Pt received HHPT prior to returning to OPPT.  [name], caregiver, reports having taken patient to MD due to pt having decreased interest in doing things and then MD found out pt's kidney levels were low, resulting in her hospitalization.   Denies pain. Denies falls/stumbles at home.  Pt accompanied by: caregiver, [name]   PERTINENT HISTORY:  PMH: hypothyroidism, recurrent UTIs, osteoarthritis, prior anterior trochanter fracture, CKD stage IIIb, chronic systolic heart failure, dementia, hypertension, history of cardiac pacemaker, history of normal pressure hydrocephalus, overreactive bladder   Most recent A&P from Neurology note on 12/21/2023: Mixed dementia (Alzheimer's disease + vascular dementia) with significant ventriculomegaly (with negative lumbar drain trial in 2015) in a patient with history of sepsis secondary to UTI treated with trimethoprim  January 2023. Medication appears to help with UTI and improved mental status - reports good control with current medication Progressive behavioral disturbances with mood swings, outbursts, and apathy, exacerbated during the day. Seroquel (quetiapine) was initiated but caused significant lethargy. Alternative  medications, lamotrigine and Depakote, were discussed for her mood-stabilizing properties and different tolerability profiles. - Discuss with primary care provider about reducing Seroquel dose to assess tolerability. Consider 1/2 tablet (12.5 mg) instead.  - If unable to tolerate Seroquel at lower dose, will consider trial of Depakote Valproic Acid is an antiepileptic drug, used for many different conditions. Patient was advised not to stop medication abruptly, not to use alcohol. Acute side-effects can be abdominal pain, N/V, diarrhea and increased ammonia level. Long term side-effects are hair loss,  weight gain, tremors and amenorrhea. Rarely, it can cause low platelets, liver failure and birth defects.  - Consider alternative medications such as lamotrigine or Depakote if Seroquel remains intolerable. - Document alternative medications in the chart for future reference.  Fatigue and somnolence Increased fatigue and somnolence potentially related to Seroquel, hypothyroidism, and medication side effects.  - Monitor heart rate and adjust medications if fatigue persists and heart rate remains low.  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 Symptoms include leg pain, particularly in the right leg, and dragging of the leg during ambulation. Physical therapy has been beneficial. Her symptoms arte chronic. She has had a reportedly negative lower extremity doppler, unavailable for my review.  Right leg pain with inability to pick up feet (right>left - likely from ventriculomegaly + peripheral neuropathy) patient with history of lumbar disc disease with stenosis - reports improvement during recent trial of prednisone. Suspicious for lumbar Disc Disease in addition to decondition Reviewed PCP note: Ongoing leg pain with intermittent weakness. On exam she does have some swelling around the right knee.  Unsure if she could have some severe arthritis catching as she walks. Will obtain x-ray of the right knee today. Trial of prednisone 20 mg once a day for 5 days. We did discuss that she has some degenerative lumbar disc disease with stenosis and likely playing a role in right leg weakness. She has a pacemaker and we will defer MRI of the lumbar spine. Keep follow-up with neurology and if they feel it is necessary they could possibly pursue nerve conduction study. Inform caretaker to help patient and stay by her side when walking.   - Patient says that her legs feel heavy and that she has difficulty initiating the first step when walking - consistent with magnetic gait in patient with hydrocephalus and brain atrophy - Previous (-) Lumbar Puncture trial at [facility] (01/09/14). - Continue physical therapy to manage symptoms and improve mobility. - strict Emergency Room/return precautions    PAIN:  Are you having pain? No  PRECAUTIONS: Fall and ICD/Pacemaker  RED FLAGS: None and hx of spinal stenosis   WEIGHT BEARING RESTRICTIONS: No  FALLS: Has patient fallen in last 6 months? No  LIVING ENVIRONMENT: Lives with: caregiver 7 days a week. Alone for an hour or 2 a day, but only when alseep.   Lives in: House/apartment Stairs: Yes: Internal: 1 but ramp in place steps; none and External: 1 steps; none Has following equipment at home: Walker - 2 wheeled; transport chair; shower chair and grab bars   PLOF: Needs assistance with ADLs, Needs assistance with gait, Needs assistance with transfers, and Needs assistance with ambulation using RW - Primarily uses transport chair for mobility, but will ambulate with assistance to/from bathroom using RW in the home  PATIENT GOALS: Help her walking be easier and help her get stronger   OBJECTIVE:  Note: Objective measures were completed at Evaluation unless otherwise noted.  DIAGNOSTIC FINDINGS:   EXAM (from 10/28/2022): CT LUMBAR SPINE WITHOUT  CONTRAST IMPRESSION: 1. Transitional anatomy. Adhering to convention from the prior lumbar spine MRI, the last fully formed disc space is labeled L5-S1. 2. Unchanged chronic compression fractures of L4 and L5. No new fracture of the lumbar spine. 3. Unchanged lumbar spondylosis notable for moderate narrowing of the right lateral recess at L1-2 and L2-3, as well as mild spinal canal stenosis at L2-3 and L3-4.  Electronically Signed   By: Ryan  Wiggins M.D.   On: 10/28/2022 16:42   EXAM (from 09/09/2022): CT HEAD WITHOUT CONTRAST  COMPARISON: CT head 09/05/2022, 08/15/2021   IMPRESSION: 1. No acute abnormality and no change from prior studies. 2. Atrophy and chronic microvascular ischemic change in the white matter.   Electronically Signed   By: Carlin Gaskins M.D.   On: 09/09/2022 11:50    COGNITION: Overall cognitive status: History of cognitive impairments - at baseline   SENSATION: Light touch: WFL on screen, but pt with hx of neuropathy in B LEs  COORDINATION: Not formally assessed, likely impacted by strength deficits as noted below  EDEMA:  Not formally assessed, but no significant edema noted  MUSCLE TONE: Not formally assessed   MUSCLE LENGTH: Not formally assessed   DTRs:  Not formally assessed   POSTURE: rounded shoulders, forward head, increased thoracic kyphosis, posterior pelvic tilt, and flexed trunk    LOWER EXTREMITY ROM:     Active  Right Eval Left Eval  Hip flexion Limited AROM by weakness Limited AROM by weakness (more impaired than R)  Hip extension Decreased ROM Decreased ROM  Hip abduction    Hip adduction    Hip internal rotation    Hip external rotation    Knee flexion [facility] WFL  Knee extension [facility]  Ankle dorsiflexion [facility] WFL  Ankle plantarflexion [facility] WFL  Ankle inversion    Ankle eversion     (Blank rows = not tested)  LOWER EXTREMITY MMT:    MMT Right Eval Left Eval  Hip flexion 3+ 3-  Hip extension    Hip  abduction    Hip adduction    Hip internal rotation    Hip external rotation    Knee flexion 4- 4-  Knee extension 4- 4-  Ankle dorsiflexion 4- 3-  Ankle plantarflexion 3+ 4-  Ankle inversion    Ankle eversion    (Blank rows = not tested)  Manual Muscle Test Scale 0/5 = No muscle contraction can be seen or felt 1/5 = Contraction can be felt, but there is no motion 2-/5 = Part moves through incomplete ROM w/ gravity decreased 2/5 = Part moves through complete ROM w/ gravity decreased 2+/5 = Part moves through incomplete ROM (<50%) against gravity or through complete ROM w/ gravity 3-/5 = Part moves through incomplete ROM (>50%) against gravity 3/5 = Part moves through complete ROM against gravity 3+/5 = Part moves through complete ROM against gravity/slight resistance 4-/5= Holds test position against slight to moderate pressure 4/5 = Part moves through complete ROM against gravity/moderate resistance 4+/5= Holds test position against moderate to strong pressure 5/5 = Part moves through complete ROM against gravity/full resistance  BED MOBILITY:  Findings: Sit to supine Min A for B LE management onto mat Supine to sit Min A for trunk upright with continued impaired motor plan Pt doesn't recall previously trained logroll technique to increase her independence, could be due to impaired carryover of technique in the clinic space Caregiver reports bed mobility has improved slightly at home  Pt with significant difficulty performing anterior scooting towards EOM requiring mod A for this   TRANSFERS: Sit to stand: Min A  Assistive device utilized: Environmental consultant - 2 wheeled     Stand to sit: Min A  Assistive device utilized: Environmental consultant - 2 wheeled     Chair to chair: Min A  Assistive device utilized: Environmental consultant - 2 wheeled      *continues to require cuing to turn fully prior to  initiating sitting   RAMP:  Not tested  CURB:  Not tested  STAIRS: Not tested GAIT: Findings: Gait  Characteristics: step to pattern, step through pattern, decreased step length- Right, decreased step length- Left, decreased stride length, decreased hip/knee flexion- Right, decreased hip/knee flexion- Left, decreased ankle dorsiflexion- Right, decreased ankle dorsiflexion- Left, trunk flexed, narrow BOS, poor foot clearance- Right, and poor foot clearance- Left, Distance walked: ~69ft x2, Assistive device utilized:Walker - 2 wheeled, Level of assistance: Min A, and Comments: severely decreased gait speed  FUNCTIONAL TESTS:  5 times sit to stand: 83 seconds Timed up and go (TUG):  3min54 seconds using RW with min A 10 meter walk test: needs to be assessed  PATIENT SURVEYS:  PsFS: average: 3.67  Patient will completely turn 90degrees prior to sitting down during transfers with minimal cuing: 3  Patient will be able to maintain standing balance using B UE support on RW with only SBA while aide provides total A for LB clothing or ADLs: 5 Patient will stand with erect/upright posture when walking: 3                                                                                                                              TREATMENT DATE: 05/14/2024   Pt arrives to therapy in her personal transport chair. Caregiver, [name], present.  Therapy session focused on re-assessment of standardized outcome measures and subjective questionnaire to determine pt's progress with therapy thus far.  Caregiver reports pt is still requiring the same amount of assistance and cuing to perform bed mobility and pt is recently complaining of increased R LE pain in the morning, which has impacted her progress with this. Caregiver reports she may need to start premedicating patient with tylenol  before initiating bed mobility in the morning.   PsFS: average: 4.66 with patient having greatest improvement on stand pivot transfers            Patient will completely turn 90degrees prior to sitting down during transfers with  minimal cuing: 7/10            Patient will be able to maintain standing balance using B UE support on RW with only SBA while aide provides total A for LB clothing or ADLs: 4 Patient will stand with erect/upright posture when walking: 3   Five times Sit to Stand Test (FTSS) "Stand up and sit down as quickly as possible 5 times, keeping your arms folded across your chest."    TIME: 43.34 seconds from transport chair with RW in front of her and heavy min progressed to mod A due to lack of anterior weight shifting as she rises to stand  Times > 13.6 seconds is associated with increased disability and morbidity (Guralnik, 2000) Times > 15 seconds is predictive of recurrent falls in healthy individuals aged 92 and older (Buatois, et al., 2008) Normal performance values in community dwelling individuals aged 61 and older (Bohannon, 2006): 60-69 years:  11.4 seconds 70-79 years: 12.6 seconds 80-89 years: 14.8 seconds  MCID: >= 2.3 seconds for Vestibular Disorders (Meretta, 2006)    Participated in Timed Up and Go (TUG): 1st trial: 47 seconds with min A for AD management, especially with turning to sit at transport chair, & maintaining upright posture 2nd trial: 7 seconds with min A for AD management, maintaining upright posture, & completing turn to sit in transport chair Average: 73min57 seconds using RW with min A  Patient demonstrates high fall risk as indicated by requiring >13.5seconds to complete the TUG.    10 Meter Walk Test: Patient instructed to walk 10 meters (32.8 ft) as quickly and as safely as possible at their normal speed x2 and at a fast speed x2. Time measured from 2 meter mark to 8 meter mark to accommodate ramp-up and ramp-down.  Normal speed 1: 0.097 m/s (29min43 seconds) with RW Normal speed 2: 0.073 m/s (35min16 seconds) with RW Average Normal speed: 0.085 m/s using RW with skilled min A for balance, AD management and facilitating wt shifting onto stance  limb Cut off scores: <0.4 m/s = household Ambulator, 0.4-0.8 m/s = limited community Ambulator, >0.8 m/s = community Ambulator, >1.2 m/s = crossing a street, <1.0 = increased fall risk MCID 0.05 m/s (small), 0.13 m/s (moderate), 0.06 m/s (significant)  (ANPTA Core Set of Outcome Measures for Adults with Neurologic Conditions, 2018)   Patient continues to have fatigue with longer distance ambulation. Pt continues to demo poor R LE foot clearance and step length compared to L LE; however, pt now able to achieve a few bouts of ~3-4 reciprocal steps with improved R LE foot clearance and only min-mod cuing rather than mod-max cuing. Pt demos improving AD management, not pushing it too far in front of her until she becomes fatigued; however, does still have difficulty turning fully when turning to sit in seat (does well with 90degree turns, but more challenged with 180degree turns).  Caregiver expresses the need for patient to work on ambulating in tight spaces where RW will not fit because pt's AD will not fit in her bathroom at home.     PATIENT EDUCATION: Education details: Pt educated throughout session about proper posture and technique with exercises. Improved exercise technique, movement at target joints, use of target muscles after min to mod verbal, visual, tactile cues.  Person educated: Patient and Caregiver [name] Education method: Explanation Education comprehension: verbalized understanding and needs further education  HOME EXERCISE PROGRAM:  Access Code: [email or web address] URL: [email or web address] Date: 12/20/2023 Prepared by: Connell Kiss   Exercises - Sit to Stand with Armchair  - 1 x daily - 7 x weekly - 3 sets - 5 reps - Standing March with Counter Support  - 1 x daily - 7 x weekly - 2 sets - 10 reps - Supine Bridge  - 1 x daily - 7 x weekly - 2 sets - 10 reps - Supine Heel Slide with Strap  - 1 x daily - 7 x weekly - 2 sets - 10 reps - Supine Hip Abduction AROM  - 1 x  daily - 7 x weekly - 3 sets - 10 reps - Seated Long Arc Quad  - 1 x daily - 7 x weekly - 3 sets - 10 reps - Seated March  - 1 x daily - 7 x weekly - 3 sets - 10 reps - 2 hold - Seated Hip Abduction with Resistance  - 1 x daily - 7 x weekly -  2 sets - 10 reps   GOALS: Goals reviewed with patient? Yes  SHORT TERM GOALS: Target date: 04/18/2024  Patient will be independent in home exercise program to improve strength/mobility for better functional independence with ADLs.  Baseline: need to initiate 05/14/2024: Caregiver states engaging patient in this as much as possible Goal status: MET   LONG TERM GOALS: Target date: 05/30/2024  Patient will perform bed mobility with consistently no more than min A utilizing bed features/supports as needed and with no more than than min cuing from aide/caregiver.  Baseline: requires up to mod A and mod/max verbal cuing 05/14/2024: still requiring mod A and mod cuing with caregiver reporting pt having increased leg pain in the morning impacting progress with this Goal status: IN PROGRESS  2.  Patient will increase PsFS score to equal to or greater than and average of 2 points to demonstrate statistically significant improvement in mobility and quality of life.  Baseline: 3.667 05/14/2024: 4.66 Goal status: IN PROGRESS  3.  Patient (> 54 years old) will complete five times sit to stand test in < 30 seconds indicating an increased LE strength and improved balance.  Baseline: 83 seconds from her transport chair with RW in front of her and min progressed to light mod A  05/14/2024: 43.34 seconds from her transport chair with RW in front of her and min progressed to light mod A  Goal status: IN PROGRESS   4.  Patient will reduce timed up and go to <60 sec seconds to reduce fall risk and demonstrate improved transfer/gait ability.  Baseline: 73min54 seconds using RW with min A  05/14/2024: 42min57 seconds using RW with min A  Goal status: IN PROGRESS  5.  Patient  will increase 10 meter walk test by 0.77m/s as to improve gait speed for better household level ambulation and to reduce fall risk.  Baseline: 0.030m/s with RW  05/14/2024: 0.085 m/s using RW with min A Goal status: IN PROGRESS   ASSESSMENT:  CLINICAL IMPRESSION:   Therapy session focused on re-assessment of standardized outcome measures and subjective questionnaire to determine pt's progress with therapy thus far. Patient's Caregiver reports patient has had significant improvement in her ability to turn fully during 90degree stand pivot transfers using RW. Patient demonstrates significant improvement on both 5xSTS and TUG indicating improving B LE functional strength for transfers as well as improving ability to ambulate short distances. However, pt did not show improvement on with patient still having impaired gait endurance over this distance; however, pt does demo improving gait mechanics with increased consistency of reciprocal stepping pattern with improving R LE foot clearance. Pt's Caregiver also reports patient needing to participate in gait training in smaller spaces where the RW will not fit due to patient's small bathroom at home. Ms. Depaula will benefit from further skilled PT to improve these deficits in order to increase QOL and ease/safety with ADLs. Patient's condition has the potential to improve in response to therapy. Maximum improvement is yet to be obtained. The anticipated improvement is attainable and reasonable in a generally predictable time.     OBJECTIVE IMPAIRMENTS: Abnormal gait, decreased activity tolerance, decreased balance, decreased cognition, decreased coordination, decreased endurance, decreased knowledge of condition, decreased knowledge of use of DME, decreased mobility, difficulty walking, decreased ROM, decreased strength, impaired flexibility, impaired sensation, impaired UE functional use, and pain.   ACTIVITY LIMITATIONS: carrying, lifting, sitting,  standing, transfers, bed mobility, continence, bathing, toileting, dressing, self feeding, reach over head, hygiene/grooming, and locomotion  level  PARTICIPATION LIMITATIONS: meal prep, cleaning, laundry, shopping, and community activity  PERSONAL FACTORS: Age, Time since onset of injury/illness/exacerbation, and 3+ comorbidities: hypothyroidism, recurrent UTIs, osteoarthritis, prior anterior trochanter fracture, CKD stage IIIb, chronic systolic heart failure, dementia, hypertension, history of cardiac pacemaker, history of normal pressure hydrocephalus, overreactive bladder are also affecting patient's functional outcome.   REHAB POTENTIAL: Good  CLINICAL DECISION MAKING: Evolving/moderate complexity  EVALUATION COMPLEXITY: Moderate  PLAN:  PT FREQUENCY: 1-2x/week  PT DURATION: 12 weeks  PLANNED INTERVENTIONS: 97164- PT Re-evaluation, 97750- Physical Performance Testing, 97110-Therapeutic exercises, 97530- Therapeutic activity, 97112- Neuromuscular re-education, 97535- Self Care, 02859- Manual therapy, [phone]- Gait training, [phone]- Canalith repositioning, Patient/Family education, Balance training, Stair training, Joint mobilization, Vestibular training, Visual/preceptual remediation/compensation, Cognitive remediation, DME instructions, Cryotherapy, Moist heat, and Biofeedback  PLAN FOR NEXT SESSIONS:   *gait training in small spaces where RW will not fit  - continue gait training and functional B LE strengthening in // bars  - try theraband around hips for increased hip extension?  - continue use of 2lb AWs - continue lateral and forward/backwards stepping over PVC pipe - hip flexor stretch - supine bridges - sit<>stands  - try weighted vest? - standing reaching overhead/upright for improved hip extension and upright posture - gait training - standing marches/hip flexion for improved foot clearance  - continue use of AWs (2lbs on 7/28)    Mayzee Reichenbach, PT, DPT, NCS,  CSRS Physical Therapist - [facility]  [facility]  2:08 PM 05/14/24

## 2024-05-14 NOTE — Therapy (Signed)
 OUTPATIENT OCCUPATIONAL THERAPY NEURO TREATMENT NOTE  Patient Name: Kerri [name] MRN: [id number] DOB:18-Apr-1931, 89 y.o., female Today's Date: 05/14/2024  PCP: Selinda Cyrus Moose, PA-C REFERRING PROVIDER: Maree Hila, MARLA, MD  END OF SESSION:  OT End of Session - 05/14/24 1457     Visit Number 12    Number of Visits 18    Date for OT Re-Evaluation 05/30/24    OT Start Time 1400    OT Stop Time 1445    OT Time Calculation (min) 45 min    Equipment Utilized During Treatment wc    Activity Tolerance Patient tolerated treatment well    Behavior During Therapy WFL for tasks assessed/performed         Past Medical History:  Diagnosis Date   Arthritis    Chronic systolic heart failure (HCC)    CKD (chronic kidney disease), stage III (HCC)    Dementia (HCC)    Hypertension    Hypothyroidism    Overactive bladder    Past Surgical History:  Procedure Laterality Date   INTRAMEDULLARY (IM) NAIL INTERTROCHANTERIC Left 09/06/2022   Procedure: INTRAMEDULLARY (IM) NAIL INTERTROCHANTERIC;  Surgeon: Tobie Priest, MD;  Location: [facility] ORS;  Service: Orthopedics;  Laterality: Left;   PACEMAKER LEADLESS INSERTION N/A 01/07/2021   Procedure: PACEMAKER LEADLESS INSERTION;  Surgeon: Ammon Blunt, MD;  Location: [facility] INVASIVE CV LAB;  Service: Cardiovascular;  Laterality: N/A;   PPM GENERATOR REMOVAL N/A 01/07/2021   Procedure: PPM GENERATOR REMOVAL;  Surgeon: Ammon Blunt, MD;  Location: [facility] INVASIVE CV LAB;  Service: Cardiovascular;  Laterality: N/A;   Patient Active Problem List   Diagnosis Date Noted   AKI (acute kidney injury) (HCC) 01/12/2024   Acute postoperative anemia due to expected blood loss 09/07/2022   Displaced intertrochanteric fracture of left femur, initial encounter for closed fracture (HCC) 09/05/2022   Orthostatic hypotension 07/12/2022   Diarrhea 07/10/2022   COVID-19 virus infection 07/08/2022   Electrolyte abnormality 07/08/2022   Malnutrition of  moderate degree 07/08/2022   Acute delirium 07/08/2022   History of recurrent UTI (urinary tract infection) 01/30/2022   Constipation    UTI (urinary tract infection) 10/15/2021   Elevated brain natriuretic peptide (BNP) level 10/15/2021   Generalized weakness 10/15/2021   Chronic kidney disease, stage 3b (HCC) 10/15/2021   Normal pressure hydrocephalus (HCC) 10/15/2021   Dementia without behavioral disturbance (HCC) 10/15/2021   Hypothyroidism 10/15/2021   Essential hypertension 10/15/2021   Overactive bladder 10/15/2021   Moderate mitral regurgitation 01/13/2021   Status post placement of cardiac pacemaker 01/13/2021   CHB (complete heart block) (HCC) 01/07/2021   Chronic systolic CHF (congestive heart failure) (HCC) 12/17/2020   Postmenopausal osteoporosis 08/26/2016   Mixed Alzheimer's and vascular dementia (HCC) 06/13/2014   ONSET DATE: 09/05/2022  REFERRING DIAG: Intertrochanteric Left Femur Fracture, Lumbar Disc Disease with stenosis  THERAPY DIAG:  Muscle weakness (generalized)  Other lack of coordination  Debility  Rationale for Evaluation and Treatment: Rehabilitation  SUBJECTIVE:  SUBJECTIVE STATEMENT: Pt reports doing well today. Pt accompanied by: [name]-Personal Caregiver  PERTINENT HISTORY:  03/07/24:  Pt returns today to resume therapy services after recent hospitalization for AKI.   Per chart: 89 with past medical history significant for hypothyroidism, recurrent UTIs, osteoarthritis, prior anterior trochanter fracture, CKD stage IIIb, chronic systolic heart failure, dementia, hypertension, history of cardiac pacemaker, history of normal pressure hydrocephalus, overreactive bladder. Patient was previously on suppressive Bactrim . She has been receiving workup outpatient with neurology. Neurology requested for urinalysis but patient was unable  to provide a voided specimen. She went to urology for catheter catheterization UA. At urology office reported feeling weak.  Outpatient labs revealed creatinine 2.9. She was sent to the ED for further evaluation of AKI.   Pt. Is a 89 y.o. female who sustained an Intertrochanteric Left Femur Fracture, Lumbar Disc Disease with Stenosis, Chronic compression Fractures. 09/05/2022, PMHx includes: Mixed Dementia (Alzheimer's Disease, and Vascular Dementia), Pacemaker, Mild spinal canal stenosis L2-3 & L3-4, Hx of UTI.  PRECAUTIONS: Pacemaker (6-8 years)  WEIGHT BEARING RESTRICTIONS: No  PAIN: 04/23/24: Caregiver estimates really bad pain in the morning since reducing therapy to only 1x per week. Are you having pain? Yes, in the a.m. R leg, back   FALLS: Has patient fallen in last 6 months? No, 3 assisted sitdowns to the flloor- When Pt. feels like she is not able to walk any further.  LIVING ENVIRONMENT: Lives with: [name] Lives in: House Stairs:  One story with a basement; 2 steps to enter from the garage. In step down to the den; and and one step ramp into the kitchen Has following equipment at home: Wheelchair (manual), walker, hospital bed with rails, and a lift chair, BCommode, and shower chair. Gait belt, Life Alert, hand held shower head.  PLOF: Independent  PATIENT GOALS:  To be able to stand, and walk to the bathroom. Left hand -Pt. Is not using her left hand much  OBJECTIVE:  Note: Objective measures were completed at Evaluation unless otherwise noted.  HAND DOMINANCE: Right  ADLs: Now, occasional min A, CGA , eating no change, set up for grooming, set up for UB dressing,same for dressing, CGA toilet transfer, bathing same, mod A to lift legs over tub while seated on chair, before hip fx, occasional help with transfers at night or if tired.  Transfers/ambulation related to ADLs:  Eating: Independent, assist with set-up for cutting food Grooming: Independent set-up, seated  UB Dressing: Independent with set-up LB Dressing: Mod/MaxA pants, and socks Toileting: Assist with toilet transfers, clothing  negotiation, Independent toilet hygiene. Some incontinence episodes Bathing: MaxA Tub Shower transfers: Max A shower transfers  IADLs: Shopping: Dependent. Will accompany at times. Light housekeeping: Occasionally wipes the table, and puts items in the trash, folds laundry when brought sitting Meal Prep: Will help assist with baking/mixing items, no cooking, meals provided for the Pt. Community mobility: Relies on family and friends Medication management: Medication management provided for the Pt. Financial management:  No change from baseline  MOBILITY STATUS: Hx of Falls, Needs assist   FUNCTIONAL OUTCOME MEASURES: TBD  UPPER EXTREMITY ROM:    Active ROM Right eval Right 03/07/24 Left eval Left 03/07/24  Shoulder flexion 119(140) 130 (143) 112(130) 117 (130)  Shoulder abduction 112 160 (170) 98 135 (155)  Shoulder adduction      Shoulder extension      Shoulder internal rotation      Shoulder external rotation      Elbow flexion WNL  WNL   Elbow extension WNL  WNL   Wrist flexion      Wrist extension WNL  WNL   Wrist ulnar deviation      Wrist radial deviation      Wrist pronation      Wrist supination      (Blank rows = not tested)  UPPER EXTREMITY MMT:     MMT Right eval Right 03/07/24 Right 04/23/24 Left eval Left 03/07/24 Left 04/23/24  Shoulder flexion 3-/5 4+/5 within available range 4+ 3-/5 3+ within available  range 4-  Shoulder abduction 3-/5 4+/5 within available range 4+ 3-/5 3+ within available range 4-  Shoulder adduction        Shoulder extension        Shoulder internal rotation        Shoulder external rotation        Middle trapezius        Lower trapezius        Elbow flexion 4/5 4+ 4+ 3+/5 4+ 4+  Elbow extension 4/5 4+ 4+ 3+/5 4+ 4+  Wrist flexion     4+ 4+  Wrist extension 4/5 4+ 4+ 3+/5 4+ 4+  Wrist ulnar deviation        Wrist radial deviation        Wrist pronation        Wrist supination        (Blank rows = not tested)  HAND  FUNCTION: Grip strength: Right: 29 lbs; Left: 21 lbs; Pinch strength: Lateral: R: 10#, L: 6#, 3pt. Pinch: R: 8#, L: 5# 03/07/24: Grip strength: Right: 45 lbs; Left: 27 lbs; Pinch strength: Lateral: R: 10 lbs, L: 7 lbs, 3 point pinch: Right: 6 lbs, L: 5# 04/24/23: Grip strength: 43 lbs, Left: 25 lbs; Pinch strength: Lateral: R: 11 lbs, L: 11 lbs; 3 point pinch: Right: 9 lbs, L: 6 lbs  COORDINATION:  9 Hole Peg test: Right: 1 min. & 2  sec; Left: 2 min. & 1 sec 03/07/24: 9 hole peg test: Right: 46 sec, Left: 1 min 24 sec  04/23/24: 9 hole peg test: Right: 46 sec, Left: 58 sec   SENSATION: Light touch: Inconsistent  COGNITION: Overall cognitive status: History of cognitive impairments - at baseline Alzheimer's Dementia, and Vascular Dementia  VISION: Subjective report:  Glasses all the time   VISION ASSESSMENT: Wears glasses all the time at baseline                                                                                                            TREATMENT DATE: 05/14/24 Therapeutic Exercises: Facilitated L hand pinch strengthening: Focus on lateral and 3 point pinch strengthening moving therapy resistant clothespins on/off a vertical dowel (yellow, red, and green pins only).  Mod-max tactile cues to formulate correct prehension patterns.   Therapeutic Activity: -Facilitated L hand FMC/dexterity skills working to place grooved pegs into pegboard, turning pegs within fingertips, storing up to 3 in hand and discarding 1 by 1, picking pegs up from table top.  R hand held by OT or caregiver to prevent compensation.  -Worked on rotating peg within fingertips 180* with OT stabilizing wrist and forearm to isolate finger movements .  PATIENT EDUCATION: Education details: L hand FMC/dexterity skills Person educated: Patient and Caregiver [name] Education method: Explanation, vc Education comprehension: verbalized understanding  HOME EXERCISE PROGRAM: -Yellow theraputty with visual  handout+ -FMC handout  GOALS: Goals reviewed with patient? Yes  SHORT TERM GOALS: Target date: 04/18/2024    Pt. Will require Supervision for BUE HEPs Baseline: Eval: No current HEP; 03/07/24: Will re-establish  HEP d/t therapy interrupted with recent hospitalization; 04/23/24: Pt no longer working with theraputty d/t putty making a mess with a blanket and clothing.  Pt has a ball she squeezes for grip strengthening.  Pt also has [facility] handout and caregiver reports she works on Chief of Staff from written handout Goal status: achieved   LONG TERM GOALS: Target date: 05/30/2024  Pt. Will increase L shoulder strength by 1 mm grade to assist with ADLs/IADLs (revised on 03/07/24). Baseline: Eval: Right: shoulder flexion 3-/5, abduction 3-/5, elbow flexion 4/5, extension 4/5, wrist extension 4/5; Left: shoulder flexion: 3-/5, abduction: 3-/5, elbow flexion 3+/5, extension 3+/5, wrist extension 3+/5; 03/07/24: L shoulder flex/abd 3+/5 within available range; 04/23/24: L shoulder grossly 4-, elbow/wrist 4+ Goal status: ongoing  2.  Pt will increase L grip strength by 5 or more lbs to assist with hiking pants (revised on 03/07/24). Baseline: R: 29#, L: 21#; 03/07/24: L grip 27#; 04/23/24: L grip 25# Goal status: INITIAL  3.  Pt. Will initiate engaging the left hand during daily activity 75% of the time with minimal cuing Baseline: Eval: Limited initiation and engagement of the LUE during daily tasks.  10-15% engaging; 03/07/24: same as eval; 04/23/24: Caregiver estimates that pt uses her hand a little more, maybe 20% of the time.  Caregiver reports that she sees pt holding a napkin when she eats.  Goal status: ongoing  4.  Pt. Will demonstrate compensatory adaptive techniques to assist with ADLs/IADLs. Baseline: Eval: Education to be provided; 03/07/24: ongoing; 04/23/24: ongoing Goal status: ongoing  ASSESSMENT:  CLINICAL IMPRESSION: Patient seen this date with focus on L pinch strengthening  and L hand FMC/dexterity skills.  Pt was able to manage light and moderate resistance clothespins, but not blue and black most resistant pins.  R dominant hand held either by OT or caregiver intermittently throughout session to prevent compensation.  Pt continues to struggles to reposition small items within fingertips, and tends to drop items during translatory movements and attempts at item storage.  Pt. continues to benefit from OT services to work on improving LUE functioning in order to improve, and maximize participation with ADLs, and IADL tasks.  PERFORMANCE DEFICITS: in functional skills including ADLs, IADLs, coordination, dexterity, proprioception, ROM, strength, Fine motor control, Gross motor control, mobility, decreased knowledge of use of DME, and UE functional use, cognitive skills including attention, problem solving, and safety awareness, and psychosocial skills including environmental adaptation and routines and behaviors.   IMPAIRMENTS: are limiting patient from ADLs, IADLs, education, and leisure.   CO-MORBIDITIES: has  other co-morbidities that affects occupational performance. Patient will benefit from skilled OT to address above impairments and improve overall function.  MODIFICATION OR ASSISTANCE TO COMPLETE EVALUATION: Min-Moderate modification of tasks or assist with assess necessary to complete an evaluation.  OT OCCUPATIONAL PROFILE AND HISTORY: Detailed assessment: Review of records and additional review of physical, cognitive, psychosocial history related to current functional performance.  CLINICAL DECISION MAKING: Moderate - several treatment options, min-mod task modification necessary  REHAB POTENTIAL: Good  EVALUATION COMPLEXITY: Moderate  PLAN:  OT FREQUENCY: 1x/week  OT DURATION: 12 weeks  PLANNED INTERVENTIONS: 97168 OT Re-evaluation, 97535 self care/ADL training, 02889 therapeutic exercise, 97530 therapeutic activity, 97112 neuromuscular re-education,  97140 manual therapy, 97018 paraffin, 02989 moist heat, 97034 contrast bath, passive range of motion, functional mobility training, patient/family education, and DME and/or AE instructions  RECOMMENDED OTHER SERVICES: Pt currently working with PT  CONSULTED AND AGREED WITH PLAN OF CARE: Patient and family Adult nurse  PLAN FOR NEXT SESSION: see above   Inocente Blazing, MS, OTR/L    05/14/2024, 2:58 PM

## 2024-05-16 ENCOUNTER — Ambulatory Visit: Admitting: Physical Therapy

## 2024-05-16 DIAGNOSIS — R262 Difficulty in walking, not elsewhere classified: Secondary | ICD-10-CM | POA: Diagnosis not present

## 2024-05-16 DIAGNOSIS — R2681 Unsteadiness on feet: Secondary | ICD-10-CM

## 2024-05-16 DIAGNOSIS — R278 Other lack of coordination: Secondary | ICD-10-CM

## 2024-05-16 DIAGNOSIS — M6281 Muscle weakness (generalized): Secondary | ICD-10-CM

## 2024-05-16 DIAGNOSIS — R269 Unspecified abnormalities of gait and mobility: Secondary | ICD-10-CM

## 2024-05-16 DIAGNOSIS — R5381 Other malaise: Secondary | ICD-10-CM

## 2024-05-16 NOTE — Therapy (Signed)
 OUTPATIENT PHYSICAL THERAPY NEURO TREATMENT   Patient Name: Kerri [name] MRN: [id number] DOB:[birth year], 89 y.o., female Today's Date: 05/16/2024   PCP: Cyrus Selinda Moose, PA-C  REFERRING PROVIDER: Cyrus Selinda Moose, PA-C   END OF SESSION:   PT End of Session - 05/16/24 1151     Visit Number 11    Number of Visits 24    Date for PT Re-Evaluation 05/30/24    Authorization Type [employer] [id number] for 24 PT vsts from 7/21-10/13 (8/6 is 4 of 24)    PT Start Time 1150    PT Stop Time 1234    PT Time Calculation (min) 44 min    Equipment Utilized During Treatment Gait belt    Activity Tolerance Patient tolerated treatment well;No increased pain    Behavior During Therapy WFL for tasks assessed/performed            Past Medical History:  Diagnosis Date   Arthritis    Chronic systolic heart failure (HCC)    CKD (chronic kidney disease), stage III (HCC)    Dementia (HCC)    Hypertension    Hypothyroidism    Overactive bladder    Past Surgical History:  Procedure Laterality Date   INTRAMEDULLARY (IM) NAIL INTERTROCHANTERIC Left 09/06/2022   Procedure: INTRAMEDULLARY (IM) NAIL INTERTROCHANTERIC;  Surgeon: Tobie Priest, MD;  Location: [facility] ORS;  Service: Orthopedics;  Laterality: Left;   PACEMAKER LEADLESS INSERTION N/A 01/07/2021   Procedure: PACEMAKER LEADLESS INSERTION;  Surgeon: Ammon Blunt, MD;  Location: [facility] INVASIVE [facility];  Service: Cardiovascular;  Laterality: N/A;   PPM GENERATOR REMOVAL N/A 01/07/2021   Procedure: PPM GENERATOR REMOVAL;  Surgeon: Ammon Blunt, MD;  Location: [facility] INVASIVE CV [facility];  Service: Cardiovascular;  Laterality: N/A;   Patient Active Problem List   Diagnosis Date Noted   AKI (acute kidney injury) (HCC) 01/12/2024   Acute postoperative anemia due to expected blood loss 09/07/2022   Displaced intertrochanteric fracture of left femur, initial encounter for closed fracture (HCC) 09/05/2022   Orthostatic hypotension  07/12/2022   Diarrhea 07/10/2022   COVID-19 virus infection 07/08/2022   Electrolyte abnormality 07/08/2022   Malnutrition of moderate degree 07/08/2022   Acute delirium 07/08/2022   History of recurrent UTI (urinary tract infection) 01/30/2022   Constipation    UTI (urinary tract infection) 10/15/2021   Elevated brain natriuretic peptide (BNP) level 10/15/2021   Generalized weakness 10/15/2021   Chronic kidney disease, stage 3b (HCC) 10/15/2021   Normal pressure hydrocephalus (HCC) 10/15/2021   Dementia without behavioral disturbance (HCC) 10/15/2021   Hypothyroidism 10/15/2021   Essential hypertension 10/15/2021   Overactive bladder 10/15/2021   Moderate mitral regurgitation 01/13/2021   Status post placement of cardiac pacemaker 01/13/2021   CHB (complete heart block) (HCC) 01/07/2021   Chronic systolic CHF (congestive heart failure) (HCC) 12/17/2020   Postmenopausal osteoporosis 08/26/2016   Mixed Alzheimer's and vascular dementia (HCC) 06/13/2014    ONSET DATE: Progressive decline over ~1-1.5 years  REFERRING DIAG:  M51.369 (ICD-10-CM) - Other intervertebral disc degeneration, lumbar region without mention of lumbar back pain or lower extremity pain  R53.81 (ICD-10-CM) - Other malaise  M62.81 (ICD-10-CM) - Muscle weakness (generalized)    THERAPY DIAG:  Muscle weakness (generalized)  Other lack of coordination  Debility  Difficulty in walking, not elsewhere classified  Abnormality of gait and mobility  Unsteadiness on feet  Rationale for Evaluation and Treatment: Rehabilitation  SUBJECTIVE:  SUBJECTIVE STATEMENT:  [name] reports patient is doing good. Pt reports she is doing pretty good. Caregiver reports no other updates.   From Prior sessions: Caregiver reports pt does have  hx of having lumbar cortisone injections, which would improve patient's walking temporarily, but pt hasn't had one in >40months. Hx of L LE fx, but now reporting pain in R LE   From Eval: Patient previously known to this clinic. Patient with recent short hospitalization following AKI superimposed on CKI stage III with UTI. Pt received HHPT prior to returning to OPPT.  [name], caregiver, reports having taken patient to MD due to pt having decreased interest in doing things and then MD found out pt's kidney levels were low, resulting in her hospitalization.   Denies pain. Denies falls/stumbles at home.  Pt accompanied by: caregiver, [name]   PERTINENT HISTORY:  PMH: hypothyroidism, recurrent UTIs, osteoarthritis, prior anterior trochanter fracture, CKD stage IIIb, chronic systolic heart failure, dementia, hypertension, history of cardiac pacemaker, history of normal pressure hydrocephalus, overreactive bladder   Most recent A&P from Neurology note on 12/21/2023: Mixed dementia (Alzheimer's disease + vascular dementia) with significant ventriculomegaly (with negative lumbar drain trial in 2015) in a patient with history of sepsis secondary to UTI treated with trimethoprim  January 2023. Medication appears to help with UTI and improved mental status - reports good control with current medication Progressive behavioral disturbances with mood swings, outbursts, and apathy, exacerbated during the day. Seroquel (quetiapine) was initiated but caused significant lethargy. Alternative medications, lamotrigine and Depakote, were discussed for her mood-stabilizing properties and different tolerability profiles. - Discuss with primary care provider about reducing Seroquel dose to assess tolerability. Consider 1/2 tablet (12.5 mg) instead.  - If unable to tolerate Seroquel at lower dose, will consider trial of Depakote Valproic Acid is an antiepileptic drug, used for many different conditions. Patient was advised not to  stop medication abruptly, not to use alcohol. Acute side-effects can be abdominal pain, N/V, diarrhea and increased ammonia level. Long term side-effects are hair loss, weight gain, tremors and amenorrhea. Rarely, it can cause low platelets, liver failure and birth defects.  - Consider alternative medications such as lamotrigine or Depakote if Seroquel remains intolerable. - Document alternative medications in the chart for future reference.  Fatigue and somnolence Increased fatigue and somnolence potentially related to Seroquel, hypothyroidism, and medication side effects.  - Monitor heart rate and adjust medications if fatigue persists and heart rate remains low.  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 Symptoms include leg pain, particularly in the right leg, and dragging of the leg during ambulation. Physical therapy has been beneficial. Her symptoms arte chronic. She has had a reportedly negative lower extremity doppler, unavailable for my review.  Right leg pain with inability to pick up feet (right>left - likely from ventriculomegaly + peripheral neuropathy) patient with history of lumbar disc disease with stenosis - reports improvement during recent trial of prednisone. Suspicious for lumbar Disc Disease in addition to decondition Reviewed PCP note: Ongoing leg pain with intermittent weakness. On exam she does have some swelling around the right knee. Unsure if she could have some severe arthritis catching as she walks. Will obtain x-ray of the right knee today. Trial of prednisone 20 mg once a day for 5 days. We did discuss that she has some degenerative lumbar disc disease with stenosis and likely playing a role in right leg weakness. She has a pacemaker and we will defer  MRI of the lumbar spine. Keep follow-up with neurology and if they feel it is necessary they could possibly pursue  nerve conduction study. Inform caretaker to help patient and stay by her side when walking.   - Patient says that her legs feel heavy and that she has difficulty initiating the first step when walking - consistent with magnetic gait in patient with hydrocephalus and brain atrophy - Previous (-) Lumbar Puncture trial at [facility] (01/09/14). - Continue physical therapy to manage symptoms and improve mobility. - strict Emergency Room/return precautions    PAIN:  Are you having pain? No  PRECAUTIONS: Fall and ICD/Pacemaker  RED FLAGS: None and hx of spinal stenosis   WEIGHT BEARING RESTRICTIONS: No  FALLS: Has patient fallen in last 6 months? No  LIVING ENVIRONMENT: Lives with: caregiver 7 days a week. Alone for an hour or 2 a day, but only when alseep.   Lives in: House/apartment Stairs: Yes: Internal: 1 but ramp in place steps; none and External: 1 steps; none Has following equipment at home: Walker - 2 wheeled; transport chair; shower chair and grab bars   PLOF: Needs assistance with ADLs, Needs assistance with gait, Needs assistance with transfers, and Needs assistance with ambulation using RW - Primarily uses transport chair for mobility, but will ambulate with assistance to/from bathroom using RW in the home  PATIENT GOALS: Help her walking be easier and help her get stronger   OBJECTIVE:  Note: Objective measures were completed at Evaluation unless otherwise noted.  DIAGNOSTIC FINDINGS:   EXAM (from 10/28/2022): CT LUMBAR SPINE WITHOUT CONTRAST IMPRESSION: 1. Transitional anatomy. Adhering to convention from the prior lumbar spine MRI, the last fully formed disc space is labeled L5-S1. 2. Unchanged chronic compression fractures of L4 and L5. No new fracture of the lumbar spine. 3. Unchanged lumbar spondylosis notable for moderate narrowing of the right lateral recess at L1-2 and L2-3, as well as mild spinal canal stenosis at L2-3 and L3-4.  Electronically Signed   By:  Ryan Chess M.D.   On: 10/28/2022 16:42   EXAM (from 09/09/2022): CT HEAD WITHOUT CONTRAST  COMPARISON: CT head 09/05/2022, 08/15/2021   IMPRESSION: 1. No acute abnormality and no change from prior studies. 2. Atrophy and chronic microvascular ischemic change in the white matter.   Electronically Signed   By: Carlin Gaskins M.D.   On: 09/09/2022 11:50    COGNITION: Overall cognitive status: History of cognitive impairments - at baseline   SENSATION: Light touch: WFL on screen, but pt with hx of neuropathy in B LEs  COORDINATION: Not formally assessed, likely impacted by strength deficits as noted below  EDEMA:  Not formally assessed, but no significant edema noted  MUSCLE TONE: Not formally assessed   MUSCLE LENGTH: Not formally assessed   DTRs:  Not formally assessed   POSTURE: rounded shoulders, forward head, increased thoracic kyphosis, posterior pelvic tilt, and flexed trunk    LOWER EXTREMITY ROM:     Active  Right Eval Left Eval  Hip flexion Limited AROM by weakness Limited AROM by weakness (more impaired than R)  Hip extension Decreased ROM Decreased ROM  Hip abduction    Hip adduction    Hip internal rotation    Hip external rotation    Knee flexion [facility]  Knee extension [facility]  Ankle dorsiflexion [facility]  Ankle plantarflexion [facility] WFL  Ankle inversion    Ankle eversion     (Blank rows = not tested)  LOWER EXTREMITY  MMT:    MMT Right Eval Left Eval  Hip flexion 3+ 3-  Hip extension    Hip abduction    Hip adduction    Hip internal rotation    Hip external rotation    Knee flexion 4- 4-  Knee extension 4- 4-  Ankle dorsiflexion 4- 3-  Ankle plantarflexion 3+ 4-  Ankle inversion    Ankle eversion    (Blank rows = not tested)  Manual Muscle Test Scale 0/5 = No muscle contraction can be seen or felt 1/5 = Contraction can be felt, but there is no motion 2-/5 = Part moves through incomplete ROM w/ gravity decreased 2/5 = Part  moves through complete ROM w/ gravity decreased 2+/5 = Part moves through incomplete ROM (<50%) against gravity or through complete ROM w/ gravity 3-/5 = Part moves through incomplete ROM (>50%) against gravity 3/5 = Part moves through complete ROM against gravity 3+/5 = Part moves through complete ROM against gravity/slight resistance 4-/5= Holds test position against slight to moderate pressure 4/5 = Part moves through complete ROM against gravity/moderate resistance 4+/5= Holds test position against moderate to strong pressure 5/5 = Part moves through complete ROM against gravity/full resistance  BED MOBILITY:  Findings: Sit to supine Min A for B LE management onto mat Supine to sit Min A for trunk upright with continued impaired motor plan Pt doesn't recall previously trained logroll technique to increase her independence, could be due to impaired carryover of technique in the clinic space Caregiver reports bed mobility has improved slightly at home  Pt with significant difficulty performing anterior scooting towards EOM requiring mod A for this   TRANSFERS: Sit to stand: Min A  Assistive device utilized: Environmental consultant - 2 wheeled     Stand to sit: Min A  Assistive device utilized: Environmental consultant - 2 wheeled     Chair to chair: Min A  Assistive device utilized: Environmental consultant - 2 wheeled      *continues to require cuing to turn fully prior to initiating sitting   RAMP:  Not tested  CURB:  Not tested  STAIRS: Not tested GAIT: Findings: Gait Characteristics: step to pattern, step through pattern, decreased step length- Right, decreased step length- Left, decreased stride length, decreased hip/knee flexion- Right, decreased hip/knee flexion- Left, decreased ankle dorsiflexion- Right, decreased ankle dorsiflexion- Left, trunk flexed, narrow BOS, poor foot clearance- Right, and poor foot clearance- Left, Distance walked: ~49ft x2, Assistive device utilized:Walker - 2 wheeled, Level of assistance: Min A,  and Comments: severely decreased gait speed  FUNCTIONAL TESTS:  5 times sit to stand: 83 seconds Timed up and go (TUG):  40min54 seconds using RW with min A 10 meter walk test: needs to be assessed  PATIENT SURVEYS:  PsFS: average: 3.67  Patient will completely turn 90degrees prior to sitting down during transfers with minimal cuing: 3  Patient will be able to maintain standing balance using B UE support on RW with only SBA while aide provides total A for LB clothing or ADLs: 5 Patient will stand with erect/upright posture when walking: 3  TREATMENT DATE: 05/16/2024   Pt arrives to therapy in her personal transport chair. Caregiver, [name], present.  In // bars with B UE support, performed:  Forward gait focused on reciprocal stepping pattern with B UE support  down/back 3 laps Therapist cuing right, left to sequence LE steps and sustain attention to task while promoting improved step length  Skilled Min A for balance and cuing for increased hip/knee extension during stance to improve upright posture using mirror feedback Pt continues to have impaired sustained attention to task Pt continues to have very narrow BOS (NBOS) with significant trendelenburg hip drop, especially during L stance phase contributing to NBOS  Continues to have decreased R LE foot clearance Standing balance and B LE functional strengthening of B LE alternating foot taps to Blaze Pods on top of brown step 2lb AW on B LEs 2 rounds: 43min30sec &  Continues to have increased difficulty lifting R LE up onto step - requiring max A 3x during 1st round and 2x A during 2nd round to place foot on/off step Continues to have narrow BOS frequently stepping on her own feet (especially when bringing R foot off step) Repeated cuing for upright posture Skilled heavy min/light mod A for balance  throughout Side stepping over PVC pipe Attempted X5 reps wearing 2lb AW on LEs Pt having increasing fatigue at this time resulting in more flexed trunk/hip/knee posture especially towards end with pt relying on B UE support on // bar and max A from therapist to remain upright Pt having more challenge taking a large enough lateral step to leave room for other foot to step over Provided external target to step towards to increase step length with good response, but pt too fatigued to safely continue at this time  *requires at least skilled min A throughout all gait interventions in // bars for balance/safety, despite B UE support, and to provide facilitation for wt shifting onto stance limbs     Gait training 67ft using RW with skilled min A for balance and cuing for improved gait mechanics and AD management:  Pt with increased fatigue today having more prominent forward lean on AD  Continues to have more impaired R LE swing phase advancement lacking foot clearance with weakness in hip flexors compared to L LE (in addition to L hip drop during L stance) Verbal Cuing right, left with therapist providing slight facilitation for weight shifting onto stance limb to encourage reciprocal pattern and sustained attention to task Pt continues to have NBOS today, but more consistent reciprocal stepping pattern when she is able to sustain her attention on the task +2 providing w/c follow to allow increased distance     PATIENT EDUCATION: Education details: Pt educated throughout session about proper posture and technique with exercises. Improved exercise technique, movement at target joints, use of target muscles after min to mod verbal, visual, tactile cues.  Person educated: Patient and Caregiver [name] Education method: Explanation Education comprehension: verbalized understanding and needs further education  HOME EXERCISE PROGRAM:  Access Code: [email or web address] URL:  [email or web address] Date: 12/20/2023 Prepared by: Connell Kiss   Exercises - Sit to Stand with Armchair  - 1 x daily - 7 x weekly - 3 sets - 5 reps - Standing March with Counter Support  - 1 x daily - 7 x weekly - 2 sets - 10 reps - Supine Bridge  - 1 x daily - 7 x weekly - 2 sets - 10 reps - Supine Heel Slide with Strap  -  1 x daily - 7 x weekly - 2 sets - 10 reps - Supine Hip Abduction AROM  - 1 x daily - 7 x weekly - 3 sets - 10 reps - Seated Long Arc Quad  - 1 x daily - 7 x weekly - 3 sets - 10 reps - Seated March  - 1 x daily - 7 x weekly - 3 sets - 10 reps - 2 hold - Seated Hip Abduction with Resistance  - 1 x daily - 7 x weekly - 2 sets - 10 reps   GOALS: Goals reviewed with patient? Yes  SHORT TERM GOALS: Target date: 04/18/2024  Patient will be independent in home exercise program to improve strength/mobility for better functional independence with ADLs.  Baseline: need to initiate 05/14/2024: Caregiver states engaging patient in this as much as possible Goal status: MET   LONG TERM GOALS: Target date: 05/30/2024  Patient will perform bed mobility with consistently no more than min A utilizing bed features/supports as needed and with no more than than min cuing from aide/caregiver.  Baseline: requires up to mod A and mod/max verbal cuing 05/14/2024: still requiring mod A and mod cuing with caregiver reporting pt having increased leg pain in the morning impacting progress with this Goal status: IN PROGRESS  2.  Patient will increase PsFS score to equal to or greater than and average of 2 points to demonstrate statistically significant improvement in mobility and quality of life.  Baseline: 3.667 05/14/2024: 4.66 Goal status: IN PROGRESS  3.  Patient (> 48 years old) will complete five times sit to stand test in < 30 seconds indicating an increased LE strength and improved balance.  Baseline: 83 seconds from her transport chair with RW in front of her and min  progressed to light mod A  05/14/2024: 43.34 seconds from her transport chair with RW in front of her and min progressed to light mod A  Goal status: IN PROGRESS   4.  Patient will reduce timed up and go to <60 sec seconds to reduce fall risk and demonstrate improved transfer/gait ability.  Baseline: 72min54 seconds using RW with min A  05/14/2024: 37min57 seconds using RW with min A  Goal status: IN PROGRESS  5.  Patient will increase 10 meter walk test by 0.1m/s as to improve gait speed for better household level ambulation and to reduce fall risk.  Baseline: 0.056m/s with RW  05/14/2024: 0.085 m/s using RW with min A Goal status: IN PROGRESS   ASSESSMENT:  CLINICAL IMPRESSION:   Therapy session continued to focus on gait training, standing balance and endurance, as well as dynamic balance in the // bars for increased safety and to promote improved upright posture. Patient continues to demonstrate overall improving endurance with standing and gait allowing patient increased independence and decreased caregiver burden. Pt continues to have R LE hip flexor weakness to lift R LE during swing advancement and benefited from alternating foot tap intervention today; although this does fatigue patient. Patient able to advance her gait distance to 89ft using RW today! With continued cuing to sustain attention on gait and improve R LE step length and foot clearance.  Ms. Kopp will benefit from further skilled PT to improve these deficits in order to increase QOL and ease/safety with ADLs. .     OBJECTIVE IMPAIRMENTS: Abnormal gait, decreased activity tolerance, decreased balance, decreased cognition, decreased coordination, decreased endurance, decreased knowledge of condition, decreased knowledge of use of DME, decreased mobility, difficulty walking, decreased  ROM, decreased strength, impaired flexibility, impaired sensation, impaired UE functional use, and pain.   ACTIVITY LIMITATIONS: carrying,  lifting, sitting, standing, transfers, bed mobility, continence, bathing, toileting, dressing, self feeding, reach over head, hygiene/grooming, and locomotion level  PARTICIPATION LIMITATIONS: meal prep, cleaning, laundry, shopping, and community activity  PERSONAL FACTORS: Age, Time since onset of injury/illness/exacerbation, and 3+ comorbidities: hypothyroidism, recurrent UTIs, osteoarthritis, prior anterior trochanter fracture, CKD stage IIIb, chronic systolic heart failure, dementia, hypertension, history of cardiac pacemaker, history of normal pressure hydrocephalus, overreactive bladder are also affecting patient's functional outcome.   REHAB POTENTIAL: Good  CLINICAL DECISION MAKING: Evolving/moderate complexity  EVALUATION COMPLEXITY: Moderate  PLAN:  PT FREQUENCY: 1-2x/week  PT DURATION: 12 weeks  PLANNED INTERVENTIONS: 97164- PT Re-evaluation, 97750- Physical Performance Testing, 97110-Therapeutic exercises, 97530- Therapeutic activity, 97112- Neuromuscular re-education, 97535- Self Care, [id number]- Manual therapy, [phone]- Gait training, [phone]- Canalith repositioning, Patient/Family education, Balance training, Stair training, Joint mobilization, Vestibular training, Visual/preceptual remediation/compensation, Cognitive remediation, DME instructions, Cryotherapy, Moist heat, and Biofeedback  PLAN FOR NEXT SESSIONS:     *gait training in small spaces where RW will not fit  - continue gait training and functional B LE strengthening in // bars  - try theraband around hips for increased hip extension?  - continue use of 2lb AWs - continue lateral and forward/backwards stepping over PVC pipe - hip flexor stretch - supine bridges - sit<>stands  - try weighted vest? - standing reaching overhead/upright for improved hip extension and upright posture - gait training - standing marches/hip flexion for improved foot clearance  - continue use of AWs (2lbs on 7/28)    Tanush Drees, PT,  DPT, NCS, CSRS Physical Therapist - [facility]  [facility]  12:50 PM 05/16/24

## 2024-05-21 ENCOUNTER — Ambulatory Visit: Admitting: Physical Therapy

## 2024-05-21 ENCOUNTER — Ambulatory Visit

## 2024-05-21 DIAGNOSIS — R278 Other lack of coordination: Secondary | ICD-10-CM

## 2024-05-21 DIAGNOSIS — R262 Difficulty in walking, not elsewhere classified: Secondary | ICD-10-CM | POA: Diagnosis not present

## 2024-05-21 DIAGNOSIS — R2681 Unsteadiness on feet: Secondary | ICD-10-CM

## 2024-05-21 DIAGNOSIS — R269 Unspecified abnormalities of gait and mobility: Secondary | ICD-10-CM

## 2024-05-21 DIAGNOSIS — M6281 Muscle weakness (generalized): Secondary | ICD-10-CM

## 2024-05-21 DIAGNOSIS — R5381 Other malaise: Secondary | ICD-10-CM

## 2024-05-21 NOTE — Therapy (Signed)
 OUTPATIENT PHYSICAL THERAPY NEURO TREATMENT   Patient Name: Kerri [name] MRN: [id number] DOB:05-22-1931, 89 y.o., female Today's Date: 05/21/2024   PCP: Cyrus Selinda Moose, PA-C  REFERRING PROVIDER: Cyrus Selinda Moose, PA-C   END OF SESSION:   PT End of Session - 05/21/24 1315     Visit Number 12    Number of Visits 24    Date for PT Re-Evaluation 05/30/24    Authorization Type [employer] [id number] for 24 PT vsts from 7/21-10/13 (8/6 is 4 of 24)    PT Start Time 1315    PT Stop Time 1400    PT Time Calculation (min) 45 min    Equipment Utilized During Treatment Gait belt    Activity Tolerance Patient tolerated treatment well;No increased pain    Behavior During Therapy WFL for tasks assessed/performed             Past Medical History:  Diagnosis Date   Arthritis    Chronic systolic heart failure (HCC)    CKD (chronic kidney disease), stage III (HCC)    Dementia (HCC)    Hypertension    Hypothyroidism    Overactive bladder    Past Surgical History:  Procedure Laterality Date   INTRAMEDULLARY (IM) NAIL INTERTROCHANTERIC Left 09/06/2022   Procedure: INTRAMEDULLARY (IM) NAIL INTERTROCHANTERIC;  Surgeon: Tobie Priest, MD;  Location: [facility] ORS;  Service: Orthopedics;  Laterality: Left;   PACEMAKER LEADLESS INSERTION N/A 01/07/2021   Procedure: PACEMAKER LEADLESS INSERTION;  Surgeon: Ammon Blunt, MD;  Location: [facility]IVE [facility];  Service: Cardiovascular;  Laterality: N/A;   PPM GENERATOR REMOVAL N/A 01/07/2021   Procedure: PPM GENERATOR REMOVAL;  Surgeon: Ammon Blunt, MD;  Location: [facility];  Service: Cardiovascular;  Laterality: N/A;   Patient Active Problem List   Diagnosis Date Noted   AKI (acute kidney injury) (HCC) 01/12/2024   Acute postoperative anemia due to expected blood loss 09/07/2022   Displaced intertrochanteric fracture of left femur, initial encounter for closed fracture (HCC) 09/05/2022   Orthostatic  hypotension 07/12/2022   Diarrhea 07/10/2022   COVID-19 virus infection 07/08/2022   Electrolyte abnormality 07/08/2022   Malnutrition of moderate degree 07/08/2022   Acute delirium 07/08/2022   History of recurrent UTI (urinary tract infection) 01/30/2022   Constipation    UTI (urinary tract infection) 10/15/2021   Elevated brain natriuretic peptide (BNP) level 10/15/2021   Generalized weakness 10/15/2021   Chronic kidney disease, stage 3b (HCC) 10/15/2021   Normal pressure hydrocephalus (HCC) 10/15/2021   Dementia without behavioral disturbance (HCC) 10/15/2021   Hypothyroidism 10/15/2021   Essential hypertension 10/15/2021   Overactive bladder 10/15/2021   Moderate mitral regurgitation 01/13/2021   Status post placement of cardiac pacemaker 01/13/2021   CHB (complete heart block) (HCC) 01/07/2021   Chronic systolic CHF (congestive heart failure) (HCC) 12/17/2020   Postmenopausal osteoporosis 08/26/2016   Mixed Alzheimer's and vascular dementia (HCC) 06/13/2014    ONSET DATE: Progressive decline over ~1-1.5 years  REFERRING DIAG:  M51.369 (ICD-10-CM) - Other intervertebral disc degeneration, lumbar region without mention of lumbar back pain or lower extremity pain  R53.81 (ICD-10-CM) - Other malaise  M62.81 (ICD-10-CM) - Muscle weakness (generalized)    THERAPY DIAG:  Muscle weakness (generalized)  Debility  Difficulty in walking, not elsewhere classified  Other lack of coordination  Abnormality of gait and mobility  Unsteadiness on feet  Rationale for Evaluation and Treatment: Rehabilitation  SUBJECTIVE:  SUBJECTIVE STATEMENT:  Pt reports her niece is here visiting today from [place]. Pt reports she is a little tired from having company. No reports of significant pain. Caregiver,  [name], states pt didn't have much going on this past weekend so she should be well rested for today.    From Prior sessions: Caregiver reports pt does have hx of having lumbar cortisone injections, which would improve patient's walking temporarily, but pt hasn't had one in >51months. Hx of L LE fx, but now reporting pain in R LE   From Eval: Patient previously known to this clinic. Patient with recent short hospitalization following AKI superimposed on CKI stage III with UTI. Pt received HHPT prior to returning to OPPT.  [name], caregiver, reports having taken patient to MD due to pt having decreased interest in doing things and then MD found out pt's kidney levels were low, resulting in her hospitalization.   Denies pain. Denies falls/stumbles at home.  Pt accompanied by: caregiver, [name]   PERTINENT HISTORY:  PMH: hypothyroidism, recurrent UTIs, osteoarthritis, prior anterior trochanter fracture, CKD stage IIIb, chronic systolic heart failure, dementia, hypertension, history of cardiac pacemaker, history of normal pressure hydrocephalus, overreactive bladder   Most recent A&P from Neurology note on 12/21/2023: Mixed dementia (Alzheimer's disease + vascular dementia) with significant ventriculomegaly (with negative lumbar drain trial in 2015) in a patient with history of sepsis secondary to UTI treated with trimethoprim  January 2023. Medication appears to help with UTI and improved mental status - reports good control with current medication Progressive behavioral disturbances with mood swings, outbursts, and apathy, exacerbated during the day. Seroquel (quetiapine) was initiated but caused significant lethargy. Alternative medications, lamotrigine and Depakote, were discussed for her mood-stabilizing properties and different tolerability profiles. - Discuss with primary care provider about reducing Seroquel dose to assess tolerability. Consider 1/2 tablet (12.5 mg) instead.  - If unable to tolerate  Seroquel at lower dose, will consider trial of Depakote Valproic Acid is an antiepileptic drug, used for many different conditions. Patient was advised not to stop medication abruptly, not to use alcohol. Acute side-effects can be abdominal pain, N/V, diarrhea and increased ammonia level. Long term side-effects are hair loss, weight gain, tremors and amenorrhea. Rarely, it can cause low platelets, liver failure and birth defects.  - Consider alternative medications such as lamotrigine or Depakote if Seroquel remains intolerable. - Document alternative medications in the chart for future reference.  Fatigue and somnolence Increased fatigue and somnolence potentially related to Seroquel, hypothyroidism, and medication side effects.  - Monitor heart rate and adjust medications if fatigue persists and heart rate remains low.  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 Symptoms include leg pain, particularly in the right leg, and dragging of the leg during ambulation. Physical therapy has been beneficial. Her symptoms arte chronic. She has had a reportedly negative lower extremity doppler, unavailable for my review.  Right leg pain with inability to pick up feet (right>left - likely from ventriculomegaly + peripheral neuropathy) patient with history of lumbar disc disease with stenosis - reports improvement during recent trial of prednisone. Suspicious for lumbar Disc Disease in addition to decondition Reviewed PCP note: Ongoing leg pain with intermittent weakness. On exam she does have some swelling around the right knee. Unsure if she could have some severe arthritis catching as she walks. Will obtain x-ray of the right knee today. Trial of prednisone 20 mg once a day for 5 days. We did  discuss that she has some degenerative lumbar disc disease with stenosis and likely playing a role in right leg  weakness. She has a pacemaker and we will defer MRI of the lumbar spine. Keep follow-up with neurology and if they feel it is necessary they could possibly pursue nerve conduction study. Inform caretaker to help patient and stay by her side when walking.   - Patient says that her legs feel heavy and that she has difficulty initiating the first step when walking - consistent with magnetic gait in patient with hydrocephalus and brain atrophy - Previous (-) Lumbar Puncture trial at [facility] (01/09/14). - Continue physical therapy to manage symptoms and improve mobility. - strict Emergency Room/return precautions    PAIN:  Are you having pain? No  PRECAUTIONS: Fall and ICD/Pacemaker  RED FLAGS: None and hx of spinal stenosis   WEIGHT BEARING RESTRICTIONS: No  FALLS: Has patient fallen in last 6 months? No  LIVING ENVIRONMENT: Lives with: caregiver 7 days a week. Alone for an hour or 2 a day, but only when alseep.   Lives in: House/apartment Stairs: Yes: Internal: 1 but ramp in place steps; none and External: 1 steps; none Has following equipment at home: Walker - 2 wheeled; transport chair; shower chair and grab bars   PLOF: Needs assistance with ADLs, Needs assistance with gait, Needs assistance with transfers, and Needs assistance with ambulation using RW - Primarily uses transport chair for mobility, but will ambulate with assistance to/from bathroom using RW in the home  PATIENT GOALS: Help her walking be easier and help her get stronger   OBJECTIVE:  Note: Objective measures were completed at Evaluation unless otherwise noted.  DIAGNOSTIC FINDINGS:   EXAM (from 10/28/2022): CT LUMBAR SPINE WITHOUT CONTRAST IMPRESSION: 1. Transitional anatomy. Adhering to convention from the prior lumbar spine MRI, the last fully formed disc space is labeled L5-S1. 2. Unchanged chronic compression fractures of L4 and L5. No new fracture of the lumbar spine. 3. Unchanged lumbar spondylosis  notable for moderate narrowing of the right lateral recess at L1-2 and L2-3, as well as mild spinal canal stenosis at L2-3 and L3-4.  Electronically Signed   By: Ryan Chess M.D.   On: 10/28/2022 16:42   EXAM (from 09/09/2022): CT HEAD WITHOUT CONTRAST  COMPARISON: CT head 09/05/2022, 08/15/2021   IMPRESSION: 1. No acute abnormality and no change from prior studies. 2. Atrophy and chronic microvascular ischemic change in the white matter.   Electronically Signed   By: Carlin Gaskins M.D.   On: 09/09/2022 11:50    COGNITION: Overall cognitive status: History of cognitive impairments - at baseline   SENSATION: Light touch: WFL on screen, but pt with hx of neuropathy in B LEs  COORDINATION: Not formally assessed, likely impacted by strength deficits as noted below  EDEMA:  Not formally assessed, but no significant edema noted  MUSCLE TONE: Not formally assessed   MUSCLE LENGTH: Not formally assessed   DTRs:  Not formally assessed   POSTURE: rounded shoulders, forward head, increased thoracic kyphosis, posterior pelvic tilt, and flexed trunk    LOWER EXTREMITY ROM:     Active  Right Eval Left Eval  Hip flexion Limited AROM by weakness Limited AROM by weakness (more impaired than R)  Hip extension Decreased ROM Decreased ROM  Hip abduction    Hip adduction    Hip internal rotation    Hip external rotation    Knee flexion [facility]  Knee extension [facility]  Ankle  dorsiflexion [facility] WFL  Ankle plantarflexion [facility] WFL  Ankle inversion    Ankle eversion     (Blank rows = not tested)  LOWER EXTREMITY MMT:    MMT Right Eval Left Eval  Hip flexion 3+ 3-  Hip extension    Hip abduction    Hip adduction    Hip internal rotation    Hip external rotation    Knee flexion 4- 4-  Knee extension 4- 4-  Ankle dorsiflexion 4- 3-  Ankle plantarflexion 3+ 4-  Ankle inversion    Ankle eversion    (Blank rows = not tested)  Manual Muscle Test Scale 0/5 = No  muscle contraction can be seen or felt 1/5 = Contraction can be felt, but there is no motion 2-/5 = Part moves through incomplete ROM w/ gravity decreased 2/5 = Part moves through complete ROM w/ gravity decreased 2+/5 = Part moves through incomplete ROM (<50%) against gravity or through complete ROM w/ gravity 3-/5 = Part moves through incomplete ROM (>50%) against gravity 3/5 = Part moves through complete ROM against gravity 3+/5 = Part moves through complete ROM against gravity/slight resistance 4-/5= Holds test position against slight to moderate pressure 4/5 = Part moves through complete ROM against gravity/moderate resistance 4+/5= Holds test position against moderate to strong pressure 5/5 = Part moves through complete ROM against gravity/full resistance  BED MOBILITY:  Findings: Sit to supine Min A for B LE management onto mat Supine to sit Min A for trunk upright with continued impaired motor plan Pt doesn't recall previously trained logroll technique to increase her independence, could be due to impaired carryover of technique in the clinic space Caregiver reports bed mobility has improved slightly at home  Pt with significant difficulty performing anterior scooting towards EOM requiring mod A for this   TRANSFERS: Sit to stand: Min A  Assistive device utilized: Environmental consultant - 2 wheeled     Stand to sit: Min A  Assistive device utilized: Environmental consultant - 2 wheeled     Chair to chair: Min A  Assistive device utilized: Environmental consultant - 2 wheeled      *continues to require cuing to turn fully prior to initiating sitting   RAMP:  Not tested  CURB:  Not tested  STAIRS: Not tested GAIT: Findings: Gait Characteristics: step to pattern, step through pattern, decreased step length- Right, decreased step length- Left, decreased stride length, decreased hip/knee flexion- Right, decreased hip/knee flexion- Left, decreased ankle dorsiflexion- Right, decreased ankle dorsiflexion- Left, trunk flexed,  narrow BOS, poor foot clearance- Right, and poor foot clearance- Left, Distance walked: ~1ft x2, Assistive device utilized:Walker - 2 wheeled, Level of assistance: Min A, and Comments: severely decreased gait speed  FUNCTIONAL TESTS:  5 times sit to stand: 83 seconds Timed up and go (TUG):  59min54 seconds using RW with min A 10 meter walk test: needs to be assessed  PATIENT SURVEYS:  PsFS: average: 3.67  Patient will completely turn 90degrees prior to sitting down during transfers with minimal cuing: 3  Patient will be able to maintain standing balance using B UE support on RW with only SBA while aide provides total A for LB clothing or ADLs: 5 Patient will stand with erect/upright posture when walking: 3  TREATMENT DATE: 05/21/2024   Pt arrives to therapy in her personal transport chair. Caregiver, [name], present.  In // bars with B UE support, performed:  Forward gait focused on reciprocal stepping pattern with B UE support  down/back 3 laps Therapist cueing for increased step length, increased gait speed, & upright posture throughout activity Skilled Min A for balance and cuing for increased hip/knee extension during stance to improve upright posture using mirror feedback Pt continues to have impaired sustained attention to task Pt continues to have very narrow BOS (NBOS) with significant trendelenburg hip drop, especially during L stance phase contributing to NBOS  Continues to have decreased R LE foot clearance Standing balance and B LE functional strengthening of B LE alternating foot taps to Blaze Pods on top of brown step 2lb AW on B LEs 1 round: 17min30sec  Continues to have increased difficulty lifting R LE up onto step - requiring max A 3x during  Continues to have narrow BOS frequently stepping on her own feet (especially when bringing R foot off  step) Repeated cuing for upright posture Skilled heavy min A for balance throughout (but no mod A needed today!) Forward/backwards foot tapping over PVC pipe to 4 targets  2lb AWs on B LEs Skilled min A for balance Pt benefits from external targets to improve step lengths Goal of lifting foot up and over PVC pipe Continues to have greater difficulty lifting R LE compared to L LE *requires at least skilled min A throughout all gait interventions in // bars for balance/safety, despite B UE support, and to provide facilitation for wt shifting onto stance limbs    Repeated sit<>stands from transport chair to RW with 1 Blaze Pod placed up high on a mirror to promote full, upright posture in standing Gave pt 8sec between sit<>stands, but pt requires more time, requiring a total of 4min57sec to hit 10 targets (aka performing 10x STSs) Skilled min A for lifting/lowering during sit<>stand portion Cuing to bring feet back underneath BOS and lean trunk forward when coming to stand Occasional cuing for proper hand placement during the transfer (not pulling up on RW)   Gait training 35ft using RW with skilled min A for balance and cuing for improved gait mechanics and AD management:  Pt with improved upright posture today Continues to have more impaired R LE swing phase advancement lacking foot clearance with weakness in hip flexors compared to L LE (in addition to L hip drop during L stance) Skilled facilitation for weight shifting onto stance limbs to encourage reciprocal pattern and cuing to step towards wheel of RW Pt continues to have NBOS today, but more consistent reciprocal stepping pattern when she is able to sustain her attention on the task +2 providing w/c follow to allow increased distance    PATIENT EDUCATION: Education details: Pt educated throughout session about proper posture and technique with exercises. Improved exercise technique, movement at target joints, use of target muscles  after min to mod verbal, visual, tactile cues.  Person educated: Patient and Caregiver [name] Education method: Explanation Education comprehension: verbalized understanding and needs further education  HOME EXERCISE PROGRAM:  Access Code: [email or web address] URL: [email or web address] Date: 12/20/2023 Prepared by: Connell Kiss   Exercises - Sit to Stand with Armchair  - 1 x daily - 7 x weekly - 3 sets - 5 reps - Standing March with Counter Support  - 1 x daily - 7 x weekly - 2 sets - 10 reps - Supine Bridge  - 1  x daily - 7 x weekly - 2 sets - 10 reps - Supine Heel Slide with Strap  - 1 x daily - 7 x weekly - 2 sets - 10 reps - Supine Hip Abduction AROM  - 1 x daily - 7 x weekly - 3 sets - 10 reps - Seated Long Arc Quad  - 1 x daily - 7 x weekly - 3 sets - 10 reps - Seated March  - 1 x daily - 7 x weekly - 3 sets - 10 reps - 2 hold - Seated Hip Abduction with Resistance  - 1 x daily - 7 x weekly - 2 sets - 10 reps   GOALS: Goals reviewed with patient? Yes  SHORT TERM GOALS: Target date: 04/18/2024  Patient will be independent in home exercise program to improve strength/mobility for better functional independence with ADLs.  Baseline: need to initiate 05/14/2024: Caregiver states engaging patient in this as much as possible Goal status: MET   LONG TERM GOALS: Target date: 05/30/2024  Patient will perform bed mobility with consistently no more than min A utilizing bed features/supports as needed and with no more than than min cuing from aide/caregiver.  Baseline: requires up to mod A and mod/max verbal cuing 05/14/2024: still requiring mod A and mod cuing with caregiver reporting pt having increased leg pain in the morning impacting progress with this Goal status: IN PROGRESS  2.  Patient will increase PsFS score to equal to or greater than and average of 2 points to demonstrate statistically significant improvement in mobility and quality of life.  Baseline: 3.667 05/14/2024:  4.66 Goal status: IN PROGRESS  3.  Patient (> 45 years old) will complete five times sit to stand test in < 30 seconds indicating an increased LE strength and improved balance.  Baseline: 83 seconds from her transport chair with RW in front of her and min progressed to light mod A  05/14/2024: 43.34 seconds from her transport chair with RW in front of her and min progressed to light mod A  Goal status: IN PROGRESS   4.  Patient will reduce timed up and go to <60 sec seconds to reduce fall risk and demonstrate improved transfer/gait ability.  Baseline: 76min54 seconds using RW with min A  05/14/2024: 66min57 seconds using RW with min A  Goal status: IN PROGRESS  5.  Patient will increase 10 meter walk test by 0.25m/s as to improve gait speed for better household level ambulation and to reduce fall risk.  Baseline: 0.011m/s with RW  05/14/2024: 0.085 m/s using RW with min A Goal status: IN PROGRESS   ASSESSMENT:  CLINICAL IMPRESSION:   Therapy session continued to focus on gait training, standing balance and endurance, as well as dynamic balance in the // bars for increased safety and to promote improved upright posture. Patient continues to demonstrate overall improving endurance with standing and gait, allowing patient increased independence and decreasing caregiver burden. Pt continues to have R LE hip flexor weakness to lift R LE during swing advancement and benefited from continuation of alternating foot tap intervention today. Patient demos significant improvements during gait training at end of session today with overall improved trunk/hip extension for upright posture, more consistent reciprocal stepping pattern with improved R LE foot clearance and step length (although still impaired compared to L LE). Ms. Vanwagoner will benefit from further skilled PT to improve these deficits in order to increase QOL and ease/safety with ADLs. .     OBJECTIVE IMPAIRMENTS:  Abnormal gait, decreased activity  tolerance, decreased balance, decreased cognition, decreased coordination, decreased endurance, decreased knowledge of condition, decreased knowledge of use of DME, decreased mobility, difficulty walking, decreased ROM, decreased strength, impaired flexibility, impaired sensation, impaired UE functional use, and pain.   ACTIVITY LIMITATIONS: carrying, lifting, sitting, standing, transfers, bed mobility, continence, bathing, toileting, dressing, self feeding, reach over head, hygiene/grooming, and locomotion level  PARTICIPATION LIMITATIONS: meal prep, cleaning, laundry, shopping, and community activity  PERSONAL FACTORS: Age, Time since onset of injury/illness/exacerbation, and 3+ comorbidities: hypothyroidism, recurrent UTIs, osteoarthritis, prior anterior trochanter fracture, CKD stage IIIb, chronic systolic heart failure, dementia, hypertension, history of cardiac pacemaker, history of normal pressure hydrocephalus, overreactive bladder are also affecting patient's functional outcome.   REHAB POTENTIAL: Good  CLINICAL DECISION MAKING: Evolving/moderate complexity  EVALUATION COMPLEXITY: Moderate  PLAN:  PT FREQUENCY: 1-2x/week  PT DURATION: 12 weeks  PLANNED INTERVENTIONS: 97164- PT Re-evaluation, 97750- Physical Performance Testing, 97110-Therapeutic exercises, 97530- Therapeutic activity, 97112- Neuromuscular re-education, 97535- Self Care, [id number]- Manual therapy, [phone]- Gait training, [phone]- Canalith repositioning, Patient/Family education, Balance training, Stair training, Joint mobilization, Vestibular training, Visual/preceptual remediation/compensation, Cognitive remediation, DME instructions, Cryotherapy, Moist heat, and Biofeedback  PLAN FOR NEXT SESSIONS:    *gait training in small spaces where RW will not fit  - continue gait training and functional B LE strengthening in // bars  - continue use of 2lb AWs - continue lateral and forward/backwards stepping over PVC pipe  -  wearing 2lb AWs as appropriate - standing marches/hip flexion for improved foot clearance  - continue use of AWs (2lbs on 7/28) - sit<>stands  - try weighted vest? - standing reaching overhead/upright for improved hip extension and upright posture - gait training     Johnni Wunschel, PT, DPT, NCS, CSRS Physical Therapist - [place]  [facility]  2:03 PM 05/21/24

## 2024-05-21 NOTE — Therapy (Signed)
 OUTPATIENT OCCUPATIONAL THERAPY NEURO TREATMENT NOTE  Patient Name: Kerri [name] MRN: [id number] DOB:03-08-1931, 89 y.o., female Today's Date: 05/21/2024  PCP: Selinda Cyrus Moose, PA-C REFERRING PROVIDER: Maree Jannett POUR, MD  END OF SESSION:  OT End of Session - 05/21/24 1445     Visit Number 13    Number of Visits 18    Date for OT Re-Evaluation 05/30/24    Authorization Time Period Reporting period beginning 04/23/24    OT Start Time 1400    OT Stop Time 1445    OT Time Calculation (min) 45 min    Equipment Utilized During Treatment wc    Activity Tolerance Patient tolerated treatment well    Behavior During Therapy WFL for tasks assessed/performed         Past Medical History:  Diagnosis Date   Arthritis    Chronic systolic heart failure (HCC)    CKD (chronic kidney disease), stage III (HCC)    Dementia (HCC)    Hypertension    Hypothyroidism    Overactive bladder    Past Surgical History:  Procedure Laterality Date   INTRAMEDULLARY (IM) NAIL INTERTROCHANTERIC Left 09/06/2022   Procedure: INTRAMEDULLARY (IM) NAIL INTERTROCHANTERIC;  Surgeon: Tobie Priest, MD;  Location: [facility];  Service: Orthopedics;  Laterality: Left;   PACEMAKER LEADLESS INSERTION N/A 01/07/2021   Procedure: PACEMAKER LEADLESS INSERTION;  Surgeon: Ammon Blunt, MD;  Location: [facility];  Service: Cardiovascular;  Laterality: N/A;   PPM GENERATOR REMOVAL N/A 01/07/2021   Procedure: PPM GENERATOR REMOVAL;  Surgeon: Ammon Blunt, MD;  Location: [facility];  Service: Cardiovascular;  Laterality: N/A;   Patient Active Problem List   Diagnosis Date Noted   AKI (acute kidney injury) (HCC) 01/12/2024   Acute postoperative anemia due to expected blood loss 09/07/2022   Displaced intertrochanteric fracture of left femur, initial encounter for closed fracture (HCC) 09/05/2022   Orthostatic hypotension 07/12/2022   Diarrhea 07/10/2022   COVID-19 virus infection  07/08/2022   Electrolyte abnormality 07/08/2022   Malnutrition of moderate degree 07/08/2022   Acute delirium 07/08/2022   History of recurrent UTI (urinary tract infection) 01/30/2022   Constipation    UTI (urinary tract infection) 10/15/2021   Elevated brain natriuretic peptide (BNP) level 10/15/2021   Generalized weakness 10/15/2021   Chronic kidney disease, stage 3b (HCC) 10/15/2021   Normal pressure hydrocephalus (HCC) 10/15/2021   Dementia without behavioral disturbance (HCC) 10/15/2021   Hypothyroidism 10/15/2021   Essential hypertension 10/15/2021   Overactive bladder 10/15/2021   Moderate mitral regurgitation 01/13/2021   Status post placement of cardiac pacemaker 01/13/2021   CHB (complete heart block) (HCC) 01/07/2021   Chronic systolic CHF (congestive heart failure) (HCC) 12/17/2020   Postmenopausal osteoporosis 08/26/2016   Mixed Alzheimer's and vascular dementia (HCC) 06/13/2014   ONSET DATE: 09/05/2022  REFERRING DIAG: Intertrochanteric Left Femur Fracture, Lumbar Disc Disease with stenosis  THERAPY DIAG:  Muscle weakness (generalized)  Other lack of coordination  Debility  Rationale for Evaluation and Treatment: Rehabilitation  SUBJECTIVE:  SUBJECTIVE STATEMENT: Caregiver reports gradual improvement with pt using the L hand, but she often keeps her L hand balled up so she encourages pt to lay it flat at rest. Pt accompanied by: [name]-Personal Caregiver  PERTINENT HISTORY:  03/07/24:  Pt returns today to resume therapy services after recent hospitalization for AKI.   Per chart: 89 with past medical history significant for hypothyroidism, recurrent UTIs, osteoarthritis, prior anterior trochanter fracture, CKD stage IIIb, chronic systolic heart failure, dementia,  hypertension, history of cardiac pacemaker, history of normal pressure hydrocephalus, overreactive bladder. Patient was previously on suppressive Bactrim . She has been receiving workup outpatient with  neurology. Neurology requested for urinalysis but patient was unable to provide a voided specimen. She went to urology for catheter catheterization UA. At urology office reported feeling weak. Outpatient labs revealed creatinine 2.9. She was sent to the ED for further evaluation of AKI.   Pt. Is a 89 y.o. female who sustained an Intertrochanteric Left Femur Fracture, Lumbar Disc Disease with Stenosis, Chronic compression Fractures. 09/05/2022, PMHx includes: Mixed Dementia (Alzheimer's Disease, and Vascular Dementia), Pacemaker, Mild spinal canal stenosis L2-3 & L3-4, Hx of UTI.  PRECAUTIONS: Pacemaker (6-8 years)  WEIGHT BEARING RESTRICTIONS: No  PAIN: 05/21/24: No pain reported today.  Are you having pain? Yes, in the a.m. R leg, back   FALLS: Has patient fallen in last 6 months? No, 3 assisted sitdowns to the flloor- When Pt. feels like she is not able to walk any further.  LIVING ENVIRONMENT: Lives with: [name] Lives in: House Stairs:  One story with a basement; 2 steps to enter from the garage. In step down to the den; and and one step ramp into the kitchen Has following equipment at home: Wheelchair (manual), walker, hospital bed with rails, and a lift chair, BCommode, and shower chair. Gait belt, Life Alert, hand held shower head.  PLOF: Independent  PATIENT GOALS:  To be able to stand, and walk to the bathroom. Left hand -Pt. Is not using her left hand much  OBJECTIVE:  Note: Objective measures were completed at Evaluation unless otherwise noted.  HAND DOMINANCE: Right  ADLs: Now, occasional min A, CGA , eating no change, set up for grooming, set up for UB dressing,same for dressing, CGA toilet transfer, bathing same, mod A to lift legs over tub while seated on chair, before hip fx, occasional help with transfers at night or if tired.  Transfers/ambulation related to ADLs:  Eating: Independent, assist with set-up for cutting food Grooming: Independent set-up, seated  UB Dressing:  Independent with set-up LB Dressing: Mod/MaxA pants, and socks Toileting: Assist with toilet transfers, clothing negotiation, Independent toilet hygiene. Some incontinence episodes Bathing: MaxA Tub Shower transfers: Max A shower transfers  IADLs: Shopping: Dependent. Will accompany at times. Light housekeeping: Occasionally wipes the table, and puts items in the trash, folds laundry when brought sitting Meal Prep: Will help assist with baking/mixing items, no cooking, meals provided for the Pt. Community mobility: Relies on family and friends Medication management: Medication management provided for the Pt. Financial management:  No change from baseline  MOBILITY STATUS: Hx of Falls, Needs assist   FUNCTIONAL OUTCOME MEASURES: TBD  UPPER EXTREMITY ROM:    Active ROM Right eval Right 03/07/24 Left eval Left 03/07/24  Shoulder flexion 119(140) 130 (143) 112(130) 117 (130)  Shoulder abduction 112 160 (170) 98 135 (155)  Shoulder adduction      Shoulder extension      Shoulder internal rotation      Shoulder external rotation      Elbow flexion WNL  WNL   Elbow extension WNL  WNL   Wrist flexion      Wrist extension WNL  WNL   Wrist ulnar deviation      Wrist radial deviation      Wrist pronation      Wrist supination      (Blank rows = not tested)  UPPER EXTREMITY MMT:     MMT Right eval  Right 03/07/24 Right 04/23/24 Left eval Left 03/07/24 Left 04/23/24  Shoulder flexion 3-/5 4+/5 within available range 4+ 3-/5 3+ within available range 4-  Shoulder abduction 3-/5 4+/5 within available range 4+ 3-/5 3+ within available range 4-  Shoulder adduction        Shoulder extension        Shoulder internal rotation        Shoulder external rotation        Middle trapezius        Lower trapezius        Elbow flexion 4/5 4+ 4+ 3+/5 4+ 4+  Elbow extension 4/5 4+ 4+ 3+/5 4+ 4+  Wrist flexion     4+ 4+  Wrist extension 4/5 4+ 4+ 3+/5 4+ 4+  Wrist ulnar deviation         Wrist radial deviation        Wrist pronation        Wrist supination        (Blank rows = not tested)  HAND FUNCTION: Grip strength: Right: 29 lbs; Left: 21 lbs; Pinch strength: Lateral: R: 10#, L: 6#, 3pt. Pinch: R: 8#, L: 5# 03/07/24: Grip strength: Right: 45 lbs; Left: 27 lbs; Pinch strength: Lateral: R: 10 lbs, L: 7 lbs, 3 point pinch: Right: 6 lbs, L: 5# 04/24/23: Grip strength: 43 lbs, Left: 25 lbs; Pinch strength: Lateral: R: 11 lbs, L: 11 lbs; 3 point pinch: Right: 9 lbs, L: 6 lbs  COORDINATION:  9 Hole Peg test: Right: 1 min. & 2  sec; Left: 2 min. & 1 sec 03/07/24: 9 hole peg test: Right: 46 sec, Left: 1 min 24 sec  04/23/24: 9 hole peg test: Right: 46 sec, Left: 58 sec   SENSATION: Light touch: Inconsistent  COGNITION: Overall cognitive status: History of cognitive impairments - at baseline Alzheimer's Dementia, and Vascular Dementia  VISION: Subjective report:  Glasses all the time   VISION ASSESSMENT: Wears glasses all the time at baseline                                                                                                            TREATMENT DATE: 05/21/24 Therapeutic Activity: -Facilitated L hand and bilat FMC/dexterity skills: -Pt sorted black and red Connect 4 chips from box to place on table top in prep for picking chips up from table top to place into game slots; Dycem placed beneath chips for easier pick up -bilat hand coordination to disconnect strings of snap beads -L hand FMC/dexterity skills working to pick up snap beads and varying size washers from table top, with and without Dycem, store in hand, and discard 1 by 1 from palm of hand; visual demo and min vc and occasional tactile cues for translatory strategies.   PATIENT EDUCATION: Education details: L hand resting positions to avoid prolonged digit flexion  Person educated: Patient and Caregiver [name] Education method: Explanation, vc Education comprehension: verbalized understanding  HOME  EXERCISE PROGRAM: -Yellow theraputty with visual handout+ -FMC handout  GOALS: Goals reviewed with patient? Yes  SHORT TERM GOALS: Target date: 04/18/2024    Pt. Will require Supervision for BUE HEPs Baseline: Eval: No current HEP; 03/07/24: Will re-establish HEP d/t therapy interrupted with recent hospitalization; 04/23/24: Pt no longer working with theraputty d/t putty making a mess with a blanket and clothing.  Pt has a ball she squeezes for grip strengthening.  Pt also has [facility] handout and caregiver reports she works on Chief of Staff from written handout Goal status: achieved   LONG TERM GOALS: Target date: 05/30/2024  Pt. Will increase L shoulder strength by 1 mm grade to assist with ADLs/IADLs (revised on 03/07/24). Baseline: Eval: Right: shoulder flexion 3-/5, abduction 3-/5, elbow flexion 4/5, extension 4/5, wrist extension 4/5; Left: shoulder flexion: 3-/5, abduction: 3-/5, elbow flexion 3+/5, extension 3+/5, wrist extension 3+/5; 03/07/24: L shoulder flex/abd 3+/5 within available range; 04/23/24: L shoulder grossly 4-, elbow/wrist 4+ Goal status: ongoing  2.  Pt will increase L grip strength by 5 or more lbs to assist with hiking pants (revised on 03/07/24). Baseline: R: 29#, L: 21#; 03/07/24: L grip 27#; 04/23/24: L grip 25# Goal status: INITIAL  3.  Pt. Will initiate engaging the left hand during daily activity 75% of the time with minimal cuing Baseline: Eval: Limited initiation and engagement of the LUE during daily tasks.  10-15% engaging; 03/07/24: same as eval; 04/23/24: Caregiver estimates that pt uses her hand a little more, maybe 20% of the time.  Caregiver reports that she sees pt holding a napkin when she eats.  Goal status: ongoing  4.  Pt. Will demonstrate compensatory adaptive techniques to assist with ADLs/IADLs. Baseline: Eval: Education to be provided; 03/07/24: ongoing; 04/23/24: ongoing Goal status: ongoing  ASSESSMENT:  CLINICAL IMPRESSION: Caregiver  [name] requested additional focus on small item pick up using L hand today.  Pt practiced picking up a variety of shapes and small size items, including Connect 4 chips, small pop beads, and washers from table top, with and without Dycem.  Pt tended to attempt to slide washers to edge of table top when Dycem was not used, but was successful without sliding pieces to edge when reminded by therapist and occasionally requiring repeat trials/extra time.  Some dropping noted with translatory movements, with pt requiring vc/tactile cues to sweep items in palm to radial side of hand using thumb in prep for discarding, occasionally pieces dropped from the ulnar side of hand.  Pt. continues to benefit from OT services to work on improving LUE functioning in order to improve, and maximize participation with ADLs, and IADL tasks.  PERFORMANCE DEFICITS: in functional skills including ADLs, IADLs, coordination, dexterity, proprioception, ROM, strength, Fine motor control, Gross motor control, mobility, decreased knowledge of use of DME, and UE functional use, cognitive skills including attention, problem solving, and safety awareness, and psychosocial skills including environmental adaptation and routines and behaviors.   IMPAIRMENTS: are limiting patient from ADLs, IADLs, education, and leisure.   CO-MORBIDITIES: has  other co-morbidities that affects occupational performance. Patient will benefit from skilled OT to address above impairments and improve overall function.  MODIFICATION OR ASSISTANCE TO COMPLETE EVALUATION: Min-Moderate modification of tasks or assist with assess necessary to complete an evaluation.  OT OCCUPATIONAL PROFILE AND HISTORY: Detailed assessment: Review of records and additional review of physical, cognitive, psychosocial history related to current functional performance.  CLINICAL DECISION MAKING: Moderate - several treatment options, min-mod task modification necessary  REHAB POTENTIAL:  Good  EVALUATION COMPLEXITY: Moderate  PLAN:  OT FREQUENCY: 1x/week  OT DURATION: 12  weeks  PLANNED INTERVENTIONS: 97168 OT Re-evaluation, 97535 self care/ADL training, 02889 therapeutic exercise, 97530 therapeutic activity, 97112 neuromuscular re-education, 97140 manual therapy, 97018 paraffin, 02989 moist heat, 97034 contrast bath, passive range of motion, functional mobility training, patient/family education, and DME and/or AE instructions  RECOMMENDED OTHER SERVICES: Pt currently working with PT  CONSULTED AND AGREED WITH PLAN OF CARE: Patient and family member/caregiver  PLAN FOR NEXT SESSION: see above  Inocente Blazing, MS, OTR/L  05/21/2024, 3:52 PM

## 2024-05-23 ENCOUNTER — Ambulatory Visit: Admitting: Physical Therapy

## 2024-05-23 DIAGNOSIS — R278 Other lack of coordination: Secondary | ICD-10-CM

## 2024-05-23 DIAGNOSIS — R262 Difficulty in walking, not elsewhere classified: Secondary | ICD-10-CM

## 2024-05-23 DIAGNOSIS — R5381 Other malaise: Secondary | ICD-10-CM

## 2024-05-23 DIAGNOSIS — M6281 Muscle weakness (generalized): Secondary | ICD-10-CM

## 2024-05-23 DIAGNOSIS — R2681 Unsteadiness on feet: Secondary | ICD-10-CM

## 2024-05-23 DIAGNOSIS — R269 Unspecified abnormalities of gait and mobility: Secondary | ICD-10-CM

## 2024-05-23 NOTE — Therapy (Signed)
 OUTPATIENT PHYSICAL THERAPY NEURO TREATMENT   Patient Name: Kerri [name] MRN: [id number] DOB:1930-11-06, 89 y.o., female Today's Date: 05/23/2024   PCP: Cyrus Selinda Moose, PA-C  REFERRING PROVIDER: Cyrus Selinda Moose, PA-C   END OF SESSION:   PT End of Session - 05/23/24 1318     Visit Number 13    Number of Visits 24    Date for PT Re-Evaluation 05/30/24    Authorization Type [employer] [id number] for 24 PT vsts from 7/21-10/13 (8/13 is 6 of 24)    PT Start Time 1318    PT Stop Time 1402    PT Time Calculation (min) 44 min    Equipment Utilized During Treatment Gait belt    Activity Tolerance Patient tolerated treatment well;No increased pain    Behavior During Therapy WFL for tasks assessed/performed              Past Medical History:  Diagnosis Date   Arthritis    Chronic systolic heart failure (HCC)    CKD (chronic kidney disease), stage III (HCC)    Dementia (HCC)    Hypertension    Hypothyroidism    Overactive bladder    Past Surgical History:  Procedure Laterality Date   INTRAMEDULLARY (IM) NAIL INTERTROCHANTERIC Left 09/06/2022   Procedure: INTRAMEDULLARY (IM) NAIL INTERTROCHANTERIC;  Surgeon: Tobie Priest, MD;  Location: [facility];  Service: Orthopedics;  Laterality: Left;   PACEMAKER LEADLESS INSERTION N/A 01/07/2021   Procedure: PACEMAKER LEADLESS INSERTION;  Surgeon: Ammon Blunt, MD;  Location: [facility];  Service: Cardiovascular;  Laterality: N/A;   PPM GENERATOR REMOVAL N/A 01/07/2021   Procedure: PPM GENERATOR REMOVAL;  Surgeon: Ammon Blunt, MD;  Location: [facility];  Service: Cardiovascular;  Laterality: N/A;   Patient Active Problem List   Diagnosis Date Noted   AKI (acute kidney injury) (HCC) 01/12/2024   Acute postoperative anemia due to expected blood loss 09/07/2022   Displaced intertrochanteric fracture of left femur, initial encounter for closed fracture (HCC) 09/05/2022   Orthostatic  hypotension 07/12/2022   Diarrhea 07/10/2022   COVID-19 virus infection 07/08/2022   Electrolyte abnormality 07/08/2022   Malnutrition of moderate degree 07/08/2022   Acute delirium 07/08/2022   History of recurrent UTI (urinary tract infection) 01/30/2022   Constipation    UTI (urinary tract infection) 10/15/2021   Elevated brain natriuretic peptide (BNP) level 10/15/2021   Generalized weakness 10/15/2021   Chronic kidney disease, stage 3b (HCC) 10/15/2021   Normal pressure hydrocephalus (HCC) 10/15/2021   Dementia without behavioral disturbance (HCC) 10/15/2021   Hypothyroidism 10/15/2021   Essential hypertension 10/15/2021   Overactive bladder 10/15/2021   Moderate mitral regurgitation 01/13/2021   Status post placement of cardiac pacemaker 01/13/2021   CHB (complete heart block) (HCC) 01/07/2021   Chronic systolic CHF (congestive heart failure) (HCC) 12/17/2020   Postmenopausal osteoporosis 08/26/2016   Mixed Alzheimer's and vascular dementia (HCC) 06/13/2014    ONSET DATE: Progressive decline over ~1-1.5 years  REFERRING DIAG:  M51.369 (ICD-10-CM) - Other intervertebral disc degeneration, lumbar region without mention of lumbar back pain or lower extremity pain  R53.81 (ICD-10-CM) - Other malaise  M62.81 (ICD-10-CM) - Muscle weakness (generalized)    THERAPY DIAG:  Muscle weakness (generalized)  Other lack of coordination  Debility  Difficulty in walking, not elsewhere classified  Abnormality of gait and mobility  Unsteadiness on feet  Rationale for Evaluation and Treatment: Rehabilitation  SUBJECTIVE:  SUBJECTIVE STATEMENT:  Pt reports she is doing good. Pt states her visitors didn't leave until this past Sunday. Denies pain. Caregiver, [name], reports no updates. Continues to  state patient does better achieving and maintaining upright posture and has improvement in her gait the day after participating in therapy.    From Prior sessions: Caregiver reports pt does have hx of having lumbar cortisone injections, which would improve patient's walking temporarily, but pt hasn't had one in >61months. Hx of L LE fx, but now reporting pain in R LE   From Eval: Patient previously known to this clinic. Patient with recent short hospitalization following AKI superimposed on CKI stage III with UTI. Pt received HHPT prior to returning to OPPT.  [name], caregiver, reports having taken patient to MD due to pt having decreased interest in doing things and then MD found out pt's kidney levels were low, resulting in her hospitalization.   Denies pain. Denies falls/stumbles at home.  Pt accompanied by: caregiver, [name]   PERTINENT HISTORY:  PMH: hypothyroidism, recurrent UTIs, osteoarthritis, prior anterior trochanter fracture, CKD stage IIIb, chronic systolic heart failure, dementia, hypertension, history of cardiac pacemaker, history of normal pressure hydrocephalus, overreactive bladder   Most recent A&P from Neurology note on 12/21/2023: Mixed dementia (Alzheimer's disease + vascular dementia) with significant ventriculomegaly (with negative lumbar drain trial in 2015) in a patient with history of sepsis secondary to UTI treated with trimethoprim  January 2023. Medication appears to help with UTI and improved mental status - reports good control with current medication Progressive behavioral disturbances with mood swings, outbursts, and apathy, exacerbated during the day. Seroquel (quetiapine) was initiated but caused significant lethargy. Alternative medications, lamotrigine and Depakote, were discussed for her mood-stabilizing properties and different tolerability profiles. - Discuss with primary care provider about reducing Seroquel dose to assess tolerability. Consider 1/2 tablet  (12.5 mg) instead.  - If unable to tolerate Seroquel at lower dose, will consider trial of Depakote Valproic Acid is an antiepileptic drug, used for many different conditions. Patient was advised not to stop medication abruptly, not to use alcohol. Acute side-effects can be abdominal pain, N/V, diarrhea and increased ammonia level. Long term side-effects are hair loss, weight gain, tremors and amenorrhea. Rarely, it can cause low platelets, liver failure and birth defects.  - Consider alternative medications such as lamotrigine or Depakote if Seroquel remains intolerable. - Document alternative medications in the chart for future reference.  Fatigue and somnolence Increased fatigue and somnolence potentially related to Seroquel, hypothyroidism, and medication side effects.  - Monitor heart rate and adjust medications if fatigue persists and heart rate remains low.  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 Symptoms include leg pain, particularly in the right leg, and dragging of the leg during ambulation. Physical therapy has been beneficial. Her symptoms arte chronic. She has had a reportedly negative lower extremity doppler, unavailable for my review.  Right leg pain with inability to pick up feet (right>left - likely from ventriculomegaly + peripheral neuropathy) patient with history of lumbar disc disease with stenosis - reports improvement during recent trial of prednisone. Suspicious for lumbar Disc Disease in addition to decondition Reviewed PCP note: Ongoing leg pain with intermittent weakness. On exam she does have some swelling around the right knee. Unsure if she could have some severe arthritis catching as she walks. Will obtain x-ray of the right knee today. Trial of prednisone 20 mg once a day for 5 days. We  did discuss that she has some degenerative lumbar disc disease with  stenosis and likely playing a role in right leg weakness. She has a pacemaker and we will defer MRI of the lumbar spine. Keep follow-up with neurology and if they feel it is necessary they could possibly pursue nerve conduction study. Inform caretaker to help patient and stay by her side when walking.   - Patient says that her legs feel heavy and that she has difficulty initiating the first step when walking - consistent with magnetic gait in patient with hydrocephalus and brain atrophy - Previous (-) Lumbar Puncture trial at [facility] (01/09/14). - Continue physical therapy to manage symptoms and improve mobility. - strict Emergency Room/return precautions    PAIN:  Are you having pain? No  PRECAUTIONS: Fall and ICD/Pacemaker  RED FLAGS: None and hx of spinal stenosis   WEIGHT BEARING RESTRICTIONS: No  FALLS: Has patient fallen in last 6 months? No  LIVING ENVIRONMENT: Lives with: caregiver 7 days a week. Alone for an hour or 2 a day, but only when alseep.   Lives in: House/apartment Stairs: Yes: Internal: 1 but ramp in place steps; none and External: 1 steps; none Has following equipment at home: Walker - 2 wheeled; transport chair; shower chair and grab bars   PLOF: Needs assistance with ADLs, Needs assistance with gait, Needs assistance with transfers, and Needs assistance with ambulation using RW - Primarily uses transport chair for mobility, but will ambulate with assistance to/from bathroom using RW in the home  PATIENT GOALS: Help her walking be easier and help her get stronger   OBJECTIVE:  Note: Objective measures were completed at Evaluation unless otherwise noted.  DIAGNOSTIC FINDINGS:   EXAM (from 10/28/2022): CT LUMBAR SPINE WITHOUT CONTRAST IMPRESSION: 1. Transitional anatomy. Adhering to convention from the prior lumbar spine MRI, the last fully formed disc space is labeled L5-S1. 2. Unchanged chronic compression fractures of L4 and L5. No new fracture of the  lumbar spine. 3. Unchanged lumbar spondylosis notable for moderate narrowing of the right lateral recess at L1-2 and L2-3, as well as mild spinal canal stenosis at L2-3 and L3-4.  Electronically Signed   By: Ryan Chess M.D.   On: 10/28/2022 16:42   EXAM (from 09/09/2022): CT HEAD WITHOUT CONTRAST  COMPARISON: CT head 09/05/2022, 08/15/2021   IMPRESSION: 1. No acute abnormality and no change from prior studies. 2. Atrophy and chronic microvascular ischemic change in the white matter.   Electronically Signed   By: Carlin Gaskins M.D.   On: 09/09/2022 11:50    COGNITION: Overall cognitive status: History of cognitive impairments - at baseline   SENSATION: Light touch: WFL on screen, but pt with hx of neuropathy in B LEs  COORDINATION: Not formally assessed, likely impacted by strength deficits as noted below  EDEMA:  Not formally assessed, but no significant edema noted  MUSCLE TONE: Not formally assessed   MUSCLE LENGTH: Not formally assessed   DTRs:  Not formally assessed   POSTURE: rounded shoulders, forward head, increased thoracic kyphosis, posterior pelvic tilt, and flexed trunk    LOWER EXTREMITY ROM:     Active  Right Eval Left Eval  Hip flexion Limited AROM by weakness Limited AROM by weakness (more impaired than R)  Hip extension Decreased ROM Decreased ROM  Hip abduction    Hip adduction    Hip internal rotation    Hip external rotation    Knee flexion [facility]  Knee extension [facility]  Ankle dorsiflexion [facility]  Ankle plantarflexion [facility] WFL  Ankle inversion    Ankle eversion     (Blank rows = not tested)  LOWER EXTREMITY MMT:    MMT Right Eval Left Eval  Hip flexion 3+ 3-  Hip extension    Hip abduction    Hip adduction    Hip internal rotation    Hip external rotation    Knee flexion 4- 4-  Knee extension 4- 4-  Ankle dorsiflexion 4- 3-  Ankle plantarflexion 3+ 4-  Ankle inversion    Ankle eversion    (Blank rows = not  tested)  Manual Muscle Test Scale 0/5 = No muscle contraction can be seen or felt 1/5 = Contraction can be felt, but there is no motion 2-/5 = Part moves through incomplete ROM w/ gravity decreased 2/5 = Part moves through complete ROM w/ gravity decreased 2+/5 = Part moves through incomplete ROM (<50%) against gravity or through complete ROM w/ gravity 3-/5 = Part moves through incomplete ROM (>50%) against gravity 3/5 = Part moves through complete ROM against gravity 3+/5 = Part moves through complete ROM against gravity/slight resistance 4-/5= Holds test position against slight to moderate pressure 4/5 = Part moves through complete ROM against gravity/moderate resistance 4+/5= Holds test position against moderate to strong pressure 5/5 = Part moves through complete ROM against gravity/full resistance  BED MOBILITY:  Findings: Sit to supine Min A for B LE management onto mat Supine to sit Min A for trunk upright with continued impaired motor plan Pt doesn't recall previously trained logroll technique to increase her independence, could be due to impaired carryover of technique in the clinic space Caregiver reports bed mobility has improved slightly at home  Pt with significant difficulty performing anterior scooting towards EOM requiring mod A for this   TRANSFERS: Sit to stand: Min A  Assistive device utilized: Environmental consultant - 2 wheeled     Stand to sit: Min A  Assistive device utilized: Environmental consultant - 2 wheeled     Chair to chair: Min A  Assistive device utilized: Environmental consultant - 2 wheeled      *continues to require cuing to turn fully prior to initiating sitting   RAMP:  Not tested  CURB:  Not tested  STAIRS: Not tested GAIT: Findings: Gait Characteristics: step to pattern, step through pattern, decreased step length- Right, decreased step length- Left, decreased stride length, decreased hip/knee flexion- Right, decreased hip/knee flexion- Left, decreased ankle dorsiflexion- Right,  decreased ankle dorsiflexion- Left, trunk flexed, narrow BOS, poor foot clearance- Right, and poor foot clearance- Left, Distance walked: ~64ft x2, Assistive device utilized:Walker - 2 wheeled, Level of assistance: Min A, and Comments: severely decreased gait speed  FUNCTIONAL TESTS:  5 times sit to stand: 83 seconds Timed up and go (TUG):  110min54 seconds using RW with min A 10 meter walk test: needs to be assessed  PATIENT SURVEYS:  PsFS: average: 3.67  Patient will completely turn 90degrees prior to sitting down during transfers with minimal cuing: 3  Patient will be able to maintain standing balance using B UE support on RW with only SBA while aide provides total A for LB clothing or ADLs: 5 Patient will stand with erect/upright posture when walking: 3  TREATMENT DATE: 05/23/2024   Pt arrives to therapy in her personal transport chair. Caregiver, [name], present.   Repeated sit<>stands from transport chair to RW with 1 Blaze Pod placed up high on a mirror to promote full, upright posture in standing Gave pt 8sec between sit<>stands, but pt requires more time, requiring a total of 73min37sec to hit 10 targets (aka performing 10x STSs) More consistent cuing to sustain attention to task today Skilled min A for lifting/lowering during sit<>stand portion Requires lighter min A today with improving anterior trunk lean Cuing to bring feet back underneath BOS and lean trunk forward when coming to stand Occasional cuing for proper hand placement during the transfer (not pulling up on RW)    In // bars with B UE support, performed:  Forward gait focused on reciprocal stepping pattern with B UE support  down/back 3 laps Therapist cueing for increased step length, increased gait speed, & upright posture throughout activity Skilled Min A for balance and cuing for increased  hip/knee extension during stance to improve upright posture using mirror feedback Pt continues to have impaired sustained attention to task Pt continues to have very narrow BOS (NBOS) with significant trendelenburg hip drop, especially during L stance phase contributing to NBOS  Continues to have decreased R LE foot clearance Standing balance and B LE functional strengthening of B LE alternating foot taps to Blaze Pods on top of brown step 2lb AW on B LEs 1 round: 84min30sec - 22 hits Continues to have increased difficulty lifting R LE up onto step - requiring max A 3x during - but pt demos significant improvement in lifting R LE today!! Continues to have narrow BOS frequently stepping on her own feet (especially when bringing R foot off step) Repeated cuing for upright posture Skilled heavy min A for balance throughout (but no mod A needed today!) Side stepping over PVC pipe Performed X8 reps wearing 2lb AW on LEs Pt having increasing fatigue at this time resulting in more flexed trunk/hip/knee posture especially towards end with pt relying on B UE support on // bar and min/light mod A from therapist to remain upright Used external target to step feet towards and pt demonstrating significant improvement in step width; however, continues to have greatest challenge leading with R LE   *requires at least skilled min A throughout all gait interventions in // bars for balance/safety, despite B UE support, and to provide facilitation for wt shifting onto stance limbs    Gait training 51ft using RW with skilled min A for balance and cuing for improved gait mechanics and AD management:  Pt with improved upright posture today Continues to have more impaired R LE swing phase advancement lacking foot clearance with weakness in hip flexors compared to L LE (in addition to L hip drop during L stance) This is improving! Skilled facilitation for weight shifting onto stance limbs to encourage reciprocal  pattern and cuing to step towards front wheels of RW Pt continues to have NBOS  Pt with more consistent reciprocal stepping pattern today when she is able to sustain her attention on the task which was ~50% of the distance today! +2 providing w/c follow to allow increased distance    PATIENT EDUCATION: Education details: Pt educated throughout session about proper posture and technique with exercises. Improved exercise technique, movement at target joints, use of target muscles after min to mod verbal, visual, tactile cues.  Person educated: Patient and Caregiver [name] Education method: Explanation Education comprehension: verbalized understanding  and needs further education  HOME EXERCISE PROGRAM:  Access Code: [email or web address] URL: [email or web address] Date: 12/20/2023 Prepared by: Connell Kiss   Exercises - Sit to Stand with Armchair  - 1 x daily - 7 x weekly - 3 sets - 5 reps - Standing March with Counter Support  - 1 x daily - 7 x weekly - 2 sets - 10 reps - Supine Bridge  - 1 x daily - 7 x weekly - 2 sets - 10 reps - Supine Heel Slide with Strap  - 1 x daily - 7 x weekly - 2 sets - 10 reps - Supine Hip Abduction AROM  - 1 x daily - 7 x weekly - 3 sets - 10 reps - Seated Long Arc Quad  - 1 x daily - 7 x weekly - 3 sets - 10 reps - Seated March  - 1 x daily - 7 x weekly - 3 sets - 10 reps - 2 hold - Seated Hip Abduction with Resistance  - 1 x daily - 7 x weekly - 2 sets - 10 reps   GOALS: Goals reviewed with patient? Yes  SHORT TERM GOALS: Target date: 04/18/2024  Patient will be independent in home exercise program to improve strength/mobility for better functional independence with ADLs.  Baseline: need to initiate 05/14/2024: Caregiver states engaging patient in this as much as possible Goal status: MET   LONG TERM GOALS: Target date: 05/30/2024  Patient will perform bed mobility with consistently no more than min A utilizing bed features/supports as needed and  with no more than than min cuing from aide/caregiver.  Baseline: requires up to mod A and mod/max verbal cuing 05/14/2024: still requiring mod A and mod cuing with caregiver reporting pt having increased leg pain in the morning impacting progress with this Goal status: IN PROGRESS  2.  Patient will increase PsFS score to equal to or greater than and average of 2 points to demonstrate statistically significant improvement in mobility and quality of life.  Baseline: 3.667 05/14/2024: 4.66 Goal status: IN PROGRESS  3.  Patient (> 64 years old) will complete five times sit to stand test in < 30 seconds indicating an increased LE strength and improved balance.  Baseline: 83 seconds from her transport chair with RW in front of her and min progressed to light mod A  05/14/2024: 43.34 seconds from her transport chair with RW in front of her and min progressed to light mod A  Goal status: IN PROGRESS   4.  Patient will reduce timed up and go to <60 sec seconds to reduce fall risk and demonstrate improved transfer/gait ability.  Baseline: 66min54 seconds using RW with min A  05/14/2024: 60min57 seconds using RW with min A  Goal status: IN PROGRESS  5.  Patient will increase 10 meter walk test by 0.26m/s as to improve gait speed for better household level ambulation and to reduce fall risk.  Baseline: 0.084m/s with RW  05/14/2024: 0.085 m/s using RW with min A Goal status: IN PROGRESS   ASSESSMENT:  CLINICAL IMPRESSION:   Therapy session continued to focus on gait training, standing balance and endurance, as well as dynamic balance in the // bars for increased safety and to promote improved upright posture. Patient continues to demonstrate overall improving endurance with standing and gait, allowing patient increased independence and decreasing caregiver burden. Pt continues to have R LE hip flexor weakness to lift R LE during swing advancement and benefited from continuation of alternating foot tap  intervention today, with pt demonstrating improving strength with increased foot clearance on the step! Patient demos significant improvements during gait training at end of session today with overall improved trunk/hip extension for upright posture, more consistent reciprocal stepping pattern with improved R LE foot clearance and step length (although still impaired compared to L LE) at least 50% of the distance! Patient also continues to demonstrate impaired hip abductor strength and benefits from lateral side stepping over small obstacle with pt demonstrating improved foot clearance and step length when given an external target to step towards. Ms. Mariner will benefit from further skilled PT to improve these deficits in order to increase QOL and ease/safety with ADLs. .     OBJECTIVE IMPAIRMENTS: Abnormal gait, decreased activity tolerance, decreased balance, decreased cognition, decreased coordination, decreased endurance, decreased knowledge of condition, decreased knowledge of use of DME, decreased mobility, difficulty walking, decreased ROM, decreased strength, impaired flexibility, impaired sensation, impaired UE functional use, and pain.   ACTIVITY LIMITATIONS: carrying, lifting, sitting, standing, transfers, bed mobility, continence, bathing, toileting, dressing, self feeding, reach over head, hygiene/grooming, and locomotion level  PARTICIPATION LIMITATIONS: meal prep, cleaning, laundry, shopping, and community activity  PERSONAL FACTORS: Age, Time since onset of injury/illness/exacerbation, and 3+ comorbidities: hypothyroidism, recurrent UTIs, osteoarthritis, prior anterior trochanter fracture, CKD stage IIIb, chronic systolic heart failure, dementia, hypertension, history of cardiac pacemaker, history of normal pressure hydrocephalus, overreactive bladder are also affecting patient's functional outcome.   REHAB POTENTIAL: Good  CLINICAL DECISION MAKING: Evolving/moderate  complexity  EVALUATION COMPLEXITY: Moderate  PLAN:  PT FREQUENCY: 1-2x/week  PT DURATION: 12 weeks  PLANNED INTERVENTIONS: 97164- PT Re-evaluation, 97750- Physical Performance Testing, 97110-Therapeutic exercises, 97530- Therapeutic activity, 97112- Neuromuscular re-education, 97535- Self Care, [id number]- Manual therapy, [phone]- Gait training, [phone]- Canalith repositioning, Patient/Family education, Balance training, Stair training, Joint mobilization, Vestibular training, Visual/preceptual remediation/compensation, Cognitive remediation, DME instructions, Cryotherapy, Moist heat, and Biofeedback  PLAN FOR NEXT SESSIONS:     *gait training in small spaces where RW will not fit  - continue gait training and functional B LE strengthening in // bars  - continue use of 2lb AWs - continue lateral and forward/backwards stepping over PVC pipe  - wearing 2lb AWs as appropriate  - progress to lateral foot taps on step? - standing marches/foot taps/hip flexion for improved foot clearance  - continue use of AWs (2lbs on 7/28) - sit<>stands  - try weighted vest? - standing reaching overhead/upright for improved hip extension and upright posture - gait training     Mao Lockner, PT, DPT, NCS, CSRS Physical Therapist - [facility]  5:44 PM 05/23/24

## 2024-05-25 NOTE — Therapy (Signed)
 OUTPATIENT PHYSICAL THERAPY NEURO TREATMENT   Patient Name: Kerri [name] MRN: [id number] DOB:07-Aug-1931, 89 y.o., female Today's Date: 05/28/2024   PCP: Cyrus Selinda Moose, PA-C  REFERRING PROVIDER: Cyrus Selinda Moose, PA-C   END OF SESSION:   PT End of Session - 05/28/24 1318     Visit Number 14    Number of Visits 24    Date for PT Re-Evaluation 05/30/24    Authorization Type [employer] [id number] for 24 PT vsts from 7/21-10/13 (8/13 is 6 of 24)    PT Start Time 1317    PT Stop Time 1358 (P)     PT Time Calculation (min) 41 min (P)     Equipment Utilized During Treatment Gait belt    Activity Tolerance Patient tolerated treatment well;No increased pain    Behavior During Therapy WFL for tasks assessed/performed               Past Medical History:  Diagnosis Date   Arthritis    Chronic systolic heart failure (HCC)    CKD (chronic kidney disease), stage III (HCC)    Dementia (HCC)    Hypertension    Hypothyroidism    Overactive bladder    Past Surgical History:  Procedure Laterality Date   INTRAMEDULLARY (IM) NAIL INTERTROCHANTERIC Left 09/06/2022   Procedure: INTRAMEDULLARY (IM) NAIL INTERTROCHANTERIC;  Surgeon: Tobie Priest, MD;  Location: [facility] ORS;  Service: Orthopedics;  Laterality: Left;   PACEMAKER LEADLESS INSERTION N/A 01/07/2021   Procedure: PACEMAKER LEADLESS INSERTION;  Surgeon: Ammon Blunt, MD;  Location: [facility] INVASIVE CV LAB;  Service: Cardiovascular;  Laterality: N/A;   PPM GENERATOR REMOVAL N/A 01/07/2021   Procedure: PPM GENERATOR REMOVAL;  Surgeon: Ammon Blunt, MD;  Location: [facility] INVASIVE CV LAB;  Service: Cardiovascular;  Laterality: N/A;   Patient Active Problem List   Diagnosis Date Noted   AKI (acute kidney injury) (HCC) 01/12/2024   Acute postoperative anemia due to expected blood loss 09/07/2022   Displaced intertrochanteric fracture of left femur, initial encounter for closed fracture (HCC) 09/05/2022    Orthostatic hypotension 07/12/2022   Diarrhea 07/10/2022   COVID-19 virus infection 07/08/2022   Electrolyte abnormality 07/08/2022   Malnutrition of moderate degree 07/08/2022   Acute delirium 07/08/2022   History of recurrent UTI (urinary tract infection) 01/30/2022   Constipation    UTI (urinary tract infection) 10/15/2021   Elevated brain natriuretic peptide (BNP) level 10/15/2021   Generalized weakness 10/15/2021   Chronic kidney disease, stage 3b (HCC) 10/15/2021   Normal pressure hydrocephalus (HCC) 10/15/2021   Dementia without behavioral disturbance (HCC) 10/15/2021   Hypothyroidism 10/15/2021   Essential hypertension 10/15/2021   Overactive bladder 10/15/2021   Moderate mitral regurgitation 01/13/2021   Status post placement of cardiac pacemaker 01/13/2021   CHB (complete heart block) (HCC) 01/07/2021   Chronic systolic CHF (congestive heart failure) (HCC) 12/17/2020   Postmenopausal osteoporosis 08/26/2016   Mixed Alzheimer's and vascular dementia (HCC) 06/13/2014    ONSET DATE: Progressive decline over ~1-1.5 years  REFERRING DIAG:  M51.369 (ICD-10-CM) - Other intervertebral disc degeneration, lumbar region without mention of lumbar back pain or lower extremity pain  R53.81 (ICD-10-CM) - Other malaise  M62.81 (ICD-10-CM) - Muscle weakness (generalized)    THERAPY DIAG:  Muscle weakness (generalized)  Other lack of coordination  Difficulty in walking, not elsewhere classified  Unsteadiness on feet  Rationale for Evaluation and Treatment: Rehabilitation  SUBJECTIVE:  SUBJECTIVE STATEMENT:   No new updates from caregiver, [name], this date.    From Prior sessions: Caregiver reports pt does have hx of having lumbar cortisone injections, which would improve patient's walking  temporarily, but pt hasn't had one in >23months. Hx of L LE fx, but now reporting pain in R LE   From Eval: Patient previously known to this clinic. Patient with recent short hospitalization following AKI superimposed on CKI stage III with UTI. Pt received HHPT prior to returning to OPPT.  [name], caregiver, reports having taken patient to MD due to pt having decreased interest in doing things and then MD found out pt's kidney levels were low, resulting in her hospitalization.   Denies pain. Denies falls/stumbles at home.  Pt accompanied by: caregiver, [name]   PERTINENT HISTORY:  PMH: hypothyroidism, recurrent UTIs, osteoarthritis, prior anterior trochanter fracture, CKD stage IIIb, chronic systolic heart failure, dementia, hypertension, history of cardiac pacemaker, history of normal pressure hydrocephalus, overreactive bladder   Most recent A&P from Neurology note on 12/21/2023: Mixed dementia (Alzheimer's disease + vascular dementia) with significant ventriculomegaly (with negative lumbar drain trial in 2015) in a patient with history of sepsis secondary to UTI treated with trimethoprim  January 2023. Medication appears to help with UTI and improved mental status - reports good control with current medication Progressive behavioral disturbances with mood swings, outbursts, and apathy, exacerbated during the day. Seroquel (quetiapine) was initiated but caused significant lethargy. Alternative medications, lamotrigine and Depakote, were discussed for her mood-stabilizing properties and different tolerability profiles. - Discuss with primary care provider about reducing Seroquel dose to assess tolerability. Consider 1/2 tablet (12.5 mg) instead.  - If unable to tolerate Seroquel at lower dose, will consider trial of Depakote Valproic Acid is an antiepileptic drug, used for many different conditions. Patient was advised not to stop medication abruptly, not to use alcohol. Acute side-effects can be  abdominal pain, N/V, diarrhea and increased ammonia level. Long term side-effects are hair loss, weight gain, tremors and amenorrhea. Rarely, it can cause low platelets, liver failure and birth defects.  - Consider alternative medications such as lamotrigine or Depakote if Seroquel remains intolerable. - Document alternative medications in the chart for future reference.  Fatigue and somnolence Increased fatigue and somnolence potentially related to Seroquel, hypothyroidism, and medication side effects.  - Monitor heart rate and adjust medications if fatigue persists and heart rate remains low.  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 Symptoms include leg pain, particularly in the right leg, and dragging of the leg during ambulation. Physical therapy has been beneficial. Her symptoms arte chronic. She has had a reportedly negative lower extremity doppler, unavailable for my review.  Right leg pain with inability to pick up feet (right>left - likely from ventriculomegaly + peripheral neuropathy) patient with history of lumbar disc disease with stenosis - reports improvement during recent trial of prednisone. Suspicious for lumbar Disc Disease in addition to decondition Reviewed PCP note: Ongoing leg pain with intermittent weakness. On exam she does have some swelling around the right knee. Unsure if she could have some severe arthritis catching as she walks. Will obtain x-ray of the right knee today. Trial of prednisone 20 mg once a day for 5 days. We did discuss that she has some degenerative lumbar disc disease with stenosis and likely playing a role in right leg weakness. She has a pacemaker and we will defer MRI of the lumbar spine. Keep follow-up with  neurology and if they feel it is necessary they could possibly pursue nerve conduction study. Inform caretaker to help patient and stay by her  side when walking.   - Patient says that her legs feel heavy and that she has difficulty initiating the first step when walking - consistent with magnetic gait in patient with hydrocephalus and brain atrophy - Previous (-) Lumbar Puncture trial at [facility] (01/09/14). - Continue physical therapy to manage symptoms and improve mobility. - strict Emergency Room/return precautions    PAIN:  Are you having pain? No  PRECAUTIONS: Fall and ICD/Pacemaker  RED FLAGS: None and hx of spinal stenosis   WEIGHT BEARING RESTRICTIONS: No  FALLS: Has patient fallen in last 6 months? No  LIVING ENVIRONMENT: Lives with: caregiver 7 days a week. Alone for an hour or 2 a day, but only when alseep.   Lives in: House/apartment Stairs: Yes: Internal: 1 but ramp in place steps; none and External: 1 steps; none Has following equipment at home: [employer] - 2 wheeled; transport chair; shower chair and grab bars   PLOF: Needs assistance with ADLs, Needs assistance with gait, Needs assistance with transfers, and Needs assistance with ambulation using RW - Primarily uses transport chair for mobility, but will ambulate with assistance to/from bathroom using RW in the home  PATIENT GOALS: Help her walking be easier and help her get stronger   OBJECTIVE:  Note: Objective measures were completed at Evaluation unless otherwise noted.  DIAGNOSTIC FINDINGS:   EXAM (from 10/28/2022): CT LUMBAR SPINE WITHOUT CONTRAST IMPRESSION: 1. Transitional anatomy. Adhering to convention from the prior lumbar spine MRI, the last fully formed disc space is labeled L5-S1. 2. Unchanged chronic compression fractures of L4 and L5. No new fracture of the lumbar spine. 3. Unchanged lumbar spondylosis notable for moderate narrowing of the right lateral recess at L1-2 and L2-3, as well as mild spinal canal stenosis at L2-3 and L3-4.  Electronically Signed   By: Ryan Chess M.D.   On: 10/28/2022 16:42   EXAM (from  09/09/2022): CT HEAD WITHOUT CONTRAST  COMPARISON: CT head 09/05/2022, 08/15/2021   IMPRESSION: 1. No acute abnormality and no change from prior studies. 2. Atrophy and chronic microvascular ischemic change in the white matter.   Electronically Signed   By: Carlin Gaskins M.D.   On: 09/09/2022 11:50  COGNITION: Overall cognitive status: History of cognitive impairments - at baseline   SENSATION: Light touch: WFL on screen, but pt with hx of neuropathy in B LEs  COORDINATION: Not formally assessed, likely impacted by strength deficits as noted below  EDEMA:  Not formally assessed, but no significant edema noted  MUSCLE TONE: Not formally assessed   MUSCLE LENGTH: Not formally assessed   DTRs:  Not formally assessed   POSTURE: rounded shoulders, forward head, increased thoracic kyphosis, posterior pelvic tilt, and flexed trunk    LOWER EXTREMITY ROM:     Active  Right Eval Left Eval  Hip flexion Limited AROM by weakness Limited AROM by weakness (more impaired than R)  Hip extension Decreased ROM Decreased ROM  Hip abduction    Hip adduction    Hip internal rotation    Hip external rotation    Knee flexion [facility]  Knee extension [facility]  Ankle dorsiflexion [facility]  Ankle plantarflexion [facility] WFL  Ankle inversion    Ankle eversion     (Blank rows = not tested)  LOWER EXTREMITY MMT:    MMT Right Eval Left Eval  Hip flexion 3+ 3-  Hip extension    Hip abduction    Hip adduction    Hip internal rotation    Hip external rotation    Knee flexion 4- 4-  Knee extension 4- 4-  Ankle dorsiflexion 4- 3-  Ankle plantarflexion 3+ 4-  Ankle inversion    Ankle eversion    (Blank rows = not tested)  Manual Muscle Test Scale 0/5 = No muscle contraction can be seen or felt 1/5 = Contraction can be felt, but there is no motion 2-/5 = Part moves through incomplete ROM w/ gravity decreased 2/5 = Part moves through complete ROM w/ gravity decreased 2+/5 = Part  moves through incomplete ROM (<50%) against gravity or through complete ROM w/ gravity 3-/5 = Part moves through incomplete ROM (>50%) against gravity 3/5 = Part moves through complete ROM against gravity 3+/5 = Part moves through complete ROM against gravity/slight resistance 4-/5= Holds test position against slight to moderate pressure 4/5 = Part moves through complete ROM against gravity/moderate resistance 4+/5= Holds test position against moderate to strong pressure 5/5 = Part moves through complete ROM against gravity/full resistance  BED MOBILITY:  Findings: Sit to supine Min A for B LE management onto mat Supine to sit Min A for trunk upright with continued impaired motor plan Pt doesn't recall previously trained logroll technique to increase her independence, could be due to impaired carryover of technique in the clinic space Caregiver reports bed mobility has improved slightly at home  Pt with significant difficulty performing anterior scooting towards EOM requiring mod A for this   TRANSFERS: Sit to stand: Min A  Assistive device utilized: Environmental consultant - 2 wheeled     Stand to sit: Min A  Assistive device utilized: Environmental consultant - 2 wheeled     Chair to chair: Min A  Assistive device utilized: Environmental consultant - 2 wheeled      *continues to require cuing to turn fully prior to initiating sitting   RAMP:  Not tested  CURB:  Not tested  STAIRS: Not tested GAIT: Findings: Gait Characteristics: step to pattern, step through pattern, decreased step length- Right, decreased step length- Left, decreased stride length, decreased hip/knee flexion- Right, decreased hip/knee flexion- Left, decreased ankle dorsiflexion- Right, decreased ankle dorsiflexion- Left, trunk flexed, narrow BOS, poor foot clearance- Right, and poor foot clearance- Left, Distance walked: ~87ft x2, Assistive device utilized:Jaidon Ellery - 2 wheeled, Level of assistance: Min A, and Comments: severely decreased gait speed  FUNCTIONAL  TESTS:  5 times sit to stand: 83 seconds Timed up and go (TUG):  73min54 seconds using RW with min A 10 meter walk test: needs to be assessed  PATIENT SURVEYS:  PsFS: average: 3.67  Patient will completely turn 90degrees prior to sitting down during transfers with minimal cuing: 3  Patient will be able to maintain standing balance using B UE support on RW with only SBA while aide provides total A for LB clothing or ADLs: 5 Patient will stand with erect/upright posture when walking: 3  TREATMENT DATE: 05/28/2024    Pt arrives to therapy in her personal transport chair. Caregiver, [name], present.   Sit to stands x 10 to // bars with cues for upright posture at end and minA for lifting/lowering Fwd/bwd gait x 2 laps in // bars with BUE support, slower speed with backwards stepping - very narrow BOS Sidestepping in // bars with 2# AW on each LE with BUE support  Alternating step taps 4 step x 8 each LE with 2# AW and B UE support  Seated LAQ 2# AW x 10 each LE  Seated marching 2# AW x 10 each LE   Gait training 61ft using RW with skilled min A for balance and cuing for improved gait mechanics and AD management:  Pt with improved upright posture today with min cueing  Continues to have more impaired R LE swing phase advancement lacking foot clearance with weakness in hip flexors compared to L LE (in addition to L hip drop during L stance) Pt continues to have NBOS  Pt with more consistent reciprocal stepping pattern today when she is able to sustain her attention on the task which was ~50% of the distance today! +2 providing w/c follow to allow increased distance    PATIENT EDUCATION: Education details: Pt educated throughout session about proper posture and technique with exercises. Improved exercise technique, movement at target joints, use of target muscles after  min to mod verbal, visual, tactile cues.  Person educated: Patient and Caregiver [name] Education method: Explanation Education comprehension: verbalized understanding and needs further education  HOME EXERCISE PROGRAM:  Access Code: [email or web address] URL: [email or web address] Date: 12/20/2023 Prepared by: Connell Kiss   Exercises - Sit to Stand with Armchair  - 1 x daily - 7 x weekly - 3 sets - 5 reps - Standing March with Counter Support  - 1 x daily - 7 x weekly - 2 sets - 10 reps - Supine Bridge  - 1 x daily - 7 x weekly - 2 sets - 10 reps - Supine Heel Slide with Strap  - 1 x daily - 7 x weekly - 2 sets - 10 reps - Supine Hip Abduction AROM  - 1 x daily - 7 x weekly - 3 sets - 10 reps - Seated Long Arc Quad  - 1 x daily - 7 x weekly - 3 sets - 10 reps - Seated March  - 1 x daily - 7 x weekly - 3 sets - 10 reps - 2 hold - Seated Hip Abduction with Resistance  - 1 x daily - 7 x weekly - 2 sets - 10 reps   GOALS: Goals reviewed with patient? Yes  SHORT TERM GOALS: Target date: 04/18/2024  Patient will be independent in home exercise program to improve strength/mobility for better functional independence with ADLs.  Baseline: need to initiate 05/14/2024: Caregiver states engaging patient in this as much as possible Goal status: MET   LONG TERM GOALS: Target date: 05/30/2024  Patient will perform bed mobility with consistently no more than min A utilizing bed features/supports as needed and with no more than than min cuing from aide/caregiver.  Baseline: requires up to mod A and mod/max verbal cuing 05/14/2024: still requiring mod A and mod cuing with caregiver reporting pt having increased leg pain in the morning impacting progress with this Goal status: IN PROGRESS  2.  Patient will increase PsFS score to equal to or greater than and average of 2 points to demonstrate statistically significant improvement in  mobility and quality of life.  Baseline: 3.667 05/14/2024: 4.66 Goal  status: IN PROGRESS  3.  Patient (> 58 years old) will complete five times sit to stand test in < 30 seconds indicating an increased LE strength and improved balance.  Baseline: 83 seconds from her transport chair with RW in front of her and min progressed to light mod A  05/14/2024: 43.34 seconds from her transport chair with RW in front of her and min progressed to light mod A  Goal status: IN PROGRESS   4.  Patient will reduce timed up and go to <60 sec seconds to reduce fall risk and demonstrate improved transfer/gait ability.  Baseline: 57min54 seconds using RW with min A  05/14/2024: 42min57 seconds using RW with min A  Goal status: IN PROGRESS  5.  Patient will increase 10 meter walk test by 0.14m/s as to improve gait speed for better household level ambulation and to reduce fall risk.  Baseline: 0.063m/s with RW  05/14/2024: 0.085 m/s using RW with min A Goal status: IN PROGRESS   ASSESSMENT:  CLINICAL IMPRESSION:    Continued focus on upright posture with activities, improving step length, and BLE strengthening. Improvement noted in maintaining upright posture during exercises with minimal cueing. Continues to demonstrate narrow BOS and decreased step length on R with ambulation. Ms. Klinkner will benefit from further skilled PT to improve these deficits in order to increase QOL and ease/safety with ADLs. .     OBJECTIVE IMPAIRMENTS: Abnormal gait, decreased activity tolerance, decreased balance, decreased cognition, decreased coordination, decreased endurance, decreased knowledge of condition, decreased knowledge of use of DME, decreased mobility, difficulty walking, decreased ROM, decreased strength, impaired flexibility, impaired sensation, impaired UE functional use, and pain.   ACTIVITY LIMITATIONS: carrying, lifting, sitting, standing, transfers, bed mobility, continence, bathing, toileting, dressing, self feeding, reach over head, hygiene/grooming, and locomotion  level  PARTICIPATION LIMITATIONS: meal prep, cleaning, laundry, shopping, and community activity  PERSONAL FACTORS: Age, Time since onset of injury/illness/exacerbation, and 3+ comorbidities: hypothyroidism, recurrent UTIs, osteoarthritis, prior anterior trochanter fracture, CKD stage IIIb, chronic systolic heart failure, dementia, hypertension, history of cardiac pacemaker, history of normal pressure hydrocephalus, overreactive bladder are also affecting patient's functional outcome.   REHAB POTENTIAL: Good  CLINICAL DECISION MAKING: Evolving/moderate complexity  EVALUATION COMPLEXITY: Moderate  PLAN:  PT FREQUENCY: 1-2x/week  PT DURATION: 12 weeks  PLANNED INTERVENTIONS: 97164- PT Re-evaluation, 97750- Physical Performance Testing, 97110-Therapeutic exercises, 97530- Therapeutic activity, 97112- Neuromuscular re-education, 97535- Self Care, [id number]- Manual therapy, [phone]- Gait training, [phone]- Canalith repositioning, Patient/Family education, Balance training, Stair training, Joint mobilization, Vestibular training, Visual/preceptual remediation/compensation, Cognitive remediation, DME instructions, Cryotherapy, Moist heat, and Biofeedback  PLAN FOR NEXT SESSIONS:     *gait training in small spaces where RW will not fit  - continue gait training and functional B LE strengthening in // bars  - continue use of 2lb AWs - continue lateral and forward/backwards stepping over PVC pipe  - wearing 2lb AWs as appropriate  - progress to lateral foot taps on step? - standing marches/foot taps/hip flexion for improved foot clearance  - continue use of AWs (2lbs on 7/28) - sit<>stands  - try weighted vest? - standing reaching overhead/upright for improved hip extension and upright posture - gait training    Maryanne Finder, PT, DPT Physical Therapist - [facility]  [facility] 1:19 PM 05/28/24

## 2024-05-28 ENCOUNTER — Ambulatory Visit

## 2024-05-28 DIAGNOSIS — M6281 Muscle weakness (generalized): Secondary | ICD-10-CM

## 2024-05-28 DIAGNOSIS — R262 Difficulty in walking, not elsewhere classified: Secondary | ICD-10-CM

## 2024-05-28 DIAGNOSIS — R278 Other lack of coordination: Secondary | ICD-10-CM

## 2024-05-28 DIAGNOSIS — R2681 Unsteadiness on feet: Secondary | ICD-10-CM

## 2024-05-28 NOTE — Therapy (Signed)
 OUTPATIENT OCCUPATIONAL THERAPY NEURO DISCHARGE NOTE  Patient Name: Kerri [name] MRN: [id number] DOB:17-Oct-1930, 89 y.o., female Today's Date: 05/28/2024  PCP: Selinda Cyrus Moose, PA-C REFERRING PROVIDER: Maree Hila, MARLA, MD  END OF SESSION:  OT End of Session - 05/28/24 1340     Visit Number 14    Number of Visits 18    Date for OT Re-Evaluation 05/30/24    OT Start Time 1400    OT Stop Time 1430    OT Time Calculation (min) 30 min    Equipment Utilized During Treatment wc    Activity Tolerance Patient tolerated treatment well    Behavior During Therapy WFL for tasks assessed/performed         Past Medical History:  Diagnosis Date   Arthritis    Chronic systolic heart failure (HCC)    CKD (chronic kidney disease), stage III (HCC)    Dementia (HCC)    Hypertension    Hypothyroidism    Overactive bladder    Past Surgical History:  Procedure Laterality Date   INTRAMEDULLARY (IM) NAIL INTERTROCHANTERIC Left 09/06/2022   Procedure: INTRAMEDULLARY (IM) NAIL INTERTROCHANTERIC;  Surgeon: Tobie Priest, MD;  Location: [facility];  Service: Orthopedics;  Laterality: Left;   PACEMAKER LEADLESS INSERTION N/A 01/07/2021   Procedure: PACEMAKER LEADLESS INSERTION;  Surgeon: Ammon Blunt, MD;  Location: [facility];  Service: Cardiovascular;  Laterality: N/A;   PPM GENERATOR REMOVAL N/A 01/07/2021   Procedure: PPM GENERATOR REMOVAL;  Surgeon: Ammon Blunt, MD;  Location: [facility];  Service: Cardiovascular;  Laterality: N/A;   Patient Active Problem List   Diagnosis Date Noted   AKI (acute kidney injury) (HCC) 01/12/2024   Acute postoperative anemia due to expected blood loss 09/07/2022   Displaced intertrochanteric fracture of left femur, initial encounter for closed fracture (HCC) 09/05/2022   Orthostatic hypotension 07/12/2022   Diarrhea 07/10/2022   COVID-19 virus infection 07/08/2022   Electrolyte abnormality 07/08/2022   Malnutrition  of moderate degree 07/08/2022   Acute delirium 07/08/2022   History of recurrent UTI (urinary tract infection) 01/30/2022   Constipation    UTI (urinary tract infection) 10/15/2021   Elevated brain natriuretic peptide (BNP) level 10/15/2021   Generalized weakness 10/15/2021   Chronic kidney disease, stage 3b (HCC) 10/15/2021   Normal pressure hydrocephalus (HCC) 10/15/2021   Dementia without behavioral disturbance (HCC) 10/15/2021   Hypothyroidism 10/15/2021   Essential hypertension 10/15/2021   Overactive bladder 10/15/2021   Moderate mitral regurgitation 01/13/2021   Status post placement of cardiac pacemaker 01/13/2021   CHB (complete heart block) (HCC) 01/07/2021   Chronic systolic CHF (congestive heart failure) (HCC) 12/17/2020   Postmenopausal osteoporosis 08/26/2016   Mixed Alzheimer's and vascular dementia (HCC) 06/13/2014   ONSET DATE: 09/05/2022  REFERRING DIAG: Intertrochanteric Left Femur Fracture, Lumbar Disc Disease with stenosis  THERAPY DIAG:  Muscle weakness (generalized)  Other lack of coordination  Rationale for Evaluation and Treatment: Rehabilitation  SUBJECTIVE:  SUBJECTIVE STATEMENT: Pt and caregiver both in agreement with plan to d/c after d/c assessment completed this date. Pt accompanied by: [name]-Personal Caregiver  PERTINENT HISTORY:  03/07/24:  Pt returns today to resume therapy services after recent hospitalization for AKI.   Per chart: 89 with past medical history significant for hypothyroidism, recurrent UTIs, osteoarthritis, prior anterior trochanter fracture, CKD stage IIIb, chronic systolic heart failure, dementia, hypertension, history of cardiac pacemaker, history of normal pressure hydrocephalus, overreactive bladder. Patient was previously on suppressive Bactrim . She has been receiving workup outpatient with  neurology. Neurology requested for urinalysis but patient was unable to provide a voided specimen. She went to urology for catheter  catheterization UA. At urology office reported feeling weak. Outpatient labs revealed creatinine 2.9. She was sent to the ED for further evaluation of AKI.   Pt. Is a 89 y.o. female who sustained an Intertrochanteric Left Femur Fracture, Lumbar Disc Disease with Stenosis, Chronic compression Fractures. 09/05/2022, PMHx includes: Mixed Dementia (Alzheimer's Disease, and Vascular Dementia), Pacemaker, Mild spinal canal stenosis L2-3 & L3-4, Hx of UTI.  PRECAUTIONS: Pacemaker (6-8 years)  WEIGHT BEARING RESTRICTIONS: No  PAIN: 05/28/24: No pain reported today.  Are you having pain? Yes, in the a.m. R leg, back   FALLS: Has patient fallen in last 6 months? No, 3 assisted sitdowns to the flloor- When Pt. feels like she is not able to walk any further.  LIVING ENVIRONMENT: Lives with: [name] Lives in: House Stairs:  One story with a basement; 2 steps to enter from the garage. In step down to the den; and and one step ramp into the kitchen Has following equipment at home: Wheelchair (manual), walker, hospital bed with rails, and a lift chair, BCommode, and shower chair. Gait belt, Life Alert, hand held shower head.  PLOF: Independent  PATIENT GOALS:  To be able to stand, and walk to the bathroom. Left hand -Pt. Is not using her left hand much  OBJECTIVE:  Note: Objective measures were completed at Evaluation unless otherwise noted.  HAND DOMINANCE: Right  ADLs: Now, occasional min A, CGA , eating no change, set up for grooming, set up for UB dressing,same for dressing, CGA toilet transfer, bathing same, mod A to lift legs over tub while seated on chair, before hip fx, occasional help with transfers at night or if tired.  Transfers/ambulation related to ADLs:  Eating: Independent, assist with set-up for cutting food Grooming: Independent set-up, seated  UB Dressing: Independent with set-up LB Dressing: Mod/MaxA pants, and socks Toileting: Assist with toilet transfers, clothing negotiation,  Independent toilet hygiene. Some incontinence episodes Bathing: MaxA Tub Shower transfers: Max A shower transfers  IADLs: Shopping: Dependent. Will accompany at times. Light housekeeping: Occasionally wipes the table, and puts items in the trash, folds laundry when brought sitting Meal Prep: Will help assist with baking/mixing items, no cooking, meals provided for the Pt. Community mobility: Relies on family and friends Medication management: Medication management provided for the Pt. Financial management:  No change from baseline  MOBILITY STATUS: Hx of Falls, Needs assist  FUNCTIONAL OUTCOME MEASURES: TBD  UPPER EXTREMITY ROM:    Active ROM Right eval Right 03/07/24 Left eval Left 03/07/24 Left 05/28/24  Shoulder flexion 119(140) 130 (143) 112(130) 117 (130) 125 (130)  Shoulder abduction 112 160 (170) 98 135 (155) 135 (150)  Shoulder adduction       Shoulder extension       Shoulder internal rotation       Shoulder external rotation       Elbow flexion WNL  WNL    Elbow extension WNL  WNL    Wrist flexion       Wrist extension WNL  WNL    Wrist ulnar deviation       Wrist radial deviation       Wrist pronation       Wrist supination       (Blank rows = not tested)  UPPER EXTREMITY MMT:     MMT Right eval Right 03/07/24 Right 04/23/24 Right 05/28/24 Left eval  Left 03/07/24 Left 04/23/24 Left 05/28/24  Shoulder flexion 3-/5 4+/5 within available range 4+ 4+ 3-/5 3+ within available range 4- 4-  Shoulder abduction 3-/5 4+/5 within available range 4+ 4+ 3-/5 3+ within available range 4- 4-  Shoulder adduction          Shoulder extension          Shoulder internal rotation          Shoulder external rotation          Middle trapezius          Lower trapezius          Elbow flexion 4/5 4+ 4+ 4+ 3+/5 4+ 4+ 4+  Elbow extension 4/5 4+ 4+ 4+ 3+/5 4+ 4+ 4+  Wrist flexion      4+ 4+ 4+  Wrist extension 4/5 4+ 4+ 4+ 3+/5 4+ 4+ 4+  Wrist ulnar deviation          Wrist  radial deviation          Wrist pronation          Wrist supination          (Blank rows = not tested)  HAND FUNCTION: Grip strength: Right: 29 lbs; Left: 21 lbs; Pinch strength: Lateral: R: 10#, L: 6#, 3pt. Pinch: R: 8#, L: 5# 03/07/24: Grip strength: Right: 45 lbs; Left: 27 lbs; Pinch strength: Lateral: R: 10 lbs, L: 7 lbs, 3 point pinch: Right: 6 lbs, L: 5# 04/24/23: Grip strength: 43 lbs, Left: 25 lbs; Pinch strength: Lateral: R: 11 lbs, L: 11 lbs; 3 point pinch: Right: 9 lbs, L: 6 lbs 05/28/24: Grip strength: Right: 41 lbs, Left: 30 lbs; Pinch strength: Lateral: R: 11 lbs, L: 8 lbs; 3 point pinch: Right: 10 lbs, L: 7 lbs  COORDINATION:  9 Hole Peg test: Right: 1 min. & 2  sec; Left: 2 min. & 1 sec 03/07/24: 9 hole peg test: Right: 46 sec, Left: 1 min 24 sec  04/23/24: 9 hole peg test: Right: 46 sec, Left: 58 sec  05/28/24: 9 hole peg test: Left: 41 sec, Left: 55 sec   SENSATION: Light touch: Inconsistent  COGNITION: Overall cognitive status: History of cognitive impairments - at baseline Alzheimer's Dementia, and Vascular Dementia  VISION: Subjective report:  Glasses all the time  VISION ASSESSMENT: Wears glasses all the time at baseline                                                                                                            TREATMENT DATE: 05/28/24 Therapeutic Activity: -Objective measures taken and goals updated and reviewed for discharge summary.  Self Care: -HEP review, encouraged completion of bimanual tasks when able (ex: folding laundry)  PATIENT EDUCATION: Education details: HEP, progress towards goals  Person educated: Patient and Caregiver [name] Education method: Explanation, vc Education comprehension: verbalized understanding  HOME EXERCISE PROGRAM: -Yellow theraputty with visual handout+, stress ball -[facility] handout  GOALS: Goals reviewed with patient? Yes  SHORT TERM GOALS: Target date: 04/18/2024    Pt.  Will require Supervision for BUE  HEPs Baseline: Eval: No current HEP; 03/07/24: Will re-establish HEP d/t therapy interrupted with recent hospitalization; 04/23/24: Pt no longer working with theraputty d/t putty making a mess with a blanket and clothing.  Pt has a ball she squeezes for grip strengthening.  Pt also has [facility] handout and caregiver reports she works on Chief of Staff from written handout Goal status: achieved   LONG TERM GOALS: Target date: 05/30/2024  Pt. Will increase L shoulder strength by 1 mm grade to assist with ADLs/IADLs (revised on 03/07/24). Baseline: Eval: Right: shoulder flexion 3-/5, abduction 3-/5, elbow flexion 4/5, extension 4/5, wrist extension 4/5; Left: shoulder flexion: 3-/5, abduction: 3-/5, elbow flexion 3+/5, extension 3+/5, wrist extension 3+/5; 03/07/24: L shoulder flex/abd 3+/5 within available range; 04/23/24: L shoulder grossly 4-, elbow/wrist 4+; 05/28/24: same as 04/23/24 Goal status: achieved  2.  Pt will increase L grip strength by 5 or more lbs to assist with hiking pants (revised on 03/07/24). Baseline: R: 29#, L: 21#; 03/07/24: L grip 27#; 04/23/24: L grip 25#; 05/28/24: L grip 30 lbs Goal status: INITIAL  3.  Pt. Will initiate engaging the left hand during daily activity 75% of the time with minimal cuing Baseline: Eval: Limited initiation and engagement of the LUE during daily tasks.  10-15% engaging; 03/07/24: same as eval; 04/23/24: Caregiver estimates that pt uses her hand a little more, maybe 20% of the time.  Caregiver reports that she sees pt holding a napkin when she eats; 05/28/24:  Pt engaging the left hand appropriately with vc from caregiver, estimating ~30% of the time  Goal status: d/c/improved  4.  Pt will demonstrate compensatory adaptive techniques to assist with ADLs/IADLs. Baseline: Eval: Education to be provided; 03/07/24: ongoing; 04/23/24: ongoing; 05/28/24: Full time caregiver assists as needed for safety and thoroughness d/t dementia Goal status:  d/c  ASSESSMENT: CLINICAL IMPRESSION: Pt seen this date for d/c assessment.  Pt shows improvement in bilat hand strength and coordination with all measures, and is observed to have improved with engaging the L hand into daily tasks with caregiver supv, and on occasion initiating use of L hand independently to sip from a cup today, for example.  Caregiver is indep with HEP and understands encouragement to continue with HEP to promote participation in bimanual ADLs and increased engagement of the L hand into FMC/GMC tasks in order to avoid decline from non-use.  OT goals sufficiently met.  No further skilled OT indicated at this time.   PERFORMANCE DEFICITS: in functional skills including ADLs, IADLs, coordination, dexterity, proprioception, ROM, strength, Fine motor control, Gross motor control, mobility, decreased knowledge of use of DME, and UE functional use, cognitive skills including attention, problem solving, and safety awareness, and psychosocial skills including environmental adaptation and routines and behaviors.   IMPAIRMENTS: are limiting patient from ADLs, IADLs, education, and leisure.   CO-MORBIDITIES: has  other co-morbidities that affects occupational performance. Patient will benefit from skilled OT to address above impairments and improve overall function.  MODIFICATION OR ASSISTANCE TO COMPLETE EVALUATION: Min-Moderate modification of tasks or assist with assess necessary to complete an evaluation.  OT OCCUPATIONAL PROFILE AND HISTORY: Detailed assessment: Review of records and additional review of physical, cognitive, psychosocial history related to current functional performance.  CLINICAL DECISION MAKING: Moderate - several treatment options, min-mod task modification necessary  REHAB POTENTIAL: Good  EVALUATION COMPLEXITY: Moderate  PLAN:  OT FREQUENCY: 1x/week  OT DURATION: 12 weeks  PLANNED INTERVENTIONS: 02831 OT Re-evaluation, 97535 self  care/ADL training, 02889  therapeutic exercise, 97530 therapeutic activity, 97112 neuromuscular re-education, 97140 manual therapy, 97018 paraffin, 02989 moist heat, 97034 contrast bath, passive range of motion, functional mobility training, patient/family education, and DME and/or AE instructions  RECOMMENDED OTHER SERVICES: Pt currently working with PT  CONSULTED AND AGREED WITH PLAN OF CARE: Patient and family member/caregiver  PLAN FOR NEXT SESSION: N/A; d/c  Inocente Blazing, MS, OTR/L  05/28/2024, 2:35 PM

## 2024-05-30 ENCOUNTER — Ambulatory Visit: Admitting: Physical Therapy

## 2024-05-30 DIAGNOSIS — R5381 Other malaise: Secondary | ICD-10-CM

## 2024-05-30 DIAGNOSIS — R278 Other lack of coordination: Secondary | ICD-10-CM

## 2024-05-30 DIAGNOSIS — R262 Difficulty in walking, not elsewhere classified: Secondary | ICD-10-CM

## 2024-05-30 DIAGNOSIS — M6281 Muscle weakness (generalized): Secondary | ICD-10-CM

## 2024-05-30 DIAGNOSIS — R269 Unspecified abnormalities of gait and mobility: Secondary | ICD-10-CM

## 2024-05-30 DIAGNOSIS — R2681 Unsteadiness on feet: Secondary | ICD-10-CM

## 2024-05-30 NOTE — Therapy (Signed)
 OUTPATIENT PHYSICAL THERAPY NEURO TREATMENT/RECERT   Patient Name: Kerri [name] MRN: [id number] DOB:[birth year], 89 y.o., female Today's Date: 05/30/2024   PCP: Cyrus Selinda Moose, PA-C  REFERRING PROVIDER: Cyrus Selinda Moose, PA-C   END OF SESSION:   PT End of Session - 05/30/24 1320     Visit Number 15    Number of Visits 48    Date for PT Re-Evaluation 08/22/24    Authorization Type [employer] [id number] for 24 PT vsts from 7/21-10/13 (8/20 is 7 of 24)    PT Start Time 1319    PT Stop Time 1404    PT Time Calculation (min) 45 min    Equipment Utilized During Treatment Gait belt    Activity Tolerance Patient tolerated treatment well;No increased pain    Behavior During Therapy WFL for tasks assessed/performed           Past Medical History:  Diagnosis Date   Arthritis    Chronic systolic heart failure (HCC)    CKD (chronic kidney disease), stage III (HCC)    Dementia (HCC)    Hypertension    Hypothyroidism    Overactive bladder    Past Surgical History:  Procedure Laterality Date   INTRAMEDULLARY (IM) NAIL INTERTROCHANTERIC Left 09/06/2022   Procedure: INTRAMEDULLARY (IM) NAIL INTERTROCHANTERIC;  Surgeon: Tobie Priest, MD;  Location: [facility];  Service: Orthopedics;  Laterality: Left;   PACEMAKER LEADLESS INSERTION N/A 01/07/2021   Procedure: PACEMAKER LEADLESS INSERTION;  Surgeon: Ammon Blunt, MD;  Location: [facility];  Service: Cardiovascular;  Laterality: N/A;   PPM GENERATOR REMOVAL N/A 01/07/2021   Procedure: PPM GENERATOR REMOVAL;  Surgeon: Ammon Blunt, MD;  Location: [facility];  Service: Cardiovascular;  Laterality: N/A;   Patient Active Problem List   Diagnosis Date Noted   AKI (acute kidney injury) (HCC) 01/12/2024   Acute postoperative anemia due to expected blood loss 09/07/2022   Displaced intertrochanteric fracture of left femur, initial encounter for closed fracture (HCC) 09/05/2022   Orthostatic  hypotension 07/12/2022   Diarrhea 07/10/2022   COVID-19 virus infection 07/08/2022   Electrolyte abnormality 07/08/2022   Malnutrition of moderate degree 07/08/2022   Acute delirium 07/08/2022   History of recurrent UTI (urinary tract infection) 01/30/2022   Constipation    UTI (urinary tract infection) 10/15/2021   Elevated brain natriuretic peptide (BNP) level 10/15/2021   Generalized weakness 10/15/2021   Chronic kidney disease, stage 3b (HCC) 10/15/2021   Normal pressure hydrocephalus (HCC) 10/15/2021   Dementia without behavioral disturbance (HCC) 10/15/2021   Hypothyroidism 10/15/2021   Essential hypertension 10/15/2021   Overactive bladder 10/15/2021   Moderate mitral regurgitation 01/13/2021   Status post placement of cardiac pacemaker 01/13/2021   CHB (complete heart block) (HCC) 01/07/2021   Chronic systolic CHF (congestive heart failure) (HCC) 12/17/2020   Postmenopausal osteoporosis 08/26/2016   Mixed Alzheimer's and vascular dementia (HCC) 06/13/2014    ONSET DATE: Progressive decline over ~1-1.5 years  REFERRING DIAG:  M51.369 (ICD-10-CM) - Other intervertebral disc degeneration, lumbar region without mention of lumbar back pain or lower extremity pain  R53.81 (ICD-10-CM) - Other malaise  M62.81 (ICD-10-CM) - Muscle weakness (generalized)    THERAPY DIAG:  Muscle weakness (generalized)  Other lack of coordination  Difficulty in walking, not elsewhere classified  Unsteadiness on feet  Debility  Abnormality of gait and mobility  Rationale for Evaluation and Treatment: Rehabilitation  SUBJECTIVE:  SUBJECTIVE STATEMENT:  Pt's caregiver, [name], requests patient work on getting in/out of bed as well as the bath tub. [name] states pt sometimes wants to be able to stand and step  her foot over the tub rather than sitting on the bench to put her feet in; therefore, it would be beneficial to work on both techniques. [name] reports pt has more difficulty stepping R LE into the tub than stepping L LE out of the tub (which makes sense with what we know regarding pt's LE weakness).    Caregiver states the morning after therapy, patient is able to get OOB easier compared to on Saturdays after it has been a few days since therapy. Caregiver continues to report patient has noticeable improvement in her independence with functional mobility the days following therapy.   Patient reports she is doing good.  No falls/stumbles and no medical updates reported.   From Prior sessions: Caregiver reports pt does have hx of having lumbar cortisone injections, which would improve patient's walking temporarily, but pt hasn't had one in >101months. Hx of L LE fx, but now reporting pain in R LE   From Eval: Patient previously known to this clinic. Patient with recent short hospitalization following AKI superimposed on CKI stage III with UTI. Pt received HHPT prior to returning to OPPT.  [name], caregiver, reports having taken patient to MD due to pt having decreased interest in doing things and then MD found out pt's kidney levels were low, resulting in her hospitalization.   Denies pain. Denies falls/stumbles at home.  Pt accompanied by: caregiver, [name]   PERTINENT HISTORY:  PMH: hypothyroidism, recurrent UTIs, osteoarthritis, prior anterior trochanter fracture, CKD stage IIIb, chronic systolic heart failure, dementia, hypertension, history of cardiac pacemaker, history of normal pressure hydrocephalus, overreactive bladder   Most recent A&P from Neurology note on 12/21/2023: Mixed dementia (Alzheimer's disease + vascular dementia) with significant ventriculomegaly (with negative lumbar drain trial in 2015) in a patient with history of sepsis secondary to UTI treated with trimethoprim  January 2023.  Medication appears to help with UTI and improved mental status - reports good control with current medication Progressive behavioral disturbances with mood swings, outbursts, and apathy, exacerbated during the day. Seroquel (quetiapine) was initiated but caused significant lethargy. Alternative medications, lamotrigine and Depakote, were discussed for her mood-stabilizing properties and different tolerability profiles. - Discuss with primary care provider about reducing Seroquel dose to assess tolerability. Consider 1/2 tablet (12.5 mg) instead.  - If unable to tolerate Seroquel at lower dose, will consider trial of Depakote Valproic Acid is an antiepileptic drug, used for many different conditions. Patient was advised not to stop medication abruptly, not to use alcohol. Acute side-effects can be abdominal pain, N/V, diarrhea and increased ammonia level. Long term side-effects are hair loss, weight gain, tremors and amenorrhea. Rarely, it can cause low platelets, liver failure and birth defects.  - Consider alternative medications such as lamotrigine or Depakote if Seroquel remains intolerable. - Document alternative medications in the chart for future reference.  Fatigue and somnolence Increased fatigue and somnolence potentially related to Seroquel, hypothyroidism, and medication side effects.  - Monitor heart rate and adjust medications if fatigue persists and heart rate remains low.  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 Symptoms include leg pain, particularly in the right leg, and dragging of the leg during ambulation. Physical therapy has been beneficial. Her symptoms arte chronic. She has had a reportedly negative lower  extremity doppler, unavailable for my review.  Right leg pain with inability to pick up feet (right>left - likely from ventriculomegaly + peripheral neuropathy)  patient with history of lumbar disc disease with stenosis - reports improvement during recent trial of prednisone. Suspicious for lumbar Disc Disease in addition to decondition Reviewed PCP note: Ongoing leg pain with intermittent weakness. On exam she does have some swelling around the right knee. Unsure if she could have some severe arthritis catching as she walks. Will obtain x-ray of the right knee today. Trial of prednisone 20 mg once a day for 5 days. We did discuss that she has some degenerative lumbar disc disease with stenosis and likely playing a role in right leg weakness. She has a pacemaker and we will defer MRI of the lumbar spine. Keep follow-up with neurology and if they feel it is necessary they could possibly pursue nerve conduction study. Inform caretaker to help patient and stay by her side when walking.   - Patient says that her legs feel heavy and that she has difficulty initiating the first step when walking - consistent with magnetic gait in patient with hydrocephalus and brain atrophy - Previous (-) Lumbar Puncture trial at [facility] (01/09/14). - Continue physical therapy to manage symptoms and improve mobility. - strict Emergency Room/return precautions    PAIN:  Are you having pain? No  PRECAUTIONS: Fall and ICD/Pacemaker  RED FLAGS: None and hx of spinal stenosis   WEIGHT BEARING RESTRICTIONS: No  FALLS: Has patient fallen in last 6 months? No  LIVING ENVIRONMENT: Lives with: caregiver 7 days a week. Alone for an hour or 2 a day, but only when alseep.   Lives in: House/apartment Stairs: Yes: Internal: 1 but ramp in place steps; none and External: 1 steps; none Has following equipment at home: Walker - 2 wheeled; transport chair; shower chair and grab bars   PLOF: Needs assistance with ADLs, Needs assistance with gait, Needs assistance with transfers, and Needs assistance with ambulation using RW - Primarily uses transport chair for mobility, but will ambulate  with assistance to/from bathroom using RW in the home  PATIENT GOALS: Help her walking be easier and help her get stronger   OBJECTIVE:  Note: Objective measures were completed at Evaluation unless otherwise noted.  DIAGNOSTIC FINDINGS:   EXAM (from 10/28/2022): CT LUMBAR SPINE WITHOUT CONTRAST IMPRESSION: 1. Transitional anatomy. Adhering to convention from the prior lumbar spine MRI, the last fully formed disc space is labeled L5-S1. 2. Unchanged chronic compression fractures of L4 and L5. No new fracture of the lumbar spine. 3. Unchanged lumbar spondylosis notable for moderate narrowing of the right lateral recess at L1-2 and L2-3, as well as mild spinal canal stenosis at L2-3 and L3-4.  Electronically Signed   By: Ryan Chess M.D.   On: 10/28/2022 16:42   EXAM (from 09/09/2022): CT HEAD WITHOUT CONTRAST  COMPARISON: CT head 09/05/2022, 08/15/2021   IMPRESSION: 1. No acute abnormality and no change from prior studies. 2. Atrophy and chronic microvascular ischemic change in the white matter.   Electronically Signed   By: Carlin Gaskins M.D.   On: 09/09/2022 11:50  COGNITION: Overall cognitive status: History of cognitive impairments - at baseline   SENSATION: Light touch: WFL on screen, but pt with hx of neuropathy in B LEs  COORDINATION: Not formally assessed, likely impacted by strength deficits as noted below  EDEMA:  Not formally assessed, but no significant edema noted  MUSCLE TONE: Not formally assessed  MUSCLE LENGTH: Not formally assessed   DTRs:  Not formally assessed   POSTURE: rounded shoulders, forward head, increased thoracic kyphosis, posterior pelvic tilt, and flexed trunk    LOWER EXTREMITY ROM:     Active  Right Eval Left Eval  Hip flexion Limited AROM by weakness Limited AROM by weakness (more impaired than R)  Hip extension Decreased ROM Decreased ROM  Hip abduction    Hip adduction    Hip internal rotation    Hip external  rotation    Knee flexion [facility] WFL  Knee extension [facility]  Ankle dorsiflexion [facility] WFL  Ankle plantarflexion [facility] WFL  Ankle inversion    Ankle eversion     (Blank rows = not tested)  LOWER EXTREMITY MMT:    MMT Right Eval Left Eval  Hip flexion 3+ 3-  Hip extension    Hip abduction    Hip adduction    Hip internal rotation    Hip external rotation    Knee flexion 4- 4-  Knee extension 4- 4-  Ankle dorsiflexion 4- 3-  Ankle plantarflexion 3+ 4-  Ankle inversion    Ankle eversion    (Blank rows = not tested)  Manual Muscle Test Scale 0/5 = No muscle contraction can be seen or felt 1/5 = Contraction can be felt, but there is no motion 2-/5 = Part moves through incomplete ROM w/ gravity decreased 2/5 = Part moves through complete ROM w/ gravity decreased 2+/5 = Part moves through incomplete ROM (<50%) against gravity or through complete ROM w/ gravity 3-/5 = Part moves through incomplete ROM (>50%) against gravity 3/5 = Part moves through complete ROM against gravity 3+/5 = Part moves through complete ROM against gravity/slight resistance 4-/5= Holds test position against slight to moderate pressure 4/5 = Part moves through complete ROM against gravity/moderate resistance 4+/5= Holds test position against moderate to strong pressure 5/5 = Part moves through complete ROM against gravity/full resistance  BED MOBILITY:  Findings: Sit to supine Min A for B LE management onto mat Supine to sit Min A for trunk upright with continued impaired motor plan Pt doesn't recall previously trained logroll technique to increase her independence, could be due to impaired carryover of technique in the clinic space Caregiver reports bed mobility has improved slightly at home  Pt with significant difficulty performing anterior scooting towards EOM requiring mod A for this   TRANSFERS: Sit to stand: Min A  Assistive device utilized: Environmental consultant - 2 wheeled     Stand to sit: Min A  Assistive  device utilized: Environmental consultant - 2 wheeled     Chair to chair: Min A  Assistive device utilized: Environmental consultant - 2 wheeled      *continues to require cuing to turn fully prior to initiating sitting   RAMP:  Not tested  CURB:  Not tested  STAIRS: Not tested GAIT: Findings: Gait Characteristics: step to pattern, step through pattern, decreased step length- Right, decreased step length- Left, decreased stride length, decreased hip/knee flexion- Right, decreased hip/knee flexion- Left, decreased ankle dorsiflexion- Right, decreased ankle dorsiflexion- Left, trunk flexed, narrow BOS, poor foot clearance- Right, and poor foot clearance- Left, Distance walked: ~73ft x2, Assistive device utilized:Walker - 2 wheeled, Level of assistance: Min A, and Comments: severely decreased gait speed  FUNCTIONAL TESTS:  5 times sit to stand: 83 seconds Timed up and go (TUG):  45min54 seconds using RW with min A 10 meter walk test: needs to be assessed  PATIENT SURVEYS:  PsFS: average: 3.67  Patient will completely turn 90degrees prior to sitting down during transfers with minimal cuing: 3  Patient will be able to maintain standing balance using B UE support on RW with only SBA while aide provides total A for LB clothing or ADLs: 5 Patient will stand with erect/upright posture when walking: 3                                                                                                                              TREATMENT DATE: 05/30/2024    Pt arrives to therapy in her personal transport chair. Caregiver, [name], present.  Therapy session focused on re-assessment of standardized outcome measures and subjective questionnaire to determine pt's progress with therapy thus far with plan to submit for re-certification.  Therapist educated Caregiver on recommendation to trial patient coming to [facility] on Fridays to participate in exercise on Nustep as a way to improve carryover from therapy sessions on Wednesday through the  weekend.  Caregiver reports pt continues to have pain in her R LE in the mornings that starts in her thigh and goes down to her knee. Reports pt is still requiring mod A for bed mobility, especially in the mornings. Caregiver requests therapist continue working with patient on increasing independence with bed mobility in future sessions.  PsFS: average: 5.33  Patient will completely turn 90degrees prior to sitting down during transfers with minimal cuing: 6  Patient will be able to maintain standing balance using B UE support on RW with only SBA while aide provides total A for LB clothing or ADLs: 5 Patient will stand with erect/upright posture when walking: 5   Five times Sit to Stand Test (FTSS) "Stand up and sit down as quickly as possible 5 times, keeping your arms folded across your chest."    TIME: 1st trial: 51.09 seconds  from transport chair with RW in front of her and heavy min progressed to mod A due to lack of anterior weight shifting as she rises to stand  Pt requiring increased cuing to push up from w/c armrests during the initial round of sit<>stands, pt often benefits from a warm up round of sit<>stands to prime her nervous system and muscles for the activity  2nd trial: 36.93 seconds from transport chair with RW in front as just described  Times > 13.6 seconds is associated with increased disability and morbidity (Guralnik, 2000) Times > 15 seconds is predictive of recurrent falls in healthy individuals aged 84 and older (Buatois, et al., 2008) Normal performance values in community dwelling individuals aged 80 and older (Bohannon, 2006): 60-69 years: 11.4 seconds 70-79 years: 12.6 seconds 80-89 years: 14.8 seconds  MCID: >= 2.3 seconds for Vestibular Disorders (Meretta, 2006)   Participated in Timed Up and Go (TUG): 1st trial: 2 min 43 seconds using RW with min A for turning to sit in transport chair, maintaining upright posture, and balance during Adjusted RW  height 2nd trial:  2 min 25 seconds using RW with min A as just described Pt reported that she felt like she was being asked to complete tandem walking on tape; educated to walk with normal pattern on 3rd trial.  3rd trial: 2 min 2 seconds  using RW Average: 2 min 2 seconds using RW with skilled min A for lifting to stand and balance/AD management during gait (anticipate other times were longer due to pt misunderstanding of the task) Patient demonstrates high fall risk as indicated by requiring >13.5seconds to complete the TUG.    10 Meter Walk Test: Patient instructed to walk 10 meters (32.8 ft) as quickly and as safely as possible at their normal speed x2. Time measured from 2 meter mark to 8 meter mark to accommodate ramp-up and ramp-down.  Normal speed 1: 0.107 m/s ( 33 seconds) Normal speed 2: 0.107 m/s (1 min 33 seconds) Average Normal speed: 0.107 m/s using RW with skilled min A for balance, AD management, and facilitating wt shifting onto stance limbs along with +2 w/c follow to allow seated rest break after each trial Pt with improved consistency/forward fluidity of her steps to achieve reciprocal stepping pattern Continues to have decreased R LE foot clearance compared to L LE Continues to have excessive forward trunk flexed posture with hip flexion, although improving Continues to have narrow BOS  Cut off scores: <0.4 m/s = household Ambulator, 0.4-0.8 m/s = limited community Ambulator, >0.8 m/s = community Ambulator, >1.2 m/s = crossing a street, <1.0 = increased fall risk MCID 0.05 m/s (small), 0.13 m/s (moderate), 0.06 m/s (significant)  (ANPTA Core Set of Outcome Measures for Adults with Neurologic Conditions, 2018)     PATIENT EDUCATION: Education details: Pt educated throughout session about proper posture and technique with exercises. Improved exercise technique, movement at target joints, use of target muscles after min to mod verbal, visual, tactile cues.  Person  educated: Patient and Caregiver [name] Education method: Explanation Education comprehension: verbalized understanding and needs further education  HOME EXERCISE PROGRAM:  Access Code: [email or web address] URL: [email or web address] Date: 12/20/2023 Prepared by: Connell Kiss   Exercises - Sit to Stand with Armchair  - 1 x daily - 7 x weekly - 3 sets - 5 reps - Standing March with Counter Support  - 1 x daily - 7 x weekly - 2 sets - 10 reps - Supine Bridge  - 1 x daily - 7 x weekly - 2 sets - 10 reps - Supine Heel Slide with Strap  - 1 x daily - 7 x weekly - 2 sets - 10 reps - Supine Hip Abduction AROM  - 1 x daily - 7 x weekly - 3 sets - 10 reps - Seated Long Arc Quad  - 1 x daily - 7 x weekly - 3 sets - 10 reps - Seated March  - 1 x daily - 7 x weekly - 3 sets - 10 reps - 2 hold - Seated Hip Abduction with Resistance  - 1 x daily - 7 x weekly - 2 sets - 10 reps   GOALS: Goals reviewed with patient? Yes  SHORT TERM GOALS: Target date: 07/11/2024   Patient will be independent in home exercise program to improve strength/mobility for better functional independence with ADLs.  Baseline: need to initiate 05/14/2024: Caregiver states engaging patient in this as much as possible Goal status: MET   LONG TERM GOALS: Target date: 08/22/2024  Patient will perform bed mobility with consistently no more than min A utilizing bed  features/supports as needed and with no more than than min cuing from aide/caregiver.  Baseline: requires up to mod A and mod/max verbal cuing 05/14/2024: still requiring mod A and mod cuing with caregiver reporting pt having increased leg pain in the morning impacting progress with this 05/30/2024: still requiring mod A and mod cuing, pt continues to have R LE pain in the mornings impacting independence with this activity Goal status: IN PROGRESS  2.  Patient will increase PsFS score to equal to or greater than and average of 2 points to demonstrate statistically  significant improvement in mobility and quality of life.  Baseline: 3.667 05/14/2024: 4.66 05/30/2024:  5.33 Goal status: IN PROGRESS  3.  Patient (> 64 years old) will complete five times sit to stand test in < 30 seconds indicating an increased LE strength and improved balance.  Baseline: 83 seconds from her transport chair with RW in front of her and min progressed to light mod A  05/14/2024: 43.34 seconds from her transport chair with RW in front of her and min progressed to light mod A  05/30/2024: 36.93 seconds from transport chair to RW with min progressed to light mod A (on 2nd trial) Goal status: IN PROGRESS   4.  Patient will reduce timed up and go to <60 sec seconds to reduce fall risk and demonstrate improved transfer/gait ability.  Baseline: 20min54 seconds using RW with min A  05/14/2024: 2min57 seconds using RW with min A  05/30/2024:  2 min 2 seconds using RW with skilled min A for lifting to stand and balance/AD management during gait Goal status: IN PROGRESS  5.  Patient will increase 10 meter walk test by 0.71m/s as to improve gait speed for better household level ambulation and to reduce fall risk.  Baseline: 0.091m/s with RW  05/14/2024: 0.085 m/s using RW with min A 05/30/2024: 0.107 m/s using RW with skilled min A  Goal status: IN PROGRESS   ASSESSMENT:  CLINICAL IMPRESSION:    Therapy session focused on re-assessment of standardized outcome measures and subjective questionnaire to determine pt's progress with therapy thus far in order to submit for re-cert. Patient demonstrates improvement on all LTGs showing increasing B LE functional strength, improving gait speed, and overall increasing independence with functional mobility with decreased caregiver burden. Patient demonstrates improved 5xSTS although still requires up to light mod A for lifting to stand and improved gait speed on using RW with demonstrating increased consistency of reciprocal stepping pattern. Patient  continues to have smaller improvement on the TUG due to patient having greatest challenge with turning and sit<>stand transfers requiring increased time. Patient's caregiver reports patient continues to have difficulty with bed mobility and shower transfers and requests to address these concerns at upcoming therapy visits. Ms. Stanwood will benefit from further skilled PT to improve these deficits in order to increase QOL and ease/safety with ADLs. .     OBJECTIVE IMPAIRMENTS: Abnormal gait, decreased activity tolerance, decreased balance, decreased cognition, decreased coordination, decreased endurance, decreased knowledge of condition, decreased knowledge of use of DME, decreased mobility, difficulty walking, decreased ROM, decreased strength, impaired flexibility, impaired sensation, impaired UE functional use, and pain.   ACTIVITY LIMITATIONS: carrying, lifting, sitting, standing, transfers, bed mobility, continence, bathing, toileting, dressing, self feeding, reach over head, hygiene/grooming, and locomotion level  PARTICIPATION LIMITATIONS: meal prep, cleaning, laundry, shopping, and community activity  PERSONAL FACTORS: Age, Time since onset of injury/illness/exacerbation, and 3+ comorbidities: hypothyroidism, recurrent UTIs, osteoarthritis, prior anterior trochanter fracture, CKD stage  IIIb, chronic systolic heart failure, dementia, hypertension, history of cardiac pacemaker, history of normal pressure hydrocephalus, overreactive bladder are also affecting patient's functional outcome.   REHAB POTENTIAL: Good  CLINICAL DECISION MAKING: Evolving/moderate complexity  EVALUATION COMPLEXITY: Moderate  PLAN:  PT FREQUENCY: 1-2x/week  PT DURATION: 12 weeks  PLANNED INTERVENTIONS: 97164- PT Re-evaluation, 97750- Physical Performance Testing, 97110-Therapeutic exercises, 97530- Therapeutic activity, 97112- Neuromuscular re-education, 97535- Self Care, 02859- Manual therapy, [phone]- Gait  training, [phone]- Canalith repositioning, Patient/Family education, Balance training, Stair training, Joint mobilization, Vestibular training, Visual/preceptual remediation/compensation, Cognitive remediation, DME instructions, Cryotherapy, Moist heat, and Biofeedback  PLAN FOR NEXT SESSIONS:     *bed mobility training *tub transfer training  *gait training in small spaces where RW will not fit  - continue gait training and functional B LE strengthening in // bars  - continue use of 2lb AWs - continue lateral and forward/backwards stepping over PVC pipe  - wearing 2lb AWs as appropriate  - progress to lateral foot taps on step? - standing marches/foot taps/hip flexion for improved foot clearance  - continue use of AWs (2lbs on 7/28) - sit<>stands  - try weighted vest? - standing reaching overhead/upright for improved hip extension and upright posture - gait training    Isaias Dowson, PT, DPT, NCS, CSRS Physical Therapist - [place]  [facility]  7:59 PM 05/30/24

## 2024-06-04 ENCOUNTER — Ambulatory Visit

## 2024-06-04 ENCOUNTER — Ambulatory Visit: Admitting: Physical Therapy

## 2024-06-04 DIAGNOSIS — R2681 Unsteadiness on feet: Secondary | ICD-10-CM

## 2024-06-04 DIAGNOSIS — R278 Other lack of coordination: Secondary | ICD-10-CM

## 2024-06-04 DIAGNOSIS — R5381 Other malaise: Secondary | ICD-10-CM

## 2024-06-04 DIAGNOSIS — R269 Unspecified abnormalities of gait and mobility: Secondary | ICD-10-CM

## 2024-06-04 DIAGNOSIS — R262 Difficulty in walking, not elsewhere classified: Secondary | ICD-10-CM | POA: Diagnosis not present

## 2024-06-04 DIAGNOSIS — M6281 Muscle weakness (generalized): Secondary | ICD-10-CM

## 2024-06-04 NOTE — Therapy (Signed)
 OUTPATIENT PHYSICAL THERAPY NEURO TREATMENT   Patient Name: Kerri [name] MRN: [id number] DOB:Jan 10, 1931, 89 y.o., female Today's Date: 06/04/2024   PCP: Kerri Selinda Moose, PA-C  REFERRING PROVIDER: Cyrus Selinda Moose, PA-C   END OF SESSION:   PT End of Session - 06/04/24 1321     Visit Number 16    Number of Visits 48    Date for PT Re-Evaluation 08/22/24    Authorization Type [employer] [id number] for 24 PT vsts from 7/21-10/13 (8/25 is 8 of 24)    PT Start Time 1320    PT Stop Time 1400    PT Time Calculation (min) 40 min    Equipment Utilized During Treatment Gait belt    Activity Tolerance Patient tolerated treatment well;No increased pain    Behavior During Therapy WFL for tasks assessed/performed            Past Medical History:  Diagnosis Date   Arthritis    Chronic systolic heart failure (HCC)    CKD (chronic kidney disease), stage III (HCC)    Dementia (HCC)    Hypertension    Hypothyroidism    Overactive bladder    Past Surgical History:  Procedure Laterality Date   INTRAMEDULLARY (IM) NAIL INTERTROCHANTERIC Left 09/06/2022   Procedure: INTRAMEDULLARY (IM) NAIL INTERTROCHANTERIC;  Surgeon: Kerri Priest, MD;  Location: [facility] ORS;  Service: Orthopedics;  Laterality: Left;   PACEMAKER LEADLESS INSERTION N/A 01/07/2021   Procedure: PACEMAKER LEADLESS INSERTION;  Surgeon: Kerri Blunt, MD;  Location: [facility] IN[facility]ASIVE [facility];  Service: Cardiovascular;  Laterality: N/A;   PPM GENERATOR REMOVAL N/A 01/07/2021   Procedure: PPM GENERATOR REMOVAL;  Surgeon: Kerri Blunt, MD;  Location: [facility];  Service: Cardiovascular;  Laterality: N/A;   Patient Active Problem List   Diagnosis Date Noted   AKI (acute kidney injury) (HCC) 01/12/2024   Acute postoperative anemia due to expected blood loss 09/07/2022   Displaced intertrochanteric fracture of left femur, initial encounter for closed fracture (HCC) 09/05/2022   Orthostatic hypotension  07/12/2022   Diarrhea 07/10/2022   COVID-19 virus infection 07/08/2022   Electrolyte abnormality 07/08/2022   Malnutrition of moderate degree 07/08/2022   Acute delirium 07/08/2022   History of recurrent UTI (urinary tract infection) 01/30/2022   Constipation    UTI (urinary tract infection) 10/15/2021   Elevated brain natriuretic peptide (BNP) level 10/15/2021   Generalized weakness 10/15/2021   Chronic kidney disease, stage 3b (HCC) 10/15/2021   Normal pressure hydrocephalus (HCC) 10/15/2021   Dementia without behavioral disturbance (HCC) 10/15/2021   Hypothyroidism 10/15/2021   Essential hypertension 10/15/2021   Overactive bladder 10/15/2021   Moderate mitral regurgitation 01/13/2021   Status post placement of cardiac pacemaker 01/13/2021   CHB (complete heart block) (HCC) 01/07/2021   Chronic systolic CHF (congestive heart failure) (HCC) 12/17/2020   Postmenopausal osteoporosis 08/26/2016   Mixed Alzheimer's and vascular dementia (HCC) 06/13/2014    ONSET DATE: Progressive decline over ~1-1.5 years  REFERRING DIAG:  M51.369 (ICD-10-CM) - Other intervertebral disc degeneration, lumbar region without mention of lumbar back pain or lower extremity pain  R53.81 (ICD-10-CM) - Other malaise  M62.81 (ICD-10-CM) - Muscle weakness (generalized)    THERAPY DIAG:  Muscle weakness (generalized)  Other lack of coordination  Difficulty in walking, not elsewhere classified  Unsteadiness on feet  Debility  Abnormality of gait and mobility  Rationale for Evaluation and Treatment: Rehabilitation  SUBJECTIVE:  SUBJECTIVE STATEMENT:  Pt's caregiver, Kerri [name], reports patient went out on Saturday to get a pedicure, go to lunch, and a bakery. Pt's caregiver, Kerri [name], reports patient rested all day yesterday  after the big day on Saturday. Patient reports she is doing good.  No falls/stumbles and no medical updates reported.   From Prior sessions: Caregiver reports pt does have hx of having lumbar cortisone injections, which would improve patient's walking temporarily, but pt hasn't had one in >18months. Hx of L LE fx, but now reporting pain in R LE   From Eval: Patient previously known to this clinic. Patient with recent short hospitalization following AKI superimposed on CKI stage III with UTI. Pt received HHPT prior to returning to OPPT.  Kerri [name], caregiver, reports having taken patient to MD due to pt having decreased interest in doing things and then MD found out pt's kidney levels were low, resulting in her hospitalization.   Denies pain. Denies falls/stumbles at home.  Pt accompanied by: caregiver, Kerri [name]   PERTINENT HISTORY:  PMH: hypothyroidism, recurrent UTIs, osteoarthritis, prior anterior trochanter fracture, CKD stage IIIb, chronic systolic heart failure, dementia, hypertension, history of cardiac pacemaker, history of normal pressure hydrocephalus, overreactive bladder   Most recent A&P from Neurology note on 12/21/2023: Mixed dementia (Alzheimer's disease + vascular dementia) with significant ventriculomegaly (with negative lumbar drain trial in 2015) in a patient with history of sepsis secondary to UTI treated with trimethoprim  January 2023. Medication appears to help with UTI and improved mental status - reports good control with current medication Progressive behavioral disturbances with mood swings, outbursts, and apathy, exacerbated during the day. Seroquel (quetiapine) was initiated but caused significant lethargy. Alternative medications, lamotrigine and Depakote, were discussed for her mood-stabilizing properties and different tolerability profiles. - Discuss with primary care provider about reducing Seroquel dose to assess tolerability. Consider 1/2 tablet (12.5 mg) instead.  - If  unable to tolerate Seroquel at lower dose, will consider trial of Depakote Valproic Acid is an antiepileptic drug, used for many different conditions. Patient was advised not to stop medication abruptly, not to use alcohol. Acute side-effects can be abdominal pain, N/V, diarrhea and increased ammonia level. Long term side-effects are hair loss, weight gain, tremors and amenorrhea. Rarely, it can cause low platelets, liver failure and birth defects.  - Consider alternative medications such as lamotrigine or Depakote if Seroquel remains intolerable. - Document alternative medications in the chart for future reference.  Fatigue and somnolence Increased fatigue and somnolence potentially related to Seroquel, hypothyroidism, and medication side effects.  - Monitor heart rate and adjust medications if fatigue persists and heart rate remains low.  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 Symptoms include leg pain, particularly in the right leg, and dragging of the leg during ambulation. Physical therapy has been beneficial. Her symptoms arte chronic. She has had a reportedly negative lower extremity doppler, unavailable for my review.  Right leg pain with inability to pick up feet (right>left - likely from ventriculomegaly + peripheral neuropathy) patient with history of lumbar disc disease with stenosis - reports improvement during recent trial of prednisone. Suspicious for lumbar Disc Disease in addition to decondition Reviewed PCP note: Ongoing leg pain with intermittent weakness. On exam she does have some swelling around the right knee. Unsure if she could have some severe arthritis catching as she walks. Will obtain x-ray of the right knee today. Trial of prednisone 20 mg once a day for 5 days.  We did discuss that she has some degenerative lumbar disc disease with stenosis and likely playing a  role in right leg weakness. She has a pacemaker and we will defer MRI of the lumbar spine. Keep follow-up with neurology and if they feel it is necessary they could possibly pursue nerve conduction study. Inform caretaker to help patient and stay by her side when walking.   - Patient says that her legs feel heavy and that she has difficulty initiating the first step when walking - consistent with magnetic gait in patient with hydrocephalus and brain atrophy - Previous (-) Lumbar Puncture trial at [facility] (01/09/14). - Continue physical therapy to manage symptoms and improve mobility. - strict Emergency Room/return precautions    PAIN:  Are you having pain? No  PRECAUTIONS: Fall and ICD/Pacemaker  RED FLAGS: None and hx of spinal stenosis   WEIGHT BEARING RESTRICTIONS: No  FALLS: Has patient fallen in last 6 months? No  LIVING ENVIRONMENT: Lives with: caregiver 7 days a week. Alone for an hour or 2 a day, but only when alseep.   Lives in: House/apartment Stairs: Yes: Internal: 1 but ramp in place steps; none and External: 1 steps; none Has following equipment at home: Walker - 2 wheeled; transport chair; shower chair and grab bars   PLOF: Needs assistance with ADLs, Needs assistance with gait, Needs assistance with transfers, and Needs assistance with ambulation using RW - Primarily uses transport chair for mobility, but will ambulate with assistance to/from bathroom using RW in the home  PATIENT GOALS: Help her walking be easier and help her get stronger   OBJECTIVE:  Note: Objective measures were completed at Evaluation unless otherwise noted.  DIAGNOSTIC FINDINGS:   EXAM (from 10/28/2022): CT LUMBAR SPINE WITHOUT CONTRAST IMPRESSION: 1. Transitional anatomy. Adhering to convention from the prior lumbar spine MRI, the last fully formed disc space is labeled L5-S1. 2. Unchanged chronic compression fractures of L4 and L5. No new fracture of the lumbar spine. 3. Unchanged  lumbar spondylosis notable for moderate narrowing of the right lateral recess at L1-2 and L2-3, as well as mild spinal canal stenosis at L2-3 and L3-4.  Electronically Signed   By: Ryan Chess M.D.   On: 10/28/2022 16:42   EXAM (from 09/09/2022): CT HEAD WITHOUT CONTRAST  COMPARISON: CT head 09/05/2022, 08/15/2021   IMPRESSION: 1. No acute abnormality and no change from prior studies. 2. Atrophy and chronic microvascular ischemic change in the white matter.   Electronically Signed   By: Carlin Gaskins M.D.   On: 09/09/2022 11:50  COGNITION: Overall cognitive status: History of cognitive impairments - at baseline   SENSATION: Light touch: WFL on screen, but pt with hx of neuropathy in B LEs  COORDINATION: Not formally assessed, likely impacted by strength deficits as noted below  EDEMA:  Not formally assessed, but no significant edema noted  MUSCLE TONE: Not formally assessed   MUSCLE LENGTH: Not formally assessed   DTRs:  Not formally assessed   POSTURE: rounded shoulders, forward head, increased thoracic kyphosis, posterior pelvic tilt, and flexed trunk    LOWER EXTREMITY ROM:     Active  Right Eval Left Eval  Hip flexion Limited AROM by weakness Limited AROM by weakness (more impaired than R)  Hip extension Decreased ROM Decreased ROM  Hip abduction    Hip adduction    Hip internal rotation    Hip external rotation    Knee flexion [facility]  Knee extension [facility]  Ankle  dorsiflexion [facility] WFL  Ankle plantarflexion [facility] WFL  Ankle inversion    Ankle eversion     (Blank rows = not tested)  LOWER EXTREMITY MMT:    MMT Right Eval Left Eval  Hip flexion 3+ 3-  Hip extension    Hip abduction    Hip adduction    Hip internal rotation    Hip external rotation    Knee flexion 4- 4-  Knee extension 4- 4-  Ankle dorsiflexion 4- 3-  Ankle plantarflexion 3+ 4-  Ankle inversion    Ankle eversion    (Blank rows = not tested)  Manual Muscle Test  Scale 0/5 = No muscle contraction can be seen or felt 1/5 = Contraction can be felt, but there is no motion 2-/5 = Part moves through incomplete ROM w/ gravity decreased 2/5 = Part moves through complete ROM w/ gravity decreased 2+/5 = Part moves through incomplete ROM (<50%) against gravity or through complete ROM w/ gravity 3-/5 = Part moves through incomplete ROM (>50%) against gravity 3/5 = Part moves through complete ROM against gravity 3+/5 = Part moves through complete ROM against gravity/slight resistance 4-/5= Holds test position against slight to moderate pressure 4/5 = Part moves through complete ROM against gravity/moderate resistance 4+/5= Holds test position against moderate to strong pressure 5/5 = Part moves through complete ROM against gravity/full resistance  BED MOBILITY:  Findings: Sit to supine Min A for B LE management onto mat Supine to sit Min A for trunk upright with continued impaired motor plan Pt doesn't recall previously trained logroll technique to increase her independence, could be due to impaired carryover of technique in the clinic space Caregiver reports bed mobility has improved slightly at home  Pt with significant difficulty performing anterior scooting towards EOM requiring mod A for this   TRANSFERS: Sit to stand: Min A  Assistive device utilized: Environmental consultant - 2 wheeled     Stand to sit: Min A  Assistive device utilized: Environmental consultant - 2 wheeled     Chair to chair: Min A  Assistive device utilized: Environmental consultant - 2 wheeled      *continues to require cuing to turn fully prior to initiating sitting   RAMP:  Not tested  CURB:  Not tested  STAIRS: Not tested GAIT: Findings: Gait Characteristics: step to pattern, step through pattern, decreased step length- Right, decreased step length- Left, decreased stride length, decreased hip/knee flexion- Right, decreased hip/knee flexion- Left, decreased ankle dorsiflexion- Right, decreased ankle dorsiflexion- Left,  trunk flexed, narrow BOS, poor foot clearance- Right, and poor foot clearance- Left, Distance walked: ~82ft x2, Assistive device utilized:Walker - 2 wheeled, Level of assistance: Min A, and Comments: severely decreased gait speed  FUNCTIONAL TESTS:  5 times sit to stand: 83 seconds Timed up and go (TUG):  52min54 seconds using RW with min A 10 meter walk test: needs to be assessed  PATIENT SURVEYS:  PsFS: average: 3.67  Patient will completely turn 90degrees prior to sitting down during transfers with minimal cuing: 3  Patient will be able to maintain standing balance using B UE support on RW with only SBA while aide provides total A for LB clothing or ADLs: 5 Patient will stand with erect/upright posture when walking: 3  TREATMENT DATE: 06/04/2024   Pt arrives to therapy in her personal transport chair. Caregiver, Kerri [name], present.  Caregiver reports they have not yet attempted to go to the [facility] on Friday to perform the Nust[place].   R stand pivot transport chair>EOM using RW with min A for lifting to stand and light min A for balance while turning - cuing to step foot fully towards mat prior to sitting - pt does better at turning during 90degree transfers before initiating sitting.   Block practice sitting<>R sidelying<>supine for reverse logroll technique to increase independence with bed mobility and decrease caregiver burden - x3 reps Caregiver states at night, pt tends to sit down and have posterior trunk lean/LOB onto the bed before she has an opportunity to help patient with this technique Therapist provided reinforced education to patient during session on remaining sitting on EOB at night to perform a night time routine prior to lying down Therapist educated Caregiver on having a very simple task for pt to perform sitting EOB, prior to lying down, that requires  patient to reach forward towards an anterior target in sitting (ex: reaching to grab chapstick from her chair) - educated that this may help change patient's motor plan of immediately lying back once sitting on EOB Skilled heavy min A for trunk upright with coming to sitting and light min A for B LEs when going to lie down  Transported into bathroom in transport chair.   Set-up bathroom to simulate patient's home environment to participate in standing tub/shower transfer training. Pt using sink on her L side and grab bar underneath shower head for support because Caregiver reports pt's RW will not fit in her bathroom at home.  Performed 1x tub shower transfer with max A of 1 for balance and max/total A for lifting LEs in/out of the tub Entering tub towards R: requires 5x attempts and therapist providing max/total A to lift R LE to place it inside tub while in standing, but then pt immediately has to sit on tub transfer bench before bringing L LE into the tub Exiting tub towards L: comes to standing with B UE support on grab bar and then is able to lift L LE out of tub with max A, but then requires total A to lift R LE out of the tub as well  *Educated caregiver and patient on recommendation to perform seated tub transfer bench shower transfers at this time and Caregiver in agreement     PATIENT EDUCATION: Education details: Pt educated throughout session about proper posture and technique with exercises. Improved exercise technique, movement at target joints, use of target muscles after min to mod verbal, visual, tactile cues.  Person educated: Patient and Caregiver Kerri [name] Education method: Explanation Education comprehension: verbalized understanding and needs further education  HOME EXERCISE PROGRAM:  Access Code: [email or web address] URL: [email or web address] Date: 12/20/2023 Prepared by: Connell Kiss   Exercises - Sit to Stand with Armchair  - 1 x daily - 7 x weekly - 3 sets - 5  reps - Standing March with Counter Support  - 1 x daily - 7 x weekly - 2 sets - 10 reps - Supine Bridge  - 1 x daily - 7 x weekly - 2 sets - 10 reps - Supine Heel Slide with Strap  - 1 x daily - 7 x weekly - 2 sets - 10 reps - Supine Hip Abduction AROM  - 1 x daily - 7 x weekly - 3 sets - 10 reps - Seated  Long Arc Quad  - 1 x daily - 7 x weekly - 3 sets - 10 reps - Seated March  - 1 x daily - 7 x weekly - 3 sets - 10 reps - 2 hold - Seated Hip Abduction with Resistance  - 1 x daily - 7 x weekly - 2 sets - 10 reps   GOALS: Goals reviewed with patient? Yes  SHORT TERM GOALS: Target date: 07/11/2024   Patient will be independent in home exercise program to improve strength/mobility for better functional independence with ADLs.  Baseline: need to initiate 05/14/2024: Caregiver states engaging patient in this as much as possible Goal status: MET   LONG TERM GOALS: Target date: 08/22/2024  Patient will perform bed mobility with consistently no more than min A utilizing bed features/supports as needed and with no more than than min cuing from aide/caregiver.  Baseline: requires up to mod A and mod/max verbal cuing 05/14/2024: still requiring mod A and mod cuing with caregiver reporting pt having increased leg pain in the morning impacting progress with this 05/30/2024: still requiring mod A and mod cuing, pt continues to have R LE pain in the mornings impacting independence with this activity Goal status: IN PROGRESS  2.  Patient will increase PsFS score to equal to or greater than and average of 2 points to demonstrate statistically significant improvement in mobility and quality of life.  Baseline: 3.667 05/14/2024: 4.66 05/30/2024:  5.33 Goal status: IN PROGRESS  3.  Patient (> 69 years old) will complete five times sit to stand test in < 30 seconds indicating an increased LE strength and improved balance.  Baseline: 83 seconds from her transport chair with RW in front of her and min  progressed to light mod A  05/14/2024: 43.34 seconds from her transport chair with RW in front of her and min progressed to light mod A  05/30/2024: 36.93 seconds from transport chair to RW with min progressed to light mod A (on 2nd trial) Goal status: IN PROGRESS   4.  Patient will reduce timed up and go to <60 sec seconds to reduce fall risk and demonstrate improved transfer/gait ability.  Baseline: 80min54 seconds using RW with min A  05/14/2024: 80min57 seconds using RW with min A  05/30/2024:  2 min 2 seconds using RW with skilled min A for lifting to stand and balance/AD management during gait Goal status: IN PROGRESS  5.  Patient will increase 10 meter walk test by 0.39m/s as to improve gait speed for better household level ambulation and to reduce fall risk.  Baseline: 0.093m/s with RW  05/14/2024: 0.085 m/s using RW with min A 05/30/2024: 0.107 m/s using RW with skilled min A  Goal status: IN PROGRESS   ASSESSMENT:  CLINICAL IMPRESSION:    Therapy session focused on Caregiver's reported concerns at last therapy session of performing bed mobility training and tub/shower transfer training. Caregiver reports that at night when pt goes to get in the bed, patient immediately leans posteriorly onto the bed, making it really challenging for Caregiver to assist patient with lifting B LEs into the bed. Therapist problem solved with Caregiver and educated patient multiple times during session, in order to help with carryover at home, of sitting on EOB and staying there to perform seated night time routine prior to lying down. Therapist educated Caregiver on having patient perform a task that requires her to perform anterior trunk flexion/lean immediately upon sitting EOB to break up patient's prior motor plan and  allow a reset to cue patient through reverse logroll technique. Initiated tub transfer training with goal of performing standing transfer; however, at this time patient requires max/total A for  LE management in/out of the tub. Therapist educated Caregiver and patient on recommendation to perform seated tub transfers using tub transfer bench. Patient will benefit from additional tub transfer training and increased B LE strengthening to improve lateral stepping over obstacles. Ms. Mitter will benefit from further skilled PT to improve these deficits in order to increase QOL and ease/safety with ADLs. .     OBJECTIVE IMPAIRMENTS: Abnormal gait, decreased activity tolerance, decreased balance, decreased cognition, decreased coordination, decreased endurance, decreased knowledge of condition, decreased knowledge of use of DME, decreased mobility, difficulty walking, decreased ROM, decreased strength, impaired flexibility, impaired sensation, impaired UE functional use, and pain.   ACTIVITY LIMITATIONS: carrying, lifting, sitting, standing, transfers, bed mobility, continence, bathing, toileting, dressing, self feeding, reach over head, hygiene/grooming, and locomotion level  PARTICIPATION LIMITATIONS: meal prep, cleaning, laundry, shopping, and community activity  PERSONAL FACTORS: Age, Time since onset of injury/illness/exacerbation, and 3+ comorbidities: hypothyroidism, recurrent UTIs, osteoarthritis, prior anterior trochanter fracture, CKD stage IIIb, chronic systolic heart failure, dementia, hypertension, history of cardiac pacemaker, history of normal pressure hydrocephalus, overreactive bladder are also affecting patient's functional outcome.   REHAB POTENTIAL: Good  CLINICAL DECISION MAKING: Evolving/moderate complexity  EVALUATION COMPLEXITY: Moderate  PLAN:  PT FREQUENCY: 1-2x/week  PT DURATION: 12 weeks  PLANNED INTERVENTIONS: 97164- PT Re-evaluation, 97750- Physical Performance Testing, 97110-Therapeutic exercises, 97530- Therapeutic activity, 97112- Neuromuscular re-education, 97535- Self Care, [id number]- Manual therapy, [phone]- Gait training, [phone]- Canalith repositioning,  Patient/Family education, Balance training, Stair training, Joint mobilization, Vestibular training, Visual/preceptual remediation/compensation, Cognitive remediation, DME instructions, Cryotherapy, Moist heat, and Biofeedback  PLAN FOR NEXT SESSIONS:     *continue tub transfer training  *gait training in small spaces where RW will not fit  - continue gait training and functional B LE strengthening in // bars  - continue use of 2lb AWs - continue lateral and forward/backwards stepping over PVC pipe (progress height as able)  - wearing 2lb AWs as appropriate  - progress to lateral foot taps on step? - standing marches/foot taps/hip flexion for improved foot clearance  - continue use of AWs (2lbs on 7/28) - sit<>stands  - try weighted vest? - standing reaching overhead/upright for improved hip extension and upright posture - gait training    Timothee Gali, PT, DPT, NCS, CSRS Physical Therapist - [place]  [facility]  2:33 PM 06/04/24

## 2024-06-06 ENCOUNTER — Ambulatory Visit: Admitting: Physical Therapy

## 2024-06-06 DIAGNOSIS — R2681 Unsteadiness on feet: Secondary | ICD-10-CM

## 2024-06-06 DIAGNOSIS — R269 Unspecified abnormalities of gait and mobility: Secondary | ICD-10-CM

## 2024-06-06 DIAGNOSIS — R278 Other lack of coordination: Secondary | ICD-10-CM

## 2024-06-06 DIAGNOSIS — M6281 Muscle weakness (generalized): Secondary | ICD-10-CM

## 2024-06-06 DIAGNOSIS — R262 Difficulty in walking, not elsewhere classified: Secondary | ICD-10-CM | POA: Diagnosis not present

## 2024-06-06 DIAGNOSIS — R5381 Other malaise: Secondary | ICD-10-CM

## 2024-06-06 NOTE — Therapy (Signed)
 OUTPATIENT PHYSICAL THERAPY NEURO TREATMENT   Patient Name: Kerri [name] MRN: [id number] DOB:Sep 14, 1931, 89 y.o., female Today's Date: 06/06/2024   PCP: Cyrus Selinda Moose, PA-C  REFERRING PROVIDER: Cyrus Selinda Moose, PA-C   END OF SESSION:   PT End of Session - 06/06/24 1320     Visit Number 17    Number of Visits 48    Date for PT Re-Evaluation 08/22/24    Authorization Type [employer] [id number] for 24 PT vsts from 7/21-10/13 (8/27 is 9 of 24)    PT Start Time 1320    PT Stop Time 1402    PT Time Calculation (min) 42 min    Equipment Utilized During Treatment Gait belt    Activity Tolerance Patient tolerated treatment well;No increased pain    Behavior During Therapy WFL for tasks assessed/performed             Past Medical History:  Diagnosis Date   Arthritis    Chronic systolic heart failure (HCC)    CKD (chronic kidney disease), stage III (HCC)    Dementia (HCC)    Hypertension    Hypothyroidism    Overactive bladder    Past Surgical History:  Procedure Laterality Date   INTRAMEDULLARY (IM) NAIL INTERTROCHANTERIC Left 09/06/2022   Procedure: INTRAMEDULLARY (IM) NAIL INTERTROCHANTERIC;  Surgeon: Tobie Priest, MD;  Location: [facility];  Service: Orthopedics;  Laterality: Left;   PACEMAKER LEADLESS INSERTION N/A 01/07/2021   Procedure: PACEMAKER LEADLESS INSERTION;  Surgeon: Ammon Blunt, MD;  Location: [facility];  Service: Cardiovascular;  Laterality: N/A;   PPM GENERATOR REMOVAL N/A 01/07/2021   Procedure: PPM GENERATOR REMOVAL;  Surgeon: Ammon Blunt, MD;  Location: [facility];  Service: Cardiovascular;  Laterality: N/A;   Patient Active Problem List   Diagnosis Date Noted   AKI (acute kidney injury) (HCC) 01/12/2024   Acute postoperative anemia due to expected blood loss 09/07/2022   Displaced intertrochanteric fracture of left femur, initial encounter for closed fracture (HCC) 09/05/2022   Orthostatic  hypotension 07/12/2022   Diarrhea 07/10/2022   COVID-19 virus infection 07/08/2022   Electrolyte abnormality 07/08/2022   Malnutrition of moderate degree 07/08/2022   Acute delirium 07/08/2022   History of recurrent UTI (urinary tract infection) 01/30/2022   Constipation    UTI (urinary tract infection) 10/15/2021   Elevated brain natriuretic peptide (BNP) level 10/15/2021   Generalized weakness 10/15/2021   Chronic kidney disease, stage 3b (HCC) 10/15/2021   Normal pressure hydrocephalus (HCC) 10/15/2021   Dementia without behavioral disturbance (HCC) 10/15/2021   Hypothyroidism 10/15/2021   Essential hypertension 10/15/2021   Overactive bladder 10/15/2021   Moderate mitral regurgitation 01/13/2021   Status post placement of cardiac pacemaker 01/13/2021   CHB (complete heart block) (HCC) 01/07/2021   Chronic systolic CHF (congestive heart failure) (HCC) 12/17/2020   Postmenopausal osteoporosis 08/26/2016   Mixed Alzheimer's and vascular dementia (HCC) 06/13/2014    ONSET DATE: Progressive decline over ~1-1.5 years  REFERRING DIAG:  M51.369 (ICD-10-CM) - Other intervertebral disc degeneration, lumbar region without mention of lumbar back pain or lower extremity pain  R53.81 (ICD-10-CM) - Other malaise  M62.81 (ICD-10-CM) - Muscle weakness (generalized)    THERAPY DIAG:  Muscle weakness (generalized)  Other lack of coordination  Unsteadiness on feet  Debility  Abnormality of gait and mobility  Difficulty in walking, not elsewhere classified  Rationale for Evaluation and Treatment: Rehabilitation  SUBJECTIVE:  SUBJECTIVE STATEMENT:  Pt's caregiver, [name], reports getting in the bed was about the same since last therapy session. States she even tried having patient sit back up and  start the movement over to see if it would be better, with no improvement. States she had patient try to reach forward and fix her wheelchair cushion because patient usually likes for it to be lined up, indicating patient would be invested/interested in the task, in order to promote anterior trunk lean. [name] reports she feels it is because patient is tired by the end of the day, but reports she is going to keep trying.  Caregiver reports patient walked out to the garage using RW with her assistance yesterday. Patient reports she enjoys going out to the garage. Patient reports she is doing good.  No falls/stumbles and no medical updates reported.  From Prior sessions: Caregiver reports pt does have hx of having lumbar cortisone injections, which would improve patient's walking temporarily, but pt hasn't had one in >68months. Hx of L LE fx, but now reporting pain in R LE   From Eval: Patient previously known to this clinic. Patient with recent short hospitalization following AKI superimposed on CKI stage III with UTI. Pt received HHPT prior to returning to OPPT.  [name], caregiver, reports having taken patient to MD due to pt having decreased interest in doing things and then MD found out pt's kidney levels were low, resulting in her hospitalization.   Denies pain. Denies falls/stumbles at home.  Pt accompanied by: caregiver, [name]   PERTINENT HISTORY:  PMH: hypothyroidism, recurrent UTIs, osteoarthritis, prior anterior trochanter fracture, CKD stage IIIb, chronic systolic heart failure, dementia, hypertension, history of cardiac pacemaker, history of normal pressure hydrocephalus, overreactive bladder   Most recent A&P from Neurology note on 12/21/2023: Mixed dementia (Alzheimer's disease + vascular dementia) with significant ventriculomegaly (with negative lumbar drain trial in 2015) in a patient with history of sepsis secondary to UTI treated with trimethoprim  January 2023. Medication appears to help  with UTI and improved mental status - reports good control with current medication Progressive behavioral disturbances with mood swings, outbursts, and apathy, exacerbated during the day. Seroquel (quetiapine) was initiated but caused significant lethargy. Alternative medications, lamotrigine and Depakote, were discussed for her mood-stabilizing properties and different tolerability profiles. - Discuss with primary care provider about reducing Seroquel dose to assess tolerability. Consider 1/2 tablet (12.5 mg) instead.  - If unable to tolerate Seroquel at lower dose, will consider trial of Depakote Valproic Acid is an antiepileptic drug, used for many different conditions. Patient was advised not to stop medication abruptly, not to use alcohol. Acute side-effects can be abdominal pain, N/V, diarrhea and increased ammonia level. Long term side-effects are hair loss, weight gain, tremors and amenorrhea. Rarely, it can cause low platelets, liver failure and birth defects.  - Consider alternative medications such as lamotrigine or Depakote if Seroquel remains intolerable. - Document alternative medications in the chart for future reference.  Fatigue and somnolence Increased fatigue and somnolence potentially related to Seroquel, hypothyroidism, and medication side effects.  - Monitor heart rate and adjust medications if fatigue persists and heart rate remains low.  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 Symptoms include leg pain, particularly in the right leg, and dragging of the leg during ambulation. Physical therapy has been beneficial. Her symptoms arte chronic. She has had a reportedly negative lower extremity doppler, unavailable for my review.  Right leg pain  with inability to pick up feet (right>left - likely from ventriculomegaly + peripheral neuropathy) patient with history of lumbar  disc disease with stenosis - reports improvement during recent trial of prednisone. Suspicious for lumbar Disc Disease in addition to decondition Reviewed PCP note: Ongoing leg pain with intermittent weakness. On exam she does have some swelling around the right knee. Unsure if she could have some severe arthritis catching as she walks. Will obtain x-ray of the right knee today. Trial of prednisone 20 mg once a day for 5 days. We did discuss that she has some degenerative lumbar disc disease with stenosis and likely playing a role in right leg weakness. She has a pacemaker and we will defer MRI of the lumbar spine. Keep follow-up with neurology and if they feel it is necessary they could possibly pursue nerve conduction study. Inform caretaker to help patient and stay by her side when walking.   - Patient says that her legs feel heavy and that she has difficulty initiating the first step when walking - consistent with magnetic gait in patient with hydrocephalus and brain atrophy - Previous (-) Lumbar Puncture trial at [facility] (01/09/14). - Continue physical therapy to manage symptoms and improve mobility. - strict Emergency Room/return precautions    PAIN:  Are you having pain? No  PRECAUTIONS: Fall and ICD/Pacemaker  RED FLAGS: None and hx of spinal stenosis   WEIGHT BEARING RESTRICTIONS: No  FALLS: Has patient fallen in last 6 months? No  LIVING ENVIRONMENT: Lives with: caregiver 7 days a week. Alone for an hour or 2 a day, but only when alseep.   Lives in: House/apartment Stairs: Yes: Internal: 1 but ramp in place steps; none and External: 1 steps; none Has following equipment at home: Walker - 2 wheeled; transport chair; shower chair and grab bars   PLOF: Needs assistance with ADLs, Needs assistance with gait, Needs assistance with transfers, and Needs assistance with ambulation using RW - Primarily uses transport chair for mobility, but will ambulate with assistance to/from bathroom  using RW in the home  PATIENT GOALS: Help her walking be easier and help her get stronger   OBJECTIVE:  Note: Objective measures were completed at Evaluation unless otherwise noted.  DIAGNOSTIC FINDINGS:   EXAM (from 10/28/2022): CT LUMBAR SPINE WITHOUT CONTRAST IMPRESSION: 1. Transitional anatomy. Adhering to convention from the prior lumbar spine MRI, the last fully formed disc space is labeled L5-S1. 2. Unchanged chronic compression fractures of L4 and L5. No new fracture of the lumbar spine. 3. Unchanged lumbar spondylosis notable for moderate narrowing of the right lateral recess at L1-2 and L2-3, as well as mild spinal canal stenosis at L2-3 and L3-4.  Electronically Signed   By: Ryan Chess M.D.   On: 10/28/2022 16:42   EXAM (from 09/09/2022): CT HEAD WITHOUT CONTRAST  COMPARISON: CT head 09/05/2022, 08/15/2021   IMPRESSION: 1. No acute abnormality and no change from prior studies. 2. Atrophy and chronic microvascular ischemic change in the white matter.   Electronically Signed   By: Carlin Gaskins M.D.   On: 09/09/2022 11:50  COGNITION: Overall cognitive status: History of cognitive impairments - at baseline   SENSATION: Light touch: WFL on screen, but pt with hx of neuropathy in B LEs  COORDINATION: Not formally assessed, likely impacted by strength deficits as noted below  EDEMA:  Not formally assessed, but no significant edema noted  MUSCLE TONE: Not formally assessed   MUSCLE LENGTH: Not formally assessed   DTRs:  Not formally assessed   POSTURE: rounded shoulders, forward head, increased thoracic kyphosis, posterior pelvic tilt, and flexed trunk    LOWER EXTREMITY ROM:     Active  Right Eval Left Eval  Hip flexion Limited AROM by weakness Limited AROM by weakness (more impaired than R)  Hip extension Decreased ROM Decreased ROM  Hip abduction    Hip adduction    Hip internal rotation    Hip external rotation    Knee flexion [facility]  WFL  Knee extension [facility]  Ankle dorsiflexion [facility]  Ankle plantarflexion [facility] WFL  Ankle inversion    Ankle eversion     (Blank rows = not tested)  LOWER EXTREMITY MMT:    MMT Right Eval Left Eval  Hip flexion 3+ 3-  Hip extension    Hip abduction    Hip adduction    Hip internal rotation    Hip external rotation    Knee flexion 4- 4-  Knee extension 4- 4-  Ankle dorsiflexion 4- 3-  Ankle plantarflexion 3+ 4-  Ankle inversion    Ankle eversion    (Blank rows = not tested)  Manual Muscle Test Scale 0/5 = No muscle contraction can be seen or felt 1/5 = Contraction can be felt, but there is no motion 2-/5 = Part moves through incomplete ROM w/ gravity decreased 2/5 = Part moves through complete ROM w/ gravity decreased 2+/5 = Part moves through incomplete ROM (<50%) against gravity or through complete ROM w/ gravity 3-/5 = Part moves through incomplete ROM (>50%) against gravity 3/5 = Part moves through complete ROM against gravity 3+/5 = Part moves through complete ROM against gravity/slight resistance 4-/5= Holds test position against slight to moderate pressure 4/5 = Part moves through complete ROM against gravity/moderate resistance 4+/5= Holds test position against moderate to strong pressure 5/5 = Part moves through complete ROM against gravity/full resistance  BED MOBILITY:  Findings: Sit to supine Min A for B LE management onto mat Supine to sit Min A for trunk upright with continued impaired motor plan Pt doesn't recall previously trained logroll technique to increase her independence, could be due to impaired carryover of technique in the clinic space Caregiver reports bed mobility has improved slightly at home  Pt with significant difficulty performing anterior scooting towards EOM requiring mod A for this   TRANSFERS: Sit to stand: Min A  Assistive device utilized: Environmental consultant - 2 wheeled     Stand to sit: Min A  Assistive device utilized: Environmental consultant - 2  wheeled     Chair to chair: Min A  Assistive device utilized: Environmental consultant - 2 wheeled      *continues to require cuing to turn fully prior to initiating sitting   RAMP:  Not tested  CURB:  Not tested  STAIRS: Not tested GAIT: Findings: Gait Characteristics: step to pattern, step through pattern, decreased step length- Right, decreased step length- Left, decreased stride length, decreased hip/knee flexion- Right, decreased hip/knee flexion- Left, decreased ankle dorsiflexion- Right, decreased ankle dorsiflexion- Left, trunk flexed, narrow BOS, poor foot clearance- Right, and poor foot clearance- Left, Distance walked: ~35ft x2, Assistive device utilized:Walker - 2 wheeled, Level of assistance: Min A, and Comments: severely decreased gait speed  FUNCTIONAL TESTS:  5 times sit to stand: 83 seconds Timed up and go (TUG):  38min54 seconds using RW with min A 10 meter walk test: needs to be assessed  PATIENT SURVEYS:  PsFS: average: 3.67  Patient will completely turn 90degrees  prior to sitting down during transfers with minimal cuing: 3  Patient will be able to maintain standing balance using B UE support on RW with only SBA while aide provides total A for LB clothing or ADLs: 5 Patient will stand with erect/upright posture when walking: 3                                                                                                                              TREATMENT DATE: 06/06/2024   Pt arrives to therapy in her personal transport chair. Caregiver, [name], present.  Sit<>stands from transport chair<>RW with CGA/light min A today with pt demonstrating improving motor plan to bring feet back underneath BOS, anterior trunk lean/weight shift, and pushing up from armrest of seat.  Gait training ~67ft to bathroom using RW with skilled light min A and +2 providing w/c followt  Improving consistency of reciprocal stepping pattern with decreased freuency of R toe catching during swing advancement   Pt intermittently will push AD slightly too far forward Continues to have excessive trunk/hip/knee flexion during gait for overall partial crouched posture, but improving Continues to have overall decreased gait speed  Set-up bathroom again to simulate patient's home environment to participate in standing tub/shower transfer training. Pt using sink on her L side and grab bar underneath shower head for support because Caregiver reports pt's RW will not fit in her bathroom at home.  Performed 2x tub shower transfers (seated break between) with light mod A of 1 for balance today and max A for lifting LEs in/out of the tub Entering tub towards R: requires 3x attempts and therapist providing max A to lift R LE to place it inside tub while in standing, and pt able to remain standing in order to step L LE into the tub today! Patient also able to follow cuing to move her R foot over in order to allow space for her L foot Exiting tub towards L: comes to standing with B UE support on grab bar and then is able to lift L LE out of tub with mod A, and then is able to rotate hips in order to step R LE backwards out of the tub with min A due to lack of hip flexion strength, requiring her to compensate with knee flexion to clear her foot over the tub *Educated caregiver and patient on recommendation to perform seated tub transfer bench transfers at this time and Caregiver in agreement, confirming that is what they do   In  // bars performed the following with 2.5lb ankle weights on B LEs: Forward step-to progressed to reciprocal stepping over 1/2 foam rolls Down/back x3 laps Side stepping over 1/2 foam rolls Down/back x1 lap with this causing patient significant fatigue Requires mod progressed to max/total cuing to step her 1st foot over more in order to allow room for her following foot as she becomes fatigued *Pt has overall increased trunk/hip/knee flexed crouched posture during gait  in // bars at end of  session today due to fatigue    Overall patient demonstrates significant improvement in her functional mobility today compared to Monday, which Caregiver attributes to patient always showing improvement in her mobility in the days following therapy, but pt has minor decline over the weekend when not participating in therapy for ~5 days.   PATIENT EDUCATION: Education details: Pt educated throughout session about proper posture and technique with exercises. Improved exercise technique, movement at target joints, use of target muscles after min to mod verbal, visual, tactile cues.  Person educated: Patient and Caregiver [name] Education method: Explanation Education comprehension: verbalized understanding and needs further education  HOME EXERCISE PROGRAM:  Access Code: [email or web address] URL: [email or web address] Date: 12/20/2023 Prepared by: Connell Kiss   Exercises - Sit to Stand with Armchair  - 1 x daily - 7 x weekly - 3 sets - 5 reps - Standing March with Counter Support  - 1 x daily - 7 x weekly - 2 sets - 10 reps - Supine Bridge  - 1 x daily - 7 x weekly - 2 sets - 10 reps - Supine Heel Slide with Strap  - 1 x daily - 7 x weekly - 2 sets - 10 reps - Supine Hip Abduction AROM  - 1 x daily - 7 x weekly - 3 sets - 10 reps - Seated Long Arc Quad  - 1 x daily - 7 x weekly - 3 sets - 10 reps - Seated March  - 1 x daily - 7 x weekly - 3 sets - 10 reps - 2 hold - Seated Hip Abduction with Resistance  - 1 x daily - 7 x weekly - 2 sets - 10 reps   GOALS: Goals reviewed with patient? Yes  SHORT TERM GOALS: Target date: 07/11/2024   Patient will be independent in home exercise program to improve strength/mobility for better functional independence with ADLs.  Baseline: need to initiate 05/14/2024: Caregiver states engaging patient in this as much as possible Goal status: MET   LONG TERM GOALS: Target date: 08/22/2024  Patient will perform bed mobility with consistently no more  than min A utilizing bed features/supports as needed and with no more than than min cuing from aide/caregiver.  Baseline: requires up to mod A and mod/max verbal cuing 05/14/2024: still requiring mod A and mod cuing with caregiver reporting pt having increased leg pain in the morning impacting progress with this 05/30/2024: still requiring mod A and mod cuing, pt continues to have R LE pain in the mornings impacting independence with this activity Goal status: IN PROGRESS  2.  Patient will increase PsFS score to equal to or greater than and average of 2 points to demonstrate statistically significant improvement in mobility and quality of life.  Baseline: 3.667 05/14/2024: 4.66 05/30/2024:  5.33 Goal status: IN PROGRESS  3.  Patient (> 7 years old) will complete five times sit to stand test in < 30 seconds indicating an increased LE strength and improved balance.  Baseline: 83 seconds from her transport chair with RW in front of her and min progressed to light mod A  05/14/2024: 43.34 seconds from her transport chair with RW in front of her and min progressed to light mod A  05/30/2024: 36.93 seconds from transport chair to RW with min progressed to light mod A (on 2nd trial) Goal status: IN PROGRESS   4.  Patient will reduce timed up and go to <60 sec seconds to reduce fall  risk and demonstrate improved transfer/gait ability.  Baseline: 73min54 seconds using RW with min A  05/14/2024: 1min57 seconds using RW with min A  05/30/2024:  2 min 2 seconds using RW with skilled min A for lifting to stand and balance/AD management during gait Goal status: IN PROGRESS  5.  Patient will increase 10 meter walk test by 0.64m/s as to improve gait speed for better household level ambulation and to reduce fall risk.  Baseline: 0.055m/s with RW  05/14/2024: 0.085 m/s using RW with min A 05/30/2024: 0.107 m/s using RW with skilled min A  Goal status: IN PROGRESS   ASSESSMENT:  CLINICAL IMPRESSION:    Therapy  session continued to focus on Caregiver's reported concern of tub/shower transfer training. Patient able to participate in 2x standing tub shower transfers today with patient only requiring mod A and able to step both feet over the tub both times with mod A for L LE and max A for R LE. Patient demonstrates significant improvement in this task today compared to Monday allowing safe participation in practicing this transfer twice during the session. Patient continues to demonstrate greatest deficits in hip flexion to lift LEs over the tub while maintaining trunk/hip/knee extension on contralateral LE. Gait training during session using RW with patient demonstrating increasing consistency of reciprocal stepping pattern with improving R LE foot clearance. Remainder of therapy session continued to focus on weighted gait training navigating over obstacles both forwards and lateral to improve ability to clear feet during gait and during functional tasks such as shower transfers. Patient will benefit from additional tub transfer training and increased B LE strengthening to improve lateral stepping over obstacles. Ms. Soland will benefit from further skilled PT to improve these deficits in order to increase QOL and ease/safety with ADLs. .     OBJECTIVE IMPAIRMENTS: Abnormal gait, decreased activity tolerance, decreased balance, decreased cognition, decreased coordination, decreased endurance, decreased knowledge of condition, decreased knowledge of use of DME, decreased mobility, difficulty walking, decreased ROM, decreased strength, impaired flexibility, impaired sensation, impaired UE functional use, and pain.   ACTIVITY LIMITATIONS: carrying, lifting, sitting, standing, transfers, bed mobility, continence, bathing, toileting, dressing, self feeding, reach over head, hygiene/grooming, and locomotion level  PARTICIPATION LIMITATIONS: meal prep, cleaning, laundry, shopping, and community activity  PERSONAL  FACTORS: Age, Time since onset of injury/illness/exacerbation, and 3+ comorbidities: hypothyroidism, recurrent UTIs, osteoarthritis, prior anterior trochanter fracture, CKD stage IIIb, chronic systolic heart failure, dementia, hypertension, history of cardiac pacemaker, history of normal pressure hydrocephalus, overreactive bladder are also affecting patient's functional outcome.   REHAB POTENTIAL: Good  CLINICAL DECISION MAKING: Evolving/moderate complexity  EVALUATION COMPLEXITY: Moderate  PLAN:  PT FREQUENCY: 1-2x/week  PT DURATION: 12 weeks  PLANNED INTERVENTIONS: 97164- PT Re-evaluation, 97750- Physical Performance Testing, 97110-Therapeutic exercises, 97530- Therapeutic activity, 97112- Neuromuscular re-education, 97535- Self Care, [id number]- Manual therapy, [phone]- Gait training, [phone]- Canalith repositioning, Patient/Family education, Balance training, Stair training, Joint mobilization, Vestibular training, Visual/preceptual remediation/compensation, Cognitive remediation, DME instructions, Cryotherapy, Moist heat, and Biofeedback  PLAN FOR NEXT SESSIONS:     *continue tub transfer training  *gait training in small spaces where RW will not fit  - continue gait training and functional B LE strengthening in // bars  - continue use of 2.5lb AWs! - continue lateral and forward/backwards stepping over obstacles (progress height as able)  - wearing 2.5lb AWs as appropriate  - progress to lateral foot taps on step? - standing marches/foot taps/hip flexion for improved foot clearance  - continue use of  AWs (2lbs on 7/28) - sit<>stands  - try weighted vest? - standing reaching overhead/upright for improved hip extension and upright posture - gait training    Derin Matthes, PT, DPT, NCS, CSRS Physical Therapist - [place]  [facility]  2:04 PM 06/06/24

## 2024-06-13 ENCOUNTER — Telehealth: Payer: Self-pay | Admitting: Physical Therapy

## 2024-06-13 ENCOUNTER — Ambulatory Visit: Attending: Family Medicine | Admitting: Physical Therapy

## 2024-06-13 DIAGNOSIS — R269 Unspecified abnormalities of gait and mobility: Secondary | ICD-10-CM | POA: Insufficient documentation

## 2024-06-13 DIAGNOSIS — R2681 Unsteadiness on feet: Secondary | ICD-10-CM | POA: Insufficient documentation

## 2024-06-13 DIAGNOSIS — M6281 Muscle weakness (generalized): Secondary | ICD-10-CM | POA: Insufficient documentation

## 2024-06-13 DIAGNOSIS — R278 Other lack of coordination: Secondary | ICD-10-CM | POA: Insufficient documentation

## 2024-06-13 DIAGNOSIS — R5381 Other malaise: Secondary | ICD-10-CM | POA: Insufficient documentation

## 2024-06-13 DIAGNOSIS — R262 Difficulty in walking, not elsewhere classified: Secondary | ICD-10-CM | POA: Insufficient documentation

## 2024-06-13 NOTE — Therapy (Incomplete)
 OUTPATIENT PHYSICAL THERAPY NEURO TREATMENT   Patient Name: Kerri [name] MRN: [id number] DOB:20-Feb-1931, 89 y.o., female Today's Date: 06/13/2024   PCP: Cyrus Selinda Moose, PA-C  REFERRING PROVIDER: Cyrus Selinda Moose, PA-C   END OF SESSION: ***       Past Medical History:  Diagnosis Date   Arthritis    Chronic systolic heart failure (HCC)    CKD (chronic kidney disease), stage III (HCC)    Dementia (HCC)    Hypertension    Hypothyroidism    Overactive bladder    Past Surgical History:  Procedure Laterality Date   INTRAMEDULLARY (IM) NAIL INTERTROCHANTERIC Left 09/06/2022   Procedure: INTRAMEDULLARY (IM) NAIL INTERTROCHANTERIC;  Surgeon: Tobie Priest, MD;  Location: [facility] OR[facility];  Service: Orthopedics;  Laterality: Left;   PACEMAKER LEADLESS INSERTION N/A 01/07/2021   Procedure: PACEMAKER LEADLESS INSERTION;  Surgeon: Ammon Blunt, MD;  Location: [facility];  Service: Cardiovascular;  Laterality: N/A;   PPM GENERATOR REMOVAL N/A 01/07/2021   Procedure: PPM GENERATOR REMOVAL;  Surgeon: Ammon Blunt, MD;  Location: [facility];  Service: Cardiovascular;  Laterality: N/A;   Patient Active Problem List   Diagnosis Date Noted   AKI (acute kidney injury) (HCC) 01/12/2024   Acute postoperative anemia due to expected blood loss 09/07/2022   Displaced intertrochanteric fracture of left femur, initial encounter for closed fracture (HCC) 09/05/2022   Orthostatic hypotension 07/12/2022   Diarrhea 07/10/2022   COVID-19 virus infection 07/08/2022   Electrolyte abnormality 07/08/2022   Malnutrition of moderate degree 07/08/2022   Acute delirium 07/08/2022   History of recurrent UTI (urinary tract infection) 01/30/2022   Constipation    UTI (urinary tract infection) 10/15/2021   Elevated brain natriuretic peptide (BNP) level 10/15/2021   Generalized weakness 10/15/2021   Chronic kidney disease, stage 3b (HCC) 10/15/2021   Normal pressure  hydrocephalus (HCC) 10/15/2021   Dementia without behavioral disturbance (HCC) 10/15/2021   Hypothyroidism 10/15/2021   Essential hypertension 10/15/2021   Overactive bladder 10/15/2021   Moderate mitral regurgitation 01/13/2021   Status post placement of cardiac pacemaker 01/13/2021   CHB (complete heart block) (HCC) 01/07/2021   Chronic systolic CHF (congestive heart failure) (HCC) 12/17/2020   Postmenopausal osteoporosis 08/26/2016   Mixed Alzheimer's and vascular dementia (HCC) 06/13/2014    ONSET DATE: Progressive decline over ~1-1.5 years  REFERRING DIAG:  M51.369 (ICD-10-CM) - Other intervertebral disc degeneration, lumbar region without mention of lumbar back pain or lower extremity pain  R53.81 (ICD-10-CM) - Other malaise  M62.81 (ICD-10-CM) - Muscle weakness (generalized)    THERAPY DIAG: *** No diagnosis found.  Rationale for Evaluation and Treatment: Rehabilitation  SUBJECTIVE:  SUBJECTIVE STATEMENT:  ***able to set-up those additional 3 visits?   Pt's caregiver, [name], reports getting in the bed was about the same since last therapy session. States she even tried having patient sit back up and start the movement over to see if it would be better, with no improvement. States she had patient try to reach forward and fix her wheelchair cushion because patient usually likes for it to be lined up, indicating patient would be invested/interested in the task, in order to promote anterior trunk lean. [name] reports she feels it is because patient is tired by the end of the day, but reports she is going to keep trying.  Caregiver reports patient walked out to the garage using RW with her assistance yesterday. Patient reports she enjoys going out to the garage. Patient reports she is doing good.  No  falls/stumbles and no medical updates reported.  ***  From Prior sessions: Caregiver reports pt does have hx of having lumbar cortisone injections, which would improve patient's walking temporarily, but pt hasn't had one in >53months. Hx of L LE fx, but now reporting pain in R LE   From Eval: Patient previously known to this clinic. Patient with recent short hospitalization following AKI superimposed on CKI stage III with UTI. Pt received HHPT prior to returning to OPPT.  [name], caregiver, reports having taken patient to MD due to pt having decreased interest in doing things and then MD found out pt's kidney levels were low, resulting in her hospitalization.   Denies pain. Denies falls/stumbles at home.  Pt accompanied by: caregiver, [name]   PERTINENT HISTORY:  PMH: hypothyroidism, recurrent UTIs, osteoarthritis, prior anterior trochanter fracture, CKD stage IIIb, chronic systolic heart failure, dementia, hypertension, history of cardiac pacemaker, history of normal pressure hydrocephalus, overreactive bladder   Most recent A&P from Neurology note on 12/21/2023: Mixed dementia (Alzheimer's disease + vascular dementia) with significant ventriculomegaly (with negative lumbar drain trial in 2015) in a patient with history of sepsis secondary to UTI treated with trimethoprim  January 2023. Medication appears to help with UTI and improved mental status - reports good control with current medication Progressive behavioral disturbances with mood swings, outbursts, and apathy, exacerbated during the day. Seroquel (quetiapine) was initiated but caused significant lethargy. Alternative medications, lamotrigine and Depakote, were discussed for her mood-stabilizing properties and different tolerability profiles. - Discuss with primary care provider about reducing Seroquel dose to assess tolerability. Consider 1/2 tablet (12.5 mg) instead.  - If unable to tolerate Seroquel at lower dose, will consider trial of  Depakote Valproic Acid is an antiepileptic drug, used for many different conditions. Patient was advised not to stop medication abruptly, not to use alcohol. Acute side-effects can be abdominal pain, N/V, diarrhea and increased ammonia level. Long term side-effects are hair loss, weight gain, tremors and amenorrhea. Rarely, it can cause low platelets, liver failure and birth defects.  - Consider alternative medications such as lamotrigine or Depakote if Seroquel remains intolerable. - Document alternative medications in the chart for future reference.  Fatigue and somnolence Increased fatigue and somnolence potentially related to Seroquel, hypothyroidism, and medication side effects.  - Monitor heart rate and adjust medications if fatigue persists and heart rate remains low.  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 Symptoms include leg pain, particularly in the right leg, and dragging of the leg during ambulation. Physical therapy has been beneficial. Her symptoms arte chronic. She has had a reportedly negative  lower extremity doppler, unavailable for my review.  Right leg pain with inability to pick up feet (right>left - likely from ventriculomegaly + peripheral neuropathy) patient with history of lumbar disc disease with stenosis - reports improvement during recent trial of prednisone. Suspicious for lumbar Disc Disease in addition to decondition Reviewed PCP note: Ongoing leg pain with intermittent weakness. On exam she does have some swelling around the right knee. Unsure if she could have some severe arthritis catching as she walks. Will obtain x-ray of the right knee today. Trial of prednisone 20 mg once a day for 5 days. We did discuss that she has some degenerative lumbar disc disease with stenosis and likely playing a role in right leg weakness. She has a pacemaker and we will defer MRI  of the lumbar spine. Keep follow-up with neurology and if they feel it is necessary they could possibly pursue nerve conduction study. Inform caretaker to help patient and stay by her side when walking.   - Patient says that her legs feel heavy and that she has difficulty initiating the first step when walking - consistent with magnetic gait in patient with hydrocephalus and brain atrophy - Previous (-) Lumbar Puncture trial at [facility] (01/09/14). - Continue physical therapy to manage symptoms and improve mobility. - strict Emergency Room/return precautions    PAIN:  Are you having pain? No  PRECAUTIONS: Fall and ICD/Pacemaker  RED FLAGS: None and hx of spinal stenosis   WEIGHT BEARING RESTRICTIONS: No  FALLS: Has patient fallen in last 6 months? No  LIVING ENVIRONMENT: Lives with: caregiver 7 days a week. Alone for an hour or 2 a day, but only when alseep.   Lives in: House/apartment Stairs: Yes: Internal: 1 but ramp in place steps; none and External: 1 steps; none Has following equipment at home: Walker - 2 wheeled; transport chair; shower chair and grab bars   PLOF: Needs assistance with ADLs, Needs assistance with gait, Needs assistance with transfers, and Needs assistance with ambulation using RW - Primarily uses transport chair for mobility, but will ambulate with assistance to/from bathroom using RW in the home  PATIENT GOALS: Help her walking be easier and help her get stronger   OBJECTIVE:  Note: Objective measures were completed at Evaluation unless otherwise noted.  DIAGNOSTIC FINDINGS:   EXAM (from 10/28/2022): CT LUMBAR SPINE WITHOUT CONTRAST IMPRESSION: 1. Transitional anatomy. Adhering to convention from the prior lumbar spine MRI, the last fully formed disc space is labeled L5-S1. 2. Unchanged chronic compression fractures of L4 and L5. No new fracture of the lumbar spine. 3. Unchanged lumbar spondylosis notable for moderate narrowing of the right lateral  recess at L1-2 and L2-3, as well as mild spinal canal stenosis at L2-3 and L3-4.  Electronically Signed   By: Ryan Chess M.D.   On: 10/28/2022 16:42   EXAM (from 09/09/2022): CT HEAD WITHOUT CONTRAST  COMPARISON: CT head 09/05/2022, 08/15/2021   IMPRESSION: 1. No acute abnormality and no change from prior studies. 2. Atrophy and chronic microvascular ischemic change in the white matter.   Electronically Signed   By: Carlin Gaskins M.D.   On: 09/09/2022 11:50  COGNITION: Overall cognitive status: History of cognitive impairments - at baseline   SENSATION: Light touch: WFL on screen, but pt with hx of neuropathy in B LEs  COORDINATION: Not formally assessed, likely impacted by strength deficits as noted below  EDEMA:  Not formally assessed, but no significant edema noted  MUSCLE TONE: Not formally assessed  MUSCLE LENGTH: Not formally assessed   DTRs:  Not formally assessed   POSTURE: rounded shoulders, forward head, increased thoracic kyphosis, posterior pelvic tilt, and flexed trunk    LOWER EXTREMITY ROM:     Active  Right Eval Left Eval  Hip flexion Limited AROM by weakness Limited AROM by weakness (more impaired than R)  Hip extension Decreased ROM Decreased ROM  Hip abduction    Hip adduction    Hip internal rotation    Hip external rotation    Knee flexion [facility] WFL  Knee extension [facility]  Ankle dorsiflexion [facility] WFL  Ankle plantarflexion [facility] WFL  Ankle inversion    Ankle eversion     (Blank rows = not tested)  LOWER EXTREMITY MMT:    MMT Right Eval Left Eval  Hip flexion 3+ 3-  Hip extension    Hip abduction    Hip adduction    Hip internal rotation    Hip external rotation    Knee flexion 4- 4-  Knee extension 4- 4-  Ankle dorsiflexion 4- 3-  Ankle plantarflexion 3+ 4-  Ankle inversion    Ankle eversion    (Blank rows = not tested)  Manual Muscle Test Scale 0/5 = No muscle contraction can be seen or felt 1/5 = Contraction  can be felt, but there is no motion 2-/5 = Part moves through incomplete ROM w/ gravity decreased 2/5 = Part moves through complete ROM w/ gravity decreased 2+/5 = Part moves through incomplete ROM (<50%) against gravity or through complete ROM w/ gravity 3-/5 = Part moves through incomplete ROM (>50%) against gravity 3/5 = Part moves through complete ROM against gravity 3+/5 = Part moves through complete ROM against gravity/slight resistance 4-/5= Holds test position against slight to moderate pressure 4/5 = Part moves through complete ROM against gravity/moderate resistance 4+/5= Holds test position against moderate to strong pressure 5/5 = Part moves through complete ROM against gravity/full resistance  BED MOBILITY:  Findings: Sit to supine Min A for B LE management onto mat Supine to sit Min A for trunk upright with continued impaired motor plan Pt doesn't recall previously trained logroll technique to increase her independence, could be due to impaired carryover of technique in the clinic space Caregiver reports bed mobility has improved slightly at home  Pt with significant difficulty performing anterior scooting towards EOM requiring mod A for this   TRANSFERS: Sit to stand: Min A  Assistive device utilized: Environmental consultant - 2 wheeled     Stand to sit: Min A  Assistive device utilized: Environmental consultant - 2 wheeled     Chair to chair: Min A  Assistive device utilized: Environmental consultant - 2 wheeled      *continues to require cuing to turn fully prior to initiating sitting   RAMP:  Not tested  CURB:  Not tested  STAIRS: Not tested GAIT: Findings: Gait Characteristics: step to pattern, step through pattern, decreased step length- Right, decreased step length- Left, decreased stride length, decreased hip/knee flexion- Right, decreased hip/knee flexion- Left, decreased ankle dorsiflexion- Right, decreased ankle dorsiflexion- Left, trunk flexed, narrow BOS, poor foot clearance- Right, and poor foot  clearance- Left, Distance walked: ~86ft x2, Assistive device utilized:Walker - 2 wheeled, Level of assistance: Min A, and Comments: severely decreased gait speed  FUNCTIONAL TESTS:  5 times sit to stand: 83 seconds Timed up and go (TUG):  23min54 seconds using RW with min A 10 meter walk test: needs to be assessed  PATIENT SURVEYS:  PsFS: average: 3.67  Patient will completely turn 90degrees prior to sitting down during transfers with minimal cuing: 3  Patient will be able to maintain standing balance using B UE support on RW with only SBA while aide provides total A for LB clothing or ADLs: 5 Patient will stand with erect/upright posture when walking: 3                                                                                                                              TREATMENT DATE: 06/13/2024   ***  Pt arrives to therapy in her personal transport chair. Caregiver, [name], present.  Sit<>stands from transport chair<>RW with CGA/light min A today with pt demonstrating improving motor plan to bring feet back underneath BOS, anterior trunk lean/weight shift, and pushing up from armrest of seat.  Gait training ~50ft to bathroom using RW with skilled light min A and +2 providing w/c followt  Improving consistency of reciprocal stepping pattern with decreased freuency of R toe catching during swing advancement  Pt intermittently will push AD slightly too far forward Continues to have excessive trunk/hip/knee flexion during gait for overall partial crouched posture, but improving Continues to have overall decreased gait speed  Set-up bathroom again to simulate patient's home environment to participate in standing tub/shower transfer training. Pt using sink on her L side and grab bar underneath shower head for support because Caregiver reports pt's RW will not fit in her bathroom at home.  Performed 2x tub shower transfers (seated break between) with light mod A of 1 for balance today and  max A for lifting LEs in/out of the tub Entering tub towards R: requires 3x attempts and therapist providing max A to lift R LE to place it inside tub while in standing, and pt able to remain standing in order to step L LE into the tub today! Patient also able to follow cuing to move her R foot over in order to allow space for her L foot Exiting tub towards L: comes to standing with B UE support on grab bar and then is able to lift L LE out of tub with mod A, and then is able to rotate hips in order to step R LE backwards out of the tub with min A due to lack of hip flexion strength, requiring her to compensate with knee flexion to clear her foot over the tub *Educated caregiver and patient on recommendation to perform seated tub transfer bench transfers at this time and Caregiver in agreement, confirming that is what they do   In  // bars performed the following with 2.5lb ankle weights on B LEs: Forward step-to progressed to reciprocal stepping over 1/2 foam rolls Down/back x3 laps Side stepping over 1/2 foam rolls Down/back x1 lap with this causing patient significant fatigue Requires mod progressed to max/total cuing to step her 1st foot over more in order to allow room for her following foot as she becomes  fatigued *Pt has overall increased trunk/hip/knee flexed crouched posture during gait in // bars at end of session today due to fatigue    Overall patient demonstrates significant improvement in her functional mobility today compared to Monday, which Caregiver attributes to patient always showing improvement in her mobility in the days following therapy, but pt has minor decline over the weekend when not participating in therapy for ~5 days.  ***  PATIENT EDUCATION: Education details: Pt educated throughout session about proper posture and technique with exercises. Improved exercise technique, movement at target joints, use of target muscles after min to mod verbal, visual, tactile  cues.  Person educated: Patient and Caregiver [name] Education method: Explanation Education comprehension: verbalized understanding and needs further education  HOME EXERCISE PROGRAM:  Access Code: [email or web address] URL: [email or web address] Date: 12/20/2023 Prepared by: Connell Kiss   Exercises - Sit to Stand with Armchair  - 1 x daily - 7 x weekly - 3 sets - 5 reps - Standing March with Counter Support  - 1 x daily - 7 x weekly - 2 sets - 10 reps - Supine Bridge  - 1 x daily - 7 x weekly - 2 sets - 10 reps - Supine Heel Slide with Strap  - 1 x daily - 7 x weekly - 2 sets - 10 reps - Supine Hip Abduction AROM  - 1 x daily - 7 x weekly - 3 sets - 10 reps - Seated Long Arc Quad  - 1 x daily - 7 x weekly - 3 sets - 10 reps - Seated March  - 1 x daily - 7 x weekly - 3 sets - 10 reps - 2 hold - Seated Hip Abduction with Resistance  - 1 x daily - 7 x weekly - 2 sets - 10 reps   GOALS: Goals reviewed with patient? Yes  SHORT TERM GOALS: Target date: 07/11/2024   Patient will be independent in home exercise program to improve strength/mobility for better functional independence with ADLs.  Baseline: need to initiate 05/14/2024: Caregiver states engaging patient in this as much as possible Goal status: MET   LONG TERM GOALS: Target date: 08/22/2024  Patient will perform bed mobility with consistently no more than min A utilizing bed features/supports as needed and with no more than than min cuing from aide/caregiver.  Baseline: requires up to mod A and mod/max verbal cuing 05/14/2024: still requiring mod A and mod cuing with caregiver reporting pt having increased leg pain in the morning impacting progress with this 05/30/2024: still requiring mod A and mod cuing, pt continues to have R LE pain in the mornings impacting independence with this activity Goal status: IN PROGRESS  2.  Patient will increase PsFS score to equal to or greater than and average of 2 points to demonstrate  statistically significant improvement in mobility and quality of life.  Baseline: 3.667 05/14/2024: 4.66 05/30/2024:  5.33 Goal status: IN PROGRESS  3.  Patient (> 8 years old) will complete five times sit to stand test in < 30 seconds indicating an increased LE strength and improved balance.  Baseline: 83 seconds from her transport chair with RW in front of her and min progressed to light mod A  05/14/2024: 43.34 seconds from her transport chair with RW in front of her and min progressed to light mod A  05/30/2024: 36.93 seconds from transport chair to RW with min progressed to light mod A (on 2nd trial) Goal status: IN PROGRESS   4.  Patient will  reduce timed up and go to <60 sec seconds to reduce fall risk and demonstrate improved transfer/gait ability.  Baseline: 70min54 seconds using RW with min A  05/14/2024: 39min57 seconds using RW with min A  05/30/2024:  2 min 2 seconds using RW with skilled min A for lifting to stand and balance/AD management during gait Goal status: IN PROGRESS  5.  Patient will increase 10 meter walk test by 0.34m/s as to improve gait speed for better household level ambulation and to reduce fall risk.  Baseline: 0.048m/s with RW  05/14/2024: 0.085 m/s using RW with min A 05/30/2024: 0.107 m/s using RW with skilled min A  Goal status: IN PROGRESS   ASSESSMENT:  CLINICAL IMPRESSION:   *** Therapy session continued to focus on Caregiver's reported concern of tub/shower transfer training. Patient able to participate in 2x standing tub shower transfers today with patient only requiring mod A and able to step both feet over the tub both times with mod A for L LE and max A for R LE. Patient demonstrates significant improvement in this task today compared to Monday allowing safe participation in practicing this transfer twice during the session. Patient continues to demonstrate greatest deficits in hip flexion to lift LEs over the tub while maintaining trunk/hip/knee extension on  contralateral LE. Gait training during session using RW with patient demonstrating increasing consistency of reciprocal stepping pattern with improving R LE foot clearance. Remainder of therapy session continued to focus on weighted gait training navigating over obstacles both forwards and lateral to improve ability to clear feet during gait and during functional tasks such as shower transfers. Patient will benefit from additional tub transfer training and increased B LE strengthening to improve lateral stepping over obstacles. Ms. [place] will benefit from further skilled PT to improve these deficits in order to increase QOL and ease/safety with ADLs. .     OBJECTIVE IMPAIRMENTS: Abnormal gait, decreased activity tolerance, decreased balance, decreased cognition, decreased coordination, decreased endurance, decreased knowledge of condition, decreased knowledge of use of DME, decreased mobility, difficulty walking, decreased ROM, decreased strength, impaired flexibility, impaired sensation, impaired UE functional use, and pain.   ACTIVITY LIMITATIONS: carrying, lifting, sitting, standing, transfers, bed mobility, continence, bathing, toileting, dressing, self feeding, reach over head, hygiene/grooming, and locomotion level  PARTICIPATION LIMITATIONS: meal prep, cleaning, laundry, shopping, and community activity  PERSONAL FACTORS: Age, Time since onset of injury/illness/exacerbation, and 3+ comorbidities: hypothyroidism, recurrent UTIs, osteoarthritis, prior anterior trochanter fracture, CKD stage IIIb, chronic systolic heart failure, dementia, hypertension, history of cardiac pacemaker, history of normal pressure hydrocephalus, overreactive bladder are also affecting patient's functional outcome.   REHAB POTENTIAL: Good  CLINICAL DECISION MAKING: Evolving/moderate complexity  EVALUATION COMPLEXITY: Moderate  PLAN:  PT FREQUENCY: 1-2x/week  PT DURATION: 12 weeks  PLANNED INTERVENTIONS: [id number]-  PT Re-evaluation, 97750- Physical Performance Testing, 97110-Therapeutic exercises, 97530- Therapeutic activity, 97112- Neuromuscular re-education, 97535- Self Care, [id number]- Manual therapy, [phone]- Gait training, [phone]- Canalith repositioning, Patient/Family education, Balance training, Stair training, Joint mobilization, Vestibular training, Visual/preceptual remediation/compensation, Cognitive remediation, DME instructions, Cryotherapy, Moist heat, and Biofeedback  PLAN FOR NEXT SESSIONS:    ***  *continue tub transfer training  *gait training in small spaces where RW will not fit  - continue gait training and functional B LE strengthening in // bars  - continue use of 2.5lb AWs! - continue lateral and forward/backwards stepping over obstacles (progress height as able)  - wearing 2.5lb AWs as appropriate  - progress to lateral foot taps on step? -  standing marches/foot taps/hip flexion for improved foot clearance  - continue use of AWs (2lbs on 7/28) - sit<>stands  - try weighted vest? - standing reaching overhead/upright for improved hip extension and upright posture - gait training    Kayon Dozier, PT, DPT, NCS, CSRS Physical Therapist - [place]  [facility]  10:35 AM 06/13/24

## 2024-06-13 NOTE — Telephone Encounter (Signed)
 Pt contacted via telephone and author left voice mail informing of missed appointment and informed pt of future PT appointment date and time.   Casimiro Needle, PT, DPT, NCS, CSRS

## 2024-06-18 ENCOUNTER — Ambulatory Visit

## 2024-06-18 ENCOUNTER — Ambulatory Visit: Admitting: Physical Therapy

## 2024-06-18 DIAGNOSIS — R269 Unspecified abnormalities of gait and mobility: Secondary | ICD-10-CM

## 2024-06-18 DIAGNOSIS — R5381 Other malaise: Secondary | ICD-10-CM

## 2024-06-18 DIAGNOSIS — M6281 Muscle weakness (generalized): Secondary | ICD-10-CM | POA: Diagnosis present

## 2024-06-18 DIAGNOSIS — R2681 Unsteadiness on feet: Secondary | ICD-10-CM | POA: Diagnosis present

## 2024-06-18 DIAGNOSIS — R262 Difficulty in walking, not elsewhere classified: Secondary | ICD-10-CM | POA: Diagnosis present

## 2024-06-18 DIAGNOSIS — R278 Other lack of coordination: Secondary | ICD-10-CM | POA: Diagnosis present

## 2024-06-18 NOTE — Therapy (Signed)
 OUTPATIENT PHYSICAL THERAPY NEURO TREATMENT   Patient Name: Kerri [name] MRN: [id number] DOB:1931/02/16, 89 y.o., female Today's Date: 06/18/2024   PCP: Cyrus Selinda Moose, PA-C  REFERRING PROVIDER: Cyrus Selinda Moose, PA-C   END OF SESSION:   PT End of Session - 06/18/24 1316     Visit Number 18    Number of Visits 48    Date for PT Re-Evaluation 08/22/24    Authorization Type [employer] [id number] for 24 PT vsts from 7/21-10/13 (9/8 is 10 of 24)    PT Start Time 1315    PT Stop Time 1400    PT Time Calculation (min) 45 min    Equipment Utilized During Treatment Gait belt    Activity Tolerance Patient tolerated treatment well;No increased pain    Behavior During Therapy WFL for tasks assessed/performed              Past Medical History:  Diagnosis Date   Arthritis    Chronic systolic heart failure (HCC)    CKD (chronic kidney disease), stage III (HCC)    Dementia (HCC)    Hypertension    Hypothyroidism    Overactive bladder    Past Surgical History:  Procedure Laterality Date   INTRAMEDULLARY (IM) NAIL INTERTROCHANTERIC Left 09/06/2022   Procedure: INTRAMEDULLARY (IM) NAIL INTERTROCHANTERIC;  Surgeon: Tobie Priest, MD;  Location: [facility] OR[facility];  Service: Orthopedics;  Laterality: Left;   PACEMAKER LEADLESS INSERTION N/A 01/07/2021   Procedure: PACEMAKER LEADLESS INSERTION;  Surgeon: Ammon Blunt, MD;  Location: [facility];  Service: Cardiovascular;  Laterality: N/A;   PPM GENERATOR REMOVAL N/A 01/07/2021   Procedure: PPM GENERATOR REMOVAL;  Surgeon: Ammon Blunt, MD;  Location: [facility];  Service: Cardiovascular;  Laterality: N/A;   Patient Active Problem List   Diagnosis Date Noted   AKI (acute kidney injury) (HCC) 01/12/2024   Acute postoperative anemia due to expected blood loss 09/07/2022   Displaced intertrochanteric fracture of left femur, initial encounter for closed fracture (HCC) 09/05/2022   Orthostatic  hypotension 07/12/2022   Diarrhea 07/10/2022   COVID-19 virus infection 07/08/2022   Electrolyte abnormality 07/08/2022   Malnutrition of moderate degree 07/08/2022   Acute delirium 07/08/2022   History of recurrent UTI (urinary tract infection) 01/30/2022   Constipation    UTI (urinary tract infection) 10/15/2021   Elevated brain natriuretic peptide (BNP) level 10/15/2021   Generalized weakness 10/15/2021   Chronic kidney disease, stage 3b (HCC) 10/15/2021   Normal pressure hydrocephalus (HCC) 10/15/2021   Dementia without behavioral disturbance (HCC) 10/15/2021   Hypothyroidism 10/15/2021   Essential hypertension 10/15/2021   Overactive bladder 10/15/2021   Moderate mitral regurgitation 01/13/2021   Status post placement of cardiac pacemaker 01/13/2021   CHB (complete heart block) (HCC) 01/07/2021   Chronic systolic CHF (congestive heart failure) (HCC) 12/17/2020   Postmenopausal osteoporosis 08/26/2016   Mixed Alzheimer's and vascular dementia (HCC) 06/13/2014    ONSET DATE: Progressive decline over ~1-1.5 years  REFERRING DIAG:  M51.369 (ICD-10-CM) - Other intervertebral disc degeneration, lumbar region without mention of lumbar back pain or lower extremity pain  R53.81 (ICD-10-CM) - Other malaise  M62.81 (ICD-10-CM) - Muscle weakness (generalized)    THERAPY DIAG:  Muscle weakness (generalized)  Other lack of coordination  Unsteadiness on feet  Debility  Abnormality of gait and mobility  Difficulty in walking, not elsewhere classified  Rationale for Evaluation and Treatment: Rehabilitation  SUBJECTIVE:  SUBJECTIVE STATEMENT:  Pt's caregiver, [name], apologizes for missing last visit due to her having to put her dog down. Pt reports she is doing good and is ready to do therapy.  Pt's caregiver reports pt started a new medication for mood stabilization and it has helped (lamotrigine). Pt reports she is still walking out to sit in her garage with her Caregiver's assistance. No falls/stumbles.    From Prior sessions: Caregiver reports pt does have hx of having lumbar cortisone injections, which would improve patient's walking temporarily, but pt hasn't had one in >72months. Hx of L LE fx, but now reporting pain in R LE   From Eval: Patient previously known to this clinic. Patient with recent short hospitalization following AKI superimposed on CKI stage III with UTI. Pt received HHPT prior to returning to OPPT.  [name], caregiver, reports having taken patient to MD due to pt having decreased interest in doing things and then MD found out pt's kidney levels were low, resulting in her hospitalization.   Denies pain. Denies falls/stumbles at home.  Pt accompanied by: caregiver, [name]   PERTINENT HISTORY:  PMH: hypothyroidism, recurrent UTIs, osteoarthritis, prior anterior trochanter fracture, CKD stage IIIb, chronic systolic heart failure, dementia, hypertension, history of cardiac pacemaker, history of normal pressure hydrocephalus, overreactive bladder   Most recent A&P from Neurology note on 12/21/2023: Mixed dementia (Alzheimer's disease + vascular dementia) with significant ventriculomegaly (with negative lumbar drain trial in 2015) in a patient with history of sepsis secondary to UTI treated with trimethoprim  January 2023. Medication appears to help with UTI and improved mental status - reports good control with current medication Progressive behavioral disturbances with mood swings, outbursts, and apathy, exacerbated during the day. Seroquel (quetiapine) was initiated but caused significant lethargy. Alternative medications, lamotrigine and Depakote, were discussed for her mood-stabilizing properties and different tolerability profiles. - Discuss with primary care provider  about reducing Seroquel dose to assess tolerability. Consider 1/2 tablet (12.5 mg) instead.  - If unable to tolerate Seroquel at lower dose, will consider trial of Depakote Valproic Acid is an antiepileptic drug, used for many different conditions. Patient was advised not to stop medication abruptly, not to use alcohol. Acute side-effects can be abdominal pain, N/V, diarrhea and increased ammonia level. Long term side-effects are hair loss, weight gain, tremors and amenorrhea. Rarely, it can cause low platelets, liver failure and birth defects.  - Consider alternative medications such as lamotrigine or Depakote if Seroquel remains intolerable. - Document alternative medications in the chart for future reference.  Fatigue and somnolence Increased fatigue and somnolence potentially related to Seroquel, hypothyroidism, and medication side effects.  - Monitor heart rate and adjust medications if fatigue persists and heart rate remains low.  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 Symptoms include leg pain, particularly in the right leg, and dragging of the leg during ambulation. Physical therapy has been beneficial. Her symptoms arte chronic. She has had a reportedly negative lower extremity doppler, unavailable for my review.  Right leg pain with inability to pick up feet (right>left - likely from ventriculomegaly + peripheral neuropathy) patient with history of lumbar disc disease with stenosis - reports improvement during recent trial of prednisone. Suspicious for lumbar Disc Disease in addition to decondition Reviewed PCP note: Ongoing leg pain with intermittent weakness. On exam she does have some swelling around the right knee. Unsure if she could have some severe arthritis catching as she walks. Will obtain x-ray  of the right knee today. Trial of prednisone 20 mg once a day for 5 days. We  did discuss that she has some degenerative lumbar disc disease with stenosis and likely playing a role in right leg weakness. She has a pacemaker and we will defer MRI of the lumbar spine. Keep follow-up with neurology and if they feel it is necessary they could possibly pursue nerve conduction study. Inform caretaker to help patient and stay by her side when walking.   - Patient says that her legs feel heavy and that she has difficulty initiating the first step when walking - consistent with magnetic gait in patient with hydrocephalus and brain atrophy - Previous (-) Lumbar Puncture trial at [facility] (01/09/14). - Continue physical therapy to manage symptoms and improve mobility. - strict Emergency Room/return precautions    PAIN:  Are you having pain? No  PRECAUTIONS: Fall and ICD/Pacemaker  RED FLAGS: None and hx of spinal stenosis   WEIGHT BEARING RESTRICTIONS: No  FALLS: Has patient fallen in last 6 months? No  LIVING ENVIRONMENT: Lives with: caregiver 7 days a week. Alone for an hour or 2 a day, but only when alseep.   Lives in: House/apartment Stairs: Yes: Internal: 1 but ramp in place steps; none and External: 1 steps; none Has following equipment at home: Walker - 2 wheeled; transport chair; shower chair and grab bars   PLOF: Needs assistance with ADLs, Needs assistance with gait, Needs assistance with transfers, and Needs assistance with ambulation using RW - Primarily uses transport chair for mobility, but will ambulate with assistance to/from bathroom using RW in the home  PATIENT GOALS: Help her walking be easier and help her get stronger   OBJECTIVE:  Note: Objective measures were completed at Evaluation unless otherwise noted.  DIAGNOSTIC FINDINGS:   EXAM (from 10/28/2022): CT LUMBAR SPINE WITHOUT CONTRAST IMPRESSION: 1. Transitional anatomy. Adhering to convention from the prior lumbar spine MRI, the last fully formed disc space is labeled L5-S1. 2. Unchanged  chronic compression fractures of L4 and L5. No new fracture of the lumbar spine. 3. Unchanged lumbar spondylosis notable for moderate narrowing of the right lateral recess at L1-2 and L2-3, as well as mild spinal canal stenosis at L2-3 and L3-4.  Electronically Signed   By: Ryan Chess M.D.   On: 10/28/2022 16:42   EXAM (from 09/09/2022): CT HEAD WITHOUT CONTRAST  COMPARISON: CT head 09/05/2022, 08/15/2021   IMPRESSION: 1. No acute abnormality and no change from prior studies. 2. Atrophy and chronic microvascular ischemic change in the white matter.   Electronically Signed   By: Carlin Gaskins M.D.   On: 09/09/2022 11:50  COGNITION: Overall cognitive status: History of cognitive impairments - at baseline   SENSATION: Light touch: WFL on screen, but pt with hx of neuropathy in B LEs  COORDINATION: Not formally assessed, likely impacted by strength deficits as noted below  EDEMA:  Not formally assessed, but no significant edema noted  MUSCLE TONE: Not formally assessed   MUSCLE LENGTH: Not formally assessed   DTRs:  Not formally assessed   POSTURE: rounded shoulders, forward head, increased thoracic kyphosis, posterior pelvic tilt, and flexed trunk    LOWER EXTREMITY ROM:     Active  Right Eval Left Eval  Hip flexion Limited AROM by weakness Limited AROM by weakness (more impaired than R)  Hip extension Decreased ROM Decreased ROM  Hip abduction    Hip adduction    Hip internal rotation    Hip  external rotation    Knee flexion [facility] WFL  Knee extension [facility]  Ankle dorsiflexion [facility]  Ankle plantarflexion [facility] WFL  Ankle inversion    Ankle eversion     (Blank rows = not tested)  LOWER EXTREMITY MMT:    MMT Right Eval Left Eval  Hip flexion 3+ 3-  Hip extension    Hip abduction    Hip adduction    Hip internal rotation    Hip external rotation    Knee flexion 4- 4-  Knee extension 4- 4-  Ankle dorsiflexion 4- 3-  Ankle plantarflexion 3+  4-  Ankle inversion    Ankle eversion    (Blank rows = not tested)  Manual Muscle Test Scale 0/5 = No muscle contraction can be seen or felt 1/5 = Contraction can be felt, but there is no motion 2-/5 = Part moves through incomplete ROM w/ gravity decreased 2/5 = Part moves through complete ROM w/ gravity decreased 2+/5 = Part moves through incomplete ROM (<50%) against gravity or through complete ROM w/ gravity 3-/5 = Part moves through incomplete ROM (>50%) against gravity 3/5 = Part moves through complete ROM against gravity 3+/5 = Part moves through complete ROM against gravity/slight resistance 4-/5= Holds test position against slight to moderate pressure 4/5 = Part moves through complete ROM against gravity/moderate resistance 4+/5= Holds test position against moderate to strong pressure 5/5 = Part moves through complete ROM against gravity/full resistance  BED MOBILITY:  Findings: Sit to supine Min A for B LE management onto mat Supine to sit Min A for trunk upright with continued impaired motor plan Pt doesn't recall previously trained logroll technique to increase her independence, could be due to impaired carryover of technique in the clinic space Caregiver reports bed mobility has improved slightly at home  Pt with significant difficulty performing anterior scooting towards EOM requiring mod A for this   TRANSFERS: Sit to stand: Min A  Assistive device utilized: Environmental consultant - 2 wheeled     Stand to sit: Min A  Assistive device utilized: Environmental consultant - 2 wheeled     Chair to chair: Min A  Assistive device utilized: Environmental consultant - 2 wheeled      *continues to require cuing to turn fully prior to initiating sitting   RAMP:  Not tested  CURB:  Not tested  STAIRS: Not tested GAIT: Findings: Gait Characteristics: step to pattern, step through pattern, decreased step length- Right, decreased step length- Left, decreased stride length, decreased hip/knee flexion- Right, decreased  hip/knee flexion- Left, decreased ankle dorsiflexion- Right, decreased ankle dorsiflexion- Left, trunk flexed, narrow BOS, poor foot clearance- Right, and poor foot clearance- Left, Distance walked: ~58ft x2, Assistive device utilized:Walker - 2 wheeled, Level of assistance: Min A, and Comments: severely decreased gait speed  FUNCTIONAL TESTS:  5 times sit to stand: 83 seconds Timed up and go (TUG):  53min54 seconds using RW with min A 10 meter walk test: needs to be assessed  PATIENT SURVEYS:  PsFS: average: 3.67  Patient will completely turn 90degrees prior to sitting down during transfers with minimal cuing: 3  Patient will be able to maintain standing balance using B UE support on RW with only SBA while aide provides total A for LB clothing or ADLs: 5 Patient will stand with erect/upright posture when walking: 3  TREATMENT DATE: 06/18/2024   Pt arrives to therapy in her personal transport chair. Caregiver, [name], present.  Sit<>stands from transport chair<>RW with CGA/light min A today with pt demonstrating improving motor plan to bring feet back underneath BOS, anterior trunk lean/weight shift, and pushing up from armrest of seat.  Gait training ~21ft to bathroom using RW with skilled light min A/CGA of 1 and +2 providing w/c follow to allow increased gait distance Improving consistency of reciprocal stepping pattern with decreased frequency of R toe catching during swing advancement until she becomes fatigued Pt intermittently will push AD slightly too far forward, but easily corrects with min cuing Demos improved trunk/hip/knee extension for improved upright posture during gait today rather than typical crouched posture Continues to have overall decreased gait speed  Set-up bathroom again to simulate patient's home environment to participate in standing tub/shower  transfer training. Pt using sink on her L side and grab bar underneath shower head for support because Caregiver reports pt's RW will not fit in her bathroom at home.  Performed 2x tub shower transfers (seated break between) with light mod/heavy min A of 1 for balance today and only mod A for lifting LEs in/out of the tub Entering tub towards R: requires 3x attempts and therapist providing mod/max A to lift R LE to place it inside tub while in standing, and pt again able to remain standing in order to step L LE into the tub today! Patient also able to follow cuing to move her R foot over in order to allow space for her L foot Provided pt 3x attempts to advance her foot over the tub prior to providing necessary assistance Exiting tub towards L: comes to standing with B UE support on grab bar and then is able to lift L LE out of tub with mod A, and then is able to rotate hips in order to step R LE backwards out of the tub with min A due to lack of hip flexion strength, requiring her to compensate with knee flexion to clear her foot over the tub; but improved independence with this *Educated caregiver and patient on continued recommendation to perform seated tub transfer bench transfers at this time and Caregiver in agreement, confirming that is what they are doing   In  // bars performed the following with 2.5lb ankle weights on B LEs: Forward reciprocal stepping over 4x 1/2 foam rolls Down/back x2 laps Side stepping over 4x 1/2 foam rolls  Down/back 1 lap + 2 laps (seated break between) with this causing patient significant fatigue Requires max/total cuing progressed to mod cuing to perform 2 steps to get both feet over each obstacle Pt does better sequencing steps after visual demonstration and education on the proper sequence More difficulty stepping towards L compared to stepping towards R due to difficulty clearing R foot when bringing it in *Pt has progressively increased trunk/hip/knee flexed  crouched posture during gait in // bars at end of session today due to fatigue, but not as significant as during last session   PATIENT EDUCATION: Education details: Pt educated throughout session about proper posture and technique with exercises. Improved exercise technique, movement at target joints, use of target muscles after min to mod verbal, visual, tactile cues.  Person educated: Patient and Caregiver [name] Education method: Explanation Education comprehension: verbalized understanding and needs further education  HOME EXERCISE PROGRAM:  Access Code: [email or web address] URL: [email or web address] Date: 12/20/2023 Prepared by: Connell Kiss   Exercises - Sit to Stand with Armchair  -  1 x daily - 7 x weekly - 3 sets - 5 reps - Standing March with Counter Support  - 1 x daily - 7 x weekly - 2 sets - 10 reps - Supine Bridge  - 1 x daily - 7 x weekly - 2 sets - 10 reps - Supine Heel Slide with Strap  - 1 x daily - 7 x weekly - 2 sets - 10 reps - Supine Hip Abduction AROM  - 1 x daily - 7 x weekly - 3 sets - 10 reps - Seated Long Arc Quad  - 1 x daily - 7 x weekly - 3 sets - 10 reps - Seated March  - 1 x daily - 7 x weekly - 3 sets - 10 reps - 2 hold - Seated Hip Abduction with Resistance  - 1 x daily - 7 x weekly - 2 sets - 10 reps   GOALS: Goals reviewed with patient? Yes  SHORT TERM GOALS: Target date: 07/11/2024   Patient will be independent in home exercise program to improve strength/mobility for better functional independence with ADLs.  Baseline: need to initiate 05/14/2024: Caregiver states engaging patient in this as much as possible Goal status: MET   LONG TERM GOALS: Target date: 08/22/2024  Patient will perform bed mobility with consistently no more than min A utilizing bed features/supports as needed and with no more than than min cuing from aide/caregiver.  Baseline: requires up to mod A and mod/max verbal cuing 05/14/2024: still requiring mod A and mod cuing  with caregiver reporting pt having increased leg pain in the morning impacting progress with this 05/30/2024: still requiring mod A and mod cuing, pt continues to have R LE pain in the mornings impacting independence with this activity Goal status: IN PROGRESS  2.  Patient will increase PsFS score to equal to or greater than and average of 2 points to demonstrate statistically significant improvement in mobility and quality of life.  Baseline: 3.667 05/14/2024: 4.66 05/30/2024:  5.33 Goal status: IN PROGRESS  3.  Patient (> 23 years old) will complete five times sit to stand test in < 30 seconds indicating an increased LE strength and improved balance.  Baseline: 83 seconds from her transport chair with RW in front of her and min progressed to light mod A  05/14/2024: 43.34 seconds from her transport chair with RW in front of her and min progressed to light mod A  05/30/2024: 36.93 seconds from transport chair to RW with min progressed to light mod A (on 2nd trial) Goal status: IN PROGRESS   4.  Patient will reduce timed up and go to <60 sec seconds to reduce fall risk and demonstrate improved transfer/gait ability.  Baseline: 61min54 seconds using RW with min A  05/14/2024: 37min57 seconds using RW with min A  05/30/2024:  2 min 2 seconds using RW with skilled min A for lifting to stand and balance/AD management during gait Goal status: IN PROGRESS  5.  Patient will increase 10 meter walk test by 0.65m/s as to improve gait speed for better household level ambulation and to reduce fall risk.  Baseline: 0.071m/s with RW  05/14/2024: 0.085 m/s using RW with min A 05/30/2024: 0.107 m/s using RW with skilled min A  Goal status: IN PROGRESS   ASSESSMENT:  CLINICAL IMPRESSION:    Therapy session continued to focus on Caregiver's reported concern of tub/shower transfer training. Patient able to participate in 2x standing tub shower transfers today with patient only  requiring light mod A and able to step  both feet over the tub both times with only mod A for each LE. Patient continues to demonstrate greatest deficits in hip flexion to lift LEs over the tub while maintaining trunk/hip/knee extension on contralateral LE for upright posture. Gait training during session using RW with patient demonstrating increasing consistency of reciprocal stepping pattern with improving R LE foot clearance and overall significant improvement in trunk/hip/knee extension to maintain upright posture while also achieving a longer gait distance! Remainder of therapy session continued to focus on weighted gait training navigating over obstacles both forwards and lateral to improve ability to clear feet during gait and during functional tasks such as shower transfers. Patient will benefit from additional tub transfer training and increased B LE strengthening to improve lateral stepping over obstacles. Ms. Liberto will benefit from further skilled PT to improve these deficits in order to increase QOL and ease/safety with ADLs. .     OBJECTIVE IMPAIRMENTS: Abnormal gait, decreased activity tolerance, decreased balance, decreased cognition, decreased coordination, decreased endurance, decreased knowledge of condition, decreased knowledge of use of DME, decreased mobility, difficulty walking, decreased ROM, decreased strength, impaired flexibility, impaired sensation, impaired UE functional use, and pain.   ACTIVITY LIMITATIONS: carrying, lifting, sitting, standing, transfers, bed mobility, continence, bathing, toileting, dressing, self feeding, reach over head, hygiene/grooming, and locomotion level  PARTICIPATION LIMITATIONS: meal prep, cleaning, laundry, shopping, and community activity  PERSONAL FACTORS: Age, Time since onset of injury/illness/exacerbation, and 3+ comorbidities: hypothyroidism, recurrent UTIs, osteoarthritis, prior anterior trochanter fracture, CKD stage IIIb, chronic systolic heart failure, dementia,  hypertension, history of cardiac pacemaker, history of normal pressure hydrocephalus, overreactive bladder are also affecting patient's functional outcome.   REHAB POTENTIAL: Good  CLINICAL DECISION MAKING: Evolving/moderate complexity  EVALUATION COMPLEXITY: Moderate  PLAN:  PT FREQUENCY: 1-2x/week  PT DURATION: 12 weeks  PLANNED INTERVENTIONS: 97164- PT Re-evaluation, 97750- Physical Performance Testing, 97110-Therapeutic exercises, 97530- Therapeutic activity, 97112- Neuromuscular re-education, 97535- Self Care, [id number]- Manual therapy, [id number]- Gait training, [id number]- Canalith repositioning, Patient/Family education, Balance training, Stair training, Joint mobilization, Vestibular training, Visual/preceptual remediation/compensation, Cognitive remediation, DME instructions, Cryotherapy, Moist heat, and Biofeedback  PLAN FOR NEXT SESSIONS:     *continue tub transfer training 1 additional session *gait training in small spaces where RW will not fit  - continue gait training and functional B LE strengthening in // bars  - progress to 3lb AWs - continue lateral and forward/backwards stepping over obstacles (progress height as able)  - progress to lateral foot taps on step? - standing marches/foot taps/hip flexion for improved foot clearance with AWs - sit<>stands  - try weighted vest? - standing reaching overhead/upright for improved hip extension and upright posture - gait training using RW    Brigitt Mcclish, PT, DPT, NCS, CSRS Physical Therapist -   [facility]  2:13 PM 06/18/24

## 2024-06-20 ENCOUNTER — Ambulatory Visit

## 2024-06-20 DIAGNOSIS — M6281 Muscle weakness (generalized): Secondary | ICD-10-CM | POA: Diagnosis not present

## 2024-06-20 DIAGNOSIS — R278 Other lack of coordination: Secondary | ICD-10-CM

## 2024-06-20 DIAGNOSIS — R2681 Unsteadiness on feet: Secondary | ICD-10-CM

## 2024-06-20 NOTE — Therapy (Signed)
 OUTPATIENT PHYSICAL THERAPY TREATMENT   Patient Name: Kerri [name] MRN: [id number] DOB:[birth year], 89 y.o., female Today's Date: 06/20/2024   PCP: Cyrus Selinda Moose, PA-C  REFERRING PROVIDER: Cyrus Selinda Moose, PA-C   END OF SESSION:   PT End of Session - 06/20/24 1323     Visit Number 19    Number of Visits 48    Date for PT Re-Evaluation 08/22/24    Authorization Type [employer] [id number] for 24 PT vsts from 7/21-10/13 (9/8 is 10 of 24)    Authorization Time Period [facility] [id number] for 3 PT vsts from 6/16-7/14 (7/14 is 3/3)    Progress Note Due on Visit 20    PT Start Time 1317    PT Stop Time 1357    PT Time Calculation (min) 40 min    Equipment Utilized During Treatment Gait belt    Activity Tolerance Patient tolerated treatment well;No increased pain    Behavior During Therapy WFL for tasks assessed/performed              Past Medical History:  Diagnosis Date   Arthritis    Chronic systolic heart failure (HCC)    CKD (chronic kidney disease), stage III (HCC)    Dementia (HCC)    Hypertension    Hypothyroidism    Overactive bladder    Past Surgical History:  Procedure Laterality Date   INTRAMEDULLARY (IM) NAIL INTERTROCHANTERIC Left 09/06/2022   Procedure: INTRAMEDULLARY (IM) NAIL INTERTROCHANTERIC;  Surgeon: Tobie Priest, MD;  Location: [facility] ORS;  Service: Orthopedics;  Laterality: Left;   PACEMAKER LEADLESS INSERTION N/A 01/07/2021   Procedure: PACEMAKER LEADLESS INSERTION;  Surgeon: Ammon Blunt, MD;  Location: [facility] INVASIVE CV LAB;  Service: Cardiovascular;  Laterality: N/A;   PPM GENERATOR REMOVAL N/A 01/07/2021   Procedure: PPM GENERATOR REMOVAL;  Surgeon: Ammon Blunt, MD;  Location: [facility] INVASIVE CV LAB;  Service: Cardiovascular;  Laterality: N/A;   Patient Active Problem List   Diagnosis Date Noted   AKI (acute kidney injury) (HCC) 01/12/2024   Acute postoperative anemia due to expected blood loss 09/07/2022   Displaced  intertrochanteric fracture of left femur, initial encounter for closed fracture (HCC) 09/05/2022   Orthostatic hypotension 07/12/2022   Diarrhea 07/10/2022   COVID-19 virus infection 07/08/2022   Electrolyte abnormality 07/08/2022   Malnutrition of moderate degree 07/08/2022   Acute delirium 07/08/2022   History of recurrent UTI (urinary tract infection) 01/30/2022   Constipation    UTI (urinary tract infection) 10/15/2021   Elevated brain natriuretic peptide (BNP) level 10/15/2021   Generalized weakness 10/15/2021   Chronic kidney disease, stage 3b (HCC) 10/15/2021   Normal pressure hydrocephalus (HCC) 10/15/2021   Dementia without behavioral disturbance (HCC) 10/15/2021   Hypothyroidism 10/15/2021   Essential hypertension 10/15/2021   Overactive bladder 10/15/2021   Moderate mitral regurgitation 01/13/2021   Status post placement of cardiac pacemaker 01/13/2021   CHB (complete heart block) (HCC) 01/07/2021   Chronic systolic CHF (congestive heart failure) (HCC) 12/17/2020   Postmenopausal osteoporosis 08/26/2016   Mixed Alzheimer's and vascular dementia (HCC) 06/13/2014    ONSET DATE: Progressive decline over ~1-1.5 years  REFERRING DIAG:  M51.369 (ICD-10-CM) - Other intervertebral disc degeneration, lumbar region without mention of lumbar back pain or lower extremity pain  R53.81 (ICD-10-CM) - Other malaise  M62.81 (ICD-10-CM) - Muscle weakness (generalized)    THERAPY DIAG:  Muscle weakness (generalized)  Other lack of coordination  Unsteadiness on feet  Rationale for Evaluation and Treatment: Rehabilitation  SUBJECTIVE:  SUBJECTIVE STATEMENT:  Pt at [facility] for physical yesterday. Pt doing well today, but woke up a little on the tired side.   PERTINENT HISTORY:  PMH: hypothyroidism,  recurrent UTIs, osteoarthritis, prior anterior trochanter fracture, CKD stage IIIb, chronic systolic heart failure, dementia, hypertension, history of cardiac pacemaker, history of normal pressure hydrocephalus, overreactive bladder   Most recent A&P from Neurology note on 12/21/2023: Mixed dementia (Alzheimer's disease + vascular dementia) with significant ventriculomegaly (with negative lumbar drain trial in 2015) in a patient with history of sepsis secondary to UTI treated with trimethoprim  January 2023. Medication appears to help with UTI and improved mental status - reports good control with current medication Progressive behavioral disturbances with mood swings, outbursts, and apathy, exacerbated during the day. Seroquel (quetiapine) was initiated but caused significant lethargy. Alternative medications, lamotrigine and Depakote, were discussed for her mood-stabilizing properties and different tolerability profiles. - Discuss with primary care provider about reducing Seroquel dose to assess tolerability. Consider 1/2 tablet (12.5 mg) instead.  - If unable to tolerate Seroquel at lower dose, will consider trial of Depakote Valproic Acid is an antiepileptic drug, used for many different conditions. Patient was advised not to stop medication abruptly, not to use alcohol. Acute side-effects can be abdominal pain, N/V, diarrhea and increased ammonia level. Long term side-effects are hair loss, weight gain, tremors and amenorrhea. Rarely, it can cause low platelets, liver failure and birth defects.  - Consider alternative medications such as lamotrigine or Depakote if Seroquel remains intolerable. - Document alternative medications in the chart for future reference.  Fatigue and somnolence Increased fatigue and somnolence potentially related to Seroquel, hypothyroidism, and medication side effects.  - Monitor heart rate and adjust medications if fatigue persists and heart rate remains low.  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 Symptoms include leg pain, particularly in the right leg, and dragging of the leg during ambulation. Physical therapy has been beneficial. Her symptoms arte chronic. She has had a reportedly negative lower extremity doppler, unavailable for my review.  Right leg pain with inability to pick up feet (right>left - likely from ventriculomegaly + peripheral neuropathy) patient with history of lumbar disc disease with stenosis - reports improvement during recent trial of prednisone. Suspicious for lumbar Disc Disease in addition to decondition Reviewed PCP note: Ongoing leg pain with intermittent weakness. On exam she does have some swelling around the right knee. Unsure if she could have some severe arthritis catching as she walks. Will obtain x-ray of the right knee today. Trial of prednisone 20 mg once a day for 5 days. We did discuss that she has some degenerative lumbar disc disease with stenosis and likely playing a role in right leg weakness. She has a pacemaker and we will defer MRI of the lumbar spine. Keep follow-up with neurology and if they feel it is necessary they could possibly pursue nerve conduction study. Inform caretaker to help patient and stay by her side when walking.   - Patient says that her legs feel heavy and that she has difficulty initiating the first step when walking - consistent with magnetic gait in patient with hydrocephalus and brain atrophy - Previous (-) Lumbar Puncture trial at [facility] (01/09/14). - Continue physical therapy to manage symptoms and improve mobility. - strict Emergency Room/return precautions   PAIN:  Are you having pain? No  PRECAUTIONS: Fall and ICD/Pacemaker  WEIGHT BEARING RESTRICTIONS: No  FALLS: Has patient fallen in last  6 months? No  LIVING ENVIRONMENT: Lives with: caregiver 7 days a week. Alone for an  hour or 2 a day, but only when alseep.   Lives in: House/apartment Stairs: Yes: Internal: 1 but ramp in place steps; none and External: 1 steps; none Has following equipment at home: Walker - 2 wheeled; transport chair; shower chair and grab bars  PLOF: Needs assistance with ADLs, Needs assistance with gait, Needs assistance with transfers, and Needs assistance with ambulation using RW - Primarily uses transport chair for mobility, but will ambulate with assistance to/from bathroom using RW in the home  PATIENT GOALS: Help her walking be easier and help her get stronger   OBJECTIVE:  Note: Objective measures were completed at Evaluation unless otherwise noted.  COGNITION: Overall cognitive status: History of cognitive impairments - at baseline   SENSATION: Light touch: WFL on screen, but pt with hx of neuropathy in B LEs  POSTURE: rounded shoulders, forward head, increased thoracic kyphosis, posterior pelvic tilt, and flexed trunk    LOWER EXTREMITY ROM:     Active  Right Eval Left Eval  Hip flexion Limited AROM by weakness Limited AROM by weakness (more impaired than R)  Hip extension Decreased ROM Decreased ROM  Knee flexion [facility] WFL  Knee extension [facility]  Ankle dorsiflexion [facility] WFL  Ankle plantarflexion [facility] WFL  Ankle inversion    Ankle eversion     (Blank rows = not tested)  LOWER EXTREMITY MMT:    MMT Right Eval Left Eval  Hip flexion 3+ 3-  Knee flexion 4- 4-  Knee extension 4- 4-  Ankle dorsiflexion 4- 3-  Ankle plantarflexion 3+ 4-  (Blank rows = not tested)  BED MOBILITY:  Findings: Sit to supine Min A for B LE management onto mat Supine to sit Min A for trunk upright with continued impaired motor plan Pt doesn't recall previously trained logroll technique to increase her independence, could be due to impaired carryover of technique in the clinic space Caregiver reports bed mobility has improved slightly at home  Pt with significant difficulty performing  anterior scooting towards EOM requiring mod A for this  TRANSFERS: Sit to stand: Min A  Assistive device utilized: Environmental consultant - 2 wheeled     Stand to sit: Min A  Assistive device utilized: Environmental consultant - 2 wheeled     Chair to chair: Min A  Assistive device utilized: Environmental consultant - 2 wheeled      *continues to require cuing to turn fully prior to initiating sitting  GAIT: Findings: Gait Characteristics: step to pattern, step through pattern, decreased step length- Right, decreased step length- Left, decreased stride length, decreased hip/knee flexion- Right, decreased hip/knee flexion- Left, decreased ankle dorsiflexion- Right, decreased ankle dorsiflexion- Left, trunk flexed, narrow BOS, poor foot clearance- Right, and poor foot clearance- Left, Distance walked: ~69ft x2, Assistive device utilized:Walker - 2 wheeled, Level of assistance: Min A, and Comments: severely decreased gait speed  FUNCTIONAL TESTS:  5 times sit to stand: 83 seconds Timed up and go (TUG):  22min54 seconds using RW with min A 10 meter walk test: needs to be assessed  PATIENT SURVEYS:  PsFS: average: 3.67  Patient will completely turn 90degrees prior to sitting down during transfers with minimal cuing: 3  Patient will be able to maintain standing balance using B UE support on RW with only SBA while aide provides total A for LB clothing or ADLs: 5 Patient will stand with erect/upright posture when walking: 3  TREATMENT DATE: 06/20/2024   -minA SPT transport chair to guest chair -STS from chair (pull to stand are // bars) x5 minA  -STS from chair + airex, pull to stand x5 (minGuardA) -AMB in // bars 4ft with 4x 180 degree turnarounds (heavy cues for safe step placement)   -178/12mmHg 63bpm 99% SpO2 (this is vitals)   -lateral step over QC and step up 2, and return c bar support -STS from chair + airex to RW  x5 (minA for last 3) -STS from chair + airex and 16 6 step taps (heavy UE support with progressive decline in leg strength)  -STS from chair + airex and 16 6 step taps      PATIENT EDUCATION: Education details: Pt educated throughout session about proper posture and technique with exercises. Improved exercise technique, movement at target joints, use of target muscles after min to mod verbal, visual, tactile cues.  Person educated: Patient and Caregiver [name] Education method: Explanation Education comprehension: verbalized understanding and needs further education  HOME EXERCISE PROGRAM:  Access Code: [email or web address] URL: [email or web address] Date: 12/20/2023 Prepared by: Connell Kiss   Exercises - Sit to Stand with Armchair  - 1 x daily - 7 x weekly - 3 sets - 5 reps - Standing March with Counter Support  - 1 x daily - 7 x weekly - 2 sets - 10 reps - Supine Bridge  - 1 x daily - 7 x weekly - 2 sets - 10 reps - Supine Heel Slide with Strap  - 1 x daily - 7 x weekly - 2 sets - 10 reps - Supine Hip Abduction AROM  - 1 x daily - 7 x weekly - 3 sets - 10 reps - Seated Long Arc Quad  - 1 x daily - 7 x weekly - 3 sets - 10 reps - Seated March  - 1 x daily - 7 x weekly - 3 sets - 10 reps - 2 hold - Seated Hip Abduction with Resistance  - 1 x daily - 7 x weekly - 2 sets - 10 reps   GOALS: Goals reviewed with patient? Yes  SHORT TERM GOALS: Target date: 07/11/2024   Patient will be independent in home exercise program to improve strength/mobility for better functional independence with ADLs.  Baseline: need to initiate 05/14/2024: Caregiver states engaging patient in this as much as possible Goal status: MET   LONG TERM GOALS: Target date: 08/22/2024  Patient will perform bed mobility with consistently no more than min A utilizing bed features/supports as needed and with no more than than min cuing from aide/caregiver.  Baseline: requires up to mod A and mod/max verbal  cuing 05/14/2024: still requiring mod A and mod cuing with caregiver reporting pt having increased leg pain in the morning impacting progress with this 05/30/2024: still requiring mod A and mod cuing, pt continues to have R LE pain in the mornings impacting independence with this activity Goal status: IN PROGRESS  2.  Patient will increase PsFS score to equal to or greater than and average of 2 points to demonstrate statistically significant improvement in mobility and quality of life.  Baseline: 3.667 05/14/2024: 4.66 05/30/2024:  5.33 Goal status: IN PROGRESS  3.  Patient (> 4 years old) will complete five times sit to stand test in < 30 seconds indicating an increased LE strength and improved balance.  Baseline: 83 seconds from her transport chair with RW in front of her and min progressed to light mod A  05/14/2024:  43.34 seconds from her transport chair with RW in front of her and min progressed to light mod A  05/30/2024: 36.93 seconds from transport chair to RW with min progressed to light mod A (on 2nd trial) Goal status: IN PROGRESS   4.  Patient will reduce timed up and go to <60 sec seconds to reduce fall risk and demonstrate improved transfer/gait ability.  Baseline: 47min54 seconds using RW with min A  05/14/2024: 4min57 seconds using RW with min A  05/30/2024:  2 min 2 seconds using RW with skilled min A for lifting to stand and balance/AD management during gait Goal status: IN PROGRESS  5.  Patient will increase 10 meter walk test by 0.58m/s as to improve gait speed for better household level ambulation and to reduce fall risk.  Baseline: 0.074m/s with RW  05/14/2024: 0.085 m/s using RW with min A 05/30/2024: 0.107 m/s using RW with skilled min A  Goal status: IN PROGRESS   ASSESSMENT:  CLINICAL IMPRESSION:    Tried to advance overground, weightbearing activity today. Fatigue and weakness remain big drivers of limitations overall, but pt is well aware of her limitations and  communicates needs for breaks as they come. Pt has rapid loss in confident single limb support phase over 2nd to 3rd minute of each standing activity, improved performance noted when authro provides min-modA of trunk to reduce exceeive pelvis movements. Ms. Sherrard will benefit from further skilled PT to improve these deficits in order to increase QOL and ease/safety with ADLs.  OBJECTIVE IMPAIRMENTS: Abnormal gait, decreased activity tolerance, decreased balance, decreased cognition, decreased coordination, decreased endurance, decreased knowledge of condition, decreased knowledge of use of DME, decreased mobility, difficulty walking, decreased ROM, decreased strength, impaired flexibility, impaired sensation, impaired UE functional use, and pain.   ACTIVITY LIMITATIONS: carrying, lifting, sitting, standing, transfers, bed mobility, continence, bathing, toileting, dressing, self feeding, reach over head, hygiene/grooming, and locomotion level  PARTICIPATION LIMITATIONS: meal prep, cleaning, laundry, shopping, and community activity  PERSONAL FACTORS: Age, Time since onset of injury/illness/exacerbation, and 3+ comorbidities: hypothyroidism, recurrent UTIs, osteoarthritis, prior anterior trochanter fracture, CKD stage IIIb, chronic systolic heart failure, dementia, hypertension, history of cardiac pacemaker, history of normal pressure hydrocephalus, overreactive bladder are also affecting patient's functional outcome.   REHAB POTENTIAL: Good  CLINICAL DECISION MAKING: Evolving/moderate complexity  EVALUATION COMPLEXITY: Moderate  PLAN:  PT FREQUENCY: 1-2x/week  PT DURATION: 12 weeks  PLANNED INTERVENTIONS: 97164- PT Re-evaluation, 97750- Physical Performance Testing, 97110-Therapeutic exercises, 97530- Therapeutic activity, 97112- Neuromuscular re-education, 97535- Self Care, [id number]- Manual therapy, [phone]- Gait training, [phone]- Canalith repositioning, Patient/Family education, Balance training,  Stair training, Joint mobilization, Vestibular training, Visual/preceptual remediation/compensation, Cognitive remediation, DME instructions, Cryotherapy, Moist heat, and Biofeedback  PLAN FOR NEXT SESSIONS:    *continue tub transfer training 1 additional session *gait training in small spaces where RW will not fit - continue gait training and functional B LE strengthening in // bars  - progress to 3lb AWs - continue lateral and forward/backwards stepping over obstacles (progress height as able)  - progress to lateral foot taps on step? - standing marches/foot taps/hip flexion for improved foot clearance with AWs - sit<>stands  - try weighted vest? - standing reaching overhead/upright for improved hip extension and upright posture - gait training using RW  1:26 PM, 06/20/24 Peggye JAYSON Linear, PT, DPT Physical Therapist - [facility]  Outpatient Physical Therapy- Main Campus [phone]

## 2024-06-25 ENCOUNTER — Ambulatory Visit: Admitting: Physical Therapy

## 2024-06-25 ENCOUNTER — Ambulatory Visit

## 2024-06-25 DIAGNOSIS — R269 Unspecified abnormalities of gait and mobility: Secondary | ICD-10-CM

## 2024-06-25 DIAGNOSIS — R2681 Unsteadiness on feet: Secondary | ICD-10-CM

## 2024-06-25 DIAGNOSIS — R5381 Other malaise: Secondary | ICD-10-CM

## 2024-06-25 DIAGNOSIS — M6281 Muscle weakness (generalized): Secondary | ICD-10-CM | POA: Diagnosis not present

## 2024-06-25 DIAGNOSIS — R262 Difficulty in walking, not elsewhere classified: Secondary | ICD-10-CM

## 2024-06-25 DIAGNOSIS — R278 Other lack of coordination: Secondary | ICD-10-CM

## 2024-06-25 NOTE — Therapy (Signed)
 OUTPATIENT PHYSICAL THERAPY TREATMENT Physical Therapy Progress Note   Dates of reporting period  05/14/2024   to   06/25/2024    Patient Name: Kerri [name] MRN: [id number] DOB:29-Mar-1931, 89 y.o., female Today's Date: 06/25/2024   PCP: Cyrus Selinda Moose, PA-C  REFERRING PROVIDER: Cyrus Selinda Moose, PA-C   END OF SESSION:   PT End of Session - 06/25/24 1319     Visit Number 20    Number of Visits 48    Date for PT Re-Evaluation 08/22/24    Authorization Type [employer] [id number] for 24 PT vsts from 7/21-10/13 (9/15 is 12 of 24)    Authorization Time Period --    Progress Note Due on Visit --    PT Start Time 1318    PT Stop Time 1400    PT Time Calculation (min) 42 min    Equipment Utilized During Treatment Gait belt    Activity Tolerance Patient tolerated treatment well;No increased pain    Behavior During Therapy WFL for tasks assessed/performed               Past Medical History:  Diagnosis Date   Arthritis    Chronic systolic heart failure (HCC)    CKD (chronic kidney disease), stage III (HCC)    Dementia (HCC)    Hypertension    Hypothyroidism    Overactive bladder    Past Surgical History:  Procedure Laterality Date   INTRAMEDULLARY (IM) NAIL INTERTROCHANTERIC Left 09/06/2022   Procedure: INTRAMEDULLARY (IM) NAIL INTERTROCHANTERIC;  Surgeon: Tobie Priest, MD;  Location: [facility] ORS;  Service: Orthopedics;  Laterality: Left;   PACEMAKER LEADLESS INSERTION N/A 01/07/2021   Procedure: PACEMAKER LEADLESS INSERTION;  Surgeon: Ammon Blunt, MD;  Location: [facility];  Service: Cardiovascular;  Laterality: N/A;   PPM GENERATOR REMOVAL N/A 01/07/2021   Procedure: PPM GENERATOR REMOVAL;  Surgeon: Ammon Blunt, MD;  Location: [facility];  Service: Cardiovascular;  Laterality: N/A;   Patient Active Problem List   Diagnosis Date Noted   AKI (acute kidney injury) (HCC) 01/12/2024   Acute postoperative anemia due to  expected blood loss 09/07/2022   Displaced intertrochanteric fracture of left femur, initial encounter for closed fracture (HCC) 09/05/2022   Orthostatic hypotension 07/12/2022   Diarrhea 07/10/2022   COVID-19 virus infection 07/08/2022   Electrolyte abnormality 07/08/2022   Malnutrition of moderate degree 07/08/2022   Acute delirium 07/08/2022   History of recurrent UTI (urinary tract infection) 01/30/2022   Constipation    UTI (urinary tract infection) 10/15/2021   Elevated brain natriuretic peptide (BNP) level 10/15/2021   Generalized weakness 10/15/2021   Chronic kidney disease, stage 3b (HCC) 10/15/2021   Normal pressure hydrocephalus (HCC) 10/15/2021   Dementia without behavioral disturbance (HCC) 10/15/2021   Hypothyroidism 10/15/2021   Essential hypertension 10/15/2021   Overactive bladder 10/15/2021   Moderate mitral regurgitation 01/13/2021   Status post placement of cardiac pacemaker 01/13/2021   CHB (complete heart block) (HCC) 01/07/2021   Chronic systolic CHF (congestive heart failure) (HCC) 12/17/2020   Postmenopausal osteoporosis 08/26/2016   Mixed Alzheimer's and vascular dementia (HCC) 06/13/2014    ONSET DATE: Progressive decline over ~1-1.5 years  REFERRING DIAG:  M51.369 (ICD-10-CM) - Other intervertebral disc degeneration, lumbar region without mention of lumbar back pain or lower extremity pain  R53.81 (ICD-10-CM) - Other malaise  M62.81 (ICD-10-CM) - Muscle weakness (generalized)    THERAPY DIAG:  Muscle weakness (generalized)  Other lack of coordination  Unsteadiness on feet  Debility  Abnormality of gait and mobility  Difficulty in walking, not elsewhere classified  Rationale for Evaluation and Treatment: Rehabilitation  SUBJECTIVE:                                                                                                                                                                                             SUBJECTIVE  STATEMENT:  Pt's caregiver, [name], reports pt has been consistently stating her R LE has been hurting since yesterday, stating pt has been complaining of it hurting all the way down her leg. [name] reports pt states my leg is so sore. Denies any known injury to the R LE and no bruising. Denies falls. Reports pt went to physical with MD on 06/19/2024 and was told pt's blood work was great. Caregiver reports pt is still on the same medication to help with mood. Caregiver reports pt is still getting in the bed about the same at night, likely due to fatigue; however, does state pt is getting up/down from a chair better. Reports pt has also been getting out of the shower a little easier. Caregiver reports last week pt was asking to walk more consistently, but states pt hasn't been asking to do that as much the past few days, likely associated with increased R LE pain/soreness. Caregiver reports premedicating pt with tylenol  and icy hot for therapy today.    PERTINENT HISTORY:  PMH: hypothyroidism, recurrent UTIs, osteoarthritis, prior anterior trochanter fracture, CKD stage IIIb, chronic systolic heart failure, dementia, hypertension, history of cardiac pacemaker, history of normal pressure hydrocephalus, overreactive bladder   Most recent A&P from Neurology note on 12/21/2023: Mixed dementia (Alzheimer's disease + vascular dementia) with significant ventriculomegaly (with negative lumbar drain trial in 2015) in a patient with history of sepsis secondary to UTI treated with trimethoprim  January 2023. Medication appears to help with UTI and improved mental status - reports good control with current medication Progressive behavioral disturbances with mood swings, outbursts, and apathy, exacerbated during the day. Seroquel (quetiapine) was initiated but caused significant lethargy. Alternative medications, lamotrigine and Depakote, were discussed for her mood-stabilizing properties and different tolerability  profiles. - Discuss with primary care provider about reducing Seroquel dose to assess tolerability. Consider 1/2 tablet (12.5 mg) instead.  - If unable to tolerate Seroquel at lower dose, will consider trial of Depakote Valproic Acid is an antiepileptic drug, used for many different conditions. Patient was advised not to stop medication abruptly, not to use alcohol. Acute side-effects can be abdominal pain, N/V, diarrhea and increased ammonia level. Long term side-effects are hair loss, weight gain, tremors and amenorrhea. Rarely, it can cause low platelets,  liver failure and birth defects.  - Consider alternative medications such as lamotrigine or Depakote if Seroquel remains intolerable. - Document alternative medications in the chart for future reference.  Fatigue and somnolence Increased fatigue and somnolence potentially related to Seroquel, hypothyroidism, and medication side effects.  - Monitor heart rate and adjust medications if fatigue persists and heart rate remains low.  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 Symptoms include leg pain, particularly in the right leg, and dragging of the leg during ambulation. Physical therapy has been beneficial. Her symptoms arte chronic. She has had a reportedly negative lower extremity doppler, unavailable for my review.  Right leg pain with inability to pick up feet (right>left - likely from ventriculomegaly + peripheral neuropathy) patient with history of lumbar disc disease with stenosis - reports improvement during recent trial of prednisone. Suspicious for lumbar Disc Disease in addition to decondition Reviewed PCP note: Ongoing leg pain with intermittent weakness. On exam she does have some swelling around the right knee. Unsure if she could have some severe arthritis catching as she walks. Will obtain x-ray of the right knee today. Trial  of prednisone 20 mg once a day for 5 days. We did discuss that she has some degenerative lumbar disc disease with stenosis and likely playing a role in right leg weakness. She has a pacemaker and we will defer MRI of the lumbar spine. Keep follow-up with neurology and if they feel it is necessary they could possibly pursue nerve conduction study. Inform caretaker to help patient and stay by her side when walking.   - Patient says that her legs feel heavy and that she has difficulty initiating the first step when walking - consistent with magnetic gait in patient with hydrocephalus and brain atrophy - Previous (-) Lumbar Puncture trial at [facility] (01/09/14). - Continue physical therapy to manage symptoms and improve mobility. - strict Emergency Room/return precautions   PAIN:  Are you having pain? No  PRECAUTIONS: Fall and ICD/Pacemaker  WEIGHT BEARING RESTRICTIONS: No  FALLS: Has patient fallen in last 6 months? No  LIVING ENVIRONMENT: Lives with: caregiver 7 days a week. Alone for an hour or 2 a day, but only when alseep.   Lives in: House/apartment Stairs: Yes: Internal: 1 but ramp in place steps; none and External: 1 steps; none Has following equipment at home: Walker - 2 wheeled; transport chair; shower chair and grab bars  PLOF: Needs assistance with ADLs, Needs assistance with gait, Needs assistance with transfers, and Needs assistance with ambulation using RW - Primarily uses transport chair for mobility, but will ambulate with assistance to/from bathroom using RW in the home  PATIENT GOALS: Help her walking be easier and help her get stronger   OBJECTIVE:  Note: Objective measures were completed at Evaluation unless otherwise noted.  COGNITION: Overall cognitive status: History of cognitive impairments - at baseline   SENSATION: Light touch: WFL on screen, but pt with hx of neuropathy in B LEs  POSTURE: rounded shoulders, forward head, increased thoracic kyphosis, posterior  pelvic tilt, and flexed trunk    LOWER EXTREMITY ROM:     Active  Right Eval Left Eval  Hip flexion Limited AROM by weakness Limited AROM by weakness (more impaired than R)  Hip extension Decreased ROM Decreased ROM  Knee flexion [facility]  Knee extension [facility]  Ankle dorsiflexion [facility]  Ankle plantarflexion [facility] WFL  Ankle inversion    Ankle  eversion     (Blank rows = not tested)  LOWER EXTREMITY MMT:    MMT Right Eval Left Eval  Hip flexion 3+ 3-  Knee flexion 4- 4-  Knee extension 4- 4-  Ankle dorsiflexion 4- 3-  Ankle plantarflexion 3+ 4-  (Blank rows = not tested)  BED MOBILITY:  Findings: Sit to supine Min A for B LE management onto mat Supine to sit Min A for trunk upright with continued impaired motor plan Pt doesn't recall previously trained logroll technique to increase her independence, could be due to impaired carryover of technique in the clinic space Caregiver reports bed mobility has improved slightly at home  Pt with significant difficulty performing anterior scooting towards EOM requiring mod A for this  TRANSFERS: Sit to stand: Min A  Assistive device utilized: Environmental consultant - 2 wheeled     Stand to sit: Min A  Assistive device utilized: Environmental consultant - 2 wheeled     Chair to chair: Min A  Assistive device utilized: Environmental consultant - 2 wheeled      *continues to require cuing to turn fully prior to initiating sitting  GAIT: Findings: Gait Characteristics: step to pattern, step through pattern, decreased step length- Right, decreased step length- Left, decreased stride length, decreased hip/knee flexion- Right, decreased hip/knee flexion- Left, decreased ankle dorsiflexion- Right, decreased ankle dorsiflexion- Left, trunk flexed, narrow BOS, poor foot clearance- Right, and poor foot clearance- Left, Distance walked: ~15ft x2, Assistive device utilized:Walker - 2 wheeled, Level of assistance: Min A, and Comments: severely decreased gait speed  FUNCTIONAL TESTS:  5 times sit  to stand: 83 seconds Timed up and go (TUG):  85min54 seconds using RW with min A 10 meter walk test: needs to be assessed  PATIENT SURVEYS:  PsFS: average: 3.67  Patient will completely turn 90degrees prior to sitting down during transfers with minimal cuing: 3  Patient will be able to maintain standing balance using B UE support on RW with only SBA while aide provides total A for LB clothing or ADLs: 5 Patient will stand with erect/upright posture when walking: 3                                                                                                                             TREATMENT DATE: 06/25/2024   Pt arrives to therapy in her personal transport chair. Caregiver, [name], present.  Therapy session focused on re-assessment of standardized outcome measures and subjective questionnaire to determine pt's progress with therapy thus far.  PsFS: average: 6 with patient having a 2 point improvement on both standing balance and upright posture!            Patient will completely turn 90degrees prior to sitting down during transfers with minimal cuing: 7/10            Patient will be able to maintain standing balance using B UE support on RW with only SBA while aide provides total A for  LB clothing or ADLs: 6/10 - states with recent increased R LE pain, she is having more of a crouched posture - Caregiver states this still varies based on the day Patient will stand with erect/upright posture when walking: 5/10 - Caregiver reports pt does still require cuing for this   Five times Sit to Stand Test (FTSS) "Stand up and sit down as quickly as possible 5 times, keeping your arms folded across your chest."    TIME: 1st trial: 48.09 seconds using RW with skilled min A only! Patient able to remember to push up from her chair each time without cuing! 2nd trial: 42.25 seconds using RW from her personal transport chair - skilled min A for lifting to stand - pt does better with motor plan/sequencing  of pushing up from chair, leaning forward, and bringing feet back underneath BOS, with less or no cuing  Times > 13.6 seconds is associated with increased disability and morbidity (Guralnik, 2000) Times > 15 seconds is predictive of recurrent falls in healthy individuals aged 9 and older (Buatois, et al., 2008) Normal performance values in community dwelling individuals aged 60 and older (Bohannon, 2006): 60-69 years: 11.4 seconds 70-79 years: 12.6 seconds 80-89 years: 14.8 seconds  MCID: >= 2.3 seconds for Vestibular Disorders (Meretta, 2006)   Participated in Timed Up and Go (TUG): 1st trial: 55 seconds using RW with skilled light min A - pt states I can't walk fast today, this leg is no good. (Gesturing to R LE) 2nd trial: 11 seconds using RW with skilled light min A - pt continuing to report R LE pain, impacting her gait speed today  Pt also continues to require increased time for turning of AD and turning to sit Average: 2 min and 3 seconds using RW with skilled light min A for steadying & verbal cuing for AD management - impacted by R LE thigh pain/soreness Patient demonstrates high fall risk as indicated by requiring >13.5seconds to complete the TUG.    10 Meter Walk Test: Patient instructed to walk 10 meters (32.8 ft) as quickly and as safely as possible at their normal speed x2 and at a fast speed x2. Time measured from 2 meter mark to 8 meter mark to accommodate ramp-up and ramp-down.  Normal speed 1: 0.098 m/s ( & 42 seconds) Normal speed 2: 0.0757 m/s ( and 12 seconds) Average Normal speed: 0.0868 m/s using RW with skilled min A for steadying - pt continues to demo improved upright posture with increased trunk/hip/knee extension; however, does have slight antalgic gait pattern today due to R LE thigh pain/soreness with pt using UE support to off-weight that LE during stance Cut off scores: <0.4 m/s = household Ambulator, 0.4-0.8 m/s = limited community  Ambulator, >0.8 m/s = community Ambulator, >1.2 m/s = crossing a street, <1.0 = increased fall risk MCID 0.05 m/s (small), 0.13 m/s (moderate), 0.06 m/s (significant)  (ANPTA Core Set of Outcome Measures for Adults with Neurologic Conditions, 2018)   Pt repeatedly stated my leg is so sore especially when coming to stand.  Educated caregiver on avoiding repeated sit<>stands at this time and instead perform seated R LE LAQs (long arc quads) and standing activities as these do not seem to exacerbate her pain like coming to stand.      PATIENT EDUCATION: Education details: Pt educated throughout session about proper posture and technique with exercises. Improved exercise technique, movement at target joints, use of target muscles after min to mod verbal, visual,  tactile cues.  Person educated: Patient and Caregiver [name] Education method: Explanation Education comprehension: verbalized understanding and needs further education  HOME EXERCISE PROGRAM:  Access Code: [email or web address] URL: [email or web address] Date: 12/20/2023 Prepared by: Connell Kiss   Exercises - Sit to Stand with Armchair  - 1 x daily - 7 x weekly - 3 sets - 5 reps - Standing March with Counter Support  - 1 x daily - 7 x weekly - 2 sets - 10 reps - Supine Bridge  - 1 x daily - 7 x weekly - 2 sets - 10 reps - Supine Heel Slide with Strap  - 1 x daily - 7 x weekly - 2 sets - 10 reps - Supine Hip Abduction AROM  - 1 x daily - 7 x weekly - 3 sets - 10 reps - Seated Long Arc Quad  - 1 x daily - 7 x weekly - 3 sets - 10 reps - Seated March  - 1 x daily - 7 x weekly - 3 sets - 10 reps - 2 hold - Seated Hip Abduction with Resistance  - 1 x daily - 7 x weekly - 2 sets - 10 reps   GOALS: Goals reviewed with patient? Yes  SHORT TERM GOALS: Target date: 07/11/2024   Patient will be independent in home exercise program to improve strength/mobility for better functional independence with ADLs.  Baseline: need to  initiate 05/14/2024: Caregiver states engaging patient in this as much as possible Goal status: MET   LONG TERM GOALS: Target date: 08/22/2024  Patient will perform bed mobility with consistently no more than min A utilizing bed features/supports as needed and with no more than than min cuing from aide/caregiver.  Baseline: requires up to mod A and mod/max verbal cuing 05/14/2024: still requiring mod A and mod cuing with caregiver reporting pt having increased leg pain in the morning impacting progress with this 05/30/2024: still requiring mod A and mod cuing, pt continues to have R LE pain in the mornings impacting independence with this activity 06/25/2024: still requiring mod A and mod cuing, caregiver reports pt's independence with this is impacted by fatigue at night and R LE pain in AM Goal status: IN PROGRESS  2.  Patient will increase PsFS score to equal to or greater than and average of 2 points to demonstrate statistically significant improvement in mobility and quality of life.  Baseline: 3.667 05/14/2024: 4.66 05/30/2024:  5.33 06/25/2024: 6 Goal status: IN PROGRESS  3.  Patient (> 41 years old) will complete five times sit to stand test in < 30 seconds indicating an increased LE strength and improved balance.  Baseline: 83 seconds from her transport chair with RW in front of her and min progressed to light mod A  05/14/2024: 43.34 seconds from her transport chair with RW in front of her and min progressed to light mod A  05/30/2024: 36.93 seconds from transport chair to RW with min progressed to light mod A (on 2nd trial) 06/25/2024: 42.25 seconds using RW from her personal transport chair - only skilled min A for lifting to stand throughout! Limited by R thigh pain Goal status: IN PROGRESS   4.  Patient will reduce timed up and go to <60 sec seconds to reduce fall risk and demonstrate improved transfer/gait ability.  Baseline: 66min54 seconds using RW with min A  05/14/2024: 95min57 seconds  using RW with min A  05/30/2024:  2 min 2 seconds using RW with skilled min A for lifting to  stand and balance/AD management during gait 06/25/2024: 2 min and 3 seconds using RW with skilled light min A for steadying - only verbal cuing for AD management Goal status: IN PROGRESS  5.  Patient will increase 10 meter walk test by 0.16m/s as to improve gait speed for better household level ambulation and to reduce fall risk.  Baseline: 0.031m/s with RW  05/14/2024: 0.085 m/s using RW with min A 05/30/2024: 0.107 m/s using RW with skilled min A  06/25/2024:  0.0868 m/s using RW with skilled min A for steadying - speed impacted by R LE thigh pain Goal status: IN PROGRESS   ASSESSMENT:  CLINICAL IMPRESSION:     Therapy session focused on re-assessment of standardized outcome measures and subjective questionnaire to determine pt's progress with therapy thus far. Caregiver, [name], reports overall continued improvement in patient's standing balance and upright posture during gait with pt having an improvement on PsFs score. Caregiver acknowledges patient's presentation fluctuates, but overall has been improving. Caregiver reports the past 2 days patient has been consistently complaining of R LE leg soreness - appears to be primarily located in R quad muscle with no specific known cause of the onset of the pain; however, is greatest during sit to stand transition. Due to this pain, patient demonstrates a decrease on 5xSTS and times; however, consistent score on TUG. Despite no significant improvement on times of standardized outcome measures patient is performing sit<>stand transfers with decreased assistance and improved motor plan in that amount of time. Patient also is demonstrating improved upright posture with increased trunk/hip extension during gait allowing more consistent reciprocal stepping pattern. Ms. Heiden will benefit from further skilled PT to improve these deficits in order to increase QOL and  ease/safety with ADLs. Patient's condition has the potential to improve in response to therapy. Maximum improvement is yet to be obtained. The anticipated improvement is attainable and reasonable in a generally predictable time.    OBJECTIVE IMPAIRMENTS: Abnormal gait, decreased activity tolerance, decreased balance, decreased cognition, decreased coordination, decreased endurance, decreased knowledge of condition, decreased knowledge of use of DME, decreased mobility, difficulty walking, decreased ROM, decreased strength, impaired flexibility, impaired sensation, impaired UE functional use, and pain.   ACTIVITY LIMITATIONS: carrying, lifting, sitting, standing, transfers, bed mobility, continence, bathing, toileting, dressing, self feeding, reach over head, hygiene/grooming, and locomotion level  PARTICIPATION LIMITATIONS: meal prep, cleaning, laundry, shopping, and community activity  PERSONAL FACTORS: Age, Time since onset of injury/illness/exacerbation, and 3+ comorbidities: hypothyroidism, recurrent UTIs, osteoarthritis, prior anterior trochanter fracture, CKD stage IIIb, chronic systolic heart failure, dementia, hypertension, history of cardiac pacemaker, history of normal pressure hydrocephalus, overreactive bladder are also affecting patient's functional outcome.   REHAB POTENTIAL: Good  CLINICAL DECISION MAKING: Evolving/moderate complexity  EVALUATION COMPLEXITY: Moderate  PLAN:  PT FREQUENCY: 1-2x/week  PT DURATION: 12 weeks  PLANNED INTERVENTIONS: 97164- PT Re-evaluation, 97750- Physical Performance Testing, 97110-Therapeutic exercises, 97530- Therapeutic activity, 97112- Neuromuscular re-education, 97535- Self Care, [id number]- Manual therapy, [phone]- Gait training, [phone]- Canalith repositioning, Patient/Family education, Balance training, Stair training, Joint mobilization, Vestibular training, Visual/preceptual remediation/compensation, Cognitive remediation, DME instructions,  Cryotherapy, Moist heat, and Biofeedback  PLAN FOR NEXT SESSIONS:    *continue tub transfer training 1 additional session *gait training in small spaces where RW will not fit - continue gait training and functional B LE strengthening in // bars  - progress to 3lb AWs - continue lateral and forward/backwards stepping over obstacles (progress height as able)  - progress to lateral foot taps on step?  Or step-ups? - standing marches/foot taps/hip flexion for improved foot clearance with AWs - sit<>stands  - try weighted vest? - standing reaching overhead/upright for improved hip extension and upright posture - gait training using RW    Onyx Edgley, PT, DPT, NCS, CSRS Physical Therapist - [place]  [facility]  4:08 PM 06/25/24

## 2024-06-27 ENCOUNTER — Ambulatory Visit: Admitting: Physical Therapy

## 2024-06-27 DIAGNOSIS — R2681 Unsteadiness on feet: Secondary | ICD-10-CM

## 2024-06-27 DIAGNOSIS — R278 Other lack of coordination: Secondary | ICD-10-CM

## 2024-06-27 DIAGNOSIS — R269 Unspecified abnormalities of gait and mobility: Secondary | ICD-10-CM

## 2024-06-27 DIAGNOSIS — R5381 Other malaise: Secondary | ICD-10-CM

## 2024-06-27 DIAGNOSIS — M6281 Muscle weakness (generalized): Secondary | ICD-10-CM

## 2024-06-27 DIAGNOSIS — R262 Difficulty in walking, not elsewhere classified: Secondary | ICD-10-CM

## 2024-06-27 NOTE — Therapy (Signed)
 OUTPATIENT PHYSICAL THERAPY TREATMENT   Patient Name: Kerri [name] MRN: [id number] DOB:10-02-1931, 89 y.o., female Today's Date: 06/27/2024   PCP: Cyrus Selinda Moose, PA-C  REFERRING PROVIDER: Cyrus Selinda Moose, PA-C   END OF SESSION:   PT End of Session - 06/27/24 1317     Visit Number 21    Number of Visits 48    Date for PT Re-Evaluation 08/22/24    Authorization Type [employer] [id number] for 24 PT vsts from 7/21-10/13 (9/17 is 13 of 24)    Authorization Time Period [facility] [id number] for 3 PT vsts from 6/16-7/14 (7/14 is 3/3)    Progress Note Due on Visit 20    PT Start Time 1317    PT Stop Time 1400    PT Time Calculation (min) 43 min    Equipment Utilized During Treatment Gait belt    Activity Tolerance Patient tolerated treatment well;No increased pain    Behavior During Therapy WFL for tasks assessed/performed                Past Medical History:  Diagnosis Date   Arthritis    Chronic systolic heart failure (HCC)    CKD (chronic kidney disease), stage III (HCC)    Dementia (HCC)    Hypertension    Hypothyroidism    Overactive bladder    Past Surgical History:  Procedure Laterality Date   INTRAMEDULLARY (IM) NAIL INTERTROCHANTERIC Left 09/06/2022   Procedure: INTRAMEDULLARY (IM) NAIL INTERTROCHANTERIC;  Surgeon: Tobie Priest, MD;  Location: [facility] ORS;  Service: Orthopedics;  Laterality: Left;   PACEMAKER LEADLESS INSERTION N/A 01/07/2021   Procedure: PACEMAKER LEADLESS INSERTION;  Surgeon: Ammon Blunt, MD;  Location: [facility] INVASIVE [facility];  Service: Cardiovascular;  Laterality: N/A;   PPM GENERATOR REMOVAL N/A 01/07/2021   Procedure: PPM GENERATOR REMOVAL;  Surgeon: Ammon Blunt, MD;  Location: [facility] INVASIVE CV LAB;  Service: Cardiovascular;  Laterality: N/A;   Patient Active Problem List   Diagnosis Date Noted   AKI (acute kidney injury) (HCC) 01/12/2024   Acute postoperative anemia due to expected blood loss 09/07/2022    Displaced intertrochanteric fracture of left femur, initial encounter for closed fracture (HCC) 09/05/2022   Orthostatic hypotension 07/12/2022   Diarrhea 07/10/2022   COVID-19 virus infection 07/08/2022   Electrolyte abnormality 07/08/2022   Malnutrition of moderate degree 07/08/2022   Acute delirium 07/08/2022   History of recurrent UTI (urinary tract infection) 01/30/2022   Constipation    UTI (urinary tract infection) 10/15/2021   Elevated brain natriuretic peptide (BNP) level 10/15/2021   Generalized weakness 10/15/2021   Chronic kidney disease, stage 3b (HCC) 10/15/2021   Normal pressure hydrocephalus (HCC) 10/15/2021   Dementia without behavioral disturbance (HCC) 10/15/2021   Hypothyroidism 10/15/2021   Essential hypertension 10/15/2021   Overactive bladder 10/15/2021   Moderate mitral regurgitation 01/13/2021   Status post placement of cardiac pacemaker 01/13/2021   CHB (complete heart block) (HCC) 01/07/2021   Chronic systolic CHF (congestive heart failure) (HCC) 12/17/2020   Postmenopausal osteoporosis 08/26/2016   Mixed Alzheimer's and vascular dementia (HCC) 06/13/2014    ONSET DATE: Progressive decline over ~1-1.5 years  REFERRING DIAG:  M51.369 (ICD-10-CM) - Other intervertebral disc degeneration, lumbar region without mention of lumbar back pain or lower extremity pain  R53.81 (ICD-10-CM) - Other malaise  M62.81 (ICD-10-CM) - Muscle weakness (generalized)    THERAPY DIAG:  Muscle weakness (generalized)  Debility  Other lack of coordination  Abnormality of gait and mobility  Unsteadiness on feet  Difficulty in  walking, not elsewhere classified  Rationale for Evaluation and Treatment: Rehabilitation  SUBJECTIVE:                                                                                                                                                                                             SUBJECTIVE STATEMENT: Pt states that she is doing good  today. Pt's caregiver is reporting that pt is still reporting some leg soreness, but she has reported it less frequently, usually with prompting. Continuing to use tylenol  & icy hot prior to PT this date. No other updates at this time.     PERTINENT HISTORY:  PMH: hypothyroidism, recurrent UTIs, osteoarthritis, prior anterior trochanter fracture, CKD stage IIIb, chronic systolic heart failure, dementia, hypertension, history of cardiac pacemaker, history of normal pressure hydrocephalus, overreactive bladder   Most recent A&P from Neurology note on 12/21/2023: Mixed dementia (Alzheimer's disease + vascular dementia) with significant ventriculomegaly (with negative lumbar drain trial in 2015) in a patient with history of sepsis secondary to UTI treated with trimethoprim  January 2023. Medication appears to help with UTI and improved mental status - reports good control with current medication Progressive behavioral disturbances with mood swings, outbursts, and apathy, exacerbated during the day. Seroquel (quetiapine) was initiated but caused significant lethargy. Alternative medications, lamotrigine and Depakote, were discussed for her mood-stabilizing properties and different tolerability profiles. - Discuss with primary care provider about reducing Seroquel dose to assess tolerability. Consider 1/2 tablet (12.5 mg) instead.  - If unable to tolerate Seroquel at lower dose, will consider trial of Depakote Valproic Acid is an antiepileptic drug, used for many different conditions. Patient was advised not to stop medication abruptly, not to use alcohol. Acute side-effects can be abdominal pain, N/V, diarrhea and increased ammonia level. Long term side-effects are hair loss, weight gain, tremors and amenorrhea. Rarely, it can cause low platelets, liver failure and birth defects.  - Consider alternative medications such as lamotrigine or Depakote if Seroquel remains intolerable. - Document alternative  medications in the chart for future reference.  Fatigue and somnolence Increased fatigue and somnolence potentially related to Seroquel, hypothyroidism, and medication side effects.  - Monitor heart rate and adjust medications if fatigue persists and heart rate remains low.  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 Symptoms include leg pain, particularly in the right leg, and dragging of the leg during ambulation. Physical therapy has been beneficial. Her symptoms arte chronic. She has had a reportedly negative lower extremity doppler, unavailable for my review.  Right leg pain with inability to pick up feet (right>left -  likely from ventriculomegaly + peripheral neuropathy) patient with history of lumbar disc disease with stenosis - reports improvement during recent trial of prednisone. Suspicious for lumbar Disc Disease in addition to decondition Reviewed PCP note: Ongoing leg pain with intermittent weakness. On exam she does have some swelling around the right knee. Unsure if she could have some severe arthritis catching as she walks. Will obtain x-ray of the right knee today. Trial of prednisone 20 mg once a day for 5 days. We did discuss that she has some degenerative lumbar disc disease with stenosis and likely playing a role in right leg weakness. She has a pacemaker and we will defer MRI of the lumbar spine. Keep follow-up with neurology and if they feel it is necessary they could possibly pursue nerve conduction study. Inform caretaker to help patient and stay by her side when walking.   - Patient says that her legs feel heavy and that she has difficulty initiating the first step when walking - consistent with magnetic gait in patient with hydrocephalus and brain atrophy - Previous (-) Lumbar Puncture trial at [facility] (01/09/14). - Continue physical therapy to manage symptoms and improve  mobility. - strict Emergency Room/return precautions   PAIN:  Are you having pain? No  PRECAUTIONS: Fall and ICD/Pacemaker  WEIGHT BEARING RESTRICTIONS: No  FALLS: Has patient fallen in last 6 months? No  LIVING ENVIRONMENT: Lives with: caregiver 7 days a week. Alone for an hour or 2 a day, but only when alseep.   Lives in: House/apartment Stairs: Yes: Internal: 1 but ramp in place steps; none and External: 1 steps; none Has following equipment at home: Walker - 2 wheeled; transport chair; shower chair and grab bars  PLOF: Needs assistance with ADLs, Needs assistance with gait, Needs assistance with transfers, and Needs assistance with ambulation using RW - Primarily uses transport chair for mobility, but will ambulate with assistance to/from bathroom using RW in the home  PATIENT GOALS: Help her walking be easier and help her get stronger   OBJECTIVE:  Note: Objective measures were completed at Evaluation unless otherwise noted.  COGNITION: Overall cognitive status: History of cognitive impairments - at baseline   SENSATION: Light touch: WFL on screen, but pt with hx of neuropathy in B LEs  POSTURE: rounded shoulders, forward head, increased thoracic kyphosis, posterior pelvic tilt, and flexed trunk    LOWER EXTREMITY ROM:     Active  Right Eval Left Eval  Hip flexion Limited AROM by weakness Limited AROM by weakness (more impaired than R)  Hip extension Decreased ROM Decreased ROM  Knee flexion [facility] WFL  Knee extension [facility]  Ankle dorsiflexion [facility] WFL  Ankle plantarflexion [facility] WFL  Ankle inversion    Ankle eversion     (Blank rows = not tested)  LOWER EXTREMITY MMT:    MMT Right Eval Left Eval  Hip flexion 3+ 3-  Knee flexion 4- 4-  Knee extension 4- 4-  Ankle dorsiflexion 4- 3-  Ankle plantarflexion 3+ 4-  (Blank rows = not tested)  BED MOBILITY:  Findings: Sit to supine Min A for B LE management onto mat Supine to sit Min A for trunk upright with  continued impaired motor plan Pt doesn't recall previously trained logroll technique to increase her independence, could be due to impaired carryover of technique in the clinic space Caregiver reports bed mobility has improved slightly at home  Pt with significant difficulty performing anterior scooting towards EOM requiring mod A for this  TRANSFERS: Sit to stand: Min A  Assistive device utilized: Environmental consultant - 2 wheeled     Stand to sit: Min A  Assistive device utilized: Environmental consultant - 2 wheeled     Chair to chair: Min A  Assistive device utilized: Environmental consultant - 2 wheeled      *continues to require cuing to turn fully prior to initiating sitting  GAIT: Findings: Gait Characteristics: step to pattern, step through pattern, decreased step length- Right, decreased step length- Left, decreased stride length, decreased hip/knee flexion- Right, decreased hip/knee flexion- Left, decreased ankle dorsiflexion- Right, decreased ankle dorsiflexion- Left, trunk flexed, narrow BOS, poor foot clearance- Right, and poor foot clearance- Left, Distance walked: ~50ft x2, Assistive device utilized:Walker - 2 wheeled, Level of assistance: Min A, and Comments: severely decreased gait speed  FUNCTIONAL TESTS:  5 times sit to stand: 83 seconds Timed up and go (TUG):  51min54 seconds using RW with min A 10 meter walk test: needs to be assessed  PATIENT SURVEYS:  PsFS: average: 3.67  Patient will completely turn 90degrees prior to sitting down during transfers with minimal cuing: 3  Patient will be able to maintain standing balance using B UE support on RW with only SBA while aide provides total A for LB clothing or ADLs: 5 Patient will stand with erect/upright posture when walking: 3                                                                                                                             TREATMENT DATE: 06/27/2024  Pt arrives to therapy in her personal transport chair. Caregiver, [name], present.  Throughout  session, pt completing STS from personal transport chair with min A & verbal cueing for hand placement for push off.   Gait in // for BUE support, 2x down & back (~10' each way, total ~40')  Pain in R thigh area causing significant lateral lean to L side in order to offload R LE, decreased step length w/ LLE Improved L lateral lean & step length with verbal cueing & continued repetitions; intermittent verbal cueing for increased upright posture When returning to sit in transport chair, pt requiring max verbal cueing for completing full turn prior to sitting, requiring min A to guide hips to surface d/t lack of full turn  Gait 1x down & back in // for BUE support (total ~20') Continuing to report pain in R thigh, pt continuing to demo decreased R lateral lean & improved step length Pt continued to require verbal cueing for increased upright posture When returning to sit in transport chair, pt demonstrating improved ability to complete full turn prior to sitting with verbal cueing  In // bars, Foot taps w/ blaze pods on 6'' step, random setting  First bout, able to complete 2 min., ~12 hits Second bout, progressed to adding bilat 3# AWs & increase to 3 min., ~20 hits Improved foot clearance noted bilat this date for both bouts  Lat stepping with BUE support at // over small pvc x5 with 3# AWs Pt requiring verbal cueing & intermittent external target for foot placement in order to leave ample room for opposite foot; continuing to demo improved clearance with BLEs Pt continuing to have increased difficulty when stepping with RLE Able to progress to lat stepping over big pvc x3, x6 with 3# AWs Pt continuing to require verbal cueing for foot placement, as well as verbal cueing for increased upright posture  Pt able to take larger steps laterally this date, therefore requiring less external cueing this bout  Pt left  session in personal transport chair.       PATIENT EDUCATION: Education details: Pt educated throughout session about proper posture and technique with exercises. Improved exercise technique, movement at target joints, use of target muscles after min to mod verbal, visual, tactile cues.  Person educated: Patient and Caregiver [name] Education method: Explanation Education comprehension: verbalized understanding and needs further education  HOME EXERCISE PROGRAM:  Access Code: [email or web address] URL: [email or web address] Date: 12/20/2023 Prepared by: Connell Kiss   Exercises - Sit to Stand with Armchair  - 1 x daily - 7 x weekly - 3 sets - 5 reps - Standing March with Counter Support  - 1 x daily - 7 x weekly - 2 sets - 10 reps - Supine Bridge  - 1 x daily - 7 x weekly - 2 sets - 10 reps - Supine Heel Slide with Strap  - 1 x daily - 7 x weekly - 2 sets - 10 reps - Supine Hip Abduction AROM  - 1 x daily - 7 x weekly - 3 sets - 10 reps - Seated Long Arc Quad  - 1 x daily - 7 x weekly - 3 sets - 10 reps - Seated March  - 1 x daily - 7 x weekly - 3 sets - 10 reps - 2 hold - Seated Hip Abduction with Resistance  - 1 x daily - 7 x weekly - 2 sets - 10 reps   GOALS: Goals reviewed with patient? Yes  SHORT TERM GOALS: Target date: 07/11/2024   Patient will be independent in home exercise program to improve strength/mobility for better functional independence with ADLs.  Baseline: need to initiate 05/14/2024: Caregiver states engaging patient in this as much as possible Goal status: MET   LONG TERM GOALS: Target date: 08/22/2024  Patient will perform bed mobility with consistently no more than min A utilizing bed features/supports as needed and with no more than than min cuing from aide/caregiver.  Baseline: requires up to mod A and mod/max verbal cuing 05/14/2024: still requiring mod A and mod cuing with caregiver reporting pt having increased leg pain in the morning impacting  progress with this 05/30/2024: still requiring mod A and mod cuing, pt continues to have R LE pain in the mornings impacting independence with this activity 06/25/2024: still requiring mod A and mod cuing, caregiver reports pt's independence with this is impacted by fatigue at night and R LE pain in AM Goal status: IN PROGRESS  2.  Patient will increase PsFS score to equal to or greater than and average of 2 points to demonstrate statistically significant improvement in  mobility and quality of life.  Baseline: 3.667 05/14/2024: 4.66 05/30/2024:  5.33 06/25/2024: 6 Goal status: IN PROGRESS  3.  Patient (> 76 years old) will complete five times sit to stand test in < 30 seconds indicating an increased LE strength and improved balance.  Baseline: 83 seconds from her transport chair with RW in front of her and min progressed to light mod A  05/14/2024: 43.34 seconds from her transport chair with RW in front of her and min progressed to light mod A  05/30/2024: 36.93 seconds from transport chair to RW with min progressed to light mod A (on 2nd trial) 06/25/2024: 42.25 seconds using RW from her personal transport chair - only skilled min A for lifting to stand throughout! Limited by R thigh pain Goal status: IN PROGRESS   4.  Patient will reduce timed up and go to <60 sec seconds to reduce fall risk and demonstrate improved transfer/gait ability.  Baseline: 6min54 seconds using RW with min A  05/14/2024: 47min57 seconds using RW with min A  05/30/2024:  2 min 2 seconds using RW with skilled min A for lifting to stand and balance/AD management during gait 06/25/2024: 2 min and 3 seconds using RW with skilled light min A for steadying - only verbal cuing for AD management Goal status: IN PROGRESS  5.  Patient will increase 10 meter walk test by 0.106m/s as to improve gait speed for better household level ambulation and to reduce fall risk.  Baseline: 0.086m/s with RW  05/14/2024: 0.085 m/s using RW with min  A 05/30/2024: 0.107 m/s using RW with skilled min A  06/25/2024:  0.0868 m/s using RW with skilled min A for steadying - speed impacted by R LE thigh pain Goal status: IN PROGRESS   ASSESSMENT:  CLINICAL IMPRESSION:      Pt continuing to report pain in the R thigh area that seemed to improve with mobility/activity. Pt demonstrating increase foot clearance, both with tapping at step & lateral stepping; pt also able to progress to completing activities with 3# AWs this date. Pt continuing to require intermittent verbal curing for increased upright posture with activities, especially with fatigue. Pt benefits from both verbal cueing & having external targets as appropriate. Ms. Burkes will benefit from further skilled PT to improve these deficits in order to increase QOL and ease/safety with ADLs.   OBJECTIVE IMPAIRMENTS: Abnormal gait, decreased activity tolerance, decreased balance, decreased cognition, decreased coordination, decreased endurance, decreased knowledge of condition, decreased knowledge of use of DME, decreased mobility, difficulty walking, decreased ROM, decreased strength, impaired flexibility, impaired sensation, impaired UE functional use, and pain.   ACTIVITY LIMITATIONS: carrying, lifting, sitting, standing, transfers, bed mobility, continence, bathing, toileting, dressing, self feeding, reach over head, hygiene/grooming, and locomotion level  PARTICIPATION LIMITATIONS: meal prep, cleaning, laundry, shopping, and community activity  PERSONAL FACTORS: Age, Time since onset of injury/illness/exacerbation, and 3+ comorbidities: hypothyroidism, recurrent UTIs, osteoarthritis, prior anterior trochanter fracture, CKD stage IIIb, chronic systolic heart failure, dementia, hypertension, history of cardiac pacemaker, history of normal pressure hydrocephalus, overreactive bladder are also affecting patient's functional outcome.   REHAB POTENTIAL: Good  CLINICAL DECISION MAKING:  Evolving/moderate complexity  EVALUATION COMPLEXITY: Moderate  PLAN:  PT FREQUENCY: 1-2x/week  PT DURATION: 12 weeks  PLANNED INTERVENTIONS: 97164- PT Re-evaluation, 97750- Physical Performance Testing, 97110-Therapeutic exercises, 97530- Therapeutic activity, [id number]- Neuromuscular re-education, 97535- Self Care, [id number]- Manual therapy, [phone]- Gait training, [phone]- Canalith repositioning, Patient/Family education, Balance training, Stair training, Joint mobilization, Vestibular training, Visual/preceptual remediation/compensation,  Cognitive remediation, DME instructions, Cryotherapy, Moist heat, and Biofeedback  PLAN FOR NEXT SESSIONS:   *continue tub transfer training 1 additional session *gait training in small spaces where RW will not fit - continue gait training and functional B LE strengthening in // bars - continue lateral and forward/backwards stepping over obstacles (progress height as able)  - progress to lateral foot taps on step? Or step-ups? - standing marches/foot taps/hip flexion for improved foot clearance with AWs - sit<>stands  - try weighted vest? - standing reaching overhead/upright for improved hip extension and upright posture - gait training using RW    Chiquita Silvan, SPT Physical Therapy Student - [place]  [facility] 2:07 PM 06/27/24

## 2024-07-02 ENCOUNTER — Ambulatory Visit: Admitting: Physical Therapy

## 2024-07-02 ENCOUNTER — Ambulatory Visit

## 2024-07-02 NOTE — Therapy (Incomplete)
 OUTPATIENT PHYSICAL THERAPY TREATMENT   Patient Name: Kerri [name] MRN: [id number] DOB:[birth year], 89 y.o., female Today's Date: 07/02/2024   PCP: Cyrus Selinda Moose, PA-C  REFERRING PROVIDER: Cyrus Selinda Moose, PA-C   END OF SESSION: ***           Past Medical History:  Diagnosis Date   Arthritis    Chronic systolic heart failure (HCC)    CKD (chronic kidney disease), stage III (HCC)    Dementia (HCC)    Hypertension    Hypothyroidism    Overactive bladder    Past Surgical History:  Procedure Laterality Date   INTRAMEDULLARY (IM) NAIL INTERTROCHANTERIC Left 09/06/2022   Procedure: INTRAMEDULLARY (IM) NAIL INTERTROCHANTERIC;  Surgeon: Tobie Priest, MD;  Location: [facility] ORS;  Service: Orthopedics;  Laterality: Left;   PACEMAKER LEADLESS INSERTION N/A 01/07/2021   Procedure: PACEMAKER LEADLESS INSERTION;  Surgeon: Ammon Blunt, MD;  Location: [facility];  Service: Cardiovascular;  Laterality: N/A;   PPM GENERATOR REMOVAL N/A 01/07/2021   Procedure: PPM GENERATOR REMOVAL;  Surgeon: Ammon Blunt, MD;  Location: [facility];  Service: Cardiovascular;  Laterality: N/A;   Patient Active Problem List   Diagnosis Date Noted   AKI (acute kidney injury) (HCC) 01/12/2024   Acute postoperative anemia due to expected blood loss 09/07/2022   Displaced intertrochanteric fracture of left femur, initial encounter for closed fracture (HCC) 09/05/2022   Orthostatic hypotension 07/12/2022   Diarrhea 07/10/2022   COVID-19 virus infection 07/08/2022   Electrolyte abnormality 07/08/2022   Malnutrition of moderate degree 07/08/2022   Acute delirium 07/08/2022   History of recurrent UTI (urinary tract infection) 01/30/2022   Constipation    UTI (urinary tract infection) 10/15/2021   Elevated brain natriuretic peptide (BNP) level 10/15/2021   Generalized weakness 10/15/2021   Chronic kidney disease, stage 3b (HCC) 10/15/2021   Normal pressure  hydrocephalus (HCC) 10/15/2021   Dementia without behavioral disturbance (HCC) 10/15/2021   Hypothyroidism 10/15/2021   Essential hypertension 10/15/2021   Overactive bladder 10/15/2021   Moderate mitral regurgitation 01/13/2021   Status post placement of cardiac pacemaker 01/13/2021   CHB (complete heart block) (HCC) 01/07/2021   Chronic systolic CHF (congestive heart failure) (HCC) 12/17/2020   Postmenopausal osteoporosis 08/26/2016   Mixed Alzheimer's and vascular dementia (HCC) 06/13/2014    ONSET DATE: Progressive decline over ~1-1.5 years  REFERRING DIAG:  M51.369 (ICD-10-CM) - Other intervertebral disc degeneration, lumbar region without mention of lumbar back pain or lower extremity pain  R53.81 (ICD-10-CM) - Other malaise  M62.81 (ICD-10-CM) - Muscle weakness (generalized)    THERAPY DIAG: ***  No diagnosis found.  Rationale for Evaluation and Treatment: Rehabilitation  SUBJECTIVE:  SUBJECTIVE STATEMENT: ***  Pt states that she is doing good today. Pt's caregiver is reporting that pt is still reporting some leg soreness, but she has reported it less frequently, usually with prompting. Continuing to use tylenol  & icy hot prior to PT this date. No other updates at this time.     PERTINENT HISTORY:  PMH: hypothyroidism, recurrent UTIs, osteoarthritis, prior anterior trochanter fracture, CKD stage IIIb, chronic systolic heart failure, dementia, hypertension, history of cardiac pacemaker, history of normal pressure hydrocephalus, overreactive bladder   Most recent A&P from Neurology note on 12/21/2023: Mixed dementia (Alzheimer's disease + vascular dementia) with significant ventriculomegaly (with negative lumbar drain trial in 2015) in a patient with history of sepsis secondary to UTI  treated with trimethoprim  January 2023. Medication appears to help with UTI and improved mental status - reports good control with current medication Progressive behavioral disturbances with mood swings, outbursts, and apathy, exacerbated during the day. Seroquel (quetiapine) was initiated but caused significant lethargy. Alternative medications, lamotrigine and Depakote, were discussed for her mood-stabilizing properties and different tolerability profiles. - Discuss with primary care provider about reducing Seroquel dose to assess tolerability. Consider 1/2 tablet (12.5 mg) instead.  - If unable to tolerate Seroquel at lower dose, will consider trial of Depakote Valproic Acid is an antiepileptic drug, used for many different conditions. Patient was advised not to stop medication abruptly, not to use alcohol. Acute side-effects can be abdominal pain, N/V, diarrhea and increased ammonia level. Long term side-effects are hair loss, weight gain, tremors and amenorrhea. Rarely, it can cause low platelets, liver failure and birth defects.  - Consider alternative medications such as lamotrigine or Depakote if Seroquel remains intolerable. - Document alternative medications in the chart for future reference.  Fatigue and somnolence Increased fatigue and somnolence potentially related to Seroquel, hypothyroidism, and medication side effects.  - Monitor heart rate and adjust medications if fatigue persists and heart rate remains low.  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 Symptoms include leg pain, particularly in the right leg, and dragging of the leg during ambulation. Physical therapy has been beneficial. Her symptoms arte chronic. She has had a reportedly negative lower extremity doppler, unavailable for my review.  Right leg pain with inability to pick up feet (right>left - likely from  ventriculomegaly + peripheral neuropathy) patient with history of lumbar disc disease with stenosis - reports improvement during recent trial of prednisone. Suspicious for lumbar Disc Disease in addition to decondition Reviewed PCP note: Ongoing leg pain with intermittent weakness. On exam she does have some swelling around the right knee. Unsure if she could have some severe arthritis catching as she walks. Will obtain x-ray of the right knee today. Trial of prednisone 20 mg once a day for 5 days. We did discuss that she has some degenerative lumbar disc disease with stenosis and likely playing a role in right leg weakness. She has a pacemaker and we will defer MRI of the lumbar spine. Keep follow-up with neurology and if they feel it is necessary they could possibly pursue nerve conduction study. Inform caretaker to help patient and stay by her side when walking.   - Patient says that her legs feel heavy and that she has difficulty initiating the first step when walking - consistent with magnetic gait in patient with hydrocephalus and brain atrophy - Previous (-) Lumbar Puncture trial at [facility] (01/09/14). - Continue physical therapy to manage symptoms and improve  mobility. - strict Emergency Room/return precautions   PAIN:  Are you having pain? No  PRECAUTIONS: Fall and ICD/Pacemaker  WEIGHT BEARING RESTRICTIONS: No  FALLS: Has patient fallen in last 6 months? No  LIVING ENVIRONMENT: Lives with: caregiver 7 days a week. Alone for an hour or 2 a day, but only when alseep.   Lives in: House/apartment Stairs: Yes: Internal: 1 but ramp in place steps; none and External: 1 steps; none Has following equipment at home: Walker - 2 wheeled; transport chair; shower chair and grab bars  PLOF: Needs assistance with ADLs, Needs assistance with gait, Needs assistance with transfers, and Needs assistance with ambulation using RW - Primarily uses transport chair for mobility, but will ambulate with  assistance to/from bathroom using RW in the home  PATIENT GOALS: Help her walking be easier and help her get stronger   OBJECTIVE:  Note: Objective measures were completed at Evaluation unless otherwise noted.  COGNITION: Overall cognitive status: History of cognitive impairments - at baseline   SENSATION: Light touch: WFL on screen, but pt with hx of neuropathy in B LEs  POSTURE: rounded shoulders, forward head, increased thoracic kyphosis, posterior pelvic tilt, and flexed trunk    LOWER EXTREMITY ROM:     Active  Right Eval Left Eval  Hip flexion Limited AROM by weakness Limited AROM by weakness (more impaired than R)  Hip extension Decreased ROM Decreased ROM  Knee flexion [facility] WFL  Knee extension [facility]  Ankle dorsiflexion [facility] WFL  Ankle plantarflexion [facility] WFL  Ankle inversion    Ankle eversion     (Blank rows = not tested)  LOWER EXTREMITY MMT:    MMT Right Eval Left Eval  Hip flexion 3+ 3-  Knee flexion 4- 4-  Knee extension 4- 4-  Ankle dorsiflexion 4- 3-  Ankle plantarflexion 3+ 4-  (Blank rows = not tested)  BED MOBILITY:  Findings: Sit to supine Min A for B LE management onto mat Supine to sit Min A for trunk upright with continued impaired motor plan Pt doesn't recall previously trained logroll technique to increase her independence, could be due to impaired carryover of technique in the clinic space Caregiver reports bed mobility has improved slightly at home  Pt with significant difficulty performing anterior scooting towards EOM requiring mod A for this  TRANSFERS: Sit to stand: Min A  Assistive device utilized: Environmental consultant - 2 wheeled     Stand to sit: Min A  Assistive device utilized: Environmental consultant - 2 wheeled     Chair to chair: Min A  Assistive device utilized: Environmental consultant - 2 wheeled      *continues to require cuing to turn fully prior to initiating sitting  GAIT: Findings: Gait Characteristics: step to pattern, step through pattern, decreased step length-  Right, decreased step length- Left, decreased stride length, decreased hip/knee flexion- Right, decreased hip/knee flexion- Left, decreased ankle dorsiflexion- Right, decreased ankle dorsiflexion- Left, trunk flexed, narrow BOS, poor foot clearance- Right, and poor foot clearance- Left, Distance walked: ~26ft x2, Assistive device utilized:Walker - 2 wheeled, Level of assistance: Min A, and Comments: severely decreased gait speed  FUNCTIONAL TESTS:  5 times sit to stand: 83 seconds Timed up and go (TUG):  50min54 seconds using RW with min A 10 meter walk test: needs to be assessed  PATIENT SURVEYS:  PsFS: average: 3.67  Patient will completely turn 90degrees prior to sitting down during transfers with minimal cuing: 3  Patient will be able to maintain standing  balance using B UE support on RW with only SBA while aide provides total A for LB clothing or ADLs: 5 Patient will stand with erect/upright posture when walking: 3                                                                                                                             TREATMENT DATE: 07/02/2024  Pt arrives to therapy in her personal transport chair. Caregiver, [name], present.  Throughout session, pt completing STS from personal transport chair with min A & verbal cueing for hand placement for push off.   ***   Gait in // for BUE support, 2x down & back (~10' each way, total ~40')  Pain in R thigh area causing significant lateral lean to L side in order to offload R LE, decreased step length w/ LLE Improved L lateral lean & step length with verbal cueing & continued repetitions; intermittent verbal cueing for increased upright posture When returning to sit in transport chair, pt requiring max verbal cueing for completing full turn prior to sitting, requiring min A to guide hips to surface d/t lack of full turn  Gait 1x down & back in // for BUE support (total ~20') Continuing to report pain in R thigh, pt continuing to  demo decreased R lateral lean & improved step length Pt continued to require verbal cueing for increased upright posture When returning to sit in transport chair, pt demonstrating improved ability to complete full turn prior to sitting with verbal cueing                                                                                 In // bars, Foot taps w/ blaze pods on 6'' step, random setting  First bout, able to complete 2 min., ~12 hits Second bout, progressed to adding bilat 3# AWs & increase to 3 min., ~20 hits Improved foot clearance noted bilat this date for both bouts  Lat stepping with BUE support at // over small pvc x5 with 3# AWs Pt requiring verbal cueing & intermittent external target for foot placement in order to leave ample room for opposite foot; continuing to demo improved clearance with BLEs Pt continuing to have increased difficulty when stepping with RLE Able to progress to lat stepping over big pvc x3, x6 with 3# AWs Pt continuing to require verbal cueing for foot placement, as well as verbal cueing for increased upright posture  Pt able to take larger steps laterally this date, therefore requiring less external cueing this bout  Pt left session in personal transport chair.       PATIENT EDUCATION: Education details: Pt  educated throughout session about proper posture and technique with exercises. Improved exercise technique, movement at target joints, use of target muscles after min to mod verbal, visual, tactile cues.  Person educated: Patient and Caregiver [name] Education method: Explanation Education comprehension: verbalized understanding and needs further education  HOME EXERCISE PROGRAM:  Access Code: [email or web address] URL: [email or web address] Date: 12/20/2023 Prepared by: Connell Kiss   Exercises - Sit to Stand with Armchair  - 1 x daily - 7 x weekly - 3 sets - 5 reps - Standing March with Counter Support  - 1 x daily - 7 x weekly - 2 sets -  10 reps - Supine Bridge  - 1 x daily - 7 x weekly - 2 sets - 10 reps - Supine Heel Slide with Strap  - 1 x daily - 7 x weekly - 2 sets - 10 reps - Supine Hip Abduction AROM  - 1 x daily - 7 x weekly - 3 sets - 10 reps - Seated Long Arc Quad  - 1 x daily - 7 x weekly - 3 sets - 10 reps - Seated March  - 1 x daily - 7 x weekly - 3 sets - 10 reps - 2 hold - Seated Hip Abduction with Resistance  - 1 x daily - 7 x weekly - 2 sets - 10 reps   GOALS: Goals reviewed with patient? Yes  SHORT TERM GOALS: Target date: 07/11/2024   Patient will be independent in home exercise program to improve strength/mobility for better functional independence with ADLs.  Baseline: need to initiate 05/14/2024: Caregiver states engaging patient in this as much as possible Goal status: MET   LONG TERM GOALS: Target date: 08/22/2024  Patient will perform bed mobility with consistently no more than min A utilizing bed features/supports as needed and with no more than than min cuing from aide/caregiver.  Baseline: requires up to mod A and mod/max verbal cuing 05/14/2024: still requiring mod A and mod cuing with caregiver reporting pt having increased leg pain in the morning impacting progress with this 05/30/2024: still requiring mod A and mod cuing, pt continues to have R LE pain in the mornings impacting independence with this activity 06/25/2024: still requiring mod A and mod cuing, caregiver reports pt's independence with this is impacted by fatigue at night and R LE pain in AM Goal status: IN PROGRESS  2.  Patient will increase PsFS score to equal to or greater than and average of 2 points to demonstrate statistically significant improvement in mobility and quality of life.  Baseline: 3.667 05/14/2024: 4.66 05/30/2024:  5.33 06/25/2024: 6 Goal status: IN PROGRESS  3.  Patient (> 36 years old) will complete five times sit to stand test in < 30 seconds indicating an increased LE strength and improved balance.   Baseline: 83 seconds from her transport chair with RW in front of her and min progressed to light mod A  05/14/2024: 43.34 seconds from her transport chair with RW in front of her and min progressed to light mod A  05/30/2024: 36.93 seconds from transport chair to RW with min progressed to light mod A (on 2nd trial) 06/25/2024: 42.25 seconds using RW from her personal transport chair - only skilled min A for lifting to stand throughout! Limited by R thigh pain Goal status: IN PROGRESS   4.  Patient will reduce timed up and go to <60 sec seconds to reduce fall risk and demonstrate improved transfer/gait ability.  Baseline: 63min54 seconds using RW with  min A  05/14/2024: 104min57 seconds using RW with min A  05/30/2024:  2 min 2 seconds using RW with skilled min A for lifting to stand and balance/AD management during gait 06/25/2024: 2 min and 3 seconds using RW with skilled light min A for steadying - only verbal cuing for AD management Goal status: IN PROGRESS  5.  Patient will increase 10 meter walk test by 0.80m/s as to improve gait speed for better household level ambulation and to reduce fall risk.  Baseline: 0.055m/s with RW  05/14/2024: 0.085 m/s using RW with min A 05/30/2024: 0.107 m/s using RW with skilled min A  06/25/2024:  0.0868 m/s using RW with skilled min A for steadying - speed impacted by R LE thigh pain Goal status: IN PROGRESS   ASSESSMENT:  CLINICAL IMPRESSION:     ***  Pt continuing to report pain in the R thigh area that seemed to improve with mobility/activity. Pt demonstrating increase foot clearance, both with tapping at step & lateral stepping; pt also able to progress to completing activities with 3# AWs this date. Pt continuing to require intermittent verbal curing for increased upright posture with activities, especially with fatigue. Pt benefits from both verbal cueing & having external targets as appropriate. Ms. Whitmyer will benefit from further skilled PT to improve  these deficits in order to increase QOL and ease/safety with ADLs.   OBJECTIVE IMPAIRMENTS: Abnormal gait, decreased activity tolerance, decreased balance, decreased cognition, decreased coordination, decreased endurance, decreased knowledge of condition, decreased knowledge of use of DME, decreased mobility, difficulty walking, decreased ROM, decreased strength, impaired flexibility, impaired sensation, impaired UE functional use, and pain.   ACTIVITY LIMITATIONS: carrying, lifting, sitting, standing, transfers, bed mobility, continence, bathing, toileting, dressing, self feeding, reach over head, hygiene/grooming, and locomotion level  PARTICIPATION LIMITATIONS: meal prep, cleaning, laundry, shopping, and community activity  PERSONAL FACTORS: Age, Time since onset of injury/illness/exacerbation, and 3+ comorbidities: hypothyroidism, recurrent UTIs, osteoarthritis, prior anterior trochanter fracture, CKD stage IIIb, chronic systolic heart failure, dementia, hypertension, history of cardiac pacemaker, history of normal pressure hydrocephalus, overreactive bladder are also affecting patient's functional outcome.   REHAB POTENTIAL: Good  CLINICAL DECISION MAKING: Evolving/moderate complexity  EVALUATION COMPLEXITY: Moderate  PLAN:  PT FREQUENCY: 1-2x/week  PT DURATION: 12 weeks  PLANNED INTERVENTIONS: 97164- PT Re-evaluation, 97750- Physical Performance Testing, 97110-Therapeutic exercises, 97530- Therapeutic activity, 97112- Neuromuscular re-education, 97535- Self Care, [id number]- Manual therapy, [phone]- Gait training, [phone]- Canalith repositioning, Patient/Family education, Balance training, Stair training, Joint mobilization, Vestibular training, Visual/preceptual remediation/compensation, Cognitive remediation, DME instructions, Cryotherapy, Moist heat, and Biofeedback  PLAN FOR NEXT SESSIONS:  ***  *continue tub transfer training 1 additional session *gait training in small spaces where RW  will not fit - continue gait training and functional B LE strengthening in // bars - continue lateral and forward/backwards stepping over obstacles (progress height as able)  - progress to lateral foot taps on step? Or step-ups? - standing marches/foot taps/hip flexion for improved foot clearance with AWs - sit<>stands  - try weighted vest? - standing reaching overhead/upright for improved hip extension and upright posture - gait training using RW    Chiquita Silvan, SPT Physical Therapy Student - [place]  [facility] 3:14 PM 07/02/24

## 2024-07-03 ENCOUNTER — Ambulatory Visit: Admitting: Physical Therapy

## 2024-07-04 ENCOUNTER — Ambulatory Visit: Admitting: Physical Therapy

## 2024-07-04 DIAGNOSIS — R269 Unspecified abnormalities of gait and mobility: Secondary | ICD-10-CM

## 2024-07-04 DIAGNOSIS — R262 Difficulty in walking, not elsewhere classified: Secondary | ICD-10-CM

## 2024-07-04 DIAGNOSIS — M6281 Muscle weakness (generalized): Secondary | ICD-10-CM | POA: Diagnosis not present

## 2024-07-04 DIAGNOSIS — R278 Other lack of coordination: Secondary | ICD-10-CM

## 2024-07-04 DIAGNOSIS — R2681 Unsteadiness on feet: Secondary | ICD-10-CM

## 2024-07-04 DIAGNOSIS — R5381 Other malaise: Secondary | ICD-10-CM

## 2024-07-04 NOTE — Therapy (Signed)
 OUTPATIENT PHYSICAL THERAPY TREATMENT   Patient Name: Kerri [name] MRN: [id number] DOB:06-19-1931, 89 y.o., female Today's Date: 07/04/2024   PCP: Cyrus Selinda Moose, PA-C  REFERRING PROVIDER: Cyrus Selinda Moose, PA-C   END OF SESSION:   PT End of Session - 07/04/24 1317     Visit Number 22    Number of Visits 48    Date for Recertification  08/22/24    Authorization Type [employer] [id number] for 24 PT vsts from 7/21-10/13 (9/24 is 14 of 24)    Progress Note Due on Visit 20    PT Start Time 1318    PT Stop Time 1359    PT Time Calculation (min) 41 min    Equipment Utilized During Treatment Gait belt    Activity Tolerance Patient tolerated treatment well;No increased pain    Behavior During Therapy WFL for tasks assessed/performed                 Past Medical History:  Diagnosis Date   Arthritis    Chronic systolic heart failure (HCC)    CKD (chronic kidney disease), stage III (HCC)    Dementia (HCC)    Hypertension    Hypothyroidism    Overactive bladder    Past Surgical History:  Procedure Laterality Date   INTRAMEDULLARY (IM) NAIL INTERTROCHANTERIC Left 09/06/2022   Procedure: INTRAMEDULLARY (IM) NAIL INTERTROCHANTERIC;  Surgeon: Tobie Priest, MD;  Location: [facility];  Service: Orthopedics;  Laterality: Left;   PACEMAKER LEADLESS INSERTION N/A 01/07/2021   Procedure: PACEMAKER LEADLESS INSERTION;  Surgeon: Ammon Blunt, MD;  Location: [facility];  Service: Cardiovascular;  Laterality: N/A;   PPM GENERATOR REMOVAL N/A 01/07/2021   Procedure: PPM GENERATOR REMOVAL;  Surgeon: Ammon Blunt, MD;  Location: [facility];  Service: Cardiovascular;  Laterality: N/A;   Patient Active Problem List   Diagnosis Date Noted   AKI (acute kidney injury) 01/12/2024   Acute postoperative anemia due to expected blood loss 09/07/2022   Displaced intertrochanteric fracture of left femur, initial encounter for closed fracture (HCC)  09/05/2022   Orthostatic hypotension 07/12/2022   Diarrhea 07/10/2022   COVID-19 virus infection 07/08/2022   Electrolyte abnormality 07/08/2022   Malnutrition of moderate degree 07/08/2022   Acute delirium 07/08/2022   History of recurrent UTI (urinary tract infection) 01/30/2022   Constipation    UTI (urinary tract infection) 10/15/2021   Elevated brain natriuretic peptide (BNP) level 10/15/2021   Generalized weakness 10/15/2021   Chronic kidney disease, stage 3b (HCC) 10/15/2021   Normal pressure hydrocephalus (HCC) 10/15/2021   Dementia without behavioral disturbance (HCC) 10/15/2021   Hypothyroidism 10/15/2021   Essential hypertension 10/15/2021   Overactive bladder 10/15/2021   Moderate mitral regurgitation 01/13/2021   Status post placement of cardiac pacemaker 01/13/2021   CHB (complete heart block) (HCC) 01/07/2021   Chronic systolic CHF (congestive heart failure) (HCC) 12/17/2020   Postmenopausal osteoporosis 08/26/2016   Mixed Alzheimer's and vascular dementia (HCC) 06/13/2014    ONSET DATE: Progressive decline over ~1-1.5 years  REFERRING DIAG:  M51.369 (ICD-10-CM) - Other intervertebral disc degeneration, lumbar region without mention of lumbar back pain or lower extremity pain  R53.81 (ICD-10-CM) - Other malaise  M62.81 (ICD-10-CM) - Muscle weakness (generalized)    THERAPY DIAG:  Muscle weakness (generalized)  Abnormality of gait and mobility  Debility  Unsteadiness on feet  Other lack of coordination  Difficulty in walking, not elsewhere classified  Rationale for Evaluation and Treatment: Rehabilitation  SUBJECTIVE:  SUBJECTIVE STATEMENT: While at medical appointment to to address RLE pain, x-ray of RLE completed and no fx present; prescribed lidocaine  patches,  believes it's a muscle strain. Pt's caregiver reported that pt complained very little yesterday & this morning of pain, improved with pain relief cream; feels like it's getting better.    Caregiver states pt had accidental BM prior to therapy session and pt had to stand for prolonged period of time during hygiene, which may be contributing to patient's fatigue and presentation during session.  Caregiver states since pt started mood stabilizer, her mood has improved, but her confusion has increased. For example, pt doesn't recall that she has been living in her own home all this time.  Caregiver reports pt has follow-up with Neurologist at beginning of October.     PERTINENT HISTORY:  PMH: hypothyroidism, recurrent UTIs, osteoarthritis, prior anterior trochanter fracture, CKD stage IIIb, chronic systolic heart failure, dementia, hypertension, history of cardiac pacemaker, history of normal pressure hydrocephalus, overreactive bladder   Most recent A&P from Neurology note on 12/21/2023: Mixed dementia (Alzheimer's disease + vascular dementia) with significant ventriculomegaly (with negative lumbar drain trial in 2015) in a patient with history of sepsis secondary to UTI treated with trimethoprim  January 2023. Medication appears to help with UTI and improved mental status - reports good control with current medication Progressive behavioral disturbances with mood swings, outbursts, and apathy, exacerbated during the day. Seroquel (quetiapine) was initiated but caused significant lethargy. Alternative medications, lamotrigine and Depakote, were discussed for her mood-stabilizing properties and different tolerability profiles. - Discuss with primary care provider about reducing Seroquel dose to assess tolerability. Consider 1/2 tablet (12.5 mg) instead.  - If unable to tolerate Seroquel at lower dose, will consider trial of Depakote Valproic Acid is an antiepileptic drug, used for many different  conditions. Patient was advised not to stop medication abruptly, not to use alcohol. Acute side-effects can be abdominal pain, N/V, diarrhea and increased ammonia level. Long term side-effects are hair loss, weight gain, tremors and amenorrhea. Rarely, it can cause low platelets, liver failure and birth defects.  - Consider alternative medications such as lamotrigine or Depakote if Seroquel remains intolerable. - Document alternative medications in the chart for future reference.  Fatigue and somnolence Increased fatigue and somnolence potentially related to Seroquel, hypothyroidism, and medication side effects.  - Monitor heart rate and adjust medications if fatigue persists and heart rate remains low.  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 Symptoms include leg pain, particularly in the right leg, and dragging of the leg during ambulation. Physical therapy has been beneficial. Her symptoms arte chronic. She has had a reportedly negative lower extremity doppler, unavailable for my review.  Right leg pain with inability to pick up feet (right>left - likely from ventriculomegaly + peripheral neuropathy) patient with history of lumbar disc disease with stenosis - reports improvement during recent trial of prednisone. Suspicious for lumbar Disc Disease in addition to decondition Reviewed PCP note: Ongoing leg pain with intermittent weakness. On exam she does have some swelling around the right knee. Unsure if she could have some severe arthritis catching as she walks. Will obtain x-ray of the right knee today. Trial of prednisone 20 mg once a day for 5 days. We did discuss that she has some degenerative lumbar disc disease with stenosis and likely playing a role in right leg weakness. She has a pacemaker and we will defer MRI of the lumbar spine.  Keep follow-up with neurology and if they feel it is  necessary they could possibly pursue nerve conduction study. Inform caretaker to help patient and stay by her side when walking.   - Patient says that her legs feel heavy and that she has difficulty initiating the first step when walking - consistent with magnetic gait in patient with hydrocephalus and brain atrophy - Previous (-) Lumbar Puncture trial at [facility] (01/09/14). - Continue physical therapy to manage symptoms and improve mobility. - strict Emergency Room/return precautions   PAIN:  Are you having pain? No  PRECAUTIONS: Fall and ICD/Pacemaker  WEIGHT BEARING RESTRICTIONS: No  FALLS: Has patient fallen in last 6 months? No  LIVING ENVIRONMENT: Lives with: caregiver 7 days a week. Alone for an hour or 2 a day, but only when alseep.   Lives in: House/apartment Stairs: Yes: Internal: 1 but ramp in place steps; none and External: 1 steps; none Has following equipment at home: Walker - 2 wheeled; transport chair; shower chair and grab bars  PLOF: Needs assistance with ADLs, Needs assistance with gait, Needs assistance with transfers, and Needs assistance with ambulation using RW - Primarily uses transport chair for mobility, but will ambulate with assistance to/from bathroom using RW in the home  PATIENT GOALS: Help her walking be easier and help her get stronger   OBJECTIVE:  Note: Objective measures were completed at Evaluation unless otherwise noted.  COGNITION: Overall cognitive status: History of cognitive impairments - at baseline   SENSATION: Light touch: WFL on screen, but pt with hx of neuropathy in B LEs  POSTURE: rounded shoulders, forward head, increased thoracic kyphosis, posterior pelvic tilt, and flexed trunk    LOWER EXTREMITY ROM:     Active  Right Eval Left Eval  Hip flexion Limited AROM by weakness Limited AROM by weakness (more impaired than R)  Hip extension Decreased ROM Decreased ROM  Knee flexion [facility] WFL  Knee extension [facility]  Ankle  dorsiflexion [facility] WFL  Ankle plantarflexion [facility] WFL  Ankle inversion    Ankle eversion     (Blank rows = not tested)  LOWER EXTREMITY MMT:    MMT Right Eval Left Eval  Hip flexion 3+ 3-  Knee flexion 4- 4-  Knee extension 4- 4-  Ankle dorsiflexion 4- 3-  Ankle plantarflexion 3+ 4-  (Blank rows = not tested)  BED MOBILITY:  Findings: Sit to supine Min A for B LE management onto mat Supine to sit Min A for trunk upright with continued impaired motor plan Pt doesn't recall previously trained logroll technique to increase her independence, could be due to impaired carryover of technique in the clinic space Caregiver reports bed mobility has improved slightly at home  Pt with significant difficulty performing anterior scooting towards EOM requiring mod A for this  TRANSFERS: Sit to stand: Min A  Assistive device utilized: Environmental consultant - 2 wheeled     Stand to sit: Min A  Assistive device utilized: Environmental consultant - 2 wheeled     Chair to chair: Min A  Assistive device utilized: Environmental consultant - 2 wheeled      *continues to require cuing to turn fully prior to initiating sitting  GAIT: Findings: Gait Characteristics: step to pattern, step through pattern, decreased step length- Right, decreased step length- Left, decreased stride length, decreased hip/knee flexion- Right, decreased hip/knee flexion- Left, decreased ankle dorsiflexion- Right, decreased ankle dorsiflexion- Left, trunk flexed, narrow BOS, poor foot clearance- Right, and poor foot clearance- Left, Distance walked: ~16ft  x2, Assistive device utilized:Walker - 2 wheeled, Level of assistance: Min A, and Comments: severely decreased gait speed  FUNCTIONAL TESTS:  5 times sit to stand: 83 seconds Timed up and go (TUG):  53min54 seconds using RW with min A 10 meter walk test: needs to be assessed  PATIENT SURVEYS:  PsFS: average: 3.67  Patient will completely turn 90degrees prior to sitting down during transfers with minimal cuing: 3  Patient will  be able to maintain standing balance using B UE support on RW with only SBA while aide provides total A for LB clothing or ADLs: 5 Patient will stand with erect/upright posture when walking: 3                                                                                                                             TREATMENT DATE: 07/04/2024  Pt arrives to therapy in her personal transport chair. Caregiver, [name], present.  Throughout session, pt completing STS from personal transport chair with min A & verbal cueing for hand placement for push off.   Gait in // for BUE support, 1x down & back + 1x 2 laps down/back (~10' each way, total ~30')  Pain in R thigh area causing some lateral lean to L side in order to offload R LE, decreased step length w/ LLE; decreased lean compared to previous session, able to increase step length with max verbal cueing Max verbal cueing for increased upright posture When returning to sit in transport chair, pt requiring max verbal cueing for completing full turn prior to sitting, requiring mod A to guide hips to surface d/t lack of full turn  Side stepping down/back in // bars with B UE support:  2 laps + 1 lap Providing external target of therapist's foot to increase lateral step length; decreased step length when stepping with LLE despite max verbal cueing & external target Noticed increased forward trunk flexed/hip hinged posture with difficulty improving upright posture despite cuing to increase hip extension Noticed pt keeps pelvis rotated towards L throughout Did notice pt using B UEs to off-weight R LE a little during gait in // bars At end of each set, pt demonstrating difficulty turning to sit at end of gait in // bars, but improves with increased experience and cuing  Standing in // bars participating in basketball toss with +2 providing external target to promote increased upright posture Pt favoring R LE with limited WBing through it Pt requiring at  least 1 UE support to offload RLE  Throughout session, pt with no explicit complaints of pain in RLE, but offloading RLE, decreasing step length consistent with previous reports of soreness; pt reporting that she overall is feeling more fatigued, likely secondary to accidental BM per caregiver.   Pt left session in personal transport chair.       PATIENT EDUCATION: Education details: Pt educated throughout session about proper posture and technique with exercises. Improved exercise technique, movement at target joints,  use of target muscles after min to mod verbal, visual, tactile cues.  Person educated: Patient and Caregiver [name] Education method: Explanation Education comprehension: verbalized understanding and needs further education  HOME EXERCISE PROGRAM:  Access Code: [email or web address] URL: [email or web address] Date: 12/20/2023 Prepared by: Connell Kiss   Exercises - Sit to Stand with Armchair  - 1 x daily - 7 x weekly - 3 sets - 5 reps - Standing March with Counter Support  - 1 x daily - 7 x weekly - 2 sets - 10 reps - Supine Bridge  - 1 x daily - 7 x weekly - 2 sets - 10 reps - Supine Heel Slide with Strap  - 1 x daily - 7 x weekly - 2 sets - 10 reps - Supine Hip Abduction AROM  - 1 x daily - 7 x weekly - 3 sets - 10 reps - Seated Long Arc Quad  - 1 x daily - 7 x weekly - 3 sets - 10 reps - Seated March  - 1 x daily - 7 x weekly - 3 sets - 10 reps - 2 hold - Seated Hip Abduction with Resistance  - 1 x daily - 7 x weekly - 2 sets - 10 reps   GOALS: Goals reviewed with patient? Yes  SHORT TERM GOALS: Target date: 07/11/2024   Patient will be independent in home exercise program to improve strength/mobility for better functional independence with ADLs.  Baseline: need to initiate 05/14/2024: Caregiver states engaging patient in this as much as possible Goal status: MET   LONG TERM GOALS: Target date: 08/22/2024  Patient will perform bed mobility with consistently  no more than min A utilizing bed features/supports as needed and with no more than than min cuing from aide/caregiver.  Baseline: requires up to mod A and mod/max verbal cuing 05/14/2024: still requiring mod A and mod cuing with caregiver reporting pt having increased leg pain in the morning impacting progress with this 05/30/2024: still requiring mod A and mod cuing, pt continues to have R LE pain in the mornings impacting independence with this activity 06/25/2024: still requiring mod A and mod cuing, caregiver reports pt's independence with this is impacted by fatigue at night and R LE pain in AM Goal status: IN PROGRESS  2.  Patient will increase PsFS score to equal to or greater than and average of 2 points to demonstrate statistically significant improvement in mobility and quality of life.  Baseline: 3.667 05/14/2024: 4.66 05/30/2024:  5.33 06/25/2024: 6 Goal status: IN PROGRESS  3.  Patient (> 22 years old) will complete five times sit to stand test in < 30 seconds indicating an increased LE strength and improved balance.  Baseline: 83 seconds from her transport chair with RW in front of her and min progressed to light mod A  05/14/2024: 43.34 seconds from her transport chair with RW in front of her and min progressed to light mod A  05/30/2024: 36.93 seconds from transport chair to RW with min progressed to light mod A (on 2nd trial) 06/25/2024: 42.25 seconds using RW from her personal transport chair - only skilled min A for lifting to stand throughout! Limited by R thigh pain Goal status: IN PROGRESS   4.  Patient will reduce timed up and go to <60 sec seconds to reduce fall risk and demonstrate improved transfer/gait ability.  Baseline: 34min54 seconds using RW with min A  05/14/2024: 77min57 seconds using RW with min A  05/30/2024:  2 min 2 seconds  using RW with skilled min A for lifting to stand and balance/AD management during gait 06/25/2024: 2 min and 3 seconds using RW with skilled light min  A for steadying - only verbal cuing for AD management Goal status: IN PROGRESS  5.  Patient will increase 10 meter walk test by 0.62m/s as to improve gait speed for better household level ambulation and to reduce fall risk.  Baseline: 0.053m/s with RW  05/14/2024: 0.085 m/s using RW with min A 05/30/2024: 0.107 m/s using RW with skilled min A  06/25/2024:  0.0868 m/s using RW with skilled min A for steadying - speed impacted by R LE thigh pain Goal status: IN PROGRESS   ASSESSMENT:  CLINICAL IMPRESSION:     Pt not verbally expressing pain this date, but showing nonverbal signs of discomfort as evidenced by decreased step length & leaning away from or offloading RLE. Pt  requiring consistent verbal curing for increased upright posture with activities, especially with increased fatigue. Pt benefits from both verbal cueing & having external targets as appropriate. In last 2 sessions, pt with decline in functional mobility, but undetermined if secondary to RLE pain or other cause. Ms. Lanigan will benefit from further skilled PT to improve these deficits in order to increase QOL and ease/safety with ADLs.   OBJECTIVE IMPAIRMENTS: Abnormal gait, decreased activity tolerance, decreased balance, decreased cognition, decreased coordination, decreased endurance, decreased knowledge of condition, decreased knowledge of use of DME, decreased mobility, difficulty walking, decreased ROM, decreased strength, impaired flexibility, impaired sensation, impaired UE functional use, and pain.   ACTIVITY LIMITATIONS: carrying, lifting, sitting, standing, transfers, bed mobility, continence, bathing, toileting, dressing, self feeding, reach over head, hygiene/grooming, and locomotion level  PARTICIPATION LIMITATIONS: meal prep, cleaning, laundry, shopping, and community activity  PERSONAL FACTORS: Age, Time since onset of injury/illness/exacerbation, and 3+ comorbidities: hypothyroidism, recurrent UTIs, osteoarthritis,  prior anterior trochanter fracture, CKD stage IIIb, chronic systolic heart failure, dementia, hypertension, history of cardiac pacemaker, history of normal pressure hydrocephalus, overreactive bladder are also affecting patient's functional outcome.   REHAB POTENTIAL: Good  CLINICAL DECISION MAKING: Evolving/moderate complexity  EVALUATION COMPLEXITY: Moderate  PLAN:  PT FREQUENCY: 1-2x/week  PT DURATION: 12 weeks  PLANNED INTERVENTIONS: 97164- PT Re-evaluation, 97750- Physical Performance Testing, 97110-Therapeutic exercises, 97530- Therapeutic activity, 97112- Neuromuscular re-education, 97535- Self Care, [id number]- Manual therapy, [phone]- Gait training, [phone]- Canalith repositioning, Patient/Family education, Balance training, Stair training, Joint mobilization, Vestibular training, Visual/preceptual remediation/compensation, Cognitive remediation, DME instructions, Cryotherapy, Moist heat, and Biofeedback  PLAN FOR NEXT SESSIONS:   *continue tub transfer training 1 additional session *gait training in small spaces where RW will not fit - continue gait training and functional B LE strengthening in // bars as tolerated w/ RLE pain - continue lateral and forward/backwards stepping over obstacles (progress height as able)  - progress to lateral foot taps on step? Or step-ups? - standing marches/foot taps/hip flexion for improved foot clearance with AWs - sit<>stands  - try weighted vest? - standing reaching overhead/upright for improved hip extension and upright posture - gait training using RW    Chiquita Silvan, SPT Physical Therapy Student - [place]  [facility] 5:01 PM 07/04/24

## 2024-07-09 ENCOUNTER — Ambulatory Visit

## 2024-07-09 DIAGNOSIS — R5381 Other malaise: Secondary | ICD-10-CM

## 2024-07-09 DIAGNOSIS — R262 Difficulty in walking, not elsewhere classified: Secondary | ICD-10-CM

## 2024-07-09 DIAGNOSIS — M6281 Muscle weakness (generalized): Secondary | ICD-10-CM

## 2024-07-09 DIAGNOSIS — R269 Unspecified abnormalities of gait and mobility: Secondary | ICD-10-CM

## 2024-07-09 DIAGNOSIS — R2681 Unsteadiness on feet: Secondary | ICD-10-CM

## 2024-07-09 NOTE — Therapy (Deleted)
 OUTPATIENT PHYSICAL THERAPY TREATMENT   Patient Name: Kerri [name] MRN: [id number] DOB:[birth year], 89 y.o., female Today's Date: 07/09/2024   PCP: Cyrus Selinda Moose, PA-C  REFERRING PROVIDER: Cyrus Selinda Moose, PA-C   END OF SESSION:            Past Medical History:  Diagnosis Date   Arthritis    Chronic systolic heart failure (HCC)    CKD (chronic kidney disease), stage III (HCC)    Dementia (HCC)    Hypertension    Hypothyroidism    Overactive bladder    Past Surgical History:  Procedure Laterality Date   INTRAMEDULLARY (IM) NAIL INTERTROCHANTERIC Left 09/06/2022   Procedure: INTRAMEDULLARY (IM) NAIL INTERTROCHANTERIC;  Surgeon: Tobie Priest, MD;  Location: [facility] OR[facility];  Service: Orthopedics;  Laterality: Left;   PACEMAKER LEADLESS INSERTION N/A 01/07/2021   Procedure: PACEMAKER LEADLESS INSERTION;  Surgeon: Ammon Blunt, MD;  Location: [facility];  Service: Cardiovascular;  Laterality: N/A;   PPM GENERATOR REMOVAL N/A 01/07/2021   Procedure: PPM GENERATOR REMOVAL;  Surgeon: Ammon Blunt, MD;  Location: [facility];  Service: Cardiovascular;  Laterality: N/A;   Patient Active Problem List   Diagnosis Date Noted   AKI (acute kidney injury) 01/12/2024   Acute postoperative anemia due to expected blood loss 09/07/2022   Displaced intertrochanteric fracture of left femur, initial encounter for closed fracture (HCC) 09/05/2022   Orthostatic hypotension 07/12/2022   Diarrhea 07/10/2022   COVID-19 virus infection 07/08/2022   Electrolyte abnormality 07/08/2022   Malnutrition of moderate degree 07/08/2022   Acute delirium 07/08/2022   History of recurrent UTI (urinary tract infection) 01/30/2022   Constipation    UTI (urinary tract infection) 10/15/2021   Elevated brain natriuretic peptide (BNP) level 10/15/2021   Generalized weakness 10/15/2021   Chronic kidney disease, stage 3b (HCC) 10/15/2021   Normal pressure  hydrocephalus (HCC) 10/15/2021   Dementia without behavioral disturbance (HCC) 10/15/2021   Hypothyroidism 10/15/2021   Essential hypertension 10/15/2021   Overactive bladder 10/15/2021   Moderate mitral regurgitation 01/13/2021   Status post placement of cardiac pacemaker 01/13/2021   CHB (complete heart block) (HCC) 01/07/2021   Chronic systolic CHF (congestive heart failure) (HCC) 12/17/2020   Postmenopausal osteoporosis 08/26/2016   Mixed Alzheimer's and vascular dementia (HCC) 06/13/2014    ONSET DATE: Progressive decline over ~1-1.5 years  REFERRING DIAG:  M51.369 (ICD-10-CM) - Other intervertebral disc degeneration, lumbar region without mention of lumbar back pain or lower extremity pain  R53.81 (ICD-10-CM) - Other malaise  M62.81 (ICD-10-CM) - Muscle weakness (generalized)    THERAPY DIAG:  Muscle weakness (generalized)  Abnormality of gait and mobility  Unsteadiness on feet  Difficulty in walking, not elsewhere classified  Debility  Rationale for Evaluation and Treatment: Rehabilitation  SUBJECTIVE:  SUBJECTIVE STATEMENT: While at medical appointment to to address RLE pain, x-ray of RLE completed and no fx present; prescribed lidocaine  patches, believes it's a muscle strain. Pt's caregiver reported that pt complained very little yesterday & this morning of pain, improved with pain relief cream; feels like it's getting better.    Caregiver states pt had accidental BM prior to therapy session and pt had to stand for prolonged period of time during hygiene, which may be contributing to patient's fatigue and presentation during session.  Caregiver states since pt started mood stabilizer, her mood has improved, but her confusion has increased. For example, pt doesn't recall that she has  been living in her own home all this time.  Caregiver reports pt has follow-up with Neurologist at beginning of October.     PERTINENT HISTORY:  PMH: hypothyroidism, recurrent UTIs, osteoarthritis, prior anterior trochanter fracture, CKD stage IIIb, chronic systolic heart failure, dementia, hypertension, history of cardiac pacemaker, history of normal pressure hydrocephalus, overreactive bladder   Most recent A&P from Neurology note on 12/21/2023: Mixed dementia (Alzheimer's disease + vascular dementia) with significant ventriculomegaly (with negative lumbar drain trial in 2015) in a patient with history of sepsis secondary to UTI treated with trimethoprim  January 2023. Medication appears to help with UTI and improved mental status - reports good control with current medication Progressive behavioral disturbances with mood swings, outbursts, and apathy, exacerbated during the day. Seroquel (quetiapine) was initiated but caused significant lethargy. Alternative medications, lamotrigine and Depakote, were discussed for her mood-stabilizing properties and different tolerability profiles. - Discuss with primary care provider about reducing Seroquel dose to assess tolerability. Consider 1/2 tablet (12.5 mg) instead.  - If unable to tolerate Seroquel at lower dose, will consider trial of Depakote Valproic Acid is an antiepileptic drug, used for many different conditions. Patient was advised not to stop medication abruptly, not to use alcohol. Acute side-effects can be abdominal pain, N/V, diarrhea and increased ammonia level. Long term side-effects are hair loss, weight gain, tremors and amenorrhea. Rarely, it can cause low platelets, liver failure and birth defects.  - Consider alternative medications such as lamotrigine or Depakote if Seroquel remains intolerable. - Document alternative medications in the chart for future reference.  Fatigue and somnolence Increased fatigue and somnolence  potentially related to Seroquel, hypothyroidism, and medication side effects.  - Monitor heart rate and adjust medications if fatigue persists and heart rate remains low.  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 Symptoms include leg pain, particularly in the right leg, and dragging of the leg during ambulation. Physical therapy has been beneficial. Her symptoms arte chronic. She has had a reportedly negative lower extremity doppler, unavailable for my review.  Right leg pain with inability to pick up feet (right>left - likely from ventriculomegaly + peripheral neuropathy) patient with history of lumbar disc disease with stenosis - reports improvement during recent trial of prednisone. Suspicious for lumbar Disc Disease in addition to decondition Reviewed PCP note: Ongoing leg pain with intermittent weakness. On exam she does have some swelling around the right knee. Unsure if she could have some severe arthritis catching as she walks. Will obtain x-ray of the right knee today. Trial of prednisone 20 mg once a day for 5 days. We did discuss that she has some degenerative lumbar disc disease with stenosis and likely playing a role in right leg weakness. She has a pacemaker and we will defer MRI of the lumbar spine.  Keep follow-up with neurology and if they feel it is necessary they could possibly pursue nerve conduction study. Inform caretaker to help patient and stay by her side when walking.   - Patient says that her legs feel heavy and that she has difficulty initiating the first step when walking - consistent with magnetic gait in patient with hydrocephalus and brain atrophy - Previous (-) Lumbar Puncture trial at [facility] (01/09/14). - Continue physical therapy to manage symptoms and improve mobility. - strict Emergency Room/return precautions   PAIN:  Are you having pain? No  PRECAUTIONS:  Fall and ICD/Pacemaker  WEIGHT BEARING RESTRICTIONS: No  FALLS: Has patient fallen in last 6 months? No  LIVING ENVIRONMENT: Lives with: caregiver 7 days a week. Alone for an hour or 2 a day, but only when alseep.   Lives in: House/apartment Stairs: Yes: Internal: 1 but ramp in place steps; none and External: 1 steps; none Has following equipment at home: Walker - 2 wheeled; transport chair; shower chair and grab bars  PLOF: Needs assistance with ADLs, Needs assistance with gait, Needs assistance with transfers, and Needs assistance with ambulation using RW - Primarily uses transport chair for mobility, but will ambulate with assistance to/from bathroom using RW in the home  PATIENT GOALS: Help her walking be easier and help her get stronger   OBJECTIVE:  Note: Objective measures were completed at Evaluation unless otherwise noted.  COGNITION: Overall cognitive status: History of cognitive impairments - at baseline   SENSATION: Light touch: WFL on screen, but pt with hx of neuropathy in B LEs  POSTURE: rounded shoulders, forward head, increased thoracic kyphosis, posterior pelvic tilt, and flexed trunk    LOWER EXTREMITY ROM:     Active  Right Eval Left Eval  Hip flexion Limited AROM by weakness Limited AROM by weakness (more impaired than R)  Hip extension Decreased ROM Decreased ROM  Knee flexion [facility] WFL  Knee extension [facility]  Ankle dorsiflexion [facility] WFL  Ankle plantarflexion [facility] WFL  Ankle inversion    Ankle eversion     (Blank rows = not tested)  LOWER EXTREMITY MMT:    MMT Right Eval Left Eval  Hip flexion 3+ 3-  Knee flexion 4- 4-  Knee extension 4- 4-  Ankle dorsiflexion 4- 3-  Ankle plantarflexion 3+ 4-  (Blank rows = not tested)  BED MOBILITY:  Findings: Sit to supine Min A for B LE management onto mat Supine to sit Min A for trunk upright with continued impaired motor plan Pt doesn't recall previously trained logroll technique to increase her  independence, could be due to impaired carryover of technique in the clinic space Caregiver reports bed mobility has improved slightly at home  Pt with significant difficulty performing anterior scooting towards EOM requiring mod A for this  TRANSFERS: Sit to stand: Min A  Assistive device utilized: Environmental consultant - 2 wheeled     Stand to sit: Min A  Assistive device utilized: Environmental consultant - 2 wheeled     Chair to chair: Min A  Assistive device utilized: Environmental consultant - 2 wheeled      *continues to require cuing to turn fully prior to initiating sitting  GAIT: Findings: Gait Characteristics: step to pattern, step through pattern, decreased step length- Right, decreased step length- Left, decreased stride length, decreased hip/knee flexion- Right, decreased hip/knee flexion- Left, decreased ankle dorsiflexion- Right, decreased ankle dorsiflexion- Left, trunk flexed, narrow BOS, poor foot clearance- Right, and poor foot clearance- Left, Distance walked: ~36ft  x2, Assistive device utilized:Walker - 2 wheeled, Level of assistance: Min A, and Comments: severely decreased gait speed  FUNCTIONAL TESTS:  5 times sit to stand: 83 seconds Timed up and go (TUG):  45min54 seconds using RW with min A 10 meter walk test: needs to be assessed  PATIENT SURVEYS:  PsFS: average: 3.67  Patient will completely turn 90degrees prior to sitting down during transfers with minimal cuing: 3  Patient will be able to maintain standing balance using B UE support on RW with only SBA while aide provides total A for LB clothing or ADLs: 5 Patient will stand with erect/upright posture when walking: 3                                                                                                                             TREATMENT DATE: 07/09/2024  Pt arrives to therapy in her personal transport chair. Caregiver, [name], present.  Throughout session, pt completing STS from personal transport chair with min A & verbal cueing for hand placement  for push off.   Gait in // for BUE support, 1x down & back + 1x 2 laps down/back (~10' each way, total ~30')  Pain in R thigh area causing some lateral lean to L side in order to offload R LE, decreased step length w/ LLE; decreased lean compared to previous session, able to increase step length with max verbal cueing Max verbal cueing for increased upright posture When returning to sit in transport chair, pt requiring max verbal cueing for completing full turn prior to sitting, requiring mod A to guide hips to surface d/t lack of full turn  Side stepping down/back in // bars with B UE support:  2 laps + 1 lap Providing external target of therapist's foot to increase lateral step length; decreased step length when stepping with LLE despite max verbal cueing & external target Noticed increased forward trunk flexed/hip hinged posture with difficulty improving upright posture despite cuing to increase hip extension Noticed pt keeps pelvis rotated towards L throughout Did notice pt using B UEs to off-weight R LE a little during gait in // bars At end of each set, pt demonstrating difficulty turning to sit at end of gait in // bars, but improves with increased experience and cuing  Standing in // bars participating in basketball toss with +2 providing external target to promote increased upright posture Pt favoring R LE with limited WBing through it Pt requiring at least 1 UE support to offload RLE  Throughout session, pt with no explicit complaints of pain in RLE, but offloading RLE, decreasing step length consistent with previous reports of soreness; pt reporting that she overall is feeling more fatigued, likely secondary to accidental BM per caregiver.   Pt left session in personal transport chair.       PATIENT EDUCATION: Education details: Pt educated throughout session about proper posture and technique with exercises. Improved exercise technique, movement at target joints, use  of target  muscles after min to mod verbal, visual, tactile cues.  Person educated: Patient and Caregiver [name] Education method: Explanation Education comprehension: verbalized understanding and needs further education  HOME EXERCISE PROGRAM:  Access Code: [email or web address] URL: [email or web address] Date: 12/20/2023 Prepared by: Connell Kiss   Exercises - Sit to Stand with Armchair  - 1 x daily - 7 x weekly - 3 sets - 5 reps - Standing March with Counter Support  - 1 x daily - 7 x weekly - 2 sets - 10 reps - Supine Bridge  - 1 x daily - 7 x weekly - 2 sets - 10 reps - Supine Heel Slide with Strap  - 1 x daily - 7 x weekly - 2 sets - 10 reps - Supine Hip Abduction AROM  - 1 x daily - 7 x weekly - 3 sets - 10 reps - Seated Long Arc Quad  - 1 x daily - 7 x weekly - 3 sets - 10 reps - Seated March  - 1 x daily - 7 x weekly - 3 sets - 10 reps - 2 hold - Seated Hip Abduction with Resistance  - 1 x daily - 7 x weekly - 2 sets - 10 reps   GOALS: Goals reviewed with patient? Yes  SHORT TERM GOALS: Target date: 07/11/2024   Patient will be independent in home exercise program to improve strength/mobility for better functional independence with ADLs.  Baseline: need to initiate 05/14/2024: Caregiver states engaging patient in this as much as possible Goal status: MET   LONG TERM GOALS: Target date: 08/22/2024  Patient will perform bed mobility with consistently no more than min A utilizing bed features/supports as needed and with no more than than min cuing from aide/caregiver.  Baseline: requires up to mod A and mod/max verbal cuing 05/14/2024: still requiring mod A and mod cuing with caregiver reporting pt having increased leg pain in the morning impacting progress with this 05/30/2024: still requiring mod A and mod cuing, pt continues to have R LE pain in the mornings impacting independence with this activity 06/25/2024: still requiring mod A and mod cuing, caregiver reports pt's independence  with this is impacted by fatigue at night and R LE pain in AM Goal status: IN PROGRESS  2.  Patient will increase PsFS score to equal to or greater than and average of 2 points to demonstrate statistically significant improvement in mobility and quality of life.  Baseline: 3.667 05/14/2024: 4.66 05/30/2024:  5.33 06/25/2024: 6 Goal status: IN PROGRESS  3.  Patient (> 80 years old) will complete five times sit to stand test in < 30 seconds indicating an increased LE strength and improved balance.  Baseline: 83 seconds from her transport chair with RW in front of her and min progressed to light mod A  05/14/2024: 43.34 seconds from her transport chair with RW in front of her and min progressed to light mod A  05/30/2024: 36.93 seconds from transport chair to RW with min progressed to light mod A (on 2nd trial) 06/25/2024: 42.25 seconds using RW from her personal transport chair - only skilled min A for lifting to stand throughout! Limited by R thigh pain Goal status: IN PROGRESS   4.  Patient will reduce timed up and go to <60 sec seconds to reduce fall risk and demonstrate improved transfer/gait ability.  Baseline: 44min54 seconds using RW with min A  05/14/2024: 90min57 seconds using RW with min A  05/30/2024:  2 min 2 seconds  using RW with skilled min A for lifting to stand and balance/AD management during gait 06/25/2024: 2 min and 3 seconds using RW with skilled light min A for steadying - only verbal cuing for AD management Goal status: IN PROGRESS  5.  Patient will increase 10 meter walk test by 0.18m/s as to improve gait speed for better household level ambulation and to reduce fall risk.  Baseline: 0.076m/s with RW  05/14/2024: 0.085 m/s using RW with min A 05/30/2024: 0.107 m/s using RW with skilled min A  06/25/2024:  0.0868 m/s using RW with skilled min A for steadying - speed impacted by R LE thigh pain Goal status: IN PROGRESS   ASSESSMENT:  CLINICAL IMPRESSION:     Pt not verbally  expressing pain this date, but showing nonverbal signs of discomfort as evidenced by decreased step length & leaning away from or offloading RLE. Pt  requiring consistent verbal curing for increased upright posture with activities, especially with increased fatigue. Pt benefits from both verbal cueing & having external targets as appropriate. In last 2 sessions, pt with decline in functional mobility, but undetermined if secondary to RLE pain or other cause. Ms. Lecker will benefit from further skilled PT to improve these deficits in order to increase QOL and ease/safety with ADLs.   OBJECTIVE IMPAIRMENTS: Abnormal gait, decreased activity tolerance, decreased balance, decreased cognition, decreased coordination, decreased endurance, decreased knowledge of condition, decreased knowledge of use of DME, decreased mobility, difficulty walking, decreased ROM, decreased strength, impaired flexibility, impaired sensation, impaired UE functional use, and pain.   ACTIVITY LIMITATIONS: carrying, lifting, sitting, standing, transfers, bed mobility, continence, bathing, toileting, dressing, self feeding, reach over head, hygiene/grooming, and locomotion level  PARTICIPATION LIMITATIONS: meal prep, cleaning, laundry, shopping, and community activity  PERSONAL FACTORS: Age, Time since onset of injury/illness/exacerbation, and 3+ comorbidities: hypothyroidism, recurrent UTIs, osteoarthritis, prior anterior trochanter fracture, CKD stage IIIb, chronic systolic heart failure, dementia, hypertension, history of cardiac pacemaker, history of normal pressure hydrocephalus, overreactive bladder are also affecting patient's functional outcome.   REHAB POTENTIAL: Good  CLINICAL DECISION MAKING: Evolving/moderate complexity  EVALUATION COMPLEXITY: Moderate  PLAN:  PT FREQUENCY: 1-2x/week  PT DURATION: 12 weeks  PLANNED INTERVENTIONS: 97164- PT Re-evaluation, 97750- Physical Performance Testing, 97110-Therapeutic  exercises, 97530- Therapeutic activity, 97112- Neuromuscular re-education, 97535- Self Care, [id number]- Manual therapy, [phone]- Gait training, [phone]- Canalith repositioning, Patient/Family education, Balance training, Stair training, Joint mobilization, Vestibular training, Visual/preceptual remediation/compensation, Cognitive remediation, DME instructions, Cryotherapy, Moist heat, and Biofeedback  PLAN FOR NEXT SESSIONS:   *continue tub transfer training 1 additional session *gait training in small spaces where RW will not fit - continue gait training and functional B LE strengthening in // bars as tolerated w/ RLE pain - continue lateral and forward/backwards stepping over obstacles (progress height as able)  - progress to lateral foot taps on step? Or step-ups? - standing marches/foot taps/hip flexion for improved foot clearance with AWs - sit<>stands  - try weighted vest? - standing reaching overhead/upright for improved hip extension and upright posture - gait training using RW    Note: Portions of this document were prepared using Dragon voice recognition software and although reviewed may contain unintentional dictation errors in syntax, grammar, or spelling.  Lonni KATHEE Gainer PT ,DPT Physical Therapist- [place]  [facility] [place]  7:57 AM 07/09/24

## 2024-07-09 NOTE — Therapy (Addendum)
 OUTPATIENT PHYSICAL THERAPY TREATMENT   Patient Name: Kerri [name] MRN: [id number] DOB:11/19/1930, 89 y.o., female Today's Date: 07/09/2024   PCP: Cyrus Selinda Moose, PA-C  REFERRING PROVIDER: Cyrus Selinda Moose, PA-C   END OF SESSION:   PT End of Session - 07/09/24 1320     Visit Number 23    Number of Visits 48    Date for Recertification  08/22/24    Authorization Type [employer] [id number]    Authorization Time Period 24 PT vsts from 7/21-10/13    Authorization - Visit Number 15    Authorization - Number of Visits 24    Progress Note Due on Visit 20    PT Start Time 1316    PT Stop Time 1356    PT Time Calculation (min) 40 min    Equipment Utilized During Treatment Gait belt    Activity Tolerance Patient tolerated treatment well;No increased pain    Behavior During Therapy WFL for tasks assessed/performed            Past Medical History:  Diagnosis Date   Arthritis    Chronic systolic heart failure (HCC)    CKD (chronic kidney disease), stage III (HCC)    Dementia (HCC)    Hypertension    Hypothyroidism    Overactive bladder    Past Surgical History:  Procedure Laterality Date   INTRAMEDULLARY (IM) NAIL INTERTROCHANTERIC Left 09/06/2022   Procedure: INTRAMEDULLARY (IM) NAIL INTERTROCHANTERIC;  Surgeon: Tobie Priest, MD;  Location: [facility] OR[facility];  Service: Orthopedics;  Laterality: Left;   PACEMAKER LEADLESS INSERTION N/A 01/07/2021   Procedure: PACEMAKER LEADLESS INSERTION;  Surgeon: Ammon Blunt, MD;  Location: [facility];  Service: Cardiovascular;  Laterality: N/A;   PPM GENERATOR REMOVAL N/A 01/07/2021   Procedure: PPM GENERATOR REMOVAL;  Surgeon: Ammon Blunt, MD;  Location: [facility];  Service: Cardiovascular;  Laterality: N/A;   Patient Active Problem List   Diagnosis Date Noted   AKI (acute kidney injury) 01/12/2024   Acute postoperative anemia due to expected blood loss 09/07/2022   Displaced intertrochanteric  fracture of left femur, initial encounter for closed fracture (HCC) 09/05/2022   Orthostatic hypotension 07/12/2022   Diarrhea 07/10/2022   COVID-19 virus infection 07/08/2022   Electrolyte abnormality 07/08/2022   Malnutrition of moderate degree 07/08/2022   Acute delirium 07/08/2022   History of recurrent UTI (urinary tract infection) 01/30/2022   Constipation    UTI (urinary tract infection) 10/15/2021   Elevated brain natriuretic peptide (BNP) level 10/15/2021   Generalized weakness 10/15/2021   Chronic kidney disease, stage 3b (HCC) 10/15/2021   Normal pressure hydrocephalus (HCC) 10/15/2021   Dementia without behavioral disturbance (HCC) 10/15/2021   Hypothyroidism 10/15/2021   Essential hypertension 10/15/2021   Overactive bladder 10/15/2021   Moderate mitral regurgitation 01/13/2021   Status post placement of cardiac pacemaker 01/13/2021   CHB (complete heart block) (HCC) 01/07/2021   Chronic systolic CHF (congestive heart failure) (HCC) 12/17/2020   Postmenopausal osteoporosis 08/26/2016   Mixed Alzheimer's and vascular dementia (HCC) 06/13/2014    ONSET DATE: Progressive decline over ~1-1.5 years  REFERRING DIAG:  M51.369 (ICD-10-CM) - Other intervertebral disc degeneration, lumbar region without mention of lumbar back pain or lower extremity pain  R53.81 (ICD-10-CM) - Other malaise  M62.81 (ICD-10-CM) - Muscle weakness (generalized)    THERAPY DIAG:  Muscle weakness (generalized)  Abnormality of gait and mobility  Unsteadiness on feet  Difficulty in walking, not elsewhere classified  Debility  Rationale for Evaluation and Treatment:  Rehabilitation  SUBJECTIVE:                                                                                                                                                                                             SUBJECTIVE STATEMENT: Pt pleasant, calm upon arrival, offers no new complaint or updates. Pt's caregiver offers  no medical updates since prior visit.   PERTINENT HISTORY:  Kerri [name] is a 89yoF who is referred back to [facility] for ongoing problems of fatigue, unsteadiness, weakness of legs, chronic Rt knee DJD. Neurology note in 5/28 reports pt has severe sensory motor polyneuropathy, RLE pain with inability to pick up feet (right>left - likely from ventriculomegaly + peripheral neuropathy),  We did discuss that she has some degenerative lumbar disc disease with stenosis and likely playing a role in right leg weakness. She has a pacemaker and we will defer MRI of the lumbar spine, Mixed dementia (Alzheimer's disease + vascular dementia) with significant ventriculomegaly (with negative lumbar drain trial in 2015) in a patient with history of sepsis secondary to UTI treated with trimethoprim  January 2023. Medication appears to help with UTI and improved mental status. Lumbar CT from Jan 2024 includes Unchanged lumbar spondylosis notable for moderate narrowing of the right lateral recess at L1-2 and L2-3, as well as mild spinal canal stenosis at L2-3 and L3-4.PMH: hypothyroidism, recurrent UTIs, osteoarthritis, prior anterior trochanter fracture, CKD stage IIIb, chronic systolic heart failure, dementia, hypertension, history of cardiac pacemaker, history of normal pressure hydrocephalus, overreactive bladder   PAIN:  Are you having pain? No  PRECAUTIONS: Fall and ICD/Pacemaker  WEIGHT BEARING RESTRICTIONS: No  FALLS: Has patient fallen in last 6 months? No  LIVING ENVIRONMENT: Lives with: caregiver 7 days a week. Alone for an hour or 2 a day, but only when alseep.   Lives in: House/apartment Stairs: Yes: Internal: 1 but ramp in place steps; none and External: 1 steps; none Has following equipment at home: Walker - 2 wheeled; transport chair; shower chair and grab bars  PLOF: Needs assistance with ADLs, Needs assistance with gait, Needs assistance with transfers, and Needs assistance with ambulation  using RW - Primarily uses transport chair for mobility, but will ambulate with assistance to/from bathroom using RW in the home  PATIENT GOALS: Help her walking be easier and help her get stronger   OBJECTIVE:  Note: Objective measures were completed at Evaluation unless otherwise noted.  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 Symptoms include leg pain, particularly in the right leg, and dragging of the leg  during ambulation.  Right leg pain with inability to pick up feet (right>left - likely from ventriculomegaly + peripheral neuropathy) patient with history of lumbar disc disease with stenosis - reports improvement during recent trial of prednisone. Suspicious for lumbar Disc Disease in addition to decondition  COGNITION: Overall cognitive status: History of cognitive impairments - at baseline   LOWER EXTREMITY MMT:    MMT Right Eval Left Eval  Hip flexion 3+ 3-  Knee flexion 4- 4-  Knee extension 4- 4-  Ankle dorsiflexion 4- 3-  Ankle plantarflexion 3+ 4-  (Blank rows = not tested)                                                                                                                              TREATMENT DATE: 07/09/2024  -STS from chair + airex to RW x5, minGuard Assist -minGuard AMB in // bars x10  -ball squaeeze 1x10  -RTB clam x15 -RTB at knees marching 1x20   -minGuard STS form chair+airex x5 (cues for form, HAND PLACEMENT)  -ball squaeeze 1x15  -RTB clam x15 -RTB at knees marching 1x20 -seated LKAQ 1x15 bilat (heavy cues for consistent performance)   -minGuard STS form chair+airex x5 (cues for form, HAND PLACEMENT)    PATIENT EDUCATION: Education details: None specific today; heavy cues for motor form, pt not able to maintain fo rlon gdu eto memory impairment.  Person educated: Patient and Caregiver [name] Education  method: Explanation Education comprehension: verbalized understanding and needs further education  HOME EXERCISE PROGRAM: Access Code: [email or web address] URL: [email or web address] Date: 12/20/2023 Prepared by: Connell Kiss   Exercises - Sit to Stand with Armchair  - 1 x daily - 7 x weekly - 3 sets - 5 reps - Standing March with Counter Support  - 1 x daily - 7 x weekly - 2 sets - 10 reps - Supine Bridge  - 1 x daily - 7 x weekly - 2 sets - 10 reps - Supine Heel Slide with Strap  - 1 x daily - 7 x weekly - 2 sets - 10 reps - Supine Hip Abduction AROM  - 1 x daily - 7 x weekly - 3 sets - 10 reps - Seated Long Arc Quad  - 1 x daily - 7 x weekly - 3 sets - 10 reps - Seated March  - 1 x daily - 7 x weekly - 3 sets - 10 reps - 2 hold - Seated Hip Abduction with Resistance  - 1 x daily - 7 x weekly - 2 sets - 10 reps   GOALS: Goals reviewed with patient? Yes  SHORT TERM GOALS: Target date: 07/11/2024  Patient will be independent in home exercise program to improve strength/mobility for better functional independence with ADLs.  Baseline: need to initiate 05/14/2024: Caregiver states engaging patient in this as much as possible Goal status: MET  LONG TERM GOALS: Target date: 08/22/2024  Patient will perform bed mobility with consistently no  more than min A utilizing bed features/supports as needed and with no more than than min cuing from aide/caregiver.  Baseline: requires up to mod A and mod/max verbal cuing 05/14/2024: still requiring mod A and mod cuing with caregiver reporting pt having increased leg pain in the morning impacting progress with this 05/30/2024: still requiring mod A and mod cuing, pt continues to have R LE pain in the mornings impacting independence with this activity 06/25/2024: still requiring mod A and mod cuing, caregiver reports pt's independence with this is impacted by fatigue at night and R LE pain in AM Goal status: IN PROGRESS  2.  Patient will increase  PsFS score to equal to or greater than and average of 2 points to demonstrate statistically significant improvement in mobility and quality of life.  Baseline: 3.667 05/14/2024: 4.66 05/30/2024:  5.33 06/25/2024: 6 Goal status: IN PROGRESS  3.  Patient (> 50 years old) will complete five times sit to stand test in < 30 seconds indicating an increased LE strength and improved balance.  Baseline: 83 seconds from her transport chair with RW in front of her and min progressed to light mod A  05/14/2024: 43.34 seconds from her transport chair with RW in front of her and min progressed to light mod A  05/30/2024: 36.93 seconds from transport chair to RW with min progressed to light mod A (on 2nd trial) 06/25/2024: 42.25 seconds using RW from her personal transport chair - only skilled min A for lifting to stand throughout! Limited by R thigh pain Goal status: IN PROGRESS   4.  Patient will reduce timed up and go to <60 sec seconds to reduce fall risk and demonstrate improved transfer/gait ability.  Baseline: 72min54 seconds using RW with min A  05/14/2024: 55min57 seconds using RW with min A  05/30/2024:  2 min 2 seconds using RW with skilled min A for lifting to stand and balance/AD management during gait 06/25/2024: 2 min and 3 seconds using RW with skilled light min A for steadying - only verbal cuing for AD management Goal status: IN PROGRESS  5.  Patient will increase 10 meter walk test by 0.83m/s as to improve gait speed for better household level ambulation and to reduce fall risk.  Baseline: 0.08m/s with RW  05/14/2024: 0.085 m/s using RW with min A 05/30/2024: 0.107 m/s using RW with skilled min A  06/25/2024:  0.0868 m/s using RW with skilled min A for steadying - speed impacted by R LE thigh pain Goal status: IN PROGRESS   ASSESSMENT:  CLINICAL IMPRESSION:     Mobility remains much impaired as it has over past 4 visits. Weakness in BLE remains significant with difficulty moving legs during all  exercises. Performance in transfers becomes worse over session which is concerning. Pt also appears less steady in standing throughout session. Pt has very high degree of weakness and fatigability unexplained by her primary complaint. No frank pain issues today in session. Session ended 10 minutes early due to decline in activity tolerance within session. Authro concerned that lumbar spinal stenosis and/or severe sensory motor neuropathy are advancing and affecting ability to response to exercise in a desirable way. Patient will benefit from skilled physical therapy intervention to reduce deficits and impairments identified in evaluation, in order to reduce pain, improve quality of life, and maximize activity tolerance for ADL, IADL, and leisure/fitness. Physical therapy will help pt achieve long and short term goals of care.     OBJECTIVE IMPAIRMENTS: Abnormal gait,  decreased activity tolerance, decreased balance, decreased cognition, decreased coordination, decreased endurance, decreased knowledge of condition, decreased knowledge of use of DME, decreased mobility, difficulty walking, decreased ROM, decreased strength, impaired flexibility, impaired sensation, impaired UE functional use, and pain.   ACTIVITY LIMITATIONS: carrying, lifting, sitting, standing, transfers, bed mobility, continence, bathing, toileting, dressing, self feeding, reach over head, hygiene/grooming, and locomotion level  PARTICIPATION LIMITATIONS: meal prep, cleaning, laundry, shopping, and community activity  PERSONAL FACTORS: Age, Time since onset of injury/illness/exacerbation, and 3+ comorbidities: hypothyroidism, recurrent UTIs, osteoarthritis, prior anterior trochanter fracture, CKD stage IIIb, chronic systolic heart failure, dementia, hypertension, history of cardiac pacemaker, history of normal pressure hydrocephalus, overreactive bladder are also affecting patient's functional outcome.   REHAB POTENTIAL: Good  CLINICAL  DECISION MAKING: Evolving/moderate complexity  EVALUATION COMPLEXITY: Moderate  PLAN:  PT FREQUENCY: 1-2x/week  PT DURATION: 12 weeks  PLANNED INTERVENTIONS: 97164- PT Re-evaluation, 97750- Physical Performance Testing, 97110-Therapeutic exercises, 97530- Therapeutic activity, 97112- Neuromuscular re-education, 97535- Self Care, [id number]- Manual therapy, [phone]- Gait training, [phone]- Canalith repositioning, Patient/Family education, Balance training, Stair training, Joint mobilization, Vestibular training, Visual/preceptual remediation/compensation, Cognitive remediation, DME instructions, Cryotherapy, Moist heat, and Biofeedback  PLAN FOR NEXT SESSIONS:     1:35 PM, 07/09/24 Peggye JAYSON Linear, PT, DPT Physical Therapist - [facility]  Outpatient Physical Therapy- [facility] [phone]     *addendum on 08/14/24 to correct typos in Subjective portion on note.   3:31 PM, 08/14/24 Peggye JAYSON Linear, PT, DPT Physical Therapist - [facility]  [phone] [facility])

## 2024-07-11 ENCOUNTER — Ambulatory Visit: Attending: Family Medicine | Admitting: Physical Therapy

## 2024-07-11 DIAGNOSIS — R278 Other lack of coordination: Secondary | ICD-10-CM | POA: Diagnosis present

## 2024-07-11 DIAGNOSIS — R2681 Unsteadiness on feet: Secondary | ICD-10-CM | POA: Insufficient documentation

## 2024-07-11 DIAGNOSIS — R262 Difficulty in walking, not elsewhere classified: Secondary | ICD-10-CM | POA: Diagnosis present

## 2024-07-11 DIAGNOSIS — R269 Unspecified abnormalities of gait and mobility: Secondary | ICD-10-CM | POA: Diagnosis present

## 2024-07-11 DIAGNOSIS — M6281 Muscle weakness (generalized): Secondary | ICD-10-CM | POA: Diagnosis present

## 2024-07-11 DIAGNOSIS — R5381 Other malaise: Secondary | ICD-10-CM | POA: Diagnosis present

## 2024-07-11 NOTE — Therapy (Signed)
 OUTPATIENT PHYSICAL THERAPY TREATMENT   Patient Name: Kerri [name] MRN: [id number] DOB:[birth year], 89 y.o., female Today's Date: 07/11/2024   PCP: Cyrus Selinda Moose, PA-C  REFERRING PROVIDER: Cyrus Selinda Moose, PA-C   END OF SESSION:   PT End of Session - 07/11/24 1316     Visit Number 24    Number of Visits 48    Date for Recertification  08/22/24    Authorization Type [employer] [id number]    Authorization Time Period 24 PT vsts from 7/21-10/13    Authorization - Visit Number 16    Authorization - Number of Visits 24    Progress Note Due on Visit 20    PT Start Time 1319    PT Stop Time 1400    PT Time Calculation (min) 41 min    Equipment Utilized During Treatment Gait belt    Activity Tolerance Patient tolerated treatment well;No increased pain    Behavior During Therapy WFL for tasks assessed/performed             Past Medical History:  Diagnosis Date   Arthritis    Chronic systolic heart failure (HCC)    CKD (chronic kidney disease), stage III (HCC)    Dementia (HCC)    Hypertension    Hypothyroidism    Overactive bladder    Past Surgical History:  Procedure Laterality Date   INTRAMEDULLARY (IM) NAIL INTERTROCHANTERIC Left 09/06/2022   Procedure: INTRAMEDULLARY (IM) NAIL INTERTROCHANTERIC;  Surgeon: Tobie Priest, MD;  Location: [facility];  Service: Orthopedics;  Laterality: Left;   PACEMAKER LEADLESS INSERTION N/A 01/07/2021   Procedure: PACEMAKER LEADLESS INSERTION;  Surgeon: Ammon Blunt, MD;  Location: [facility];  Service: Cardiovascular;  Laterality: N/A;   PPM GENERATOR REMOVAL N/A 01/07/2021   Procedure: PPM GENERATOR REMOVAL;  Surgeon: Ammon Blunt, MD;  Location: [facility];  Service: Cardiovascular;  Laterality: N/A;   Patient Active Problem List   Diagnosis Date Noted   AKI (acute kidney injury) 01/12/2024   Acute postoperative anemia due to expected blood loss 09/07/2022   Displaced  intertrochanteric fracture of left femur, initial encounter for closed fracture (HCC) 09/05/2022   Orthostatic hypotension 07/12/2022   Diarrhea 07/10/2022   COVID-19 virus infection 07/08/2022   Electrolyte abnormality 07/08/2022   Malnutrition of moderate degree 07/08/2022   Acute delirium 07/08/2022   History of recurrent UTI (urinary tract infection) 01/30/2022   Constipation    UTI (urinary tract infection) 10/15/2021   Elevated brain natriuretic peptide (BNP) level 10/15/2021   Generalized weakness 10/15/2021   Chronic kidney disease, stage 3b (HCC) 10/15/2021   Normal pressure hydrocephalus (HCC) 10/15/2021   Dementia without behavioral disturbance (HCC) 10/15/2021   Hypothyroidism 10/15/2021   Essential hypertension 10/15/2021   Overactive bladder 10/15/2021   Moderate mitral regurgitation 01/13/2021   Status post placement of cardiac pacemaker 01/13/2021   CHB (complete heart block) (HCC) 01/07/2021   Chronic systolic CHF (congestive heart failure) (HCC) 12/17/2020   Postmenopausal osteoporosis 08/26/2016   Mixed Alzheimer's and vascular dementia (HCC) 06/13/2014    ONSET DATE: Progressive decline over ~1-1.5 years  REFERRING DIAG:  M51.369 (ICD-10-CM) - Other intervertebral disc degeneration, lumbar region without mention of lumbar back pain or lower extremity pain  R53.81 (ICD-10-CM) - Other malaise  M62.81 (ICD-10-CM) - Muscle weakness (generalized)    THERAPY DIAG:  Muscle weakness (generalized)  Difficulty in walking, not elsewhere classified  Abnormality of gait and mobility  Debility  Unsteadiness on feet  Other lack of coordination  Rationale for Evaluation and Treatment: Rehabilitation  SUBJECTIVE:                                                                                                                                                                                             SUBJECTIVE STATEMENT: Pt reports that she is doing fine today.  Caregiver [name] reports that Monday wasn't a great day. Pt's caregiver has reported that pt is still reporting of some leg pain, but feels like it's continuing to improve. Pt's caregiver reports improvement in GI symptoms.   Inquired about prior TFESI, caregiver reporting only provided short term relief & injection itself was pretty painful, so did not deem to be overall beneficial.   Cardiology appointment to follow today's appointment.   PERTINENT HISTORY:  PMH: hypothyroidism, recurrent UTIs, osteoarthritis, prior anterior trochanter fracture, CKD stage IIIb, chronic systolic heart failure, dementia, hypertension, history of cardiac pacemaker, history of normal pressure hydrocephalus, overreactive bladder       PAIN:  Are you having pain? No  PRECAUTIONS: Fall and ICD/Pacemaker  WEIGHT BEARING RESTRICTIONS: No  FALLS: Has patient fallen in last 6 months? No  LIVING ENVIRONMENT: Lives with: caregiver 7 days a week. Alone for an hour or 2 a day, but only when alseep.   Lives in: House/apartment Stairs: Yes: Internal: 1 but ramp in place steps; none and External: 1 steps; none Has following equipment at home: Walker - 2 wheeled; transport chair; shower chair and grab bars  PLOF: Needs assistance with ADLs, Needs assistance with gait, Needs assistance with transfers, and Needs assistance with ambulation using RW - Primarily uses transport chair for mobility, but will ambulate with assistance to/from bathroom using RW in the home  PATIENT GOALS: Help her walking be easier and help her get stronger   OBJECTIVE:  Note: Objective measures were completed at Evaluation unless otherwise noted.  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 Symptoms include leg pain, particularly in the right leg, and dragging of the leg during ambulation.  Right leg pain with  inability to pick up feet (right>left - likely from ventriculomegaly + peripheral neuropathy) patient with history of lumbar disc disease with stenosis - reports improvement during recent trial of prednisone. Suspicious for lumbar Disc Disease in addition to decondition  COGNITION: Overall cognitive status: History of cognitive impairments - at baseline   LOWER EXTREMITY MMT:    MMT Right Eval Left Eval  Hip flexion 3+ 3-  Knee flexion 4- 4-  Knee extension 4- 4-  Ankle dorsiflexion 4- 3-  Ankle plantarflexion  3+ 4-  (Blank rows = not tested)                                                                                                                              TREATMENT DATE: 07/11/2024    Pt arrives & leaves session in personal transport chair. Unless otherwise noted, at least CGA provided & gait belt donned for patient safety.   2x 2 laps (down & back, ~20' each lap) in // bars minA for facilitation of weight shift onto RLE Verbal cueing throughout for increased upright posture, increased step length Pt also requiring verbal cueing for making complete turn at end of // bars/turning to sit Responds well to cues telling her the direction to point her toes to help her step and turn fully   2x 2 rounds lateral stepping over noodles ~3' apart Pt continuing to respond well to cues telling her to point her toes to help step, maintain lateral stepping positioning Verbal cueing throughout for increased upright posture, sequencing to allow enough room for opposite LE on opposite side of noodle Pt with improved ability to step laterally with external cueing & increased repetitions  Gait with RW, ~85' minA for facilitation of weight shift onto RLE, intermittent assist for navigation of RW Verbal cueing throughout for increased upright posture, increased step length, & maintaining proximity to RW  Pt completing ~5x sit <> stand throughout session with minA, progressing to CGA for  final 2 repetitions. Continuing to benefit from verbal cueing for hand placement.   Pt benefiting from encouragement, education on progress throughout session.      PATIENT EDUCATION: Education details: None specific today; heavy cues for motor form, pt not able to maintain fo rlon gdu eto memory impairment.  Person educated: Patient and Caregiver [name] Education method: Explanation Education comprehension: verbalized understanding and needs further education  HOME EXERCISE PROGRAM: Access Code: [email or web address] URL: [email or web address] Date: 12/20/2023 Prepared by: Connell Kiss   Exercises - Sit to Stand with Armchair  - 1 x daily - 7 x weekly - 3 sets - 5 reps - Standing March with Counter Support  - 1 x daily - 7 x weekly - 2 sets - 10 reps - Supine Bridge  - 1 x daily - 7 x weekly - 2 sets - 10 reps - Supine Heel Slide with Strap  - 1 x daily - 7 x weekly - 2 sets - 10 reps - Supine Hip Abduction AROM  - 1 x daily - 7 x weekly - 3 sets - 10 reps - Seated Long Arc Quad  - 1 x daily - 7 x weekly - 3 sets - 10 reps - Seated March  - 1 x daily - 7 x weekly - 3 sets - 10 reps - 2 hold - Seated Hip Abduction with Resistance  - 1 x daily - 7 x weekly - 2 sets - 10 reps   GOALS: Goals reviewed with patient?  Yes  SHORT TERM GOALS: Target date: 07/11/2024  Patient will be independent in home exercise program to improve strength/mobility for better functional independence with ADLs.  Baseline: need to initiate 05/14/2024: Caregiver states engaging patient in this as much as possible Goal status: MET  LONG TERM GOALS: Target date: 08/22/2024  Patient will perform bed mobility with consistently no more than min A utilizing bed features/supports as needed and with no more than than min cuing from aide/caregiver.  Baseline: requires up to mod A and mod/max verbal cuing 05/14/2024: still requiring mod A and mod cuing with caregiver reporting pt having increased leg pain in the morning  impacting progress with this 05/30/2024: still requiring mod A and mod cuing, pt continues to have R LE pain in the mornings impacting independence with this activity 06/25/2024: still requiring mod A and mod cuing, caregiver reports pt's independence with this is impacted by fatigue at night and R LE pain in AM Goal status: IN PROGRESS  2.  Patient will increase PsFS score to equal to or greater than and average of 2 points to demonstrate statistically significant improvement in mobility and quality of life.  Baseline: 3.667 05/14/2024: 4.66 05/30/2024:  5.33 06/25/2024: 6 Goal status: IN PROGRESS  3.  Patient (> 58 years old) will complete five times sit to stand test in < 30 seconds indicating an increased LE strength and improved balance.  Baseline: 83 seconds from her transport chair with RW in front of her and min progressed to light mod A  05/14/2024: 43.34 seconds from her transport chair with RW in front of her and min progressed to light mod A  05/30/2024: 36.93 seconds from transport chair to RW with min progressed to light mod A (on 2nd trial) 06/25/2024: 42.25 seconds using RW from her personal transport chair - only skilled min A for lifting to stand throughout! Limited by R thigh pain Goal status: IN PROGRESS   4.  Patient will reduce timed up and go to <60 sec seconds to reduce fall risk and demonstrate improved transfer/gait ability.  Baseline: 36min54 seconds using RW with min A  05/14/2024: 56min57 seconds using RW with min A  05/30/2024:  2 min 2 seconds using RW with skilled min A for lifting to stand and balance/AD management during gait 06/25/2024: 2 min and 3 seconds using RW with skilled light min A for steadying - only verbal cuing for AD management Goal status: IN PROGRESS  5.  Patient will increase 10 meter walk test by 0.78m/s as to improve gait speed for better household level ambulation and to reduce fall risk.  Baseline: 0.017m/s with RW  05/14/2024: 0.085 m/s using RW with  min A 05/30/2024: 0.107 m/s using RW with skilled min A  06/25/2024:  0.0868 m/s using RW with skilled min A for steadying - speed impacted by R LE thigh pain Goal status: IN PROGRESS   ASSESSMENT:  CLINICAL IMPRESSION:      Patient arrives motivated to participate in today's session. Pt able to tolerate increased gait distances, both in // bars & with use of RW, this date with minA for facilitation of weight shift onto RLE compared to previous sessions. Pt continuing to present with increased forward flexed posture & decreased step length R > L, improving with verbal cueing for increased upright posture & step length. Pt also able to complete lateral stepping over increased obstacle height compared to previous sessions; improved sequencing for activity with increased repetitions & verbal, external cueing. Pt  without verbal or nonverbal indications of increased pain throughout session. Patient will benefit from skilled physical therapy intervention to reduce deficits and impairments identified in evaluation, in order to reduce pain, improve quality of life, and maximize activity tolerance for ADL, IADL, and leisure/fitness. Physical therapy will help pt achieve long and short term goals of care.     OBJECTIVE IMPAIRMENTS: Abnormal gait, decreased activity tolerance, decreased balance, decreased cognition, decreased coordination, decreased endurance, decreased knowledge of condition, decreased knowledge of use of DME, decreased mobility, difficulty walking, decreased ROM, decreased strength, impaired flexibility, impaired sensation, impaired UE functional use, and pain.   ACTIVITY LIMITATIONS: carrying, lifting, sitting, standing, transfers, bed mobility, continence, bathing, toileting, dressing, self feeding, reach over head, hygiene/grooming, and locomotion level  PARTICIPATION LIMITATIONS: meal prep, cleaning, laundry, shopping, and community activity  PERSONAL FACTORS: Age, Time since onset of  injury/illness/exacerbation, and 3+ comorbidities: hypothyroidism, recurrent UTIs, osteoarthritis, prior anterior trochanter fracture, CKD stage IIIb, chronic systolic heart failure, dementia, hypertension, history of cardiac pacemaker, history of normal pressure hydrocephalus, overreactive bladder are also affecting patient's functional outcome.   REHAB POTENTIAL: Good  CLINICAL DECISION MAKING: Evolving/moderate complexity  EVALUATION COMPLEXITY: Moderate  PLAN:  PT FREQUENCY: 1-2x/week  PT DURATION: 12 weeks  PLANNED INTERVENTIONS: 97164- PT Re-evaluation, 97750- Physical Performance Testing, 97110-Therapeutic exercises, 97530- Therapeutic activity, 97112- Neuromuscular re-education, 97535- Self Care, [id number]- Manual therapy, [phone]- Gait training, [phone]- Canalith repositioning, Patient/Family education, Balance training, Stair training, Joint mobilization, Vestibular training, Visual/preceptual remediation/compensation, Cognitive remediation, DME instructions, Cryotherapy, Moist heat, and Biofeedback  PLAN FOR NEXT SESSIONS:   Gait training - in // bars, with use of RW Activities to promote increased upright posture Functional strengthening to improve transfers to minimize caregiver burden  2:11 PM, 07/11/24 Chiquita Silvan, SPT Physical Therapy Student - [place]  [facility]

## 2024-07-13 ENCOUNTER — Ambulatory Visit: Admitting: Physical Therapy

## 2024-07-16 ENCOUNTER — Ambulatory Visit

## 2024-07-16 ENCOUNTER — Ambulatory Visit: Admitting: Physical Therapy

## 2024-07-16 NOTE — Therapy (Incomplete)
 OUTPATIENT PHYSICAL THERAPY TREATMENT   Patient Name: Kerri [name] MRN: [id number] DOB:1931/03/17, 89 y.o., female Today's Date: 07/16/2024   PCP: Cyrus Selinda Moose, PA-C  REFERRING PROVIDER: Cyrus Selinda Moose, PA-C   END OF SESSION: ***       Past Medical History:  Diagnosis Date   Arthritis    Chronic systolic heart failure (HCC)    CKD (chronic kidney disease), stage III (HCC)    Dementia (HCC)    Hypertension    Hypothyroidism    Overactive bladder    Past Surgical History:  Procedure Laterality Date   INTRAMEDULLARY (IM) NAIL INTERTROCHANTERIC Left 09/06/2022   Procedure: INTRAMEDULLARY (IM) NAIL INTERTROCHANTERIC;  Surgeon: Tobie Priest, MD;  Location: [facility];  Service: Orthopedics;  Laterality: Left;   PACEMAKER LEADLESS INSERTION N/A 01/07/2021   Procedure: PACEMAKER LEADLESS INSERTION;  Surgeon: Ammon Blunt, MD;  Location: [facility];  Service: Cardiovascular;  Laterality: N/A;   PPM GENERATOR REMOVAL N/A 01/07/2021   Procedure: PPM GENERATOR REMOVAL;  Surgeon: Ammon Blunt, MD;  Location: [facility];  Service: Cardiovascular;  Laterality: N/A;   Patient Active Problem List   Diagnosis Date Noted   AKI (acute kidney injury) 01/12/2024   Acute postoperative anemia due to expected blood loss 09/07/2022   Displaced intertrochanteric fracture of left femur, initial encounter for closed fracture (HCC) 09/05/2022   Orthostatic hypotension 07/12/2022   Diarrhea 07/10/2022   COVID-19 virus infection 07/08/2022   Electrolyte abnormality 07/08/2022   Malnutrition of moderate degree 07/08/2022   Acute delirium 07/08/2022   History of recurrent UTI (urinary tract infection) 01/30/2022   Constipation    UTI (urinary tract infection) 10/15/2021   Elevated brain natriuretic peptide (BNP) level 10/15/2021   Generalized weakness 10/15/2021   Chronic kidney disease, stage 3b (HCC) 10/15/2021   Normal pressure  hydrocephalus (HCC) 10/15/2021   Dementia without behavioral disturbance (HCC) 10/15/2021   Hypothyroidism 10/15/2021   Essential hypertension 10/15/2021   Overactive bladder 10/15/2021   Moderate mitral regurgitation 01/13/2021   Status post placement of cardiac pacemaker 01/13/2021   CHB (complete heart block) (HCC) 01/07/2021   Chronic systolic CHF (congestive heart failure) (HCC) 12/17/2020   Postmenopausal osteoporosis 08/26/2016   Mixed Alzheimer's and vascular dementia (HCC) 06/13/2014    ONSET DATE: Progressive decline over ~1-1.5 years  REFERRING DIAG:  M51.369 (ICD-10-CM) - Other intervertebral disc degeneration, lumbar region without mention of lumbar back pain or lower extremity pain  R53.81 (ICD-10-CM) - Other malaise  M62.81 (ICD-10-CM) - Muscle weakness (generalized)    THERAPY DIAG: *** No diagnosis found.  Rationale for Evaluation and Treatment: Rehabilitation  SUBJECTIVE:  SUBJECTIVE STATEMENT: *** Pt reports that she is doing fine today. Caregiver [name] reports that Monday wasn't a great day. Pt's caregiver has reported that pt is still reporting of some leg pain, but feels like it's continuing to improve. Pt's caregiver reports improvement in GI symptoms.   Inquired about prior TFESI, caregiver reporting only provided short term relief & injection itself was pretty painful, so did not deem to be overall beneficial.   Cardiology appointment to follow today's appointment.   PERTINENT HISTORY:  PMH: hypothyroidism, recurrent UTIs, osteoarthritis, prior anterior trochanter fracture, CKD stage IIIb, chronic systolic heart failure, dementia, hypertension, history of cardiac pacemaker, history of normal pressure hydrocephalus, overreactive bladder       PAIN:  Are you having  pain? No  PRECAUTIONS: Fall and ICD/Pacemaker  WEIGHT BEARING RESTRICTIONS: No  FALLS: Has patient fallen in last 6 months? No  LIVING ENVIRONMENT: Lives with: caregiver 7 days a week. Alone for an hour or 2 a day, but only when alseep.   Lives in: House/apartment Stairs: Yes: Internal: 1 but ramp in place steps; none and External: 1 steps; none Has following equipment at home: Walker - 2 wheeled; transport chair; shower chair and grab bars  PLOF: Needs assistance with ADLs, Needs assistance with gait, Needs assistance with transfers, and Needs assistance with ambulation using RW - Primarily uses transport chair for mobility, but will ambulate with assistance to/from bathroom using RW in the home  PATIENT GOALS: Help her walking be easier and help her get stronger   OBJECTIVE:  Note: Objective measures were completed at Evaluation unless otherwise noted.  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 Symptoms include leg pain, particularly in the right leg, and dragging of the leg during ambulation.  Right leg pain with inability to pick up feet (right>left - likely from ventriculomegaly + peripheral neuropathy) patient with history of lumbar disc disease with stenosis - reports improvement during recent trial of prednisone. Suspicious for lumbar Disc Disease in addition to decondition  COGNITION: Overall cognitive status: History of cognitive impairments - at baseline   LOWER EXTREMITY MMT:    MMT Right Eval Left Eval  Hip flexion 3+ 3-  Knee flexion 4- 4-  Knee extension 4- 4-  Ankle dorsiflexion 4- 3-  Ankle plantarflexion 3+ 4-  (Blank rows = not tested)                                                                                                                              TREATMENT DATE: 07/16/2024   ***  Pt arrives & leaves session in  personal transport chair. Unless otherwise noted, at least CGA provided & gait belt donned for patient safety.   2x 2 laps (down & back, ~20' each lap) in // bars minA for facilitation of weight shift onto RLE Verbal cueing throughout for increased upright posture, increased step length  Pt also requiring verbal cueing for making complete turn at end of // bars/turning to sit Responds well to cues telling her the direction to point her toes to help her step and turn fully   2x 2 rounds lateral stepping over noodles ~3' apart Pt continuing to respond well to cues telling her to point her toes to help step, maintain lateral stepping positioning Verbal cueing throughout for increased upright posture, sequencing to allow enough room for opposite LE on opposite side of noodle Pt with improved ability to step laterally with external cueing & increased repetitions  Gait with RW, ~85' minA for facilitation of weight shift onto RLE, intermittent assist for navigation of RW Verbal cueing throughout for increased upright posture, increased step length, & maintaining proximity to RW  Pt completing ~5x sit <> stand throughout session with minA, progressing to CGA for final 2 repetitions. Continuing to benefit from verbal cueing for hand placement.   Pt benefiting from encouragement, education on progress throughout session.      PATIENT EDUCATION: Education details: None specific today; heavy cues for motor form, pt not able to maintain fo rlon gdu eto memory impairment.  Person educated: Patient and Caregiver [name] Education method: Explanation Education comprehension: verbalized understanding and needs further education  HOME EXERCISE PROGRAM: Access Code: [email or web address] URL: [email or web address] Date: 12/20/2023 Prepared by: Connell Kiss   Exercises - Sit to Stand with Armchair  - 1 x daily - 7 x weekly - 3 sets - 5 reps - Standing March with Counter Support  - 1 x daily - 7 x  weekly - 2 sets - 10 reps - Supine Bridge  - 1 x daily - 7 x weekly - 2 sets - 10 reps - Supine Heel Slide with Strap  - 1 x daily - 7 x weekly - 2 sets - 10 reps - Supine Hip Abduction AROM  - 1 x daily - 7 x weekly - 3 sets - 10 reps - Seated Long Arc Quad  - 1 x daily - 7 x weekly - 3 sets - 10 reps - Seated March  - 1 x daily - 7 x weekly - 3 sets - 10 reps - 2 hold - Seated Hip Abduction with Resistance  - 1 x daily - 7 x weekly - 2 sets - 10 reps   GOALS: Goals reviewed with patient? Yes  SHORT TERM GOALS: Target date: 07/11/2024  Patient will be independent in home exercise program to improve strength/mobility for better functional independence with ADLs.  Baseline: need to initiate 05/14/2024: Caregiver states engaging patient in this as much as possible Goal status: MET  LONG TERM GOALS: Target date: 08/22/2024  Patient will perform bed mobility with consistently no more than min A utilizing bed features/supports as needed and with no more than than min cuing from aide/caregiver.  Baseline: requires up to mod A and mod/max verbal cuing 05/14/2024: still requiring mod A and mod cuing with caregiver reporting pt having increased leg pain in the morning impacting progress with this 05/30/2024: still requiring mod A and mod cuing, pt continues to have R LE pain in the mornings impacting independence with this activity 06/25/2024: still requiring mod A and mod cuing, caregiver reports pt's independence with this is impacted by fatigue at night and R LE pain in AM Goal status: IN PROGRESS  2.  Patient will increase PsFS score to equal to or greater than and average of 2 points to demonstrate statistically significant improvement in mobility and  quality of life.  Baseline: 3.667 05/14/2024: 4.66 05/30/2024:  5.33 06/25/2024: 6 Goal status: IN PROGRESS  3.  Patient (> 30 years old) will complete five times sit to stand test in < 30 seconds indicating an increased LE strength and improved  balance.  Baseline: 83 seconds from her transport chair with RW in front of her and min progressed to light mod A  05/14/2024: 43.34 seconds from her transport chair with RW in front of her and min progressed to light mod A  05/30/2024: 36.93 seconds from transport chair to RW with min progressed to light mod A (on 2nd trial) 06/25/2024: 42.25 seconds using RW from her personal transport chair - only skilled min A for lifting to stand throughout! Limited by R thigh pain Goal status: IN PROGRESS   4.  Patient will reduce timed up and go to <60 sec seconds to reduce fall risk and demonstrate improved transfer/gait ability.  Baseline: 49min54 seconds using RW with min A  05/14/2024: 20min57 seconds using RW with min A  05/30/2024:  2 min 2 seconds using RW with skilled min A for lifting to stand and balance/AD management during gait 06/25/2024: 2 min and 3 seconds using RW with skilled light min A for steadying - only verbal cuing for AD management Goal status: IN PROGRESS  5.  Patient will increase 10 meter walk test by 0.52m/s as to improve gait speed for better household level ambulation and to reduce fall risk.  Baseline: 0.010m/s with RW  05/14/2024: 0.085 m/s using RW with min A 05/30/2024: 0.107 m/s using RW with skilled min A  06/25/2024:  0.0868 m/s using RW with skilled min A for steadying - speed impacted by R LE thigh pain Goal status: IN PROGRESS   ASSESSMENT:  CLINICAL IMPRESSION:     *** Patient arrives motivated to participate in today's session. Pt able to tolerate increased gait distances, both in // bars & with use of RW, this date with minA for facilitation of weight shift onto RLE compared to previous sessions. Pt continuing to present with increased forward flexed posture & decreased step length R > L, improving with verbal cueing for increased upright posture & step length. Pt also able to complete lateral stepping over increased obstacle height compared to previous sessions; improved  sequencing for activity with increased repetitions & verbal, external cueing. Pt without verbal or nonverbal indications of increased pain throughout session. Patient will benefit from skilled physical therapy intervention to reduce deficits and impairments identified in evaluation, in order to reduce pain, improve quality of life, and maximize activity tolerance for ADL, IADL, and leisure/fitness. Physical therapy will help pt achieve long and short term goals of care.     OBJECTIVE IMPAIRMENTS: Abnormal gait, decreased activity tolerance, decreased balance, decreased cognition, decreased coordination, decreased endurance, decreased knowledge of condition, decreased knowledge of use of DME, decreased mobility, difficulty walking, decreased ROM, decreased strength, impaired flexibility, impaired sensation, impaired UE functional use, and pain.   ACTIVITY LIMITATIONS: carrying, lifting, sitting, standing, transfers, bed mobility, continence, bathing, toileting, dressing, self feeding, reach over head, hygiene/grooming, and locomotion level  PARTICIPATION LIMITATIONS: meal prep, cleaning, laundry, shopping, and community activity  PERSONAL FACTORS: Age, Time since onset of injury/illness/exacerbation, and 3+ comorbidities: hypothyroidism, recurrent UTIs, osteoarthritis, prior anterior trochanter fracture, CKD stage IIIb, chronic systolic heart failure, dementia, hypertension, history of cardiac pacemaker, history of normal pressure hydrocephalus, overreactive bladder are also affecting patient's functional outcome.   REHAB POTENTIAL: Good  CLINICAL DECISION MAKING:  Evolving/moderate complexity  EVALUATION COMPLEXITY: Moderate  PLAN:  PT FREQUENCY: 1-2x/week  PT DURATION: 12 weeks  PLANNED INTERVENTIONS: 97164- PT Re-evaluation, 97750- Physical Performance Testing, 97110-Therapeutic exercises, 97530- Therapeutic activity, 97112- Neuromuscular re-education, 97535- Self Care, [id number]- Manual therapy,  [phone]- Gait training, [phone]- Canalith repositioning, Patient/Family education, Balance training, Stair training, Joint mobilization, Vestibular training, Visual/preceptual remediation/compensation, Cognitive remediation, DME instructions, Cryotherapy, Moist heat, and Biofeedback  PLAN FOR NEXT SESSIONS:  *** Gait training - in // bars, with use of RW Activities to promote increased upright posture Functional strengthening to improve transfers to minimize caregiver burden  10:38 AM, 07/16/24 Chiquita Silvan, SPT Physical Therapy Student - [place]  [facility]

## 2024-07-17 ENCOUNTER — Ambulatory Visit: Admitting: Physical Therapy

## 2024-07-17 NOTE — Therapy (Incomplete)
 OUTPATIENT PHYSICAL THERAPY TREATMENT   Patient Name: Kerri [name] MRN: [id number] DOB:Apr 08, 1931, 89 y.o., female Today's Date: 07/17/2024   PCP: Cyrus Selinda Moose, PA-C  REFERRING PROVIDER: Cyrus Selinda Moose, PA-C   END OF SESSION: ***       Past Medical History:  Diagnosis Date   Arthritis    Chronic systolic heart failure (HCC)    CKD (chronic kidney disease), stage III (HCC)    Dementia (HCC)    Hypertension    Hypothyroidism    Overactive bladder    Past Surgical History:  Procedure Laterality Date   INTRAMEDULLARY (IM) NAIL INTERTROCHANTERIC Left 09/06/2022   Procedure: INTRAMEDULLARY (IM) NAIL INTERTROCHANTERIC;  Surgeon: Tobie Priest, MD;  Location: [facility];  Service: Orthopedics;  Laterality: Left;   PACEMAKER LEADLESS INSERTION N/A 01/07/2021   Procedure: PACEMAKER LEADLESS INSERTION;  Surgeon: Ammon Blunt, MD;  Location: [facility];  Service: Cardiovascular;  Laterality: N/A;   PPM GENERATOR REMOVAL N/A 01/07/2021   Procedure: PPM GENERATOR REMOVAL;  Surgeon: Ammon Blunt, MD;  Location: [facility];  Service: Cardiovascular;  Laterality: N/A;   Patient Active Problem List   Diagnosis Date Noted   AKI (acute kidney injury) 01/12/2024   Acute postoperative anemia due to expected blood loss 09/07/2022   Displaced intertrochanteric fracture of left femur, initial encounter for closed fracture (HCC) 09/05/2022   Orthostatic hypotension 07/12/2022   Diarrhea 07/10/2022   COVID-19 virus infection 07/08/2022   Electrolyte abnormality 07/08/2022   Malnutrition of moderate degree 07/08/2022   Acute delirium 07/08/2022   History of recurrent UTI (urinary tract infection) 01/30/2022   Constipation    UTI (urinary tract infection) 10/15/2021   Elevated brain natriuretic peptide (BNP) level 10/15/2021   Generalized weakness 10/15/2021   Chronic kidney disease, stage 3b (HCC) 10/15/2021   Normal pressure  hydrocephalus (HCC) 10/15/2021   Dementia without behavioral disturbance (HCC) 10/15/2021   Hypothyroidism 10/15/2021   Essential hypertension 10/15/2021   Overactive bladder 10/15/2021   Moderate mitral regurgitation 01/13/2021   Status post placement of cardiac pacemaker 01/13/2021   CHB (complete heart block) (HCC) 01/07/2021   Chronic systolic CHF (congestive heart failure) (HCC) 12/17/2020   Postmenopausal osteoporosis 08/26/2016   Mixed Alzheimer's and vascular dementia (HCC) 06/13/2014    ONSET DATE: Progressive decline over ~1-1.5 years  REFERRING DIAG:  M51.369 (ICD-10-CM) - Other intervertebral disc degeneration, lumbar region without mention of lumbar back pain or lower extremity pain  R53.81 (ICD-10-CM) - Other malaise  M62.81 (ICD-10-CM) - Muscle weakness (generalized)    THERAPY DIAG: *** No diagnosis found.  Rationale for Evaluation and Treatment: Rehabilitation  SUBJECTIVE:  SUBJECTIVE STATEMENT: *** Pt reports that she is doing fine today. Caregiver [name] reports that Monday wasn't a great day. Pt's caregiver has reported that pt is still reporting of some leg pain, but feels like it's continuing to improve. Pt's caregiver reports improvement in GI symptoms.   Inquired about prior TFESI, caregiver reporting only provided short term relief & injection itself was pretty painful, so did not deem to be overall beneficial.   Cardiology appointment to follow today's appointment.   PERTINENT HISTORY:  PMH: hypothyroidism, recurrent UTIs, osteoarthritis, prior anterior trochanter fracture, CKD stage IIIb, chronic systolic heart failure, dementia, hypertension, history of cardiac pacemaker, history of normal pressure hydrocephalus, overreactive bladder       PAIN:  Are you having  pain? No  PRECAUTIONS: Fall and ICD/Pacemaker  WEIGHT BEARING RESTRICTIONS: No  FALLS: Has patient fallen in last 6 months? No  LIVING ENVIRONMENT: Lives with: caregiver 7 days a week. Alone for an hour or 2 a day, but only when alseep.   Lives in: House/apartment Stairs: Yes: Internal: 1 but ramp in place steps; none and External: 1 steps; none Has following equipment at home: Walker - 2 wheeled; transport chair; shower chair and grab bars  PLOF: Needs assistance with ADLs, Needs assistance with gait, Needs assistance with transfers, and Needs assistance with ambulation using RW - Primarily uses transport chair for mobility, but will ambulate with assistance to/from bathroom using RW in the home  PATIENT GOALS: Help her walking be easier and help her get stronger   OBJECTIVE:  Note: Objective measures were completed at Evaluation unless otherwise noted.  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 Symptoms include leg pain, particularly in the right leg, and dragging of the leg during ambulation.  Right leg pain with inability to pick up feet (right>left - likely from ventriculomegaly + peripheral neuropathy) patient with history of lumbar disc disease with stenosis - reports improvement during recent trial of prednisone. Suspicious for lumbar Disc Disease in addition to decondition  COGNITION: Overall cognitive status: History of cognitive impairments - at baseline   LOWER EXTREMITY MMT:    MMT Right Eval Left Eval  Hip flexion 3+ 3-  Knee flexion 4- 4-  Knee extension 4- 4-  Ankle dorsiflexion 4- 3-  Ankle plantarflexion 3+ 4-  (Blank rows = not tested)                                                                                                                              TREATMENT DATE: 07/17/2024   ***  Pt arrives & leaves session in  personal transport chair. Unless otherwise noted, at least CGA provided & gait belt donned for patient safety.   2x 2 laps (down & back, ~20' each lap) in // bars minA for facilitation of weight shift onto RLE Verbal cueing throughout for increased upright posture, increased step length  Pt also requiring verbal cueing for making complete turn at end of // bars/turning to sit Responds well to cues telling her the direction to point her toes to help her step and turn fully   2x 2 rounds lateral stepping over noodles ~3' apart Pt continuing to respond well to cues telling her to point her toes to help step, maintain lateral stepping positioning Verbal cueing throughout for increased upright posture, sequencing to allow enough room for opposite LE on opposite side of noodle Pt with improved ability to step laterally with external cueing & increased repetitions  Gait with RW, ~85' minA for facilitation of weight shift onto RLE, intermittent assist for navigation of RW Verbal cueing throughout for increased upright posture, increased step length, & maintaining proximity to RW  Pt completing ~5x sit <> stand throughout session with minA, progressing to CGA for final 2 repetitions. Continuing to benefit from verbal cueing for hand placement.   Pt benefiting from encouragement, education on progress throughout session.      PATIENT EDUCATION: Education details: None specific today; heavy cues for motor form, pt not able to maintain fo rlon gdu eto memory impairment.  Person educated: Patient and Caregiver [name] Education method: Explanation Education comprehension: verbalized understanding and needs further education  HOME EXERCISE PROGRAM: Access Code: [email or web address] URL: [email or web address] Date: 12/20/2023 Prepared by: Connell Kiss   Exercises - Sit to Stand with Armchair  - 1 x daily - 7 x weekly - 3 sets - 5 reps - Standing March with Counter Support  - 1 x daily - 7 x  weekly - 2 sets - 10 reps - Supine Bridge  - 1 x daily - 7 x weekly - 2 sets - 10 reps - Supine Heel Slide with Strap  - 1 x daily - 7 x weekly - 2 sets - 10 reps - Supine Hip Abduction AROM  - 1 x daily - 7 x weekly - 3 sets - 10 reps - Seated Long Arc Quad  - 1 x daily - 7 x weekly - 3 sets - 10 reps - Seated March  - 1 x daily - 7 x weekly - 3 sets - 10 reps - 2 hold - Seated Hip Abduction with Resistance  - 1 x daily - 7 x weekly - 2 sets - 10 reps   GOALS: Goals reviewed with patient? Yes  SHORT TERM GOALS: Target date: 07/11/2024  Patient will be independent in home exercise program to improve strength/mobility for better functional independence with ADLs.  Baseline: need to initiate 05/14/2024: Caregiver states engaging patient in this as much as possible Goal status: MET  LONG TERM GOALS: Target date: 08/22/2024  Patient will perform bed mobility with consistently no more than min A utilizing bed features/supports as needed and with no more than than min cuing from aide/caregiver.  Baseline: requires up to mod A and mod/max verbal cuing 05/14/2024: still requiring mod A and mod cuing with caregiver reporting pt having increased leg pain in the morning impacting progress with this 05/30/2024: still requiring mod A and mod cuing, pt continues to have R LE pain in the mornings impacting independence with this activity 06/25/2024: still requiring mod A and mod cuing, caregiver reports pt's independence with this is impacted by fatigue at night and R LE pain in AM Goal status: IN PROGRESS  2.  Patient will increase PsFS score to equal to or greater than and average of 2 points to demonstrate statistically significant improvement in mobility and  quality of life.  Baseline: 3.667 05/14/2024: 4.66 05/30/2024:  5.33 06/25/2024: 6 Goal status: IN PROGRESS  3.  Patient (> 23 years old) will complete five times sit to stand test in < 30 seconds indicating an increased LE strength and improved  balance.  Baseline: 83 seconds from her transport chair with RW in front of her and min progressed to light mod A  05/14/2024: 43.34 seconds from her transport chair with RW in front of her and min progressed to light mod A  05/30/2024: 36.93 seconds from transport chair to RW with min progressed to light mod A (on 2nd trial) 06/25/2024: 42.25 seconds using RW from her personal transport chair - only skilled min A for lifting to stand throughout! Limited by R thigh pain Goal status: IN PROGRESS   4.  Patient will reduce timed up and go to <60 sec seconds to reduce fall risk and demonstrate improved transfer/gait ability.  Baseline: 60min54 seconds using RW with min A  05/14/2024: 93min57 seconds using RW with min A  05/30/2024:  2 min 2 seconds using RW with skilled min A for lifting to stand and balance/AD management during gait 06/25/2024: 2 min and 3 seconds using RW with skilled light min A for steadying - only verbal cuing for AD management Goal status: IN PROGRESS  5.  Patient will increase 10 meter walk test by 0.21m/s as to improve gait speed for better household level ambulation and to reduce fall risk.  Baseline: 0.060m/s with RW  05/14/2024: 0.085 m/s using RW with min A 05/30/2024: 0.107 m/s using RW with skilled min A  06/25/2024:  0.0868 m/s using RW with skilled min A for steadying - speed impacted by R LE thigh pain Goal status: IN PROGRESS   ASSESSMENT:  CLINICAL IMPRESSION:     *** Patient arrives motivated to participate in today's session. Pt able to tolerate increased gait distances, both in // bars & with use of RW, this date with minA for facilitation of weight shift onto RLE compared to previous sessions. Pt continuing to present with increased forward flexed posture & decreased step length R > L, improving with verbal cueing for increased upright posture & step length. Pt also able to complete lateral stepping over increased obstacle height compared to previous sessions; improved  sequencing for activity with increased repetitions & verbal, external cueing. Pt without verbal or nonverbal indications of increased pain throughout session. Patient will benefit from skilled physical therapy intervention to reduce deficits and impairments identified in evaluation, in order to reduce pain, improve quality of life, and maximize activity tolerance for ADL, IADL, and leisure/fitness. Physical therapy will help pt achieve long and short term goals of care.     OBJECTIVE IMPAIRMENTS: Abnormal gait, decreased activity tolerance, decreased balance, decreased cognition, decreased coordination, decreased endurance, decreased knowledge of condition, decreased knowledge of use of DME, decreased mobility, difficulty walking, decreased ROM, decreased strength, impaired flexibility, impaired sensation, impaired UE functional use, and pain.   ACTIVITY LIMITATIONS: carrying, lifting, sitting, standing, transfers, bed mobility, continence, bathing, toileting, dressing, self feeding, reach over head, hygiene/grooming, and locomotion level  PARTICIPATION LIMITATIONS: meal prep, cleaning, laundry, shopping, and community activity  PERSONAL FACTORS: Age, Time since onset of injury/illness/exacerbation, and 3+ comorbidities: hypothyroidism, recurrent UTIs, osteoarthritis, prior anterior trochanter fracture, CKD stage IIIb, chronic systolic heart failure, dementia, hypertension, history of cardiac pacemaker, history of normal pressure hydrocephalus, overreactive bladder are also affecting patient's functional outcome.   REHAB POTENTIAL: Good  CLINICAL DECISION MAKING:  Evolving/moderate complexity  EVALUATION COMPLEXITY: Moderate  PLAN:  PT FREQUENCY: 1-2x/week  PT DURATION: 12 weeks  PLANNED INTERVENTIONS: 97164- PT Re-evaluation, 97750- Physical Performance Testing, 97110-Therapeutic exercises, 97530- Therapeutic activity, 97112- Neuromuscular re-education, 97535- Self Care, [id number]- Manual therapy,  [phone]- Gait training, [id number]- Canalith repositioning, Patient/Family education, Balance training, Stair training, Joint mobilization, Vestibular training, Visual/preceptual remediation/compensation, Cognitive remediation, DME instructions, Cryotherapy, Moist heat, and Biofeedback  PLAN FOR NEXT SESSIONS:  *** Gait training - in // bars, with use of RW Activities to promote increased upright posture Functional strengthening to improve transfers to minimize caregiver burden  8:01 AM, 07/17/24 Chiquita Silvan, SPT Physical Therapy Student - [place]  [facility]

## 2024-07-18 ENCOUNTER — Telehealth: Payer: Self-pay | Admitting: Physical Therapy

## 2024-07-18 ENCOUNTER — Ambulatory Visit: Admitting: Physical Therapy

## 2024-07-18 NOTE — Therapy (Incomplete)
 OUTPATIENT PHYSICAL THERAPY TREATMENT   Patient Name: Kerri [name] MRN: [id number] DOB:December 02, 1930, 89 y.o., female Today's Date: 07/18/2024   PCP: Cyrus Selinda Moose, PA-C  REFERRING PROVIDER: Cyrus Selinda Moose, PA-C   END OF SESSION: ***       Past Medical History:  Diagnosis Date   Arthritis    Chronic systolic heart failure (HCC)    CKD (chronic kidney disease), stage III (HCC)    Dementia (HCC)    Hypertension    Hypothyroidism    Overactive bladder    Past Surgical History:  Procedure Laterality Date   INTRAMEDULLARY (IM) NAIL INTERTROCHANTERIC Left 09/06/2022   Procedure: INTRAMEDULLARY (IM) NAIL INTERTROCHANTERIC;  Surgeon: Tobie Priest, MD;  Location: [facility];  Service: Orthopedics;  Laterality: Left;   PACEMAKER LEADLESS INSERTION N/A 01/07/2021   Procedure: PACEMAKER LEADLESS INSERTION;  Surgeon: Ammon Blunt, MD;  Location: [facility];  Service: Cardiovascular;  Laterality: N/A;   PPM GENERATOR REMOVAL N/A 01/07/2021   Procedure: PPM GENERATOR REMOVAL;  Surgeon: Ammon Blunt, MD;  Location: [facility];  Service: Cardiovascular;  Laterality: N/A;   Patient Active Problem List   Diagnosis Date Noted   AKI (acute kidney injury) 01/12/2024   Acute postoperative anemia due to expected blood loss 09/07/2022   Displaced intertrochanteric fracture of left femur, initial encounter for closed fracture (HCC) 09/05/2022   Orthostatic hypotension 07/12/2022   Diarrhea 07/10/2022   COVID-19 virus infection 07/08/2022   Electrolyte abnormality 07/08/2022   Malnutrition of moderate degree 07/08/2022   Acute delirium 07/08/2022   History of recurrent UTI (urinary tract infection) 01/30/2022   Constipation    UTI (urinary tract infection) 10/15/2021   Elevated brain natriuretic peptide (BNP) level 10/15/2021   Generalized weakness 10/15/2021   Chronic kidney disease, stage 3b (HCC) 10/15/2021   Normal pressure  hydrocephalus (HCC) 10/15/2021   Dementia without behavioral disturbance (HCC) 10/15/2021   Hypothyroidism 10/15/2021   Essential hypertension 10/15/2021   Overactive bladder 10/15/2021   Moderate mitral regurgitation 01/13/2021   Status post placement of cardiac pacemaker 01/13/2021   CHB (complete heart block) (HCC) 01/07/2021   Chronic systolic CHF (congestive heart failure) (HCC) 12/17/2020   Postmenopausal osteoporosis 08/26/2016   Mixed Alzheimer's and vascular dementia (HCC) 06/13/2014    ONSET DATE: Progressive decline over ~1-1.5 years  REFERRING DIAG:  M51.369 (ICD-10-CM) - Other intervertebral disc degeneration, lumbar region without mention of lumbar back pain or lower extremity pain  R53.81 (ICD-10-CM) - Other malaise  M62.81 (ICD-10-CM) - Muscle weakness (generalized)    THERAPY DIAG: *** No diagnosis found.  Rationale for Evaluation and Treatment: Rehabilitation  SUBJECTIVE:  SUBJECTIVE STATEMENT: *** Pt reports that she is doing fine today. Caregiver [name] reports that Monday wasn't a great day. Pt's caregiver has reported that pt is still reporting of some leg pain, but feels like it's continuing to improve. Pt's caregiver reports improvement in GI symptoms.   Inquired about prior TFESI, caregiver reporting only provided short term relief & injection itself was pretty painful, so did not deem to be overall beneficial.   Cardiology appointment to follow today's appointment.   PERTINENT HISTORY:  PMH: hypothyroidism, recurrent UTIs, osteoarthritis, prior anterior trochanter fracture, CKD stage IIIb, chronic systolic heart failure, dementia, hypertension, history of cardiac pacemaker, history of normal pressure hydrocephalus, overreactive bladder       PAIN:  Are you having  pain? No  PRECAUTIONS: Fall and ICD/Pacemaker  WEIGHT BEARING RESTRICTIONS: No  FALLS: Has patient fallen in last 6 months? No  LIVING ENVIRONMENT: Lives with: caregiver 7 days a week. Alone for an hour or 2 a day, but only when alseep.   Lives in: House/apartment Stairs: Yes: Internal: 1 but ramp in place steps; none and External: 1 steps; none Has following equipment at home: Walker - 2 wheeled; transport chair; shower chair and grab bars  PLOF: Needs assistance with ADLs, Needs assistance with gait, Needs assistance with transfers, and Needs assistance with ambulation using RW - Primarily uses transport chair for mobility, but will ambulate with assistance to/from bathroom using RW in the home  PATIENT GOALS: Help her walking be easier and help her get stronger   OBJECTIVE:  Note: Objective measures were completed at Evaluation unless otherwise noted.  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 Symptoms include leg pain, particularly in the right leg, and dragging of the leg during ambulation.  Right leg pain with inability to pick up feet (right>left - likely from ventriculomegaly + peripheral neuropathy) patient with history of lumbar disc disease with stenosis - reports improvement during recent trial of prednisone. Suspicious for lumbar Disc Disease in addition to decondition  COGNITION: Overall cognitive status: History of cognitive impairments - at baseline   LOWER EXTREMITY MMT:    MMT Right Eval Left Eval  Hip flexion 3+ 3-  Knee flexion 4- 4-  Knee extension 4- 4-  Ankle dorsiflexion 4- 3-  Ankle plantarflexion 3+ 4-  (Blank rows = not tested)                                                                                                                              TREATMENT DATE: 07/18/2024   ***  Pt arrives & leaves session in  personal transport chair. Unless otherwise noted, at least CGA provided & gait belt donned for patient safety.   2x 2 laps (down & back, ~20' each lap) in // bars minA for facilitation of weight shift onto RLE Verbal cueing throughout for increased upright posture, increased step length  Pt also requiring verbal cueing for making complete turn at end of // bars/turning to sit Responds well to cues telling her the direction to point her toes to help her step and turn fully   2x 2 rounds lateral stepping over noodles ~3' apart Pt continuing to respond well to cues telling her to point her toes to help step, maintain lateral stepping positioning Verbal cueing throughout for increased upright posture, sequencing to allow enough room for opposite LE on opposite side of noodle Pt with improved ability to step laterally with external cueing & increased repetitions  Gait with RW, ~85' minA for facilitation of weight shift onto RLE, intermittent assist for navigation of RW Verbal cueing throughout for increased upright posture, increased step length, & maintaining proximity to RW  Pt completing ~5x sit <> stand throughout session with minA, progressing to CGA for final 2 repetitions. Continuing to benefit from verbal cueing for hand placement.   Pt benefiting from encouragement, education on progress throughout session.      PATIENT EDUCATION: Education details: None specific today; heavy cues for motor form, pt not able to maintain fo rlon gdu eto memory impairment.  Person educated: Patient and Caregiver [name] Education method: Explanation Education comprehension: verbalized understanding and needs further education  HOME EXERCISE PROGRAM: Access Code: [email or web address] URL: [email or web address] Date: 12/20/2023 Prepared by: Connell Kiss   Exercises - Sit to Stand with Armchair  - 1 x daily - 7 x weekly - 3 sets - 5 reps - Standing March with Counter Support  - 1 x daily - 7 x  weekly - 2 sets - 10 reps - Supine Bridge  - 1 x daily - 7 x weekly - 2 sets - 10 reps - Supine Heel Slide with Strap  - 1 x daily - 7 x weekly - 2 sets - 10 reps - Supine Hip Abduction AROM  - 1 x daily - 7 x weekly - 3 sets - 10 reps - Seated Long Arc Quad  - 1 x daily - 7 x weekly - 3 sets - 10 reps - Seated March  - 1 x daily - 7 x weekly - 3 sets - 10 reps - 2 hold - Seated Hip Abduction with Resistance  - 1 x daily - 7 x weekly - 2 sets - 10 reps   GOALS: Goals reviewed with patient? Yes  SHORT TERM GOALS: Target date: 07/11/2024  Patient will be independent in home exercise program to improve strength/mobility for better functional independence with ADLs.  Baseline: need to initiate 05/14/2024: Caregiver states engaging patient in this as much as possible Goal status: MET  LONG TERM GOALS: Target date: 08/22/2024  Patient will perform bed mobility with consistently no more than min A utilizing bed features/supports as needed and with no more than than min cuing from aide/caregiver.  Baseline: requires up to mod A and mod/max verbal cuing 05/14/2024: still requiring mod A and mod cuing with caregiver reporting pt having increased leg pain in the morning impacting progress with this 05/30/2024: still requiring mod A and mod cuing, pt continues to have R LE pain in the mornings impacting independence with this activity 06/25/2024: still requiring mod A and mod cuing, caregiver reports pt's independence with this is impacted by fatigue at night and R LE pain in AM Goal status: IN PROGRESS  2.  Patient will increase PsFS score to equal to or greater than and average of 2 points to demonstrate statistically significant improvement in mobility and  quality of life.  Baseline: 3.667 05/14/2024: 4.66 05/30/2024:  5.33 06/25/2024: 6 Goal status: IN PROGRESS  3.  Patient (> 43 years old) will complete five times sit to stand test in < 30 seconds indicating an increased LE strength and improved  balance.  Baseline: 83 seconds from her transport chair with RW in front of her and min progressed to light mod A  05/14/2024: 43.34 seconds from her transport chair with RW in front of her and min progressed to light mod A  05/30/2024: 36.93 seconds from transport chair to RW with min progressed to light mod A (on 2nd trial) 06/25/2024: 42.25 seconds using RW from her personal transport chair - only skilled min A for lifting to stand throughout! Limited by R thigh pain Goal status: IN PROGRESS   4.  Patient will reduce timed up and go to <60 sec seconds to reduce fall risk and demonstrate improved transfer/gait ability.  Baseline: 34min54 seconds using RW with min A  05/14/2024: 35min57 seconds using RW with min A  05/30/2024:  2 min 2 seconds using RW with skilled min A for lifting to stand and balance/AD management during gait 06/25/2024: 2 min and 3 seconds using RW with skilled light min A for steadying - only verbal cuing for AD management Goal status: IN PROGRESS  5.  Patient will increase 10 meter walk test by 0.11m/s as to improve gait speed for better household level ambulation and to reduce fall risk.  Baseline: 0.061m/s with RW  05/14/2024: 0.085 m/s using RW with min A 05/30/2024: 0.107 m/s using RW with skilled min A  06/25/2024:  0.0868 m/s using RW with skilled min A for steadying - speed impacted by R LE thigh pain Goal status: IN PROGRESS   ASSESSMENT:  CLINICAL IMPRESSION:     *** Patient arrives motivated to participate in today's session. Pt able to tolerate increased gait distances, both in // bars & with use of RW, this date with minA for facilitation of weight shift onto RLE compared to previous sessions. Pt continuing to present with increased forward flexed posture & decreased step length R > L, improving with verbal cueing for increased upright posture & step length. Pt also able to complete lateral stepping over increased obstacle height compared to previous sessions; improved  sequencing for activity with increased repetitions & verbal, external cueing. Pt without verbal or nonverbal indications of increased pain throughout session. Patient will benefit from skilled physical therapy intervention to reduce deficits and impairments identified in evaluation, in order to reduce pain, improve quality of life, and maximize activity tolerance for ADL, IADL, and leisure/fitness. Physical therapy will help pt achieve long and short term goals of care.     OBJECTIVE IMPAIRMENTS: Abnormal gait, decreased activity tolerance, decreased balance, decreased cognition, decreased coordination, decreased endurance, decreased knowledge of condition, decreased knowledge of use of DME, decreased mobility, difficulty walking, decreased ROM, decreased strength, impaired flexibility, impaired sensation, impaired UE functional use, and pain.   ACTIVITY LIMITATIONS: carrying, lifting, sitting, standing, transfers, bed mobility, continence, bathing, toileting, dressing, self feeding, reach over head, hygiene/grooming, and locomotion level  PARTICIPATION LIMITATIONS: meal prep, cleaning, laundry, shopping, and community activity  PERSONAL FACTORS: Age, Time since onset of injury/illness/exacerbation, and 3+ comorbidities: hypothyroidism, recurrent UTIs, osteoarthritis, prior anterior trochanter fracture, CKD stage IIIb, chronic systolic heart failure, dementia, hypertension, history of cardiac pacemaker, history of normal pressure hydrocephalus, overreactive bladder are also affecting patient's functional outcome.   REHAB POTENTIAL: Good  CLINICAL DECISION MAKING:  Evolving/moderate complexity  EVALUATION COMPLEXITY: Moderate  PLAN:  PT FREQUENCY: 1-2x/week  PT DURATION: 12 weeks  PLANNED INTERVENTIONS: 97164- PT Re-evaluation, 97750- Physical Performance Testing, 97110-Therapeutic exercises, 97530- Therapeutic activity, 97112- Neuromuscular re-education, 97535- Self Care, [id number]- Manual therapy,  [phone]- Gait training, [phone]- Canalith repositioning, Patient/Family education, Balance training, Stair training, Joint mobilization, Vestibular training, Visual/preceptual remediation/compensation, Cognitive remediation, DME instructions, Cryotherapy, Moist heat, and Biofeedback  PLAN FOR NEXT SESSIONS:  *** Gait training - in // bars, with use of RW Activities to promote increased upright posture Functional strengthening to improve transfers to minimize caregiver burden  1:07 PM, 07/18/24 Chiquita Silvan, SPT Physical Therapy Student - [place]  [facility]

## 2024-07-18 NOTE — Telephone Encounter (Signed)
 Called to discuss missed appointment. Spoke with patient's caregiver, stated that she had forgotten to cancel today's appointment. Caregiver also stated that patient would be going to the beach next week, so she would like to cancel appointments currently scheduled on 07/23/24 & 07/25/24. Plan to return to PT on 07/30/24 at 1:15pm.

## 2024-07-23 ENCOUNTER — Ambulatory Visit: Admitting: Physical Therapy

## 2024-07-23 ENCOUNTER — Ambulatory Visit

## 2024-07-25 ENCOUNTER — Ambulatory Visit: Admitting: Physical Therapy

## 2024-07-30 ENCOUNTER — Ambulatory Visit

## 2024-07-30 ENCOUNTER — Ambulatory Visit: Admitting: Physical Therapy

## 2024-07-30 DIAGNOSIS — R278 Other lack of coordination: Secondary | ICD-10-CM

## 2024-07-30 DIAGNOSIS — R269 Unspecified abnormalities of gait and mobility: Secondary | ICD-10-CM

## 2024-07-30 DIAGNOSIS — R5381 Other malaise: Secondary | ICD-10-CM

## 2024-07-30 DIAGNOSIS — R2681 Unsteadiness on feet: Secondary | ICD-10-CM

## 2024-07-30 DIAGNOSIS — M6281 Muscle weakness (generalized): Secondary | ICD-10-CM

## 2024-07-30 DIAGNOSIS — R262 Difficulty in walking, not elsewhere classified: Secondary | ICD-10-CM

## 2024-07-30 NOTE — Therapy (Signed)
 OUTPATIENT PHYSICAL THERAPY TREATMENT   Patient Name: Kerri [name] MRN: [id number] DOB:05/28/1931, 89 y.o., female Today's Date: 07/30/2024   PCP: Cyrus Selinda Moose, PA-C  REFERRING PROVIDER: Cyrus Selinda Moose, PA-C   END OF SESSION:   PT End of Session - 07/30/24 1316     Visit Number 25    Number of Visits 48    Date for Recertification  08/22/24    Authorization Type [employer] auth#: [id number] for 3 PT vst from 10/20-11/17    Authorization Time Period 3 PT vst from 10/20-11/17    Authorization - Visit Number 1    Authorization - Number of Visits 3    Progress Note Due on Visit 20    PT Start Time 1315    PT Stop Time 1402    PT Time Calculation (min) 47 min    Equipment Utilized During Treatment Gait belt    Activity Tolerance Patient tolerated treatment well;No increased pain    Behavior During Therapy WFL for tasks assessed/performed              Past Medical History:  Diagnosis Date   Arthritis    Chronic systolic heart failure (HCC)    CKD (chronic kidney disease), stage III (HCC)    Dementia (HCC)    Hypertension    Hypothyroidism    Overactive bladder    Past Surgical History:  Procedure Laterality Date   INTRAMEDULLARY (IM) NAIL INTERTROCHANTERIC Left 09/06/2022   Procedure: INTRAMEDULLARY (IM) NAIL INTERTROCHANTERIC;  Surgeon: Tobie Priest, MD;  Location: [facility];  Service: Orthopedics;  Laterality: Left;   PACEMAKER LEADLESS INSERTION N/A 01/07/2021   Procedure: PACEMAKER LEADLESS INSERTION;  Surgeon: Ammon Blunt, MD;  Location: [facility];  Service: Cardiovascular;  Laterality: N/A;   PPM GENERATOR REMOVAL N/A 01/07/2021   Procedure: PPM GENERATOR REMOVAL;  Surgeon: Ammon Blunt, MD;  Location: [facility];  Service: Cardiovascular;  Laterality: N/A;   Patient Active Problem List   Diagnosis Date Noted   AKI (acute kidney injury) 01/12/2024   Acute postoperative anemia due to expected blood loss  09/07/2022   Displaced intertrochanteric fracture of left femur, initial encounter for closed fracture (HCC) 09/05/2022   Orthostatic hypotension 07/12/2022   Diarrhea 07/10/2022   COVID-19 virus infection 07/08/2022   Electrolyte abnormality 07/08/2022   Malnutrition of moderate degree 07/08/2022   Acute delirium 07/08/2022   History of recurrent UTI (urinary tract infection) 01/30/2022   Constipation    UTI (urinary tract infection) 10/15/2021   Elevated brain natriuretic peptide (BNP) level 10/15/2021   Generalized weakness 10/15/2021   Chronic kidney disease, stage 3b (HCC) 10/15/2021   Normal pressure hydrocephalus (HCC) 10/15/2021   Dementia without behavioral disturbance (HCC) 10/15/2021   Hypothyroidism 10/15/2021   Essential hypertension 10/15/2021   Overactive bladder 10/15/2021   Moderate mitral regurgitation 01/13/2021   Status post placement of cardiac pacemaker 01/13/2021   CHB (complete heart block) (HCC) 01/07/2021   Chronic systolic CHF (congestive heart failure) (HCC) 12/17/2020   Postmenopausal osteoporosis 08/26/2016   Mixed Alzheimer's and vascular dementia (HCC) 06/13/2014    ONSET DATE: Progressive decline over ~1-1.5 years  REFERRING DIAG:  M51.369 (ICD-10-CM) - Other intervertebral disc degeneration, lumbar region without mention of lumbar back pain or lower extremity pain  R53.81 (ICD-10-CM) - Other malaise  M62.81 (ICD-10-CM) - Muscle weakness (generalized)    THERAPY DIAG:  Muscle weakness (generalized)  Difficulty in walking, not elsewhere classified  Abnormality of gait and mobility  Debility  Unsteadiness on feet  Other lack of coordination  Rationale for Evaluation and Treatment: Rehabilitation  SUBJECTIVE:                                                                                                                                                                                             SUBJECTIVE STATEMENT:   Pt states she  has been doing alright, not fabulous, but alright. [name], caregiver, reports patient was not having a good Monday last week and then they were busy the rest of the week and the patient was too fatigued to attend her other therapy appointments.  [name], caregiver, reports patient has walked some at home and walked some at the beach using her RW. Caregiver reports patient is also going to the beach this Friday. Discussed POC moving forward with patient and caregiver to do 1visit/week for 3 weeks given patient only approved for 3 additional visits from insurance at this time. Patient nor caregiver mentions LE pain during session. No other updates reported.   From prior sessions:  07/11/2024: Inquired about prior TFESI, caregiver reporting only provided short term relief & injection itself was pretty painful, so did not deem to be overall beneficial.    PERTINENT HISTORY:  PMH: hypothyroidism, recurrent UTIs, osteoarthritis, prior anterior trochanter fracture, CKD stage IIIb, chronic systolic heart failure, dementia, hypertension, history of cardiac pacemaker, history of normal pressure hydrocephalus, overreactive bladder    PAIN:  Are you having pain? No  PRECAUTIONS: Fall and ICD/Pacemaker  WEIGHT BEARING RESTRICTIONS: No  FALLS: Has patient fallen in last 6 months? No  LIVING ENVIRONMENT: Lives with: caregiver 7 days a week. Alone for an hour or 2 a day, but only when alseep.   Lives in: House/apartment Stairs: Yes: Internal: 1 but ramp in place steps; none and External: 1 steps; none Has following equipment at home: Walker - 2 wheeled; transport chair; shower chair and grab bars  PLOF: Needs assistance with ADLs, Needs assistance with gait, Needs assistance with transfers, and Needs assistance with ambulation using RW - Primarily uses transport chair for mobility, but will ambulate with assistance to/from bathroom using RW in the home  PATIENT GOALS: Help her walking be easier and help her  get stronger   OBJECTIVE:  Note: Objective measures were completed at Evaluation unless otherwise noted.  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 Symptoms include leg pain, particularly in the right leg, and dragging of the leg during ambulation.  Right leg pain with inability to pick up feet (right>left - likely from ventriculomegaly +  peripheral neuropathy) patient with history of lumbar disc disease with stenosis - reports improvement during recent trial of prednisone. Suspicious for lumbar Disc Disease in addition to decondition  COGNITION: Overall cognitive status: History of cognitive impairments - at baseline   LOWER EXTREMITY MMT:    MMT Right Eval Left Eval  Hip flexion 3+ 3-  Knee flexion 4- 4-  Knee extension 4- 4-  Ankle dorsiflexion 4- 3-  Ankle plantarflexion 3+ 4-  (Blank rows = not tested)                                                                                                                              TREATMENT DATE: 07/30/2024    Pt arrives & leaves session in personal transport chair. Unless otherwise noted, at least CGA/min A provided & gait belt donned for patient safety.   In // bars performed the following gait and dynamic standing tasks to promote improved gait mechanics, increase upright posture, and improve B LE functional strength and balance:  Forward reciprocal gait - 3 laps B UE support on // bars throughout Light min A for balance As she fatigues, she has progressively more trunk/hip/knee flexion for more crouched posture Facilitation for weight shifting onto stance limbs, cuing for increased step lengths bilaterally and for upright posture Standing alternating foot taps to brown step with Blaze Pod target 2.5lb AWs on B LEs 2 colored blaze pods: R = red & L = blue to improve sequencing Continues to  collapse through L hip with trendelenburg when trying to lift R LE onto step Has increased difficult lifting R LE compared to L LE With fatigue, requires mod/max A to lift R LE approximately 5x each trial below  2x 74min30sec achieving 23 hits and 26 hits Lateral foot taps over 1/2 foam rolls to Blaze Pods 1x 72min30sec Still wearing 2.5lb AWs on B LEs during 2 colored blaze pods: R = red & L = blue to improve sequencing Requires progressively increased assist from min>mod A during activity With fatigue, has increased L posterior lean due to L hip weakness resulting in trendelenburg with trunk/hip flexion This was much more challenging for patient due to lateral step movements and due to fatigue towards end of session  Seated break between the above activities with pt initially requiring heavy min/light mod A to come to stand progressed to light min A during session - reinforced education to push up from armrests of seat and increase anterior trunk lean when rising to stand.  No indications of LE pain during session.    PATIENT EDUCATION: Education details: None specific today; heavy cues for motor form, pt not able to maintain fo rlon gdu eto memory impairment.  Person educated: Patient and Caregiver [name] Education method: Explanation Education comprehension: verbalized understanding and needs further education  HOME EXERCISE PROGRAM: Access Code: [email or web address] URL: [email or web address] Date: 12/20/2023 Prepared by: Connell Kiss   Exercises - Sit to Stand with Armchair  -  1 x daily - 7 x weekly - 3 sets - 5 reps - Standing March with Counter Support  - 1 x daily - 7 x weekly - 2 sets - 10 reps - Supine Bridge  - 1 x daily - 7 x weekly - 2 sets - 10 reps - Supine Heel Slide with Strap  - 1 x daily - 7 x weekly - 2 sets - 10 reps - Supine Hip Abduction AROM  - 1 x daily - 7 x weekly - 3 sets - 10 reps - Seated Long Arc Quad  - 1 x daily - 7 x weekly - 3 sets - 10 reps -  Seated March  - 1 x daily - 7 x weekly - 3 sets - 10 reps - 2 hold - Seated Hip Abduction with Resistance  - 1 x daily - 7 x weekly - 2 sets - 10 reps   GOALS: Goals reviewed with patient? Yes  SHORT TERM GOALS: Target date: 07/11/2024  Patient will be independent in home exercise program to improve strength/mobility for better functional independence with ADLs.  Baseline: need to initiate 05/14/2024: Caregiver states engaging patient in this as much as possible Goal status: MET   LONG TERM GOALS: Target date: 08/22/2024  Patient will perform bed mobility with consistently no more than min A utilizing bed features/supports as needed and with no more than than min cuing from aide/caregiver.  Baseline: requires up to mod A and mod/max verbal cuing 05/14/2024: still requiring mod A and mod cuing with caregiver reporting pt having increased leg pain in the morning impacting progress with this 05/30/2024: still requiring mod A and mod cuing, pt continues to have R LE pain in the mornings impacting independence with this activity 06/25/2024: still requiring mod A and mod cuing, caregiver reports pt's independence with this is impacted by fatigue at night and R LE pain in AM Goal status: IN PROGRESS  2.  Patient will increase PsFS score to equal to or greater than and average of 2 points to demonstrate statistically significant improvement in mobility and quality of life.  Baseline: 3.667 05/14/2024: 4.66 05/30/2024:  5.33 06/25/2024: 6 Goal status: IN PROGRESS  3.  Patient (> 43 years old) will complete five times sit to stand test in < 30 seconds indicating an increased LE strength and improved balance.  Baseline: 83 seconds from her transport chair with RW in front of her and min progressed to light mod A  05/14/2024: 43.34 seconds from her transport chair with RW in front of her and min progressed to light mod A  05/30/2024: 36.93 seconds from transport chair to RW with min progressed to light mod A  (on 2nd trial) 06/25/2024: 42.25 seconds using RW from her personal transport chair - only skilled min A for lifting to stand throughout! Limited by R thigh pain Goal status: IN PROGRESS   4.  Patient will reduce timed up and go to <60 sec seconds to reduce fall risk and demonstrate improved transfer/gait ability.  Baseline: 49min54 seconds using RW with min A  05/14/2024: 23min57 seconds using RW with min A  05/30/2024:  2 min 2 seconds using RW with skilled min A for lifting to stand and balance/AD management during gait 06/25/2024: 2 min and 3 seconds using RW with skilled light min A for steadying - only verbal cuing for AD management Goal status: IN PROGRESS  5.  Patient will increase 10 meter walk test by 0.24m/s as to improve gait  speed for better household level ambulation and to reduce fall risk.  Baseline: 0.057m/s with RW  05/14/2024: 0.085 m/s using RW with min A 05/30/2024: 0.107 m/s using RW with skilled min A  06/25/2024:  0.0868 m/s using RW with skilled min A for steadying - speed impacted by R LE thigh pain Goal status: IN PROGRESS   ASSESSMENT:  CLINICAL IMPRESSION:  Patient arrives motivated to participate in today's session. Therapy session continued to focus on interventions targeting improved gait mechanics, increased upright posture, improved B LE functional strength, and improved balance. Patient continues to demonstrate L hip weakness that becomes more prominent with fatigue causing trendelenburg posture, which causes increased L posterior lean/LOB and progression towards mod A for balance despite B UE support on // bars. Patient able to tolerate 3x 9min30second standing stepping activities while wearing 2.5lb AWs on B LEs today! Patient continues to demonstrate improving hip flexion strength to lift feet onto brown step to tap Blaze Pods with less frequent assist. Patient continues to demo increased weakness with lateral stepping, and more fatigue at end of session, to clear feet  over 1/2 foam rolls to tap [employer]. Pt without verbal nor nonverbal indications pain throughout session. Patient will benefit from skilled physical therapy intervention to reduce deficits and impairments identified in evaluation, in order to reduce pain, improve quality of life, and maximize activity tolerance for ADL, IADL, and leisure/fitness. Physical therapy will help pt achieve long and short term goals of care.     OBJECTIVE IMPAIRMENTS: Abnormal gait, decreased activity tolerance, decreased balance, decreased cognition, decreased coordination, decreased endurance, decreased knowledge of condition, decreased knowledge of use of DME, decreased mobility, difficulty walking, decreased ROM, decreased strength, impaired flexibility, impaired sensation, impaired UE functional use, and pain.   ACTIVITY LIMITATIONS: carrying, lifting, sitting, standing, transfers, bed mobility, continence, bathing, toileting, dressing, self feeding, reach over head, hygiene/grooming, and locomotion level  PARTICIPATION LIMITATIONS: meal prep, cleaning, laundry, shopping, and community activity  PERSONAL FACTORS: Age, Time since onset of injury/illness/exacerbation, and 3+ comorbidities: hypothyroidism, recurrent UTIs, osteoarthritis, prior anterior trochanter fracture, CKD stage IIIb, chronic systolic heart failure, dementia, hypertension, history of cardiac pacemaker, history of normal pressure hydrocephalus, overreactive bladder are also affecting patient's functional outcome.   REHAB POTENTIAL: Good  CLINICAL DECISION MAKING: Evolving/moderate complexity  EVALUATION COMPLEXITY: Moderate  PLAN:  PT FREQUENCY: 1-2x/week  PT DURATION: 12 weeks  PLANNED INTERVENTIONS: 97164- PT Re-evaluation, 97750- Physical Performance Testing, 97110-Therapeutic exercises, 97530- Therapeutic activity, 97112- Neuromuscular re-education, 97535- Self Care, [id number]- Manual therapy, [phone]- Gait training, [phone]- Canalith  repositioning, Patient/Family education, Balance training, Stair training, Joint mobilization, Vestibular training, Visual/preceptual remediation/compensation, Cognitive remediation, DME instructions, Cryotherapy, Moist heat, and Biofeedback  PLAN FOR NEXT SESSIONS:   Gait training - in // bars, with use of RW Lateral stepping over 1/2 foam rolls Continue use of 2.5lb AWs  Activities to promote increased upright posture Functional strengthening to improve transfers to minimize caregiver burden    Connell Kiss, PT, DPT, NCS, CSRS Physical Therapist - [facility]  [facility]  5:43 PM 07/30/24

## 2024-08-01 ENCOUNTER — Ambulatory Visit: Admitting: Physical Therapy

## 2024-08-06 ENCOUNTER — Ambulatory Visit: Admitting: Physical Therapy

## 2024-08-06 ENCOUNTER — Encounter

## 2024-08-08 ENCOUNTER — Ambulatory Visit: Admitting: Physical Therapy

## 2024-08-08 DIAGNOSIS — M6281 Muscle weakness (generalized): Secondary | ICD-10-CM

## 2024-08-08 DIAGNOSIS — R269 Unspecified abnormalities of gait and mobility: Secondary | ICD-10-CM

## 2024-08-08 DIAGNOSIS — R2681 Unsteadiness on feet: Secondary | ICD-10-CM

## 2024-08-08 DIAGNOSIS — R278 Other lack of coordination: Secondary | ICD-10-CM

## 2024-08-08 DIAGNOSIS — R262 Difficulty in walking, not elsewhere classified: Secondary | ICD-10-CM

## 2024-08-08 DIAGNOSIS — R5381 Other malaise: Secondary | ICD-10-CM

## 2024-08-08 NOTE — Therapy (Signed)
 OUTPATIENT PHYSICAL THERAPY TREATMENT   Patient Name: Kerri [name] MRN: [id number] DOB:22-Sep-1931, 89 y.o., female Today's Date: 08/08/2024   PCP: Cyrus Selinda Moose, PA-C  REFERRING PROVIDER: Cyrus Selinda Moose, PA-C   END OF SESSION:   PT End of Session - 08/08/24 1740     Visit Number 26    Number of Visits 48    Date for Recertification  08/22/24    Authorization Type [employer] auth#: [id number] for 3 PT vst from 10/20-11/17    Authorization Time Period 3 PT vst from 10/20-11/17    Authorization - Visit Number 2    Authorization - Number of Visits 3    Progress Note Due on Visit 20    PT Start Time 1315    PT Stop Time 1400    PT Time Calculation (min) 45 min    Equipment Utilized During Treatment Gait belt    Activity Tolerance Patient tolerated treatment well;No increased pain    Behavior During Therapy WFL for tasks assessed/performed               Past Medical History:  Diagnosis Date   Arthritis    Chronic systolic heart failure (HCC)    CKD (chronic kidney disease), stage III (HCC)    Dementia (HCC)    Hypertension    Hypothyroidism    Overactive bladder    Past Surgical History:  Procedure Laterality Date   INTRAMEDULLARY (IM) NAIL INTERTROCHANTERIC Left 09/06/2022   Procedure: INTRAMEDULLARY (IM) NAIL INTERTROCHANTERIC;  Surgeon: Tobie Priest, MD;  Location: [facility];  Service: Orthopedics;  Laterality: Left;   PACEMAKER LEADLESS INSERTION N/A 01/07/2021   Procedure: PACEMAKER LEADLESS INSERTION;  Surgeon: Ammon Blunt, MD;  Location: [facility];  Service: Cardiovascular;  Laterality: N/A;   PPM GENERATOR REMOVAL N/A 01/07/2021   Procedure: PPM GENERATOR REMOVAL;  Surgeon: Ammon Blunt, MD;  Location: [facility];  Service: Cardiovascular;  Laterality: N/A;   Patient Active Problem List   Diagnosis Date Noted   AKI (acute kidney injury) 01/12/2024   Acute postoperative anemia due to expected blood loss  09/07/2022   Displaced intertrochanteric fracture of left femur, initial encounter for closed fracture (HCC) 09/05/2022   Orthostatic hypotension 07/12/2022   Diarrhea 07/10/2022   COVID-19 virus infection 07/08/2022   Electrolyte abnormality 07/08/2022   Malnutrition of moderate degree 07/08/2022   Acute delirium 07/08/2022   History of recurrent UTI (urinary tract infection) 01/30/2022   Constipation    UTI (urinary tract infection) 10/15/2021   Elevated brain natriuretic peptide (BNP) level 10/15/2021   Generalized weakness 10/15/2021   Chronic kidney disease, stage 3b (HCC) 10/15/2021   Normal pressure hydrocephalus (HCC) 10/15/2021   Dementia without behavioral disturbance (HCC) 10/15/2021   Hypothyroidism 10/15/2021   Essential hypertension 10/15/2021   Overactive bladder 10/15/2021   Moderate mitral regurgitation 01/13/2021   Status post placement of cardiac pacemaker 01/13/2021   CHB (complete heart block) (HCC) 01/07/2021   Chronic systolic CHF (congestive heart failure) (HCC) 12/17/2020   Postmenopausal osteoporosis 08/26/2016   Mixed Alzheimer's and vascular dementia (HCC) 06/13/2014    ONSET DATE: Progressive decline over ~1-1.5 years  REFERRING DIAG:  M51.369 (ICD-10-CM) - Other intervertebral disc degeneration, lumbar region without mention of lumbar back pain or lower extremity pain  R53.81 (ICD-10-CM) - Other malaise  M62.81 (ICD-10-CM) - Muscle weakness (generalized)    THERAPY DIAG:  Muscle weakness (generalized)  Other lack of coordination  Difficulty in walking, not elsewhere classified  Abnormality of  gait and mobility  Debility  Unsteadiness on feet  Rationale for Evaluation and Treatment: Rehabilitation  SUBJECTIVE:                                                                                                                                                                                             SUBJECTIVE STATEMENT:  Pt reports she is  doing good today.  Pt's caregiver reporting that pt did well on recent beach trips when getting in/out of car, getting in shower with shower chair. Overall moving pretty good with no recent complaints about LE pain. No additional updates reported.     From prior sessions:  07/11/2024: Inquired about prior TFESI, caregiver reporting only provided short term relief & injection itself was pretty painful, so did not deem to be overall beneficial.    PERTINENT HISTORY:  PMH: hypothyroidism, recurrent UTIs, osteoarthritis, prior anterior trochanter fracture, CKD stage IIIb, chronic systolic heart failure, dementia, hypertension, history of cardiac pacemaker, history of normal pressure hydrocephalus, overreactive bladder    PAIN:  Are you having pain? No  PRECAUTIONS: Fall and ICD/Pacemaker  WEIGHT BEARING RESTRICTIONS: No  FALLS: Has patient fallen in last 6 months? No  LIVING ENVIRONMENT: Lives with: caregiver 7 days a week. Alone for an hour or 2 a day, but only when alseep.   Lives in: House/apartment Stairs: Yes: Internal: 1 but ramp in place steps; none and External: 1 steps; none Has following equipment at home: Walker - 2 wheeled; transport chair; shower chair and grab bars  PLOF: Needs assistance with ADLs, Needs assistance with gait, Needs assistance with transfers, and Needs assistance with ambulation using RW - Primarily uses transport chair for mobility, but will ambulate with assistance to/from bathroom using RW in the home  PATIENT GOALS: Help her walking be easier and help her get stronger   OBJECTIVE:  Note: Objective measures were completed at Evaluation unless otherwise noted.  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 Symptoms include leg pain, particularly in the right leg, and dragging of the leg during ambulation.  Right leg pain  with inability to pick up feet (right>left - likely from ventriculomegaly + peripheral neuropathy) patient with history of lumbar disc disease with stenosis - reports improvement during recent trial of prednisone. Suspicious for lumbar Disc Disease in addition to decondition  COGNITION: Overall cognitive status: History of cognitive impairments - at baseline   LOWER EXTREMITY MMT:    MMT Right Eval Left Eval  Hip flexion 3+ 3-  Knee flexion 4- 4-  Knee extension 4-  4-  Ankle dorsiflexion 4- 3-  Ankle plantarflexion 3+ 4-  (Blank rows = not tested)                                                                                                                              TREATMENT DATE: 08/08/2024    Pt arrives & leaves session in personal transport chair. Unless otherwise noted, at least CGA/min A provided & gait belt donned for patient safety.   In // bars performed the following gait and dynamic standing tasks to promote improved gait mechanics, increase upright posture, and improve B LE functional strength and balance:  Forward reciprocal gait - 3 laps B UE support on // bars throughout Light min A for balance Overall, pt with increased L hip drop & increased forward trunk lean throughout session.  Facilitation for weight shifting onto stance limbs, cuing for increased step lengths bilaterally and for upright posture Standing alternating foot taps to brown step with Blaze Pod target 1x 5 min bout 2 colored blaze pods: pink = target, yellow = distraction  Continues to collapse through L hip with trendelenburg when trying to lift R LE onto step Has increased difficult lifting R LE compared to L LE With fatigue, requires mod/max A to lift R LE approximately 4x at end of bout Gait with use of RW, min-modA for balance & facilitation of increased weight shift, steadying of RW First bout, pt completing ~10' before electing to take seated rest break; pt reporting some fear with  activity, as she had not done it in a while since being at the beach.  Second bout, pt completing ~15' before seated rest break; pt reporting some continued fear with activity & fatigue.    Seated break between the above activities with pt initially requiring heavy min/light mod A to come to stand progressed to light min A during session - reinforced education to push up from armrests of seat and increase anterior trunk lean when rising to stand.  No indications of LE pain during session.    PATIENT EDUCATION: Education details: None specific today; heavy cues for motor form, pt not able to maintain fo rlon gdu eto memory impairment.  Person educated: Patient and Caregiver [name] Education method: Explanation Education comprehension: verbalized understanding and needs further education  HOME EXERCISE PROGRAM: Access Code: [email or web address] URL: [email or web address] Date: 12/20/2023 Prepared by: Connell Kiss   Exercises - Sit to Stand with Armchair  - 1 x daily - 7 x weekly - 3 sets - 5 reps - Standing March with Counter Support  - 1 x daily - 7 x weekly - 2 sets - 10 reps - Supine Bridge  - 1 x daily - 7 x weekly - 2 sets - 10 reps - Supine Heel Slide with Strap  - 1 x daily - 7 x weekly - 2 sets - 10 reps - Supine Hip Abduction AROM  - 1 x daily - 7 x weekly -  3 sets - 10 reps - Seated Long Arc Quad  - 1 x daily - 7 x weekly - 3 sets - 10 reps - Seated March  - 1 x daily - 7 x weekly - 3 sets - 10 reps - 2 hold - Seated Hip Abduction with Resistance  - 1 x daily - 7 x weekly - 2 sets - 10 reps   GOALS: Goals reviewed with patient? Yes  SHORT TERM GOALS: Target date: 07/11/2024  Patient will be independent in home exercise program to improve strength/mobility for better functional independence with ADLs.  Baseline: need to initiate 05/14/2024: Caregiver states engaging patient in this as much as possible Goal status: MET   LONG TERM GOALS: Target date:  08/22/2024  Patient will perform bed mobility with consistently no more than min A utilizing bed features/supports as needed and with no more than than min cuing from aide/caregiver.  Baseline: requires up to mod A and mod/max verbal cuing 05/14/2024: still requiring mod A and mod cuing with caregiver reporting pt having increased leg pain in the morning impacting progress with this 05/30/2024: still requiring mod A and mod cuing, pt continues to have R LE pain in the mornings impacting independence with this activity 06/25/2024: still requiring mod A and mod cuing, caregiver reports pt's independence with this is impacted by fatigue at night and R LE pain in AM Goal status: IN PROGRESS  2.  Patient will increase PsFS score to equal to or greater than and average of 2 points to demonstrate statistically significant improvement in mobility and quality of life.  Baseline: 3.667 05/14/2024: 4.66 05/30/2024:  5.33 06/25/2024: 6 Goal status: IN PROGRESS  3.  Patient (> 44 years old) will complete five times sit to stand test in < 30 seconds indicating an increased LE strength and improved balance.  Baseline: 83 seconds from her transport chair with RW in front of her and min progressed to light mod A  05/14/2024: 43.34 seconds from her transport chair with RW in front of her and min progressed to light mod A  05/30/2024: 36.93 seconds from transport chair to RW with min progressed to light mod A (on 2nd trial) 06/25/2024: 42.25 seconds using RW from her personal transport chair - only skilled min A for lifting to stand throughout! Limited by R thigh pain Goal status: IN PROGRESS   4.  Patient will reduce timed up and go to <60 sec seconds to reduce fall risk and demonstrate improved transfer/gait ability.  Baseline: 65min54 seconds using RW with min A  05/14/2024: 72min57 seconds using RW with min A  05/30/2024:  2 min 2 seconds using RW with skilled min A for lifting to stand and balance/AD management during  gait 06/25/2024: 2 min and 3 seconds using RW with skilled light min A for steadying - only verbal cuing for AD management Goal status: IN PROGRESS  5.  Patient will increase 10 meter walk test by 0.33m/s as to improve gait speed for better household level ambulation and to reduce fall risk.  Baseline: 0.030m/s with RW  05/14/2024: 0.085 m/s using RW with min A 05/30/2024: 0.107 m/s using RW with skilled min A  06/25/2024:  0.0868 m/s using RW with skilled min A for steadying - speed impacted by R LE thigh pain Goal status: IN PROGRESS   ASSESSMENT:  CLINICAL IMPRESSION:  Patient arrives motivated to participate in today's session. Therapy session continued to focus on interventions targeting improved gait mechanics, increased upright posture,  improved B LE functional strength, and improved balance. Overall, more pronounced trendelenburg posture & forward flexed posture throughout session, causing increased L posterior lean/LOB and progression towards mod A for balance despite B UE support on // bars that worsened with fatigue. Patient able to tolerate 1x standing stepping activities this date.  Patient continues to demonstrate improving hip flexion strength to lift feet onto brown step to tap Blaze Pods with less frequent assist. Pt with some limited tolerance to gait with RW this date compared to previous sessions. Pt without verbal nor nonverbal indications pain throughout session. Patient will benefit from skilled physical therapy intervention to reduce deficits and impairments identified in evaluation, in order to reduce pain, improve quality of life, and maximize activity tolerance for ADL, IADL, and leisure/fitness. Physical therapy will help pt achieve long and short term goals of care.     OBJECTIVE IMPAIRMENTS: Abnormal gait, decreased activity tolerance, decreased balance, decreased cognition, decreased coordination, decreased endurance, decreased knowledge of condition, decreased  knowledge of use of DME, decreased mobility, difficulty walking, decreased ROM, decreased strength, impaired flexibility, impaired sensation, impaired UE functional use, and pain.   ACTIVITY LIMITATIONS: carrying, lifting, sitting, standing, transfers, bed mobility, continence, bathing, toileting, dressing, self feeding, reach over head, hygiene/grooming, and locomotion level  PARTICIPATION LIMITATIONS: meal prep, cleaning, laundry, shopping, and community activity  PERSONAL FACTORS: Age, Time since onset of injury/illness/exacerbation, and 3+ comorbidities: hypothyroidism, recurrent UTIs, osteoarthritis, prior anterior trochanter fracture, CKD stage IIIb, chronic systolic heart failure, dementia, hypertension, history of cardiac pacemaker, history of normal pressure hydrocephalus, overreactive bladder are also affecting patient's functional outcome.   REHAB POTENTIAL: Good  CLINICAL DECISION MAKING: Evolving/moderate complexity  EVALUATION COMPLEXITY: Moderate  PLAN:  PT FREQUENCY: 1-2x/week  PT DURATION: 12 weeks  PLANNED INTERVENTIONS: 97164- PT Re-evaluation, 97750- Physical Performance Testing, 97110-Therapeutic exercises, 97530- Therapeutic activity, 97112- Neuromuscular re-education, 97535- Self Care, [id number]- Manual therapy, [phone]- Gait training, [phone]- Canalith repositioning, Patient/Family education, Balance training, Stair training, Joint mobilization, Vestibular training, Visual/preceptual remediation/compensation, Cognitive remediation, DME instructions, Cryotherapy, Moist heat, and Biofeedback  PLAN FOR NEXT SESSIONS:   Potential discharge d/t insurance limitations  Gait training - in // bars, with use of RW Lateral stepping over 1/2 foam rolls Continue use of 2.5lb AWs  Activities to promote increased upright posture Functional strengthening to improve transfers to minimize caregiver burden    Chiquita Silvan, SPT Physical Therapy Student - [facility]  [facility]  5:40 PM 08/08/24

## 2024-08-09 NOTE — Progress Notes (Signed)
 Ref Provider: Dr. Caffaro, Kaitlin, PA PCP:  Cyrus Selinda Moose, PA Assessment and Plan:   In most patients we give written parts of assessment and plan to patient under Patient Instructions/After Visit Summary. So some parts are directed to patient.  Dear Ms. Dickey Mainland, It was our pleasure to participate in your care. We have typed up brief summary of what we discussed. 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 Agitation on lamotrigine 25 mg nightly, with fatigue at 15 mg. Currently on risperidone and levothyroxine . Memantine  was stopped due to poor renal function. Family reports improvement in mood but persistent confusion. Palliative care consult discussed for comfort-focused care.  - Continue risperidone and levothyroxine . - Discuss and consider palliative care consult with family and follow up via MyChart.  2. Fecal incontinence Three accidents today. Family reports variable control, with a period of no accidents for about a month. No clear connection identified between control and specific interventions. Family uses [employer] for gut health.  - Discuss with primary care physician for further evaluation and management.  3. Right leg pain with gait impairment Physical therapy has been beneficial, with noticeable improvement in mobility and function. Family reports significant improvement with therapy sessions.  - Provided a handwritten prescription for physical therapy after current sessions are completed. - Submitted electronic prescription for physical therapy to ensure insurance coverage. - Reorder Physical Therapy for balance and deconditioning  CONSIDERED COMORBIDITIES BELOW Chronic kidney disease  Recurrent urinary tract infections  6-8 months with Allyson Stallion, FNP-BC  Return in about 6 months (around 02/07/2025) for  Allyson Broody NP.  This note has been created using automated tools and reviewed for accuracy by [facility]. Interim History date 08/09/2024   Ms. Lucente is a 89 y.o. female here for treatment and evaluation of Memory Loss, accompanied by loved one.  Ms. Karam last visit was on 06/13/2024  Per loved one, patient is having more confusion, especially in orientation location. For example, the patient has forgotten where she is living even though she has lived their for 60+ years. Patient reports some agitation but denies sadness. Patient reports taking lamictal 25 mg nightly with no reported side effects. They tried 50 mg but the patient was having fatigue the following day.   History of Present Illness DONNIELLE ADDISON is a 89 year old female with mixed dementia who presents for follow-up of mood disturbance and fecal incontinence.  Cognitive impairment and neuropsychiatric symptoms - Increased confusion and poor orientation since June 13, 2024, including forgetting her home address despite living there for over sixty years - Agitation worsened after increasing lamotrigine from 15 mg to 25 mg nightly, which was done due to fatigue - Persistent confusion despite improvement in mood - Currently prescribed risperidone and levothyroxine  - Previously treated with memantine  (discontinued due to poor renal function) and trazodone   Mood disturbance - Mood has improved, described as more upbeat and happier - Physical therapy has contributed to improved mood and overall condition  Fecal incontinence - Fecal incontinence with three accidents on the day of the visit - One month without accidents while taking [employer], but accidents resumed today - History of refusing hygiene care  Mobility and physical therapy - Attending physical therapy, which has been beneficial for mobility and mood - Significant improvement in condition following therapy sessions - No recent  falls  Genitourinary symptoms - History of chronic kidney  disease and recurrent urinary tract infections - No recent urinary tract infection symptoms reported - Vaginal itching present  Nutritional status - Eating well  I reviewed labs, imaging, and notes in [employer], [facility], and from outside providers, if available.   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 Patient prescribed trimethoprim  for UTI. Medication appears to help with UTI and improved mental status: who had her first fall in 2012 or so where she broke her pelvis where she mostly tripped  over something.  she felt like since then she is not quite right with walking.  She had another fall November 25, 2013 when she broke her left shoulder.  In last 6 months or so, she is having significantly hard time getting up from sitting position or keeping her balance when she is walking.  She doesn't have any dizziness, wooziness, wobbly sensation or any tingling, numbness or burning in her feet.  The patient again fell December 06, 2013 hitting her head but she did not have concussion like symptoms.  She is getting Physical therapy/OT for it.  She is getting some Home Health, etc.  The patient is also having some significant issues with controlling her bladder since around early 2014 but has gotten significantly worse since beginning of 2015.  The patient mentioned her memory is not good since 2013 or so and it is progressively getting worse.  She does have previous history of depression.  She does not have any tremors Except she gets stressed..  She does not have any REM behavior disorder or loss of sense of smell or constipation.    Stopped driving in 7986. Physical  Therapy - helped little bit.  CT Brain (10/15/2021):  No acute intracranial hemorrhage or infarct.  No calvarial fracture. Advanced parenchymal atrophy, similar to prior examination. Moderate ventriculomegaly, unchanged. While this may simply represent the sequela of central atrophy, correlation for signs and symptoms of normal pressure hydrocephalus may be helpful.   MRI Brain 12/2014: Progressed volume loss in the brain since 2008, with chronic disproportionate but nonspecific cerebellar volume loss. Chronic ventricular prominence, favor ex vacuo in nature, but in the appropriate clinical setting normal pressure hydrocephalus could  not be excluded. Progressed and moderate for age nonspecific signal changes in the  brain, most commonly due to chronic small vessel disease.  Medications: Donepezil , Namenda , Seroquel, lamotrigine, trazodone , risperidone and levothyroxine   Episode of altered mental status on 10/12/2021- Patient was evaluated by the Emergency Room on 10/12/2021 after experiencing weakness and a fall.   ECHO 10/17/2021: Left ventricular ejection fraction, by estimation, is 45 to 50%. The left ventricle has mildly decreased function. The left ventricle demonstrates regional wall motion abnormalities (septal wall motion abnormality consistent with conduction abnormality). There is moderate left ventricular hypertrophy. Left ventricular diastolic parameters are consistent with Grade I diastolic dysfunction (impaired relaxation). Right ventricular systolic function is normal. The right ventricular size is normal. The mitral valve is normal in structure. Mild mitral valve regurgitation. No evidence of mitral stenosis. The aortic valve is normal in structure. Aortic valve regurgitation is not visualized. Aortic valve sclerosis is present, with no evidence of aortic valve stenosis. The inferior vena cava is normal in size with greater than 50% respiratory variability, suggesting right atrial pressure of 3  mmHg. Rhythm appears to be NSR with 2nd degree AV block type I   CT Head Without Contrast 10/15/2021: No acute intracranial hemorrhage or infarct.  No calvarial fracture. Advanced parenchymal atrophy, similar to prior examination. Moderate ventriculomegaly, unchanged. While this may simply represent the sequela of central atrophy, correlation for signs and symptoms of normal pressure hydrocephalus may be helpful.   CT Cervical Spine Without Contrast 10/12/2021: No acute abnormality of the cervical spine to account for the patient's symptoms. Severe multilevel degenerative disc disease and cervical spondylosis, similar to the prior study. No acute intracranial abnormalities. Severe cerebral and cerebellar atrophy with ex vacuo dilatation of the ventricular system and extensive chronic microvascular ischemic changes in the cerebral white matter, as above.   History of hypoactive delirium secondary to sepsis from UTI and dementia: Patient was admitted to Emergency Room July 2020 for altered mental status due to sepsis from severe UTI.   Insomnia: Taking Melatonin nightly  Imbalance: Using cane or walker for ambulation  Right leg pain with reports of leg dragging in patient with history of lumbar disc disease with stenosis - reports improvement during recent trial of prednisone. Suspicious for lumbar Disc Disease in addition to decondition with gait impairment Reviewed PCP note: Ongoing leg pain with intermittent weakness. On exam she does have some swelling around the right knee. Unsure if she could have some severe arthritis catching as she walks. Will obtain x-ray of the right knee today. Trial of prednisone 20 mg once a day for 5 days. We did discuss that she has some degenerative lumbar disc disease with stenosis and likely playing a role in right leg weakness. She has a pacemaker and we will defer MRI of the lumbar spine. Keep follow-up with neurology and if they feel it is necessary they could possibly  pursue nerve conduction study. Inform caretaker to help patient and stay by her side when walking.   Per loved one's report: US  of Right Lower Extremity (sometime between March 2023 and October 2023) showed no sign of DVT, but mild arthritic changes  - Previous (-) Lumbar Puncture trial at [facility] (01/09/14).  Concern for deconditioning   Advanced care planing- Patient is currently DNAR  I have documented the basis for this order (patient wishes) and consent from Dickey Mainland (self) and Donzell Mainland (daughter-in-law). This order has been entered into patient's electronic health record as of 12/15/2021.  Advanced care planning - Currently, patient is DNAR. Caretaker reports interests in changing status to FULL CODE. -discussed with caretaker, we will reach out to next of kin to discuss changes in advance care planning   Previous Visit documentation below - Discussed advanced care planning including healthcare power of attorney, financial power of attorney, living will, and code status.  - Discussed and provided a sample MOST (Medical Order for Scope of Treatment) form. If patient is interested, we can complete this form at a future visit.    - Discussed and filled out DNR (Do Not Resuscitate) form for Theresia Pree during visit on 3/7/22023.   In the event of cardiac and/or pulmonary arrest of the patient, efforts at cardiopulmonary resuscitation of the patient SHOULD NOT be initiated. This order does not affect other medically indicated comfort care.   I have documented the basis for this order (patient wishes) and consent from Dickey Mainland (self) and Donzell Mainland (daughter-in-law). This order has been entered into patient's electronic health record as of 12/15/2021.   Chronic kidney disease Affecting medication choices. Memantine  stopped due  to renal concerns.   Fecal incontinence Three accidents today (08/09/24). Family reports variable control, with a period of no accidents for about  a month. No clear connection identified between control and specific interventions. Family uses [employer] for gut health.  Physical Exam   Vitals Vitals:   08/09/24 1543  BP: 122/82  Pulse: 59  SpO2: 97%  Weight: 75.3 kg (166 lb)  Height: 170.2 cm (5' 7)  PainSc: 0-No pain   Body mass index is 26 kg/m.  Physical Exam    (Some of the exam changes noted are from previous clinical observations)  General Exam  Other Historical Findings Mild tenderness in left side muscles - palpable knot without warmth.  Favored to be muscular knot.  No swelling.  Right leg swelling improved post-therapy.  Neurological Exam   Other Historical Findings Disoriented to day and year.  Good upper body strength.  Able to lift legs, indicating adequate lower extremity strength.  12/15/2021: Correct: Weekday, home address, phone number (without area code), number of children, number grandchildren, name and occupation of grandchild Incorrect: Month, year, area code of phone number    Past Medical History:  Past Medical History:  Diagnosis Date  . Allergic rhinitis   . B12 deficiency   . Cardiac arrhythmia    s/p pacemaker  . Chronic rhinitis   . Chronic systolic CHF (congestive heart failure) (CMS/HHS-HCC) 12/17/2020  . CKD (chronic kidney disease) stage 3, GFR 30-59 ml/min (CMS-HCC)   . Complete heart block (CMS/HHS-HCC)   . Dementia (CMS-HCC)   . Depression 08/29/2014  . Depressive disorder, not elsewhere classified   . Fracture of pelvis (CMS/HHS-HCC) 01/2012   fell on concrete floor in basement  . Hematuria, microscopic 2003   negative CT and cystoscopy  . Hyperlipidemia   . Hypertension   . Hypothyroidism (acquired)   . Memory loss   . Normal pressure hydrocephalus (CMS/HHS-HCC)   . Osteoporosis, unspecified   . Other bursitis disorders   . Pelvic fracture (CMS/HHS-HCC)   . Pernicious anemia   . Pneumonia 01/2012   post admission for fracture  . Polyp of colon   . Skin  cancer   . Syncope   . Urinary incontinence     Past Surgical History:  Past Surgical History:  Procedure Laterality Date  . COLONOSCOPY  08/06/2010   PH Adenomatous Polyps, FH Colon Plyps (Mother/Sister): CBF 07/2015; No repeat due to age 82 per RTE (dw)  . Intramedullary nailing of Left femur with cephalomedullary device  09/06/2022   Dr. Tobie  . APPENDECTOMY    . bladder tack    . CATARACT EXTRACTION    . COLONOSCOPY  03/16/2006, 08/06/2002   Adenomatous Polyps, FH Colon Polyps (Mother/Sister)  . HYSTERECTOMY     with BSO  . INSERT / REPLACE / REMOVE PACEMAKER    . SKIN CANCER DESTRUCTION     Family History:  Family History  Problem Relation Name Age of Onset  . Osteoporosis (Thinning of bones) Mother    . Bone cancer Mother  68  . Colon polyps Mother    . No Known Problems Father    . Other Sister         memory loss  . Colon polyps Sister    . Other Son         major surgery on urethra   Social History:  Social History   Socioeconomic History  . Marital status: Widowed  . Number of children: 2  . Years  of education: 13  Tobacco Use  . Smoking status: Never  . Smokeless tobacco: Never  Vaping Use  . Vaping status: Never Used  Substance and Sexual Activity  . Alcohol use: No  . Drug use: No  . Sexual activity: Not Currently   Social Drivers of Health   Financial Resource Strain: Low Risk  (06/14/2023)   Overall Financial Resource Strain (CARDIA)   . Difficulty of Paying Living Expenses: Not hard at all  Food Insecurity: No Food Insecurity (01/13/2024)   [facility]   Hunger Vital Sign   . Within the past 12 months, you worried that your food would run out before you got the money to buy more.: Never true   . Within the past 12 months, the food you bought just didn't last and you didn't have money to get more.: Never true  Transportation Needs: No Transportation Needs (01/13/2024)   Received from [facility] - Transportation   .  Lack of Transportation (Medical): No   . Lack of Transportation (Non-Medical): No  Physical Activity: Insufficiently Active (05/01/2019)   Received from [facility]   Exercise Vital Sign   . On average, how many days per week do you engage in moderate to strenuous exercise (like a brisk walk)?: 7 days   . On average, how many minutes do you engage in exercise at this level?: 20 min  Stress: No Stress Concern Present (05/01/2019)   Received from [facility] - Occupational Stress Questionnaire   . Feeling of Stress : Not at all  Social Connections: Socially Isolated (01/13/2024)   Received from [facility]   Social Connection and Isolation Panel   . In a typical week, how many times do you talk on the phone with family, friends, or neighbors?: More than three times a week   . How often do you get together with friends or relatives?: Twice a week   . How often do you attend church or religious services?: Never   . Do you belong to any clubs or organizations such as church groups, unions, fraternal or athletic groups, or school groups?: No   . How often do you attend meetings of the clubs or organizations you belong to?: Never   . Are you married, widowed, divorced, separated, never married, or living with a partner?: Widowed  Housing Stability: Unknown (01/11/2024)   Housing Stability Vital Sign   . Unable to Pay for Housing in the Last Year: No   . Homeless in the Last Year: No   Allergies:  Allergies  Allergen Reactions  . Alendronate Sodium Other (See Comments)  . Bupropion Other (See Comments)    Tremors   . Rofecoxib Diarrhea and Other (See Comments)  . Alendronate Rash and Other (See Comments)   Medications: Current Outpatient Medications on File Prior to Visit  Medication Sig Dispense Refill  . acetaminophen  (TYLENOL ) 325 MG tablet Take 2 tablets (650 mg total) by mouth every 6 (six) hours as needed for mild pain, moderate pain or fever.     . bisacodyL  (DULCOLAX) 10 mg suppository Place 1 suppository (10 mg total) rectally daily as needed for moderate constipation.    . calcium  carbonate/vitamin D3 (CALTRATE 600 + D ORAL) Take 1 tablet by mouth once daily    . cranberry fruit (CRANBERRY) 450 mg Tab Take by mouth    . cyanocobalamin  (VITAMIN B12) 1,000 mcg/mL injection INJECT 1 ML INTO THE  MUSCLE MONTHLY 1 mL 5  . donepeziL  (ARICEPT ) 10 MG tablet TAKE 1 TABLET BY MOUTH DAILY 90 tablet 3  . ferrous fum-vit C-vit B12-FA 460-60-0.01-1 mg Cap once daily    . fluticasone  propionate (FLONASE ) 50 mcg/actuation nasal spray 2 sprays in both nostrils    . folic acid  (FOLVITE ) 1 MG tablet TAKE 1 TABLET BY MOUTH DAILY 90 tablet 1  . lamoTRIgine (LAMICTAL) 25 MG tablet Take 2 tablets (50 mg total) by mouth at bedtime for 360 days 180 tablet 3  . levothyroxine  (SYNTHROID ) 50 MCG tablet TAKE 1 TABLET BY MOUTH ONCE DAILY. TAKE ON EMPTY STOMACH WITH A GLASS OF WATER AT LEAST 30-60 MINUTES BEFORE BREAKFAST 90 tablet 3  . lidocaine  (LIDODERM ) 5 % patch Place 1 patch onto the skin daily for 180 days Apply patch to the most painful area for up to 12 hours in a 24 hour period. 30 patch 5  . metoprolol  SUCCinate (TOPROL -XL) 25 MG XL tablet TAKE 1 TABLET BY MOUTH ONCE DAILY. 90 tablet 3  . polyethylene glycol (MIRALAX ) powder Take by mouth    . potassium chloride  (KLOR-CON ) 10 MEQ ER tablet TAKE 1 TABLET BY MOUTH DAILY 30 tablet 2  . risedronate (ACTONEL) 150 MG tablet Take 1 tablet (150 mg total) by mouth every 30 (thirty) days Take with a full glass of water. Do not lie down for the next 30 min. 3 tablet 4  . Saccharomyces boulardii (FLORASTOR) 250 mg capsule Take 250 mg by mouth 2 (two) times daily    . traZODone  (DESYREL ) 50 MG tablet Take 1 tablet (50 mg total) by mouth at bedtime 30 tablet 11  . trospium  (SANCTURA  XR) 60 mg XR capsule TAKE 1 CAPSULE BY MOUTH EVERY DAY 90 capsule 1  . venlafaxine  (EFFEXOR -XR) 75 MG XR capsule TAKE 1 CAPSULE BY MOUTH  ONCE DAILY 90 capsule 3   No current facility-administered medications on file prior to visit.   Dr. Jannett Fairly

## 2024-08-13 ENCOUNTER — Encounter

## 2024-08-13 ENCOUNTER — Ambulatory Visit: Admitting: Physical Therapy

## 2024-08-15 ENCOUNTER — Ambulatory Visit: Attending: Family Medicine | Admitting: Physical Therapy

## 2024-08-15 DIAGNOSIS — R5381 Other malaise: Secondary | ICD-10-CM | POA: Insufficient documentation

## 2024-08-15 DIAGNOSIS — R269 Unspecified abnormalities of gait and mobility: Secondary | ICD-10-CM | POA: Diagnosis present

## 2024-08-15 DIAGNOSIS — R2681 Unsteadiness on feet: Secondary | ICD-10-CM | POA: Insufficient documentation

## 2024-08-15 DIAGNOSIS — R278 Other lack of coordination: Secondary | ICD-10-CM | POA: Insufficient documentation

## 2024-08-15 DIAGNOSIS — M6281 Muscle weakness (generalized): Secondary | ICD-10-CM | POA: Diagnosis present

## 2024-08-15 DIAGNOSIS — R262 Difficulty in walking, not elsewhere classified: Secondary | ICD-10-CM | POA: Diagnosis present

## 2024-08-15 NOTE — Therapy (Signed)
 OUTPATIENT PHYSICAL THERAPY TREATMENT   Patient Name: Kerri [name] MRN: [id number] DOB:22-Sep-1931, 89 y.o., female Today's Date: 08/15/2024   PCP: Cyrus Selinda Moose, PA-C  REFERRING PROVIDER: Cyrus Selinda Moose, PA-C   END OF SESSION:   PT End of Session - 08/15/24 1321     Visit Number 27    Number of Visits 48    Date for Recertification  08/22/24    Authorization Type [employer] auth#: [id number] for 3 PT vst from 10/20-11/17    Authorization Time Period 3 PT vst from 10/20-11/17    Authorization - Number of Visits 3    Progress Note Due on Visit 20    PT Start Time 1320    PT Stop Time 1400    PT Time Calculation (min) 40 min    Equipment Utilized During Treatment Gait belt    Activity Tolerance Patient tolerated treatment well;No increased pain    Behavior During Therapy WFL for tasks assessed/performed                Past Medical History:  Diagnosis Date   Arthritis    Chronic systolic heart failure (HCC)    CKD (chronic kidney disease), stage III (HCC)    Dementia (HCC)    Hypertension    Hypothyroidism    Overactive bladder    Past Surgical History:  Procedure Laterality Date   INTRAMEDULLARY (IM) NAIL INTERTROCHANTERIC Left 09/06/2022   Procedure: INTRAMEDULLARY (IM) NAIL INTERTROCHANTERIC;  Surgeon: Tobie Priest, MD;  Location: [facility];  Service: Orthopedics;  Laterality: Left;   PACEMAKER LEADLESS INSERTION N/A 01/07/2021   Procedure: PACEMAKER LEADLESS INSERTION;  Surgeon: Ammon Blunt, MD;  Location: [facility];  Service: Cardiovascular;  Laterality: N/A;   PPM GENERATOR REMOVAL N/A 01/07/2021   Procedure: PPM GENERATOR REMOVAL;  Surgeon: Ammon Blunt, MD;  Location: [facility];  Service: Cardiovascular;  Laterality: N/A;   Patient Active Problem List   Diagnosis Date Noted   AKI (acute kidney injury) 01/12/2024   Acute postoperative anemia due to expected blood loss 09/07/2022   Displaced  intertrochanteric fracture of left femur, initial encounter for closed fracture (HCC) 09/05/2022   Orthostatic hypotension 07/12/2022   Diarrhea 07/10/2022   COVID-19 virus infection 07/08/2022   Electrolyte abnormality 07/08/2022   Malnutrition of moderate degree 07/08/2022   Acute delirium 07/08/2022   History of recurrent UTI (urinary tract infection) 01/30/2022   Constipation    UTI (urinary tract infection) 10/15/2021   Elevated brain natriuretic peptide (BNP) level 10/15/2021   Generalized weakness 10/15/2021   Chronic kidney disease, stage 3b (HCC) 10/15/2021   Normal pressure hydrocephalus (HCC) 10/15/2021   Dementia without behavioral disturbance (HCC) 10/15/2021   Hypothyroidism 10/15/2021   Essential hypertension 10/15/2021   Overactive bladder 10/15/2021   Moderate mitral regurgitation 01/13/2021   Status post placement of cardiac pacemaker 01/13/2021   CHB (complete heart block) (HCC) 01/07/2021   Chronic systolic CHF (congestive heart failure) (HCC) 12/17/2020   Postmenopausal osteoporosis 08/26/2016   Mixed Alzheimer's and vascular dementia (HCC) 06/13/2014    ONSET DATE: Progressive decline over ~1-1.5 years  REFERRING DIAG:  M51.369 (ICD-10-CM) - Other intervertebral disc degeneration, lumbar region without mention of lumbar back pain or lower extremity pain  R53.81 (ICD-10-CM) - Other malaise  M62.81 (ICD-10-CM) - Muscle weakness (generalized)    THERAPY DIAG:  Muscle weakness (generalized)  Other lack of coordination  Difficulty in walking, not elsewhere classified  Abnormality of gait and mobility  Debility  Unsteadiness  on feet  Rationale for Evaluation and Treatment: Rehabilitation  SUBJECTIVE:                                                                                                                                                                                             SUBJECTIVE STATEMENT:   Pt reports she is doing fine  today.  Pt's caregiver reporting that Dr. Maree put in another order for PT. Pt's caregiver reporting no additional updates at this time.   Pt's caregiver reporting that she feels like it would be beneficial to continue with PT, as pt still needing to work on standing & walking with increased upright posture.     From prior sessions:  07/11/2024: Inquired about prior TFESI, caregiver reporting only provided short term relief & injection itself was pretty painful, so did not deem to be overall beneficial.    PERTINENT HISTORY:  PMH: hypothyroidism, recurrent UTIs, osteoarthritis, prior anterior trochanter fracture, CKD stage IIIb, chronic systolic heart failure, dementia, hypertension, history of cardiac pacemaker, history of normal pressure hydrocephalus, overreactive bladder    PAIN:  Are you having pain? No  PRECAUTIONS: Fall and ICD/Pacemaker  WEIGHT BEARING RESTRICTIONS: No  FALLS: Has patient fallen in last 6 months? No  LIVING ENVIRONMENT: Lives with: caregiver 7 days a week. Alone for an hour or 2 a day, but only when alseep.   Lives in: House/apartment Stairs: Yes: Internal: 1 but ramp in place steps; none and External: 1 steps; none Has following equipment at home: Walker - 2 wheeled; transport chair; shower chair and grab bars  PLOF: Needs assistance with ADLs, Needs assistance with gait, Needs assistance with transfers, and Needs assistance with ambulation using RW - Primarily uses transport chair for mobility, but will ambulate with assistance to/from bathroom using RW in the home  PATIENT GOALS: Help her walking be easier and help her get stronger   OBJECTIVE:  Note: Objective measures were completed at Evaluation unless otherwise noted.  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 Symptoms include leg pain, particularly in the right leg,  and dragging of the leg during ambulation.  Right leg pain with inability to pick up feet (right>left - likely from ventriculomegaly + peripheral neuropathy) patient with history of lumbar disc disease with stenosis - reports improvement during recent trial of prednisone. Suspicious for lumbar Disc Disease in addition to decondition  COGNITION: Overall cognitive status: History of cognitive impairments - at baseline   LOWER EXTREMITY MMT:    MMT Right Eval Left Eval  Hip flexion 3+ 3-  Knee flexion 4- 4-  Knee extension 4- 4-  Ankle dorsiflexion 4- 3-  Ankle plantarflexion 3+ 4-  (Blank rows = not tested)                                                                                                                              TREATMENT DATE: 08/15/2024    Pt arrives & leaves session in personal transport chair. Unless otherwise noted, at least CGA/min A provided & gait belt donned for patient safety.   In // bars performed the following gait and dynamic standing tasks to promote improved gait mechanics, increase upright posture, and improve B LE functional strength and balance:  Forward reciprocal gait - 3 laps (down & back length of //bars)  improved upright posture this date  B UE support on // bars throughout Intermittent light min A for balance; tactile cueing at R shoulder & L glute for facilitation of increased upright posture Overall, pt with increased upright posture & improvement in weight shift onto LLE this date  Lateral stepping  2x length of // bars with BUE support Continued to provide intermittent light minA for balance & facilitation for increased upright posture at R shoulder, L glute Pt benefiting from external targets for increased step length & looking forward at mirror for orientation to midline & increased upright posture Standing in // bars, alternating foot taps to brown step with Blaze Pod targets 1x 2.5 min bout, totaling 13 hits   Continues to collapse  through L hip with trendelenburg when trying to lift R LE onto step, but improvement noted since previous session Pt continuing to benefit from looking forward/up at mirror for increased upright posture with continued manual facilitation Continued to note increased difficult lifting R LE compared to L LE Gait with use of RW, min-modA for balance  pt completing 24' before electing to take seated rest break Pt not verbally expressing fear with activity this date; pt endorsing fatigue at end of session Pt requiring less assistance for management of RW this date   Seated break between the above activities with pt initially requiring heavy min/light mod A to come to stand progressed to light min A during session - reinforced education to push up from armrests of seat and increase anterior trunk lean when rising to stand.  No indications of LE pain during session.    PATIENT EDUCATION: Education details: None specific today; heavy cues for motor form, pt not able to maintain fo rlon gdu eto memory impairment.  Person educated: Patient and Caregiver [name] Education method: Explanation Education comprehension: verbalized understanding and needs further education  HOME EXERCISE PROGRAM: Access Code: [email or web address] URL: [email or web address] Date: 12/20/2023 Prepared by: Connell Kiss   Exercises - Sit to Stand with Armchair  - 1 x daily - 7 x weekly - 3 sets - 5 reps - Standing March with Counter Support  - 1 x daily - 7 x weekly - 2 sets -  10 reps - Supine Bridge  - 1 x daily - 7 x weekly - 2 sets - 10 reps - Supine Heel Slide with Strap  - 1 x daily - 7 x weekly - 2 sets - 10 reps - Supine Hip Abduction AROM  - 1 x daily - 7 x weekly - 3 sets - 10 reps - Seated Long Arc Quad  - 1 x daily - 7 x weekly - 3 sets - 10 reps - Seated March  - 1 x daily - 7 x weekly - 3 sets - 10 reps - 2 hold - Seated Hip Abduction with Resistance  - 1 x daily - 7 x weekly - 2 sets - 10  reps   GOALS: Goals reviewed with patient? Yes  SHORT TERM GOALS: Target date: 07/11/2024  Patient will be independent in home exercise program to improve strength/mobility for better functional independence with ADLs.  Baseline: need to initiate 05/14/2024: Caregiver states engaging patient in this as much as possible Goal status: MET   LONG TERM GOALS: Target date: 08/22/2024  Patient will perform bed mobility with consistently no more than min A utilizing bed features/supports as needed and with no more than than min cuing from aide/caregiver.  Baseline: requires up to mod A and mod/max verbal cuing 05/14/2024: still requiring mod A and mod cuing with caregiver reporting pt having increased leg pain in the morning impacting progress with this 05/30/2024: still requiring mod A and mod cuing, pt continues to have R LE pain in the mornings impacting independence with this activity 06/25/2024: still requiring mod A and mod cuing, caregiver reports pt's independence with this is impacted by fatigue at night and R LE pain in AM Goal status: IN PROGRESS  2.  Patient will increase PsFS score to equal to or greater than and average of 2 points to demonstrate statistically significant improvement in mobility and quality of life.  Baseline: 3.667 05/14/2024: 4.66 05/30/2024:  5.33 06/25/2024: 6 Goal status: IN PROGRESS  3.  Patient (> 68 years old) will complete five times sit to stand test in < 30 seconds indicating an increased LE strength and improved balance.  Baseline: 83 seconds from her transport chair with RW in front of her and min progressed to light mod A  05/14/2024: 43.34 seconds from her transport chair with RW in front of her and min progressed to light mod A  05/30/2024: 36.93 seconds from transport chair to RW with min progressed to light mod A (on 2nd trial) 06/25/2024: 42.25 seconds using RW from her personal transport chair - only skilled min A for lifting to stand throughout! Limited  by R thigh pain Goal status: IN PROGRESS   4.  Patient will reduce timed up and go to <60 sec seconds to reduce fall risk and demonstrate improved transfer/gait ability.  Baseline: 60min54 seconds using RW with min A  05/14/2024: 75min57 seconds using RW with min A  05/30/2024:  2 min 2 seconds using RW with skilled min A for lifting to stand and balance/AD management during gait 06/25/2024: 2 min and 3 seconds using RW with skilled light min A for steadying - only verbal cuing for AD management Goal status: IN PROGRESS  5.  Patient will increase 10 meter walk test by 0.74m/s as to improve gait speed for better household level ambulation and to reduce fall risk.  Baseline: 0.022m/s with RW  05/14/2024: 0.085 m/s using RW with min A 05/30/2024: 0.107 m/s using RW with skilled  min A  06/25/2024:  0.0868 m/s using RW with skilled min A for steadying - speed impacted by R LE thigh pain Goal status: IN PROGRESS   ASSESSMENT:  CLINICAL IMPRESSION:  Patient arrives motivated to participate in today's session. Therapy session continued to focus on interventions targeting improved gait mechanics, increased upright posture, improved B LE functional strength, and improved balance. Overall, noted improvement of upright posture with facilitation & verbal cueing. Pt also benefiting from looking forward at mirror. Pt with improvement in step length with lateral stepping with use of external target. Patient continues to demonstrate improving hip flexion strength to lift feet onto brown step to tap Blaze Pods with less frequent assist. Pt not verbalizing any fear with gait with use of RW this date; able to complete bout of gait without assistance for management of RW this date. Pt without verbal nor nonverbal indications pain throughout session. Patient will benefit from skilled physical therapy intervention to reduce deficits and impairments identified in evaluation, in order to reduce pain, improve quality of life, and  maximize activity tolerance for ADL, IADL, and leisure/fitness. Physical therapy will help pt achieve long and short term goals of care. Patient's condition has the potential to improve in response to therapy. Maximum improvement is yet to be obtained. However, pt may be limited in continued therapy due to insurance limitations.      OBJECTIVE IMPAIRMENTS: Abnormal gait, decreased activity tolerance, decreased balance, decreased cognition, decreased coordination, decreased endurance, decreased knowledge of condition, decreased knowledge of use of DME, decreased mobility, difficulty walking, decreased ROM, decreased strength, impaired flexibility, impaired sensation, impaired UE functional use, and pain.   ACTIVITY LIMITATIONS: carrying, lifting, sitting, standing, transfers, bed mobility, continence, bathing, toileting, dressing, self feeding, reach over head, hygiene/grooming, and locomotion level  PARTICIPATION LIMITATIONS: meal prep, cleaning, laundry, shopping, and community activity  PERSONAL FACTORS: Age, Time since onset of injury/illness/exacerbation, and 3+ comorbidities: hypothyroidism, recurrent UTIs, osteoarthritis, prior anterior trochanter fracture, CKD stage IIIb, chronic systolic heart failure, dementia, hypertension, history of cardiac pacemaker, history of normal pressure hydrocephalus, overreactive bladder are also affecting patient's functional outcome.   REHAB POTENTIAL: Good  CLINICAL DECISION MAKING: Evolving/moderate complexity  EVALUATION COMPLEXITY: Moderate  PLAN:  PT FREQUENCY: 1-2x/week  PT DURATION: 12 weeks  PLANNED INTERVENTIONS: 97164- PT Re-evaluation, 97750- Physical Performance Testing, 97110-Therapeutic exercises, 97530- Therapeutic activity, 97112- Neuromuscular re-education, 97535- Self Care, [id number]- Manual therapy, [phone]- Gait training, [phone]- Canalith repositioning, Patient/Family education, Balance training, Stair training, Joint mobilization,  Vestibular training, Visual/preceptual remediation/compensation, Cognitive remediation, DME instructions, Cryotherapy, Moist heat, and Biofeedback  PLAN FOR NEXT SESSIONS:   Gait training - in // bars, with use of RW Lateral stepping over 1/2 foam rolls Continue use of 2.5lb AWs  Activities to promote increased upright posture Functional strengthening to improve transfers to minimize caregiver burden    Chiquita Silvan, SPT Physical Therapy Student - [facility]  [facility]  6:07 PM 08/15/24

## 2024-08-20 ENCOUNTER — Encounter

## 2024-08-20 ENCOUNTER — Ambulatory Visit

## 2024-08-21 ENCOUNTER — Emergency Department (HOSPITAL_COMMUNITY): Admission: EM | Admit: 2024-08-21 | Discharge: 2024-08-21 | Disposition: A | Discharge status: Home / Self Care

## 2024-08-21 ENCOUNTER — Other Ambulatory Visit: Payer: Self-pay

## 2024-08-21 ENCOUNTER — Emergency Department (HOSPITAL_COMMUNITY)

## 2024-08-21 DIAGNOSIS — I129 Hypertensive chronic kidney disease with stage 1 through stage 4 chronic kidney disease, or unspecified chronic kidney disease: Secondary | ICD-10-CM | POA: Diagnosis not present

## 2024-08-21 DIAGNOSIS — Z79899 Other long term (current) drug therapy: Secondary | ICD-10-CM | POA: Diagnosis not present

## 2024-08-21 DIAGNOSIS — N3000 Acute cystitis without hematuria: Secondary | ICD-10-CM | POA: Diagnosis present

## 2024-08-21 DIAGNOSIS — F0390 Unspecified dementia without behavioral disturbance: Secondary | ICD-10-CM | POA: Insufficient documentation

## 2024-08-21 DIAGNOSIS — N189 Chronic kidney disease, unspecified: Secondary | ICD-10-CM | POA: Insufficient documentation

## 2024-08-21 LAB — CBC
HCT: 44.1 % (ref 36.0–46.0)
Hemoglobin: 14.2 g/dL (ref 12.0–15.0)
MCH: 28.8 pg (ref 26.0–34.0)
MCHC: 32.2 g/dL (ref 30.0–36.0)
MCV: 89.5 fL (ref 80.0–100.0)
Platelets: 255 K/uL (ref 150–400)
RBC: 4.93 MIL/uL (ref 3.87–5.11)
RDW: 13.5 % (ref 11.5–15.5)
WBC: 11.3 K/uL — ABNORMAL HIGH (ref 4.0–10.5)
nRBC: 0 % (ref 0.0–0.2)

## 2024-08-21 LAB — COMPREHENSIVE METABOLIC PANEL WITH GFR
ALT: 8 U/L (ref 0–44)
AST: 19 U/L (ref 15–41)
Albumin: 3.9 g/dL (ref 3.5–5.0)
Alkaline Phosphatase: 77 U/L (ref 38–126)
Anion gap: 11 (ref 5–15)
BUN: 32 mg/dL — ABNORMAL HIGH (ref 8–23)
CO2: 23 mmol/L (ref 22–32)
Calcium: 10.1 mg/dL (ref 8.9–10.3)
Chloride: 108 mmol/L (ref 98–111)
Creatinine, Ser: 1.57 mg/dL — ABNORMAL HIGH (ref 0.44–1.00)
GFR, Estimated: 31 mL/min — ABNORMAL LOW (ref >60)
Glucose, Bld: 92 mg/dL (ref 70–99)
Potassium: 4.2 mmol/L (ref 3.5–5.1)
Sodium: 142 mmol/L (ref 135–145)
Total Bilirubin: 0.3 mg/dL (ref 0.0–1.2)
Total Protein: 7.6 g/dL (ref 6.5–8.1)

## 2024-08-21 LAB — URINALYSIS, ROUTINE W REFLEX MICROSCOPIC
Bilirubin Urine: NEGATIVE
Glucose, UA: NEGATIVE mg/dL
Hgb urine dipstick: NEGATIVE
Ketones, ur: NEGATIVE mg/dL
Nitrite: POSITIVE — AB
Protein, ur: NEGATIVE mg/dL
Specific Gravity, Urine: 1.025 (ref 1.005–1.030)
WBC, UA: >50 WBC/hpf (ref 0–5)
pH: 5 (ref 5.0–8.0)

## 2024-08-21 LAB — CBG MONITORING, ED: Glucose-Capillary: 92 mg/dL (ref 70–99)

## 2024-08-21 LAB — RESP PANEL BY RT-PCR (RSV, FLU A&B, COVID)  RVPGX2
Influenza A by PCR: NEGATIVE
Influenza B by PCR: NEGATIVE
Resp Syncytial Virus by PCR: NEGATIVE
SARS Coronavirus 2 by RT PCR: NEGATIVE

## 2024-08-21 MED ORDER — FOSFOMYCIN TROMETHAMINE 3 G PO PACK
3.0000 g | PACK | Freq: Once | ORAL | Status: AC
  Administered 2024-08-21: 3 g via ORAL
  Filled 2024-08-21: qty 3

## 2024-08-21 MED ORDER — IOHEXOL 300 MG/ML  SOLN
75.0000 mL | Freq: Once | INTRAMUSCULAR | Status: AC | PRN
  Administered 2024-08-21: 75 mL via INTRAVENOUS

## 2024-08-21 NOTE — ED Triage Notes (Signed)
 Pt arrives via personal wheelchair accompanied by caregiver. Pt was more weak and confused this morning, with a poor appetite. Caregiver notes that the pt has a hx of UTIs, and this is the typical presentation. Pt has a hx of dementia.

## 2024-08-21 NOTE — Discharge Instructions (Signed)
 While you were in the emergency room, you diagnosed with a urinary tract infection.  You received an antibiotic here in the emergency room called fosfomycin.  You do not need to take any additional antibiotics, this should help clear up the infection.  Return to the emergency room if you develop fever, worsening confusion, vomiting, inability eat or drink.  Follow-up with your PCP within 1 week.

## 2024-08-21 NOTE — ED Provider Notes (Signed)
 [place] EMERGENCY DEPARTMENT AT [facility] Provider Note   CSN: [id number] Arrival date & time: 08/21/24  1212     Patient presents with: Altered Mental Status   Kerri [name] is a 89 y.o. female.  {Add pertinent medical, surgical, social history, OB history to HPI:347} 89 year old female with past medical history of dementia, chronic kidney disease, and hypertension presenting to the emergency department today with worsening confusion than normal.  Patient does have some dementia at baseline but today she did not want to eat or drink and was less active than normal.  She was brought to the emergency department for further evaluation regarding this.  Family states that the patient has had worsening abdominal distention over the past few days and has not had a normal bowel movement in the past 2 days which is abnormal for her.  She has not really been complaining of abdominal pain has not been eating or drinking much during that time.  She has been afebrile.  Family states that she has presented similarly in the past with urinary tract infections.  She states that she has been having some cloudy urine with some odor to it recently.   Altered Mental Status      Prior to Admission medications   Medication Sig Start Date End Date Taking? Authorizing Provider  acetaminophen  (TYLENOL ) 325 MG tablet Take 2 tablets (650 mg total) by mouth every 6 (six) hours as needed for mild pain, moderate pain or fever. Patient taking differently: Take 650 mg by mouth 2 (two) times daily. 10/23/21   Fausto Burnard LABOR, DO  bisacodyl  (DULCOLAX) 10 MG suppository Place 1 suppository (10 mg total) rectally daily as needed for moderate constipation. 09/10/22   Caleen Qualia, MD  chlorhexidine  (HIBICLENS ) 4 % external liquid Apply topically daily as needed. 06/17/22   Helon Kirsch A, PA-C  cyanocobalamin  (,VITAMIN B-12,) 1000 MCG/ML injection Inject 1,000 mcg into the muscle every 30 (thirty)  days. 11/11/21   [provider]  docusate sodium  (COLACE) 100 MG capsule Take 1 capsule (100 mg total) by mouth 2 (two) times daily. Patient taking differently: Take 100 mg by mouth daily as needed for mild constipation or moderate constipation. 09/10/22   Caleen Qualia, MD  donepezil  (ARICEPT ) 10 MG tablet Take 10 mg by mouth at bedtime.    [provider]  Fe Fum-Vit C-Vit B12-FA (TRIGELS-F FORTE) CAPS capsule Take 1 capsule by mouth 2 (two) times daily. 09/10/22   Amin, Sumayya, MD  fluticasone  (FLONASE ) 50 MCG/ACT nasal spray Place 1 spray into both nostrils daily as needed for allergies.    [provider]  folic acid  (FOLVITE ) 1 MG tablet Take 1 mg by mouth daily.    [provider]  levothyroxine  (SYNTHROID ) 50 MCG tablet Take 50 mcg by mouth daily before breakfast. 11/17/18   [provider]  memantine  (NAMENDA ) 10 MG tablet Take 10 mg by mouth 2 (two) times daily. 02/19/19   [provider]  metoprolol  succinate (TOPROL -XL) 25 MG 24 hr tablet Take 25 mg by mouth daily.    [provider]  Multiple Vitamin (MULTIVITAMIN WITH MINERALS) TABS tablet Take 1 tablet by mouth daily. 09/10/22   Amin, Sumayya, MD  polyethylene glycol powder (GLYCOLAX /MIRALAX ) 17 GM/SCOOP powder Take 17 g by mouth daily as needed for mild constipation or moderate constipation. 10/23/21   [provider]  potassium chloride  (KLOR-CON ) 10 MEQ tablet Take 10 mEq by mouth daily. Every Monday    [provider]  saccharomyces boulardii (FLORASTOR) 250 MG capsule Take 1 capsule (250 mg total) by mouth 2 (two) times daily. 01/17/24   Regalado, Belkys A, MD  Trospium  Chloride 60 MG CP24 Take 1 capsule (60 mg total) by mouth daily. 12/03/22   Francisca Redell BROCKS, MD  venlafaxine  XR (EFFEXOR -XR) 75 MG 24 hr capsule Take 75 mg by mouth daily with breakfast.  03/09/19   [provider]    Allergies: Alendronate sodium, Bupropion, Rofecoxib, and  Alendronate    Review of Systems  Reason unable to perform ROS: Dementia.  Gastrointestinal:  Positive for abdominal distention.  All other systems reviewed and are negative.   Updated Vital Signs BP (!) 174/112   Pulse 68   Temp 99 F (37.2 C) (Oral)   Resp 20   SpO2 99%   Physical Exam Vitals and nursing note reviewed.   Gen: NAD Eyes: PERRL, EOMI HEENT: no oropharyngeal swelling Neck: trachea midline, no meningismus Resp: clear to auscultation bilaterally Card: RRR, no murmurs, rubs, or gallops Abd: Distended with minimal tenderness Extremities: no calf tenderness, no edema Neuro: Cns intact, no focal deficits Vascular: 2+ radial pulses bilaterally, 2+ DP pulses bilaterally Skin: no rashes Psyc: acting appropriately   (all labs ordered are listed, but only abnormal results are displayed) Labs Reviewed  COMPREHENSIVE METABOLIC PANEL WITH GFR  CBC  URINALYSIS, ROUTINE W REFLEX MICROSCOPIC  CBG MONITORING, ED    EKG: None  Radiology: No results found.  {Document cardiac monitor, telemetry assessment procedure when appropriate:32947} Procedures   Medications Ordered in the ED - No data to display    {Click here for ABCD2, HEART and other calculators REFRESH Note before signing:1}                              Medical Decision Making 89 year old female with past medical history of dementia, CKD, and hypertension presenting to the emergency department today with some confusion compared to her baseline as well as abdominal distention this morning.  Will further evaluate the patient with basic labs including LFTs to evaluate for hepatobiliary pathology or pancreatitis.  Will obtain a CT scan of the patient's head and abdomen to eval for acute processes that could be causing his.  Will obtain a urinalysis here as well.  I have seen a few patients today with COVID-19 send will obtain a COVID swab to evaluate for viral etiologies.  Will reevaluate for ultimate  disposition.  Amount and/or Complexity of Data Reviewed Labs: ordered. Radiology: ordered.   ***  {Document critical care time when appropriate  Document review of labs and clinical decision tools ie CHADS2VASC2, etc  Document your independent review of radiology images and any outside records  Document your discussion with family members, caretakers and with consultants  Document social determinants of health affecting pt's care  Document your decision making why or why not admission, treatments were needed:32947:::1}   Final diagnoses:  None    ED Discharge Orders     None

## 2024-08-21 NOTE — ED Provider Notes (Signed)
 Patient care for this patient.  89 year old female here today as she has been a bit more weak and confused at baseline than normal.  Patient was send obtain urinalysis.  She does have a nitrate positive UTI.  Will provide her with some fosfomycin here in the ED.  Family feels comfortable taking the patient home.  Will discharge.   Mannie Pac T, DO 08/21/24 1710

## 2024-08-22 ENCOUNTER — Ambulatory Visit

## 2024-08-27 ENCOUNTER — Encounter

## 2024-08-27 ENCOUNTER — Ambulatory Visit

## 2024-08-29 ENCOUNTER — Ambulatory Visit: Admitting: Physical Therapy

## 2024-09-03 ENCOUNTER — Encounter

## 2024-09-03 ENCOUNTER — Ambulatory Visit

## 2024-09-05 ENCOUNTER — Ambulatory Visit: Admitting: Physical Therapy

## 2024-09-10 ENCOUNTER — Ambulatory Visit

## 2024-09-10 ENCOUNTER — Encounter

## 2024-09-12 ENCOUNTER — Ambulatory Visit

## 2024-09-17 ENCOUNTER — Ambulatory Visit

## 2024-09-17 ENCOUNTER — Encounter

## 2024-09-19 ENCOUNTER — Ambulatory Visit

## 2024-09-24 ENCOUNTER — Encounter

## 2024-09-24 ENCOUNTER — Ambulatory Visit

## 2024-09-26 ENCOUNTER — Ambulatory Visit

## 2024-10-01 ENCOUNTER — Ambulatory Visit: Admitting: Physical Therapy

## 2024-10-01 ENCOUNTER — Encounter

## 2024-10-03 ENCOUNTER — Ambulatory Visit

## 2024-10-08 ENCOUNTER — Ambulatory Visit

## 2024-10-08 ENCOUNTER — Encounter

## 2024-10-10 ENCOUNTER — Ambulatory Visit

## 2024-10-15 ENCOUNTER — Ambulatory Visit

## 2024-10-15 ENCOUNTER — Encounter

## 2024-10-17 ENCOUNTER — Ambulatory Visit

## 2024-10-22 ENCOUNTER — Encounter

## 2024-10-22 ENCOUNTER — Ambulatory Visit

## 2024-10-24 ENCOUNTER — Ambulatory Visit

## 2024-10-29 ENCOUNTER — Ambulatory Visit

## 2024-10-31 ENCOUNTER — Ambulatory Visit

## 2024-11-05 ENCOUNTER — Ambulatory Visit

## 2024-11-07 ENCOUNTER — Ambulatory Visit

## 2024-11-12 ENCOUNTER — Ambulatory Visit

## 2024-11-14 ENCOUNTER — Ambulatory Visit

## 2024-11-19 ENCOUNTER — Ambulatory Visit

## 2024-11-21 ENCOUNTER — Ambulatory Visit

## 2024-11-26 ENCOUNTER — Ambulatory Visit

## 2024-11-28 ENCOUNTER — Ambulatory Visit

## 2024-12-03 ENCOUNTER — Ambulatory Visit

## 2024-12-05 ENCOUNTER — Ambulatory Visit

## 2024-12-10 ENCOUNTER — Ambulatory Visit

## 2024-12-12 ENCOUNTER — Ambulatory Visit

## 2024-12-17 ENCOUNTER — Ambulatory Visit

## 2024-12-19 ENCOUNTER — Ambulatory Visit

## 2024-12-24 ENCOUNTER — Ambulatory Visit
